# Patient Record
Sex: Male | Born: 1945 | Race: White | Hispanic: No | Smoking: Former smoker
Health system: Southern US, Community
[De-identification: ages and names within clinical notes are randomized; demographics above are authoritative.]

## PROBLEM LIST (undated history)

## (undated) DIAGNOSIS — I714 Abdominal aortic aneurysm, without rupture: Secondary | ICD-10-CM

## (undated) DIAGNOSIS — I2510 Atherosclerotic heart disease of native coronary artery without angina pectoris: Secondary | ICD-10-CM

## (undated) DIAGNOSIS — I219 Acute myocardial infarction, unspecified: Secondary | ICD-10-CM

## (undated) DIAGNOSIS — K589 Irritable bowel syndrome without diarrhea: Secondary | ICD-10-CM

## (undated) DIAGNOSIS — K219 Gastro-esophageal reflux disease without esophagitis: Secondary | ICD-10-CM

## (undated) DIAGNOSIS — J189 Pneumonia, unspecified organism: Secondary | ICD-10-CM

## (undated) DIAGNOSIS — M1990 Unspecified osteoarthritis, unspecified site: Secondary | ICD-10-CM

## (undated) DIAGNOSIS — J61 Pneumoconiosis due to asbestos and other mineral fibers: Secondary | ICD-10-CM

## (undated) DIAGNOSIS — I10 Essential (primary) hypertension: Secondary | ICD-10-CM

## (undated) DIAGNOSIS — E785 Hyperlipidemia, unspecified: Secondary | ICD-10-CM

## (undated) DIAGNOSIS — R0602 Shortness of breath: Secondary | ICD-10-CM

## (undated) DIAGNOSIS — H409 Unspecified glaucoma: Secondary | ICD-10-CM

## (undated) DIAGNOSIS — G9050 Complex regional pain syndrome I, unspecified: Secondary | ICD-10-CM

## (undated) DIAGNOSIS — K5792 Diverticulitis of intestine, part unspecified, without perforation or abscess without bleeding: Secondary | ICD-10-CM

## (undated) DIAGNOSIS — G90522 Complex regional pain syndrome I of left lower limb: Secondary | ICD-10-CM

## (undated) DIAGNOSIS — I739 Peripheral vascular disease, unspecified: Secondary | ICD-10-CM

## (undated) DIAGNOSIS — K5790 Diverticulosis of intestine, part unspecified, without perforation or abscess without bleeding: Secondary | ICD-10-CM

## (undated) DIAGNOSIS — F329 Major depressive disorder, single episode, unspecified: Secondary | ICD-10-CM

## (undated) DIAGNOSIS — C801 Malignant (primary) neoplasm, unspecified: Secondary | ICD-10-CM

## (undated) DIAGNOSIS — F32A Depression, unspecified: Secondary | ICD-10-CM

## (undated) DIAGNOSIS — K469 Unspecified abdominal hernia without obstruction or gangrene: Secondary | ICD-10-CM

## (undated) HISTORY — DX: Diverticulitis of intestine, part unspecified, without perforation or abscess without bleeding: K57.92

## (undated) HISTORY — DX: Unspecified osteoarthritis, unspecified site: M19.90

## (undated) HISTORY — DX: Depression, unspecified: F32.A

## (undated) HISTORY — PX: OTHER SURGICAL HISTORY: SHX169

## (undated) HISTORY — DX: Diverticulosis of intestine, part unspecified, without perforation or abscess without bleeding: K57.90

## (undated) HISTORY — DX: Major depressive disorder, single episode, unspecified: F32.9

## (undated) HISTORY — DX: Malignant (primary) neoplasm, unspecified: C80.1

## (undated) HISTORY — PX: CORONARY ANGIOPLASTY: SHX604

## (undated) HISTORY — PX: NISSEN FUNDOPLICATION: SHX2091

## (undated) HISTORY — DX: Complex regional pain syndrome I, unspecified: G90.50

## (undated) HISTORY — DX: Shortness of breath: R06.02

## (undated) HISTORY — DX: Gastro-esophageal reflux disease without esophagitis: K21.9

## (undated) HISTORY — PX: CARDIAC CATHETERIZATION: SHX172

## (undated) HISTORY — DX: Unspecified abdominal hernia without obstruction or gangrene: K46.9

## (undated) HISTORY — DX: Pneumoconiosis due to asbestos and other mineral fibers: J61

## (undated) HISTORY — PX: CATARACT EXTRACTION: SUR2

## (undated) HISTORY — DX: Unspecified glaucoma: H40.9

## (undated) HISTORY — PX: HERNIA REPAIR: SHX51

## (undated) HISTORY — DX: Complex regional pain syndrome i of left lower limb: G90.522

---

## 1994-10-07 ENCOUNTER — Encounter: Payer: Self-pay | Admitting: Internal Medicine

## 1998-04-12 ENCOUNTER — Ambulatory Visit (HOSPITAL_COMMUNITY): Admission: RE | Admit: 1998-04-12 | Discharge: 1998-04-12 | Payer: Self-pay | Admitting: Orthopedic Surgery

## 1998-05-01 ENCOUNTER — Encounter: Admission: RE | Admit: 1998-05-01 | Discharge: 1998-06-21 | Payer: Self-pay | Admitting: Orthopedic Surgery

## 1998-07-30 ENCOUNTER — Encounter: Admission: RE | Admit: 1998-07-30 | Discharge: 1998-10-28 | Payer: Self-pay | Admitting: Anesthesiology

## 1998-07-31 ENCOUNTER — Encounter: Payer: Self-pay | Admitting: Anesthesiology

## 1998-08-15 ENCOUNTER — Encounter: Admission: RE | Admit: 1998-08-15 | Discharge: 1998-11-13 | Payer: Self-pay | Admitting: Anesthesiology

## 2003-11-02 ENCOUNTER — Ambulatory Visit (HOSPITAL_COMMUNITY): Admission: RE | Admit: 2003-11-02 | Discharge: 2003-11-02 | Payer: Self-pay | Admitting: Internal Medicine

## 2004-09-24 ENCOUNTER — Ambulatory Visit: Payer: Self-pay | Admitting: Internal Medicine

## 2004-09-27 ENCOUNTER — Ambulatory Visit: Payer: Self-pay | Admitting: Internal Medicine

## 2004-10-03 ENCOUNTER — Ambulatory Visit: Payer: Self-pay | Admitting: Internal Medicine

## 2005-04-21 ENCOUNTER — Ambulatory Visit: Payer: Self-pay | Admitting: Internal Medicine

## 2005-11-20 ENCOUNTER — Ambulatory Visit: Payer: Self-pay | Admitting: Internal Medicine

## 2006-02-17 ENCOUNTER — Ambulatory Visit: Payer: Self-pay | Admitting: Internal Medicine

## 2006-02-17 ENCOUNTER — Inpatient Hospital Stay (HOSPITAL_COMMUNITY): Admission: EM | Admit: 2006-02-17 | Discharge: 2006-03-02 | Payer: Self-pay | Admitting: Emergency Medicine

## 2006-02-18 ENCOUNTER — Encounter (INDEPENDENT_AMBULATORY_CARE_PROVIDER_SITE_OTHER): Payer: Self-pay | Admitting: Interventional Cardiology

## 2006-02-23 ENCOUNTER — Encounter (INDEPENDENT_AMBULATORY_CARE_PROVIDER_SITE_OTHER): Payer: Self-pay | Admitting: *Deleted

## 2006-03-12 ENCOUNTER — Encounter
Admission: RE | Admit: 2006-03-12 | Discharge: 2006-03-12 | Payer: Self-pay | Admitting: Thoracic Surgery (Cardiothoracic Vascular Surgery)

## 2006-06-18 ENCOUNTER — Ambulatory Visit: Payer: Self-pay | Admitting: Internal Medicine

## 2007-01-14 ENCOUNTER — Ambulatory Visit: Payer: Self-pay | Admitting: Internal Medicine

## 2007-03-11 ENCOUNTER — Ambulatory Visit: Payer: Self-pay | Admitting: Internal Medicine

## 2007-03-25 ENCOUNTER — Ambulatory Visit: Payer: Self-pay | Admitting: Internal Medicine

## 2007-05-29 DIAGNOSIS — G9050 Complex regional pain syndrome I, unspecified: Secondary | ICD-10-CM | POA: Insufficient documentation

## 2007-05-29 DIAGNOSIS — J45909 Unspecified asthma, uncomplicated: Secondary | ICD-10-CM | POA: Insufficient documentation

## 2007-05-29 DIAGNOSIS — K219 Gastro-esophageal reflux disease without esophagitis: Secondary | ICD-10-CM | POA: Insufficient documentation

## 2007-08-12 DIAGNOSIS — J449 Chronic obstructive pulmonary disease, unspecified: Secondary | ICD-10-CM

## 2007-08-12 DIAGNOSIS — J4489 Other specified chronic obstructive pulmonary disease: Secondary | ICD-10-CM | POA: Insufficient documentation

## 2007-08-12 DIAGNOSIS — J61 Pneumoconiosis due to asbestos and other mineral fibers: Secondary | ICD-10-CM | POA: Insufficient documentation

## 2007-08-19 ENCOUNTER — Encounter: Payer: Self-pay | Admitting: Internal Medicine

## 2007-08-23 ENCOUNTER — Ambulatory Visit: Payer: Self-pay | Admitting: Internal Medicine

## 2007-08-23 DIAGNOSIS — F17200 Nicotine dependence, unspecified, uncomplicated: Secondary | ICD-10-CM | POA: Insufficient documentation

## 2008-01-05 ENCOUNTER — Telehealth: Payer: Self-pay | Admitting: Internal Medicine

## 2008-09-27 ENCOUNTER — Encounter: Payer: Self-pay | Admitting: Internal Medicine

## 2008-12-05 ENCOUNTER — Ambulatory Visit: Payer: Self-pay | Admitting: Internal Medicine

## 2008-12-12 ENCOUNTER — Ambulatory Visit: Payer: Self-pay | Admitting: Internal Medicine

## 2008-12-12 DIAGNOSIS — R0609 Other forms of dyspnea: Secondary | ICD-10-CM | POA: Insufficient documentation

## 2008-12-12 DIAGNOSIS — R0989 Other specified symptoms and signs involving the circulatory and respiratory systems: Secondary | ICD-10-CM

## 2008-12-25 ENCOUNTER — Telehealth: Payer: Self-pay | Admitting: Internal Medicine

## 2009-06-18 ENCOUNTER — Ambulatory Visit: Payer: Self-pay | Admitting: Internal Medicine

## 2009-10-08 ENCOUNTER — Telehealth: Payer: Self-pay | Admitting: Internal Medicine

## 2009-10-12 ENCOUNTER — Telehealth (INDEPENDENT_AMBULATORY_CARE_PROVIDER_SITE_OTHER): Payer: Self-pay | Admitting: *Deleted

## 2009-12-19 ENCOUNTER — Ambulatory Visit (HOSPITAL_COMMUNITY): Admission: RE | Admit: 2009-12-19 | Discharge: 2009-12-19 | Payer: Self-pay | Admitting: Family Medicine

## 2009-12-19 ENCOUNTER — Inpatient Hospital Stay (HOSPITAL_COMMUNITY): Admission: EM | Admit: 2009-12-19 | Discharge: 2009-12-25 | Payer: Self-pay | Admitting: Emergency Medicine

## 2009-12-24 ENCOUNTER — Telehealth: Payer: Self-pay | Admitting: Internal Medicine

## 2010-01-17 ENCOUNTER — Ambulatory Visit: Payer: Self-pay | Admitting: Internal Medicine

## 2010-01-20 LAB — CONVERTED CEMR LAB
CO2: 30 meq/L (ref 19–32)
Calcium: 9.6 mg/dL (ref 8.4–10.5)
Creatinine, Ser: 1.4 mg/dL (ref 0.4–1.5)
Sodium: 136 meq/L (ref 135–145)

## 2010-01-21 ENCOUNTER — Ambulatory Visit: Payer: Self-pay | Admitting: Internal Medicine

## 2010-01-28 ENCOUNTER — Telehealth (INDEPENDENT_AMBULATORY_CARE_PROVIDER_SITE_OTHER): Payer: Self-pay | Admitting: *Deleted

## 2010-03-27 ENCOUNTER — Telehealth: Payer: Self-pay | Admitting: Internal Medicine

## 2010-04-01 ENCOUNTER — Telehealth: Payer: Self-pay | Admitting: Internal Medicine

## 2010-04-23 ENCOUNTER — Ambulatory Visit: Payer: Self-pay | Admitting: Internal Medicine

## 2010-05-09 ENCOUNTER — Ambulatory Visit: Admit: 2010-05-09 | Payer: Self-pay | Admitting: Internal Medicine

## 2010-06-04 NOTE — Progress Notes (Signed)
  Phone Note Other Incoming   Request: Send information Summary of Call: Request for records received from [employer]. Request forwarded to [employer].     

## 2010-06-04 NOTE — Procedures (Signed)
Summary: EGD   EGD  Procedure date:  10/03/2004  Findings:      Location: [facility]   Patient Name: Curtis [name], Curtis [name] MRN:  Procedure Procedures: Panendoscopy (EGD) CPT: 43235.    with biopsy(s)/brushing(s). CPT: D1846139.  Personnel: Endoscopist: Ralpheal Zappone L. Juanda Chance, MD.  Referred By: Benedetto Goad, MD.  Exam Location: Exam performed in Outpatient Clinic. Outpatient  Patient Consent: Procedure, Alternatives, Risks and Benefits discussed, consent obtained, from patient. Consent was obtained by the RN.  Indications Symptoms: Dysphagia. Reflux symptoms for <1 yr,  Surveillance of: 65.  History  Current Medications: Patient is not currently taking Coumadin.  Pre-Exam Physical: Performed Oct 03, 2004  Entire physical exam was normal.  Exam Exam Info: Maximum depth of insertion Duodenum, intended Duodenum. Vocal cords visualized. Gastric retroflexion performed. ASA Classification: I. Tolerance: good.  Sedation Meds: Patient assessed and found to be appropriate for moderate (conscious) sedation. Fentanyl 100 mcg. given IV. Versed 12 given IV. Cetacaine Spray 2 sprays given aerosolized.  Monitoring: BP and pulse monitoring done. Oximetry used. Supplemental O2 given  Findings - PRIOR SURGERY: Distal Esophagus. Anti-Reflux Surgery. Comments: functioninf Nissen Fundoplication, no obvious stricture.  - Dilation: Body. Maloney dilator used, Diameter: 54 mm, Minimal Resistance, No Heme present on extraction. 1  total dilators used. Patient tolerance fair. Outcome: successful.  - FOREIGN BODY / RETAINED FOOD: found in Body. Comments: bile retention.  DIAGNOSTIC TEST: from Antrum. RUT done, results pending   Assessment Abnormal examination, see findings above.  Comments: s/p prior Nissen Fundoplication, s/p passage of 63F Maloney dilator Events  Unplanned Intervention: No unplanned interventions were required.  Unplanned Events: There were no  complications. Plans Medication(s): PPI: Omeprazole/Prilosec 20 mg QAM, starting Oct 03, 2004   Disposition: After procedure patient sent to recovery. After recovery patient sent home.   This report was created from the original endoscopy report, which was reviewed and signed by the above listed endoscopist.    RUT NEGATIVE

## 2010-06-04 NOTE — Progress Notes (Signed)
Summary: REC COL  Phone Note Outgoing Call Call back at [facility] Phone [phone]   Call placed by: Vallarie Mare,  March 27, 2010 10:05 AM Call placed to: Patient Summary of Call: pt was called at home number on 11/14, 11/15, and again on 11/23... detailed voicemails where left all three times for pt to c/b and sch REC COL... pt's contact (son) was also called on 11/15 and a detailed voicemail was left to have pt c/b and sch REC COL. However, there has been no return call. Initial call taken by: Vallarie Mare,  March 27, 2010 10:07 AM  Follow-up for Phone Call        I will forward note to Dr Juanda Chance so she will be aware. Follow-up by: Lamona Curl CMA Duncan Dull),  March 27, 2010 10:25 AM  Additional Follow-up for Phone Call Additional follow up Details #1::        OK, he will call us eventually Additional Follow-up by: Hart Carwin MD,  March 27, 2010 2:11 PM

## 2010-06-04 NOTE — Progress Notes (Signed)
Summary: colonoscopy  Phone Note Call from Patient Call back at Home Phone [phone]   Caller: Patient Call For: Dr. Juanda Chance Reason for Call: Talk to Nurse Details for Reason: Colonoscopy Summary of Call: Pt. called and scheduled his colonoscopy and previsit for January. If you have questions or you feel he needs to do it sooner, please call him. Initial call taken by: Schuyler Amor,  April 01, 2010 10:37 AM  Follow-up for Phone Call        noted. Follow-up by: Lamona Curl CMA Duncan Dull),  April 01, 2010 10:47 AM

## 2010-06-04 NOTE — Assessment & Plan Note (Signed)
Summary: REFILL OMEPRAZOLE...AS.   History of Present Illness Visit Type: Follow-up Visit Primary GI MD: Lina Sar MD Primary Provider: Benedetto Goad, MD  Requesting Provider: n/a Chief Complaint: Omeprazole refiiled. Pt c/o diarrhea  History of Present Illness:   This is a 65 year old white male with gastroesophageal reflux disease who comes for refills of omeprazole 20 mg daily. He underwent a Nissen fundoplication in 1994 as well as a cholecystectomy. His last upper endoscopy in 2006 showed a functioning Nissen fundoplication. He was dilated with a 54 [place] dilator. 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 His last colonoscopy in June 1996 showed diverticulosis of the left colon. Patient has been treated for irritable bowel syndrome with predominant diarrhea. He has been diagnosed with asbestosis and reflex sympathetic dystrophy. He has a family history of colon cancer in his mother.   GI Review of Systems      Denies abdominal pain, acid reflux, belching, bloating, chest pain, dysphagia with liquids, dysphagia with solids, heartburn, loss of appetite, nausea, vomiting, vomiting blood, weight loss, and  weight gain.      Reports diarrhea.     Denies anal fissure, black tarry stools, change in bowel habit, constipation, diverticulosis, fecal incontinence, heme positive stool, hemorrhoids, irritable bowel syndrome, jaundice, light color stool, liver problems, rectal bleeding, and  rectal pain.    Current Medications (verified): 1)  Bayer Aspirin Ec Low Dose 81 Mg  Tbec (Aspirin) .... Take 1 Tablet By Mouth Once A Day 2)  Amitriptyline Hcl 50 Mg  Tabs (Amitriptyline Hcl) .... Take 1 Tablet By Mouth Four Times A Day 3)  Alprazolam 2 Mg Tabs (Alprazolam) .... One Tablet By Mouth Three Times A Day 4)  Omeprazole 20 Mg  Cpdr (Omeprazole) .... Take 1 Tablet By Mouth Once A Day. Need Office Visit For Any Further Refills! 5)  Gabapentin 300 Mg  Tabs (Gabapentin) .... Take 1 Tablet By Mouth Three Times A  Day Every Monday, Wednesday and Friday 6)  Flomax 0.4 Mg  Cp24 (Tamsulosin Hcl) .... Take 1 Capsule By Mouth Once A Day 7)  Metoprolol Tartrate 50 Mg  Tabs (Metoprolol Tartrate) .... One Tablet Two Times Daily 8)  Finasteride 5 Mg  Tabs (Finasteride) .... Take 1 Tablet By Mouth Once A Day  Allergies (verified): 1)  ! Codeine  Past History:  Past Medical History: Reviewed history from 05/29/2007 and no changes required. 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 NIssen Fundoplication Right Elbow Surgery x 2 Right Knee arthroscopy Left shoulder tumor x 3 Squamous cell skin cancer- left hand  Family History: Reviewed history from 11/15/2008 and no changes required. Family History of Colon Cancer: Mother, 10 maternal aunts and uncles  Social History: Reviewed history from 12/05/2008 and no changes required. Patient states former smoker. ( almost 50 pk yrs) Alcohol Use - yes-occasional Illicit Drug Use - no  Review of Systems  The patient denies allergy/sinus, anemia, anxiety-new, arthritis/joint pain, back pain, blood in urine, breast changes/lumps, change in vision, confusion, cough, coughing up blood, depression-new, fainting, fatigue, fever, headaches-new, hearing problems, heart murmur, heart rhythm changes, itching, muscle pains/cramps, night sweats, nosebleeds, shortness of breath, skin rash, sleeping problems, sore throat, swelling of feet/legs, swollen lymph glands, thirst - excessive, urination - excessive, urination changes/pain, urine leakage, vision changes, and voice change.         Pertinent positive and negative review of systems were noted  in the above HPI. All other ROS was otherwise negative.   Vital Signs:  Patient profile:   65 year old male Height:      69 inches Weight:      201  pounds BMI:     29.79 BSA:     2.07 Pulse rate:   68 / minute Pulse rhythm:   regular BP sitting:   136 / 74  (left arm) Cuff size:   regular  Vitals Entered By: Ok Anis CMA (June 18, 2009 4:59 PM)  Physical Exam  General:  Well developed, well nourished, no acute distress. Eyes:  PERRLA, no icterus. Mouth:  No deformity or lesions, dentition normal. Neck:  Supple; no masses or thyromegaly. Lungs:  Clear throughout to auscultation. Heart:  Regular rate and rhythm; no murmurs, rubs,  or bruits. Abdomen:  protuberant abdomen with normal active bowel sounds, mild tenderness right upper quadrant and epigastrium, lower abdomen is normal, liver edge at costal margin. Well-healed vertical scar in the upper abdomen. Extremities:  No clubbing, cyanosis, edema or deformities noted. Skin:  status post excision of a skin cancer of the left hand on the dorsum.   Impression & Recommendations:  Problem # 1:  Family Hx of COLON CANCER (ICD-153.9) Patient is due for a recall colonoscopy but he is not on ready to schedule it today.We will send recall letter in 6 months.  Problem # 2:  GERD (ICD-530.81) controlled on Prilosec 20 mg daily. We will refill x 1 year.  Patient Instructions: 1)  omeprazole 20 mg by mouth once daily #90 with 3 refills. 2)  The medication list was reviewed and reconciled.  All changed / newly prescribed medications were explained.  A complete medication list was provided to the patient / caregiver. 3)  recall for colonoscopy in 6 months Prescriptions: OMEPRAZOLE 20 MG  CPDR (OMEPRAZOLE) Take 1 tablet by mouth once a day.  #90 x 3   Entered by:   Hortense Ramal CMA (AAMA)   Authorized by:   Hart Carwin MD   Signed by:   Hortense Ramal CMA (AAMA) on 06/18/2009   Method used:   Electronically to        [facility]* (mail-order)       [address]       [place], [place]  [address]       Ph: [phone]       Fax: [phone]   RxID:    [id number]

## 2010-06-04 NOTE — Assessment & Plan Note (Signed)
Summary: rov ///kp   Copy to:  n/a Primary Provider/Referring Provider:  Benedetto Goad, MD   CC:  Follow up visit-Bronchitis(not much endurance anymore)..  History of Present Illness: His pulmonary function tests are reviewed today.  There is mild to moderate restriction with a total lung capacity 65% of predicted.  He is not air trapping.  Spirometry flows are reduced parallel to the restriction due to obstructive airways disease as confirmed by disproportionate slowing in small airway flows.  There is no significant response to bronchodilator.  Diffusion capacity is mildly reduced at 65%.  12/05/08- Asbestos plaques Had squamous cell skin cancer removed from left hand. Breathing ok at rest and asleep. rarely coughs up some mucus plugs. Denies blood, adenopathy or chest pain. His [employer] suggested cxr and PFT.  Notes dyspnea with exertion still- not progressive and he paces himself.  We discussed radiation, cost and information yield comparing CT with CXR' Quit smoking 3 years ago.  January 17, 2010- Asbestos plaques Says his [employer] attorneys don't have documentation to say he has asbestos disease. Hosp for diverticulitis resolved with antibiotics. Breathing stable. Gives out easily walking.  CXR- 2010 - Scarring on right. Bilateral interstitial disease c/w asbestos was not seen. PFT 2010 Moderate obstructive and restrictive disease w/o response to bronchodilator. 6 MWT 2010- 94%, 96%, 97% - limited by arthritis pain in legs.      Preventive Screening-Counseling & Management  Alcohol-Tobacco     Smoking Status: quit     Year Quit: 2007     Pack years: 50 years  2 1/2 packs daily  Current Medications (verified): 1)  Bayer Aspirin Ec Low Dose 81 Mg  Tbec (Aspirin) .... Take 1 Tablet By Mouth Once A Day 2)  Amitriptyline Hcl 50 Mg  Tabs (Amitriptyline Hcl) .... Take 1 Tablet By Mouth Four Times A Day 3)  Alprazolam 2 Mg Tabs (Alprazolam) .... One Tablet By  Mouth Three Times A Day 4)  Omeprazole 20 Mg  Cpdr (Omeprazole) .... Take 1 Tablet By Mouth Once A Day. 5)  Neurontin 300 Mg Caps (Gabapentin) .... Take 1 By Mouth Two Times A Day 6)  Flomax 0.4 Mg  Cp24 (Tamsulosin Hcl) .... Take 1 Capsule By Mouth Once A Day 7)  Metoprolol Tartrate 50 Mg  Tabs (Metoprolol Tartrate) .... One Tablet Two Times Daily 8)  Finasteride 5 Mg  Tabs (Finasteride) .... Take 1 Tablet By Mouth Once A Day 9)  Ra Vitamin E 4000 Unit Oil (Vitamin E) .... Once Daily 10)  Tylenol Arthritis Pain 650 Mg Cr-Tabs (Acetaminophen) .... Take 1 By Mouth Every 4 Hours As Needed 11)  Amlodipine Besylate 10 Mg Tabs (Amlodipine Besylate) .... Take 1 By Mouth Once Daily 12)  Hydrocodone-Acetaminophen 5-325 Mg Tabs (Hydrocodone-Acetaminophen) .... Take 1-2 By Mouth Every 6 Hours As Needed  Allergies (verified): 1)  ! 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  The patient denies anorexia, fever, weight loss, weight gain, vision loss, decreased hearing, hoarseness, chest pain, syncope, peripheral edema, prolonged cough, headaches, hemoptysis, abdominal pain, enlarged lymph nodes, and angioedema.    Vital Signs:  Patient profile:   65 year  old male Height:      69 inches Weight:      180.50 pounds BMI:     26.75 O2 Sat:      95 % on Room air Pulse rate:   75 / minute BP sitting:   132 / 74  (left arm) Cuff size:   regular  Vitals Entered By: Reynaldo Minium CMA (January 17, 2010 3:49 PM)  O2 Flow:  Room air CC: Follow up visit-Bronchitis(not much endurance anymore).   Physical Exam  Additional Exam:  General: A/Ox3; pleasant and cooperative, NAD, SKIN: no rash, lesions NODES: no lymphadenopathy HEENT: [place]/AT, EOM- WNL,  Conjuctivae- clear, PERRLA, TM-WNL, Nose- clear, Throat- clear and wnl NECK: Supple w/ fair ROM, JVD- none, normal carotid impulses w/o bruits Thyroid-  CHEST: Diminished with poor basilar airflow, fine crackles in L>R lower zones. No rub or dullness HEART: RRR, no m/g/r heard ABDOMEN: Soft and nl;  ZOX:WRUE, nl pulses, no edema  NEURO: Grossly intact to observation, slow talking, calm and cooperative      CXR  Procedure date:  12/05/2008  Findings:       Comparison: 03/12/2006   Findings: Heart size is normal.   No pleural effusions or interstitial edema noted.   Extensive pleural and parenchymal changes involving the right lung base is again noted and appears unchanged.   No superimposed airspace consolidation identified.  There is interstitial change of COPD.   IMPRESSION:   1.  No change in pleural parenchymal scarring on the right. 2.  No acute cardiopulmonary abnormalities.   CXR  Procedure date:  12/05/2008  Findings:      DG CHEST 2 VIEW - [id number]   Clinical Data: History of asbestos exposure and COPD   CHEST - 2 VIEW   Comparison: 03/12/2006   Findings: Heart size is normal.   No pleural effusions or interstitial edema noted.   Extensive pleural and parenchymal changes involving the right lung base is again noted and appears unchanged.   No superimposed airspace consolidation identified.  There is interstitial change of COPD.   IMPRESSION:   1.  No change in pleural parenchymal scarring on the right. 2.  No acute cardiopulmonary abnormalities.   Read By:    Impression & Recommendations:  Problem # 1:  HISTORY OF ASBESTOS EXPOSURE (ICD-V15.84)  Asymmetrical scarring just in right lung suggests a post pneumonia scarring. I would expect asbestos exposure to be more symmetrical. He had hx double pneumonia in [employer], 1966. We will get CT chest trying to clarify pattern consistent with asbestos disease. He was outspoken today with concern  about the status of his asbestos claim.  Problem # 2:  COPD (ICD-496) A component of his lung disease has to reflect his sustained heavy smoking, but there is no way to separate out the proportions due to COPD vs pneumonia vs asbestosis if any. CT may help.  Medications Added to Medication List This Visit: 1)  Neurontin 300 Mg Caps (Gabapentin) .... Take 1 by mouth two times a day 2)  Ra Vitamin E 4000 Unit Oil (Vitamin e) .... Once daily 3)  Tylenol Arthritis Pain 650 Mg Cr-tabs (Acetaminophen) .... Take 1 by mouth every 4 hours as needed 4)  Amlodipine Besylate 10 Mg Tabs (Amlodipine besylate) .... Take 1 by mouth once daily 5)  Hydrocodone-acetaminophen 5-325 Mg Tabs (Hydrocodone-acetaminophen) .... Take 1-2 by mouth every 6 hours as needed  Other Orders: Est. Patient Level IV (91478) TLB-BMP (Basic Metabolic Panel-BMET) (80048-METABOL) Radiology Referral (Radiology)  Patient Instructions: 1)  Please schedule a follow-up appointment in 2 months. 2)  A Chest CT with Contrast has been recommended.  Your imaging study may require preauthorization.  3)  Lab   CXR  Procedure date:  12/05/2008  Findings:       Comparison: 03/12/2006   Findings: Heart size is normal.   No pleural effusions or interstitial edema noted.   Extensive pleural and parenchymal changes involving the right lung base is again noted and appears unchanged.   No superimposed airspace consolidation identified.  There is interstitial change of COPD.   IMPRESSION:   1.  No change in pleural parenchymal scarring on the right. 2.  No acute cardiopulmonary abnormalities.   CXR  Procedure date:  12/05/2008  Findings:      DG CHEST 2 VIEW - [id number]   Clinical Data: History of asbestos exposure and COPD   CHEST - 2 VIEW   Comparison: 03/12/2006   Findings: Heart size is normal.   No pleural effusions or interstitial edema noted.   Extensive pleural and parenchymal changes involving the right  lung base is again noted and appears unchanged.   No superimposed airspace consolidation identified.  There is interstitial change of COPD.   IMPRESSION:   1.  No change in pleural parenchymal scarring on the right. 2.  No acute cardiopulmonary abnormalities.   Read By:

## 2010-06-04 NOTE — Progress Notes (Signed)
Summary: diagnosis/ copy req  Phone Note Call from Patient Call back at Home Phone [phone]   Caller: Patient Call For: young Summary of Call: pt requests a copy of the diagnosis re: "asbestosis". he says dr young diagnosed him in '07. needs this mailed to his home address.  Initial call taken by: Tivis Ringer, CNA,  October 08, 2009 9:40 AM  Follow-up for Phone Call        [facility] Yetta Barre RN  October 08, 2009 9:48 AM  pt staets he needs his records to be sent to lawyers [employer]. I asked if he had signed a release adn he stated no, so I advised he would need to sign a release form from medical records and have them collect his records to sedn to the lawyer. Pt states he will do so. Carron Curie CMA  October 09, 2009 3:01 PM

## 2010-06-04 NOTE — Progress Notes (Signed)
Summary: FYI- pt in hosp  Phone Note Call from Patient   Caller: Patient Call For: Broedy Osbourne Summary of Call: pt wanted me to let CDY know he was in hospital @ [facility]. Initial call taken by: Eugene Gavia,  December 24, 2009 9:07 AM  Follow-up for Phone Call        called and spoke with pt. pt states he wants CY to know he is at  [facility] 5th floor 1529 d/t IBS and diverticulitis.  Been at [facility] since Tuesday 12-18-2009.  Pt states he is unsure when he will be discharged.  Will forward message to CY as an FYI.  Arman Filter LPN  December 24, 2009 9:16 AM  Follow-up by: Waymon Budge MD,  December 24, 2009 1:45 PM  Additional Follow-up for Phone Call Additional follow up Details #1::        noted Additional Follow-up by: Waymon Budge MD,  December 24, 2009 1:45 PM

## 2010-06-12 ENCOUNTER — Ambulatory Visit: Payer: Self-pay | Admitting: Internal Medicine

## 2010-06-17 ENCOUNTER — Telehealth: Payer: Self-pay | Admitting: Internal Medicine

## 2010-06-18 ENCOUNTER — Encounter (INDEPENDENT_AMBULATORY_CARE_PROVIDER_SITE_OTHER): Payer: Self-pay | Admitting: *Deleted

## 2010-06-26 ENCOUNTER — Encounter (INDEPENDENT_AMBULATORY_CARE_PROVIDER_SITE_OTHER): Payer: Self-pay | Admitting: *Deleted

## 2010-06-26 NOTE — Progress Notes (Signed)
Summary: Omeprazole Rx/Needs Colonoscopy  Phone Note Outgoing Call   Call placed by: Lamona Curl CMA Duncan Dull),  June 17, 2010 5:57 PM Call placed to: Patient Summary of Call: I have advised patient that we have received his request for more omeprazole via mail. However, he is overdue for colonoscopy. He has a strong family history of colon cancer in his mother and patient has had difficulty with Diverticulitis as recent as August of this year. He was originally scheduled for colonoscopy but called and cancelled. I have explained to patient that we highly recommend him to have a colonoscopy due to his family history and personal history. Patient states "well I had a CT scan and they told me that there werent any other problems." I have explained that a CT scan would not show any colon polyps. I tried to explain the difference in what a CT would show vs what a colonoscopy would show. Patient continues to explain that his CT scan was okay. Patient states that he does not wish to have colonoscopy at this time. I have spoken with Dr Juanda Chance directly after conversation with patient. She states that it is still okay for patient to have omeprazole for reflux. Initial call taken by: Lamona Curl CMA (AAMA),  June 17, 2010 6:01 PM    Prescriptions: OMEPRAZOLE 20 MG  CPDR (OMEPRAZOLE) Take 1 tablet by mouth once a day.  #90 x 0   Entered by:   Lamona Curl CMA (AAMA)   Authorized by:   Hart Carwin MD   Signed by:   Lamona Curl CMA (AAMA) on 06/17/2010   Method used:   Electronically to        [facility]* (mail-order)       [address]       [place], [place]  [address]       Ph: [phone]       Fax: [phone]   RxID:   [id number]

## 2010-06-26 NOTE — Letter (Signed)
Summary: Pre Visit Letter Revised  [facility]  [address] [place], [place] [address]   Phone: [phone]  Fax: [phone]        06/18/2010 MRN: [id number] [facility] [address] [place], [place]  [address]             Procedure Date:  07-12-10   Welcome to the Gastroenterology Division at [facility].    You are scheduled to see a nurse for your pre-procedure visit on 06-28-10 at 1:30P.M. on the 3rd floor at [facility], [address].  We ask that you try to arrive at our office 15 minutes prior to your appointment time to allow for check-in.  Please take a minute to review the attached form.  If you answer "Yes" to one or more of the questions on the first page, we ask that you call the person listed at your earliest opportunity.  If you answer "No" to all of the questions, please complete the rest of the form and bring it to your appointment.    Your nurse visit will consist of discussing your medical and surgical history, your immediate family medical history, and your medications.   If you are unable to list all of your medications on the form, please bring the medication bottles to your appointment and we will list them.  We will need to be aware of both prescribed and over the counter drugs.  We will need to know exact dosage information as well.    Please be prepared to read and sign documents such as consent forms, a financial agreement, and acknowledgement forms.  If necessary, and with your consent, a friend or relative is welcome to sit-in on the nurse visit with you.  Please bring your insurance card so that we may make a copy of it.  If your insurance requires a referral to see a specialist, please bring your referral form from your primary care physician.  No co-pay is required for this nurse visit.     If you cannot keep your appointment, please call [phone] to cancel or reschedule prior to your appointment date.  This  allows [place] the opportunity to schedule an appointment for another patient in need of care.    Thank you for choosing  Gastroenterology for your medical needs.  We appreciate the opportunity to care for you.  Please visit [place] at our website  to learn more about our practice.  Sincerely, The Gastroenterology Division

## 2010-06-26 NOTE — Letter (Signed)
Summary: Pre Visit Letter Revised  [facility]  [address] [place], [place] [address]   Phone: [phone]  Fax: [phone]        06/18/2010 MRN: [id number] [facility] [address] [place], [place]  [address]             Procedure Date:  07-23-10   Welcome to the Gastroenterology Division at [facility].    You are scheduled to see a nurse for your pre-procedure visit on 06-28-10 at 1:30P.M. on the 3rd floor at [facility], [address]  We ask that you try to arrive at our office 15 minutes prior to your appointment time to allow for check-in.  Please take a minute to review the attached form.  If you answer "Yes" to one or more of the questions on the first page, we ask that you call the person listed at your earliest opportunity.  If you answer "No" to all of the questions, please complete the rest of the form and bring it to your appointment.    Your nurse visit will consist of discussing your medical and surgical history, your immediate family medical history, and your medications.   If you are unable to list all of your medications on the form, please bring the medication bottles to your appointment and we will list them.  We will need to be aware of both prescribed and over the counter drugs.  We will need to know exact dosage information as well.    Please be prepared to read and sign documents such as consent forms, a financial agreement, and acknowledgement forms.  If necessary, and with your consent, a friend or relative is welcome to sit-in on the nurse visit with you.  Please bring your insurance card so that we may make a copy of it.  If your insurance requires a referral to see a specialist, please bring your referral form from your primary care physician.  No co-pay is required for this nurse visit.     If you cannot keep your appointment, please call [phone] to cancel or reschedule prior to your appointment date.  This  allows [place] the opportunity to schedule an appointment for another patient in need of care.    Thank you for choosing [facility] for your medical needs.  We appreciate the opportunity to care for you.  Please visit [place] at our website  to learn more about our practice.  Sincerely, The Gastroenterology Division

## 2010-06-28 ENCOUNTER — Encounter: Payer: Self-pay | Admitting: Internal Medicine

## 2010-07-02 NOTE — Miscellaneous (Signed)
Summary: [facility] PV  Clinical Lists Changes  Medications: Added new medication of MOVIPREP 100 GM  SOLR (PEG-KCL-NACL-NASULF-NA ASC-C) As per prep instructions. - Signed Rx of MOVIPREP 100 GM  SOLR (PEG-KCL-NACL-NASULF-NA ASC-C) As per prep instructions.;  #1 x 0;  Signed;  Entered by: Ezra Sites RN;  Authorized by: Hart Carwin MD;  Method used: [facility]  [place] [address], [place], [place]  [address], Ph: [phone] or [phone], Fax: [phone] Observations: Added new observation of ALLERGY REV: Done (06/28/2010 12:30)    Prescriptions: MOVIPREP 100 GM  SOLR (PEG-KCL-NACL-NASULF-NA ASC-C) As per prep instructions.  #1 x 0   Entered by:   Ezra Sites RN   Authorized by:   Hart Carwin MD   Signed by:   Ezra Sites RN on 06/28/2010   Method used:   [facility]ically [facility]        [facility]  [place] [address] (retail)       [address]       [place], [place]  [address]       Ph: [phone] or [phone]       Fax: [phone]   RxID:   [id number]

## 2010-07-02 NOTE — Letter (Signed)
Summary: Moviprep Instructions  [facility]  [address]   [place], [place] [address]   Phone: [phone]  Fax: [phone]       Curtis [name]    May 31, 1945    MRN: [id number]        Procedure Day Dorna Bloom: Tuesday, 07-23-10     Arrival Time: 1:00 p.m.      Procedure Time: 2:00 p.m.     Location of Procedure:                    x  [facility] (4th Floor)  PREPARATION FOR COLONOSCOPY WITH MOVIPREP   Starting 5 days prior to your procedure 07-18-10 do not eat nuts, seeds, popcorn, corn, beans, peas,  salads, or any raw vegetables.  Do not take any fiber supplements (e.g. Metamucil, Citrucel, and Benefiber).  THE DAY BEFORE YOUR PROCEDURE         DATE: 07-22-10   DAY: Monday  1.  Drink clear liquids the entire day-NO SOLID FOOD  2.  Do not drink anything colored red or purple.  Avoid juices with pulp.  No orange juice.  3.  Drink at least 64 oz. (8 glasses) of fluid/clear liquids during the day to prevent dehydration and help the prep work efficiently.  CLEAR LIQUIDS INCLUDE: Water Jello Ice Popsicles Tea (sugar ok, no milk/cream) Powdered fruit flavored drinks Coffee (sugar ok, no milk/cream) Gatorade Juice: apple, white grape, white cranberry  Lemonade Clear bullion, consomm, broth Carbonated beverages (any kind) Strained chicken noodle soup Hard Candy                             4.  In the morning, mix first dose of MoviPrep solution:    Empty 1 Pouch A and 1 Pouch B into the disposable container    Add lukewarm drinking water to the top line of the container. Mix to dissolve    Refrigerate (mixed solution should be used within 24 hrs)  5.  Begin drinking the prep at 5:00 p.m. The MoviPrep container is divided by 4 marks.   Every 15 minutes drink the solution down to the next mark (approximately 8 oz) until the full liter is complete.   6.  Follow completed prep with 16 oz of clear liquid of your choice (Nothing red or purple).   Continue to drink clear liquids until bedtime.  7.  Before going to bed, mix second dose of MoviPrep solution:    Empty 1 Pouch A and 1 Pouch B into the disposable container    Add lukewarm drinking water to the top line of the container. Mix to dissolve    Refrigerate  THE DAY OF YOUR PROCEDURE      DATE: 07-23-10  DAY: Tuesday  Beginning at 9:00 a.m. (5 hours before procedure):         1. Every 15 minutes, drink the solution down to the next mark (approx 8 oz) until the full liter is complete.  2. Follow completed prep with 16 oz. of clear liquid of your choice.    3. You may drink clear liquids until 12:00 p.m. (2 HOURS BEFORE PROCEDURE).   MEDICATION INSTRUCTIONS  Unless otherwise instructed, you should take regular prescription medications with a small sip of water   as early as possible the morning of your procedure.        OTHER INSTRUCTIONS  You will need a responsible adult at least  66 years of age to accompany you and drive you home.   This person must remain in the waiting room during your procedure.  Wear loose fitting clothing that is easily removed.  Leave jewelry and other valuables at home.  However, you may wish to bring a book to read or  an iPod/MP3 player to listen to music as you wait for your procedure to start.  Remove all body piercing jewelry and leave at home.  Total time from sign-in until discharge is approximately 2-3 hours.  You should go home directly after your procedure and rest.  You can resume normal activities the  day after your procedure.  The day of your procedure you should not:   Drive   Make legal decisions   Operate machinery   Drink alcohol   Return to work  You will receive specific instructions about eating, activities and medications before you leave.    The above instructions have been reviewed and explained to me by   Ezra Sites RN  June 28, 2010 12:53 PM     I fully understand and can verbalize  these instructions _____________________________ Date _________

## 2010-07-11 ENCOUNTER — Encounter: Payer: Self-pay | Admitting: Internal Medicine

## 2010-07-11 ENCOUNTER — Ambulatory Visit (INDEPENDENT_AMBULATORY_CARE_PROVIDER_SITE_OTHER): Payer: Medicare Other | Admitting: Internal Medicine

## 2010-07-11 DIAGNOSIS — K219 Gastro-esophageal reflux disease without esophagitis: Secondary | ICD-10-CM

## 2010-07-11 DIAGNOSIS — J4489 Other specified chronic obstructive pulmonary disease: Secondary | ICD-10-CM

## 2010-07-11 DIAGNOSIS — J449 Chronic obstructive pulmonary disease, unspecified: Secondary | ICD-10-CM

## 2010-07-11 DIAGNOSIS — J42 Unspecified chronic bronchitis: Secondary | ICD-10-CM

## 2010-07-11 DIAGNOSIS — Z77090 Contact with and (suspected) exposure to asbestos: Secondary | ICD-10-CM

## 2010-07-12 ENCOUNTER — Other Ambulatory Visit: Payer: Self-pay | Admitting: Internal Medicine

## 2010-07-18 ENCOUNTER — Telehealth (INDEPENDENT_AMBULATORY_CARE_PROVIDER_SITE_OTHER): Payer: Self-pay | Admitting: *Deleted

## 2010-07-18 ENCOUNTER — Encounter: Payer: Self-pay | Admitting: Internal Medicine

## 2010-07-19 LAB — CBC
HCT: 30.1 % — ABNORMAL LOW (ref 39.0–52.0)
HCT: 31.6 % — ABNORMAL LOW (ref 39.0–52.0)
Hemoglobin: 10.4 g/dL — ABNORMAL LOW (ref 13.0–17.0)
Hemoglobin: 10.6 g/dL — ABNORMAL LOW (ref 13.0–17.0)
Hemoglobin: 10.7 g/dL — ABNORMAL LOW (ref 13.0–17.0)
MCH: 31 pg (ref 26.0–34.0)
MCH: 31 pg (ref 26.0–34.0)
MCH: 31.3 pg (ref 26.0–34.0)
MCHC: 34.3 g/dL (ref 30.0–36.0)
MCHC: 34.5 g/dL (ref 30.0–36.0)
MCHC: 35.1 g/dL (ref 30.0–36.0)
MCV: 88.7 fL (ref 78.0–100.0)
MCV: 89.9 fL (ref 78.0–100.0)
MCV: 90 fL (ref 78.0–100.0)
MCV: 90.5 fL (ref 78.0–100.0)
Platelets: 313 10*3/uL (ref 150–400)
Platelets: 323 10*3/uL (ref 150–400)
Platelets: 325 10*3/uL (ref 150–400)
RBC: 3.41 MIL/uL — ABNORMAL LOW (ref 4.22–5.81)
RBC: 3.49 MIL/uL — ABNORMAL LOW (ref 4.22–5.81)
RDW: 13.5 % (ref 11.5–15.5)
RDW: 13.7 % (ref 11.5–15.5)
RDW: 14 % (ref 11.5–15.5)
RDW: 14.1 % (ref 11.5–15.5)
WBC: 6.1 10*3/uL (ref 4.0–10.5)
WBC: 6.2 10*3/uL (ref 4.0–10.5)
WBC: 6.8 10*3/uL (ref 4.0–10.5)

## 2010-07-19 LAB — BASIC METABOLIC PANEL
BUN: 8 mg/dL (ref 6–23)
CO2: 24 mEq/L (ref 19–32)
CO2: 28 mEq/L (ref 19–32)
Calcium: 8.5 mg/dL (ref 8.4–10.5)
Calcium: 8.6 mg/dL (ref 8.4–10.5)
Calcium: 9 mg/dL (ref 8.4–10.5)
Chloride: 104 mEq/L (ref 96–112)
Creatinine, Ser: 1.29 mg/dL (ref 0.4–1.5)
GFR calc Af Amer: >60 mL/min (ref >60)
GFR calc non Af Amer: 54 mL/min — ABNORMAL LOW (ref >60)
GFR calc non Af Amer: 56 mL/min — ABNORMAL LOW (ref >60)
Glucose, Bld: 109 mg/dL — ABNORMAL HIGH (ref 70–99)
Glucose, Bld: 116 mg/dL — ABNORMAL HIGH (ref 70–99)
Potassium: 4.7 mEq/L (ref 3.5–5.1)
Sodium: 137 mEq/L (ref 135–145)
Sodium: 137 mEq/L (ref 135–145)

## 2010-07-19 LAB — URINALYSIS, ROUTINE W REFLEX MICROSCOPIC
Bilirubin Urine: NEGATIVE
Nitrite: NEGATIVE
Specific Gravity, Urine: 1.022 (ref 1.005–1.030)
pH: 6 (ref 5.0–8.0)

## 2010-07-19 LAB — CULTURE, BLOOD (ROUTINE X 2): Culture: NO GROWTH

## 2010-07-19 LAB — DIFFERENTIAL
Basophils Absolute: 0.2 10*3/uL — ABNORMAL HIGH (ref 0.0–0.1)
Basophils Relative: 1 % (ref 0–1)
Eosinophils Absolute: 0.3 10*3/uL (ref 0.0–0.7)
Eosinophils Relative: 4 % (ref 0–5)
Lymphocytes Relative: 14 % (ref 12–46)
Monocytes Absolute: 0.8 10*3/uL (ref 0.1–1.0)
Monocytes Absolute: 0.9 10*3/uL (ref 0.1–1.0)
Monocytes Relative: 11 % (ref 3–12)
Monocytes Relative: 11 % (ref 3–12)
Neutro Abs: 5.8 10*3/uL (ref 1.7–7.7)

## 2010-07-19 LAB — CLOSTRIDIUM DIFFICILE EIA: C difficile Toxins A+B, EIA: NEGATIVE

## 2010-07-19 LAB — COMPREHENSIVE METABOLIC PANEL
Albumin: 3 g/dL — ABNORMAL LOW (ref 3.5–5.2)
Albumin: 3.6 g/dL (ref 3.5–5.2)
Alkaline Phosphatase: 69 U/L (ref 39–117)
BUN: 16 mg/dL (ref 6–23)
BUN: 7 mg/dL (ref 6–23)
Calcium: 8.6 mg/dL (ref 8.4–10.5)
Calcium: 9.4 mg/dL (ref 8.4–10.5)
Creatinine, Ser: 1.49 mg/dL (ref 0.4–1.5)
Potassium: 4.3 mEq/L (ref 3.5–5.1)
Total Bilirubin: 0.6 mg/dL (ref 0.3–1.2)
Total Protein: 6.8 g/dL (ref 6.0–8.3)
Total Protein: 7.5 g/dL (ref 6.0–8.3)

## 2010-07-19 LAB — LACTIC ACID, PLASMA: Lactic Acid, Venous: 0.6 mmol/L (ref 0.5–2.2)

## 2010-07-19 LAB — BUN: BUN: 15 mg/dL (ref 6–23)

## 2010-07-19 LAB — CREATININE, SERUM: GFR calc Af Amer: >60 mL/min (ref >60)

## 2010-07-23 ENCOUNTER — Ambulatory Visit: Payer: Medicare Other | Admitting: Internal Medicine

## 2010-07-23 ENCOUNTER — Encounter: Payer: Self-pay | Admitting: Internal Medicine

## 2010-07-23 VITALS — BP 183/87 | HR 73 | Temp 98.2°F | Resp 17

## 2010-07-23 DIAGNOSIS — Z1211 Encounter for screening for malignant neoplasm of colon: Secondary | ICD-10-CM

## 2010-07-23 DIAGNOSIS — K5730 Diverticulosis of large intestine without perforation or abscess without bleeding: Secondary | ICD-10-CM

## 2010-07-23 DIAGNOSIS — Z800 Family history of malignant neoplasm of digestive organs: Secondary | ICD-10-CM

## 2010-07-23 NOTE — Patient Instructions (Addendum)
MILD DIVERTICULOSIS IN THE SIGMOID COLON-HANDOUT GIVEN OTHERWISE NORMAL EXAM  PLEASE EAT A DIET THAT IS HIGH IN FIBER-HANDOUT GIVEN REPEAT EXAM IN 5 [id number]

## 2010-07-23 NOTE — Assessment & Plan Note (Signed)
Summary: 2 mth//kp   Copy to:  n/a Primary Provider/Referring Provider:  Benedetto Goad, MD   CC:  2 month follow up visit-"Good and Bad days-SOB and wheezing"..  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 Breathing ok at rest and asleep. rarely coughs up some mucus plugs. Denies blood, adenopathy or chest pain. His [employer] suggested cxr and PFT.  Notes dyspnea with exertion still- not progressive and he paces himself.  We discussed radiation, cost and information yield comparing CT with CXR' Quit smoking 3 years ago.  January 17, 2010- Asbestos plaques, COPD Says his [employer] attorneys don't have documentation to say he has asbestos disease. Hosp for diverticulitis resolved with antibiotics. Breathing stable. Gives out easily walking.  CXR- 2010 - Scarring on right. Bilateral interstitial disease c/w asbestos was not seen. PFT 2010 Moderate obstructive and restrictive disease w/o response to bronchodilator. 6 MWT 2010- 94%, 96%, 97% - limited by arthritis pain in legs.  July 11, 2010 Asbestos plaques, COPD, CAD, PHTN Nurse-CC: 2 month follow up visit-"Good and Bad days-SOB and wheezing". CT 01/21/10- Plaques, NAD, PHTN. Atherosclerosis. No definite interstial fibrosis of asbestosis per se. Says metered inhalers make him worse. Denies glaucoma and doesn't remember Spiriva trial. Indicates arthritis pain limits activity before dyspnea.        Preventive Screening-Counseling & Management  Alcohol-Tobacco     Smoking Status: quit     Year Quit: 2007     Pack years: 50 years  2 1/2 packs daily  Current Medications (verified): 1)  Bayer Aspirin Ec Low Dose 81 Mg  Tbec (Aspirin) .... Take 1 Tablet By Mouth Once A Day 2)  Amitriptyline Hcl 50 Mg  Tabs (Amitriptyline Hcl) .... Take 1 Tablet By Mouth Four Times A Day 3)  Alprazolam 2 Mg Tabs (Alprazolam) .... One Tablet By Mouth Three Times A  Day 4)  Omeprazole 20 Mg  Cpdr (Omeprazole) .... Take 1 Tablet By Mouth Once A Day. 5)  Neurontin 300 Mg Caps (Gabapentin) .... Take 1 By Mouth Two Times A Day 6)  Flomax 0.4 Mg  Cp24 (Tamsulosin Hcl) .... Take 1 Capsule By Mouth Once A Day 7)  Metoprolol Tartrate 50 Mg  Tabs (Metoprolol Tartrate) .... One Tablet Two Times Daily 8)  Finasteride 5 Mg  Tabs (Finasteride) .... Take 1 Tablet By Mouth Once A Day 9)  Ra Vitamin E 4000 Unit Oil (Vitamin E) .... Once Daily 10)  Amlodipine Besylate 10 Mg Tabs (Amlodipine Besylate) .... Take 1 By Mouth Once Daily 11)  Hydrocodone-Acetaminophen 5-325 Mg Tabs (Hydrocodone-Acetaminophen) .... Take 1-2 By Mouth Every 6 Hours As Needed  Allergies (verified): 1)  ! 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  Past Surgical History: Last updated: 12/05/2008 NIssen Fundoplication Right Elbow Surgery x 2 Right Knee arthroscopy Left shoulder tumor x 3 Squamous cell skin cancer- left hand  Family History: Last updated: 11/15/2008 Family History of Colon Cancer: Mother, 10 maternal aunts and uncles  Social History: Last updated: 07/11/2010 Patient states former smoker. ( almost 50 pk yrs) Alcohol Use - yes-occasional Illicit Drug Use - no Veteran  Risk Factors: Smoking Status: quit (07/11/2010)  Social History: Patient states former smoker. ( almost 50 pk yrs)  Alcohol Use - yes-occasional Illicit Drug Use - no Veteran  Review of Systems      See HPI       The patient complains of shortness of breath with activity and joint stiffness or pain.  The patient denies shortness of breath at rest, productive cough, non-productive cough, coughing up blood, chest pain, irregular heartbeats, acid heartburn, indigestion, loss of appetite, weight change,  abdominal pain, difficulty swallowing, sore throat, tooth/dental problems, headaches, nasal congestion/difficulty breathing through nose, sneezing, itching, ear ache, hand/feet swelling, rash, change in color of mucus, and fever.    Vital Signs:  Patient profile:   65 year old male Height:      69 inches Weight:      205.13 pounds BMI:     30.40 O2 Sat:      95 % on Room air Pulse rate:   68 / minute BP sitting:   120 / 72  (left arm) Cuff size:   regular  Vitals Entered By: Reynaldo Minium CMA (July 11, 2010 9:40 AM)  O2 Flow:  Room air CC: 2 month follow up visit-"Good and Bad days-SOB and wheezing".   Physical Exam  Additional Exam:  General: A/Ox3; pleasant and cooperative, NAD, SKIN: no rash, lesions NODES: no lymphadenopathy HEENT: [place]/AT, EOM- WNL, Conjuctivae- clear, PERRLA, TM-WNL, Nose- clear, Throat- clear and wnl NECK: Supple w/ fair ROM, JVD- none, normal carotid impulses w/o bruits Thyroid-  CHEST: Diminished with poor basilar airflow, fine crackles in L>R lower zones heard again. No rub or dullness HEART: RRR, no m/g/r heard ABDOMEN: Soft and nl;  ZOX:WRUE, nl pulses, no edema  NEURO: Grossly intact to observation, slow talking, calm      CT of Chest  Procedure date:  01/21/2010  Findings:      CT CHEST W/CM - [id number]  Clinical Data: Shortness of breath, fatigue, productive cough, asbestos exposure, and 20 pound weight loss.  CT CHEST WITH CONTRAST  Technique:  Multidetector CT imaging of the chest was performed following the standard protocol during bolus administration of intravenous contrast.  Contrast: 80 ml Omnipaque-300  Comparison: 02/19/2006  Findings: Mediastinal lymph nodes measure up to 12 mm in short axis, anterior to the right mainstem bronchus, stable.  No hilar or axillary adenopathy.  Pulmonary arteries are enlarged.  There is atherosclerotic calcification of the arterial vasculature, including coronary arteries.  Heart  is at the upper limits of normal in size.  No pericardial effusion.  Small juxta diaphragmatic lymph nodes are noted.  There is extensive pleural plaque formation bilaterally, with associated calcification, as before.  Biapical pleural parenchymal scarring.  There is basilar predominant subpleural reticulation, parenchymal bands and volume loss.  No pleural fluid.  Airway is unremarkable.  Incidental imaging of the upper abdomen shows no acute findings. No worrisome lytic or sclerotic lesions.  IMPRESSION:  1.  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 2. Pulmonary arterial hypertension.  Read By:  Reyes Ivan.,  M.D.     Released By:  Reyes Ivan.,  M.D.   Impression & Recommendations:  Problem # 1:  HISTORY OF ASBESTOS EXPOSURE (ICD-V15.84)  Asbestos plaques. We don't see interstitial disease described, though I do hear fine adventious sounds. For continued surveillance.   Problem # 2:  COPD (ICD-496) Significant smoking hx. He denies hx of response to inhaled meds, but we will try Spiriva  and check for a1AT.  He is not  hypoxic now and we will watch the pulmonary hypertension for now.   Problem # 3:  GERD (ICD-530.81)  He has been treated for GERD issues, but is not describing aspiration risk events now.  His updated medication list for this problem includes:    Omeprazole 20 Mg Cpdr (Omeprazole) .[name]... Take 1 tablet by mouth once a day.  Medications Added to Medication List This Visit: 1)  Spiriva Handihaler 18 Mcg Caps (Tiotropium bromide monohydrate) .[name].. 1 daily  Other Orders: Est. Patient Level IV ([address])  Patient Instructions: 1)  Please schedule a follow-up appointment in 6 months. 2)  Sample and script Spiriva  3)      1 daily 4)  a1AT test Prescriptions: SPIRIVA HANDIHALER 18 MCG CAPS (TIOTROPIUM BROMIDE MONOHYDRATE) 1 daily  #90 x 3   Entered and  Authorized by:   Waymon Budge MD   Signed by:   Waymon Budge MD on 07/11/2010   Method used:   Print then Give to Patient   RxID:   [id number]      CT of Chest  Procedure date:  01/21/2010  Findings:      CT CHEST W/CM - [id number]  Clinical Data: Shortness of breath, fatigue, productive cough, asbestos exposure, and 20 pound weight loss.  CT CHEST WITH CONTRAST  Technique:  Multidetector CT imaging of the chest was performed following the standard protocol during bolus administration of intravenous contrast.  Contrast: 80 ml Omnipaque-300  Comparison: 02/19/2006  Findings: Mediastinal lymph nodes measure up to 12 mm in short axis, anterior to the right mainstem bronchus, stable.  No hilar or axillary adenopathy.  Pulmonary arteries are enlarged.  There is atherosclerotic calcification of the arterial vasculature, including coronary arteries.  Heart is at the upper limits of normal in size.  No pericardial effusion.  Small juxta diaphragmatic lymph nodes are noted.  There is extensive pleural plaque formation bilaterally, with associated calcification, as before.  Biapical pleural parenchymal scarring.  There is basilar predominant subpleural reticulation, parenchymal bands and volume loss.  No pleural fluid.  Airway is unremarkable.  Incidental imaging of the upper abdomen shows no acute findings. No worrisome lytic or sclerotic lesions.  IMPRESSION:  1.  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 2. Pulmonary arterial hypertension.  Read By:  Reyes Ivan.,  M.D.     Released By:  Reyes Ivan.,  M.D.

## 2010-07-23 NOTE — Miscellaneous (Signed)
Summary: ALPHA 1 ANTITRYPSIN RESULTS/KCW  Clinical Lists Changes  Observations: Added new observation of A-1 ANTITRYP: MM (07/18/2010 17:28)

## 2010-07-23 NOTE — Progress Notes (Signed)
Summary: spiriva [facility]  Phone Note Call from Patient Call back at Home Phone [phone]   Caller: Patient Call For: young Summary of Call: pt says his rx for spiriva was to [facility] (pt saw dr young 07/11/10 and was given a rx for this but says he didn't get one .  Initial call taken by: Tivis Ringer, CNA,  July 18, 2010 4:02 PM  Follow-up for Phone Call        Called and spoke with pt and he states he never received rx for his spiriva. Katie states she faxed prescription for pt but pharmacy states they never received it. [facility] rx of presciption to [facility]. Pt aware rx was sent.  Carver Fila  July 18, 2010 5:01 PM     Prescriptions: SPIRIVA HANDIHALER 18 MCG CAPS (TIOTROPIUM BROMIDE MONOHYDRATE) 1 daily  #90 x 3   Entered by:   Carver Fila   Authorized by:   Waymon Budge MD   Signed by:   Carver Fila on 07/18/2010   Method used:   Faxed to ...       [facility] ([facility]order)       [address]       [place], [place]  [address]       Ph: [phone]       Fax: [phone]   RxID:   [id number]

## 2010-07-24 ENCOUNTER — Telehealth: Payer: Self-pay | Admitting: *Deleted

## 2010-07-24 NOTE — Telephone Encounter (Signed)
No ID on pt voicemail. No message left

## 2010-07-30 ENCOUNTER — Encounter: Payer: Self-pay | Admitting: Internal Medicine

## 2010-08-01 NOTE — Procedures (Signed)
Summary: Colonoscopy  Patient: Curtis [name] Note: All result statuses are Final unless otherwise noted.  Tests: (1) Colonoscopy (COL)   COL Colonoscopy           DONE     [facility]     [address]     [place], [place]  [address]          COLONOSCOPY PROCEDURE REPORT          PATIENT:  Curtis, [name]  MR#:  [id number]     BIRTHDATE:  [birth year], 65 yrs. old  GENDER:  male     ENDOSCOPIST:  Hedwig Morton. Juanda Chance, MD     REF. BY:  Benedetto Goad, MD     PROCEDURE DATE:  07/23/2010     PROCEDURE:  Colonoscopy [address]     ASA CLASS:  Class II     INDICATIONS:  family history of colon cancer mother with colon     cancer     last colon 1994 was normal     hx of IBS/diarrhea     MEDICATIONS:   Versed 15 mg, Fentanyl 150 mcg, Benadryl 50 mg          DESCRIPTION OF PROCEDURE:   After the risks benefits and     alternatives of the procedure were thoroughly explained, informed     consent was obtained.  Digital rectal exam was performed and     revealed no rectal masses.   The LB [id number] endoscope     was introduced through the anus and advanced to the cecum, which     was identified by both the appendix and ileocecal valve, without     limitations.  The quality of the prep was Miralax fair.  The     instrument was then slowly withdrawn as the colon was fully     examined.     <<PROCEDUREIMAGES>>     FINDINGS:  Mild diverticulosis was found in the sigmoid colon (see     image2, image1, and image8).  This was otherwise a normal     examination of the colon (see image9, image7, image6, image5, and     image4).   Retroflexed views in the rectum revealed no abnormal     ities.    The scope was then withdrawn from the patient and the     procedure completed.          COMPLICATIONS:  None     ENDOSCOPIC IMPRESSION:     1) Mild diverticulosis in the sigmoid colon     2) Otherwise normal examination     RECOMMENDATIONS:     1) high fiber diet     REPEAT EXAM:  In 5  year(s) for.          ______________________________     Hedwig Morton. Juanda Chance, MD          CC:          n.     eSIGNED:   Hedwig Morton. Massey Ruhland at 07/23/2010 02:43 PM          Curtis [name], [id number]  Note: An exclamation mark (!) indicates a result that was not dispersed into the flowsheet. Document Creation Date: 07/23/2010 2:44 PM _______________________________________________________________________  (1) Order result status: Final Collection or observation date-time: 07/23/2010 14:35 Requested date-time:  Receipt date-time:  Reported date-time:  Referring Physician:   Ordering Physician: Lina Sar [phone]) Specimen Source:  Source: Launa Grill Order Number: [id number] Lab site:

## 2010-08-12 NOTE — Progress Notes (Signed)
Quick Note:  Pt aware of results. ______ 

## 2010-09-02 ENCOUNTER — Other Ambulatory Visit: Payer: Self-pay | Admitting: *Deleted

## 2010-09-02 MED ORDER — OMEPRAZOLE 20 MG PO CPDR: 20.0000 mg | DELAYED_RELEASE_CAPSULE | Freq: Every day | ORAL | Status: DC

## 2010-09-04 ENCOUNTER — Other Ambulatory Visit: Payer: Self-pay | Admitting: Internal Medicine

## 2010-09-04 NOTE — Telephone Encounter (Signed)
Patient just calling to make sure we got his mailed in request for refills on omeprazole. Advised patient that rx was sent on 09/02/10.

## 2010-09-17 NOTE — Assessment & Plan Note (Signed)
[facility]                         GASTROENTEROLOGY OFFICE NOTE   ZIDAN, HELGET                     MRN:          [id number]  DATE:03/25/2007                            DOB:          08/18/1945    Mr. Manthei is a 65 year old gentleman who has history of severe  gastroesophageal reflux disease.  He underwent Nissen fundoplication in  1994 and has done quite well taking Prilosec 20 mg a day.  He has also a  history of reflex sympathetic dystrophy, asthmatic bronchitis and newly-  diagnosed asbestosis.  This was diagnosed in 2006 and he has been  followed by Clinton D. Young, MD.  The patient underwent VATS procedure  for pleural effusions one year ago by Dr. Dorris Fetch.  He stopped  smoking entirely one year ago.  He has occasional dysphagia.  Upper  endoscopy in June 2006 showed functioning Nissen fundoplication.   MEDICATIONS:  1. Aspirin 325 mg p.o. b.i.d.  2. Elavil 50 mg q.h.s.  3. Alprazolam 1 mg t.i.d.  4. Vitamin E.  5. Omeprazole 20 mg p.o. daily.  6. Gabapentin 300 mg t.i.d.  7. Flomax 0.4 mg daily.  8. Metoprolol b.i.d.   PHYSICAL EXAM:  Blood pressure 142/84, pulse 66 and weight 203 pounds.  He was alert, oriented, in no distress.  LUNGS:  Decreased breath sounds, increased AP diameter of his chest.  He  had rales in both bases of his lungs.  There were scars from chest tubes  in the right lung.  CARDIAC:  Normal S1, normal S2.  ABDOMEN:  Soft with mild tenderness along the right costal margin.  Normoactive bowel sounds.  RECTAL:  Exam not done.   IMPRESSION:  A 65 year old white male with gastroesophageal reflux  disease, currently under good control with omeprazole 20 mg a day.   PLAN:  Refill omeprazole 20 mg for 3 months' supply with three refills.  He wants the prescription to start in mid-December 2008.  He will stop  by to drop off a form for [place] to fill out to send to his new insurance  company, [facility], where he can get his prescription for a  small co-pay.  He will let us know when he needs his esophageal  dilatation.  As far as his  colonoscopy is concerned, we have done several colonoscopies in the past  but I do not see any documentation of it in his current chart.  We will  look for another chart on him.     Hedwig Morton. Juanda Chance, MD  Electronically Signed    DMB/MedQ  DD: 03/25/2007  DT: 03/25/2007  Job #: [id number]   cc:   Gloriajean Dell. Andrey Campanile, M.D.

## 2010-09-17 NOTE — Assessment & Plan Note (Signed)
[facility]                             PULMONARY OFFICE NOTE   Curtis [name], Curtis [name]                     MRN:          [id number]  DATE:01/14/2007                            DOB:          05-Mar-1946    PROBLEM LIST:  1. 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  2. Chronic obstructive pulmonary disease/chronic bronchitis.  3. Tobacco abuse.   HISTORY:  He has not smoked since last October.  Coughing up some clear  mucous with occasional plugs, but nothing bloody or purulent.  No chest  pain, no sudden events, no real change.   MEDICATIONS:  1. Aspirin 325 mg.  2. Amitriptyline 50 mg x4.  3. Alprazolam 2 mg t.i.d.  4. Vitamins.  5. Omeprazole 20 mg.  6. Flomax 0.4 mg.  7. Metoprolol b.i.d.  8. Iron.  9. Albuterol rescue inhaler.   ALLERGIES:  No known drug allergies.   OBJECTIVE:  VITAL SIGNS:  Weight 204 pounds, blood pressure 140/86,  pulse 64, room air saturation 96%.  GENERAL:  He seems comfortable and stable.  I find no adenopathy or  edema.  No cyanosis or clubbing.  LUNGS:  He has a few rhonchi at the right base laterally with  inspiration only.  HEART:  Sounds are regular without murmur.   Chest x-ray on June 18, 2006, had been stable compared with November  of 2007 with stable pleural and parenchymal scarring in the right lung  base and pleural thickening in the right chest which has not changed as  well as some pleural changes on the left.   IMPRESSION:  1. Asbestos related plaques and some scarring.  2. Bronchitis with chronic cough.  Fortunately now has stopped      smoking.   PLAN:  1. I encouraged weight loss, pointing out that he had been slowly      drifting up and this would begin to interfere with his breath      capacity.  2. We are scheduling pulmonary function tests.  3. Return in six months anticipating that we will repeat a chest x-ray      then.     Clinton D.  Maple Hudson, MD, Tonny Bollman, FACP  Electronically Signed    CDY/MedQ  DD: 01/14/2007  DT: 01/15/2007  Job #: [id number]   cc:   Evelena Peat, M.D.

## 2010-09-20 NOTE — Op Note (Signed)
Curtis [name], Curtis [name]              ACCOUNT NO.:  [id number]   MEDICAL RECORD NO.:  [id number]          PATIENT TYPE:  INP   LOCATION:  2033                         FACILITY:  [facility]   PHYSICIAN:  Salvatore Decent. Dorris Fetch, M.D.DATE OF BIRTH:  December 12, 1945   DATE OF PROCEDURE:  02/23/2006  DATE OF DISCHARGE:                                 OPERATIVE REPORT   PREOPERATIVE DIAGNOSIS:  Right pleural effusion and pleural masses, rule out  mesothelioma.   POSTOPERATIVE DIAGNOSIS:  Right pleural effusion and pleural masses, rule  out mesothelioma.   PROCEDURE:  Right video assisted thoracoscopy, pleural biopsies, drainage of  pleural effusion.   SURGEON:  Salvatore Decent. Dorris Fetch, M.D.   ASSISTANT:  Pecola Leisure, P.A.-C.   ANESTHESIA:  General.   FINDINGS:  Extensive pleural plaquing, frozen section revealed some atypical  cells but no definite cancer, multiple lymph nodes in pericardial fat pad.  Frozen section of one node revealed anthracotic node but no tumor seen.   CLINICAL NOTE:  Curtis [name] is a 65 year old gentleman who was admitted with  chest pain.  He had an extensive cardiac workup which revealed no cardiac  source of his chest pain.  He has a history of asbestosis and pleural  plaquing bilaterally but now had progression of the disease on his right  chest with pleural effusion and a complex pleural space by both chest x-ray  and CT scan.  The concern, given his history of asbestosis and asbestos  exposure, was that he could have developed a malignant mesothelioma. The  patient was offered the option of thoracentesis, transthoracic biopsy, or  right VATS with pleural biopsies, lymph node biopsies, and possible talc  reduces. The indications, risks, benefits and alternatives were discussed in  detail with the patient.  He understood and accepted the risks and agreed to  proceed.   OPERATIVE NOTE:  Curtis [name] was brought to the preop holding area on February 23, 2006.  There,  intravenous access and arterial blood pressure monitoring  catheters were placed.  PAS hose were placed for DVT prophylaxis.  Intravenous antibiotics were administered.  He was taken to the operating  room, anesthetized and intubated with a double-lumen endotracheal tube.  A  Foley catheter was placed.  He was placed in a left lateral decubitus  position and the right chest was prepped and draped in the usual fashion.  Single lung ventilation of the left lung was carried out and the patient  tolerated this well throughout the procedure.  An incision was made in the  posterior axillary line in approximately the fifth intercostal space.  This  was carried through the skin and subcutaneous tissue.  The chest was entered  bluntly using a hemostat.  A port was inserted and the scope was inserted  through the port. A second port incision was made in the same interspace  more anteriorly for instrumentation.  Visual inspection of the pleural space  revealed a complex pleural space.  There was some murky fluid.  This was  blood tinged. It was unclear if that was from the entry sites.  The fluid  was sent for both cultures and cytology.  There were multiple areas of  pleural plaquing, there were also significant adhesions. The adhesions were  taken down to ensure that there were no discrete mass lesions. Multiple  areas of pleural plaquing were biopsied. One area was sent for frozen  section and this revealed some atypical cells, but no definite cancer. There  were particulate thick areas in the anterolateral aspect of the chest wall  which were biopsied with a Tru-Cut needle because of the inability with the  biopsy forceps to get a piece of the very dense and fibrous tissue.  There  was also an area along the diaphragm that was similar, the biopsy forceps  were unable to get a deep bite of the tissue, therefore the Tru-Cut needle  was used to biopsy that area, as well. There was a plaque anteriorly  which  was very prominent on the CT scan.  This plaque was also biopsied. The  pericardial fat pad was identified and nodes could be seen within the fat  pad.  These were prominent on the CT scan. The pericardial fat pad was  dissected off and sent for both frozen and permanent sections.  The frozen  section of the lymph node revealed anthracotic node but no tumor was seen  and no additional nodes were present in the specimen for permanent section.  The chest was copiously irrigated with warm saline solution. A 28-French  chest tube was placed anteriorly and directed to the apex and a 36-French  right-angle chest tube was directed posteriorly and directed towards the  diaphragm. The chest tubes were secured with #1 silk sutures, they were  placed to suction. The lung was reinflated.  The patient was placed back in  the supine position.  He was extubated in the operating room and taken to  the post anesthetic care unit in good condition.           ______________________________  Salvatore Decent Dorris Fetch, M.D.     SCH/MEDQ  D:  02/23/2006  T:  02/24/2006  Job:  [id number]   cc:   Joni Fears D. Maple Hudson, MD, FCCP, FACP

## 2010-09-20 NOTE — Discharge Summary (Signed)
Curtis [name], Curtis [name]              ACCOUNT NO.:  [id number]   MEDICAL RECORD NO.:  [id number]          PATIENT TYPE:  INP   LOCATION:  2033                         FACILITY:  [facility]   PHYSICIAN:  Hillery Aldo, M.D.   DATE OF BIRTH:  29-Oct-1945   DATE OF ADMISSION:  02/17/2006  DATE OF DISCHARGE:  03/02/2006                                 DISCHARGE SUMMARY   PRIMARY CARE PHYSICIAN:  Dr. Benedetto Goad.   CVTS SURGEON:  Dr. Dorris Fetch.   CARDIOLOGIST:  Dr. Amil Amen.   DISCHARGE DIAGNOSES:  1. Asbestosis, status post right video-assisted thoracoscopy with pleural      biopsies.  2. Right pleural effusion and pleural masses, pathology consistent with      asbestosis and chronic inflammation.  3. Iron deficiency anemia.  4. Hypertension.  5. Chronic obstructive pulmonary disease.  6. Generalized anxiety disorder.  7. Benign prostatic hypertrophy.  8. Hypertension.   DISCHARGE MEDICATIONS:  1. Alprazolam 2 mg t.i.d.  2. Aspirin 325 mg daily.  3. Omeprazole 20 mg daily.  4. Amitriptyline 200 mg q.h.s.  5. Gabapentin 300 mg t.i.d.  6. Metoprolol 50 mg b.i.d.  7. Ferrous sulfate 325 mg daily.   CONSULTATIONS:  1. Dr. Dorris Fetch of CVTS.  2. Dr. Peterson Lombard of cardiology.   PROCEDURES AND DIAGNOSTIC STUDIES:  1. Chest x-ray on February 17, 2006 showed pleural effusion and air space      disease right lower lobe with evidence of loculated effusion.  2. A 2-D echocardiogram on February 18, 2006 showed normal left ventricular      systolic function with an ejection fraction of 65%.  There were no left      ventricular regional wall motion abnormalities.  Left ventricular wall      thickness was moderately to markedly increased.  There was trivial      aortic valvular regurgitation.  3. Stress test on February 18, 2006 showed normal myocardial perfusion      study without evidence for infarction or ischemia.  Estimated ejection      fraction 66%.  4. Chest x-ray on February 18, 2006  showed right middle and lower lobe air      space disease, possibly representative of pneumonia.  Loculated right      pleural effusion possibly representative of empyema.  CT of the chest      with contrast recommended.  5. Chest x-ray on February 19, 2006 showed no significant change in pleura      and parenchymal opacity at the right lung base.  6. CT scan of the chest on February 19, 2006 showed findings suggestive of      asbestosis exposure possibly with superimposed mesothelioma.  7. Chest x-ray on February 23, 2006 showed status post right chest surgery      with chest tubes and pleural and parenchymal densities.  A small right      apical pneumothorax versus subcutaneous gas could not be excluded.      Central venous catheter was seen overlying the SVC.  8. Chest x-ray on February 24, 2006 showed no significant change.  There  was an increase in the right lower lobe air space disease thought to be      representative of postoperative hemorrhages or atelectasis.  9. Chest x-ray on February 25, 2006 showed no definite pneumothorax with      two right chest tubes in place.  There was extensive pleural      parenchymal opacity in the right lung and developing midlung zone air      space disease.  10.Chest x-ray on February 26, 2006 showed interval removal of one of the      two right chest tubes with a tiny lateral right pneumothorax.      Otherwise no change.  11.Chest x-ray on February 27, 2006 showed improved left lung aeration.      Otherwise stable appearance.  12.Chest x-ray on February 28, 2006 showed stable right lower lobe      atelectasis/consolidation.  Stable right base loculated      hydropneumothorax.  Right chest tube remained.  13.Chest x-ray on February 28, 2006 status post chest tube removal showed a      loculated pneumothorax in the right base which was stable following      chest tube removal.  Right pleural thickening and fluid and right lower      lobe air space  disease.  Slight increase in the left upper lobe      infiltrate.  14.Chest x-ray on March 01, 2006 showed slightly improved aeration and      decreased left midlung air space disease.  Stable chest.   DISCHARGE LABORATORY VALUES:  White blood cell count was 10, hemoglobin 11,  hematocrit 31.9, MCV 78.5, platelet count 476.  Sodium was 137, potassium  4.1, chloride 101, bicarb 25, BUN 7, creatinine 0.8, glucose 105.  Urine  cultures were negative.  Pleural fluid cultures were negative.   HISTORY OF PRESENT ILLNESS:  The patient is a 65 year old male who presented  to the hospital on February 17, 2006 with chief complaint of intermittent  chest pain over the previous 3-4 weeks.  He was seen by his primary care  physician who directed him to the hospital for further evaluation and  workup.  Upon initial evaluation, he was found to have an abnormal chest x-  ray.  He was admitted for further cardiac and pulmonary evaluation and  workup.   HOSPITAL COURSE:  Problem 1:  CHEST PAIN:  The patient was initially  admitted to the telemetry floor and a cardiac workup was undertaken.  Dr.  Amil Amen was consulted and felt that the patient would benefit from further  diagnostic studies including an Adenosine-Cardiolite and 2-D echocardiogram.  Results of those studies are noted above.  No further cardiac workup was  felt to be needed.  Attention was turned to the patient's abnormal chest x-  ray.  A CVTS consultation was requested and kindly provided by Dr.  Dorris Fetch.  The patient was also seen by Dr. Maple Hudson of pulmonary who felt  that the patient's history of present illness was consistent with asbestosis  and that a biopsy was warranted to exclude mesothelioma.  The patient  underwent a VATs procedure with findings as noted above.  Pathology was  significant for fibrous tissue with mesothelial-type cell, chronic  inflammatory infiltrate and reactive mesothelial cells but no definitive evidence  of malignancy.  These findings were consistent with asbestosis.  The patient had two chest tubes placed postprocedure and when the drainage  was reduced his chest tubes were pulled.  The  patient tolerated this well.  At this juncture, he has been deemed stable for discharge by CVTS.  He is  oxygenating well and is able to maintain his O2 saturation with activity.  We will therefore discharge him with follow-up with his primary care  physician and with Dr. Maple Hudson of pulmonology.   Problem 2:  IRON DEFICIENCY ANEMIA:  The patient was fairly anemic during  the course of his hospitalization.  Some of his anemia may have been due to  losses through the chest tube.  His ferritin was found to be 24, total iron  31, total iron binding capacity 363 with 9% saturation.  He was therefore  put on iron supplementation and given 2 units of packed red blood cells.  Hemoglobin remains stable at this time after having reached a nadir of 8.4  mg/dL.  He should have his blood counts monitored by his primary care  physician with further diagnostic testing if indicated.   Problem 3:  HYPERTENSION:  The patient's hypertension was fairly well-  controlled on the regimen as outlined above.   Problem 4:  CHRONIC OBSTRUCTIVE PULMONARY DISEASE:  The patient's chronic  obstructive pulmonary disease remained stable through the course of his  hospitalization.  He was continued on bronchodilator therapy on a p.r.n.  basis.   Problem 5:  GENERALIZED ANXIETY DISORDER:  The patient was maintained on his  usual home dose of Xanax.  His mood remained stable throughout the course of  hospitalization.   Problem 6:  BENIGN PROSTATIC HYPERTROPHY:  The patient did have difficulty  with voiding after his Foley catheter was placed and removed.  This  warranted replacement of Foley catheter and a voiding trial the next day  which the patient was able to pass without any difficulty.  He has not had  any further complaints of  urinary retention.   DISPOSITION:  The patient is stable for discharge home.  His chest x-ray has  been stable.  He should follow up with Dr. Andrey Campanile and Dr. Maple Hudson of  pulmonology.   CONDITION ON DISCHARGE:  Improved.      Hillery Aldo, M.D.  Electronically Signed     CR/MEDQ  D:  03/02/2006  T:  03/03/2006  Job:  [id number]   cc:   Gloriajean Dell. Andrey Campanile, M.D.  Salvatore Decent Dorris Fetch, M.D.  Francisca December, M.D.

## 2010-09-20 NOTE — Consult Note (Signed)
NAMEADIL, [name]              ACCOUNT NO.:  [id number]   MEDICAL RECORD NO.:  [id number]          PATIENT TYPE:  INP   LOCATION:  3707                         FACILITY:  [facility]   PHYSICIAN:  Salvatore Decent. Dorris Fetch, M.D.DATE OF BIRTH:  1945-11-30   DATE OF CONSULTATION:  02/20/2006  DATE OF DISCHARGE:                                   CONSULTATION   REASON FOR CONSULTATION:  Rule out mesothelioma.   HISTORY OF PRESENT ILLNESS:  Curtis [name] is a 65 year old gentleman with a  known history of tobacco abuse and asbestos exposure with preexisting  asbestosis.  He presented on February 17, 2006 with a 3-4 week history of  chest pain.  He had a particularly severe episode the morning of admission  when he developed chest pain that was substernal in nature and radiated to  his right neck and right shoulder.  He took Xanax and 3 aspirin but it did  not relieve his symptoms.  He also complained of a tight sensation in his  chest and pain with taking a deep breath.  These symptoms were all localized  centrally in the chest and did not lateralize to the right or left side.  He  described his pain as a 10 out of 10.  He went to see Dr. Andrey Campanile.  He had an  EKG which was normal.  He was sent to the hospital for further workup.  The  nitroglycerin was given.  The pain did diminish somewhat.  He ruled out for  myocardial infarction.  He was seen in consultation by Dr. Corliss Marcus and  had an echocardiogram as well as an Adenosine Myoview, both of which showed  normal left ventricular function.  The Cardiolite was negative for ischemia.  He also had a chest x-ray done which showed a right pleural effusion and  complex pleural disease bilaterally.  He had been followed by Dr. Jetty Duhamel for asbestosis.  A CT scan showed right pleural effusion, a complex  right pleura with plaquing, and possible masses suspicious for a possible  mesothelioma.  Dr. Jetty Duhamel saw him in consultation.  He has  known the  patient as an outpatient and documented that the chest x-ray findings are  more extensive than they had been previously.   His past medical history is significant for asbestosis, COPD, chronic  bronchitis, irritable bowel syndrome, diverticulosis, a history of  intestinal blockage, a history of a left knee fracture, sympathetic  dystrophy, acid reflux.   Past surgical history is significant for a partial gastrectomy for severe  acid reflux, bilateral elbow surgery, bilateral testicular surgery, three  tumors removed from his left shoulder, as well as arthroscopy on his left  knee.   MEDICATIONS AT THE TIME OF ADMISSION:  1. Gabapentin 300 mg p.o. t.i.d.  2. Amitriptyline 200 mg p.o. nightly.  3. Omeprazole 20 mg daily.  4. Alprazolam 2 mg p.o. t.i.d. p.r.n. which he takes essentially around-      the-clock.  5. Vitamin E 400 international units daily.  6. Aspirin 325 mg 2 tablets b.i.d..   HE HAS QUESTIONABLE  ALLERGIES TO CODEINE AND NASACORT.   FAMILY HISTORY:  Significant for cancer.   SOCIAL HISTORY:  He smokes 1 pack a day, he started at age 26.  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  He rarely drinks alcohol.   REVIEW OF SYSTEMS:  He denies any recent fevers, chills, sweats.  He has got  an occasional cough that has been nonproductive.  No recent change in bowel  or bladder habits.  No history of excessive bleeding or bruising.  All other  systems are negative.   PHYSICAL EXAMINATION:  GENERAL:  On physical examination, Curtis [name] is a 65-  year-old gentleman who appears older than his stated age.  He is well-  developed and well-nourished.  VITAL SIGNS:  He is afebrile.  His blood pressure is 155/73.  Pulse is 60.  Respirations are 18.  His oxygen saturation is  94% on room air.  HEENT Exam:  He is wearing glasses.  Poor dentition.  Otherwise  unremarkable.  NECK:  Supple without thyromegaly, adenopathy,  or bruits.  LUNGS:  Lungs have diminished breath sounds on the right side.  He has  congestion bilaterally.  CARDIAC:  His cardiac exam has a regular rate and rhythm.  Normal S1-S2.  There is a 2/6 systolic murmur.  ABDOMEN:  Soft, nontender.  EXTREMITIES:  Without clubbing, cyanosis, or edema.   LABORATORY DATA:  CK of 83, MB 1.4, troponin less than 0.01.  Sodium 141,  potassium 3.7, BUN and creatinine 8 and 1.0, glucose is 80,  white count  7.8, hematocrit 33, platelets 432, alkaline phosphatase 119, ALT and AST and  total bilirubin are normal,  cholesterol 223 with HDL 27, LDL of 151.  EKG  showed sinus bradycardia with no ischemic changes; Cardiolite negative for  ischemia.  Chest x-ray showed pleural effusion and pleural and parenchymal  opacity in the right lung base.  Chest CT showed __________  asbestosis;  possibly mesothelioma; there is adenopathy in the mediastinal nodes.   IMPRESSION:  Curtis [name] is a 65 year old gentleman with a history of tobacco  abuse and asbestos exposure with known asbestosis.  He now presents with  some chest pain which is atypical in nature and may be related to esophageal  spasm.  His cardiac workup was negative.  His pain does have a pleuritic  component but is not lateralized typical of pleuritic pain secondary to a  pleural process.  In any event, it did lead to a workup which included a  chest x-ray and a chest CT which showed progression of pleural process with  pleural effusion and pleural masses, as well as adenopathy.  This could be  related to infection or more seriously to mesothelioma.  Our options for  diagnosis include a thoracentesis with cytology, an  attempted needle biopsy  of the pleura, or a VATS with pleural biopsy and then possible talc  pleurodesis.  If thoracentesis were chosen and cytology was negative, that  would not be definitive.  However, if it was positive, he would still need a therapeutic procedure to deal with his  pleural effusion.  I think the best  option for both diagnosis and palliative treatment would be to do a VATS  with pleural biopsies and then if positive, we could do a talc pleurodesis.  This would not rule out the possibility of doing an extrapleural  pneumonectomy although he is not a great candidate for that  procedure.  We  certainly would need extensive testing before considering that but in any  event we could make a definitive diagnosis whether or not this was  mesothelioma.  He understands this is not ruled out.  If this is negative at  this point that he could develop mesothelioma in the future.   I had a long discussion with Curtis [name] outlining the options and relative  advantages and disadvantages of each approach.  We also discussed the nature  and extent of the biopsy procedure with VATS which would be requiring  general anesthesia and 2-3 small incisions for port access, for scope, and  biopsies.  I did discuss with him the indications, risks, benefits.  He  understands that the risks include but are not limited to death, MI, DVT,  PE,  bleeding, possible need for transfusions, infections, as well as other organ  system dysfunction such as respiratory or renal failure.  He understands and  accepts these risks and wishes to proceed with a VATS biopsy and possible  talc pleurodesis.           ______________________________  Salvatore Decent Dorris Fetch, M.D.     SCH/MEDQ  D:  02/20/2006  T:  02/21/2006  Job:  [id number]   cc:   Joni Fears D. Young, MD, FCCP, FACP  Fred H. Andrey Campanile, M.D.

## 2010-09-20 NOTE — H&P (Signed)
NAMEZACKARIE, CHASON              ACCOUNT NO.:  [id number]   MEDICAL RECORD NO.:  [id number]          PATIENT TYPE:  EMS   LOCATION:  MAJO                         FACILITY:  [facility]   PHYSICIAN:  Michaelyn Barter, M.D. DATE OF BIRTH:  [birth year]   DATE OF ADMISSION:  02/17/2006  DATE OF DISCHARGE:                                HISTORY & PHYSICAL   CHIEF COMPLAINT:  Chest pain.   HISTORY OF PRESENT ILLNESS:  Mr. Rossa is a 65 year old gentleman who has  had chest pain off and on for the past 3-4 weeks.  This morning at  approximately 10 a.m., he got up to go to the bathroom.  He was sitting on  the toilet when he developed chest pain.  The pain traveled up the right  side of his neck.  He stated that this was different than the previous  episodes of chest pain that he has had before in that, during the prior  episodes of chest pain, the pain never traveled up his neck.  He took Xanax  plus three aspirin this morning to relieve his symptoms.  During this event,  his chest felt tight, it became difficult for him to breathe deeply, and he  felt as though there was a knife/stabbing sensation in the central region of  his chest whenever he would take a deep breath.  This was located centrally.  He denied having any radiation to his back or down his arm.  10 out of 10  was the peak intensity of the pain.  He went to see his primary care  physician.  There, he was given sublingual nitroglycerin and an EKG was  completed.  It was found to be normal.  He was told that he did have a  murmur at that particular time and was sent to the hospital for evaluation.  He denies having any nausea, vomiting, fevers, or chills.  No diaphoresis.  But, he did feel short of breath.  Currently, his pain is approximately 1  out of 10 and his breathing is currently better.   PAST MEDICAL HISTORY:  1. The patient had a stress test completed in the early 1990s, he cannot      recall the results of that.  2.  Asbestos exposure, the patient follows up with Dr. Jetty Duhamel of      [place].  3. COPD.  4. Chronic bronchitis.  5. Irritable bowel syndrome.  6. Diverticulosis.  7. Intestinal blockage.  8. 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  9. Acid reflux.   PAST SURGICAL HISTORY:  1. Half of the patient's stomach was removed secondary to severe acid      reflux in 1994.  Dr. Mosetta Anis was the physician who performed      that.  2. Right elbow surgery x2 for tendinitis.  3. Left elbow surgery x1 for tendinitis.  4. Bilateral testicular surgery secondary to infections.  5. Three tumors removed from the left shoulder.  6. Arthroscopic  surgery on the left knee.   ALLERGIES:  None.   HOME MEDICATIONS:  1. Gabapentin 300 mg p.o. t.i.d.  2. Amitriptyline 50 mg 4 tablets q.h.s.  3. Omeprazole 20 mg daily.  4. Alprazolam 2 mg t.i.d.  5. Vitamin E 400 iu daily.  6. Aspirin 325 mg 2 tablets b.i.d.   SOCIAL HISTORY:  Cigarettes, the patient states that he started smoking  cigarettes at the age of 29, he smokes one pack per day.  Alcohol, the  patient drinks one beer at least once every three weeks.   FAMILY HISTORY:  Kidney disease was present in the patient's father.  He  died secondary to pneumonia in 1958.  The patient's mother had colon cancer  and lived to be 90.  The patient states that there is a strong history of  cancer in his family.   REVIEW OF SYMPTOMS:  As per HPI.   PHYSICAL EXAMINATION:  GENERAL:  The patient is awake, cooperative, in no obvious respiratory  distress.  NECK:  Supple, no JVD, strong carotids bilaterally, no bruits.  VITAL SIGNS:  Temperature 97.3, blood pressure 155/82, heart rate 78,  respirations 24, O2 saturation 96%.  HEENT:  Normocephalic, atraumatic, anicteric, extraocular movements intact,  pupils are equally reactive to light, oral mucosa is pink, no thrush,  no  exudates.  CARDIAC:  S1 and S2 present, regular rate and rhythm, no murmurs, gallops,  and rubs, no parasternal heave, positive non-displaced PMI.  RESPIRATORY:  No crackles or wheezes.  ABDOMEN:  Flat, soft, nontender, nondistended, positive bowel sounds x4  quadrants.  EXTREMITIES:  No leg edema.  NEUROLOGICAL:  The patient is alert and oriented x3.  MUSCULOSKELETAL:  5/5 upper and lower extremity strength.   LABORATORY DATA:  EKG reveals normal sinus rhythm, good R-wave progression.  ABG with pH 7.407, pCO2 40.4, bicarb 25.4.  Hemoglobin 11.2, hematocrit 33.  Sodium 139, potassium 4.4, chloride 110, BUN 9, creatinine 1, glucose 88.  CK MB, POC 1.1, troponin I, POC less than 0.05, myoglobin, POC 83.5.   ASSESSMENT/PLAN:  1. Chest pain, etiology sounds cardiac in nature, given the fact that the      patient has been having chest pain for several weeks and now he reports      a change in the pain in that now it travels up his neck, one has to be      concerned about the possibility of unstable angina being present.  An      EKG has been completed and has been found to be unimpressive.  Will      cycle the patient's cardiac enzymes x3 eight hours apart.  Will check a      2D echocardiogram.  Will provide aspirin, morphine, oxygen.  Will      consider anticoagulation.  Will consult cardiology.  The patient has      multiple risk factors including a history of heavy cigarette smoking.      Will check a fasting lipid profile.  Will keep the patient n.p.o. after      midnight.  The patient may need a stress test versus cardiac      catheterization.  Will also check a chest x-ray.  2. Elevated blood pressure.  This may be newly diagnosed hypertension.      Will start the patient on Lopressor for now.  3. History of COPD/asbestos exposure.  Will continue oxygen.  4. Cigarette abuse, will encourage cessation. 5. History of GERD, will provide  Protonix.      Michaelyn Barter, M.D.   Electronically Signed     OR/MEDQ  D:  02/17/2006  T:  02/17/2006  Job:  [id number]   cc:   Gloriajean Dell. Andrey Campanile, M.D.  Lyn Records, M.D.

## 2010-09-20 NOTE — Assessment & Plan Note (Signed)
[facility]                             PULMONARY OFFICE NOTE   MONTRICE, GRACEY                     MRN:          [id number]  DATE:06/18/2006                            DOB:          Apr 14, 1946    PROBLEM LIST:  1. 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  2. Chronic obstructive pulmonary disease/chronic bronchitis.  3. Tobacco abuse.   HISTORY:  He had a VATs biopsy and drainage of pleural effusion February 28, 2006 by Dr. Dorris Fetch.  I believe all materials returned benign  but we are double checking on that.  He quit smoking at that time and I  have strongly congratulated him for that accomplishment.  He reports  daily cough sometimes bringing up clear mucus plugs with little to no  chest pain at the incision site.  Dyspnea is stable and really has not  changed over several years now.  He took an antibiotic for prostatitis  but has not had purulent sputum.  He admits to depression and is working  with Dr. Andee Poles on this.   MEDICATIONS:  1. Aspirin 325 mg p.o. b.i.d.  2. Amitriptyline 50 mg x4.  3. Alprazolam 2 mg t.i.d.  4. Vitamin E 400 units.  5. Omeprazole 20 mg.  6. Gabapentin 300 mg t.i.d. Monday through Friday.  7. Flomax 0.4 mg.  8. Metoprolol b.i.d.  9. Iron 325 mg.  10.Albuterol rescue inhaler.   No medication allergy.   OBJECTIVE:  Weight 199 pounds.  BP 146/94, pulse regular 70, room air  saturation 97%.  He is calm and seems comfortable.  Breathing is  unlabored.  I hear a few crackles, especially in the right posterior  base.  There is no accessory muscle use.  No neck vein distention or  stridor.  No clubbing or peripheral edema and no cyanosis.  Heart sounds  are regular without murmur.  I find no adenopathy.   IMPRESSION:  1. Asbestos exposure with plaques now biopsied.  2. Long smoking history, hopefully now permanently stopped.  3. We will want to get  pulmonary function test on him when he is      stabilized and we discussed that.   PLAN:  1. I have encouraged weight loss and walking for endurance.  2. Chest x-ray today.  3. Schedule return in 6 months and we will plan to schedule a PFT at      that time.     Clinton D. Maple Hudson, MD, Tonny Bollman, FACP  Electronically Signed    CDY/MedQ  DD: 06/21/2006  DT: 06/21/2006  Job #: [id number]   cc:   Evelena Peat, M.D.  Salvatore Decent Dorris Fetch, M.D.  Andee Poles, M.D.

## 2010-09-20 NOTE — Consult Note (Signed)
Curtis [name]              ACCOUNT NO.:  [id number]   MEDICAL RECORD NO.:  [id number]          PATIENT TYPE:  INP   LOCATION:  3707                         FACILITY:  [facility]   PHYSICIAN:  Francisca December, M.D.  DATE OF BIRTH:  [birth year]   DATE OF CONSULTATION:  02/18/2006  DATE OF DISCHARGE:                                   CONSULTATION   CARDIOLOGY CONSULTATION.   REASON FOR CONSULTATION:  Chest pain.   REFERRING PHYSICIAN:  Michaelyn Barter, M.D., [facility].   HISTORY OF PRESENT ILLNESS:  [name] is a 65 year old man without  prior cardiac history who presented to [facility] via EMS and  his local MD, yesterday, with complaints of anterior substernal chest pain.  The patient has been experiencing chest pain on-and-off for 3 or 4 weeks.  It is nonexertional.  It is difficult for him to describe.  It is central  and mid sternal.  It has not radiated.  It has been associated with a  sensation of difficulty breathing.  It would last hours at a time and then  resolve.  It was made worse by his nerves that would exacerbate this.   Yesterday while at rest, he had the onset of the anterior substernal chest  pain.  It became quite sharp and radiated up to the right neck.  It was not  associated with nausea or diaphoresis.  He did have difficulty getting a  deep breath.  In fact, a deep breath exacerbated the pain.  He finally  presented to his local physician in the late morning hours.  An EKG was  unremarkable.  He received sublingual nitroglycerin.  He cannot recall if it  helped.  He was transported to [facility] by EMS, received  additional nitroglycerin and aspirin en route.  He then spent several hours  in the emergency room being evaluated.  He received oxygen and nitroglycerin  1-inch via ointment.  He reports that his chest discomfort slowly resolved  down to a level 2-or-3/10 and then it persisted overnight.  He still has  very mild discomfort in his chest, this morning, that is exacerbated by a  deep breath.   CARDIAC RISK FACTORS:  Age, sex and tobacco use.  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 Joni Fears D. Young.   SOCIAL HISTORY:  He continues to smoke about a pack of cigarettes a day.   PAST MEDICAL HISTORY:  1. Asbestosis.  2. COPD.  3. Chronic bronchitis  4. Irritable bowel.  5. Diverticulosis  6. Recurrent small bowel obstruction.  7. S/P fracture left leg with subsequent RSD.  8. Acid reflux.   PAST SURGICAL HISTORY:  1. He is status post acid reflux surgery in 1994.  Reports a partial      stomach and lower esophageal removal.  2. He has had right elbow surgery x2 for tendonitis; left elbow x1.  3. He has had bilateral testicular surgery secondary to infection.  4. Arthroscopic surgery of the left knee.   DRUG ALLERGIES:  None  known.   MEDICATIONS:  1. Gabapentin 300 mg p.o. t.i.d.  2. Amitriptyline 200 mg p.o. q.h.s.  3. Omeprazole 20 mg p.o. daily.  4. Alprazolam 2 mg p.o. t.i.d.  5. Aspirin 325 mg p.o. two b.i.d.   SOCIAL HISTORY:  He started smoking at age 65, and smokes one pack of  cigarettes a day.  He is disabled and retired; previous Academic librarian.  He  drinks 1 beer every 3 weeks.   FAMILY HISTORY:  Father died of chronic kidney disease and pneumonia.  Mother died of colon cardiac cancer at age 72.  There is a strong family  history of other cancer.   REVIEW OF SYSTEMS:  Other than chronic anxiety and worry is unremarkable.   PHYSICAL EXAMINATION:  VITAL SIGNS:  The blood pressure is 160/74; pulse is  60 and regular; respiratory rate 16; temperature 97.6.  O2 saturation on  room air 98%.  GENERAL:  This is an anxious 65 year old Caucasian male, alert, oriented,  conversant.  HEENT:  Unremarkable.  Head is atraumatic and normocephalic.  The pupils are  equal and reactive to light and accommodation.  Extraocular extraocular  movements are  intact.  Sclerae are anicteric.  Oral mucosa is pink and  moist.  Tongue is not coated.  NECK:  Supple without thyromegaly or masses.  The carotid strokes are  normal.  There is no bruit.  There is no jugular venous distension.  CHEST:  Clear with the exception of decreased breath sounds in the right  base; adequate excursion bilaterally.  The heart has a regular rhythm.  Heart sounds are muffled PMI is not palpable.  The anterior chest wall is  tender on palpation.  ABDOMEN:  Soft and nontender without midline pulsatile mass.  Bowel sounds  are present all quadrants.  There is a well-healed midline surgical scar.  No hepatomegaly.  EXTERNAL GENITALIA:  Without lesions.  RECTAL:  Not performed.  EXTREMITIES:  Show full range of motion.  No edema and intact distal pulses.  NEUROLOGICALLY:  Cranial nerves II-XII are intact.  Motor and sensory are  grossly intact.  Gait not tested.  SKIN:  Warm, dry and clear.   ACCESSORY CLINICAL DATA:  Hemoglobin 11.2, hematocrit 33.0, serum  electrolytes are normal.  BUN and creatinine are normal.  CK/MB and troponin  x2 are normal.  Electrocardiogram x2 is without acute change normal sinus  rhythm.   CHEST X-RAY:  This shows a loculated right pleural effusion with airspace  disease most compatible with pneumonia.   IMPRESSION:  1. Atypical angina, almost noncardiac chest pain, myocardial infarction      ruled out.  2. Grossly abnormal chest x-ray as noted above, likely etiology of ongoing      chest pains.  3. Multiple risk factors for coronary artery disease:  Age, sex, and      tobacco use.  4. History of asbestosis and chronic obstructive pulmonary disease,      worrisome for neoplasm causing above, chest x-ray findings.  5. Recurrent small-bowel obstruction and irritable bowel syndrome.  6. Diverticulosis.  7. Gastroesophageal reflux disease, status post reflux surgery in 1994,     could also be etiology of chest pain.  8. Chronic anxiety  state.  9. Reflex sympathetic dystrophy, status post fracture of the left leg and      the knee.  10.Disabled and retired.   PLAN/RECOMMENDATIONS:  1. Agree with your management thus far, including aspirin and Lovenox,  continue home meds.  2. Will obtain adenosine Myoview study.  3. Recommend further evaluation of the chest x-ray findings.  I will order      a 2-view study.  We will likely need CT of the chest and the pulmonary      specialty evaluation.  I will leave the decision for antibiotic      treatment in your hands.      Francisca December, M.D.  Electronically Signed     JHE/MEDQ  D:  02/18/2006  T:  02/19/2006  Job:  [id number]   cc:   Gloriajean Dell. Andrey Campanile, M.D.  Lyn Records, M.D.  Clinton D. Maple Hudson, MD, FCCP, FACP

## 2010-09-20 NOTE — Consult Note (Signed)
NAMEOKLEY, MAGNUSSEN              ACCOUNT NO.:  [id number]   MEDICAL RECORD NO.:  [id number]          PATIENT TYPE:  INP   LOCATION:  3707                         FACILITY:  [facility]   PHYSICIAN:  Clinton D. Maple Hudson, MD, FCCP, FACPDATE OF BIRTH:  Apr 21, 1946   DATE OF CONSULTATION:  02/19/2006  DATE OF DISCHARGE:                                   CONSULTATION   PROBLEM:  Pulmonary consultation at the kind request of the [facility]  for this 65 year old chronic smoker known to me, now with concern of  mesothelioma and abnormal chest x-ray.   HISTORY:  He has been followed in my office with known asbestos exposure and  pleural plaques, chronic obstructive pulmonary disease and ongoing tobacco  abuse despite extensive counseling and efforts with antismoking medications,  including Chantix, which he stopped because he decided it caused malaise.  He had had a chest CT scan in June 2006 at [facility], and a  prior chest CT before that.  Since December 2006, he has been declining  repeat CT scan through me, asking instead that he be followed with chest x-  rays despite education as to the limited information provided by comparison.  Chest x-rays have appeared stable, showing bilateral pleural densities  consistent with pleural plaques.  He has not had significant evidence of  interstitial disease to fit the diagnosis of pulmonary asbestosis.  He has  presented now with a recurrent substernal pain that starts in the epigastric  area and moves up.  This has led to cardiology evaluation, including a  nuclear cardiology study, which was negative for ischemia with an ejection  fraction of 66% of predicted.  He denies lateral or pleuritic chest pains or  any change in a chronic cough producing only white sputum, any change in  swallowing, weight loss or bleeding.   SOCIAL HISTORY:  Widowed with 2 children.  Still actively smoking against  medical advice and despite considerable  counseling effort.   MEDICATIONS:  Outpatient medications at my office as of 11/20/2005 included  aspirin 325 mg, amitriptyline 50 mg x4, alprazolam 2 mg t.i.d., omeprazole  20 mg, gabapentin 300 mg t.i.d., and occasional use of an albuterol rescue  inhaler.  He had discontinued Chantix.   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   Pulmonary function tests in 2005 had shown mild obstructive airways disease  with some response to bronchodilator, normal total lung capacity with some  air trapping and normal diffusion.  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 Andee Poles, several nasal fractures with  some difficulty breathing through his nose.   FAMILY HISTORY:  Kidney disease, colon cancer, family history of cancer.   OBJECTIVE:  BP 155/79, heart rate 66 and regular, respirations 18,  temperature 98.5, room air saturation 95%.  GENERAL APPEARANCE:  A calm gentleman, recognized me.  He needed just a  little assistance to sit upright in bed without hand rails.  He was  conversational with his roommate, alert and oriented.  SKIN:  No evident rash.  ADENOPATHY:  I found none at the neck, shoulders or axillae.  HEENT:  Speech was normal.  There was no stridor.  No neck vein distention.  Hearing and vision were grossly intact.  CHEST:  Heart sounds were regular.  I did not hear a murmur or gallop.  There was no rub.  Chest dull to percussion, both lower lung zones, but  especially on the right, without pleural rub.  I did not hear rhonchi, rales  or crackles.  Breathing was not labored and he did not cough.  ABDOMEN:  No enlargement of liver or spleen.  Bowel sounds were present,  with no  tenderness.  EXTREMITIES:  No cyanosis, clubbing or edema.   RADIOLOGY:  I reviewed his chest CT.  There is an area of parenchymal  consolidation with air bronchograms, which is pleural based in the  midlateral right lung, and a nonlayering, apparently loculated pleural  effusion.  I do not see definite interstitial fibrosis.  There is a minor  area of pleural thickening on the left.   IMPRESSION:  1. Chest CT suggests possible malignancy, mesothelioma or adenocarcinoma      would be the first thoughts.  Recent symptoms do not suggest acute      pneumonia, but the chest pain might reflect mediastinal invasion.  The      pain might also reflect recurrence of his known, previously severe      reflux disease.  There might be a small amount of free fluid that could      be accessed under ultrasound guidance by thoracentesis, but I think a      higher yield and a quicker diagnostic evaluation would be obtained by      surgical pleural biopsy.  Small needle biopsies historically have      tended not to be adequate for a diagnosis of mesothelioma.  2. Ongoing tobacco abuse despite my considerable counseling efforts in the      past, including attempts with Chantix and other medications.  3. Mild chronic obstructive pulmonary disease.  4. Esophageal reflux, status post Nissen fundoplication.   RECOMMENDATIONS:  1. CVTS surgical consultation for consideration of a surgical biopsy of      the pleura.  2. Would get pulmonary function tests.      Clinton D. Maple Hudson, MD, [facility] Bollman, FACP  Electronically Signed     CDY/MEDQ  D:  02/19/2006  T:  02/20/2006  Job:  [id number]   cc:   Gloriajean Dell. Andrey Campanile, M.D.  Lyn Records, M.D.  Michaelyn Barter, M.D.

## 2010-09-20 NOTE — Assessment & Plan Note (Signed)
[facility]                               PULMONARY OFFICE NOTE   STANTON, KISSOON                     MRN:          [id number]  DATE:11/20/2005                            DOB:          Feb 27, 1946    DIAGNOSES:  1.  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  2.  Chronic obstructive pulmonary disease/chronic bronchitis.  3.  Tobacco abuse.   HISTORY:  He has been followed by Dr. Nolen Mu for mental health.  He tried  Chantix but says that it messed up his memory and nerves, so he quit it.  He has been using some plastic cigarette filter attachments, and he shows me  how stained they are by what he says are five cigarettes a day.  He denies  any productive cough, pain, blood, or new symptoms.  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  He had not wanted to do a chest CT when he  was here in December.  I asked if he wanted to do one today, and he asked  that we follow up with chest x-rays instead, although he understands they  are less sensitive.  We discussed radiation exposure, cost issues,  sensitivity, etc.  He says overall he is getting by with no new problems.   MEDICATIONS:  1.  Aspirin 325 mg which he says he takes once or twice daily.  2.  Amitriptyline 50 mg x4.  3.  Alprazolam 2 mg t.i.d.  4.  Neurontin has been stopped.  5.  Omeprazole 20 mg.  6.  Gabapentin 300 mg t.i.d.  7.  Albuterol rescue inhaler used occasionally.   ALLERGIES:  NO MEDICATION ALLERGIES.   OBJECTIVE:  VITAL SIGNS:  Weight 180 pounds.  Blood pressure 158/80, pulse  regular at 70, room air saturation 98%.  GENERAL:  He is rather talkative, in no evident distress.  SKIN:  Without rash.  ADENOPATHY:  None at the neck, shoulders, or axilla.  HEENT:  Nose and throat are clear.  CHEST:  Quiet, clear.  Breath  sounds with a few crackles in the right mid-  axillary line which cleared with deep breath.  No cough, rhonchi, or wheeze.  CARDIOVASCULAR:  Heart sounds regular without murmur or gallop.  ABDOMEN:  I cannot feel enlargement of liver or spleen.   IMPRESSION:  1.  Asbestos exposure by history, with pleural plaques but without      documented parenchymal interstitial fibrotic scarring of pulmonary      asbestosis.  2.  Ongoing tobacco abuse.  3.  At least mild obstructive airway disease on pulmonary function testing      in 2005, primarily in small airways, with normal lung volumes and normal      diffusion.   PLAN:  1.  Smoking cessation was again reemphasized and available techniques were      discussed.  2.  Scheduled to return in six months, anticipating we will do a followup  chest x-ray.                                   Clinton D. Maple Hudson, MD, [facility], FACP   CDY/MedQ  DD:  11/20/2005  DT:  11/21/2005  Job #:  [id number]   cc:   Evelena Peat, MD

## 2010-12-18 ENCOUNTER — Encounter (HOSPITAL_BASED_OUTPATIENT_CLINIC_OR_DEPARTMENT_OTHER): Payer: Medicare Other | Attending: Plastic Surgery

## 2010-12-18 DIAGNOSIS — L97809 Non-pressure chronic ulcer of other part of unspecified lower leg with unspecified severity: Secondary | ICD-10-CM | POA: Insufficient documentation

## 2010-12-18 DIAGNOSIS — Z79899 Other long term (current) drug therapy: Secondary | ICD-10-CM | POA: Insufficient documentation

## 2010-12-18 DIAGNOSIS — I10 Essential (primary) hypertension: Secondary | ICD-10-CM | POA: Insufficient documentation

## 2010-12-18 NOTE — Progress Notes (Signed)
[facility]  NAME:  Curtis [name], Curtis [name]              ACCOUNT NO.:  [id number]  MEDICAL RECORD NO.:  [id number]      DATE OF BIRTH:  10/21/1945  PHYSICIAN:  Wayland Denis, DO       VISIT DATE:  12/18/2010                                  OFFICE VISIT   CHIEF COMPLAINT:  Right lower extremity ulcer.  HISTORY OF PRESENT ILLNESS:  [name] is a 65 year old male who is here for evaluation of a right lower extremity posterior leg ulcer.  He has been followed at the [facility].  He has been treated with different local creams and treatments most currently antibiotic ointment.  The wound remains constant without much change or improvement over the past month.  Over the past couple of weeks, he has been treated with doxycycline and is currently still taking it.  He is not really clear as to how this happened, but over the course of his healing up to this point, the notes are reviewed by [facility].  According to the patient, nothing necessarily makes it better or worse, although the swelling and redness around the wound has improved in the last week. This may have been as a result of the antibiotic.  PAST MEDICAL HISTORY:  Diverticulitis, benign prostatic hypertrophy, dysuria, irritable bowel syndrome, depression, esophageal reflux, hypertension, iron deficiency, history of cellulitis.  ALLERGIES:  CODEINE.  MEDICATIONS:  Elavil, Neurontin, omeprazole, Xanax, vitamin C and E, aspirin, fish oil, finasteride, metoprolol, Xanax, and currently on doxycycline.  FAMILY HISTORY:  Noncontributory.  SOCIAL HISTORY:  He chews tobacco.  He is widowed and lives at home.  REVIEW OF SYSTEMS:  Negative otherwise stated.  PHYSICAL EXAMINATION:  GENERAL:  He is alert and oriented to person and place.  He is pleasant.  He is not a great historian. HEENT:  His pupils are equal and reactive.  Extraocular muscles are intact.  No cervical lymphadenopathy. LUNGS:  Clear.   Breathing is unlabored. HEART:  Regular.  Pulses are strong and regular bilaterally. ABDOMEN:  Slightly large, but no organomegaly palpated and nontender. EXTREMITIES:  Lower extremities, very mild swelling around the periwound area of the right posterior leg, the specifics of the wound are noted.  It was debrided down to more healthy tissue and some bleeding.  There was no purulence or drainage.  Bleeding was controlled with pressure. We will plan on Santyl, hydrogel, and TL and have him follow up in a week.  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  We also talked about taking a multivitamin, vitamin C, zinc, and decreasing his tobacco use preferably stopping it altogether.     Wayland Denis, DO     CS/MEDQ  D:  12/18/2010  T:  12/18/2010  Job:  [phone]

## 2010-12-25 NOTE — Progress Notes (Signed)
[facility]  NAME:  GEE, HABIG              ACCOUNT NO.:  [id number]  MEDICAL RECORD NO.:  [id number]      DATE OF BIRTH:  11/25/1945  PHYSICIAN:  Wayland Denis, DO       VISIT DATE:  12/25/2010                                  OFFICE VISIT   Mr. Winterhalter is a 65 year old white male who is here for followup on right lower extremity posterior ulcer.  He used Santyl last week.  It shows some mild improvement.  The eschar is not there.  He has a little bit of film over it, but the surrounding tissue is healthier-looking, less red, less irritated.  His prealbumin came back within normal limits.  There has been no change in his medications.  Review of systems is negative and he is otherwise doing well.  PHYSICAL EXAMINATION:  GENERAL:  On exam, he is alert, oriented, cooperative, in no acute distress.  He is pleasant to exam. HEENT:  His pupils are equal.  He is planning to have cataract surgery this week. NECK:  No cervical lymphadenopathy. LUNGS:  Clear. HEART:  Regular. ABDOMEN:  Soft.  Wound is as described.  Plan for continued Santyl for this week with hydrogel and Tielle and we will see him back in 1 week.     Wayland Denis, DO     CS/MEDQ  D:  12/25/2010  T:  12/25/2010  Job:  [id number]

## 2011-01-08 ENCOUNTER — Encounter (HOSPITAL_BASED_OUTPATIENT_CLINIC_OR_DEPARTMENT_OTHER): Payer: Medicare Other | Attending: Plastic Surgery

## 2011-01-08 DIAGNOSIS — I10 Essential (primary) hypertension: Secondary | ICD-10-CM | POA: Insufficient documentation

## 2011-01-08 DIAGNOSIS — Z79899 Other long term (current) drug therapy: Secondary | ICD-10-CM | POA: Insufficient documentation

## 2011-01-08 DIAGNOSIS — L97809 Non-pressure chronic ulcer of other part of unspecified lower leg with unspecified severity: Secondary | ICD-10-CM | POA: Insufficient documentation

## 2011-04-30 ENCOUNTER — Other Ambulatory Visit: Payer: Self-pay

## 2011-04-30 DIAGNOSIS — L97909 Non-pressure chronic ulcer of unspecified part of unspecified lower leg with unspecified severity: Secondary | ICD-10-CM

## 2011-05-02 ENCOUNTER — Other Ambulatory Visit: Payer: Self-pay | Admitting: *Deleted

## 2011-05-02 MED ORDER — OMEPRAZOLE 20 MG PO CPDR: 20.0000 mg | DELAYED_RELEASE_CAPSULE | Freq: Every day | ORAL | Status: DC

## 2011-05-29 ENCOUNTER — Encounter: Payer: Medicare Other | Admitting: Vascular Surgery

## 2011-05-29 ENCOUNTER — Other Ambulatory Visit: Payer: Medicare Other

## 2011-06-06 ENCOUNTER — Ambulatory Visit: Payer: Medicare Other | Admitting: Internal Medicine

## 2011-10-15 ENCOUNTER — Other Ambulatory Visit: Payer: Self-pay | Admitting: *Deleted

## 2011-10-15 MED ORDER — OMEPRAZOLE 20 MG PO CPDR: 20.0000 mg | DELAYED_RELEASE_CAPSULE | Freq: Every day | ORAL | Status: DC

## 2012-01-21 ENCOUNTER — Encounter: Payer: Self-pay | Admitting: *Deleted

## 2012-02-13 ENCOUNTER — Encounter: Payer: Self-pay | Admitting: Internal Medicine

## 2012-02-13 ENCOUNTER — Ambulatory Visit (INDEPENDENT_AMBULATORY_CARE_PROVIDER_SITE_OTHER): Payer: Medicare Other | Admitting: Internal Medicine

## 2012-02-13 VITALS — BP 132/74 | HR 60 | Ht 69.0 in | Wt 213.0 lb

## 2012-02-13 DIAGNOSIS — K219 Gastro-esophageal reflux disease without esophagitis: Secondary | ICD-10-CM

## 2012-02-13 MED ORDER — OMEPRAZOLE 20 MG PO CPDR: 20.0000 mg | DELAYED_RELEASE_CAPSULE | Freq: Every day | ORAL | Status: DC

## 2012-02-13 NOTE — Progress Notes (Signed)
MARGO NAULA December 07, 1945 MRN [id number]   History of Present Illness:  This is a 66 year old white male with chronic gastroesophageal reflux who is here today for refills of Prilosec 20 mg a day. He had a Nissen fundoplication in 1994 and since then has been under good control with Prilosec once daily. He has hadd a prior cholecystectomy. He has asbestosis and reflex sympathetic dystrophy. There is a history of colon cancer in his mother. His last colonoscopy in March 2012 showed mild diverticulosis of the left colon. His last upper endoscopy in 2006 showed a functioning Nissen fundoplication and bile reflux. He was dilated with a 54 [place] Maloney dilator.   Past Medical History  Diagnosis Date  . Arthritis   . SOB (shortness of breath)   . Pulmonary asbestosis   . Hernia of unspecified site of abdominal cavity without mention of obstruction or gangrene     hiatal  . GERD (gastroesophageal reflux disease)   . Depression   . Diverticulitis   . Diverticulosis    Past Surgical History  Procedure Date  . Nissen fundoplication   . Right elbow surgery     x 2  . Right knee arthroscopy   . Left shoulder      x 3    reports that he has quit smoking. He has never used smokeless tobacco. He reports that he drinks alcohol. He reports that he does not use illicit drugs. family history includes Colon cancer in his maternal aunt, maternal uncle, and mother. Allergies  Allergen Reactions  . Codeine         Review of Systems:  The remainder of the 10 point ROS is negative except as outlined in H&P   Physical Exam: General appearance  Well developed, in no distress. Eyes- non icteric. HEENT nontraumatic, normocephalic. Mouth no lesions, tongue papillated, no cheilosis. Neck supple without adenopathy, thyroid not enlarged, no carotid bruits, no JVD. Lungs Clear to auscultation bilaterally. Cor normal S1, normal S2, regular rhythm, no murmur,  quiet precordium. Abdomen: Soft,  nontender with normoactive bowel sounds, post cholecystectomy scar. Rectal: Not done. Extremities no pedal edema. Skin no lesions. Neurological alert and oriented x 3. Psychological normal mood and affect.  Assessment and Plan:  Problem #1 Gastroesophageal reflux disease controlled on Prilosec 20 mg daily. He is post Nissen fundoplication in 1994. We will refill his medications. He will continue antireflux measures.  Problem #2 Colorectal screening. His last colonoscopy was in March 2012. A recall colonoscopy will be due in 5 years due to his family history of colon cancer in his mother.  02/13/2012 Lina Sar

## 2012-02-13 NOTE — Patient Instructions (Addendum)
We have sent the following medications to your pharmacy for you to pick up at your convenience: Prilosec 20 mg CC: Dr Benedetto Goad

## 2012-05-21 ENCOUNTER — Encounter: Payer: Self-pay | Admitting: Internal Medicine

## 2012-05-24 ENCOUNTER — Other Ambulatory Visit (INDEPENDENT_AMBULATORY_CARE_PROVIDER_SITE_OTHER): Payer: Medicare Other

## 2012-05-24 ENCOUNTER — Telehealth: Payer: Self-pay | Admitting: Internal Medicine

## 2012-05-24 ENCOUNTER — Ambulatory Visit (INDEPENDENT_AMBULATORY_CARE_PROVIDER_SITE_OTHER): Payer: Medicare Other | Admitting: Internal Medicine

## 2012-05-24 ENCOUNTER — Encounter: Payer: Self-pay | Admitting: Internal Medicine

## 2012-05-24 VITALS — BP 160/82 | HR 94 | Ht 72.0 in | Wt 215.0 lb

## 2012-05-24 DIAGNOSIS — J61 Pneumoconiosis due to asbestos and other mineral fibers: Secondary | ICD-10-CM

## 2012-05-24 DIAGNOSIS — F17200 Nicotine dependence, unspecified, uncomplicated: Secondary | ICD-10-CM

## 2012-05-24 DIAGNOSIS — J449 Chronic obstructive pulmonary disease, unspecified: Secondary | ICD-10-CM

## 2012-05-24 NOTE — Telephone Encounter (Signed)
Spoke with patient and he has jury duty on 06/07/12 in [place]. File number [phone].

## 2012-05-24 NOTE — Patient Instructions (Addendum)
Order- CT chest with contrast   Dx asbestosis             Schedule PFT and 6 MWT

## 2012-05-24 NOTE — Telephone Encounter (Signed)
Letter created for patient.

## 2012-05-24 NOTE — Telephone Encounter (Signed)
Patient states he would like Dr. Juanda Chance to write a note to get him out of jury duty on 06/07/12 due to his IBS. Please, advise.

## 2012-05-24 NOTE — Progress Notes (Signed)
05/24/12- 61 yoM former smoker last seen around 2008 for COPD and asbestosis. 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 Quit smoking around 2007. Particularly since the spring of 2013 he has been more aware of increased dyspnea on exertion to his mailbox and making his bed. Cough occasionally produces phlegm which is white or light green with no blood. Occasional twinges of chest pain, not exertional radiating. Left foot occasionally swells. Rescue inhalers did not help. He has had at least one settlement related to his asbestos exposure and has additional claims pending. History of a fractured left knee. Since then left foot stays red with no history of DVT. He says the diagnosis is reflex sympathetic dystrophy. Since last here he has had cataract surgery. CT chest 01/21/10 IMPRESSION:  1. 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 Findings are most consistent with  asbestosis.  2. Pulmonary arterial hypertension.  Provider: Josetta Huddle   Prior to Admission medications   Medication Sig Start Date End Date Taking? Authorizing Provider  alprazolam Prudy Feeler) 2 MG tablet Take 2 mg by mouth 3 (three) times daily as needed.   Yes Historical Provider, MD  amitriptyline (ELAVIL) 100 MG tablet Take 200 mg by mouth at bedtime.     Yes Historical Provider, MD  aspirin 81 MG tablet Take 81 mg by mouth daily.     Yes Historical Provider, MD  finasteride (PROSCAR) 5 MG tablet Take 5 mg by mouth daily.     Yes Historical Provider, MD  gabapentin (NEURONTIN) 300 MG capsule Take 300 mg by mouth 3 (three) times daily.     Yes Historical Provider, MD  metoprolol (LOPRESSOR) 50 MG tablet Take 50 mg by mouth at bedtime.     Yes Historical Provider, MD  omeprazole (PRILOSEC) 20 MG capsule Take 1 capsule (20 mg total) by mouth daily. 02/13/12  Yes Hart Carwin, MD  vitamin E 400 UNIT capsule Take 400 Units by mouth daily.     Yes Historical Provider, MD   Past Medical History    Diagnosis Date  . Arthritis   . SOB (shortness of breath)   . Pulmonary asbestosis   . Hernia of unspecified site of abdominal cavity without mention of obstruction or gangrene     hiatal  . GERD (gastroesophageal reflux disease)   . Depression   . Diverticulitis     hospital [facility]  . Diverticulosis   . Asthma   . Reflex sympathetic dystrophy   . Asbestosis    Past Surgical History  Procedure Date  . Nissen fundoplication   . Right elbow surgery     x 2  . Right knee arthroscopy   . Left shoulder      x 3  . Squamous cell skin cancer     Left Hand  . Cataract extraction    Family History  Problem Relation Age of Onset  . Colon cancer Mother   . Colon cancer Maternal Aunt   . Colon cancer Maternal Uncle   ' History   Social History  . Marital Status: Widowed    Spouse Name: N/A    Number of Children: N/A  . Years of Education: N/A   Occupational History  . Veteran    Social History Main Topics  . Smoking status: Former Smoker -- 2.5 packs/day for 50 years    Types: Cigarettes    Quit date: 05/05/2005  . Smokeless tobacco: Former Neurosurgeon    Quit date: 05/03/2012  Comment: still chews off an on   . Alcohol Use: Yes     Comment: occasional  . Drug Use: No  . Sexually Active: Not on file     Comment: widowed, Veteran   Other Topics Concern  . Not on file   Social History Narrative  . No narrative on file   ROS-see HPI Constitutional:   No-   weight loss, night sweats, fevers, chills, fatigue, lassitude. HEENT:   No-  headaches, difficulty swallowing, tooth/dental problems, sore throat,       No-  sneezing, itching, ear ache, nasal congestion, post nasal drip,  CV:  No-   chest pain, orthopnea, PND, +swelling in lower extremities, anasarca,                                  dizziness, palpitations Resp: +shortness of breath with exertion or at rest.             +  productive cough,  No non-productive cough,  No- coughing up of blood.               No-   change in color of mucus.  No- wheezing.   Skin: No-   rash or lesions. GI:  No-   heartburn, indigestion, abdominal pain, nausea, vomiting, diarrhea,                 change in bowel habits, loss of appetite GU: No-   dysuria, change in color of urine, no urgency or frequency.  No- flank pain. MS:  No-   joint pain or swelling.  No- decreased range of motion.  No- back pain. Neuro-     nothing unusual Psych:  No- change in mood or affect. No depression or anxiety.  No memory loss.  OBJ- Physical Exam General- Alert, Oriented, Affect-appropriate, Distress- none acute Skin- rash-none, lesions- none, excoriation- none Lymphadenopathy- none Head- atraumatic            Eyes- Gross vision intact, PERRLA, conjunctivae and secretions clear            Ears- Hearing, canals-normal            Nose- Clear, no-Septal dev, mucus, polyps, erosion, perforation             Throat- Mallampati II , mucosa clear , drainage- none, tonsils- atrophic. +Edentulous Neck- flexible , trachea midline, no stridor , thyroid nl, carotid no bruit Chest - symmetrical excursion , unlabored           Heart/CV- RRR , no murmur , no gallop  , no rub, nl s1 s2                           - JVD- none , edema+1 L ankle, stasis changes- none, varices- none. Homans negative           Lung- + diminished with few crackles, wheeze- none, cough- none , dullness-none, rub- none           Chest wall-  Abd- tender-no, distended-no, bowel sounds-present, HSM- no Br/ Gen/ Rectal- Not done, not indicated Extrem- + scaling wound, healing, right posterior calf Neuro- grossly intact to observation

## 2012-05-24 NOTE — Telephone Encounter (Signed)
OK to write a note, but we have to have the date and the juror # and also whether it is in [place] or [place]. He has been treated for chronic diarrhea, urgency with occassional "accidents". He is un ble to serve due to possible bathroom  interruptions during course of the trial.

## 2012-05-25 LAB — BASIC METABOLIC PANEL
CO2: 30 mEq/L (ref 19–32)
Calcium: 9.2 mg/dL (ref 8.4–10.5)
Chloride: 100 mEq/L (ref 96–112)
Glucose, Bld: 95 mg/dL (ref 70–99)
Potassium: 4.9 mEq/L (ref 3.5–5.1)
Sodium: 135 mEq/L (ref 135–145)

## 2012-05-27 ENCOUNTER — Ambulatory Visit (HOSPITAL_COMMUNITY)
Admission: RE | Admit: 2012-05-27 | Discharge: 2012-05-27 | Disposition: A | Discharge status: Home / Self Care | Payer: Medicare Other | Source: Ambulatory Visit | Attending: Internal Medicine | Admitting: Internal Medicine

## 2012-05-27 ENCOUNTER — Encounter (HOSPITAL_COMMUNITY): Payer: Self-pay

## 2012-05-27 DIAGNOSIS — R0602 Shortness of breath: Secondary | ICD-10-CM | POA: Insufficient documentation

## 2012-05-27 DIAGNOSIS — R05 Cough: Secondary | ICD-10-CM | POA: Insufficient documentation

## 2012-05-27 DIAGNOSIS — K7689 Other specified diseases of liver: Secondary | ICD-10-CM | POA: Insufficient documentation

## 2012-05-27 DIAGNOSIS — R059 Cough, unspecified: Secondary | ICD-10-CM | POA: Insufficient documentation

## 2012-05-27 DIAGNOSIS — I2789 Other specified pulmonary heart diseases: Secondary | ICD-10-CM | POA: Insufficient documentation

## 2012-05-27 DIAGNOSIS — J61 Pneumoconiosis due to asbestos and other mineral fibers: Secondary | ICD-10-CM

## 2012-05-27 DIAGNOSIS — M25569 Pain in unspecified knee: Secondary | ICD-10-CM | POA: Insufficient documentation

## 2012-05-27 MED ORDER — IOHEXOL 300 MG/ML  SOLN
80.0000 mL | Freq: Once | INTRAMUSCULAR | Status: AC | PRN
  Administered 2012-05-27: 80 mL via INTRAVENOUS

## 2012-05-27 MED ORDER — ALBUTEROL SULFATE (5 MG/ML) 0.5% IN NEBU
2.5000 mg | INHALATION_SOLUTION | Freq: Once | RESPIRATORY_TRACT | Status: AC
  Administered 2012-05-27: 2.5 mg via RESPIRATORY_TRACT

## 2012-06-05 ENCOUNTER — Encounter: Payer: Self-pay | Admitting: Internal Medicine

## 2012-06-05 NOTE — Assessment & Plan Note (Signed)
Increasing dyspnea on exertion may reflect natural progression of his COPD with time. Plan-PFT

## 2012-06-05 NOTE — Assessment & Plan Note (Signed)
Plan-chest CT with contrast I

## 2012-07-05 ENCOUNTER — Ambulatory Visit: Payer: Medicare Other | Admitting: Internal Medicine

## 2012-08-05 ENCOUNTER — Ambulatory Visit: Payer: Medicare Other | Admitting: Internal Medicine

## 2012-09-13 ENCOUNTER — Ambulatory Visit: Payer: Medicare Other | Admitting: Internal Medicine

## 2012-09-24 ENCOUNTER — Ambulatory Visit: Payer: Medicare Other | Admitting: Internal Medicine

## 2012-10-31 ENCOUNTER — Emergency Department (HOSPITAL_COMMUNITY): Payer: Medicare Other

## 2012-10-31 ENCOUNTER — Observation Stay (HOSPITAL_COMMUNITY): Payer: Medicare Other

## 2012-10-31 ENCOUNTER — Observation Stay (HOSPITAL_COMMUNITY)
Admission: EM | Admit: 2012-10-31 | Discharge: 2012-11-02 | Disposition: A | Discharge status: Home / Self Care | Payer: Medicare Other | Attending: Internal Medicine | Admitting: Internal Medicine

## 2012-10-31 ENCOUNTER — Encounter (HOSPITAL_COMMUNITY): Payer: Self-pay | Admitting: Emergency Medicine

## 2012-10-31 DIAGNOSIS — R079 Chest pain, unspecified: Secondary | ICD-10-CM | POA: Insufficient documentation

## 2012-10-31 DIAGNOSIS — Z79899 Other long term (current) drug therapy: Secondary | ICD-10-CM | POA: Insufficient documentation

## 2012-10-31 DIAGNOSIS — J449 Chronic obstructive pulmonary disease, unspecified: Secondary | ICD-10-CM

## 2012-10-31 DIAGNOSIS — N179 Acute kidney failure, unspecified: Secondary | ICD-10-CM | POA: Insufficient documentation

## 2012-10-31 DIAGNOSIS — R209 Unspecified disturbances of skin sensation: Secondary | ICD-10-CM | POA: Insufficient documentation

## 2012-10-31 DIAGNOSIS — J61 Pneumoconiosis due to asbestos and other mineral fibers: Secondary | ICD-10-CM | POA: Diagnosis present

## 2012-10-31 DIAGNOSIS — F17200 Nicotine dependence, unspecified, uncomplicated: Secondary | ICD-10-CM

## 2012-10-31 DIAGNOSIS — K219 Gastro-esophageal reflux disease without esophagitis: Secondary | ICD-10-CM

## 2012-10-31 DIAGNOSIS — R0989 Other specified symptoms and signs involving the circulatory and respiratory systems: Secondary | ICD-10-CM

## 2012-10-31 DIAGNOSIS — R55 Syncope and collapse: Secondary | ICD-10-CM

## 2012-10-31 DIAGNOSIS — I951 Orthostatic hypotension: Principal | ICD-10-CM | POA: Insufficient documentation

## 2012-10-31 DIAGNOSIS — G9050 Complex regional pain syndrome I, unspecified: Secondary | ICD-10-CM

## 2012-10-31 LAB — POCT I-STAT, CHEM 8
Calcium, Ion: 1.19 mmol/L (ref 1.13–1.30)
HCT: 41 % (ref 39.0–52.0)
Hemoglobin: 13.9 g/dL (ref 13.0–17.0)
TCO2: 26 mmol/L (ref 0–100)

## 2012-10-31 LAB — POCT I-STAT TROPONIN I: Troponin i, poc: 0.01 ng/mL (ref 0.00–0.08)

## 2012-10-31 LAB — CBC
MCH: 30.1 pg (ref 26.0–34.0)
MCHC: 34 g/dL (ref 30.0–36.0)
MCV: 88.5 fL (ref 78.0–100.0)
Platelets: 344 10*3/uL (ref 150–400)

## 2012-10-31 MED ORDER — VITAMIN E 180 MG (400 UNIT) PO CAPS
400.0000 [IU] | ORAL_CAPSULE | Freq: Every day | ORAL | Status: DC
  Administered 2012-11-01: 400 [IU] via ORAL
  Filled 2012-10-31 (×2): qty 1

## 2012-10-31 MED ORDER — FINASTERIDE 5 MG PO TABS
5.0000 mg | ORAL_TABLET | Freq: Every day | ORAL | Status: DC
  Administered 2012-11-01 (×2): 5 mg via ORAL
  Filled 2012-10-31 (×4): qty 1

## 2012-10-31 MED ORDER — ASPIRIN 81 MG PO CHEW
81.0000 mg | CHEWABLE_TABLET | Freq: Every day | ORAL | Status: DC
  Administered 2012-11-01: 81 mg via ORAL

## 2012-10-31 MED ORDER — AMITRIPTYLINE HCL 100 MG PO TABS: 200.0000 mg | ORAL_TABLET | Freq: Every day | ORAL | Status: DC

## 2012-10-31 MED ORDER — ASPIRIN 81 MG PO CHEW
243.0000 mg | CHEWABLE_TABLET | Freq: Once | ORAL | Status: AC
  Administered 2012-10-31: 243 mg via ORAL
  Filled 2012-10-31: qty 3

## 2012-10-31 MED ORDER — SODIUM CHLORIDE 0.9 % IV BOLUS (SEPSIS)
1000.0000 mL | Freq: Once | INTRAVENOUS | Status: AC
  Administered 2012-10-31: 1000 mL via INTRAVENOUS

## 2012-10-31 MED ORDER — METOPROLOL TARTRATE 50 MG PO TABS: 50.0000 mg | ORAL_TABLET | Freq: Two times a day (BID) | ORAL | Status: DC

## 2012-10-31 MED ORDER — ASPIRIN 81 MG PO TABS: 81.0000 mg | ORAL_TABLET | Freq: Every day | ORAL | Status: DC

## 2012-10-31 MED ORDER — SODIUM CHLORIDE 0.9 % IJ SOLN: 3.0000 mL | Freq: Two times a day (BID) | INTRAMUSCULAR | Status: DC

## 2012-10-31 MED ORDER — PANTOPRAZOLE SODIUM 40 MG PO TBEC
40.0000 mg | DELAYED_RELEASE_TABLET | Freq: Every day | ORAL | Status: DC
  Administered 2012-11-01: 40 mg via ORAL
  Filled 2012-10-31: qty 1

## 2012-10-31 MED ORDER — GABAPENTIN 300 MG PO CAPS
300.0000 mg | ORAL_CAPSULE | Freq: Three times a day (TID) | ORAL | Status: DC
  Administered 2012-11-01 (×4): 300 mg via ORAL
  Filled 2012-10-31 (×8): qty 1

## 2012-10-31 MED ORDER — METOPROLOL TARTRATE 50 MG PO TABS
50.0000 mg | ORAL_TABLET | Freq: Two times a day (BID) | ORAL | Status: DC
  Administered 2012-11-01 (×3): 50 mg via ORAL
  Filled 2012-10-31 (×6): qty 1

## 2012-10-31 MED ORDER — HEPARIN SODIUM (PORCINE) 5000 UNIT/ML IJ SOLN
5000.0000 [IU] | Freq: Three times a day (TID) | INTRAMUSCULAR | Status: DC
  Filled 2012-10-31: qty 1

## 2012-10-31 MED ORDER — HEPARIN SODIUM (PORCINE) 5000 UNIT/ML IJ SOLN
5000.0000 [IU] | Freq: Three times a day (TID) | INTRAMUSCULAR | Status: DC
  Administered 2012-11-01 – 2012-11-02 (×3): 5000 [IU] via SUBCUTANEOUS
  Filled 2012-10-31 (×7): qty 1

## 2012-10-31 MED ORDER — ALPRAZOLAM 0.5 MG PO TABS
2.0000 mg | ORAL_TABLET | Freq: Three times a day (TID) | ORAL | Status: DC | PRN
  Administered 2012-11-01 (×2): 2 mg via ORAL
  Filled 2012-10-31 (×2): qty 4
  Filled 2012-10-31: qty 1

## 2012-10-31 MED ORDER — GABAPENTIN 300 MG PO CAPS: 300.0000 mg | ORAL_CAPSULE | Freq: Three times a day (TID) | ORAL | Status: DC

## 2012-10-31 MED ORDER — AMITRIPTYLINE HCL 100 MG PO TABS
200.0000 mg | ORAL_TABLET | Freq: Every day | ORAL | Status: DC
  Administered 2012-11-01: 200 mg via ORAL
  Filled 2012-10-31 (×3): qty 2

## 2012-10-31 MED ORDER — SODIUM CHLORIDE 0.9 % IV SOLN
INTRAVENOUS | Status: DC
  Administered 2012-11-01: via INTRAVENOUS

## 2012-10-31 NOTE — ED Notes (Signed)
Pt reports when he stands up he gets lightheaded and sees dots, sts it started last week and has gradually gotten worse. sts he has had some numbness in left fingers intermittent, started last week also. C/o "light" pain in central chest, sts he has had this for a really long time d/t apeidoses, on going since 2005. No slurred speech, no facial droop, denies issues walking and speaking this past week, son sts he has been his normal self except for the hand numbness and lightheaded feeling.

## 2012-10-31 NOTE — ED Notes (Signed)
Per Femi, Mini Lab tech - fecal occult negative

## 2012-10-31 NOTE — ED Notes (Signed)
Pt reports numbness to finger tips, chest pain and seeing spots for about a week.

## 2012-10-31 NOTE — ED Provider Notes (Signed)
History    CSN: [id number] Arrival date & time 10/31/12  1628  First MD Initiated Contact with Patient 10/31/12 1843     Chief Complaint  Patient presents with  . Numbness  . Chest Pain   patient complains of numbness in his left hand and lightheadedness. In seeing spots in front of his eyes for the past 2 days. Somewhat worse with standing. Nonexertional. He denies loss of use of his hand or difficulty speaking denies difficulty with gait. Denies other complaint. He also complains of chest pain and dyspnea which is his typical chest pain which she's had for years left-sided anterior, chronic and unchanged which is attributable to asbestosis. Numbness and scotomata are new over the past 2 days. No other complaint. No treatment prior to coming here. Patient is presently asymptomatic. (Consider location/radiation/quality/duration/timing/severity/associated sxs/prior Treatment) Patient is a 67 y.o. male presenting with chest pain.  Chest Pain Associated symptoms: dizziness, numbness and shortness of breath    Past Medical History  Diagnosis Date  . Arthritis   . SOB (shortness of breath)   . Pulmonary asbestosis   . Hernia of unspecified site of abdominal cavity without mention of obstruction or gangrene     hiatal  . GERD (gastroesophageal reflux disease)   . Depression   . Diverticulitis     hospital 2011 [facility]  . Diverticulosis   . Asthma   . Reflex sympathetic dystrophy   . 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 Nissen fundoplication    . Right elbow surgery      x 2  . Right knee arthroscopy    . Left shoulder       x 3  . Squamous cell skin cancer      Left Hand  . Cataract extraction     reflex sympathetic dystrophy left lower extremity Family History  Problem Relation Age of Onset  . Colon cancer Mother   . Colon cancer Maternal Aunt   . Colon cancer Maternal Uncle    History  Substance Use Topics  . Smoking status: Former  Smoker -- 2.50 packs/day for 50 years    Types: Cigarettes    Quit date: 05/05/2005  . Smokeless tobacco: Former Neurosurgeon    Quit date: 05/03/2012  . Alcohol Use: No     Comment: last use 2013    Review of Systems  Constitutional: Negative.   HENT: Negative.   Eyes: Positive for visual disturbance.       Scotomata  Respiratory: Positive for shortness of breath.   Cardiovascular: Positive for chest pain.  Gastrointestinal: Negative.   Musculoskeletal: Negative.   Skin: Negative.   Neurological: Positive for dizziness and numbness.       Lightheadedness with standing chronic limp favoring left leg  Psychiatric/Behavioral: Negative.   All other systems reviewed and are negative.    Allergies  Codeine  Home Medications   Current Outpatient Rx  Name  Route  Sig  Dispense  Refill  . alprazolam (XANAX) 2 MG tablet   Oral   Take 2 mg by mouth 3 (three) times daily as needed.         [name] amitriptyline (ELAVIL) 100 MG tablet   Oral   Take 200 mg by mouth at bedtime.           [name] aspirin 81 MG tablet   Oral   Take 81 mg by mouth daily.           [name]  finasteride (PROSCAR) 5 MG tablet   Oral   Take 5 mg by mouth daily.           [name] gabapentin (NEURONTIN) 300 MG capsule   Oral   Take 300 mg by mouth 3 (three) times daily.           . metoprolol (LOPRESSOR) 50 MG tablet   Oral   Take 50 mg by mouth at bedtime.           [name] omeprazole (PRILOSEC) 20 MG capsule   Oral   Take 1 capsule (20 mg total) by mouth daily.   90 capsule   3   . vitamin E 400 UNIT capsule   Oral   Take 400 Units by mouth daily.            BP 119/63  Pulse 102  Temp(Src) 97.5 F (36.4 C) (Oral)  Resp 16  SpO2 97% Physical Exam  Nursing note and vitals reviewed. Constitutional: He is oriented to person, place, and time. He appears well-developed and well-nourished.  HENT:  Head: Normocephalic and atraumatic.  Eyes: Conjunctivae are normal. Pupils are equal, round, and reactive to  light.  Neck: Neck supple. No tracheal deviation present. No thyromegaly present.  Cardiovascular: Normal rate and regular rhythm.   No murmur heard. Pulmonary/Chest: Effort normal and breath sounds normal.  Abdominal: Soft. Bowel sounds are normal. He exhibits no distension. There is no tenderness.  Genitourinary: Rectum normal.  Grossly brown stool no gross blood present, nontender  Musculoskeletal: Normal range of motion. He exhibits no edema and no tenderness.  Neurological: He is alert and oriented to person, place, and time. No cranial nerve deficit. Coordination normal.  Finger to nose normal becomes lightheaded on standing  Skin: Skin is warm and dry. No rash noted.  Psychiatric: He has a normal mood and affect.    ED Course  Procedures (including critical care time) Labs Reviewed  POCT I-STAT, CHEM 8 - Abnormal; Notable for the following:    Creatinine, Ser 1.70 (*)    Glucose, Bld 115 (*)    All other components within normal limits  POCT I-STAT TROPONIN I   No results found. No diagnosis found.  Date: 10/31/2012  Rate: 95  Rhythm: normal sinus rhythm  QRS Axis: normal  Intervals: normal  ST/T Wave abnormalities: normal  Conduction Disutrbances:none  Narrative Interpretation:   Old EKG Reviewed: No significant change from 02/17/2006 interpreted by me Results for orders placed during the hospital encounter of 10/31/12  CBC      Result Value Range   WBC 11.7 (*) 4.0 - 10.5 K/uL   RBC 4.35  4.22 - 5.81 MIL/uL   Hemoglobin 13.1  13.0 - 17.0 g/dL   HCT 16.1 (*) 09.6 - 04.5 %   MCV 88.5  78.0 - 100.0 fL   MCH 30.1  26.0 - 34.0 pg   MCHC 34.0  30.0 - 36.0 g/dL   RDW 40.9  81.1 - 91.4 %   Platelets 344  150 - 400 K/uL  POCT I-STAT, CHEM 8      Result Value Range   Sodium 135  135 - 145 mEq/L   Potassium 4.2  3.5 - 5.1 mEq/L   Chloride 99  96 - 112 mEq/L   BUN 13  6 - 23 mg/dL   Creatinine, Ser 7.82 (*) 0.50 - 1.35 mg/dL   Glucose, Bld 956 (*) 70 - 99 mg/dL    Calcium, Ion 2.13  0.86 -  1.30 mmol/L   TCO2 26  0 - 100 mmol/L   Hemoglobin 13.9  13.0 - 17.0 g/dL   HCT 19.1  47.8 - 29.5 %  POCT I-STAT TROPONIN I      Result Value Range   Troponin i, poc 0.01  0.00 - 0.08 ng/mL   Comment 3            Ct Head Wo Contrast  10/31/2012   *RADIOLOGY REPORT*  Clinical Data: Dizziness beginning 1 week ago.  CT HEAD WITHOUT CONTRAST  Technique:  Contiguous axial images were obtained from the base of the skull through the vertex without contrast.  Comparison: None.  Findings: There is some hypoattenuation scattered in the subcortical and periventricular deep white matter compatible with chronic microvascular ischemic change.  No evidence of acute intracranial abnormality including infarction, hemorrhage, mass lesion, mass effect, midline shift or abnormal extra-axial fluid collection is identified.  No hydrocephalus or pneumocephalus.  The calvarium is intact.  Imaged paranasal sinuses and mastoid air cells are clear.  IMPRESSION: No acute finding.   Original Report Authenticated By: Holley Dexter, M.D.    MDM  Patient has orthostatic hypotension which is causing TIA-like symptoms. Spoke with Dr. Julian Reil Plan telemetry 23 hour observation. .Diagnoses #1 orthostatic hypotension #2 TIA #3 renal insufficiency    Doug Sou, MD 10/31/12 2128

## 2012-10-31 NOTE — ED Notes (Signed)
Attempted report 

## 2012-10-31 NOTE — H&P (Signed)
[facility] History and Physical  Curtis [name] ZOX:[id number] DOB: 1945-10-08 DOA: 10/31/2012  Referring physician: ED PCP: Pamelia Hoit, MD   Chief Complaint: ED  HPI: Curtis [name] is a 67 y.o. male who presents with 2 day history of episodic numbness in R hand, light headedness, and seeing spots in front of his eyes.  Symptoms are worse with standing and better when sitting down, do not appear to be associated with exertion.  Currently asymptomatic.  After discussions with patient regarding dehydration he does note he sweats a lot when working out side and was sweating a lot after he got finished mowing the grass 2 days ago.  In the ED orthostatic vital sighs were markedly positive, creatinine was 1.7 (baseline 1.4) hospitalist asked to admit.  Review of Systems: 12 systems reviewed and otherwise negative.  Past Medical History  Diagnosis Date  . Arthritis   . SOB (shortness of breath)   . Pulmonary asbestosis   . Hernia of unspecified site of abdominal cavity without mention of obstruction or gangrene     hiatal  . GERD (gastroesophageal reflux disease)   . Depression   . Diverticulitis     hospital 2011 [facility]  . Diverticulosis   . Asthma   . Reflex sympathetic dystrophy   . 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 Nissen fundoplication    . Right elbow surgery      x 2  . Right knee arthroscopy    . Left shoulder       x 3  . Squamous cell skin cancer      Left Hand  . Cataract extraction     Social History:  reports that he quit smoking about 7 years ago. His smoking use included Cigarettes. He has a 125 pack-year smoking history. He quit smokeless tobacco use about 5 months ago. He reports that he does not drink alcohol or use illicit drugs.   Allergies  Allergen Reactions  . Codeine Itching    Family History  Problem Relation Age of Onset  . Colon cancer Mother   . Colon cancer Maternal Aunt   . Colon cancer  Maternal Uncle     Prior to Admission medications   Medication Sig Start Date End Date Taking? Authorizing Provider  alprazolam Prudy Feeler) 2 MG tablet Take 2 mg by mouth 3 (three) times daily as needed for sleep or anxiety.    Yes Historical Provider, MD  amitriptyline (ELAVIL) 100 MG tablet Take 200 mg by mouth at bedtime.     Yes Historical Provider, MD  aspirin 81 MG tablet Take 81 mg by mouth daily.     Yes Historical Provider, MD  finasteride (PROSCAR) 5 MG tablet Take 5 mg by mouth at bedtime.    Yes Historical Provider, MD  gabapentin (NEURONTIN) 300 MG capsule Take 300 mg by mouth 3 (three) times daily.    Yes Historical Provider, MD  metoprolol (LOPRESSOR) 50 MG tablet Take 50 mg by mouth 2 (two) times daily.    Yes Historical Provider, MD  omeprazole (PRILOSEC) 20 MG capsule Take 20 mg by mouth daily.   Yes Historical Provider, MD  vitamin E 400 UNIT capsule Take 400 Units by mouth daily.     Yes Historical Provider, MD   Physical Exam: Filed Vitals:   10/31/12 1929 10/31/12 1930 10/31/12 2030 10/31/12 2119  BP: 107/60 91/47 161/81 158/74  Pulse: 79 102 90 87  Temp:  TempSrc:      Resp:    20  SpO2:   99% 95%    General:  NAD, resting comfortably in bed Eyes: PEERLA EOMI ENT: mucous membranes moist Neck: supple w/o JVD Cardiovascular: RRR w/o MRG Respiratory: CTA B Abdomen: soft, nt, nd, bs+ Skin: no rash nor lesion Musculoskeletal: MAE, full ROM all 4 extremities Psychiatric: normal tone and affect Neurologic: AAOx3, grossly non-focal  Labs on Admission:  Basic Metabolic Panel:  Recent Labs Lab 10/31/12 1717  NA 135  K 4.2  CL 99  GLUCOSE 115*  BUN 13  CREATININE 1.70*   Liver Function Tests: No results found for this basename: AST, ALT, ALKPHOS, BILITOT, PROT, ALBUMIN,  in the last 168 hours No results found for this basename: LIPASE, AMYLASE,  in the last 168 hours No results found for this basename: AMMONIA,  in the last 168 hours CBC:  Recent  Labs Lab 10/31/12 1717 10/31/12 1930  WBC  --  11.7*  HGB 13.9 13.1  HCT 41.0 38.5*  MCV  --  88.5  PLT  --  344   Cardiac Enzymes: No results found for this basename: CKTOTAL, CKMB, CKMBINDEX, TROPONINI,  in the last 168 hours  BNP (last 3 results) No results found for this basename: PROBNP,  in the last 8760 hours CBG: No results found for this basename: GLUCAP,  in the last 168 hours  Radiological Exams on Admission: Ct Head Wo Contrast  10/31/2012   *RADIOLOGY REPORT*  Clinical Data: Dizziness beginning 1 week ago.  CT HEAD WITHOUT CONTRAST  Technique:  Contiguous axial images were obtained from the base of the skull through the vertex without contrast.  Comparison: None.  Findings: There is some hypoattenuation scattered in the subcortical and periventricular deep white matter compatible with chronic microvascular ischemic change.  No evidence of acute intracranial abnormality including infarction, hemorrhage, mass lesion, mass effect, midline shift or abnormal extra-axial fluid collection is identified.  No hydrocephalus or pneumocephalus.  The calvarium is intact.  Imaged paranasal sinuses and mastoid air cells are clear.  IMPRESSION: No acute finding.   Original Report Authenticated By: Holley Dexter, BurkeD.    EKG: Independently reviewed.  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 Orthostatic hypotension - positive orthostatic vitals in ED, rehydrating patient as first step as dehydration is #1 on the DDX.  Have also ordered 2d echo and carotid dopplers at this point.  Believe that his TIA like symptom description is stemming from this finding of orthostatic hypotension and dropping his cerebral perfusion.  If however his TIA like symptoms re-occur then will need MRI/MRA head added to his work up have held off on this for now given his markedly positive vitals in the ED and frank history of fluid loss via sweating.   If orthostatic hypotension persists despite fluids then should get tilt table testing. 2. Asbestosis - ordering CXR, at this point appears to be at baseline.    Code Status: Full Code (must indicate code status--if unknown or must be presumed, indicate so) Family Communication: Spoke with son at bedside (indicate person spoken with, if applicable, with phone number if by telephone) Disposition Plan: Admit to inpatient (indicate anticipated LOS)  Time spent: 70 min  Curtis [name], Curtis M. [facility] Pager [phone]  If 7PM-7AM, please contact night-coverage [email or web address] Password [facility] 10/31/2012, 10:40 PM

## 2012-11-01 ENCOUNTER — Other Ambulatory Visit: Payer: Self-pay | Admitting: Internal Medicine

## 2012-11-01 DIAGNOSIS — I951 Orthostatic hypotension: Secondary | ICD-10-CM

## 2012-11-01 DIAGNOSIS — N179 Acute kidney failure, unspecified: Secondary | ICD-10-CM

## 2012-11-01 LAB — URINALYSIS, ROUTINE W REFLEX MICROSCOPIC
Ketones, ur: 15 mg/dL — AB
Nitrite: POSITIVE — AB
Protein, ur: NEGATIVE mg/dL
Urobilinogen, UA: 1 mg/dL (ref 0.0–1.0)
pH: 5.5 (ref 5.0–8.0)

## 2012-11-01 LAB — BASIC METABOLIC PANEL
BUN: 11 mg/dL (ref 6–23)
Calcium: 8.4 mg/dL (ref 8.4–10.5)
Chloride: 96 mEq/L (ref 96–112)
Creatinine, Ser: 1.21 mg/dL (ref 0.50–1.35)
GFR calc Af Amer: 69 mL/min — ABNORMAL LOW (ref >90)
GFR calc non Af Amer: 60 mL/min — ABNORMAL LOW (ref >90)

## 2012-11-01 LAB — CBC
HCT: 33 % — ABNORMAL LOW (ref 39.0–52.0)
MCH: 29.7 pg (ref 26.0–34.0)
MCHC: 33.9 g/dL (ref 30.0–36.0)
MCV: 87.5 fL (ref 78.0–100.0)
Platelets: 268 10*3/uL (ref 150–400)
RDW: 14.4 % (ref 11.5–15.5)
WBC: 7.7 10*3/uL (ref 4.0–10.5)

## 2012-11-01 LAB — URINE MICROSCOPIC-ADD ON

## 2012-11-01 LAB — OCCULT BLOOD, POC DEVICE: Fecal Occult Bld: NEGATIVE

## 2012-11-01 MED ORDER — DIPHENOXYLATE-ATROPINE 2.5-0.025 MG PO TABS: 1.0000 | ORAL_TABLET | Freq: Four times a day (QID) | ORAL | Status: DC | PRN

## 2012-11-01 MED ORDER — SODIUM CHLORIDE 0.9 % IV SOLN
INTRAVENOUS | Status: AC
  Administered 2012-11-01 (×2): via INTRAVENOUS

## 2012-11-01 MED ORDER — CIPROFLOXACIN HCL 500 MG PO TABS
500.0000 mg | ORAL_TABLET | Freq: Two times a day (BID) | ORAL | Status: DC
  Filled 2012-11-01 (×3): qty 1

## 2012-11-01 MED ORDER — CIPROFLOXACIN HCL 500 MG PO TABS
500.0000 mg | ORAL_TABLET | Freq: Two times a day (BID) | ORAL | Status: DC
  Administered 2012-11-01 – 2012-11-02 (×3): 500 mg via ORAL
  Filled 2012-11-01 (×5): qty 1

## 2012-11-01 MED ORDER — SODIUM CHLORIDE 0.9 % IV BOLUS (SEPSIS)
500.0000 mL | Freq: Once | INTRAVENOUS | Status: AC
  Administered 2012-11-01: 500 mL via INTRAVENOUS

## 2012-11-01 NOTE — Progress Notes (Signed)
[facility] PROGRESS NOTE  Assessment/Plan: Orthostatic hypotension: - Bolus NS. Cont IV fluids. diarrhea resolved before coming to the hospital . D/c antibiotics. - Creatinine improved. - get PT. - d/c elevail can cuase orthostatic hypotension - recheck orthostatics.  AKI: - resolved with IV fluids. - give Bolus NS.   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    Code Status: Full Code Family Communication: Spoke with son at bedside Disposition Plan: Admit to inpatient    Consultants:  none  Procedures:  Echo   Antibiotics: cipro 6.29.2014-6.30.2016  HPI/Subjective: Relates lightheadedness mildly improved. No diarrhea  Objective: Filed Vitals:   10/31/12 2119 10/31/12 2200 10/31/12 2317 11/01/12 0400  BP: 158/74 169/85 184/83 175/84  Pulse: 87 91 92 73  Temp:   98.4 F (36.9 C) 98.2 F (36.8 C)  TempSrc:      Resp: 20  20 18   Height:   6' (1.829 m)   Weight:   93 kg (205 lb 0.4 oz)   SpO2: 95% 96% 99% 95%    Intake/Output Summary (Last 24 hours) at 11/01/12 0715 Last data filed at 10/31/12 2317  Gross per 24 hour  Intake      0 ml  Output      1 ml  Net     -1 ml   Filed Weights   10/31/12 2317  Weight: 93 kg (205 lb 0.4 oz)    Exam:  General: Alert, awake, oriented x3, in no acute distress.  HEENT: No bruits, no goiter.  Heart: Regular rate and rhythm, without murmurs, rubs, gallops.  Lungs: Good air movement, bilateral air movement.  Abdomen: Soft, nontender, nondistended, positive bowel sounds.  Neuro: Grossly intact, nonfocal.   Data Reviewed: Basic Metabolic Panel:  Recent Labs Lab 10/31/12 1717 11/01/12 0504  NA 135 132*  K 4.2 4.2  CL 99 96  CO2  --  23  GLUCOSE 115* 102*  BUN 13 11  CREATININE 1.70* 1.21  CALCIUM  --  8.4   Liver Function Tests: No results found for this basename: AST, ALT, ALKPHOS, BILITOT, PROT, ALBUMIN,  in the last 168 hours No results found for this basename: LIPASE, AMYLASE,  in the last  168 hours No results found for this basename: AMMONIA,  in the last 168 hours CBC:  Recent Labs Lab 10/31/12 1717 10/31/12 1930 11/01/12 0504  WBC  --  11.7* 7.7  HGB 13.9 13.1 11.2*  HCT 41.0 38.5* 33.0*  MCV  --  88.5 87.5  PLT  --  344 268   Cardiac Enzymes: No results found for this basename: CKTOTAL, CKMB, CKMBINDEX, TROPONINI,  in the last 168 hours BNP (last 3 results) No results found for this basename: PROBNP,  in the last 8760 hours CBG: No results found for this basename: GLUCAP,  in the last 168 hours  No results found for this or any previous visit (from the past 240 hour(s)).   Studies: Dg Chest 2 View  10/31/2012   *RADIOLOGY REPORT*  Clinical Data: Syncope, history of asbestos lung disease  CHEST - 2 VIEW  Comparison: Prior CT from 05/27/2012 as well as earlier studies  Findings: The cardiac and mediastinal silhouettes are stable in size and contour.  The trachea is bowed to the right, unchanged.  The lung volumes remain diminished.  Cyst related pleural disease along the lateral right hemithorax is similar as compared to prior studies. Neural disease along the left hemithorax is also similar. There has been interval worsening of left  basilar opacity as compared to the prior radiograph, which may represent a superimposed infection.  There is no evidence of pleural effusion or overt pulmonary edema.  No pneumothorax.  The osseous structures are unchanged.  IMPRESSION: Interval worsening of left basilar opacity with similar appearance of left mid and lower lung opacity, worrisome for superimposed infection or aspiration given the history of syncope.  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   Ct Head Wo Contrast  10/31/2012   *RADIOLOGY REPORT*  Clinical Data: Dizziness beginning 1 week ago.  CT HEAD WITHOUT CONTRAST  Technique:  Contiguous axial images were obtained from the  base of the skull through the vertex without contrast.  Comparison: None.  Findings: There is some hypoattenuation scattered in the subcortical and periventricular deep white matter compatible with chronic microvascular ischemic change.  No evidence of acute intracranial abnormality including infarction, hemorrhage, mass lesion, mass effect, midline shift or abnormal extra-axial fluid collection is identified.  No hydrocephalus or pneumocephalus.  The calvarium is intact.  Imaged paranasal sinuses and mastoid air cells are clear.  IMPRESSION: No acute finding.   Original Report Authenticated By: Holley Dexter, M.D.    Scheduled Meds: . aspirin  81 mg Oral Daily  . finasteride  5 mg Oral QHS  . gabapentin  300 mg Oral TID  . heparin  5,000 Units Subcutaneous Q8H  . metoprolol  50 mg Oral BID  . pantoprazole  40 mg Oral Daily  . sodium chloride  500 mL Intravenous Once  . sodium chloride  3 mL Intravenous Q12H  . vitamin E  400 Units Oral Daily   Continuous Infusions: . sodium chloride 100 mL/hr at 11/01/12 0018     Marinda Elk  [facility] Pager [phone]. If 8PM-8AM, please contact night-coverage at [email or web address], password [facility] 11/01/2012, 7:15 AM  LOS: 1 day

## 2012-11-01 NOTE — Progress Notes (Signed)
  Echocardiogram 2D Echocardiogram has been performed.  Cathie Beams 11/01/2012, 4:07 PM

## 2012-11-01 NOTE — Progress Notes (Signed)
Physical Therapy Evaluation Patient Details Name: Curtis [name] MRN: [id number] DOB: 11-11-1945 Today's Date: 11/01/2012 Time: 4098-1191 PT Time Calculation (min): 26 min  PT Assessment / Plan / Recommendation History of Present Illness  pt admitted with dehydration and orthostatic hypotension  Clinical Impression  Pt is 67 yo male with dehydration and hypotension who is much improved this afternoon but has had diarrhea all day and experiencing generalized weakness. Recommend acute PT to help with strengthening and increasing mobility. Recommend HHPT at d/c.    PT Assessment  Patient needs continued PT services    Follow Up Recommendations  Home health PT    Does the patient have the potential to tolerate intense rehabilitation      Barriers to Discharge   lives alone but seems to have good support from son    Equipment Recommendations  None recommended by PT    Recommendations for Other Services     Frequency Min 3X/week    Precautions / Restrictions Precautions Precautions: Fall Restrictions Weight Bearing Restrictions: No   Pertinent Vitals/Pain BP sitting 130/ 75 Standing 128/70      Mobility  Bed Mobility Bed Mobility: Sit to Supine Sit to Supine: 7: Independent Transfers Transfers: Sit to Stand;Stand to Sit Sit to Stand: 5: Supervision;From chair/3-in-1;From toilet Stand to Sit: To bed;To chair/3-in-1;To toilet;5: Supervision Details for Transfer Assistance: pt stands without physical assist and waits to see if he is light headed. He was not symptomatic on eval Ambulation/Gait Ambulation/Gait Assistance: 4: Min guard Ambulation Distance (Feet): 150 Feet Assistive device: None;Other (Comment) (IV pole for part of time) Ambulation/Gait Assistance Details: pt began pushing IV pole but it was in his way so then no AD, antalgic gait due to LLE. After 100', pt began to fatigue and had one mild LOB with self-correction and encouraged to push IV pole again which  helped. He will likely need to use [facility] again for awhile upon initial d/c. Agreeable to this. Gait Pattern: Step-through pattern;Decreased stance time - left;Decreased step length - left;Decreased weight shift to left;Decreased dorsiflexion - left Gait velocity: decreased Stairs: No Wheelchair Mobility Wheelchair Mobility: No    Exercises Other Exercises Other Exercises: compensation strategies taught for OS including ankle pumps and leg exercises before getting up and frequent small meals throughout the day   PT Diagnosis: Abnormality of gait;Generalized weakness;Difficulty walking  PT Problem List: Decreased strength;Decreased range of motion;Decreased activity tolerance;Decreased balance;Decreased mobility;Decreased coordination PT Treatment Interventions: DME instruction;Gait training;Stair training;Functional mobility training;Therapeutic activities;Therapeutic exercise;Balance training;Patient/family education     PT Goals(Current goals can be found in the care plan section) Acute Rehab PT Goals Patient Stated Goal: return home PT Goal Formulation: With patient Time For Goal Achievement: 11/15/12 Potential to Achieve Goals: Good  Visit Information  Last PT Received On: 11/01/12 Assistance Needed: +1 History of Present Illness: pt admitted with dehydration and orthostatic hypotension       Prior Functioning  Home Living Family/patient expects to be discharged to:: Private residence Living Arrangements: Alone Available Help at Discharge: Family;Available PRN/intermittently Type of Home: House Home Access: Stairs to enter [employer] of Steps: 4 Entrance Stairs-Rails: Right Home Layout: One level Home Equipment: Cane - single point Additional Comments: pt did not use his cane regularly. 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 Son lives next door and helps if needed Communication Communication: No difficulties Dominant Hand: Right    Cognition  Cognition Arousal/Alertness: Awake/alert Behavior During Therapy: WFL for tasks assessed/performed Overall Cognitive Status: Within Functional Limits for tasks assessed    Extremity/Trunk Assessment Upper Extremity Assessment Upper Extremity Assessment: Overall WFL for tasks assessed Lower Extremity Assessment Lower Extremity Assessment: LLE deficits/detail;Generalized weakness;RLE deficits/detail RLE Deficits / Details: generalized weakness from diarrhea all day today LLE Deficits / Details: decreased active df and mild quad weakness, exacerbated by generalized weakness today LLE Sensation: decreased light touch LLE Coordination: decreased gross motor Cervical / Trunk Assessment Cervical / Trunk Assessment: Kyphotic   Balance Balance Balance Assessed: Yes Dynamic Standing Balance Dynamic Standing - Balance Support: No upper extremity supported;During functional activity Dynamic Standing - Level of Assistance: 5: Stand by assistance  End of Session PT - End of Session Equipment Utilized During Treatment: Gait belt Activity Tolerance: Patient limited by fatigue Patient left: in bed (going to ECHO) Nurse Communication: Other (comment) (BP)  GP Functional Assessment Tool Used: clinical judgement Functional Limitation: Mobility: Walking and moving around Mobility: Walking and Moving Around Current Status ([id number]): At least 1 percent but less than 20 percent impaired, limited or restricted Mobility: Walking and Moving Around Goal Status [phone]): 0 percent impaired, limited or restricted  Lyanne Co, PT  Acute Rehab Services  [phone]  Lyanne Co 11/01/2012, 3:35 PM

## 2012-11-02 DIAGNOSIS — I951 Orthostatic hypotension: Secondary | ICD-10-CM

## 2012-11-02 LAB — URINE CULTURE: Colony Count: 8000

## 2012-11-02 LAB — BASIC METABOLIC PANEL
CO2: 27 mEq/L (ref 19–32)
Chloride: 102 mEq/L (ref 96–112)
Creatinine, Ser: 1.15 mg/dL (ref 0.50–1.35)
GFR calc Af Amer: 74 mL/min — ABNORMAL LOW (ref >90)
Potassium: 4.7 mEq/L (ref 3.5–5.1)

## 2012-11-02 LAB — CLOSTRIDIUM DIFFICILE BY PCR: Toxigenic C. Difficile by PCR: NEGATIVE

## 2012-11-02 MED ORDER — AMITRIPTYLINE HCL 100 MG PO TABS: 100.0000 mg | ORAL_TABLET | Freq: Every day | ORAL | Status: DC

## 2012-11-02 NOTE — Discharge Summary (Addendum)
Physician Discharge Summary  Curtis [name] ZOX:[id number] DOB: Mar 15, 1946 DOA: 10/31/2012  PCP: Pamelia Hoit, MD  Admit date: 10/31/2012 Discharge date: 11/02/2012  Time spent: 35 minutes  Recommendations for Outpatient Follow-up:  1. Follow up with PCP in 2 weeks. Check BP and titrate antihypertensive as tolerate it.  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 male who presents with 2 day history of episodic numbness in R hand, light headedness, and seeing spots in front of his eyes. Symptoms are worse with standing and better when sitting down, do not appear to be associated with exertion. Currently asymptomatic. After discussions with patient regarding dehydration he does note he sweats a lot when working out side and was sweating a lot after he got finished mowing the grass 2 days ago.  In the ED orthostatic vital sighs were markedly positive, creatinine was 1.7 (baseline 1.4) hospitalist asked to admit.   Hospital Course:  Orthostatic hypotension:  - started on cipro and Bolus NS with IV fluids on admission. Diarrhea resolved before coming to the hospital he does have IBS . D/c antibiotics remained afebrile no leukocytosis. - Creatinine improved.  - PT recommended H/H. - d/c elevail can cuase orthostatic hypotension. Resume as an outpatient at a lower dose.  AKI:  - resolved with IV fluids.  - Cr at baseline.  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   Procedures: Echo 7.1.2014: Systolic function was vigorous. The estimated ejection fraction was in the range of 65% to 70%    Consultations:  none  Discharge Exam: Filed Vitals:   11/02/12 0437 11/02/12 0439 11/02/12 0441 11/02/12 0444  BP: 163/75  175/80 152/78 168/86  Pulse: 71 80 74 79  Temp: 98.4 F (36.9 C)     TempSrc: Oral     Resp: 18 18    Height:      Weight:      SpO2: 97% 98% 100% 100%    General: A&O x3 Cardiovascular: RRR Respiratory: good air movement CTA B/L  Discharge Instructions      Discharge Orders   Future Orders Complete By Expires     Diet - low sodium heart healthy  As directed     Increase activity slowly  As directed         Medication List         alprazolam 2 MG tablet  Commonly known as:  XANAX  Take 2 mg by mouth 3 (three) times daily as needed for sleep or anxiety.     amitriptyline 100 MG tablet  Commonly known as:  ELAVIL  Take 1 tablet (100 mg total) by mouth at bedtime.     aspirin 81 MG tablet  Take 81 mg by mouth daily.     finasteride 5 MG tablet  Commonly known as:  PROSCAR  Take 5 mg by mouth at bedtime.     gabapentin 300 MG capsule  Commonly known as:  NEURONTIN  Take 300 mg by mouth 3 (three) times daily.     metoprolol 50 MG tablet  Commonly known as:  LOPRESSOR  Take 50 mg by mouth 2 (two) times daily.     omeprazole 20 MG capsule  Commonly known as:  PRILOSEC  Take 1 capsule by mouth  daily     vitamin E 400 UNIT capsule  Take 400 Units by mouth daily.       Allergies  Allergen Reactions  . Codeine Itching   Follow-up Information   Follow up with Pamelia Hoit, MD In 2 weeks. (hospital follow up)    Contact information:   [address] [place] [address] [phone]        The results of significant diagnostics from this hospitalization (including imaging, microbiology, ancillary and laboratory) are listed below for reference.    Significant Diagnostic Studies: Dg Chest 2 View  10/31/2012   *RADIOLOGY REPORT*  Clinical Data: Syncope, history of asbestos lung disease  CHEST - 2 VIEW  Comparison: Prior CT from 05/27/2012 as well as earlier studies  Findings: The cardiac and mediastinal silhouettes are stable in size and contour.   The trachea is bowed to the right, unchanged.  The lung volumes remain diminished.  Cyst related pleural disease along the lateral right hemithorax is similar as compared to prior studies. Neural disease along the left hemithorax is also similar. There has been interval worsening of left basilar opacity as compared to the prior radiograph, which may represent a superimposed infection.  There is no evidence of pleural effusion or overt pulmonary edema.  No pneumothorax.  The osseous structures are unchanged.  IMPRESSION: Interval worsening of left basilar opacity with similar appearance of left mid and lower lung opacity, worrisome for superimposed infection or aspiration given the history of syncope.  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   Ct Head Wo Contrast  10/31/2012   *RADIOLOGY REPORT*  Clinical Data: Dizziness beginning 1 week ago.  CT HEAD WITHOUT CONTRAST  Technique:  Contiguous axial images were obtained from the base of the skull through the vertex without contrast.  Comparison: None.  Findings: There is some hypoattenuation scattered in the subcortical and periventricular deep white matter compatible with chronic microvascular ischemic change.  No evidence of acute intracranial abnormality including infarction, hemorrhage, mass lesion, mass effect, midline shift or abnormal extra-axial fluid collection is identified.  No hydrocephalus or pneumocephalus.  The calvarium is intact.  Imaged paranasal sinuses and mastoid air cells are clear.  IMPRESSION: No acute finding.   Original Report Authenticated By: Holley Dexter, M.D.    Microbiology: No results found for this or any previous visit (from the past 240 hour(s)).   Labs: Basic Metabolic Panel:  Recent Labs Lab 10/31/12 1717 11/01/12 0504 11/02/12 0457  NA 135 132* 137  K 4.2 4.2 4.7  CL 99 96 102  CO2  --  23 27  GLUCOSE 115*  102* 98  BUN 13 11 9   CREATININE 1.70* 1.21 1.15  CALCIUM  --  8.4 9.1   Liver Function Tests: No results found for this basename: AST, ALT, ALKPHOS, BILITOT, PROT, ALBUMIN,  in the last 168 hours No results found for this basename: LIPASE, AMYLASE,  in the last 168 hours No results found for this basename: AMMONIA,  in the last 168 hours CBC:  Recent Labs Lab 10/31/12 1717 10/31/12 1930 11/01/12 0504  WBC  --  11.7* 7.7  HGB 13.9 13.1 11.2*  HCT 41.0 38.5* 33.0*  MCV  --  88.5 87.5  PLT  --  344 268   Cardiac Enzymes: No results found for this basename: CKTOTAL, CKMB, CKMBINDEX, TROPONINI,  in the last 168 hours  BNP: BNP (last 3 results) No results found for this basename: PROBNP,  in the last 8760 hours CBG: No results found for this basename: GLUCAP,  in the last 168 hours     Signed:  Marinda Elk  [facility] 11/02/2012, 7:17 AM

## 2012-11-02 NOTE — Care Management Note (Signed)
CM did speak to pt before he was d/c. Pt is refusing HH PT services at this time. Son was in the room at the time of conversation and is aware that he has refused services. Gala Lewandowsky, RN,BSN [phone]

## 2013-01-03 ENCOUNTER — Other Ambulatory Visit: Payer: Self-pay | Admitting: Internal Medicine

## 2013-06-29 ENCOUNTER — Other Ambulatory Visit: Payer: Self-pay | Admitting: *Deleted

## 2013-06-29 MED ORDER — OMEPRAZOLE 20 MG PO CPDR
DELAYED_RELEASE_CAPSULE | ORAL | Status: DC
Start: 2013-06-29 — End: 2013-12-14

## 2013-07-29 ENCOUNTER — Encounter: Payer: Medicare Other | Admitting: Vascular Surgery

## 2013-07-29 ENCOUNTER — Encounter (HOSPITAL_COMMUNITY): Payer: Medicare Other

## 2013-09-05 ENCOUNTER — Emergency Department (HOSPITAL_COMMUNITY): Payer: Medicare Other

## 2013-09-05 ENCOUNTER — Inpatient Hospital Stay (HOSPITAL_COMMUNITY)
Admission: EM | Admit: 2013-09-05 | Discharge: 2013-09-08 | DRG: 069 | Disposition: A | Discharge status: Home Health | Payer: Medicare Other | Attending: Internal Medicine | Admitting: Internal Medicine

## 2013-09-05 ENCOUNTER — Encounter (HOSPITAL_COMMUNITY): Payer: Self-pay | Admitting: Emergency Medicine

## 2013-09-05 DIAGNOSIS — F329 Major depressive disorder, single episode, unspecified: Secondary | ICD-10-CM | POA: Diagnosis present

## 2013-09-05 DIAGNOSIS — K219 Gastro-esophageal reflux disease without esophagitis: Secondary | ICD-10-CM

## 2013-09-05 DIAGNOSIS — E860 Dehydration: Secondary | ICD-10-CM | POA: Diagnosis present

## 2013-09-05 DIAGNOSIS — G459 Transient cerebral ischemic attack, unspecified: Principal | ICD-10-CM | POA: Diagnosis present

## 2013-09-05 DIAGNOSIS — Z87891 Personal history of nicotine dependence: Secondary | ICD-10-CM

## 2013-09-05 DIAGNOSIS — Z85828 Personal history of other malignant neoplasm of skin: Secondary | ICD-10-CM

## 2013-09-05 DIAGNOSIS — Z7982 Long term (current) use of aspirin: Secondary | ICD-10-CM

## 2013-09-05 DIAGNOSIS — Z6826 Body mass index (BMI) 26.0-26.9, adult: Secondary | ICD-10-CM

## 2013-09-05 DIAGNOSIS — N179 Acute kidney failure, unspecified: Secondary | ICD-10-CM | POA: Diagnosis present

## 2013-09-05 DIAGNOSIS — J61 Pneumoconiosis due to asbestos and other mineral fibers: Secondary | ICD-10-CM | POA: Diagnosis present

## 2013-09-05 DIAGNOSIS — I10 Essential (primary) hypertension: Secondary | ICD-10-CM | POA: Diagnosis present

## 2013-09-05 DIAGNOSIS — K589 Irritable bowel syndrome without diarrhea: Secondary | ICD-10-CM | POA: Diagnosis present

## 2013-09-05 DIAGNOSIS — R0989 Other specified symptoms and signs involving the circulatory and respiratory systems: Secondary | ICD-10-CM

## 2013-09-05 DIAGNOSIS — I671 Cerebral aneurysm, nonruptured: Secondary | ICD-10-CM | POA: Diagnosis present

## 2013-09-05 DIAGNOSIS — F17200 Nicotine dependence, unspecified, uncomplicated: Secondary | ICD-10-CM

## 2013-09-05 DIAGNOSIS — I959 Hypotension, unspecified: Secondary | ICD-10-CM | POA: Diagnosis present

## 2013-09-05 DIAGNOSIS — E785 Hyperlipidemia, unspecified: Secondary | ICD-10-CM | POA: Diagnosis present

## 2013-09-05 DIAGNOSIS — J449 Chronic obstructive pulmonary disease, unspecified: Secondary | ICD-10-CM

## 2013-09-05 DIAGNOSIS — G90529 Complex regional pain syndrome I of unspecified lower limb: Secondary | ICD-10-CM | POA: Diagnosis present

## 2013-09-05 DIAGNOSIS — E781 Pure hyperglyceridemia: Secondary | ICD-10-CM | POA: Diagnosis present

## 2013-09-05 DIAGNOSIS — R0609 Other forms of dyspnea: Secondary | ICD-10-CM

## 2013-09-05 DIAGNOSIS — G9050 Complex regional pain syndrome I, unspecified: Secondary | ICD-10-CM

## 2013-09-05 DIAGNOSIS — F3289 Other specified depressive episodes: Secondary | ICD-10-CM | POA: Diagnosis present

## 2013-09-05 DIAGNOSIS — N400 Enlarged prostate without lower urinary tract symptoms: Secondary | ICD-10-CM | POA: Diagnosis present

## 2013-09-05 DIAGNOSIS — M129 Arthropathy, unspecified: Secondary | ICD-10-CM | POA: Diagnosis present

## 2013-09-05 DIAGNOSIS — I951 Orthostatic hypotension: Secondary | ICD-10-CM | POA: Diagnosis present

## 2013-09-05 HISTORY — DX: Essential (primary) hypertension: I10

## 2013-09-05 HISTORY — DX: Irritable bowel syndrome, unspecified: K58.9

## 2013-09-05 LAB — BASIC METABOLIC PANEL
BUN: 15 mg/dL (ref 6–23)
CO2: 28 mEq/L (ref 19–32)
Chloride: 91 mEq/L — ABNORMAL LOW (ref 96–112)
GFR calc non Af Amer: 47 mL/min — ABNORMAL LOW (ref >90)
Glucose, Bld: 104 mg/dL — ABNORMAL HIGH (ref 70–99)
Potassium: 4.7 mEq/L (ref 3.7–5.3)
Sodium: 132 mEq/L — ABNORMAL LOW (ref 137–147)

## 2013-09-05 LAB — CBC
HCT: 40.1 % (ref 39.0–52.0)
Hemoglobin: 13.9 g/dL (ref 13.0–17.0)
MCH: 30.6 pg (ref 26.0–34.0)
MCHC: 34.7 g/dL (ref 30.0–36.0)
MCV: 88.3 fL (ref 78.0–100.0)
Platelets: 361 K/uL (ref 150–400)
RBC: 4.54 MIL/uL (ref 4.22–5.81)
RDW: 13.8 % (ref 11.5–15.5)
WBC: 7.5 10*3/uL (ref 4.0–10.5)

## 2013-09-05 LAB — I-STAT TROPONIN, ED: Troponin i, poc: 0.02 ng/mL (ref 0.00–0.08)

## 2013-09-05 LAB — BASIC METABOLIC PANEL WITH GFR
Calcium: 10 mg/dL (ref 8.4–10.5)
Creatinine, Ser: 1.48 mg/dL — ABNORMAL HIGH (ref 0.50–1.35)
GFR calc Af Amer: 54 mL/min — ABNORMAL LOW (ref >90)

## 2013-09-05 LAB — POC OCCULT BLOOD, ED: Fecal Occult Bld: NEGATIVE

## 2013-09-05 LAB — TROPONIN I

## 2013-09-05 MED ORDER — FINASTERIDE 5 MG PO TABS
5.0000 mg | ORAL_TABLET | Freq: Every day | ORAL | Status: DC
  Administered 2013-09-06 – 2013-09-08 (×3): 5 mg via ORAL
  Filled 2013-09-05 (×3): qty 1

## 2013-09-05 MED ORDER — ACETAMINOPHEN 650 MG RE SUPP: 650.0000 mg | Freq: Four times a day (QID) | RECTAL | Status: DC | PRN

## 2013-09-05 MED ORDER — PANTOPRAZOLE SODIUM 40 MG PO TBEC
40.0000 mg | DELAYED_RELEASE_TABLET | Freq: Every day | ORAL | Status: DC
  Administered 2013-09-06 – 2013-09-08 (×3): 40 mg via ORAL
  Filled 2013-09-05 (×3): qty 1

## 2013-09-05 MED ORDER — SODIUM CHLORIDE 0.9 % IJ SOLN
3.0000 mL | Freq: Two times a day (BID) | INTRAMUSCULAR | Status: DC
  Administered 2013-09-05 – 2013-09-07 (×2): 3 mL via INTRAVENOUS

## 2013-09-05 MED ORDER — GABAPENTIN 300 MG PO CAPS
300.0000 mg | ORAL_CAPSULE | Freq: Three times a day (TID) | ORAL | Status: DC
  Administered 2013-09-05 – 2013-09-08 (×7): 300 mg via ORAL
  Filled 2013-09-05 (×10): qty 1

## 2013-09-05 MED ORDER — ONDANSETRON HCL 4 MG/2ML IJ SOLN: 4.0000 mg | Freq: Four times a day (QID) | INTRAMUSCULAR | Status: DC | PRN

## 2013-09-05 MED ORDER — ASPIRIN EC 81 MG PO TBEC
81.0000 mg | DELAYED_RELEASE_TABLET | Freq: Every day | ORAL | Status: DC
  Administered 2013-09-05 – 2013-09-06 (×2): 81 mg via ORAL
  Filled 2013-09-05 (×3): qty 1

## 2013-09-05 MED ORDER — HYDRALAZINE HCL 20 MG/ML IJ SOLN
10.0000 mg | INTRAMUSCULAR | Status: DC | PRN
  Administered 2013-09-06: 10 mg via INTRAVENOUS
  Filled 2013-09-05: qty 1

## 2013-09-05 MED ORDER — ONDANSETRON HCL 4 MG PO TABS: 4.0000 mg | ORAL_TABLET | Freq: Four times a day (QID) | ORAL | Status: DC | PRN

## 2013-09-05 MED ORDER — NORTRIPTYLINE HCL 25 MG PO CAPS
50.0000 mg | ORAL_CAPSULE | Freq: Every day | ORAL | Status: DC
  Administered 2013-09-05 – 2013-09-07 (×3): 50 mg via ORAL
  Filled 2013-09-05 (×4): qty 2

## 2013-09-05 MED ORDER — SODIUM CHLORIDE 0.9 % IV SOLN
INTRAVENOUS | Status: DC
  Administered 2013-09-05 – 2013-09-06 (×2): via INTRAVENOUS

## 2013-09-05 MED ORDER — ACETAMINOPHEN 325 MG PO TABS: 650.0000 mg | ORAL_TABLET | Freq: Four times a day (QID) | ORAL | Status: DC | PRN

## 2013-09-05 MED ORDER — ASPIRIN 81 MG PO TABS: 81.0000 mg | ORAL_TABLET | Freq: Every evening | ORAL | Status: DC

## 2013-09-05 MED ORDER — ENOXAPARIN SODIUM 40 MG/0.4ML ~~LOC~~ SOLN
40.0000 mg | SUBCUTANEOUS | Status: DC
Start: 2013-09-05 — End: 2013-09-08
  Administered 2013-09-05 – 2013-09-07 (×3): 40 mg via SUBCUTANEOUS
  Filled 2013-09-05 (×4): qty 0.4

## 2013-09-05 MED ORDER — SODIUM CHLORIDE 0.9 % IV BOLUS (SEPSIS)
1000.0000 mL | Freq: Once | INTRAVENOUS | Status: AC
  Administered 2013-09-05: 1000 mL via INTRAVENOUS

## 2013-09-05 MED ORDER — ALPRAZOLAM 0.5 MG PO TABS
2.0000 mg | ORAL_TABLET | Freq: Two times a day (BID) | ORAL | Status: DC
  Administered 2013-09-05 – 2013-09-08 (×6): 2 mg via ORAL
  Filled 2013-09-05 (×6): qty 4

## 2013-09-05 NOTE — ED Notes (Signed)
Pt called out reporting sudden SOB. Pt denies chest pain, but reports RUQ abdominal pain. Pt states that he was feeling constipated this past week. Reports last bowel movement was today.

## 2013-09-05 NOTE — H&P (Signed)
[facility] History and Physical  Curtis [name] YQM:[id number] DOB: 08/20/1945 DOA: 09/05/2013  Referring physician: ER physician. PCP: [name], MD  Chief Complaint: Lightheaded.  HPI: Curtis [name] is a 68 y.o. 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 Patient had gone to followup with his psychiatrist last week and was found to have high blood pressure. He was referred to his primary care and was started on losartan hydrochlorothiazide combination 100/12.5 mg daily. Patient started feeling lightheaded over the last 24 hours but did not lose consciousness. Denies any focal deficits incontinence of urine headache difficulty talking or swallowing. Had some blurry vision for a few seconds in his left eye which happened in the last year when he had orthostatic changes. In the ER patient was initially found to be hypotensive with systolic blood pressure in the 60s and was given 1 L normal saline bolus. Following which his blood pressure improved but on standing patient was orthostatic and has been admitted for further management. Patient denies any chest pain or shortness of breath. Patient does have history of irritable bowel syndrome and has alternating diarrhea with constipation and has had diarrhea last week.   Review of Systems: As presented in the history of presenting illness, rest negative.  Past Medical History  Diagnosis Date  . Arthritis   . SOB (shortness of breath)   . Pulmonary asbestosis   . Hernia of unspecified site of abdominal cavity without mention of obstruction or gangrene     hiatal  . GERD (gastroesophageal reflux disease)   . Depression   . Diverticulitis     hospital 2011 [facility]  . Diverticulosis   . Reflex sympathetic dystrophy   . 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 Hypertension   . IBS (irritable  bowel syndrome)    Past Surgical History  Procedure Laterality Date  . Nissen fundoplication    . Right elbow surgery      x 2  . Right knee arthroscopy    . Left shoulder       x 3  . Squamous cell skin cancer      Left Hand  . Cataract extraction     Social History:  reports that he quit smoking about 8 years ago. His smoking use included Cigarettes. He has a 125 pack-year smoking history. He quit smokeless tobacco use about 16 months ago. He reports that he does not drink alcohol or use illicit drugs. Where does patient live home. Can patient participate in ADLs? Yes.  Allergies  Allergen Reactions  . Codeine Itching    Family History:  Family History  Problem Relation Age of Onset  . Colon cancer Mother   . Colon cancer Maternal Aunt   . Colon cancer Maternal Uncle       Prior to Admission medications   Medication Sig Start Date End Date Taking? Authorizing Provider  alprazolam [name]) 2 MG tablet Take 2 mg by mouth 2 (two) times daily.   Yes Historical Provider, MD  aspirin 81 MG tablet Take 81 mg by mouth every evening.    Yes Historical Provider, MD  finasteride (PROSCAR) 5 MG tablet Take 5 mg by mouth every morning.    Yes Historical Provider, MD  gabapentin (NEURONTIN) 300 MG capsule Take 300 mg by mouth 3 (three) times daily.    Yes Historical Provider, MD  losartan-hydrochlorothiazide Konrad Penta)  100-12.5 MG per tablet Take 1 tablet by mouth every morning.   Yes Historical Provider, MD  metoprolol (LOPRESSOR) 50 MG tablet Take 50 mg by mouth 2 (two) times daily.    Yes Historical Provider, MD  nortriptyline (PAMELOR) 50 MG capsule Take 50 mg by mouth at bedtime.   Yes Historical Provider, MD  omeprazole (PRILOSEC) 20 MG capsule Take 1 capsule by mouth  daily 06/29/13  Yes Lafayette Dragon, MD    Physical Exam: Filed Vitals:   09/05/13 1931 09/05/13 1945 09/05/13 2000 09/05/13 2015  BP: 136/82 160/72 141/89 163/78  Pulse:  65 67 70  Temp:      TempSrc:      Resp:  22 17 16 14   Height:      Weight:      SpO2: 99% 100% 98% 97%     General:  Well-developed and nourished.  Eyes: Anicteric no pallor.  ENT: No discharge from the ears eyes nose mouth.  Neck: No mass felt.  Cardiovascular: S1-S2 heard.  Respiratory: No rhonchi or crepitations.  Abdomen: Soft nontender bowel sounds present no guarding rigidity.  Skin: No rash.  Musculoskeletal: No edema.  Psychiatric: Appears normal.  Neurologic: Alert awake oriented to time place and person. Moves all extremities.  Labs on Admission:  Basic Metabolic Panel:  Recent Labs Lab 09/05/13 1649  NA 132*  K 4.7  CL 91*  CO2 28  GLUCOSE 104*  BUN 15  CREATININE 1.48*  CALCIUM 10.0   Liver Function Tests: No results found for this basename: AST, ALT, ALKPHOS, BILITOT, PROT, ALBUMIN,  in the last 168 hours No results found for this basename: LIPASE, AMYLASE,  in the last 168 hours No results found for this basename: AMMONIA,  in the last 168 hours CBC:  Recent Labs Lab 09/05/13 1649  WBC 7.5  HGB 13.9  HCT 40.1  MCV 88.3  PLT 361   Cardiac Enzymes: No results found for this basename: CKTOTAL, CKMB, CKMBINDEX, TROPONINI,  in the last 168 hours  BNP (last 3 results) No results found for this basename: PROBNP,  in the last 8760 hours CBG: No results found for this basename: GLUCAP,  in the last 168 hours  Radiological Exams on Admission: Dg Chest 2 View  09/05/2013   CLINICAL DATA:  Shortness of breath. Pulmonary asbestosis. Hypertension and dizziness.  EXAM: CHEST  2 VIEW  COMPARISON:  10/31/2012  FINDINGS: Two view exam shows persistent underlying chronic interstitial disease with a peripheral predominance. No overt pulmonary edema at this time. Stable asymmetric elevation of the left hemidiaphragm. The cardio pericardial silhouette is enlarged. Bones are diffusely demineralized.  IMPRESSION: Chronic interstitial disease with a peripheral predominance. There is more focal  pleural-parenchymal opacity in the left lower lung, likely related to scarring given the stability. Evolving pneumonia would be difficult to exclude on this study secondary to the prominent underlying chronic lung disease.   Electronically Signed   By: Misty Stanley M.D.   On: 09/05/2013 18:40    EKG: Independently reviewed. Normal sinus rhythm.  Assessment/Plan Principal Problem:   Postural hypotension Active Problems:   REFLEX SYMPATHETIC DYSTROPHY   Orthostatic hypotension   AKI (acute kidney injury)   HTN (hypertension)   1. Orthostatic hypotension - hold antihypertensives for now and continue with gentle hydration. Check cortisol levels and plasma metanephrine to check for Addisons disease and pheochromocytoma. 2. Acute on chronic renal failure - probably from dehydration and being on HCTZ and Losartan which are on  hold. Check UA. Closely follow intake output and metabolic panel. 3. Depression - patient's Remeron was discontinued last week due to sleep disturbance. Continue other medications for now. 4. ReFlex sympathetic dystrophy.    Code Status: Full code.  Family Communication: Patient's son at the bedside.  Disposition Plan: Admit for observation.    [facility] Pager [phone].  If 7PM-7AM, please contact night-coverage [email or web address] Password [email or web address] 09/05/2013, 8:45 PM

## 2013-09-05 NOTE — ED Notes (Signed)
Hal Hope, MD is aware of the pt's anxiety.

## 2013-09-05 NOTE — ED Notes (Signed)
[place], MD at bedside 

## 2013-09-05 NOTE — ED Provider Notes (Signed)
CSN: [id number]     Arrival date & time 09/05/13  1617 History   First MD Initiated Contact with Patient 09/05/13 1657     Chief Complaint  Patient presents with  . Hypertension  . Dizziness     (Consider location/radiation/quality/duration/timing/severity/associated sxs/prior Treatment) HPI Comments: Approximately one week ago, the patient was seen by his psychiatrist for routine visit. There during his visit, his blood pressure was found to be 210/100. Patient already takes metoprolol at baseline. His psychiatrist recommended that he be seen by his primary care physician for his elevated blood pressure. Patient's son took him to see Dr. Redmond Pulling immediately. There Dr. Redmond Pulling prescribed him a new antihypertensive which he began approximately 5 or 6 days ago. Showed thereafter, the patient has endorsed weakness, episodes of near syncope, exertional weakness and dizziness. Patient reports he's had some intermittent chest pains, diarrhea which are his baseline. He has not had any chest pain the last 3 days. He took a laxative several days ago do to subjective constipation. He denies any black or bloody stools. No nausea or vomiting. He also reports his appetite has decreased. He denies any exacerbation of his depression.  Patient is a 68 y.o. male presenting with dizziness and weakness. The history is provided by the patient and a relative.  Dizziness Quality:  Lightheadedness Severity:  Severe Onset quality:  Gradual Duration:  5 days Timing:  Constant Progression:  Waxing and waning Chronicity:  New Context: medication   Relieved by:  Lying down Worsened by:  Standing up Associated symptoms: no blood in stool, no diarrhea, no nausea, no shortness of breath and no vomiting   Risk factors: new medications   Risk factors: no anemia   Weakness This is a new problem. Pertinent negatives include no shortness of breath.    Past Medical History  Diagnosis Date  . Arthritis   . SOB (shortness  of breath)   . Pulmonary asbestosis   . Hernia of unspecified site of abdominal cavity without mention of obstruction or gangrene     hiatal  . GERD (gastroesophageal reflux disease)   . Depression   . Diverticulitis     hospital 2011 [facility]  . Diverticulosis   . Reflex sympathetic dystrophy   . 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 Hypertension   . IBS (irritable bowel syndrome)    Past Surgical History  Procedure Laterality Date  . Nissen fundoplication    . Right elbow surgery      x 2  . Right knee arthroscopy    . Left shoulder       x 3  . Squamous cell skin cancer      Left Hand  . Cataract extraction     Family History  Problem Relation Age of Onset  . Colon cancer Mother   . Colon cancer Maternal Aunt   . Colon cancer Maternal Uncle    History  Substance Use Topics  . Smoking status: Former Smoker -- 2.50 packs/day for 50 years    Types: Cigarettes    Quit date: 05/05/2005  . Smokeless tobacco: Former Systems developer    Quit date: 05/03/2012  . Alcohol Use: No     Comment: last use 2013    Review of Systems  Constitutional: Positive for fatigue.  HENT: Negative for congestion.   Respiratory: Negative for shortness of breath.   Gastrointestinal: Negative for nausea, vomiting, diarrhea and blood in stool.  Musculoskeletal: Negative for back pain.  Neurological: Positive for dizziness and weakness.  Near syncopal episodes  All other systems reviewed and are negative.     Allergies  Codeine  Home Medications   Prior to Admission medications   Medication Sig Start Date End Date Taking? Authorizing Provider  alprazolam [name]) 2 MG tablet Take 2 mg by mouth 3 (three) times daily as needed for sleep or anxiety.     Historical Provider, MD  amitriptyline (ELAVIL) 100 MG tablet Take 1 tablet (100 mg total) by mouth at bedtime. 11/02/12   Charlynne Cousins, MD  aspirin 81 MG tablet Take 81 mg by mouth daily.      Historical Provider, MD  finasteride (PROSCAR) 5 MG tablet Take 5  mg by mouth at bedtime.     Historical Provider, MD  gabapentin (NEURONTIN) 300 MG capsule Take 300 mg by mouth 3 (three) times daily.     Historical Provider, MD  metoprolol (LOPRESSOR) 50 MG tablet Take 50 mg by mouth 2 (two) times daily.     Historical Provider, MD  omeprazole (PRILOSEC) 20 MG capsule Take 1 capsule by mouth  daily 06/29/13   Lafayette Dragon, MD  vitamin E 400 UNIT capsule Take 400 Units by mouth daily.      Historical Provider, MD   BP 136/82  Pulse 63  Temp(Src) 97.4 F (36.3 C) (Oral)  Resp 22  Ht 6' (1.829 m)  Wt 202 lb (91.627 kg)  BMI 27.39 kg/m2  SpO2 99% Physical Exam  Nursing note and vitals reviewed. Constitutional: He is oriented to person, place, and time. He appears well-developed and well-nourished.  Non-toxic appearance. He does not have a sickly appearance. He appears ill. No distress.  HENT:  Head: Normocephalic and atraumatic.  Mouth/Throat: Oropharynx is clear and moist.  Eyes: Conjunctivae and EOM are normal. No scleral icterus.  Neck: Normal range of motion. Neck supple. No JVD present.  Cardiovascular: Normal rate, regular rhythm and intact distal pulses.   Pulmonary/Chest: Effort normal. No respiratory distress. He has no wheezes.  Abdominal: Soft. He exhibits no distension. There is no tenderness. There is no rebound and no guarding.  Musculoskeletal: He exhibits no edema.  Neurological: He is alert and oriented to person, place, and time.  Skin: Skin is warm. He is not diaphoretic. There is pallor.  Psychiatric: His mood appears not anxious. His speech is not rapid and/or pressured. He is slowed. He does not express impulsivity. He does not exhibit a depressed mood. He exhibits normal recent memory and normal remote memory.    ED Course  Procedures (including critical care time) Labs Review Labs Reviewed  BASIC METABOLIC PANEL - Abnormal; Notable for the following:    Sodium 132 (*)    Chloride 91 (*)    Glucose, Bld 104 (*)     Creatinine, Ser 1.48 (*)    GFR calc non Af Amer 47 (*)    GFR calc Af Amer 54 (*)    All other components within normal limits  CBC  I-STAT TROPOININ, ED  POC OCCULT BLOOD, ED    Imaging Review Dg Chest 2 View  09/05/2013   CLINICAL DATA:  Shortness of breath. Pulmonary asbestosis. Hypertension and dizziness.  EXAM: CHEST  2 VIEW  COMPARISON:  10/31/2012  FINDINGS: Two view exam shows persistent underlying chronic interstitial disease with a peripheral predominance. No overt pulmonary edema at this time. Stable asymmetric elevation of the left hemidiaphragm. The cardio pericardial silhouette is enlarged. Bones are diffusely demineralized.  IMPRESSION: Chronic interstitial disease with a peripheral  predominance. There is more focal pleural-parenchymal opacity in the left lower lung, likely related to scarring given the stability. Evolving pneumonia would be difficult to exclude on this study secondary to the prominent underlying chronic lung disease.   Electronically Signed   By: Misty Stanley M.D.   On: 09/05/2013 18:40     EKG Interpretation   Date/Time:  Monday Sep 05 2013 16:51:05 EDT Ventricular Rate:  73 PR Interval:  188 QRS Duration: 90 QT Interval:  404 QTC Calculation: 445 R Axis:   32 Text Interpretation:  Normal sinus rhythm Minimal voltage criteria for  LVH, may be normal variant Borderline ECG Abnormal ekg Confirmed by [facility]   MD, Hoover Browns ([id number]) on 09/05/2013 5:33:54 PM     Room air saturation is 99%, interpretation normal   7:31 PM Pt is receiving IVF's, will admit for hypotension for continued IVF's and monitoring for improvement of symptoms, likely just observation  7:38 PM Will admit to [facility], Dr. Hal Hope to observation status for continued IVF's and monitoring.    MDM   Final diagnoses:  Postural hypotension    Patient appears a little pale, was initially hypotensive. Orthostatic testing is extremely positive. This seems very coincidental with the  patient beginning a new combination antihypertensive. Most likely, the medication is what is causing his symptoms. However we'll also rule out of MI, obtain blood to light panel, check for severe anemia and occult Hemoccult. IV fluids will be given for resuscitation, possible admission given the degree of his hypotension.    Saddie Benders. Dorna Mai, MD 09/05/13 [id number]

## 2013-09-05 NOTE — ED Notes (Addendum)
Pt reports going to dr on wed and was hypertensive sbp >200, was started on a new bp medication and hasnt felt well since then. Pt feeling lightheaded, dizzy, appears pale at triage. bp 92/43 at triage. Also reports chest pressure and difficulty urinating, hx of enlarged prostate.

## 2013-09-05 NOTE — ED Notes (Signed)
Pt states that he is no longer short of breath, and states that he thinks he had a panic attack. Pt is requesting something to calm his nerves.

## 2013-09-06 ENCOUNTER — Inpatient Hospital Stay (HOSPITAL_COMMUNITY): Payer: Medicare Other

## 2013-09-06 DIAGNOSIS — G459 Transient cerebral ischemic attack, unspecified: Secondary | ICD-10-CM | POA: Diagnosis present

## 2013-09-06 DIAGNOSIS — I959 Hypotension, unspecified: Secondary | ICD-10-CM | POA: Diagnosis present

## 2013-09-06 LAB — CBC WITH DIFFERENTIAL/PLATELET
BASOS PCT: 1 % (ref 0–1)
Basophils Absolute: 0.1 10*3/uL (ref 0.0–0.1)
Eosinophils Absolute: 0.3 10*3/uL (ref 0.0–0.7)
Eosinophils Relative: 5 % (ref 0–5)
HEMATOCRIT: 36.7 % — AB (ref 39.0–52.0)
HEMOGLOBIN: 12.4 g/dL — AB (ref 13.0–17.0)
LYMPHS PCT: 23 % (ref 12–46)
Lymphs Abs: 1.2 10*3/uL (ref 0.7–4.0)
MCH: 30 pg (ref 26.0–34.0)
MCHC: 33.8 g/dL (ref 30.0–36.0)
MCV: 88.9 fL (ref 78.0–100.0)
MONOS PCT: 13 % — AB (ref 3–12)
Monocytes Absolute: 0.7 10*3/uL (ref 0.1–1.0)
Neutro Abs: 2.9 10*3/uL (ref 1.7–7.7)
Neutrophils Relative %: 58 % (ref 43–77)
Platelets: 280 10*3/uL (ref 150–400)
RBC: 4.13 MIL/uL — ABNORMAL LOW (ref 4.22–5.81)
RDW: 14 % (ref 11.5–15.5)
WBC: 5.2 10*3/uL (ref 4.0–10.5)

## 2013-09-06 LAB — URINALYSIS, ROUTINE W REFLEX MICROSCOPIC
Bilirubin Urine: NEGATIVE
Glucose, UA: NEGATIVE mg/dL
HGB URINE DIPSTICK: NEGATIVE
Ketones, ur: NEGATIVE mg/dL
LEUKOCYTES UA: NEGATIVE
NITRITE: NEGATIVE
PROTEIN: NEGATIVE mg/dL
Specific Gravity, Urine: 1.007 (ref 1.005–1.030)
UROBILINOGEN UA: 0.2 mg/dL (ref 0.0–1.0)
pH: 8 (ref 5.0–8.0)

## 2013-09-06 LAB — COMPREHENSIVE METABOLIC PANEL
ALBUMIN: 3.1 g/dL — AB (ref 3.5–5.2)
ALK PHOS: 69 U/L (ref 39–117)
ALT: 15 U/L (ref 0–53)
AST: 18 U/L (ref 0–37)
BUN: 16 mg/dL (ref 6–23)
CALCIUM: 9.2 mg/dL (ref 8.4–10.5)
CO2: 25 mEq/L (ref 19–32)
Chloride: 96 mEq/L (ref 96–112)
Creatinine, Ser: 1.29 mg/dL (ref 0.50–1.35)
GFR calc non Af Amer: 55 mL/min — ABNORMAL LOW (ref >90)
GFR, EST AFRICAN AMERICAN: 64 mL/min — AB (ref >90)
GLUCOSE: 105 mg/dL — AB (ref 70–99)
POTASSIUM: 4.2 meq/L (ref 3.7–5.3)
SODIUM: 134 meq/L — AB (ref 137–147)
TOTAL PROTEIN: 7 g/dL (ref 6.0–8.3)
Total Bilirubin: 0.3 mg/dL (ref 0.3–1.2)

## 2013-09-06 LAB — TROPONIN I

## 2013-09-06 LAB — CORTISOL: CORTISOL PLASMA: 8.6 ug/dL

## 2013-09-06 MED ORDER — NITROGLYCERIN 0.4 MG SL SUBL
0.4000 mg | SUBLINGUAL_TABLET | SUBLINGUAL | Status: DC | PRN
  Administered 2013-09-06: 0.4 mg via SUBLINGUAL
  Filled 2013-09-06: qty 1

## 2013-09-06 MED ORDER — SODIUM CHLORIDE 0.9 % IV SOLN
INTRAVENOUS | Status: DC
  Administered 2013-09-06 – 2013-09-08 (×2): via INTRAVENOUS

## 2013-09-06 MED ORDER — LABETALOL HCL 5 MG/ML IV SOLN
10.0000 mg | INTRAVENOUS | Status: DC | PRN
  Administered 2013-09-06 (×2): 10 mg via INTRAVENOUS
  Filled 2013-09-06 (×2): qty 4

## 2013-09-06 NOTE — Progress Notes (Signed)
At 1445, patient's tech informed this nurse, BP lying elevated. Upon entering the room, patient standing up using urinal. Assisted back to bed. Sitting 200/100 left arm manually. Complaints of chest tightness and head "not feeling right." Dr. Tyrell Antonio and DON notified. Orders received. Hydralazine given IV. EKG and Troponin levels to be done. Will monitor status. Tonita Cong, RN

## 2013-09-06 NOTE — Consult Note (Signed)
Referring Physician: Regalado    Chief Complaint: Code stroke  HPI:                                                                                                                                         Curtis [name] is an 68 y.o. 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 In the ER patient was initially found to be hypotensive with systolic blood pressure in the 60s and was given 1 L normal saline bolus. Following which his blood pressure improved but on standing patient was orthostatic and has been admitted for further management. Today while in the room patient noted he did not feel right. He was complaining of left arm and leg tingling and weakness along with CP and vision changes on the left. At that time BP was 200/100 and 170/80. Code stroke was called. Pateint was brought to CT scanner which revealed no acute stroke. By the time he was brought back to room his symptoms resolved. Patient was brought to step down for closer BP monitoring.     Date last known well: Date: 09/06/2013 Time last known well: Time: 14:53 tPA Given: No: symptoms resolved  Past Medical History  Diagnosis Date  . Arthritis   . SOB (shortness of breath)   . Pulmonary asbestosis   . Hernia of unspecified site of abdominal cavity without mention of obstruction or gangrene     hiatal  . GERD (gastroesophageal reflux disease)   . Depression   . Diverticulitis     hospital [facility]  . Diverticulosis   . Reflex sympathetic dystrophy   . 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 Hypertension   . IBS (irritable bowel syndrome)     Past Surgical History  Procedure Laterality Date  . Nissen fundoplication    . Right elbow surgery      x 2  . Right knee arthroscopy    . Left shoulder       x 3  . Squamous cell skin cancer      Left Hand  . Cataract extraction       Family History  Problem Relation Age of Onset  . Colon cancer Mother   . Colon cancer Maternal Aunt   . Colon cancer Maternal Uncle    Social History:  reports that he quit smoking about 8 years ago. His smoking use included Cigarettes. He has a 125 pack-year smoking history. He quit smokeless tobacco use about 16 months ago. He reports that he does not drink alcohol or use illicit drugs.  Allergies:  Allergies  Allergen Reactions  . Codeine Itching    Medications:  Scheduled: . alprazolam  2 mg Oral BID  . aspirin EC  81 mg Oral Daily  . enoxaparin (LOVENOX) injection  40 mg Subcutaneous Q24H  . finasteride  5 mg Oral Daily  . gabapentin  300 mg Oral TID  . nortriptyline  50 mg Oral QHS  . pantoprazole  40 mg Oral Daily  . sodium chloride  3 mL Intravenous Q12H    ROS:                                                                                                                                       History obtained from the patient  General ROS: negative for - chills, fatigue, fever, night sweats, weight gain or weight loss Psychological ROS: negative for - behavioral disorder, hallucinations, memory difficulties, mood swings or suicidal ideation Ophthalmic ROS: negative for - blurry vision, double vision, eye pain or loss of vision ENT ROS: negative for - epistaxis, nasal discharge, oral lesions, sore throat, tinnitus or vertigo Allergy and Immunology ROS: negative for - hives or itchy/watery eyes Hematological and Lymphatic ROS: negative for - bleeding problems, bruising or swollen lymph nodes Endocrine ROS: negative for - galactorrhea, hair pattern changes, polydipsia/polyuria or temperature intolerance Respiratory ROS: negative for - cough, hemoptysis, shortness of breath or wheezing Cardiovascular ROS: negative for - chest pain, dyspnea on  exertion, edema or irregular heartbeat Gastrointestinal ROS: negative for - abdominal pain, diarrhea, hematemesis, nausea/vomiting or stool incontinence Genito-Urinary ROS: negative for - dysuria, hematuria, incontinence or urinary frequency/urgency Musculoskeletal ROS: negative for - joint swelling or muscular weakness Neurological ROS: as noted in HPI Dermatological ROS: negative for rash and skin lesion changes  Neurologic Examination:                                                                                                      Blood pressure 170/80, pulse 80, temperature 97.5 F (36.4 C), temperature source Oral, resp. rate 16, height 6' (1.829 m), weight 86.002 kg (189 lb 9.6 oz), SpO2 97.00%.  Mental Status: Alert, oriented, thought content appropriate.  Speech fluent without evidence of aphasia.  Able to follow 3 step commands without difficulty. Cranial Nerves: II: Discs flat bilaterally; Visual fields grossly normal, pupils equal, round, reactive to light and accommodation III,IV, VI: ptosis not present, extra-ocular motions intact bilaterally V,VII: smile symmetric, facial light touch sensation normal bilaterally VIII: hearing normal bilaterally IX,X: gag reflex present XI: bilateral shoulder shrug XII: midline tongue extension without atrophy or fasciculations  Motor: Right : Upper extremity   5/5    Left:     Upper extremity   5-/5  Lower extremity   5/5     Lower extremity   5/5 Tone and bulk:normal tone throughout; no atrophy noted Sensory: Pinprick and light touch intact throughout, bilaterally Deep Tendon Reflexes:  Right: Upper Extremity   Left: Upper extremity   biceps (C-5 to C-6) 2/4   biceps (C-5 to C-6) 2/4 tricep (C7) 2/4    triceps (C7) 2/4 Brachioradialis (C6) 2/4  Brachioradialis (C6) 2/4  Lower Extremity Lower Extremity  quadriceps (L-2 to L-4) 1/4   quadriceps (L-2 to L-4) 1/4 Achilles (S1) 0/4   Achilles (S1) 0/4  Plantars: Mute  bilaterally Cerebellar: normal finger-to-nose,  normal heel-to-shin test Gait: not tested CV: pulses palpable throughout    Lab Results: Basic Metabolic Panel:  Recent Labs Lab 09/05/13 1649 09/06/13 0649  NA 132* 134*  K 4.7 4.2  CL 91* 96  CO2 28 25  GLUCOSE 104* 105*  BUN 15 16  CREATININE 1.48* 1.29  CALCIUM 10.0 9.2    Liver Function Tests:  Recent Labs Lab 09/06/13 0649  AST 18  ALT 15  ALKPHOS 69  BILITOT 0.3  PROT 7.0  ALBUMIN 3.1*   No results found for this basename: LIPASE, AMYLASE,  in the last 168 hours No results found for this basename: AMMONIA,  in the last 168 hours  CBC:  Recent Labs Lab 09/05/13 1649 09/06/13 0649  WBC 7.5 5.2  NEUTROABS  --  2.9  HGB 13.9 12.4*  HCT 40.1 36.7*  MCV 88.3 88.9  PLT 361 280    Cardiac Enzymes:  Recent Labs Lab 09/05/13 2213  TROPONINI <0.30    Lipid Panel: No results found for this basename: CHOL, TRIG, HDL, CHOLHDL, VLDL, LDLCALC,  in the last 168 hours  CBG: No results found for this basename: GLUCAP,  in the last 168 hours  Microbiology: Results for orders placed during the hospital encounter of 10/31/12  URINE CULTURE     Status: None   Collection Time    11/01/12  2:34 AM      Result Value Ref Range Status   Specimen Description URINE, RANDOM   Final   Special Requests ADDED 0407   Final   Culture  Setup Time 11/01/2012 10:46   Final   Colony Count 8,000 COLONIES/ML   Final   Culture INSIGNIFICANT GROWTH   Final   Report Status 11/02/2012 FINAL   Final  CLOSTRIDIUM DIFFICILE BY PCR     Status: None   Collection Time    11/01/12  6:40 PM      Result Value Ref Range Status   C difficile by pcr NEGATIVE  NEGATIVE Final    Coagulation Studies: No results found for this basename: LABPROT, INR,  in the last 72 hours  Imaging: Dg Chest 2 View  09/05/2013   CLINICAL DATA:  Shortness of breath. Pulmonary asbestosis. Hypertension and dizziness.  EXAM: CHEST  2 VIEW  COMPARISON:   10/31/2012  FINDINGS: Two view exam shows persistent underlying chronic interstitial disease with a peripheral predominance. No overt pulmonary edema at this time. Stable asymmetric elevation of the left hemidiaphragm. The cardio pericardial silhouette is enlarged. Bones are diffusely demineralized.  IMPRESSION: Chronic interstitial disease with a peripheral predominance. There is more focal pleural-parenchymal opacity in the left lower lung, likely related to scarring given the stability. Evolving pneumonia would be difficult to exclude on this study  secondary to the prominent underlying chronic lung disease.   Electronically Signed   By: Misty Stanley M.D.   On: 09/05/2013 18:40       Assessment and plan discussed with with attending physician and they are in agreement.    Etta Quill PA-C [facility] [phone]  09/06/2013, 3:51 PM   Assessment: 68 y.o. male with transient left sided weakness and decreased sensation in the setting of hypertensive urgency.  Symptoms fully resolved.  Given BP cannot rule out small vessel CVA with resolved symptoms.   Stroke Risk Factors - hypertension  Plan: 1. HgbA1c, fasting lipid panel 2. MRI, MRA  of the brain without contrast 3. PT consult, OT consult, Speech consult 4. Echocardiogram 5. Carotid dopplers 6. Prophylactic therapy-Antiplatelet med: Aspirin - dose mg daily 7. Risk  Factor modification  I personally participate in this patient's evaluation and management including neurological examination, as well as formulating the above clinical impression and management recommendations.  Rush Farmer M.D.  [facility]  [phone]

## 2013-09-06 NOTE — Progress Notes (Signed)
Called by nurse patient SBP 200/100. Patient was complaining of chest tightness. Hydralazine ordered. PRN nitroglycerin. EKG, Cardiac enzymes. Subsequently patient complainig of blurry vision on left eye. Numbness and tingling left arm and leg. Code stroke Called.  Patient seen and examine. Motor strengthen 5/5,  cranial nerve intact.  Patient still with numbness now only on fingers and LE.  Neurology evaluating patient.  CT head ordered.  -will continue with hydralazine PRN. SBP decrease to 190.  -Cycle cardiac enzymes.  -Transfer to step down unit.  -will follow neuro recommendation.  Demuro, Md.

## 2013-09-06 NOTE — Progress Notes (Signed)
Nitro .4 mg 1 tablet given. Patient complaining of left arm and left leg tingling/weakness. Chest pain continues. BP decreased to 170/80 manually. Patient feels like vision is changing in left. Rapid response notified. Tonita Cong, RN

## 2013-09-06 NOTE — Progress Notes (Signed)
UR Completed.  Curtis [name] Curtis [name] [phone] 09/06/2013  

## 2013-09-06 NOTE — Significant Event (Signed)
Rapid Response Event Note  Overview: Time Called: 1516 Arrival Time: 1518 Event Type: Neurologic  Initial Focused Assessment: Dr Tyrell Antonio called code stroke at 1519 patient with left sided numbness, tingling and weakness in leg and arm. LKW at 1445. BP 170/80  HR 84  RR 16  O2 sat 97 on RA, placed on 2 L [place] because of symptoms. Patient anxious and teary upon my arrival. Patient lying flat in bed.  Dr Nicole Kindred at bedside to assess patient. NIHSS 1, decreased sensory of left arm. Denies CP at this time.   Interventions: Transported to radiology for head Ct then transferred to 3S04 via bed with o2 and heart monitor. RN at bedside upon transfer, updated on patient status.  Event Summary: Name of Physician Notified: Dr Tyrell Antonio at bedside at    Name of Consulting Physician Notified: Nicole Kindred at [place]  Outcome: Transferred (Comment) [phone])  Event End Time: [place]

## 2013-09-06 NOTE — Progress Notes (Signed)
Chaplain responded to referral from nurse. Pt was very tearful and stated that he was "tired of fighting" and was "ready to go home to God." He expressed concern about how his death would affect his sons; however, he is torn because he feels so tired.   Chaplain left pt's room briefly to confer with nurse and pt expressed worry that he would die before chaplain returned. RN stated that his medical problems are not life-threatening, but pt repeatedly stated he did not have long to live. Chaplain inquired about which medical treatment was so hard to endure and was making him "tired" but he could not pinpoint one; he just stated he was "tired of fighting."   Pt also stated he cannot go home because he believes his furnace and air conditioning unit will release toxic fumes that will kill him in his sleep. He also expressed fear of dying before he's confessed all of his sins and not going to heaven.   Chaplain provided prayer, emotional support, and compassionate presence. Will follow up as necessary.

## 2013-09-06 NOTE — Progress Notes (Signed)
[facility] PROGRESS NOTE  Curtis [name] DGU:[id number] DOB: 1945/05/25 DOA: 09/05/2013 PCP: [name], MD  Assessment/Plan: 1-Orthostatic hypotension - Hold HCTZ and lisinopril. Would hold this medications on discharge. Patient was recently started on this medication by PCP.  -Continue with IV fluids.  -Cortisol levels and plasma metanephrine to check for Addisons disease and pheochromocytoma pending.   2-Acute on chronic renal failure - probably from dehydration and being on HCTZ and Losartan which are on hold. UA pending. Cr peak to  1.4. Improved with IV fluids.   3-History of Hypertension; resume metoprolol when orthostatic hypotension resolved.   Depression - patient's Remeron was discontinued last week due to sleep disturbance. Continue other medications for now.   ReFlex sympathetic dystrophy.   Code Status: Full Code.  Family Communication: Care discussed with patient.  Disposition Plan: remain inpatient.    Consultants:  none  Procedures:  none  Antibiotics:  none  HPI/Subjective: Feeling better. Was lightheaded early this morning.   Objective: Filed Vitals:   09/06/13 0346  BP: 102/67  Pulse: 91  Temp:   Resp: 20    Intake/Output Summary (Last 24 hours) at 09/06/13 0904 Last data filed at 09/06/13 0850  Gross per 24 hour  Intake   1245 ml  Output    600 ml  Net    645 ml   Filed Weights   09/05/13 1647 09/05/13 2102 09/06/13 0340  Weight: 91.627 kg (202 lb) 86.002 kg (189 lb 9.6 oz) 86.002 kg (189 lb 9.6 oz)    Exam:   General:  No distress.  Cardiovascular: S 1, S 2 RRR  Respiratory: CTA  Abdomen: Bs present, soft, NT.   Musculoskeletal: Trace edema.   Data Reviewed: Basic Metabolic Panel:  Recent Labs Lab 09/05/13 1649 09/06/13 0649  NA 132* 134*  K 4.7 4.2  CL 91* 96  CO2 28 25  GLUCOSE 104* 105*  BUN 15 16  CREATININE 1.48* 1.29  CALCIUM 10.0 9.2   Liver Function Tests:  Recent Labs Lab  09/06/13 0649  AST 18  ALT 15  ALKPHOS 69  BILITOT 0.3  PROT 7.0  ALBUMIN 3.1*   No results found for this basename: LIPASE, AMYLASE,  in the last 168 hours No results found for this basename: AMMONIA,  in the last 168 hours CBC:  Recent Labs Lab 09/05/13 1649 09/06/13 0649  WBC 7.5 5.2  NEUTROABS  --  2.9  HGB 13.9 12.4*  HCT 40.1 36.7*  MCV 88.3 88.9  PLT 361 280   Cardiac Enzymes:  Recent Labs Lab 09/05/13 2213  TROPONINI <0.30   BNP (last 3 results) No results found for this basename: PROBNP,  in the last 8760 hours CBG: No results found for this basename: GLUCAP,  in the last 168 hours  No results found for this or any previous visit (from the past 240 hour(s)).   Studies: Dg Chest 2 View  09/05/2013   CLINICAL DATA:  Shortness of breath. Pulmonary asbestosis. Hypertension and dizziness.  EXAM: CHEST  2 VIEW  COMPARISON:  10/31/2012  FINDINGS: Two view exam shows persistent underlying chronic interstitial disease with a peripheral predominance. No overt pulmonary edema at this time. Stable asymmetric elevation of the left hemidiaphragm. The cardio pericardial silhouette is enlarged. Bones are diffusely demineralized.  IMPRESSION: Chronic interstitial disease with a peripheral predominance. There is more focal pleural-parenchymal opacity in the left lower lung, likely related to scarring given the stability. Evolving pneumonia would be difficult to exclude on  this study secondary to the prominent underlying chronic lung disease.   Electronically Signed   By: Misty Stanley M.D.   On: 09/05/2013 18:40    Scheduled Meds: . alprazolam  2 mg Oral BID  . aspirin EC  81 mg Oral Daily  . enoxaparin (LOVENOX) injection  40 mg Subcutaneous Q24H  . finasteride  5 mg Oral Daily  . gabapentin  300 mg Oral TID  . nortriptyline  50 mg Oral QHS  . pantoprazole  40 mg Oral Daily  . sodium chloride  3 mL Intravenous Q12H   Continuous Infusions: . sodium chloride 100 mL/hr at  09/06/13 0854    Principal Problem:   Postural hypotension Active Problems:   REFLEX SYMPATHETIC DYSTROPHY   Orthostatic hypotension   AKI (acute kidney injury)   HTN (hypertension)    Time spent: 30 minutes.     [facility] Pager [phone]. If 7PM-7AM, please contact night-coverage at [email or web address], password [email or web address] 09/06/2013, 9:04 AM  LOS: 1 day

## 2013-09-06 NOTE — Evaluation (Addendum)
Physical Therapy Evaluation Patient Details Name: Curtis [name] MRN: [id number] DOB: 01-Mar-1946 Today's Date: 09/06/2013   History of Present Illness  Pt admitted with orthostatic hypotension  Clinical Impression  Pt pleasant and moving well but slowly. Pt endorses multiple falls but refuses to use cane or RW at home for the majority of activity. Pt with decreased balance and increased fall risk who will benefit from acute therapy to maximize function and independence and decrease fall risk.     Follow Up Recommendations Home health PT    Equipment Recommendations  None recommended by PT    Recommendations for Other Services       Precautions / Restrictions Precautions Precautions: Fall      Mobility  Bed Mobility Overal bed mobility: Modified Independent                Transfers Overall transfer level: Modified independent                  Ambulation/Gait Ambulation/Gait assistance: Supervision Ambulation Distance (Feet): 250 Feet   Gait Pattern/deviations: Step-through pattern;Decreased stride length Gait velocity: 70ft/19 sec=1.39ft/sec Gait velocity interpretation: <1.8 ft/sec, indicative of risk for recurrent falls General Gait Details: slow cautious gait with cues for direction  Stairs Stairs: Yes Stairs assistance: Modified independent (Device/Increase time) Stair Management: One rail Right;Step to pattern;Forwards Number of Stairs: 4 General stair comments: step to pattern leading with RLE  Wheelchair Mobility    Modified Rankin (Stroke Patients Only)       Balance Overall balance assessment: History of Falls;Needs assistance   Sitting balance-Leahy Scale: Good Sitting balance - Comments: 10 min on commode without difficulty and able to perform pericare     Standing balance-Leahy Scale: Good                               Pertinent Vitals/Pain No pain    Home Living Family/patient expects to be discharged to::  Private residence Living Arrangements: Alone Available Help at Discharge: Family;Available PRN/intermittently Type of Home: House Home Access: Stairs to enter Entrance Stairs-Rails: Right Entrance Stairs-Number of Steps: 3 Home Layout: One level Home Equipment: Cane - single point;Walker - 2 wheels;Cane - quad Additional Comments: . 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 Son lives next door and helps if needed     Hand Dominance   Dominant Hand: Right    Extremity/Trunk Assessment   Upper Extremity Assessment: Overall WFL for tasks assessed           Lower Extremity Assessment: Overall WFL for tasks assessed      Cervical / Trunk Assessment: Normal  Communication   Communication: No difficulties  Cognition Arousal/Alertness: Awake/alert Behavior During Therapy: WFL for tasks assessed/performed Overall Cognitive Status: History of cognitive impairments - at baseline                      General Comments General comments (skin integrity, edema, etc.): pt reports a dozen falls in the last year but increased balance and decreased falls without AD rather than with it    Exercises        Assessment/Plan    PT Assessment Patient needs continued PT  services  PT Diagnosis Difficulty walking   PT Problem List Decreased activity tolerance;Decreased balance  PT Treatment Interventions Gait training;Functional mobility training;Balance training;DME instruction;Therapeutic activities;Therapeutic exercise;Patient/family education   PT Goals (Current goals can be found in the Care Plan section) Acute Rehab PT Goals Patient Stated Goal: return home PT Goal Formulation: With patient Time For Goal Achievement: 09/20/13 Potential to Achieve Goals: Good Additional  Goals Additional Goal #1: Pt will score >52 on Berg to decrease fall risk    Frequency Min 3X/week   Barriers to discharge Decreased caregiver support      Co-evaluation               End of Session Equipment Utilized During Treatment: Gait belt Activity Tolerance: Patient tolerated treatment well Patient left: in chair;with call bell/phone within reach;with chair alarm set Nurse Communication: Mobility status    Functional Assessment Tool Used: clinical judgement Functional Limitation: Mobility: Walking and moving around Mobility: Walking and Moving Around Current Status [phone]): At least 1 percent but less than 20 percent impaired, limited or restricted Mobility: Walking and Moving Around Goal Status [phone]): At least 1 percent but less than 20 percent impaired, limited or restricted    Time: 1008-1040 PT Time Calculation (min): 32 min   Charges:   PT Evaluation $Initial PT Evaluation Tier I: 1 Procedure PT Treatments $Therapeutic Activity: 8-22 mins   PT G Codes:   Functional Assessment Tool Used: clinical judgement Functional Limitation: Mobility: Walking and moving around    [place] 09/06/2013, 1:09 PM  Elwyn Reach, [place]

## 2013-09-07 ENCOUNTER — Inpatient Hospital Stay (HOSPITAL_COMMUNITY): Payer: Medicare Other

## 2013-09-07 DIAGNOSIS — G459 Transient cerebral ischemic attack, unspecified: Principal | ICD-10-CM

## 2013-09-07 DIAGNOSIS — E785 Hyperlipidemia, unspecified: Secondary | ICD-10-CM | POA: Diagnosis present

## 2013-09-07 DIAGNOSIS — H539 Unspecified visual disturbance: Secondary | ICD-10-CM

## 2013-09-07 DIAGNOSIS — J449 Chronic obstructive pulmonary disease, unspecified: Secondary | ICD-10-CM

## 2013-09-07 DIAGNOSIS — I059 Rheumatic mitral valve disease, unspecified: Secondary | ICD-10-CM

## 2013-09-07 DIAGNOSIS — R209 Unspecified disturbances of skin sensation: Secondary | ICD-10-CM

## 2013-09-07 LAB — LIPID PANEL
Cholesterol: 249 mg/dL — ABNORMAL HIGH (ref 0–200)
HDL: 24 mg/dL — AB (ref >39)
LDL CALC: UNDETERMINED mg/dL (ref 0–99)
Total CHOL/HDL Ratio: 10.4 RATIO
Triglycerides: 503 mg/dL — ABNORMAL HIGH (ref <150)
VLDL: UNDETERMINED mg/dL (ref 0–40)

## 2013-09-07 LAB — HEMOGLOBIN A1C
Hgb A1c MFr Bld: 6.2 % — ABNORMAL HIGH (ref <5.7)
Mean Plasma Glucose: 131 mg/dL — ABNORMAL HIGH (ref <117)

## 2013-09-07 LAB — BASIC METABOLIC PANEL
BUN: 12 mg/dL (ref 6–23)
CHLORIDE: 101 meq/L (ref 96–112)
CO2: 25 mEq/L (ref 19–32)
CREATININE: 1.11 mg/dL (ref 0.50–1.35)
Calcium: 9.1 mg/dL (ref 8.4–10.5)
GFR calc Af Amer: 76 mL/min — ABNORMAL LOW (ref >90)
GFR, EST NON AFRICAN AMERICAN: 66 mL/min — AB (ref >90)
Glucose, Bld: 105 mg/dL — ABNORMAL HIGH (ref 70–99)
Potassium: 3.8 mEq/L (ref 3.7–5.3)
Sodium: 138 mEq/L (ref 137–147)

## 2013-09-07 LAB — TROPONIN I

## 2013-09-07 MED ORDER — SIMVASTATIN 20 MG PO TABS
20.0000 mg | ORAL_TABLET | Freq: Every day | ORAL | Status: DC
  Administered 2013-09-07: 20 mg via ORAL
  Filled 2013-09-07 (×2): qty 1

## 2013-09-07 MED ORDER — METOPROLOL TARTRATE 50 MG PO TABS
50.0000 mg | ORAL_TABLET | Freq: Two times a day (BID) | ORAL | Status: DC
  Administered 2013-09-07 – 2013-09-08 (×3): 50 mg via ORAL
  Filled 2013-09-07 (×4): qty 1

## 2013-09-07 MED ORDER — ASPIRIN 325 MG PO TABS
325.0000 mg | ORAL_TABLET | Freq: Every day | ORAL | Status: DC
  Administered 2013-09-07 – 2013-09-08 (×2): 325 mg via ORAL
  Filled 2013-09-07 (×2): qty 1

## 2013-09-07 NOTE — Progress Notes (Signed)
  Echocardiogram 2D Echocardiogram has been performed.  [place]Donald Chapel 09/07/2013, 5:38 PM

## 2013-09-07 NOTE — Progress Notes (Addendum)
Stroke Team Progress Note  HISTORY Curtis [name] is an 68 y.o. 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 In the ER patient was initially found to be hypotensive with systolic blood pressure in the 60s and was given 1 L normal saline bolus. Following which his blood pressure improved but on standing patient was orthostatic and had been admitted for further management. Today 09/06/2013 while in his room patient noted he did not feel right. He was complaining of left arm and leg tingling and weakness along with CP and vision changes on the left. At that time BP was 200/100 and 170/80. Code stroke was called. Pateint was brought to CT scanner which revealed no acute stroke. By the time he was brought back to room his symptoms resolved. Patient was brought to step down for closer BP monitoring.  last known well 09/06/2013 at 14:53. Patient was not administered TPA secondary to resolved symptoms.  SUBJECTIVE No family  is at the bedside.  Overall he feels his condition is completely resolved.   OBJECTIVE Most recent Vital Signs: Filed Vitals:   09/07/13 0300 09/07/13 0400 09/07/13 0700 09/07/13 0800  BP: 128/64 138/65 155/75 151/81  Pulse: 85 90 79 78  Temp:  98.5 F (36.9 C) 98.1 F (36.7 C)   TempSrc:  Oral Oral   Resp: 17 15 17 14   Height:      Weight:  85.7 kg (188 lb 15 oz)    SpO2: 99% 100% 100% 100%   CBG (last 3)  No results found for this basename: GLUCAP,  in the last 72 hours  IV Fluid Intake:   . sodium chloride 100 mL/hr at 09/06/13 1619    MEDICATIONS  . alprazolam  2 mg Oral BID  . aspirin  325 mg Oral Daily  . enoxaparin (LOVENOX) injection  40 mg Subcutaneous Q24H  . finasteride  5 mg Oral Daily  . gabapentin  300 mg Oral TID  . metoprolol  50 mg Oral BID  . nortriptyline  50 mg Oral QHS  . pantoprazole  40 mg  Oral Daily  . sodium chloride  3 mL Intravenous Q12H   PRN:  acetaminophen, acetaminophen, hydrALAZINE, labetalol, nitroGLYCERIN, ondansetron (ZOFRAN) IV, ondansetron  Diet:  Cardiac thin liquids Activity:   DVT Prophylaxis:  Lovenox 40 mg sq daily   CLINICALLY SIGNIFICANT STUDIES Basic Metabolic Panel:   Recent Labs Lab 09/06/13 0649 09/07/13 0331  NA 134* 138  K 4.2 3.8  CL 96 101  CO2 25 25  GLUCOSE 105* 105*  BUN 16 12  CREATININE 1.29 1.11  CALCIUM 9.2 9.1   Liver Function Tests:   Recent Labs Lab 09/06/13 0649  AST 18  ALT 15  ALKPHOS 69  BILITOT 0.3  PROT 7.0  ALBUMIN 3.1*   CBC:   Recent Labs Lab 09/05/13 1649 09/06/13 0649  WBC 7.5 5.2  NEUTROABS  --  2.9  HGB 13.9 12.4*  HCT 40.1 36.7*  MCV 88.3 88.9  PLT 361 280   Coagulation: No results found for this basename: LABPROT, INR,  in the last 168 hours Cardiac Enzymes:   Recent Labs Lab 09/05/13 2213 09/06/13 2048 09/07/13 0331  TROPONINI <0.30 <0.30 <0.30   Urinalysis:   Recent Labs Lab 09/06/13 1840  COLORURINE YELLOW  LABSPEC 1.007  PHURINE 8.0  GLUCOSEU NEGATIVE  HGBUR NEGATIVE  BILIRUBINUR NEGATIVE  KETONESUR NEGATIVE  PROTEINUR NEGATIVE  UROBILINOGEN 0.2  NITRITE NEGATIVE  LEUKOCYTESUR NEGATIVE   Lipid Panel     Component Value Date/Time   CHOL 249* 09/07/2013 0800   TRIG 503* 09/07/2013 0800   HDL 24* 09/07/2013 0800   CHOLHDL 10.4 09/07/2013 0800   VLDL UNABLE TO CALCULATE IF TRIGLYCERIDE OVER 400 mg/dL 09/07/2013 0800   LDLCALC UNABLE TO CALCULATE IF TRIGLYCERIDE OVER 400 mg/dL 09/07/2013 0800    HgbA1C  No results found for this basename: HGBA1C    Urine Drug Screen:   No results found for this basename: labopia,  cocainscrnur,  labbenz,  amphetmu,  thcu,  labbarb    Alcohol Level: No results found for this basename: ETH,  in the last 168 hours  CT of the brain  09/06/2013    No acute intracranial abnormality. Mild atrophy and small vessel disease are stable from 2014.    MRI of the brain  09/07/2013    No acute infarction. Moderate chronic small-vessel changes affecting the cerebral hemispheric white matter.  MRA of the brain  09/07/2013     Intracranial atherosclerotic change of the medium size vessels. Focal stenosis of the anterior temporal branch of the MCA on the right. 30% stenosis of the left M1 segment. Stenotic disease affecting the PCA branches more distally. 50% stenosis at the distal right vertebral artery.  Wide mouth 2 x 3 mm aneurysm projecting upward from the M1 segment on the right.  2 mm aneurysm projecting posteriorly from the anterior communicating artery  2D Echocardiogram    Carotid Doppler    CXR  09/05/2013    Chronic interstitial disease with a peripheral predominance. There is more focal pleural-parenchymal opacity in the left lower lung, likely related to scarring given the stability. Evolving pneumonia would be difficult to exclude on this study secondary to the prominent underlying chronic lung disease.   EKG  Sinus rhythm with 1st degree A-V block. For complete results please see formal report.   Therapy Recommendations HH PT  Physical Exam   Mental Status:  Alert, oriented, thought content appropriate. Speech fluent without evidence of aphasia. Able to follow 3 step commands without difficulty.  Cranial Nerves:  II:  Visual fields grossly normal, pupils equal, round, reactive to light  III,IV, VI: ptosis not present, extra-ocular motions intact bilaterally  V,VII: smile symmetric, facial light touch sensation normal bilaterally   Motor:  Right : Upper extremity 5/5 Left: Upper extremity 5-/5  Lower extremity 5/5 Lower extremity 5/5  Tone and bulk:normal tone throughout; no atrophy noted  Sensory:light touch intact throughout, bilaterally  Plantars:  Mute bilaterally  Cerebellar:  normal finger-to-nose, normal heel-to-shin test  Gait: not tested     ASSESSMENT [name] is a 68 y.o. male who developed left  sided numbness, tingling and weakness in leg and arm in hospital. Imaging confirms no acute infarct. Dx:  No stroke or TIA related diagnosis suspected. Symptoms  felt to be  secondary to marked BP fluctuations.  On aspirin 81 mg orally every day prior to admission. Now on aspirin 325 mg orally every day for secondary stroke prevention. Patient with no resultant neurologic deficits this am. Stroke work up underway.   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  Follow up carotid doppler, 2D echo  [facility] PT recommended at d/c  OP monitoring of aneurysms, repeat imaging in 6 months   Burnetta Sabin, MSN, RN, ANVP-BC, AGPCNP-BC [facility] Pager: [phone] 09/07/2013 10:44 AM  I have personally obtained a history, examined the patient, evaluated imaging results, and formulated the assessment and plan of care. I agree with the above.  Jim Like, DO Neurology-Stroke [facility] Pager: [phone]   To contact Stroke Continuity provider, please refer to [email or web address] After hours, contact General Neurology

## 2013-09-07 NOTE — Progress Notes (Signed)
Pt tx per MD order, pt verbalized understanding of tx, family at [facility], pt VSS, report called to receiving RN, all questions answered,updates given at [facility]

## 2013-09-07 NOTE — Progress Notes (Signed)
*  PRELIMINARY RESULTS* Vascular Ultrasound Carotid Duplex (Doppler) has been completed.  Preliminary findings: Right ICA:  40-59% internal carotid artery stenosis.  Left ICA:  1-39% internal carotid artery stenosis. Bilateral:  Vertebral artery flow is antegrade.       Kings Grant, RCS 09/07/2013, 7:04 PM

## 2013-09-07 NOTE — Care Management Note (Signed)
    Page 1 of 1   09/08/2013     10:34:25 AM CARE MANAGEMENT NOTE 09/08/2013  Patient:  Curtis [name], Curtis [name]   Account Number:  [id number]  Date Initiated:  09/07/2013  Documentation initiated by:  Marvetta Gibbons  Subjective/Objective Assessment:   Pt admitted with TIA / stroke workup     Action/Plan:   PTA pt lived at home alone, PT/OT evals ordered   Anticipated DC Date:  09/09/2013   Anticipated DC Plan:  [place]  CM consult  Patient refused services      [facility] Choice  HOME HEALTH   Choice offered to / List presented to:  C-1 Patient        [place] arranged  HH-1 RN  HH-10 DISEASE MANAGEMENT  HH-2 PT  HH-3 OT      Status of service:  Completed, signed off Medicare Important Message given?  YES (If response is "NO", the following Medicare IM given date fields will be blank) Date Medicare IM given:  09/05/2013 Date Additional Medicare IM given:    Discharge Disposition:  HOME/SELF CARE  Per UR Regulation:  Reviewed for med. necessity/level of care/duration of stay  If discussed at [place] of Stay Meetings, dates discussed:    Comments:  09-08-13 [place], RN, BSN [phone] CM did offer choice for [facility] services and pt is refusing Services at this time. CM did call son in ref to disposition needs and the son agreed with pt no services. Son states he will assist with exercises/ ambulation and medication assistance. No further needs from CM at this time.

## 2013-09-07 NOTE — Progress Notes (Signed)
Patient ID: Curtis [name]  male  KZS:[id number]    DOB: Jun 11, 1945    DOA: 09/05/2013  PCP: [name], MD  Assessment/Plan: Principal Problem:   TIA (transient ischemic attack) - Symptoms of left-sided weakness and numbness resolved  - Neurology was consulted, recommended stroke workup as cannot rule out small vessel CVA - MRI, MRA brain, 2-D echo, carotid Dopplers ordered - Placed on aspirin 325 mg daily - Lipid panel ordered, cholesterol 249, triglycerides 503, placed on Zocor 20 mg daily - Hemoglobin A1c pending, - PTOT evaluation - Restarted metoprolol  Active Problems:   Orthostatic hypotension - Continue beta blocker, if patient is still orthostatic, will change to losartan only - Recheck orthostatic vitals  Hyperlipidemia; - Placed on Zocor    AKI (acute kidney injury) - Resolved with IV fluid hydration    HTN (hypertension) - Restarted metoprolol, hold off on HCTZ and losartan for now  DVT Prophylaxis:  Code Status: Full code  Family Communication:  Disposition: Transfer from step-down to telemetry floor  Consultants:  Neurology  Procedures:  None  Antibiotics:  None    Subjective: Patient seen and examined, states that left-sided numbness has resolved, denies any chest pain or shortness of breath, nausea vomiting  Objective: Weight change: -5.927 kg (-13 lb 1.1 oz)  Intake/Output Summary (Last 24 hours) at 09/07/13 1054 Last data filed at 09/07/13 0700  Gross per 24 hour  Intake   1780 ml  Output   2800 ml  Net  -1020 ml   Blood pressure 151/81, pulse 78, temperature 98.1 F (36.7 C), temperature source Oral, resp. rate 14, height 6' (1.829 m), weight 85.7 kg (188 lb 15 oz), SpO2 100.00%.  Physical Exam: General: Alert and awake, oriented x3, not in any acute distress. CVS: S1-S2 clear, no murmur rubs or gallops Chest: clear to auscultation bilaterally, no wheezing, rales or rhonchi Abdomen: soft nontender, nondistended,  normal bowel sounds  Extremities: no cyanosis, clubbing or edema noted bilaterally Neuro: Cranial nerves II-XII intact, no focal neurological deficits  Lab Results: Basic Metabolic Panel:  Recent Labs Lab 09/06/13 0649 09/07/13 0331  NA 134* 138  K 4.2 3.8  CL 96 101  CO2 25 25  GLUCOSE 105* 105*  BUN 16 12  CREATININE 1.29 1.11  CALCIUM 9.2 9.1   Liver Function Tests:  Recent Labs Lab 09/06/13 0649  AST 18  ALT 15  ALKPHOS 69  BILITOT 0.3  PROT 7.0  ALBUMIN 3.1*   No results found for this basename: LIPASE, AMYLASE,  in the last 168 hours No results found for this basename: AMMONIA,  in the last 168 hours CBC:  Recent Labs Lab 09/05/13 1649 09/06/13 0649  WBC 7.5 5.2  NEUTROABS  --  2.9  HGB 13.9 12.4*  HCT 40.1 36.7*  MCV 88.3 88.9  PLT 361 280   Cardiac Enzymes:  Recent Labs Lab 09/05/13 2213 09/06/13 2048 09/07/13 0331  TROPONINI <0.30 <0.30 <0.30   BNP: No components found with this basename: POCBNP,  CBG: No results found for this basename: GLUCAP,  in the last 168 hours   Micro Results: No results found for this or any previous visit (from the past 240 hour(s)).  Studies/Results: Dg Chest 2 View  09/05/2013   CLINICAL DATA:  Shortness of breath. Pulmonary asbestosis. Hypertension and dizziness.  EXAM: CHEST  2 VIEW  COMPARISON:  10/31/2012  FINDINGS: Two view exam shows persistent underlying chronic interstitial disease with a peripheral predominance. No overt pulmonary edema  at this time. Stable asymmetric elevation of the left hemidiaphragm. The cardio pericardial silhouette is enlarged. Bones are diffusely demineralized.  IMPRESSION: Chronic interstitial disease with a peripheral predominance. There is more focal pleural-parenchymal opacity in the left lower lung, likely related to scarring given the stability. Evolving pneumonia would be difficult to exclude on this study secondary to the prominent underlying chronic lung disease.    Electronically Signed   By: Misty Stanley M.D.   On: 09/05/2013 18:40   Ct Head Wo Contrast  09/06/2013   CLINICAL DATA:  Code stroke. Blurred vision left eye. Numbness and tingling left arm and leg.  EXAM: CT HEAD WITHOUT CONTRAST  TECHNIQUE: Contiguous axial images were obtained from the base of the skull through the vertex without contrast.  COMPARISON:  10/31/2012  FINDINGS: No evidence for acute infarction, hemorrhage, mass lesion, hydrocephalus, or extra-axial fluid. Mild atrophy and small vessel disease. Vascular calcification. Calvarium intact. No sinus or mastoid disease.  IMPRESSION: No acute intracranial abnormality. Mild atrophy and small vessel disease are stable from 2014.  Critical Value/emergent results were called by telephone at the time of interpretation on 09/06/2013 at 3:54 PM to Dr. Wallie Char, who verbally acknowledged these results.   Electronically Signed   By: Rolla Flatten M.D.   On: 09/06/2013 15:57   Mr [name] Head Wo Contrast  09/07/2013   CLINICAL DATA:  Stroke. Blurred vision left thigh. Numbness and tingling of the left arm and leg.  EXAM: MRI HEAD WITHOUT CONTRAST  MRA HEAD WITHOUT CONTRAST  TECHNIQUE: Multiplanar, multiecho pulse sequences of the brain and surrounding structures were obtained without intravenous contrast. Angiographic images of the head were obtained using MRA technique without contrast.  COMPARISON:  Head CT 09/06/2013  FINDINGS: MRI HEAD FINDINGS  Diffusion imaging does not show any acute or subacute infarction. The brainstem and cerebellum are normal. The cerebral hemispheres show mild to moderate chronic small vessel change of the deep and subcortical white matter. No cortical or large vessel territory infarction. No mass lesion, hemorrhage, hydrocephalus or extra-axial collection. No pituitary mass. No inflammatory sinus disease. No skull or skullbase lesion.  MRA HEAD FINDINGS  Both internal carotid arteries are widely patent into the brain. There is mild  narrowing in the right siphon. The right internal carotid artery supplies only the right middle cerebral artery territory. A1 is aplastic or occluded on the right. There is a small aneurysm projecting upward from the M1 segment which measures approximately 2 x 3 mm. There is stenosis of the anterior temporal branch of the middle cerebral artery.  The left internal carotid artery supplies the left middle cerebral artery and both anterior cerebral arteries. I think there is a 2 mm aneurysm projecting posteriorly from the anterior communicating region. There is a focal stenosis of the M1 segment estimated at 30%.  Both vertebral arteries are patent with the left being dominant. There is 50% stenosis at the junction of the right vertebral artery in the basilar. There is no basilar stenosis. Large anterior inferior cerebellar arteries are patent bilaterally. Superior cerebellar and posterior cerebral arteries are patent bilaterally. Distal vessels do show some atherosclerotic irregularity.  IMPRESSION: No acute infarction. Moderate chronic small-vessel changes affecting the cerebral hemispheric white matter.  Intracranial atherosclerotic change of the medium size vessels. Focal stenosis of the anterior temporal branch of the MCA on the right. 30% stenosis of the left M1 segment. Stenotic disease affecting the PCA branches more distally. 50% stenosis at the distal right vertebral artery.  Wide mouth 2 x 3 mm aneurysm projecting upward from the M1 segment on the right.  2 mm aneurysm projecting posteriorly from the anterior communicating artery .   Electronically Signed   By: Nelson Chimes M.D.   On: 09/07/2013 09:44   Mr Brain Wo Contrast  09/07/2013   CLINICAL DATA:  Stroke. Blurred vision left thigh. Numbness and tingling of the left arm and leg.  EXAM: MRI HEAD WITHOUT CONTRAST  MRA HEAD WITHOUT CONTRAST  TECHNIQUE: Multiplanar, multiecho pulse sequences of the brain and surrounding structures were obtained without  intravenous contrast. Angiographic images of the head were obtained using MRA technique without contrast.  COMPARISON:  Head CT 09/06/2013  FINDINGS: MRI HEAD FINDINGS  Diffusion imaging does not show any acute or subacute infarction. The brainstem and cerebellum are normal. The cerebral hemispheres show mild to moderate chronic small vessel change of the deep and subcortical white matter. No cortical or large vessel territory infarction. No mass lesion, hemorrhage, hydrocephalus or extra-axial collection. No pituitary mass. No inflammatory sinus disease. No skull or skullbase lesion.  MRA HEAD FINDINGS  Both internal carotid arteries are widely patent into the brain. There is mild narrowing in the right siphon. The right internal carotid artery supplies only the right middle cerebral artery territory. A1 is aplastic or occluded on the right. There is a small aneurysm projecting upward from the M1 segment which measures approximately 2 x 3 mm. There is stenosis of the anterior temporal branch of the middle cerebral artery.  The left internal carotid artery supplies the left middle cerebral artery and both anterior cerebral arteries. I think there is a 2 mm aneurysm projecting posteriorly from the anterior communicating region. There is a focal stenosis of the M1 segment estimated at 30%.  Both vertebral arteries are patent with the left being dominant. There is 50% stenosis at the junction of the right vertebral artery in the basilar. There is no basilar stenosis. Large anterior inferior cerebellar arteries are patent bilaterally. Superior cerebellar and posterior cerebral arteries are patent bilaterally. Distal vessels do show some atherosclerotic irregularity.  IMPRESSION: No acute infarction. Moderate chronic small-vessel changes affecting the cerebral hemispheric white matter.  Intracranial atherosclerotic change of the medium size vessels. Focal stenosis of the anterior temporal branch of the MCA on the right.  30% stenosis of the left M1 segment. Stenotic disease affecting the PCA branches more distally. 50% stenosis at the distal right vertebral artery.  Wide mouth 2 x 3 mm aneurysm projecting upward from the M1 segment on the right.  2 mm aneurysm projecting posteriorly from the anterior communicating artery .   Electronically Signed   By: Nelson Chimes M.D.   On: 09/07/2013 09:44    Medications: Scheduled Meds: . alprazolam  2 mg Oral BID  . aspirin  325 mg Oral Daily  . enoxaparin (LOVENOX) injection  40 mg Subcutaneous Q24H  . finasteride  5 mg Oral Daily  . gabapentin  300 mg Oral TID  . metoprolol  50 mg Oral BID  . nortriptyline  50 mg Oral QHS  . pantoprazole  40 mg Oral Daily  . simvastatin  20 mg Oral q1800  . sodium chloride  3 mL Intravenous Q12H      LOS: 2 days   Othel Dicostanzo Krystal Eaton M.D. [facility] 09/07/2013, 10:54 AM Pager: [phone]  If 7PM-7AM, please contact night-coverage [email or web address] Password [email or web address]  **Disclaimer: This note was dictated with voice recognition software. Similar sounding words can inadvertently be transcribed  and this note may contain transcription errors which may not have been corrected upon publication of note.**

## 2013-09-08 DIAGNOSIS — E785 Hyperlipidemia, unspecified: Secondary | ICD-10-CM

## 2013-09-08 DIAGNOSIS — I671 Cerebral aneurysm, nonruptured: Secondary | ICD-10-CM

## 2013-09-08 MED ORDER — SIMVASTATIN 20 MG PO TABS: 20.0000 mg | ORAL_TABLET | Freq: Every day | ORAL | Status: DC

## 2013-09-08 MED ORDER — TAMSULOSIN HCL 0.4 MG PO CAPS
0.4000 mg | ORAL_CAPSULE | Freq: Every day | ORAL | Status: DC
  Administered 2013-09-08: 0.4 mg via ORAL
  Filled 2013-09-08: qty 1

## 2013-09-08 MED ORDER — LOSARTAN POTASSIUM 100 MG PO TABS: 100.0000 mg | ORAL_TABLET | Freq: Every day | ORAL | Status: DC

## 2013-09-08 MED ORDER — LOSARTAN POTASSIUM 50 MG PO TABS
50.0000 mg | ORAL_TABLET | Freq: Every day | ORAL | Status: DC
  Administered 2013-09-08: 50 mg via ORAL
  Filled 2013-09-08: qty 1

## 2013-09-08 MED ORDER — TAMSULOSIN HCL 0.4 MG PO CAPS: 0.4000 mg | ORAL_CAPSULE | Freq: Every day | ORAL | Status: DC

## 2013-09-08 MED ORDER — ASPIRIN 325 MG PO TABS: 325.0000 mg | ORAL_TABLET | Freq: Every day | ORAL | Status: DC

## 2013-09-08 NOTE — Evaluation (Signed)
Occupational Therapy Evaluation  Patient Details Name: Curtis [name] MRN: [id number] DOB: 1945/06/18 Today's Date: 09/07/2013     History of Present Illness Pt admitted with orthostatic hypotension   Clinical Impression   Patient evaluated by Occupational Therapy with no further acute OT needs identified. All education has been completed and the patient has no further questions. See below for any follow-up Occupational Therapy or equipment needs. OT to sign off. Thank you for referral.      Follow Up Recommendations  Home health OT;Supervision - Intermittent    Equipment Recommendations  None recommended by OT    Recommendations for Other Services       Precautions / Restrictions Precautions Precautions: Fall      Mobility Bed Mobility Overal bed mobility: Modified Independent                Transfers Overall transfer level: Modified independent                    Balance Overall balance assessment: History of Falls   Sitting balance-Leahy Scale: Good Sitting balance - Comments: dressing EOB   Standing balance support: During functional activity Standing balance-Leahy Scale: Good                              ADL Overall ADL's : Needs assistance/impaired     Grooming: Supervision/safety;Standing           Upper Body Dressing : Modified independent;Sitting   Lower Body Dressing: Supervision/safety;Sit to/from stand   Toilet Transfer: Supervision/safety   Toileting- Water quality scientist and Hygiene: Supervision/safety   Tub/ Banker: Walk-in shower;Min Chief Executive Officer Details (indicate cue type and reason): pt requires use of door frame in shower simulation and LOB posteriorly with first attempt Functional mobility during ADLs: Supervision/safety General ADL Comments: Pt demonstrates decr gait length and velocity. Pt self reports these deficits with awareness. Pt and sister present for session. pt  educated on use of cell phone in home as a safety item to call for help and programming phone to call for help with push of one button. Discussed establishing  a calling plan since pt lives alone so family can call to check on him daily. If pt does not answer within a set time frame family knows to come check his home to make sure he is okay. pt educated on fall risk wtih adls and using tempted water temperature for showering. Advised to shower with family present the first few times. Pt educated on the need for [facility] and highly recommend. Pt very apprehensive about agreement and OT questions if Pt will engage in follow up OT.     Vision                     Perception Perception Perception Tested?: No   Praxis Praxis Praxis tested?: Not tested    Pertinent Vitals/Pain VSS     Hand Dominance Right   Extremity/Trunk Assessment Upper Extremity Assessment Upper Extremity Assessment: Overall WFL for tasks assessed   Lower Extremity Assessment Lower Extremity Assessment: Defer to PT evaluation   Cervical / Trunk Assessment Cervical / Trunk Assessment: Normal   Communication Communication Communication: No difficulties   Cognition Arousal/Alertness: Awake/alert Behavior During Therapy: WFL for tasks assessed/performed Overall Cognitive Status: History of cognitive impairments - at baseline  General Comments       Exercises       Shoulder Instructions      Home Living Family/patient expects to be discharged to:: Private residence Living Arrangements: Alone Available Help at Discharge: Family;Available PRN/intermittently Type of Home: House Home Access: Stairs to enter [employer] of Steps: 3 Entrance Stairs-Rails: Right Home Layout: One level     Bathroom Shower/Tub: Walk-in shower;Door   Bathroom Toilet: Standard (with 3n1)     Home Equipment: Cane - single point;Walker - 2 wheels;Cane - quad   Additional Comments: .  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 Son lives next door and helps if needed    OT Diagnosis:     OT Problem List:     OT Treatment/Interventions:      OT Goals(Current goals can be found in the care plan section)    OT Frequency:     Barriers to D/C:            Co-evaluation              End of Session Equipment Utilized During Treatment: Gait belt Nurse Communication: Mobility status;Precautions  Activity Tolerance: Patient tolerated treatment well Patient left: in bed;with call bell/phone within reach;with family/visitor present   Time: 1440-1512 OT Time Calculation (min): 32 min Charges:  OT General Charges $OT Visit: 1 Procedure OT Evaluation $Initial OT Evaluation Tier I: 1 Procedure OT Treatments $Self Care/Home Management : 23-37 mins G-Codes:    Peri Maris 09/25/2013, 15:30PM Pager: [phone]

## 2013-09-08 NOTE — Discharge Instructions (Signed)
Hypertension Hypertension is another name for high blood pressure. High blood pressure may mean that your heart needs to work harder to pump blood. Blood pressure consists of two numbers, which includes a higher number over a lower number (example: 110/72). HOME CARE   Make lifestyle changes as told by your doctor. This may include weight loss and exercise.  Take your blood pressure medicine every day.  Limit how much salt you use.  Stop smoking if you smoke.  Do not use drugs.  Talk to your doctor if you are using decongestants or birth control pills. These medicines might make blood pressure higher.  Females should not drink more than 1 alcoholic drink per day. Males should not drink more than 2 alcoholic drinks per day.  See your doctor as told. GET HELP RIGHT AWAY IF:   You have a blood pressure reading with a top number of 180 or higher.  You get a very bad headache.  You get blurred or changing vision.  You feel confused.  You feel weak, numb, or faint.  You get chest or belly (abdominal) pain.  You throw up (vomit).  You cannot breathe very well. MAKE SURE YOU:   Understand these instructions.  Will watch your condition.  Will get help right away if you are not doing well or get worse. Document Released: 10/08/2007 Document Revised: 07/14/2011 Document Reviewed: 10/08/2007 Exit[employer] Patient Information 2014 [employer].  

## 2013-09-08 NOTE — Discharge Summary (Signed)
Physician Discharge Summary  Patient ID: Curtis [name] MRN: [id number] DOB/AGE: 1945-05-23 68 y.o.  Admit date: 09/05/2013 Discharge date: 09/08/2013  Primary Care Physician:  [name], MD  Discharge Diagnoses:    .TIA (transient ischemic attack) . Orthostatic hypotension . AKI (acute kidney injury) . HTN (hypertension)-uncontrolled with the orthostasis   . Other and unspecified hyperlipidemia with hypertriglyceridemia  . Aneurysm, cerebral, nonruptured   BPH, benign prostatic hypertrophy  Consults:  Neurology, Dr Janann Colonel   Recommendations for Outpatient Follow-up:  Please note, at the time of presentation patient had orthostasis with hypotension, HCTZ has been discontinued. His BP was low in 60s. Losartan was added back as patient now has elevated BP.  Hemoglobin A1c is 6.2, patient's CBGs however were controlled, please follow this closely and repeat hemoglobin A1c in 3 months.  Please follow lipid panel in 4-6 weeks  Allergies:   Allergies  Allergen Reactions  . Codeine Itching     Discharge Medications:   Medication List    STOP taking these medications       losartan-hydrochlorothiazide 100-12.5 MG per tablet  Commonly known as:  HYZAAR      TAKE these medications       alprazolam 2 MG tablet  Commonly known as:  XANAX  Take 2 mg by mouth 2 (two) times daily.     aspirin 325 MG tablet  Take 1 tablet (325 mg total) by mouth daily.     finasteride 5 MG tablet  Commonly known as:  PROSCAR  Take 5 mg by mouth every morning.     gabapentin 300 MG capsule  Commonly known as:  NEURONTIN  Take 300 mg by mouth 3 (three) times daily.     losartan 100 MG tablet  Commonly known as:  COZAAR  Take 1 tablet (100 mg total) by mouth daily.     metoprolol 50 MG tablet  Commonly known as:  LOPRESSOR  Take 50 mg by mouth 2 (two) times daily.     nortriptyline 50 MG capsule  Commonly known as:  PAMELOR  Take 50 mg by mouth at bedtime.     omeprazole  20 MG capsule  Commonly known as:  PRILOSEC  Take 1 capsule by mouth  daily     simvastatin 20 MG tablet  Commonly known as:  ZOCOR  Take 1 tablet (20 mg total) by mouth at bedtime.     tamsulosin 0.4 MG Caps capsule  Commonly known as:  FLOMAX  Take 1 capsule (0.4 mg total) by mouth daily.         Brief H and P: For complete details please refer to admission H and P, but in brief Curtis [name] is a 68 y.o. 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 Patient had gone to followup with his psychiatrist last week and was found to have high blood pressure. He was referred to his primary care and was started on losartan hydrochlorothiazide combination 100/12.5 mg daily. Patient started feeling lightheaded over the last 24 hours but did not lose consciousness. Denies any focal deficits incontinence of urine headache difficulty talking or swallowing. Had some blurry vision for a few seconds in his left eye which happened in the last year when he had orthostatic changes. In the ER patient was initially found to be hypotensive with systolic blood pressure in the 60s and  was given 1 L normal saline bolus. Following which his blood pressure improved but on standing patient was orthostatic and was admitted for further management. Patient denied any chest pain or shortness of breath. 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 In the ER patient was found to be hypotensive with systolic blood pressure in 60s and was placed on IV fluid hydration. On 09/06/13, patient  did not feel "right", complaining of left arm and left leg tingling, weakness along with chest pain and vision changes on the left. BP at this time was 200/100. Patient was transferred to step down unit. Neurology was consulted. Patient was not consider TPA candidate started to resolve the symptoms.  TIA (transient ischemic attack) - Symptoms of left-sided weakness and numbness resolved   Neurology was consulted, recommended full stroke workup as cannot rule out small vessel CVA. MRI of the brain showed no acute infarction with moderate chronic small vessel changes affecting the cerebral hemispheric white matter. 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   Placed on aspirin 325 mg daily,  Lipid panel showed cholesterol 249, triglycerides 503, placed on Zocor 20 mg daily  2-D echo showed EF of 55-60%, no regional wall motion abnormalities, grade 1 diastolic dysfunction Carotid Dopplers showed no critical ICA stenosis Patient was recommended home health physical therapy however he declined at this time.  Orthostatic hypotension : Patient was placed on IV fluid hydration at the time of admission. Patient was continued on beta blocker, losartan was added, HCTZ was discontinued.    Hyperlipidemia: - Placed on Zocor   AKI (acute kidney injury) -  creatinine at the time of admission was 1.48 improved to 1.1 with IV fluid hydration. Initially losartan, HCTZ were held. The started was subsequently restarted once patient's BP was elevated after possible TIA   HTN (hypertension) - Restarted metoprolol and losartan, hold off on HCTZ   Incidental finding of aneurysms: Please repeat CTA or MRA of the brain in 6 months  Day of Discharge BP 165/71  Pulse 74  Temp(Src) 98.1 F (36.7 C) (Oral)  Resp 18   Ht 6' (1.829 m)  Wt 87.136 kg (192 lb 1.6 oz)  BMI 26.05 kg/m2  SpO2 91%  Physical Exam: General: Alert and awake oriented x3 not in any acute distress. HEENT: anicteric sclera, pupils reactive to light and accommodation CVS: S1-S2 clear no murmur rubs or gallops Chest: clear to auscultation bilaterally, no wheezing rales or rhonchi Abdomen: soft nontender, nondistended, normal bowel sounds Extremities: no cyanosis, clubbing or edema noted bilaterally Neuro: Cranial nerves II-XII intact, no focal neurological deficits   The results of significant diagnostics from this hospitalization (including imaging, microbiology, ancillary and laboratory) are listed below for reference.    LAB RESULTS: Basic Metabolic Panel:  Recent Labs Lab 09/06/13 0649 09/07/13 0331  NA 134* 138  K 4.2 3.8  CL 96 101  CO2 25 25  GLUCOSE 105* 105*  BUN 16 12  CREATININE 1.29 1.11  CALCIUM 9.2 9.1   Liver Function Tests:  Recent Labs Lab  09/06/13 0649  AST 18  ALT 15  ALKPHOS 69  BILITOT 0.3  PROT 7.0  ALBUMIN 3.1*   No results found for this basename: LIPASE, AMYLASE,  in the last 168 hours No results found for this basename: AMMONIA,  in the last 168 hours CBC:  Recent Labs Lab 09/05/13 1649 09/06/13 0649  WBC 7.5 5.2  NEUTROABS  --  2.9  HGB 13.9 12.4*  HCT 40.1 36.7*  MCV 88.3 88.9  PLT 361 280   Cardiac Enzymes:  Recent Labs Lab 09/06/13 2048 09/07/13 0331  TROPONINI <0.30 <0.30   BNP: No components found with this basename: POCBNP,  CBG: No results found for this basename: GLUCAP,  in the last 168 hours  Significant Diagnostic Studies:  Dg Chest 2 View  09/05/2013   CLINICAL DATA:  Shortness of breath. Pulmonary asbestosis. Hypertension and dizziness.  EXAM: CHEST  2 VIEW  COMPARISON:  10/31/2012  FINDINGS: Two view exam shows persistent underlying chronic interstitial disease with a peripheral predominance. No overt pulmonary edema at this time. Stable asymmetric  elevation of the left hemidiaphragm. The cardio pericardial silhouette is enlarged. Bones are diffusely demineralized.  IMPRESSION: Chronic interstitial disease with a peripheral predominance. There is more focal pleural-parenchymal opacity in the left lower lung, likely related to scarring given the stability. Evolving pneumonia would be difficult to exclude on this study secondary to the prominent underlying chronic lung disease.   Electronically Signed   By: Misty Stanley M.D.   On: 09/05/2013 18:40   Ct Head Wo Contrast  09/06/2013   CLINICAL DATA:  Code stroke. Blurred vision left eye. Numbness and tingling left arm and leg.  EXAM: CT HEAD WITHOUT CONTRAST  TECHNIQUE: Contiguous axial images were obtained from the base of the skull through the vertex without contrast.  COMPARISON:  10/31/2012  FINDINGS: No evidence for acute infarction, hemorrhage, mass lesion, hydrocephalus, or extra-axial fluid. Mild atrophy and small vessel disease. Vascular calcification. Calvarium intact. No sinus or mastoid disease.  IMPRESSION: No acute intracranial abnormality. Mild atrophy and small vessel disease are stable from 2014.  Critical Value/emergent results were called by telephone at the time of interpretation on 09/06/2013 at 3:54 PM to Dr. Wallie Char, who verbally acknowledged these results.   Electronically Signed   By: Rolla Flatten M.D.   On: 09/06/2013 15:57    2D ECHO: Study Conclusions  - Left ventricle: The cavity size was normal. There was mild concentric hypertrophy. Systolic function was normal. The estimated ejection fraction was in the range of 55% to 60%. Wall motion was normal; there were no regional wall motion abnormalities. There was an increased relative contribution of atrial contraction to ventricular filling. Doppler parameters are consistent with abnormal left ventricular relaxation (grade 1 diastolic dysfunction). - Mitral valve: Mild regurgitation.    Disposition and  Follow-up:     Discharge Orders   Future Orders Complete By Expires   Diet Carb Modified  As directed    Discharge instructions  As directed    Increase activity slowly  As directed        DISPOSITION: Home  DIET: Carb modify diet    DISCHARGE FOLLOW-UP Follow-up Information   Follow up with Forbes Cellar, MD.   Specialties:  Neurology, Radiology   Contact information:   [address] [place] [address] [phone]       Follow up with [facility], MD. Schedule an appointment as soon as possible for a visit in 2 weeks. (  for hospital follow-up)    Specialty:  Psychiatry   Contact information:   [place] [address] [phone]       Follow up with [name], MD. Schedule an appointment as soon as possible for a visit in 2 weeks. (for hospital follow-up)    Specialty:  Family Medicine   Contact information:   [address] [place] [address] [phone]       Time spent on Discharge: 45 mins  Signed:   [name] M.D. [facility] 09/08/2013, 11:29 AM Pager: [phone]   **Disclaimer: This note was dictated with voice recognition software. Similar sounding words can inadvertently be transcribed and this note may contain transcription errors which may not have been corrected upon publication of note.**

## 2013-09-08 NOTE — Progress Notes (Signed)
Physical Therapy Treatment Patient Details Name: Curtis [name] MRN: [id number] DOB: [birth year] Today's Date: 09/08/2013    History of Present Illness Pt admitted with orthostatic hypotension    PT Comments    Pt moves well but slowly.  Cont's to refuse to use cane or RW as well as stating he does not want HHPT, that his family can help him.    Follow Up Recommendations  Home health PT (pt refusing at this time)     Equipment Recommendations  None recommended by PT    Recommendations for Other Services       Precautions / Restrictions Precautions Precautions: Fall Restrictions Weight Bearing Restrictions: No    Mobility  Bed Mobility Overal bed mobility: Modified Independent                Transfers Overall transfer level: Modified independent                  Ambulation/Gait Ambulation/Gait assistance: Supervision Ambulation Distance (Feet): 200 Feet Assistive device: None Gait Pattern/deviations: Step-through pattern;Decreased stride length     General Gait Details: slow cautious gait.  Able to perform head turns in all directions, directional changes, & sudden stops.     Stairs            Wheelchair Mobility    Modified Rankin (Stroke Patients Only)       Balance Overall balance assessment: History of Falls   Sitting balance-Leahy Scale: Good Sitting balance - Comments: dressing EOB   Standing balance support: No upper extremity supported Standing balance-Leahy Scale: Good Standing balance comment: Able to perform reaching across midline, various heights, pick objects up off floor,             High level balance activites: Direction changes;Turns;Sudden stops;Head turns      Cognition Arousal/Alertness: Awake/alert Behavior During Therapy: WFL for tasks assessed/performed Overall Cognitive Status: History of cognitive impairments - at baseline                      Exercises      General Comments         Pertinent Vitals/Pain No pain reported.     Home Living Family/patient expects to be discharged to:: Private residence Living Arrangements: Alone Available Help at Discharge: Family;Available PRN/intermittently Type of Home: House Home Access: Stairs to enter Entrance Stairs-Rails: Right Home Layout: One level Home Equipment: Cane - single point;Walker - 2 wheels;Cane - quad Additional Comments: . 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 Son lives next door and helps if needed   PT Goals (current goals can now be found in the care plan section) Acute Rehab PT Goals PT Goal Formulation: With patient Time For Goal Achievement: 09/20/13 Potential to Achieve Goals: Good Progress towards PT goals: Progressing toward goals    Frequency  Min 3X/week    PT Plan Current plan remains appropriate    Co-evaluation             End of Session Equipment Utilized During Treatment: Gait belt Activity Tolerance: Patient tolerated treatment well Patient left: Other (comment);with call bell/phone within reach (sitting EOB)     Time: 2229-7989 PT Time Calculation (min): 13 min  Charges:  $Gait Training: 8-22 mins  G Codes:      Sena Hitch 09-Sep-2013, 10:21 AM  Sarajane Marek, PTA [phone] 2013/09/09

## 2013-09-09 LAB — METANEPHRINES, PLASMA
Metanephrine, Free: <25 pg/mL (ref <=57)
NORMETANEPHRINE FREE: 68 pg/mL (ref <=148)
TOTAL METANEPHRINES-PLASMA: 68 pg/mL (ref <=205)

## 2013-11-18 ENCOUNTER — Ambulatory Visit: Payer: Self-pay | Admitting: Neurology

## 2013-12-14 ENCOUNTER — Other Ambulatory Visit: Payer: Self-pay | Admitting: *Deleted

## 2013-12-14 MED ORDER — OMEPRAZOLE 20 MG PO CPDR: DELAYED_RELEASE_CAPSULE | ORAL | Status: DC

## 2014-01-03 DIAGNOSIS — I219 Acute myocardial infarction, unspecified: Secondary | ICD-10-CM

## 2014-01-03 HISTORY — DX: Acute myocardial infarction, unspecified: I21.9

## 2014-01-16 ENCOUNTER — Emergency Department (HOSPITAL_COMMUNITY): Payer: Medicare Other

## 2014-01-16 ENCOUNTER — Inpatient Hospital Stay (HOSPITAL_COMMUNITY)
Admission: EM | Admit: 2014-01-16 | Discharge: 2014-01-20 | DRG: 246 | Disposition: A | Discharge status: Home Health | Payer: Medicare Other | Attending: Internal Medicine | Admitting: Internal Medicine

## 2014-01-16 ENCOUNTER — Encounter (HOSPITAL_COMMUNITY)
Admission: EM | Disposition: A | Discharge status: Home / Self Care | Payer: Self-pay | Source: Home / Self Care | Attending: Internal Medicine

## 2014-01-16 ENCOUNTER — Encounter (HOSPITAL_COMMUNITY): Payer: Self-pay | Admitting: Emergency Medicine

## 2014-01-16 DIAGNOSIS — I509 Heart failure, unspecified: Secondary | ICD-10-CM | POA: Diagnosis present

## 2014-01-16 DIAGNOSIS — N183 Chronic kidney disease, stage 3 unspecified: Secondary | ICD-10-CM | POA: Diagnosis present

## 2014-01-16 DIAGNOSIS — J9601 Acute respiratory failure with hypoxia: Secondary | ICD-10-CM | POA: Diagnosis present

## 2014-01-16 DIAGNOSIS — N179 Acute kidney failure, unspecified: Secondary | ICD-10-CM | POA: Diagnosis present

## 2014-01-16 DIAGNOSIS — E869 Volume depletion, unspecified: Secondary | ICD-10-CM | POA: Diagnosis present

## 2014-01-16 DIAGNOSIS — J61 Pneumoconiosis due to asbestos and other mineral fibers: Secondary | ICD-10-CM

## 2014-01-16 DIAGNOSIS — I471 Supraventricular tachycardia, unspecified: Secondary | ICD-10-CM

## 2014-01-16 DIAGNOSIS — I472 Ventricular tachycardia, unspecified: Secondary | ICD-10-CM | POA: Diagnosis not present

## 2014-01-16 DIAGNOSIS — Z955 Presence of coronary angioplasty implant and graft: Secondary | ICD-10-CM

## 2014-01-16 DIAGNOSIS — N138 Other obstructive and reflux uropathy: Secondary | ICD-10-CM | POA: Diagnosis present

## 2014-01-16 DIAGNOSIS — F411 Generalized anxiety disorder: Secondary | ICD-10-CM | POA: Diagnosis present

## 2014-01-16 DIAGNOSIS — Z87891 Personal history of nicotine dependence: Secondary | ICD-10-CM

## 2014-01-16 DIAGNOSIS — Z9849 Cataract extraction status, unspecified eye: Secondary | ICD-10-CM | POA: Diagnosis not present

## 2014-01-16 DIAGNOSIS — R339 Retention of urine, unspecified: Secondary | ICD-10-CM | POA: Diagnosis not present

## 2014-01-16 DIAGNOSIS — G9050 Complex regional pain syndrome I, unspecified: Secondary | ICD-10-CM

## 2014-01-16 DIAGNOSIS — N401 Enlarged prostate with lower urinary tract symptoms: Secondary | ICD-10-CM | POA: Diagnosis present

## 2014-01-16 DIAGNOSIS — J4489 Other specified chronic obstructive pulmonary disease: Secondary | ICD-10-CM

## 2014-01-16 DIAGNOSIS — Z7982 Long term (current) use of aspirin: Secondary | ICD-10-CM | POA: Diagnosis not present

## 2014-01-16 DIAGNOSIS — K5730 Diverticulosis of large intestine without perforation or abscess without bleeding: Secondary | ICD-10-CM | POA: Diagnosis present

## 2014-01-16 DIAGNOSIS — I129 Hypertensive chronic kidney disease with stage 1 through stage 4 chronic kidney disease, or unspecified chronic kidney disease: Secondary | ICD-10-CM | POA: Diagnosis present

## 2014-01-16 DIAGNOSIS — J9600 Acute respiratory failure, unspecified whether with hypoxia or hypercapnia: Secondary | ICD-10-CM | POA: Diagnosis present

## 2014-01-16 DIAGNOSIS — I739 Peripheral vascular disease, unspecified: Secondary | ICD-10-CM | POA: Diagnosis present

## 2014-01-16 DIAGNOSIS — I498 Other specified cardiac arrhythmias: Secondary | ICD-10-CM | POA: Diagnosis present

## 2014-01-16 DIAGNOSIS — I5189 Other ill-defined heart diseases: Secondary | ICD-10-CM

## 2014-01-16 DIAGNOSIS — I441 Atrioventricular block, second degree: Secondary | ICD-10-CM | POA: Diagnosis present

## 2014-01-16 DIAGNOSIS — Z79899 Other long term (current) drug therapy: Secondary | ICD-10-CM

## 2014-01-16 DIAGNOSIS — I4729 Other ventricular tachycardia: Secondary | ICD-10-CM | POA: Diagnosis not present

## 2014-01-16 DIAGNOSIS — I743 Embolism and thrombosis of arteries of the lower extremities: Secondary | ICD-10-CM | POA: Diagnosis present

## 2014-01-16 DIAGNOSIS — I951 Orthostatic hypotension: Secondary | ICD-10-CM | POA: Diagnosis present

## 2014-01-16 DIAGNOSIS — I671 Cerebral aneurysm, nonruptured: Secondary | ICD-10-CM | POA: Diagnosis present

## 2014-01-16 DIAGNOSIS — K589 Irritable bowel syndrome without diarrhea: Secondary | ICD-10-CM | POA: Diagnosis present

## 2014-01-16 DIAGNOSIS — R0789 Other chest pain: Secondary | ICD-10-CM

## 2014-01-16 DIAGNOSIS — I2489 Other forms of acute ischemic heart disease: Secondary | ICD-10-CM

## 2014-01-16 DIAGNOSIS — J449 Chronic obstructive pulmonary disease, unspecified: Secondary | ICD-10-CM | POA: Diagnosis present

## 2014-01-16 DIAGNOSIS — I248 Other forms of acute ischemic heart disease: Secondary | ICD-10-CM

## 2014-01-16 DIAGNOSIS — Z23 Encounter for immunization: Secondary | ICD-10-CM | POA: Diagnosis not present

## 2014-01-16 DIAGNOSIS — Z85828 Personal history of other malignant neoplasm of skin: Secondary | ICD-10-CM | POA: Diagnosis not present

## 2014-01-16 DIAGNOSIS — I959 Hypotension, unspecified: Secondary | ICD-10-CM | POA: Diagnosis present

## 2014-01-16 DIAGNOSIS — I059 Rheumatic mitral valve disease, unspecified: Secondary | ICD-10-CM | POA: Diagnosis present

## 2014-01-16 DIAGNOSIS — I70209 Unspecified atherosclerosis of native arteries of extremities, unspecified extremity: Secondary | ICD-10-CM

## 2014-01-16 DIAGNOSIS — Z885 Allergy status to narcotic agent status: Secondary | ICD-10-CM

## 2014-01-16 DIAGNOSIS — E785 Hyperlipidemia, unspecified: Secondary | ICD-10-CM

## 2014-01-16 DIAGNOSIS — I214 Non-ST elevation (NSTEMI) myocardial infarction: Secondary | ICD-10-CM

## 2014-01-16 DIAGNOSIS — I5033 Acute on chronic diastolic (congestive) heart failure: Principal | ICD-10-CM | POA: Diagnosis present

## 2014-01-16 DIAGNOSIS — K219 Gastro-esophageal reflux disease without esophagitis: Secondary | ICD-10-CM

## 2014-01-16 DIAGNOSIS — F17200 Nicotine dependence, unspecified, uncomplicated: Secondary | ICD-10-CM

## 2014-01-16 DIAGNOSIS — N189 Chronic kidney disease, unspecified: Secondary | ICD-10-CM

## 2014-01-16 DIAGNOSIS — E8770 Fluid overload, unspecified: Secondary | ICD-10-CM

## 2014-01-16 DIAGNOSIS — R7989 Other specified abnormal findings of blood chemistry: Secondary | ICD-10-CM

## 2014-01-16 DIAGNOSIS — I10 Essential (primary) hypertension: Secondary | ICD-10-CM

## 2014-01-16 DIAGNOSIS — I2582 Chronic total occlusion of coronary artery: Secondary | ICD-10-CM | POA: Diagnosis present

## 2014-01-16 DIAGNOSIS — E8779 Other fluid overload: Secondary | ICD-10-CM

## 2014-01-16 DIAGNOSIS — R0602 Shortness of breath: Secondary | ICD-10-CM | POA: Diagnosis present

## 2014-01-16 DIAGNOSIS — R778 Other specified abnormalities of plasma proteins: Secondary | ICD-10-CM

## 2014-01-16 DIAGNOSIS — I2510 Atherosclerotic heart disease of native coronary artery without angina pectoris: Secondary | ICD-10-CM | POA: Diagnosis present

## 2014-01-16 DIAGNOSIS — J189 Pneumonia, unspecified organism: Secondary | ICD-10-CM

## 2014-01-16 DIAGNOSIS — I25110 Atherosclerotic heart disease of native coronary artery with unstable angina pectoris: Secondary | ICD-10-CM

## 2014-01-16 HISTORY — PX: SUPRAVENTRICULAR TACHYCARDIA ABLATION: SHX5492

## 2014-01-16 LAB — COMPREHENSIVE METABOLIC PANEL
ALBUMIN: 4 g/dL (ref 3.5–5.2)
ALT: 19 U/L (ref 0–53)
ANION GAP: 15 (ref 5–15)
AST: 23 U/L (ref 0–37)
Alkaline Phosphatase: 79 U/L (ref 39–117)
BILIRUBIN TOTAL: 0.3 mg/dL (ref 0.3–1.2)
BUN: 19 mg/dL (ref 6–23)
CHLORIDE: 95 meq/L — AB (ref 96–112)
CO2: 26 mEq/L (ref 19–32)
CREATININE: 1.6 mg/dL — AB (ref 0.50–1.35)
Calcium: 11.1 mg/dL — ABNORMAL HIGH (ref 8.4–10.5)
GFR calc Af Amer: 49 mL/min — ABNORMAL LOW (ref >90)
GFR calc non Af Amer: 42 mL/min — ABNORMAL LOW (ref >90)
Glucose, Bld: 145 mg/dL — ABNORMAL HIGH (ref 70–99)
Potassium: 4.7 mEq/L (ref 3.7–5.3)
Sodium: 136 mEq/L — ABNORMAL LOW (ref 137–147)
Total Protein: 8.8 g/dL — ABNORMAL HIGH (ref 6.0–8.3)

## 2014-01-16 LAB — I-STAT TROPONIN, ED: Troponin i, poc: 0.21 ng/mL (ref 0.00–0.08)

## 2014-01-16 LAB — URINALYSIS, ROUTINE W REFLEX MICROSCOPIC
BILIRUBIN URINE: NEGATIVE
Glucose, UA: NEGATIVE mg/dL
HGB URINE DIPSTICK: NEGATIVE
Ketones, ur: NEGATIVE mg/dL
Leukocytes, UA: NEGATIVE
NITRITE: NEGATIVE
PROTEIN: NEGATIVE mg/dL
SPECIFIC GRAVITY, URINE: 1.01 (ref 1.005–1.030)
UROBILINOGEN UA: 0.2 mg/dL (ref 0.0–1.0)
pH: 6.5 (ref 5.0–8.0)

## 2014-01-16 LAB — D-DIMER, QUANTITATIVE: D-Dimer, Quant: 0.43 ug/mL-FEU (ref 0.00–0.48)

## 2014-01-16 LAB — PROTIME-INR
INR: 0.92 (ref 0.00–1.49)
Prothrombin Time: 12.4 seconds (ref 11.6–15.2)

## 2014-01-16 LAB — CBC WITH DIFFERENTIAL/PLATELET
BASOS ABS: 0.1 10*3/uL (ref 0.0–0.1)
BASOS PCT: 0 % (ref 0–1)
EOS PCT: 2 % (ref 0–5)
Eosinophils Absolute: 0.3 10*3/uL (ref 0.0–0.7)
HEMATOCRIT: 41.2 % (ref 39.0–52.0)
Hemoglobin: 13.6 g/dL (ref 13.0–17.0)
Lymphocytes Relative: 13 % (ref 12–46)
Lymphs Abs: 1.8 10*3/uL (ref 0.7–4.0)
MCH: 29.4 pg (ref 26.0–34.0)
MCHC: 33 g/dL (ref 30.0–36.0)
MCV: 89.2 fL (ref 78.0–100.0)
MONO ABS: 1.3 10*3/uL — AB (ref 0.1–1.0)
MONOS PCT: 9 % (ref 3–12)
Neutro Abs: 10 10*3/uL — ABNORMAL HIGH (ref 1.7–7.7)
Neutrophils Relative %: 76 % (ref 43–77)
Platelets: 396 10*3/uL (ref 150–400)
RBC: 4.62 MIL/uL (ref 4.22–5.81)
RDW: 14.2 % (ref 11.5–15.5)
WBC: 13.4 10*3/uL — ABNORMAL HIGH (ref 4.0–10.5)

## 2014-01-16 LAB — TSH: TSH: 2.38 u[IU]/mL (ref 0.350–4.500)

## 2014-01-16 LAB — MAGNESIUM: Magnesium: 1.9 mg/dL (ref 1.5–2.5)

## 2014-01-16 LAB — PRO B NATRIURETIC PEPTIDE: Pro B Natriuretic peptide (BNP): 2047 pg/mL — ABNORMAL HIGH (ref 0–125)

## 2014-01-16 LAB — TROPONIN I
TROPONIN I: 2.76 ng/mL — AB (ref <0.30)
Troponin I: 2.04 ng/mL (ref <0.30)
Troponin I: 2.48 ng/mL (ref <0.30)

## 2014-01-16 LAB — I-STAT CG4 LACTIC ACID, ED: Lactic Acid, Venous: 3.44 mmol/L — ABNORMAL HIGH (ref 0.5–2.2)

## 2014-01-16 LAB — MRSA PCR SCREENING: MRSA by PCR: NEGATIVE

## 2014-01-16 SURGERY — SUPRAVENTRICULAR TACHYCARDIA ABLATION
Anesthesia: LOCAL

## 2014-01-16 MED ORDER — AZITHROMYCIN 250 MG PO TABS
500.0000 mg | ORAL_TABLET | Freq: Once | ORAL | Status: AC
  Administered 2014-01-16: 500 mg via ORAL
  Filled 2014-01-16: qty 2

## 2014-01-16 MED ORDER — ONDANSETRON HCL 4 MG PO TABS: 4.0000 mg | ORAL_TABLET | Freq: Four times a day (QID) | ORAL | Status: DC | PRN

## 2014-01-16 MED ORDER — ASPIRIN 325 MG PO TABS
325.0000 mg | ORAL_TABLET | Freq: Every day | ORAL | Status: DC
  Administered 2014-01-18 – 2014-01-19 (×2): 325 mg via ORAL
  Filled 2014-01-16 (×3): qty 1

## 2014-01-16 MED ORDER — ACETAMINOPHEN 325 MG PO TABS: 650.0000 mg | ORAL_TABLET | Freq: Four times a day (QID) | ORAL | Status: DC | PRN

## 2014-01-16 MED ORDER — METOPROLOL TARTRATE 50 MG PO TABS
50.0000 mg | ORAL_TABLET | Freq: Two times a day (BID) | ORAL | Status: DC
  Administered 2014-01-16: 50 mg via ORAL
  Filled 2014-01-16: qty 1
  Filled 2014-01-16: qty 2
  Filled 2014-01-16: qty 1

## 2014-01-16 MED ORDER — FENTANYL CITRATE 0.05 MG/ML IJ SOLN
INTRAMUSCULAR | Status: AC
  Filled 2014-01-16: qty 2

## 2014-01-16 MED ORDER — DOCUSATE SODIUM 100 MG PO CAPS
100.0000 mg | ORAL_CAPSULE | Freq: Two times a day (BID) | ORAL | Status: DC | PRN
  Filled 2014-01-16: qty 1

## 2014-01-16 MED ORDER — DEXTROSE 5 % IV SOLN
1.0000 g | INTRAVENOUS | Status: DC
  Administered 2014-01-17 – 2014-01-20 (×4): 1 g via INTRAVENOUS
  Filled 2014-01-16 (×5): qty 10

## 2014-01-16 MED ORDER — ONDANSETRON HCL 4 MG/2ML IJ SOLN
4.0000 mg | Freq: Four times a day (QID) | INTRAMUSCULAR | Status: DC | PRN
Start: 2014-01-16 — End: 2014-01-20
  Administered 2014-01-19: 15:00:00 4 mg via INTRAVENOUS
  Filled 2014-01-16: qty 2

## 2014-01-16 MED ORDER — SODIUM CHLORIDE 0.9 % IV SOLN: 250.0000 mL | INTRAVENOUS | Status: DC | PRN

## 2014-01-16 MED ORDER — FINASTERIDE 5 MG PO TABS
5.0000 mg | ORAL_TABLET | Freq: Every day | ORAL | Status: DC
  Administered 2014-01-16 – 2014-01-20 (×5): 5 mg via ORAL
  Filled 2014-01-16 (×7): qty 1

## 2014-01-16 MED ORDER — ATORVASTATIN CALCIUM 80 MG PO TABS
80.0000 mg | ORAL_TABLET | Freq: Every day | ORAL | Status: DC
  Administered 2014-01-16 – 2014-01-19 (×4): 80 mg via ORAL
  Filled 2014-01-16 (×6): qty 1

## 2014-01-16 MED ORDER — ASPIRIN 325 MG PO TABS
325.0000 mg | ORAL_TABLET | Freq: Every day | ORAL | Status: DC
  Administered 2014-01-16: 325 mg via ORAL
  Filled 2014-01-16: qty 1

## 2014-01-16 MED ORDER — SODIUM CHLORIDE 0.9 % IJ SOLN: 3.0000 mL | Freq: Two times a day (BID) | INTRAMUSCULAR | Status: DC

## 2014-01-16 MED ORDER — PNEUMOCOCCAL VAC POLYVALENT 25 MCG/0.5ML IJ INJ
0.5000 mL | INJECTION | INTRAMUSCULAR | Status: DC
  Filled 2014-01-16: qty 0.5

## 2014-01-16 MED ORDER — SIMVASTATIN 20 MG PO TABS
20.0000 mg | ORAL_TABLET | Freq: Every day | ORAL | Status: DC
  Filled 2014-01-16: qty 1

## 2014-01-16 MED ORDER — FUROSEMIDE 10 MG/ML IJ SOLN
40.0000 mg | Freq: Once | INTRAMUSCULAR | Status: AC
  Administered 2014-01-16: 40 mg via INTRAVENOUS
  Filled 2014-01-16: qty 4

## 2014-01-16 MED ORDER — DEXTROSE 5 % IV SOLN
500.0000 mg | INTRAVENOUS | Status: DC
  Administered 2014-01-17 – 2014-01-20 (×4): 500 mg via INTRAVENOUS
  Filled 2014-01-16 (×5): qty 500

## 2014-01-16 MED ORDER — ACETAMINOPHEN 650 MG RE SUPP: 650.0000 mg | Freq: Four times a day (QID) | RECTAL | Status: DC | PRN

## 2014-01-16 MED ORDER — METOPROLOL SUCCINATE ER 50 MG PO TB24
50.0000 mg | ORAL_TABLET | Freq: Two times a day (BID) | ORAL | Status: DC
  Filled 2014-01-16 (×2): qty 1

## 2014-01-16 MED ORDER — ASPIRIN 325 MG PO TABS
325.0000 mg | ORAL_TABLET | Freq: Once | ORAL | Status: AC
  Administered 2014-01-16: 325 mg via ORAL
  Filled 2014-01-16: qty 1

## 2014-01-16 MED ORDER — AMLODIPINE BESYLATE 5 MG PO TABS
5.0000 mg | ORAL_TABLET | Freq: Every day | ORAL | Status: DC
  Administered 2014-01-16 – 2014-01-17 (×2): 5 mg via ORAL
  Filled 2014-01-16 (×2): qty 1

## 2014-01-16 MED ORDER — KETAMINE HCL 10 MG/ML IJ SOLN
200.0000 mg | Freq: Once | INTRAMUSCULAR | Status: AC
  Administered 2014-01-16: 80 mg via INTRAVENOUS

## 2014-01-16 MED ORDER — CHLORHEXIDINE GLUCONATE CLOTH 2 % EX PADS
6.0000 | MEDICATED_PAD | Freq: Once | CUTANEOUS | Status: AC
  Administered 2014-01-16: 6 via TOPICAL

## 2014-01-16 MED ORDER — HEPARIN SODIUM (PORCINE) 5000 UNIT/ML IJ SOLN
5000.0000 [IU] | Freq: Three times a day (TID) | INTRAMUSCULAR | Status: DC
  Filled 2014-01-16 (×4): qty 1

## 2014-01-16 MED ORDER — SODIUM CHLORIDE 0.9 % IV SOLN
250.0000 mL | INTRAVENOUS | Status: DC | PRN
  Administered 2014-01-17: 250 mL via INTRAVENOUS

## 2014-01-16 MED ORDER — BUPIVACAINE HCL (PF) 0.25 % IJ SOLN
INTRAMUSCULAR | Status: AC
  Filled 2014-01-16: qty 60

## 2014-01-16 MED ORDER — METOPROLOL SUCCINATE ER 50 MG PO TB24
50.0000 mg | ORAL_TABLET | Freq: Two times a day (BID) | ORAL | Status: DC
  Administered 2014-01-16: 50 mg via ORAL
  Filled 2014-01-16 (×4): qty 1

## 2014-01-16 MED ORDER — MIDAZOLAM HCL 5 MG/5ML IJ SOLN
INTRAMUSCULAR | Status: AC
  Filled 2014-01-16: qty 5

## 2014-01-16 MED ORDER — ASPIRIN 81 MG PO CHEW
81.0000 mg | CHEWABLE_TABLET | ORAL | Status: DC
Start: 2014-01-17 — End: 2014-01-16

## 2014-01-16 MED ORDER — ALPRAZOLAM 0.5 MG PO TABS
2.0000 mg | ORAL_TABLET | Freq: Two times a day (BID) | ORAL | Status: DC | PRN
  Administered 2014-01-16 – 2014-01-17 (×3): 2 mg via ORAL
  Filled 2014-01-16 (×3): qty 4

## 2014-01-16 MED ORDER — SODIUM CHLORIDE 0.9 % IV SOLN: 1.0000 mL/kg/h | INTRAVENOUS | Status: DC

## 2014-01-16 MED ORDER — NORTRIPTYLINE HCL 25 MG PO CAPS
50.0000 mg | ORAL_CAPSULE | Freq: Every day | ORAL | Status: DC
  Administered 2014-01-16 – 2014-01-19 (×4): 50 mg via ORAL
  Filled 2014-01-16 (×7): qty 2

## 2014-01-16 MED ORDER — GABAPENTIN 300 MG PO CAPS
300.0000 mg | ORAL_CAPSULE | Freq: Three times a day (TID) | ORAL | Status: DC
  Administered 2014-01-16 – 2014-01-20 (×13): 300 mg via ORAL
  Filled 2014-01-16 (×16): qty 1

## 2014-01-16 MED ORDER — SODIUM CHLORIDE 0.9 % IJ SOLN: 3.0000 mL | INTRAMUSCULAR | Status: DC | PRN

## 2014-01-16 MED ORDER — PANTOPRAZOLE SODIUM 40 MG PO TBEC
40.0000 mg | DELAYED_RELEASE_TABLET | Freq: Every day | ORAL | Status: DC
  Administered 2014-01-16 – 2014-01-20 (×5): 40 mg via ORAL
  Filled 2014-01-16 (×5): qty 1

## 2014-01-16 MED ORDER — SODIUM CHLORIDE 0.9 % IJ SOLN
3.0000 mL | Freq: Two times a day (BID) | INTRAMUSCULAR | Status: DC
  Administered 2014-01-16: 3 mL via INTRAVENOUS

## 2014-01-16 MED ORDER — ENOXAPARIN SODIUM 40 MG/0.4ML ~~LOC~~ SOLN
40.0000 mg | SUBCUTANEOUS | Status: DC
  Administered 2014-01-18 – 2014-01-20 (×3): 40 mg via SUBCUTANEOUS
  Filled 2014-01-16 (×5): qty 0.4

## 2014-01-16 MED ORDER — DEXTROSE 5 % IV SOLN
2.0000 g | Freq: Once | INTRAVENOUS | Status: AC
  Administered 2014-01-16: 2 g via INTRAVENOUS
  Filled 2014-01-16: qty 2

## 2014-01-16 MED ORDER — TAMSULOSIN HCL 0.4 MG PO CAPS
0.4000 mg | ORAL_CAPSULE | Freq: Every day | ORAL | Status: DC
  Administered 2014-01-16 – 2014-01-20 (×5): 0.4 mg via ORAL
  Filled 2014-01-16 (×5): qty 1

## 2014-01-16 MED ORDER — SODIUM CHLORIDE 0.9 % IJ SOLN
3.0000 mL | INTRAMUSCULAR | Status: DC | PRN
Start: 2014-01-16 — End: 2014-01-17

## 2014-01-16 MED ORDER — SODIUM CHLORIDE 0.9 % IV BOLUS (SEPSIS)
1000.0000 mL | Freq: Once | INTRAVENOUS | Status: AC
  Administered 2014-01-16: 1000 mL via INTRAVENOUS

## 2014-01-16 MED ORDER — ONDANSETRON HCL 4 MG/2ML IJ SOLN: 4.0000 mg | Freq: Four times a day (QID) | INTRAMUSCULAR | Status: DC | PRN

## 2014-01-16 MED ORDER — ACETAMINOPHEN 325 MG PO TABS
650.0000 mg | ORAL_TABLET | ORAL | Status: DC | PRN
  Administered 2014-01-17 – 2014-01-18 (×2): 650 mg via ORAL
  Filled 2014-01-16 (×2): qty 2

## 2014-01-16 MED ORDER — LOSARTAN POTASSIUM 50 MG PO TABS
100.0000 mg | ORAL_TABLET | Freq: Every day | ORAL | Status: DC
  Filled 2014-01-16: qty 2

## 2014-01-16 MED ORDER — HEPARIN (PORCINE) IN NACL 2-0.9 UNIT/ML-% IJ SOLN
INTRAMUSCULAR | Status: AC
  Filled 2014-01-16: qty 500

## 2014-01-16 MED ORDER — HEPARIN (PORCINE) IN NACL 100-0.45 UNIT/ML-% IJ SOLN
1100.0000 [IU]/h | INTRAMUSCULAR | Status: DC
  Administered 2014-01-16: 1100 [IU]/h via INTRAVENOUS
  Filled 2014-01-16 (×2): qty 250

## 2014-01-16 MED ORDER — CETYLPYRIDINIUM CHLORIDE 0.05 % MT LIQD
7.0000 mL | Freq: Two times a day (BID) | OROMUCOSAL | Status: DC
  Administered 2014-01-16 – 2014-01-17 (×3): 7 mL via OROMUCOSAL

## 2014-01-16 NOTE — CV Procedure (Signed)
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 After the usual preparation and draping, intravenous Versed and fentanyl were used for sedation. A 6 French hexapolar catheter was inserted percutaneously into the right jugular vein and advanced to the coronary sinus under fluoroscopic guidance. A 6 French quadripolar catheter was inserted percutaneously into the right femoral vein and advanced to the His bundle region. A 6 French quadripolar catheter was inserted percutaneously into the right femoral vein and advanced under fluoroscopic guidance to the right ventricle. After measurement of the basic intervals, rapid ventricular pacing was carried out from the right ventricle and stepwise decreased down to 390 ms where AV block was demonstrated. During rapid atrial pacing, the patient would easily go into SVT. Next programmed atrial stimulation was carried out from the coronary sinus and right atrium at a pacing cycle length of 500 ms. The S1-S2 interval stepwise decrease down to 340 ms where her the AV node ERP was observed. During programmed atrial stimulation, SVT was easily inducible. The PR interval was greater than the R-R interval. During tachycardia, PVCs were placed at the time of his bundle refractoriness, and did not preexcite the atrium. During tachycardia, ventricular pacing was carried out resulting in termination of the tachycardia. With all of the above, a diagnosis of AV node reentrant tachycardia was made. 2 radiofrequency energy applications were then delivered. This  demonstrated accelerated junctional rhythm. Following this, additional rapid atrial pacing was carried out demonstrating AV Wenckebach at 590 ms. The patient was observed for 30 minutes. There is no inducible SVT. The catheters were removed and the patient was then returned to the recovery area where the sheath was removed.  Complications: There were no major procedure complications  Results. 1. Baseline ECG. Baseline ECG demonstrates sinus rhythm with normal axis and intervals. 2. Baseline intervals. Sinus no cycle length was 767 ms. The QRS duration 95 ms. The HV interval was 56 ms. The AH interval was 170 ms. 3. Rapid ventricular pacing. Rapid ventricular pacing was carried out from the right ventricle and stepwise decreased down to 400 ms were VA block was observed. During rapid ventricular pacing, the atrial activation sequence was midline and mental. 4. Programmed ventricular stimulation. Programmed ventricular stimulation was carried out from the right ventricle at a pacing cycle of 500 ms. The S1 and S2 interval was stepwise decrease down to 340 ms where the retrograde AV node ERP was observed. During programmed ventricular stimulation, the atrial activation sequence was midline and decremental.  5. Rapid atrial pacing. Rapid atrial pacing was carried out from the coronary sinus in the high right atrium at a pacing cycle length of 500 ms. Pacing cycle length was decreased down to 390 ms where AV block was observed. During rapid atrial pacing, there is easily inducible SVT.  6. Programmed atrial stimulation. Atrial stimulation was carried out from the atrium at a pacing cycle length of 500 ms. The S1-S2 interval was stepwise decreased down to 340 ms where the AV node ERP was observed. During programmed atrial stimulation, there are multiple AH jumps, echo beats, and inducible SVT.   7.arrhythmias observed. AV node reentrant tachycardia. Cycle length 420 ms. Method of induction was with  rapid atrial  pacing and programmed atrial stimulation, duration sustained. Method of termination was with rapid ventricular pacing or rapid atrial pacing.  8. Mapping. Mapping of the patient's SVT demonstrated a large Koch's triangle.  9. Radiofrequency energy application. A total of 2 radiofrequency energy applications were delivered. During radiofrequency energy application, there is accelerated junctional rhythm. Following catheter ablation, there is no inducible SVT.   Conclusion: Successful electrophysiologic study and catheter ablation of easily inducible AV node reentrant tachycardia. Following ablation, there was no inducible SVT, and no evidence of any residual slow pathway conduction.   Cristopher Peru, M.D.

## 2014-01-16 NOTE — H&P (Signed)
[facility] History and Physical  Patient: Curtis [name]  WNI:[id number]  DOB: 04/14/1946  DOS: the patient was seen and examined on 01/16/2014 PCP: [name], MD  Chief Complaint: Shortness of breath and chest pain  HPI: Curtis [name] is a 68 y.o. male with Past medical history of GERD, hypertension, diastolic dysfunction, pulmonary asbestosis, COPD, cerebral aneurysm. The patient presented with complaints of shortness of breath along with chest pain. Patient mentions that at around 7 PM he started having shortness of breath which was progressively worsening. This shortness of breath was associated with cough and nausea. Later on he started having numbness in his left arm and chest pressure which was also progressively worsened. He called his son who came around. Patient took a Xanax with patient's symptoms improved. At around 11 PM patient's son left, and suddenly the patient started having complaints of chest pain, palpitation, shortness of breath, sweating worse than prior episode. At which time he calls him again and was brought here to the ER. Patient complains of some dizziness initially as well which is resolved. At the time of my evaluation patient denies any chest pain. Patient mentions his breathing has improved 90%. Patient has a chronic cough which is unchanged at present No choking episode identified by patient. No vomiting no diarrhea no burning urination. No recent travel no recent surgery, immobilization or recent change in his medication. Patient had a negative stress test 2007. Recent echocardiogram showed normal ejection fraction with diastolic dysfunction.  The patient is coming from home. And at his baseline independent for most of his ADL.  Review of Systems: as mentioned in the history of present illness.  A Comprehensive review of the other systems is negative.  Past Medical History  Diagnosis Date  . Arthritis   . SOB (shortness of breath)    . Pulmonary asbestosis   . Hernia of unspecified site of abdominal cavity without mention of obstruction or gangrene     hiatal  . GERD (gastroesophageal reflux disease)   . Depression   . Diverticulitis     hospital 2011 [facility]  . Diverticulosis   . Reflex sympathetic dystrophy   . 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 Hypertension   . IBS (irritable bowel syndrome)    Past Surgical History  Procedure Laterality Date  . Nissen fundoplication    . Right elbow surgery      x 2  . Right knee arthroscopy    . Left shoulder       x 3  . Squamous cell skin cancer      Left Hand  . Cataract extraction     Social History:  reports that he quit smoking about 8 years ago. His smoking use included Cigarettes. He has a 125 pack-year smoking history. He quit smokeless tobacco use about 20 months ago. He reports that he does not drink alcohol or use illicit drugs.  Allergies  Allergen Reactions  . Codeine Itching    Family History  Problem Relation Age of Onset  . Colon cancer Mother   . Colon cancer Maternal Aunt   . Colon cancer Maternal Uncle     Prior to Admission medications   Medication Sig Start Date End Date Taking? Authorizing Provider  alprazolam [name]) 2 MG tablet Take 2 mg by mouth 2 (two) times daily.   Yes Historical Provider, MD  aspirin 325 MG tablet Take 1 tablet (325 mg total) by mouth daily. 09/08/13  Yes [name], MD  docusate sodium (  COLACE) 100 MG capsule Take 100 mg by mouth 2 (two) times daily as needed for mild constipation.   Yes Historical Provider, MD  finasteride (PROSCAR) 5 MG tablet Take 5 mg by mouth every morning.    Yes Historical Provider, MD  gabapentin (NEURONTIN) 300 MG capsule Take 300 mg by mouth 3 (three) times daily.    Yes Historical Provider, MD  losartan (COZAAR) 100 MG tablet Take 1 tablet (100 mg total) by mouth daily. 09/08/13  Yes [name], MD  metoprolol (LOPRESSOR) 50 MG tablet Take 50 mg by mouth 2 (two) times daily.    Yes Historical  Provider, MD  nortriptyline (PAMELOR) 50 MG capsule Take 50 mg by mouth at bedtime.   Yes Historical Provider, MD  omeprazole (PRILOSEC) 20 MG capsule Take 20 mg by mouth daily.   Yes Historical Provider, MD  simvastatin (ZOCOR) 20 MG tablet Take 1 tablet (20 mg total) by mouth at bedtime. 09/08/13  Yes [name], MD  tamsulosin (FLOMAX) 0.4 MG CAPS capsule Take 1 capsule (0.4 mg total) by mouth daily. 09/08/13  Yes [name], MD    Physical Exam: Filed Vitals:   01/16/14 0330 01/16/14 0345 01/16/14 0400 01/16/14 0415  BP: 152/84 163/78 152/76 141/84  Pulse: 74 75 73 72  Resp: 20 19 19 23   SpO2: 100% 100% 100% 99%    General: Alert, Awake and Oriented to Time, Place and Person. Appear in mild distress Eyes: PERRL ENT: Oral Mucosa clear moist. Neck: Mild JVD Cardiovascular: S1 and S2 Present, aortic systolic Murmur, Peripheral Pulses Present Respiratory: Bilateral Air entry equal and Decreased, bilateral Crackles, no wheezes Abdomen: Bowel Sound Present, Soft and Non tender Skin: No Rash Extremities: Trace Pedal edema, no calf tenderness Neurologic: Grossly no focal neuro deficit.  Labs on Admission:  CBC:  Recent Labs Lab 01/16/14 0303  WBC 13.4*  NEUTROABS 10.0*  HGB 13.6  HCT 41.2  MCV 89.2  PLT 396    CMP     Component Value Date/Time   NA 136* 01/16/2014 0303   K 4.7 01/16/2014 0303   CL 95* 01/16/2014 0303   CO2 26 01/16/2014 0303   GLUCOSE 145* 01/16/2014 0303   BUN 19 01/16/2014 0303   CREATININE 1.60* 01/16/2014 0303   CALCIUM 11.1* 01/16/2014 0303   PROT 8.8* 01/16/2014 0303   ALBUMIN 4.0 01/16/2014 0303   AST 23 01/16/2014 0303   ALT 19 01/16/2014 0303   ALKPHOS 79 01/16/2014 0303   BILITOT 0.3 01/16/2014 0303   GFRNONAA 42* 01/16/2014 0303   GFRAA 49* 01/16/2014 0303    No results found for this basename: LIPASE, AMYLASE,  in the last 168 hours No results found for this basename: AMMONIA,  in the last 168 hours  No results found for this basename:  CKTOTAL, CKMB, CKMBINDEX, TROPONINI,  in the last 168 hours BNP (last 3 results)  Recent Labs  01/16/14 0237  PROBNP 2047.0*    Radiological Exams on Admission: Dg Chest Portable 1 View  01/16/2014   CLINICAL DATA:  Chest pain and shortness of breath.  EXAM: PORTABLE CHEST - 1 VIEW  COMPARISON:  Chest radiograph performed 09/05/2013  FINDINGS: The lungs are well-aerated. Interstitial opacities are again noted, with a peripheral predominance. This appears worsened from the prior study, with underlying vascular congestion. This may reflect pneumonia or pulmonary edema, superimposed on the patient's chronic interstitial lung disease. There is no evidence of pleural effusion or pneumothorax.  The cardiomediastinal silhouette is  mildly enlarged. An external pacing pad is noted. No acute osseous abnormalities are seen.  IMPRESSION: Interstitial opacities are worsened from the prior study, with underlying vascular congestion and mild cardiomegaly. This may reflect pulmonary edema or pneumonia, superimposed on the patient's chronic interstitial lung disease.   Electronically Signed   By: Garald Balding M.D.   On: 01/16/2014 02:14   EKG: Independently reviewed.  Initial EKG was SVT with heart rate of 150 with rate related diffuse ST depression. Patient was cardioverted Repeat EKG was showing T wave inversions in lead 3, prolonged PR interval as well as poor R wave progression with resolution of SVT as well as ST depression.  Assessment/Plan Principal Problem:   Acute decompensated heart failure Active Problems:   COPD   GERD   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)   AKI (acute kidney injury)   Aneurysm, cerebral, nonruptured   Community acquired pneumonia   SVT (supraventricular tachycardia)   Elevated troponin   Chest tightness or pressure   1. Acute decompensated heart failure The patient is presenting with complaints of sudden onset of shortness of breath along with chest pressure radiating to his left arm  associated with palpitations diaphoresis and nausea. With his typical anginal symptoms patient initially presented with SVT with hypotension with blood pressure in 90s. Patient was initially given IV fluids and cardioverted emergently in the ER. At the time of my evaluation patient mentions his symptoms are improving. Patient has elevated proBNP, elevated troponin, chest x-ray showing pulmonary vascular congestion with bibasilar crackles on exam. Patient was initially cyanotic on arrival currently saturating 94% on 1.5 L of nasal cannula. With this the patient will be admitted in the step down unit there I would continue to monitor serial troponin. Check d-dimer TSH. Check echocardiogram, continue with aspirin. Discuss with cardiology on phone patient will have formal consultation in morning.  2. Possible pneumonia. Chest x-ray has read as possible pneumonia With leukocytosis and shortness of breath patient was given IV ceftriaxone azithromycin. I would continue with the same at present. Follow cultures.  3. Mild acute kidney injury. Likely due to hypotension, tachycardia. Continue to monitor BMP. Avoid nephrotoxic medication.  4. BPH Continue Flomax.  5. Cerebral aneurysm. Patient had incidental aneurysm of his right M1 segment as well as ACA He also was found to have stenosis on M1 and temporal branch of MCA Continue to monitor. Recommendation was to repeat MRI 6 months later in November 15.   Consults: Cardiology on phone  DVT Prophylaxis: subcutaneous Heparin Nutrition: N.p.o. except medication  Code Status: Full  Family Communication: Son  was present at bedside, opportunity was given to ask question and all questions were answered satisfactorily at the time of interview. Disposition: Admitted to inpatient in step-down unit.  Author: Berle Mull, MD [facility] Pager: [phone] 01/16/2014, 5:05 AM    If 7PM-7AM, please contact  night-coverage [email or web address] Password [email or web address]  **Disclaimer: This note may have been dictated with voice recognition software. Similar sounding words can inadvertently be transcribed and this note may contain transcription errors which may not have been corrected upon publication of note.**

## 2014-01-16 NOTE — Progress Notes (Signed)
ANTICOAGULATION CONSULT NOTE - Initial Consult  Pharmacy Consult for heparin Indication: chest pain/ACS  Allergies  Allergen Reactions  . Codeine Itching    Patient Measurements: Height: 6' (182.9 cm) Weight: 198 lb 10.2 oz (90.1 kg) IBW/kg (Calculated) : 77.6  Vital Signs: Temp: 98.5 F (36.9 C) (09/14 0642) Temp src: Oral (09/14 0642) BP: 155/78 mmHg (09/14 0530) Pulse Rate: 71 (09/14 0530)  Labs:  Recent Labs  01/16/14 0303 01/16/14 0453  HGB 13.6  --   HCT 41.2  --   PLT 396  --   LABPROT 12.4  --   INR 0.92  --   CREATININE 1.60*  --   TROPONINI  --  2.04*    Estimated Creatinine Clearance: 47.8 ml/min (by C-G formula based on Cr of 1.6).   Medical History: Past Medical History  Diagnosis Date  . Arthritis   . SOB (shortness of breath)   . Pulmonary asbestosis   . Hernia of unspecified site of abdominal cavity without mention of obstruction or gangrene     hiatal  . GERD (gastroesophageal reflux disease)   . Depression   . Diverticulitis     hospital 2011 [facility]  . Diverticulosis   . Reflex sympathetic dystrophy   . 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 Hypertension   . IBS (irritable bowel syndrome)     Medications:  Prescriptions prior to admission  Medication Sig Dispense Refill  . alprazolam (XANAX) 2 MG tablet Take 2 mg by mouth 2 (two) times daily.      [name] aspirin 325 MG tablet Take 1 tablet (325 mg total) by mouth daily.  30 tablet  5  . docusate sodium (COLACE) 100 MG capsule Take 100 mg by mouth 2 (two) times daily as needed for mild constipation.      . finasteride (PROSCAR) 5 MG tablet Take 5 mg by mouth every morning.       . gabapentin (NEURONTIN) 300 MG capsule Take 300 mg by mouth 3 (three) times daily.       [name] losartan (COZAAR) 100 MG tablet Take 1 tablet (100 mg total) by mouth daily.  30 tablet  5  . metoprolol (LOPRESSOR) 50 MG tablet Take 50 mg by mouth 2 (two) times daily.       . nortriptyline (PAMELOR) 50 MG capsule Take 50 mg by mouth at  bedtime.      [name] omeprazole (PRILOSEC) 20 MG capsule Take 20 mg by mouth daily.      . simvastatin (ZOCOR) 20 MG tablet Take 1 tablet (20 mg total) by mouth at bedtime.  30 tablet  5  . tamsulosin (FLOMAX) 0.4 MG CAPS capsule Take 1 capsule (0.4 mg total) by mouth daily.  30 capsule  5   Scheduled:  . antiseptic oral rinse  7 mL Mouth Rinse BID  . aspirin  325 mg Oral Daily  . [START ON 01/17/2014] azithromycin  500 mg Intravenous Q24H  . [START ON 01/17/2014] cefTRIAXone (ROCEPHIN)  IV  1 g Intravenous Q24H  . finasteride  5 mg Oral Daily  . gabapentin  300 mg Oral TID  . losartan  100 mg Oral Daily  . metoprolol  50 mg Oral BID  . nortriptyline  50 mg Oral QHS  . pantoprazole  40 mg Oral Daily  . [START ON 01/17/2014] pneumococcal 23 valent vaccine  0.5 mL Intramuscular Tomorrow-1000  . simvastatin  20 mg Oral QHS  . sodium chloride  3 mL Intravenous Q12H  . tamsulosin  0.4 mg Oral Daily    Assessment: 68yo male c/o progressively worsening SOB and CP, tx'd from [facility] to cardiac SDU for acute decompensated heart failure with concern for ACS, to begin heparin.  Goal of Therapy:  Heparin level 0.3-0.7 units/ml Monitor platelets by anticoagulation protocol: Yes   Plan:  Will begin heparin gtt at 1100 units/hr (no bolus per admitting MD request) and monitor heparin levels and CBC.  Wynona Neat, PharmD, BCPS  01/16/2014,7:17 AM

## 2014-01-16 NOTE — Progress Notes (Signed)
  Echocardiogram 2D Echocardiogram has been performed.  Curtis [name] FRANCES 01/16/2014, 12:38 PM

## 2014-01-16 NOTE — Consult Note (Signed)
CARDIOLOGY CONSULT NOTE  Patient ID: Curtis [name] MRN: [id number] DOB/AGE: [birth year] 68 y.o.  Admit date: 01/16/2014 Primary Physician: Kathryne Eriksson Primary Cardiologist: New Reason for Consultation: Elevated troponin  HPI: 68 yo with history of COPD, asbestosis, depression, and reflex sympathetic dystrophy of LLE presented last night with chest pain, dyspnea, and palpitations.  Patient states that he has had episodes where his heart will race briefly for years.  He has been told in the past that this was due to "nerves."  Last night around 8 pm, he heart started pounding.  He developed chest tightness radiating to the left arm as well as significant dyspnea.  He called his son over and they tried to wait it out.  Symptoms continued, and he finally came to the ER around midnight. At that time, he was noted to be in SVT with HR in 150s.  SBP was in the 90s.  He was emergently cardioverted to NSR in the ER.  Chest pain and dyspnea resolved.  He is now in NSR.  No chest pain or dyspnea, feels like he's basically back to normal.  Troponin was noted to be up to 2.  CXR showed chronic interstitial lung disease with ?superimposed PNA versus edema.  Feet noted to look purple today.  Patient says they are "always like this" from his reflex sympathetic dystrophy.   Review of systems complete and found to be negative unless listed above in HPI  Past Medical History: 1. GERD: s/p Nissen fundoplication 2. HTN 3. Asbestosis 4. COPD: Heavy smoker in past.  5. H/o cerebral aneurysm: Incidentally found on 5/15 MRA done after TIA.  Plan for periodic monitoring per neurology. 6. H/o diverticulitis 7. Reflex sympathetic dystrophy left leg. 8. IBS 9. Echo (5/15) with EF 55-60%, mild MR.  10. Palpitations 11. Possible TIA in 5/15.   Family History  Problem Relation Age of Onset  . Colon cancer Mother   . Colon cancer Maternal Aunt   . Colon cancer Maternal Uncle     History   Social History  .  Marital Status: Widowed. Lives alone, son lives nearby    Spouse Name: N/A    Number of Children: N/A  . Years of Education: N/A   Occupational History  . Veteran    Social History Main Topics  . Smoking status: Former Smoker -- 2.50 packs/day for 50 years    Types: Cigarettes    Quit date: 05/05/2005  . Smokeless tobacco: Former Systems developer    Quit date: 05/03/2012  . Alcohol Use: No     Comment: last use 2013  . Drug Use: No  . Sexual Activity: Not on file     Comment: widowed, Veteran   Other Topics Concern  . Not on file   Social History Narrative   Lives in [place]     Prescriptions prior to admission  Medication Sig Dispense Refill  . alprazolam (XANAX) 2 MG tablet Take 2 mg by mouth 2 (two) times daily.      [name] aspirin 325 MG tablet Take 1 tablet (325 mg total) by mouth daily.  30 tablet  5  . docusate sodium (COLACE) 100 MG capsule Take 100 mg by mouth 2 (two) times daily as needed for mild constipation.      . finasteride (PROSCAR) 5 MG tablet Take 5 mg by mouth every morning.       . gabapentin (NEURONTIN) 300 MG capsule Take 300 mg by mouth 3 (three) times daily.       [name]  losartan (COZAAR) 100 MG tablet Take 1 tablet (100 mg total) by mouth daily.  30 tablet  5  . metoprolol (LOPRESSOR) 50 MG tablet Take 50 mg by mouth 2 (two) times daily.       . nortriptyline (PAMELOR) 50 MG capsule Take 50 mg by mouth at bedtime.      [name] omeprazole (PRILOSEC) 20 MG capsule Take 20 mg by mouth daily.      . simvastatin (ZOCOR) 20 MG tablet Take 1 tablet (20 mg total) by mouth at bedtime.  30 tablet  5  . tamsulosin (FLOMAX) 0.4 MG CAPS capsule Take 1 capsule (0.4 mg total) by mouth daily.  30 capsule  5    Physical exam Blood pressure 162/97, pulse 70, temperature 97.7 F (36.5 C), temperature source Oral, resp. rate 18, height 6' (1.829 m), weight 198 lb 10.2 oz (90.1 kg), SpO2 94.00%. General: NAD Neck: No JVD, no thyromegaly or thyroid nodule.  Lungs: Distant breath sounds  with occasional rhonchi. CV: Nondisplaced PMI.  Heart regular S1/S2, no S3/S4, no murmur.  Trace ankle edema.  No carotid bruit.  Difficult to palpate pedal pulses.   Abdomen: Soft, nontender, no hepatosplenomegaly, no distention.  Skin: Intact without lesions or rashes.  Neurologic: Alert and oriented x 3.  Psych: Normal affect. Extremities: Feet appear cyanotic.   HEENT: Normal.   Labs:   Lab Results  Component Value Date   WBC 13.4* 01/16/2014   HGB 13.6 01/16/2014   HCT 41.2 01/16/2014   MCV 89.2 01/16/2014   PLT 396 01/16/2014    Recent Labs Lab 01/16/14 0303  NA 136*  K 4.7  CL 95*  CO2 26  BUN 19  CREATININE 1.60*  CALCIUM 11.1*  PROT 8.8*  BILITOT 0.3  ALKPHOS 79  ALT 19  AST 23  GLUCOSE 145*   Lab Results  Component Value Date   TROPONINI 2.04* 01/16/2014    Lab Results  Component Value Date   CHOL 249* 09/07/2013   Lab Results  Component Value Date   HDL 24* 09/07/2013   Lab Results  Component Value Date   LDLCALC UNABLE TO CALCULATE IF TRIGLYCERIDE OVER 400 mg/dL 09/07/2013   Lab Results  Component Value Date   TRIG 503* 09/07/2013   Lab Results  Component Value Date   CHOLHDL 10.4 09/07/2013   No results found for this basename: LDLDIRECT      Radiology: - CXR: Chronic interstitial lung disease with possible superimposed PNA or edema.   EKG:  - Initial ECG with SVT, short R-P.  Possible AVNRT.  - Followup ECG NSR with 1st degree AV block  ASSESSMENT AND PLAN: 68 yo with history of COPD, asbestosis, depression, and reflex sympathetic dystrophy of LLE presented last night with chest pain, dyspnea, and palpitations. He was noted to be in SVT and was emergently cardioverted given SBP in 90s and chest pain.  Troponin noted to be elevated.  Symptoms now resolved.  1. SVT: Regular SVT with chest pain and hypotension last night, emergently cardioverted.  He has been on a beta blocker at home.  He says he has had tachypalpitations in the past, attributed to  "nerves" but never had arrhythmia diagnosed.  Possible AVNRT given short R-P on ECG.   - Change metoprolol to long-acting Toprol XL.  - Would consider ablation as he has already been on beta blocker at home and had very symptomatic episode today.  Will have EP see him.  2. Elevated troponin: Possible demand  ischemia from tachycardia/hypotension as chest pain began with a sensation of his heart pounding.  Troponin elevated to 2, but he stayed at home for several hours with chest pain and presumed SVT before coming to the ER.  However, given the typical symptoms and significant troponin rise, I think that he should have left heart cath.  - No further symptoms, creatinine up to 1.6.  I will gently hydrate him overnight and plan on LHC tomorrow as long as creatinine is coming down. Avoid LV-gram. - Continue ASA, heparin gtt, statin, metoprolol.  - Echo today is reasonable as ordered.  - Cycle troponin to peak.  3. CHF: Patient does not appear particularly volume overloaded on exam today.  BNP was elevated but in the setting of SVT for several hours.  Breathing now improved.  Would hold off on diuresis at this time, especially as planning for LHC.  Echo as above. CXR with chronic interstitial disease (prior asbestosis) with superimposed mild edema versus PNA.  4. AKI on CKD: Hold losartan, use amlodipine instead for BP control.  Gentle hydration overnight prior to cath.  5. HTN: Holding losartan, adding amlodipine, and changing to Toprol XL.  6. PAD: Cyanotic-appearing feet and difficult to palpate pedal pulses.  Will get peripheral arterial doppler evaluation.  7. Possible PNA: Covering with antibiotics.    Loralie Champagne 01/16/2014 9:57 AM

## 2014-01-16 NOTE — Progress Notes (Signed)
CARE MANAGEMENT NOTE 01/16/2014  Patient:  Curtis [name], Curtis [name]   Account Number:  [id number]  Date Initiated:  01/16/2014  Documentation initiated by:  Vestal Markin  Subjective/Objective Assessment:   medical history of GERD, hypertension, diastolic dysfunction, pulmonary asbestosis, COPD, cerebral aneurysm.  The patient presented with complaints of shortness of breath along with chest pain     Action/Plan:   will follow for dc needs/lives alone but does have family support   Anticipated DC Date:  01/19/2014   Anticipated DC Plan:  HOME/SELF CARE  In-house referral  NA      DC Planning Services  NA      [facility] Choice  NA   Choice offered to / List presented to:  NA   DME arranged  NA      DME agency  NA     [place] arranged  NA      [place] agency  NA   Status of service:  In process, will continue to follow Medicare Important Message given?   (If response is "NO", the following Medicare IM given date fields will be blank) Date Medicare IM given:   Medicare IM given by:   Date Additional Medicare IM given:   Additional Medicare IM given by:    Discharge Disposition:    Per UR Regulation:  Reviewed for med. necessity/level of care/duration of stay  If discussed at [place] of Stay Meetings, dates discussed:    Comments:  Suanne Marker Resa Rinks,RN,BSN,CCM

## 2014-01-16 NOTE — Progress Notes (Signed)
[facility] hospitalist progress note. Chief complaint. Transfer note. History of present illness. This 68 year old male presented to [facility] long emergency room with shortness of breath and chest pain. He was noted to be in acute decompensated congestive heart failure as well as possible pneumonia. He was felt to require transfer to [facility] and the patient is now arrived in transfer and I seeing him at bedside to ensure he remained stable and that his orders transferred properly. The patient has no complaints currently. Vital signs. Temperature 98.5, pulse 71, respirations 17, blood pressure 155/78. O2 sats 98%. General appearance. Well-developed elderly male who is alert and in no distress. Cardiac. Rate and rhythm regular. Lungs. Breath sounds are somewhat reduced in the bases but clear otherwise. O2 sats are stable. Abdomen. Soft with positive bowel sounds. No pain with palpation. Impression/plan. Problem #1. Acute decompensated congestive heart failure. Patient initially presented in SVT with hypotension. An emergent cardioversion which have proved effective. Have an elevated pro BNP and his chest x-ray suggested congestive heart failure. We'll follow for order d-dimer and TSH. Also echocardiogram. Problem 2. Possible pneumonia. This leukocytosis and dyspnea. Patient started on broad spectrum antibiotics. Problem #3. Mild acute kidney injury.. Patient with IV fluids for hydration and repeat metabolic panel to follow. Patient appears clinically stable post transfer. All orders appear to of transferred appropriately.

## 2014-01-16 NOTE — Consult Note (Signed)
Reason for Consult:SVT  Referring Physician: Ludwig Tugwell is an 68 y.o. male.   HPI:  The patient is a 68 yo man with history of COPD, asbestosis, depression, and reflex sympathetic dystrophy of LLE presented last night with chest pain, dyspnea, and palpitations. Patient states that he has had episodes where his heart will race briefly for years. He has been told in the past that this was due to "nerves." Last night around 8 pm, he heart started pounding. He developed chest tightness radiating to the left arm as well as significant dyspnea. He called his son over and they tried to wait it out. Symptoms continued, and he finally came to the ER around midnight. At that time, he was noted to be in SVT with HR in 150s. SBP was in the 90s. He was emergently cardioverted to NSR in the ER. Chest pain and dyspnea resolved. He is now in NSR. No chest pain or dyspnea, feels like he's basically back to normal. Troponin was noted to be up to 2. CXR showed chronic interstitial lung disease with ?superimposed PNA versus edema. Feet noted to look purple today. Patient says they are "always like this" from his reflex sympathetic dystrophy. He has never had syncope from his SVT but has been on medical therapy with beta blockers and has had multiple episodes over the past years.      PMH: Past Medical History  Diagnosis Date  . Arthritis   . SOB (shortness of breath)   . Pulmonary asbestosis   . Hernia of unspecified site of abdominal cavity without mention of obstruction or gangrene     hiatal  . GERD (gastroesophageal reflux disease)   . Depression   . Diverticulitis     hospital 2011 [facility]  . Diverticulosis   . Reflex sympathetic dystrophy   . 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 Hypertension   . IBS (irritable bowel syndrome)     PSHX: Past Surgical History  Procedure Laterality Date  . Nissen fundoplication    . Right elbow surgery      x 2  . Right knee arthroscopy    . Left shoulder       x 3   . Squamous cell skin cancer      Left Hand  . Cataract extraction      FAMHX: Family History  Problem Relation Age of Onset  . Colon cancer Mother   . Colon cancer Maternal Aunt   . Colon cancer Maternal Uncle     Social History:  reports that he quit smoking about 8 years ago. His smoking use included Cigarettes. He has a 125 pack-year smoking history. He quit smokeless tobacco use about 20 months ago. He reports that he does not drink alcohol or use illicit drugs.  Allergies:  Allergies  Allergen Reactions  . Codeine Itching    Medications: reviewed  Dg Chest Portable 1 View  01/16/2014   CLINICAL DATA:  Chest pain and shortness of breath.  EXAM: PORTABLE CHEST - 1 VIEW  COMPARISON:  Chest radiograph performed 09/05/2013  FINDINGS: The lungs are well-aerated. Interstitial opacities are again noted, with a peripheral predominance. This appears worsened from the prior study, with underlying vascular congestion. This may reflect pneumonia or pulmonary edema, superimposed on the patient's chronic interstitial lung disease. There is no evidence of pleural effusion or pneumothorax.  The cardiomediastinal silhouette is mildly enlarged. An external pacing pad is noted. No acute osseous abnormalities are seen.  IMPRESSION: Interstitial opacities are  worsened from the prior study, with underlying vascular congestion and mild cardiomegaly. This may reflect pulmonary edema or pneumonia, superimposed on the patient's chronic interstitial lung disease.   Electronically Signed   By: Garald Balding M.D.   On: 01/16/2014 02:14    ROS  As stated in the HPI and negative for all other systems.  Physical Exam  Vitals:Blood pressure 118/59, pulse 80, temperature 97.9 F (36.6 C), temperature source Oral, resp. rate 19, height 6' (1.829 m), weight 198 lb 10.2 oz (90.1 kg), SpO2 95.00%.  Well appearing man, somewhat diskempt NAD HEENT: Unremarkable Neck:  No JVD, no thyromegally Back:  No CVA  tenderness Lungs:  Clear with no wheezes HEART:  Regular rate rhythm, no murmurs, no rubs, no clicks Abd:  Flat, positive bowel sounds, no organomegally, no rebound, no guarding Ext:  2 plus pulses, no edema, no cyanosis, no clubbing Skin:  No rashes no nodules Neuro:  CN II through XII intact, motor grossly intact  Tele - nsr with episodes of SVT  Assessment/Plan: 1. Incessant SVT 2. Elevated cardiac enzymes, unclear but likely supply demand mismatch in the setting of prolonged SVT.  Plan: I have discussed the treatment options with the patient. The risk, goals, benefits, and expectations of catheter ablation have been reviewed, and he wishes to proceed.  Carleene Overlie TaylorMD 01/16/2014, 12:03 PM

## 2014-01-16 NOTE — Progress Notes (Signed)
40fr rt ij sheath removed from pt by sally groin Rn.  Direct pressure held for 10 minutes with complete hemostasis to the access site.  Tegaderm applied to site and pt instructions applied given. Pt verbalized understanding of instructions.

## 2014-01-16 NOTE — ED Notes (Signed)
Patient is from home brought in by son. Patient c/o chest pain and shortness of breath on arrival. Patient reported to sone that this all started around 1900 in the evening. Patient called his son around 0000 stating that his breathing was worst. Patient was found to have HR in 150-160's on arrival.

## 2014-01-16 NOTE — Progress Notes (Signed)
3 rt femoral veinous sheaths removed rfv by sally goins rn.  Direct pressure applied for 15 minutes with hemostasis.  Area soft without bleeding or hematoma, distal pt easily palpable. Pt insturctions given and pt verbalized understanding of instructions.

## 2014-01-16 NOTE — ED Provider Notes (Signed)
CSN: [id number]     Arrival date & time 01/16/14  0118 History   First MD Initiated Contact with Patient 01/16/14 0148     Chief Complaint  Patient presents with  . Shortness of Breath     (Consider location/radiation/quality/duration/timing/severity/associated sxs/prior Treatment) HPI Curtis [name] is a 68 y.o. male with no significant cardiac history presenting today with chest pain and shortness of breath. Patient states he had pressure and tight chest pain this evening while at rest. Associated with shortness of breath. He then had the sudden onset of palpitations. He called his son to describe his symptoms and call 911 to transfer to the ER. Patient's history is limited by the daily his condition, however he states this has never occurred to him in the past. He denies a history of ACS or CHF.  10 Systems reviewed and are negative for acute change except as noted in the HPI.     Past Medical History  Diagnosis Date  . Arthritis   . SOB (shortness of breath)   . Pulmonary asbestosis   . Hernia of unspecified site of abdominal cavity without mention of obstruction or gangrene     hiatal  . GERD (gastroesophageal reflux disease)   . Depression   . Diverticulitis     hospital 2011 [facility]  . Diverticulosis   . Reflex sympathetic dystrophy   . 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 Hypertension   . IBS (irritable bowel syndrome)    Past Surgical History  Procedure Laterality Date  . Nissen fundoplication    . Right elbow surgery      x 2  . Right knee arthroscopy    . Left shoulder       x 3  . Squamous cell skin cancer      Left Hand  . Cataract extraction     Family History  Problem Relation Age of Onset  . Colon cancer Mother   . Colon cancer Maternal Aunt   . Colon cancer Maternal Uncle    History  Substance Use Topics  . Smoking status: Former Smoker -- 2.50 packs/day for 50 years    Types: Cigarettes    Quit date: 05/05/2005  . Smokeless tobacco: Former Systems developer    Quit date:  05/03/2012  . Alcohol Use: No     Comment: last use 2013    Review of Systems    Allergies  Codeine  Home Medications   Prior to Admission medications   Medication Sig Start Date End Date Taking? Authorizing Provider  alprazolam [name]) 2 MG tablet Take 2 mg by mouth 2 (two) times daily.   Yes Historical Provider, MD  aspirin 325 MG tablet Take 1 tablet (325 mg total) by mouth daily. 09/08/13  Yes [name], MD  docusate sodium (COLACE) 100 MG capsule Take 100 mg by mouth 2 (two) times daily as needed for mild constipation.   Yes Historical Provider, MD  finasteride (PROSCAR) 5 MG tablet Take 5 mg by mouth every morning.    Yes Historical Provider, MD  gabapentin (NEURONTIN) 300 MG capsule Take 300 mg by mouth 3 (three) times daily.    Yes Historical Provider, MD  losartan (COZAAR) 100 MG tablet Take 1 tablet (100 mg total) by mouth daily. 09/08/13  Yes [name], MD  metoprolol (LOPRESSOR) 50 MG tablet Take 50 mg by mouth 2 (two) times daily.    Yes Historical Provider, MD  nortriptyline (PAMELOR) 50 MG capsule Take 50 mg by mouth at  bedtime.   Yes Historical Provider, MD  omeprazole (PRILOSEC) 20 MG capsule Take 20 mg by mouth daily.   Yes Historical Provider, MD  simvastatin (ZOCOR) 20 MG tablet Take 1 tablet (20 mg total) by mouth at bedtime. 09/08/13  Yes [name], MD  tamsulosin (FLOMAX) 0.4 MG CAPS capsule Take 1 capsule (0.4 mg total) by mouth daily. 09/08/13  Yes Ripudeep K Rai, MD   BP 155/78  Pulse 71  Temp(Src) 98.5 F (36.9 C) (Oral)  Resp 17  Ht 6' (1.829 m)  Wt 198 lb 10.2 oz (90.1 kg)  BMI 26.93 kg/m2  SpO2 98% Physical Exam  Nursing note and vitals reviewed. Constitutional: He is oriented to person, place, and time. Vital signs are normal. He appears well-developed and well-nourished.  Non-toxic appearance. He does not appear ill. He appears distressed.  HENT:  Head: Normocephalic and atraumatic.  Nose: Nose normal.  Mouth/Throat: Oropharynx is  clear and moist. No oropharyngeal exudate.  . Oral cyanosis was present  Eyes: Conjunctivae and EOM are normal. Pupils are equal, round, and reactive to light. No scleral icterus.  Neck: Normal range of motion. Neck supple. No tracheal deviation, no edema, no erythema and normal range of motion present. No mass and no thyromegaly present.  Cardiovascular: Regular rhythm, S1 normal, S2 normal, normal heart sounds, intact distal pulses and normal pulses.  Exam reveals no gallop and no friction rub.   No murmur heard. Pulses:      Radial pulses are 2+ on the right side, and 2+ on the left side.       Dorsalis pedis pulses are 2+ on the right side, and 2+ on the left side.  Tachycardia noted.  Pulmonary/Chest: Effort normal and breath sounds normal. No respiratory distress. He has no wheezes. He has no rhonchi. He has no rales.  Tachypnea  Abdominal: Soft. Normal appearance and bowel sounds are normal. He exhibits no distension, no ascites and no mass. There is no hepatosplenomegaly. There is no tenderness. There is no rebound, no guarding and no CVA tenderness.  Musculoskeletal: Normal range of motion. He exhibits no edema and no tenderness.  Lymphadenopathy:    He has no cervical adenopathy.  Neurological: He is alert and oriented to person, place, and time. He has normal strength. No cranial nerve deficit or sensory deficit. He exhibits normal muscle tone. GCS eye subscore is 4. GCS verbal subscore is 5. GCS motor subscore is 6.  Skin: Skin is warm, dry and intact. No petechiae and no rash noted. He is not diaphoretic. No erythema. No pallor.  Psychiatric: He has a normal mood and affect. His behavior is normal. Judgment normal.    ED Course  CARDIOVERSION Date/Time: 01/16/2014 7:22 AM Performed by: Everlene Balls Authorized by: Everlene Balls Consent: Verbal consent obtained. The procedure was performed in an emergent situation. Risks and benefits: risks, benefits and alternatives were  discussed Consent given by: patient Patient understanding: patient states understanding of the procedure being performed Test results: test results available and properly labeled Site marked: the operative site was marked Imaging studies: imaging studies not available Patient identity confirmed: verbally with patient, arm band and hospital-assigned identification number Time out: Immediately prior to procedure a "time out" was called to verify the correct patient, procedure, equipment, support staff and site/side marked as required. Patient sedated: yes Sedation type: moderate (conscious) sedation Sedatives: ketamine Vitals: Vital signs were monitored during sedation. Cardioversion basis: emergent Pre-procedure rhythm: supraventricular tachycardia Chest area: chest area exposed  Electrodes: pads Electrodes placed: anterior-posterior Number of attempts: 1 Attempt 1 mode: synchronous Attempt 1 shock (in Joules): 150 Attempt 1 outcome: conversion to normal sinus rhythm Post-procedure rhythm: normal sinus rhythm Complications: no complications Patient tolerance: Patient tolerated the procedure well with no immediate complications.   (including critical care time) Labs Review Labs Reviewed  CBC WITH DIFFERENTIAL - Abnormal; Notable for the following:    WBC 13.4 (*)    Neutro Abs 10.0 (*)    Monocytes Absolute 1.3 (*)    All other components within normal limits  COMPREHENSIVE METABOLIC PANEL - Abnormal; Notable for the following:    Sodium 136 (*)    Chloride 95 (*)    Glucose, Bld 145 (*)    Creatinine, Ser 1.60 (*)    Calcium 11.1 (*)    Total Protein 8.8 (*)    GFR calc non Af Amer 42 (*)    GFR calc Af Amer 49 (*)    All other components within normal limits  PRO B NATRIURETIC PEPTIDE - Abnormal; Notable for the following:    Pro B Natriuretic peptide (BNP) 2047.0 (*)    All other components within normal limits  TROPONIN I - Abnormal; Notable for the following:     Troponin I 2.04 (*)    All other components within normal limits  I-STAT TROPOININ, ED - Abnormal; Notable for the following:    Troponin i, poc 0.21 (*)    All other components within normal limits  I-STAT CG4 LACTIC ACID, ED - Abnormal; Notable for the following:    Lactic Acid, Venous 3.44 (*)    All other components within normal limits  CULTURE, BLOOD (ROUTINE X 2)  CULTURE, BLOOD (ROUTINE X 2)  MRSA PCR SCREENING  PROTIME-INR  URINALYSIS, ROUTINE W REFLEX MICROSCOPIC  MAGNESIUM  D-DIMER, QUANTITATIVE  TSH    Imaging Review Dg Chest Portable 1 View  01/16/2014   CLINICAL DATA:  Chest pain and shortness of breath.  EXAM: PORTABLE CHEST - 1 VIEW  COMPARISON:  Chest radiograph performed 09/05/2013  FINDINGS: The lungs are well-aerated. Interstitial opacities are again noted, with a peripheral predominance. This appears worsened from the prior study, with underlying vascular congestion. This may reflect pneumonia or pulmonary edema, superimposed on the patient's chronic interstitial lung disease. There is no evidence of pleural effusion or pneumothorax.  The cardiomediastinal silhouette is mildly enlarged. An external pacing pad is noted. No acute osseous abnormalities are seen.  IMPRESSION: Interstitial opacities are worsened from the prior study, with underlying vascular congestion and mild cardiomegaly. This may reflect pulmonary edema or pneumonia, superimposed on the patient's chronic interstitial lung disease.   Electronically Signed   By: Garald Balding M.D.   On: 01/16/2014 02:14     EKG Interpretation None      MDM   Final diagnoses:  Hypervolemia, unspecified hypervolemia type  Community acquired pneumonia    Patient returns to emergency department out of concern for shortness of breath and palpitations. Upon arrival he was found to have a heart rate in the 150s, and perioral cyanosis. Systolic blood pressures in the 90s. Patient is complaining of ischemic chest pain  shortness of breath. The decision was made to cardiovert the patient. He was sedated with ketamine and converted on the first attempt. Repeat systolic blood pressure was 140s and perioral cyanosis resolved at that time.  Unsure the etiology of patient's SVT. He appears to be volume overloaded with effusions on chest x-ray. He was initially given a  liter of IV fluids due to hypotension, and was subsequently given 40 of IV Lasix in conjunction with the hospitalist. Chest x-ray also revealed pneumonia, he was given ceftriaxone azithromycin. He'll be retained in the hospital for continued care.     Procedural sedation Performed by: Everlene Balls Consent: Verbal consent obtained. Risks and benefits: risks, benefits and alternatives were discussed Required items: required blood products, implants, devices, and special equipment available Patient identity confirmed: arm band and provided demographic data Time out: Immediately prior to procedure a "time out" was called to verify the correct patient, procedure, equipment, support staff and site/side marked as required.  Sedation type: moderate (conscious) sedation NPO time confirmed and considedered  Sedatives: [place]   Physician Time at Bedside: 38min  Vitals: Vital signs were monitored during sedation. Cardiac Monitor, pulse oximeter Patient tolerance: Patient tolerated the procedure well with no immediate complications. Comments: Pt with uneventful recovered. Returned to pre-procedural sedation baseline   CRITICAL CARE Performed by: Everlene Balls   Total critical care time: 17min  Critical care time was exclusive of separately billable procedures and treating other patients.  Critical care was necessary to treat or prevent imminent or life-threatening deterioration.  Critical care was time spent personally by me on the following activities: development of treatment plan with patient and/or surrogate as well as nursing, discussions with  consultants, evaluation of patient's response to treatment, examination of patient, obtaining history from patient or surrogate, ordering and performing treatments and interventions, ordering and review of laboratory studies, ordering and review of radiographic studies, pulse oximetry and re-evaluation of patient's condition.      Everlene Balls, MD 01/16/14 [phone]

## 2014-01-16 NOTE — Progress Notes (Signed)
[name]  Curtis [name] PJA:[id number] DOB: 05-Dec-1945 DOA: 01/16/2014 PCP: [name], MD   Brief narrative: 68 year old male patient with multiple medical problems including hypertension, diastolic dysfunction, COPD with pulmonary asbestosis and 2 tiny nonruptured cerebral aneurysms. The patient presented to the ER initially because of complaints of shortness of breath and chest pain that began less than 24 hours prior to presentation. He reported his shortness of breath was associated with cough and nausea. During the episode of shortness of breath he began having numbness in his left arm and chest pressure and apparent tachypalpitations. Patient and son waited for a period of time to see if the symptoms would improve but because they did not he presented to the emergency department.  The patient arrived via EMS to the emergency department and was found to be in SVT with rates in the 150s. In addition to the chest pain and shortness of breath he was also dizzy. In the ER he underwent urgent cardioversion and was subsequently converted to sinus rhythm with improvement in symptoms. Of note patient was last hospitalized in may of this year due to orthostatic hypotension related to diuretics which were subsequently discontinued at time of discharge. Patient's labs revealed mild renal insufficiency and on mildly elevated calcium of 11.1 which was higher than discharge calcium of 9.1. Patient's POC TNI was mildly elevated at 0.21. Chest x-ray performed at admission showed interstitial opacities worsened from prior study with underlying vascular congestion concerning for pulmonary edema or possibly pneumonia superimposed on the patient's chronic interstitial lung disease.  Since admission patient's respiratory symptoms have improved markedly with rate control and use of diuretic. Lactic acid was elevated at presentation at 3.44 concerning with hypoperfusion  that may be related to SVT and/or dehydration. Patient had only diuresed about 425 cc out since admission and suspect symptoms were primarily related to SVT and not to heart failure. Lasix was not resumed at admission. Patient has been evaluated by EP and will be undergoing cardiac catheterization today. Concerns patient may require ablation procedure as well  HPI/Subjective: Endorses marked improvement in respiratory symptoms and no further tachycardia palpitations. Joking with the MD asking him to get him a bottle of wine.  Assessment/Plan:    AKI (acute kidney injury) -Related to poor perfusion in setting of SVT-possibly could still have some persistent hypovolemia given presentation with mild hypercalcemia-follow labs     Acute decompensated heart failure -Likely transient and related to SVT as opposed to true heart failure-Lasix not continued at admission    SVT (supraventricular tachycardia) -Currently resolved post cardioversion in the ER-cardiology has initiated Toprol-XL or for improved rate control-post EP study with catheter ablation today    Elevated troponin -Likely secondary to SVT prior to presentation-cardiac cath as above-echocardiogram pending-on beta blocker as above-continue statin and aspirin    Acute respiratory failure with hypoxia/  Asbestosis/COPD/? Community acquired pneumonia -No active wheezing on exam and suspect respiratory symptoms were transient and related to SVT-question of infiltrates so has been begun on community-acquired pneumonia antibiotics-afebrile but did have mild leukocytosis at presentation    HTN (hypertension) -Blood pressure currently controlled-continue Norvasc and Toprol    TOBACCO ABUSE, hx    REFLEX SYMPATHETIC DYSTROPHY -Continue Neurontin    GERD -Continue PPI    Recent Orthostatic hypotension -Noted with mild renal insufficiency and hypercalcemia at presentation and may have worsened after the addition of diuretics at  presentation-follow renal function and administer IV fluids  if indicated    Two small Nonruptured cerebral aneurysm -Currently without any clinical symptoms to suggest active bleeding  DVT prophylaxis: Order either SCDs or appropriate pharmacological prophylaxis post cardiac catheterization Code Status: Full Family Communication: No family at bedside Disposition Plan/Expected LOS: Stepdown  Consultants: Cardiology EP  Procedures: Lower extremity arterial duplex pending Electrophysiology study with catheter ablation of AVN reentry tachycardia 2-D echocardiogram pending  Cultures: 9/14 blood cultures x2 pending  Antibiotics: Zithromax 9/13 >>> Rocephin 9/13 >>>  Objective: Blood pressure 148/80, pulse 73, temperature 97.9 F (36.6 C), temperature source Oral, resp. rate 14, height 6' (1.829 m), weight 90.1 kg (198 lb 10.2 oz), SpO2 97.00%.  Intake/Output Summary (Last 24 hours) at 01/16/14 1859 Last data filed at 01/16/14 1200  Gross per 24 hour  Intake  158.5 ml  Output    825 ml  Net -666.5 ml   Exam: Followup exam completed-patient admitted at 5:05 AM today  Scheduled Meds:  Scheduled Meds: . amLODipine  5 mg Oral Daily  . antiseptic oral rinse  7 mL Mouth Rinse BID  . [START ON 01/18/2014] aspirin  325 mg Oral Daily  . atorvastatin  80 mg Oral q1800  . [START ON 01/17/2014] azithromycin  500 mg Intravenous Q24H  . [START ON 01/17/2014] cefTRIAXone (ROCEPHIN)  IV  1 g Intravenous Q24H  . [START ON 01/17/2014] enoxaparin (LOVENOX) injection  40 mg Subcutaneous Q24H  . finasteride  5 mg Oral Daily  . gabapentin  300 mg Oral TID  . metoprolol succinate  50 mg Oral BID  . nortriptyline  50 mg Oral QHS  . pantoprazole  40 mg Oral Daily  . [START ON 01/17/2014] pneumococcal 23 valent vaccine  0.5 mL Intramuscular Tomorrow-1000  . sodium chloride  3 mL Intravenous Q12H  . sodium chloride  3 mL Intravenous Q12H  . tamsulosin  0.4 mg Oral Daily   Data Reviewed: Basic  Metabolic Panel:  Recent Labs Lab 01/16/14 0303  NA 136*  K 4.7  CL 95*  CO2 26  GLUCOSE 145*  BUN 19  CREATININE 1.60*  CALCIUM 11.1*  MG 1.9   Liver Function Tests:  Recent Labs Lab 01/16/14 0303  AST 23  ALT 19  ALKPHOS 79  BILITOT 0.3  PROT 8.8*  ALBUMIN 4.0   CBC:  Recent Labs Lab 01/16/14 0303  WBC 13.4*  NEUTROABS 10.0*  HGB 13.6  HCT 41.2  MCV 89.2  PLT 396   Cardiac Enzymes:  Recent Labs Lab 01/16/14 0453 01/16/14 1205  TROPONINI 2.04* 2.76*   BNP (last 3 results)  Recent Labs  01/16/14 0237  PROBNP 2047.0*    Recent Results (from the past 240 hour(s))  MRSA PCR SCREENING     Status: None   Collection Time    01/16/14  6:41 AM      Result Value Ref Range Status   MRSA by PCR NEGATIVE  NEGATIVE Final   Comment:            The GeneXpert MRSA Assay (FDA     approved for NASAL specimens     only), is one component of a     comprehensive MRSA colonization     surveillance program. It is not     intended to diagnose MRSA     infection nor to guide or     monitor treatment for     MRSA infections.     Studies:  Recent x-ray studies have been reviewed in detail by the  Attending Physician  Time spent :  25+ mins      Erin Hearing, ANP [facility] Office  [phone] Pager [phone]   **If unable to reach the above provider after paging please contact the [place] @ [phone]  On-Call/Text Page:      [email or web address]      password [email or web address]  If 7PM-7AM, please contact night-coverage [email or web address] Password [email or web address] 01/16/2014, 6:59 PM   LOS: 0 days   I have personally examined this patient and reviewed the entire database. I have reviewed the above note, made any necessary editorial changes, and agree with its content.  Cherene Altes, MD [facility]

## 2014-01-17 ENCOUNTER — Encounter (HOSPITAL_COMMUNITY)
Admission: EM | Disposition: A | Discharge status: Home / Self Care | Payer: Medicare Other | Source: Home / Self Care | Attending: Internal Medicine

## 2014-01-17 DIAGNOSIS — I739 Peripheral vascular disease, unspecified: Secondary | ICD-10-CM

## 2014-01-17 DIAGNOSIS — Z955 Presence of coronary angioplasty implant and graft: Secondary | ICD-10-CM

## 2014-01-17 DIAGNOSIS — R0989 Other specified symptoms and signs involving the circulatory and respiratory systems: Secondary | ICD-10-CM

## 2014-01-17 DIAGNOSIS — I2510 Atherosclerotic heart disease of native coronary artery without angina pectoris: Secondary | ICD-10-CM

## 2014-01-17 HISTORY — PX: LEFT HEART CATHETERIZATION WITH CORONARY ANGIOGRAM: SHX5451

## 2014-01-17 LAB — BASIC METABOLIC PANEL
Anion gap: 12 (ref 5–15)
BUN: 19 mg/dL (ref 6–23)
CALCIUM: 9.5 mg/dL (ref 8.4–10.5)
CHLORIDE: 99 meq/L (ref 96–112)
CO2: 28 mEq/L (ref 19–32)
CREATININE: 1.3 mg/dL (ref 0.50–1.35)
GFR calc non Af Amer: 54 mL/min — ABNORMAL LOW (ref >90)
GFR, EST AFRICAN AMERICAN: 63 mL/min — AB (ref >90)
Glucose, Bld: 123 mg/dL — ABNORMAL HIGH (ref 70–99)
Potassium: 4 mEq/L (ref 3.7–5.3)
Sodium: 139 mEq/L (ref 137–147)

## 2014-01-17 LAB — CBC
HEMATOCRIT: 32.6 % — AB (ref 39.0–52.0)
Hemoglobin: 10.8 g/dL — ABNORMAL LOW (ref 13.0–17.0)
MCH: 29.3 pg (ref 26.0–34.0)
MCHC: 33.1 g/dL (ref 30.0–36.0)
MCV: 88.6 fL (ref 78.0–100.0)
Platelets: 253 10*3/uL (ref 150–400)
RBC: 3.68 MIL/uL — ABNORMAL LOW (ref 4.22–5.81)
RDW: 14.6 % (ref 11.5–15.5)
WBC: 6.9 10*3/uL (ref 4.0–10.5)

## 2014-01-17 LAB — POCT ACTIVATED CLOTTING TIME: Activated Clotting Time: 405 seconds

## 2014-01-17 LAB — TROPONIN I: Troponin I: 3.48 ng/mL (ref <0.30)

## 2014-01-17 SURGERY — LEFT HEART CATHETERIZATION WITH CORONARY ANGIOGRAM
Anesthesia: LOCAL

## 2014-01-17 MED ORDER — HEPARIN SODIUM (PORCINE) 1000 UNIT/ML IJ SOLN
INTRAMUSCULAR | Status: AC
  Filled 2014-01-17: qty 1

## 2014-01-17 MED ORDER — LABETALOL HCL 5 MG/ML IV SOLN
INTRAVENOUS | Status: AC
  Filled 2014-01-17: qty 4

## 2014-01-17 MED ORDER — ATROPINE SULFATE 0.1 MG/ML IJ SOLN
INTRAMUSCULAR | Status: DC
Start: 2014-01-17 — End: 2014-01-17
  Filled 2014-01-17: qty 10

## 2014-01-17 MED ORDER — LIDOCAINE HCL (PF) 1 % IJ SOLN
INTRAMUSCULAR | Status: AC
  Filled 2014-01-17: qty 30

## 2014-01-17 MED ORDER — FENTANYL CITRATE 0.05 MG/ML IJ SOLN
INTRAMUSCULAR | Status: AC
  Filled 2014-01-17: qty 2

## 2014-01-17 MED ORDER — ALPRAZOLAM 0.5 MG PO TABS
2.0000 mg | ORAL_TABLET | Freq: Four times a day (QID) | ORAL | Status: DC | PRN
  Administered 2014-01-17 – 2014-01-20 (×5): 2 mg via ORAL
  Filled 2014-01-17 (×5): qty 4

## 2014-01-17 MED ORDER — SODIUM CHLORIDE 0.9 % IJ SOLN: 3.0000 mL | INTRAMUSCULAR | Status: DC | PRN

## 2014-01-17 MED ORDER — MIDAZOLAM HCL 2 MG/2ML IJ SOLN
INTRAMUSCULAR | Status: AC
  Filled 2014-01-17: qty 2

## 2014-01-17 MED ORDER — BETHANECHOL CHLORIDE 10 MG PO TABS
10.0000 mg | ORAL_TABLET | Freq: Four times a day (QID) | ORAL | Status: AC
  Administered 2014-01-17 – 2014-01-18 (×6): 10 mg via ORAL
  Filled 2014-01-17 (×8): qty 1

## 2014-01-17 MED ORDER — BIVALIRUDIN 250 MG IV SOLR
INTRAVENOUS | Status: AC
  Filled 2014-01-17: qty 250

## 2014-01-17 MED ORDER — BETHANECHOL CHLORIDE 10 MG PO TABS: 10.0000 mg | ORAL_TABLET | Freq: Four times a day (QID) | ORAL | Status: DC

## 2014-01-17 MED ORDER — METOPROLOL SUCCINATE ER 50 MG PO TB24
50.0000 mg | ORAL_TABLET | Freq: Two times a day (BID) | ORAL | Status: DC
  Administered 2014-01-17 (×2): 50 mg via ORAL
  Filled 2014-01-17 (×5): qty 1

## 2014-01-17 MED ORDER — LABETALOL HCL 5 MG/ML IV SOLN
20.0000 mg | Freq: Once | INTRAVENOUS | Status: AC
  Administered 2014-01-17: 20:00:00 20 mg via INTRAVENOUS
  Filled 2014-01-17: qty 4

## 2014-01-17 MED ORDER — CLONIDINE HCL 0.1 MG PO TABS
0.1000 mg | ORAL_TABLET | Freq: Three times a day (TID) | ORAL | Status: DC
  Administered 2014-01-17 (×2): 0.1 mg via ORAL
  Filled 2014-01-17 (×6): qty 1

## 2014-01-17 MED ORDER — TICAGRELOR 90 MG PO TABS
90.0000 mg | ORAL_TABLET | Freq: Two times a day (BID) | ORAL | Status: DC
  Administered 2014-01-18 – 2014-01-20 (×6): 90 mg via ORAL
  Filled 2014-01-17 (×7): qty 1

## 2014-01-17 MED ORDER — HYDRALAZINE HCL 20 MG/ML IJ SOLN
10.0000 mg | Freq: Four times a day (QID) | INTRAMUSCULAR | Status: DC | PRN
  Administered 2014-01-17: 16:00:00 10 mg via INTRAVENOUS
  Filled 2014-01-17: qty 1

## 2014-01-17 MED ORDER — SODIUM CHLORIDE 0.9 % IV SOLN: 250.0000 mL | INTRAVENOUS | Status: DC | PRN

## 2014-01-17 MED ORDER — HEPARIN (PORCINE) IN NACL 2-0.9 UNIT/ML-% IJ SOLN
INTRAMUSCULAR | Status: AC
  Filled 2014-01-17: qty 1500

## 2014-01-17 MED ORDER — AMLODIPINE BESYLATE 10 MG PO TABS
10.0000 mg | ORAL_TABLET | Freq: Every day | ORAL | Status: DC
  Filled 2014-01-17: qty 1

## 2014-01-17 MED ORDER — NITROGLYCERIN 1 MG/10 ML FOR IR/CATH LAB
INTRA_ARTERIAL | Status: AC
  Filled 2014-01-17: qty 10

## 2014-01-17 MED ORDER — SODIUM CHLORIDE 0.9 % IJ SOLN
3.0000 mL | Freq: Two times a day (BID) | INTRAMUSCULAR | Status: DC
Start: 2014-01-17 — End: 2014-01-17

## 2014-01-17 MED ORDER — ASPIRIN 81 MG PO CHEW: 81.0000 mg | CHEWABLE_TABLET | ORAL | Status: DC

## 2014-01-17 MED ORDER — VERAPAMIL HCL 2.5 MG/ML IV SOLN
INTRAVENOUS | Status: AC
  Filled 2014-01-17: qty 2

## 2014-01-17 MED ORDER — SODIUM CHLORIDE 0.9 % IV SOLN
INTRAVENOUS | Status: DC
  Administered 2014-01-17: 08:00:00 via INTRAVENOUS

## 2014-01-17 MED ORDER — LABETALOL HCL 5 MG/ML IV SOLN
10.0000 mg | INTRAVENOUS | Status: DC | PRN
Start: 2014-01-17 — End: 2014-01-18

## 2014-01-17 MED ORDER — TICAGRELOR 90 MG PO TABS
ORAL_TABLET | ORAL | Status: AC
  Filled 2014-01-17: qty 2

## 2014-01-17 NOTE — Progress Notes (Signed)
Site area: right groin  Site Prior to Removal:  Level 0  Pressure Applied For 20 MINUTES    Minutes Beginning at 2130  Manual:   Yes.    Patient Status During Pull:  stable  Post Pull Groin Site:  Level 0  Post Pull Instructions Given:  Yes.    Post Pull Pulses Present:  Yes.    Dressing Applied:  Yes.    Comments:

## 2014-01-17 NOTE — Interval H&P Note (Signed)
History and Physical Interval Note:  01/17/2014 1:11 PM  Curtis [name]  has presented today for surgery, with the diagnosis of + Troponin - Unclear if NSTEMI vs. Demand Ishcemia/Infarction in setting of SVT. The various methods of treatment have been discussed with the patient and family. After consideration of risks, benefits and other options for treatment, the patient has consented to  Procedure(s): LEFT HEART CATHETERIZATION WITH CORONARY ANGIOGRAM (N/A) as a surgical intervention .  The patient's history has been reviewed, patient examined, no change in status, stable for surgery.  I have reviewed the patient's chart and labs.  Questions were answered to the patient's satisfaction.     Curtis [name]  Cath Lab Visit (complete for each Cath Lab visit)  Clinical Evaluation Leading to the Procedure:   ACS: Yes.    Non-ACS:    Anginal Classification: CCS IV  Anti-ischemic medical therapy: Maximal Therapy (2 or more classes of medications)  Non-Invasive Test Results: No non-invasive testing performed  Prior CABG: No previous CABG

## 2014-01-17 NOTE — CV Procedure (Signed)
CARDIAC CATHETERIZATION AND PERCUTANEOUS CORONARY INTERVENTION REPORT  NAME:  BRAXDON GAPPA   MRN: [id number] DOB:  Jul 27, 1945   ADMIT DATE: 01/16/2014 Procedure Date: 01/17/2014  INTERVENTIONAL CARDIOLOGIST: Leonie Man, M.D., MS PRIMARY CARE PROVIDER: [name], MD PRIMARY CARDIOLOGIST: Loralie Champagne, M.D.  PATIENT:  Curtis [name] is a 68 y.o. 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 Symptoms began abruptly at 8:00 in the evening when he developed chest tightness radiating to left arm with dyspnea. Upon arrival to the emergency room he was found to be in SVT with a heart rate in the 768T and systolic pressures in the 90s. He was emergently cardioverted into sinus rhythm in the ER. His chest pain quickly resolved. He has not had any further discomfort and is back to baseline. On further evaluation, it would appear that he has been having exertional anginal type symptoms for last several months. He underwent SVT ablation by Dr. Crissie Sickles on September 14. Based on his anginal-type symptoms in SVC history of exertional symptoms prior to this presentation, he it was unclear whether he was a acute coronary syndrome non-STEMI versus Demand Ischemia mediated  nfarct. In order to clarify and delineate his coronary anatomy he is referred for invasive coronary evaluation cardiac catheterization and possible PCI.  PRE-OPERATIVE DIAGNOSIS:    Symptomatic SVT  Positive Troponin: Non-STEMI versus Demand Ischemia Infarction  PROCEDURES PERFORMED:    Left Heart Catheterization with Native Coronary Angiography va combination Right Radial and Right Common Femoral  Artery   Percutaneous Coronary Intervention of the proximal to mid native circumflex and an 80% and 90% stenoses within one long Promus Premier  DES3.5  mm x 38 mm postdilated to 3.8-3.9 mg   PROCEDURE: The patient was brought to the [facility] in the fasting state and prepped and draped in the usual sterile fashion for  right radial  artery  and right femoral access. A modified Allen's test was performed on the  right  wrist demonstrating excellent collateral flow for radial access.   Sterile technique was used including antiseptics, cap, gloves, gown, hand hygiene, mask and sheet. Skin prep: Chlorhexidine.   Consent: Risks of procedure as well as the alternatives and risks of each were explained to the (patient/caregiver). Consent for procedure obtained.   Time Out: Verified patient identification, verified procedure, site/side was marked, verified correct patient position, special equipment/implants available, medications/allergies/relevent history reviewed, required imaging and test results available. Performed.  Access:   Right Radial Artery: 6  Fr Sheath -  Seldinger Technique (Angiocath Micropuncture Kit)  Radial Cocktail - 10 mL; IV Heparin 4500  Units    Right Common Femoral Artery:  5 Fr Sheath -  fluoroscopically guided modified Seldinger Technique  Left Heart Catheterization:   Radial approach: 5Fr Catheters advanced over a Versicore wire and exchanged over a Long-Exchange Safety  J-wire; TIG 4.0 catheter advanced first.  Right Coronary Artery Cineangiography: TIG 4.0 Catheter    LV Hemodynamics: TIG 4.0 Catheter Unable to engage the left coronary artery due to tortuosity of the innominate artery and aortic arch. At this point decision was made to abort radial access, placed TR Band in and proceed with femoral arterial access.   Femoral Approach: 5 Fr JL 4. 0 catheter advanced over standard J-wire.  Left Coronary Artery  Cineangiography: JL4 Catheter   Sheath removed in the Postprocedure Unit with manual pressure for hemostasis.  TR Band: 14  Hours;  1355 mL air prior to completing femoral  access   FINDINGS:  Hemodynamics:   Central Aortic Pressure / Mean:  145/78/107 mmHg  Left Ventricular Pressure / LVEDP:  142/9/16 mmHg  Left Ventriculography: deferred ; EF Was 55-60% by Echocardiogram  Coronary Anatomy:  Dominance:  right  Left Main:  large caliber mildly calcified vessel that bifurcates into the LAD and Left Circumflex. Angiographically normal. LAD:  large caliber wraparound vessel that gives rise to a proximal small to moderate D1 followed by a roughly 30-40% stenosis. There is a moderate caliber D2 with ostial 60% stenosis. The remainder of the LAD is relatively free of disease and wraps around the apex. There are septal collaterals that fill the entire Right Posterior Descending Artery (RPDA) direct grade fills the posterolateral system with 2 posterolateral branches.  D1:  small moderate caliber vessel with minimal luminal irregularities  D2: Moderate caliber vessel with ostial 60% stenosis and otherwise minimal luminal irregularities Left Circumflex: Very large caliber, nondominant vessel that gives rise to a proximal marginal branch followed by a roughly 80% stenosis in a long segment of 50-60% stenosis followed by 90% stenosis at the vessel courses into becoming a large lateral OM branch. This segment there is a second marginal branch in the AV groove vessel. The second marginal branch has an ostial involvement in the main circumflex disease but has TIMI-3 flow.   RCA: Vita Erm been a large caliber vessel that is 100% occluded essentially ostially.   RPDA: Fills via septal collaterals and from the apical LAD. Flow goes retrograde to the bifurcation into the Right Posterior AV Groove Artery that is rise to 2 posterolateral branches. Retrograde flow reveals a 90% stenosis just prior to this bifurcation in the native RCA and then essentially occlusion just upstream from that.   RPL Sysytem:The RPAV fills mostly via retrograde flow from the PDA, also does have some  collateral flow from the Circumflex AV groove branch.   After reviewing the initial angiography, the culprit lesion was thought to be the tandem Circumflex-OM 80% & 90% lesions (ith intervening ~50-60%).  Preparation were made to proceed with PCI on this as 1 complete lesion lesion.  Percutaneous Coronary Intervention:  Femoral Sheath exchanged for 6 Fr  Angiomax bolus and infusion was administered along with 180 mg oral Brilinta. An ACT was confirmed to be >250 Sec Guide: 6 Fr   XB LAD 3.5 Guidewire: BMW Predilation Balloon: Euphora 2.5 mm x 20 mm;   10 Atm x 25 Sec, at the more distal 90% stenosis Stent: [employer] DES 3.5 mm x 38 mm;   14 Atm x 30 Sec  Post-dilation Balloon: Quentin Euphora 3.75 mm x 27 mm;   Distal 14 Atm x 30 Sec,   Proximal 20 Atm x 30 Sec  Final Diameter: 3.9 mm proximal, 3.8 mm distal  Post deployment angiography in multiple views, with and without guidewire in place revealed excellent stent deployment and lesion coverage.  There was no evidence of dissection or perforation. Knee small to moderate caliber OM continued to have ostial 70% stenosis of the kidney 3 flow distally. Initial attempts were made to pass a wire into this vessel to dilate after stent placement however I was unable to pass the balloon through the stent at angle. As there was TIMI-3 flow,the decision was made to just simply treat medically.  MEDICATIONS:  Anesthesia:  Local Lidocaine 2 mL per radial access, a 14 mL for femoral  Sedation:  4 mg IV Versed, 100 mcg IV fentanyl ;   Omnipaque Contrast: 150 ml  Anticoagulation:  IV Heparin 4500 Units ;   Angiomax Bolus & drip  Anti-Platelet Agent:  Brilinta 180 mg  PATIENT DISPOSITION:    The patient was transferred to the PACU holding area in a hemodynamicaly stable, chest pain free condition.  The patient tolerated the procedure well, and there were no complications.  EBL:   < 20 ml  The patient was stable before,  during, and after the procedure.  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  Residual ostial 70% stenosis in small marginal branch jailed by stent  Normal mildly elevated LV pressures with known normal EF  PLAN OF CARE:  The patient will be transferred to the postprocedure unit for standard post catheterization care.  With a long DES stent he will need dual antiplatelet therapy for a minimum of one year. He was loaded with Brilinta which can be continued for at least the first month after which time he could be switched to Plavix.  He was hypertensive on the Cath Lab table, therefore we will provide IV labetalol when necessary for sheath removal as well as IV hydralazine. I have increased his amlodipine dose to 10 mg daily.  Provided he remains stable overnight, he should potentially be discharged in the morning to followup with Dr. Loralie Champagne and Dr. Crissie Sickles.    Leonie Man, M.D., M.S. Interventional Cardiologist   Pager # [phone]

## 2014-01-17 NOTE — Progress Notes (Signed)
TR BAND REMOVAL  LOCATION:    right radial  DEFLATED PER PROTOCOL:    Yes.    TIME BAND OFF / DRESSING APPLIED:    1900   SITE UPON ARRIVAL:    Level 0  SITE AFTER BAND REMOVAL:    Level 0  REVERSE ALLEN'S TEST:     positive  CIRCULATION SENSATION AND MOVEMENT:    Within Normal Limits   Yes.    COMMENTS:   Tolerated procedure well 

## 2014-01-17 NOTE — Progress Notes (Signed)
Curtis [name] - Stepdown / ICU Progress Note  KEATH MATERA KXF:[id number] DOB: 03-05-1946 DOA: 01/16/2014   PCP: [name], MD  Brief narrative: 68 year old male patient with multiple medical problems including hypertension, diastolic dysfunction, COPD with pulmonary asbestosis and 2 tiny nonruptured cerebral aneurysms. The patient presented to the ER initially because of complaints of shortness of breath and chest pain that began less than 24 hours prior to presentation. He reported his shortness of breath was associated with cough and nausea. During the episode of shortness of breath he began having numbness in his left arm and chest pressure and apparent tachypalpitations. Patient and son waited for a period of time to see if the symptoms would improve but because they did not he presented to the emergency department.  The patient arrived via EMS to the emergency department and was found to be in SVT with rates in the 150s. In addition to the chest pain and shortness of breath he was also dizzy. In the ER he underwent urgent cardioversion and was subsequently converted to sinus rhythm with improvement in symptoms. Of note patient was last hospitalized in may of this year due to orthostatic hypotension related to diuretics which were subsequently discontinued at time of discharge. Patient's labs revealed mild renal insufficiency and mildly elevated calcium of 11.[name]. Patient's POC TNI was mildly elevated at 0.21. Chest x-ray showed interstitial opacities worsened from prior study with underlying vascular congestion concerning for pulmonary edema or possibly pneumonia superimposed on the patient's chronic interstitial lung disease.  Since admission patient's respiratory symptoms have improved markedly with rate control and use of diuretic. Lactic acid was elevated at presentation at 3.44 concerning for hypoperfusion that may be related to SVT and/or dehydration. Lasix was not resumed at  admission. Patient was evaluated by EP 9/14 and underwent RF ablation on that same date. Cardiac catheterization 9/15 revealed severe 2V CAD requiring stent placement. He required foley post cath for acute urinary retention in setting of BPH.   HPI/Subjective: No CP or SOB - endorsing extreme urinary urgency - has been unable to void post cath  Assessment/Plan:    AKI on CKD stage 3 -Related to poor perfusion in setting of SVT-crt has essentially returned to his normal     Acute tachycardia related decompensated heart failure/grade [name] diastolic dysfunction -Likely transient and related to SVT as opposed to true heart failure-Lasix not continued at admission    SVT -Currently resolved post cardioversion in the ER-cardiology initiated Toprol-XL for improved rate control-post EP study with catheter ablation 9/14    Elevated troponin/severe 2 V CAD -Likely secondary to SVT prior to presentation-cardiac cath with severe 2 vessel CAD with 100% occluded RCA (that essentially has entire distal branch system perfused via collaterals) as well as tandem 80% and 90% lesions in the proximal to mid circumflex-lateral OM - post long stent to both lesions- TNI has peaked thus far at 3.48--echocardiogram revealed preserved LV function and no RWMAs-cont beta blocker, statin and aspirin-will need dual anti-platelet rxn for [name] year   BPH/acute urinary retention -insert foley and leave in until 6 am tomorrow if sx persist - urecholine for short course     Acute respiratory failure with hypoxia/Asbestosis/COPD/? Community acquired pneumonia -No active wheezing on exam and suspect respiratory symptoms were transient and related to SVT-question of infiltrates so was been begun on community-acquired pneumonia antibiotics-afebrile but did have mild leukocytosis at presentation    HTN  -Blood pressure currently controlled-continue Norvasc and Toprol  Anemia? -Hgb has decreased from ~13 to 10.7 since  admission-follow trend- since no apparent active bleeding any possible GI evaluation can be accomplished in the OP setting    TOBACCO ABUSE, hx    REFLEX SYMPATHETIC DYSTROPHY -Continue Neurontin    GERD -Continue PPI    Recent Orthostatic hypotension -Noted with mild renal insufficiency and hypercalcemia at presentation and may have worsened after the addition of diuretics at presentation-follow renal function and administer IV fluids if indicated    Two small Nonruptured cerebral aneurysm -Currently without any clinical symptoms to suggest active bleeding  DVT prophylaxis: Order either SCDs or appropriate pharmacological prophylaxis post cardiac catheterization Code Status: Full Family Communication: No family at bedside Disposition Plan/Expected LOS: Remain in Telemetry-consider dc in am if stable post cath  Consultants: Cardiology EP  Procedures: Lower extremity arterial duplex pending  Electrophysiology study with catheter ablation of AVN reentry tachycardia  2-D echocardiogram - Left ventricle: The cavity size was normal. Wall thickness was increased in a pattern of mild LVH. Systolic function was normal. The estimated ejection fraction was in the range of 55% to 60%. Wall motion was normal; there were no regional wall motion abnormalities. Doppler parameters are consistent with abnormal left ventricular relaxation (grade [name] diastolic dysfunction). The E/e&' ratio is between 8-15, suggesting indeterminate LV filling pressure. - Aortic valve: Structurally normal valve. Trileaflet. There was mild regurgitation. - Mitral valve: Mildly thickened leaflets . There was moderate regurgitation. - Left atrium: Mildly dilated at 35 ml/m2. Impressions: - Compared to the echo in 09/2013, there are few changes - there is now moderate MR.   Cardiac catheterization pending  Cultures: 9/14 blood cultures x2 pending/NGTD  Antibiotics: Zithromax 9/13 >>> Rocephin 9/13  >>>  Objective: Blood pressure 222/102, pulse 80, temperature 97.7 F (36.5 C), temperature source Oral, resp. rate 20, height 6' ([name].829 m), weight 92.534 kg (204 lb), SpO2 97.00%.  Intake/Output Summary (Last 24 hours) at 01/17/14 1700 Last data filed at 01/17/14 0855  Gross per 24 hour  Intake    480 ml  Output    975 ml  Net   -495 ml   Exam: Gen: No acute respiratory distress Chest: Clear to auscultation bilaterally without wheezes, rhonchi or crackles, room air Cardiac: Regular rate and rhythm, S1-S2, no rubs murmurs or gallops, no peripheral edema, no JVD Abdomen: Soft nontender nondistended without obvious hepatosplenomegaly, no ascites Extremities: Symmetrical in appearance without cyanosis, clubbing or effusion  Scheduled Meds:  Scheduled Meds: . [START ON 01/18/2014] amLODipine  10 mg Oral Daily  . antiseptic oral rinse  7 mL Mouth Rinse BID  . [START ON 01/18/2014] aspirin  325 mg Oral Daily  . atorvastatin  80 mg Oral q1800  . azithromycin  500 mg Intravenous Q24H  . bethanechol  10 mg Oral QID  . cefTRIAXone (ROCEPHIN)  IV  [name] g Intravenous Q24H  . cloNIDine  0.[name] mg Oral TID  . enoxaparin (LOVENOX) injection  40 mg Subcutaneous Q24H  . finasteride  5 mg Oral Daily  . gabapentin  300 mg Oral TID  . metoprolol succinate  50 mg Oral BID  . nortriptyline  50 mg Oral QHS  . pantoprazole  40 mg Oral Daily  . pneumococcal 23 valent vaccine  0.5 mL Intramuscular Tomorrow-1000  . tamsulosin  0.4 mg Oral Daily  . ticagrelor  90 mg Oral BID   Data Reviewed: Basic Metabolic Panel:  Recent Labs Lab 01/16/14 0303 01/17/14 0035  NA 136* 139  K 4.7  4.0  CL 95* 99  CO2 26 28  GLUCOSE 145* 123*  BUN 19 19  CREATININE [name].60* [name].30  CALCIUM 11.[name]* 9.5  MG [name].9  --    Liver Function Tests:  Recent Labs Lab 01/16/14 0303  AST 23  ALT 19  ALKPHOS 79  BILITOT 0.3  PROT 8.8*  ALBUMIN 4.0   CBC:  Recent Labs Lab 01/16/14 0303 01/17/14 0035  WBC 13.4* 6.9   NEUTROABS 10.0*  --   HGB 13.6 10.8*  HCT 41.2 32.6*  MCV 89.2 88.6  PLT 396 253   Cardiac Enzymes:  Recent Labs Lab 01/16/14 0453 01/16/14 1205 01/16/14 1851 01/17/14 0035  TROPONINI 2.04* 2.76* 2.48* 3.48*   BNP (last 3 results)  Recent Labs  01/16/14 0237  PROBNP 2047.0*    Recent Results (from the past 240 hour(s))  CULTURE, BLOOD (ROUTINE X 2)     Status: None   Collection Time    01/16/14  3:03 AM      Result Value Ref Range Status   Specimen Description BLOOD LEFT ARM   Final   Special Requests BOTTLES DRAWN AEROBIC AND ANAEROBIC 5CC   Final   Culture  Setup Time     Final   Value: 01/16/2014 08:02     Performed at [facility]   Culture     Final   Value:        BLOOD CULTURE RECEIVED NO GROWTH TO DATE CULTURE WILL BE HELD FOR 5 DAYS BEFORE ISSUING A FINAL NEGATIVE REPORT     Performed at [facility]   Report Status PENDING   Incomplete  CULTURE, BLOOD (ROUTINE X 2)     Status: None   Collection Time    01/16/14  3:13 AM      Result Value Ref Range Status   Specimen Description BLOOD RIGHT ARM   Final   Special Requests BOTTLES DRAWN AEROBIC AND ANAEROBIC 10CC   Final   Culture  Setup Time     Final   Value: 01/16/2014 08:02     Performed at [facility]   Culture     Final   Value:        BLOOD CULTURE RECEIVED NO GROWTH TO DATE CULTURE WILL BE HELD FOR 5 DAYS BEFORE ISSUING A FINAL NEGATIVE REPORT     Performed at [facility]   Report Status PENDING   Incomplete  MRSA PCR SCREENING     Status: None   Collection Time    01/16/14  6:41 AM      Result Value Ref Range Status   MRSA by PCR NEGATIVE  NEGATIVE Final   Comment:            The GeneXpert MRSA Assay (FDA     approved for NASAL specimens     only), is one component of a     comprehensive MRSA colonization     surveillance program. It is not     intended to diagnose MRSA     infection nor to guide or     monitor treatment for     MRSA infections.      Studies:  Recent x-ray studies have been reviewed in detail by the Attending Physician  Time spent :  [place], ANP [facility] Office  [phone] Pager [phone]   **If unable to reach the above provider after paging please contact the [place] @ [phone]  On-Call/Text  Page:      [email or web address]      password [email or web address]  If 7PM-7AM, please contact night-coverage [email or web address] Password [email or web address] 01/17/2014, 5:00 PM   LOS: [name] day   I have personally examined this patient and reviewed the entire database. I have reviewed the above note, made any necessary editorial changes, and agree with its content.  Cherene Altes, MD [facility]

## 2014-01-17 NOTE — Clinical Documentation Improvement (Signed)
Documentation states AKI on CKD.  CH[facility] historical labs as below.  Please provide greater specificity of CKD stage, if known, and document in progress note and discharge summary.  Latest Ref Rng      6 - 23 mg/dL 0.50 - 1.35 mg/dL  12/19/2009      9:10 AM  1.40  12/19/2009      2:48 PM 16 1.49  12/20/2009      12 1.48  12/21/2009      8 1.34  12/22/2009      7 1.23  12/23/2009      7 1.29  05/24/2012      12 1.4  11/01/2012      11 1.21  11/02/2012      9 1.15  09/05/2013      15 1.48 (H)  09/06/2013      16 1.29  09/07/2013      12 1.11  01/16/2014      3:03 AM 19 1.60 (H)  01/17/2014      19 1.30   Component      GFR calc non Af Amer  Latest Ref Rng      >90 mL/min  12/19/2009      9:10 AM 51 (L)  12/19/2009      2:48 PM 47 (L)  12/20/2009      48 (L)  12/21/2009      54 (L)  12/22/2009      59 (L)  12/23/2009      56 (L)  11/01/2012      60 (L)  11/02/2012      64 (L)  09/05/2013      47 (L)  09/06/2013      55 (L)  09/07/2013      66 (L)  01/16/2014      3:03 AM 42 (L)  01/17/2014      54 (L)   Possible Clinical Conditions?   -CKD Stage I - GFR > OR = 90 -CKD Stage II - GFR 60-80 -CKD Stage III - GFR 30-59 -Other condition_____________ -Cannot Clinically determine   Thank you, Mateo Flow, RN [phone] Clinical Documentation Specialist

## 2014-01-17 NOTE — Care Management Note (Addendum)
    Page 1 of 2   01/20/2014     2:25:31 PM CARE MANAGEMENT NOTE 01/20/2014  Patient:  Curtis [name], Curtis [name]   Account Number:  [id number]  Date Initiated:  01/16/2014  Documentation initiated by:  DAVIS,RHONDA  Subjective/Objective Assessment:   medical history of GERD, hypertension, diastolic dysfunction, pulmonary asbestosis, COPD, cerebral aneurysm.  The patient presented with complaints of shortness of breath along with chest pain     Action/Plan:   will follow for dc needs/lives alone but does have family support   Anticipated DC Date:  01/19/2014   Anticipated DC Plan:  HOME/SELF CARE  In-house referral  NA      DC Planning Services  NA      [facility] Choice  NA   Choice offered to / List presented to:  NA   DME arranged  NA      DME agency  NA     [place] arranged  NA      [place] agency  NA   Status of service:  Completed, signed off Medicare Important Message given?  YES (If response is "NO", the following Medicare IM given date fields will be blank) Date Medicare IM given:  01/17/2014 Medicare IM given by:  GRAVES-BIGELOW,BRENDA Date Additional Medicare IM given:   Additional Medicare IM given by:    Discharge Disposition:  [place]  Per UR Regulation:  Reviewed for med. necessity/level of care/duration of stay  If discussed at [place] of Stay Meetings, dates discussed:    Comments:  01/20/14 [place], MSN, CM- Orders received to arrange HHPT for patient for discharge home today. CM met with patient, who is declining [place] services at this time.  CM encouraged patient to notify his PCP should any HH needs arise after discharge.  Patient verbalized understanding. RN updated.    01/18/14 1000Camellia J. Clydene Laming, RN, BSN, [employer] [phone] Spoke with pt at bedside regarding benefits check for Brilinta.  Pt has brochure with 30 day free card and refill assistance card intact.  Pt utilizes [facility] in [place], [place] for prescription  needs.  NCM called pharmacy to confirm availability of medication. Information relayed to pt.  Pt verbalizes importance of filling medication upon discharge.       Rhonda Davis,RN,BSN,CCM

## 2014-01-17 NOTE — H&P (View-Only) (Signed)
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 amLODipine  5 mg Oral Daily  . antiseptic oral rinse  7 mL Mouth Rinse BID  . [START ON 01/18/2014] aspirin  81 mg Oral Pre-Cath  . [START ON 01/18/2014] aspirin  325 mg Oral Daily  . atorvastatin  80 mg Oral q1800  . azithromycin  500 mg Intravenous Q24H  . cefTRIAXone (ROCEPHIN)  IV  1 g Intravenous Q24H  . enoxaparin (LOVENOX) injection  40 mg Subcutaneous Q24H  . finasteride  5 mg Oral Daily  . gabapentin  300 mg Oral TID  . metoprolol succinate  50 mg Oral BID  . nortriptyline  50 mg Oral QHS  . pantoprazole  40 mg Oral Daily  . pneumococcal 23 valent vaccine  0.5 mL Intramuscular Tomorrow-1000  . sodium chloride  3 mL Intravenous Q12H  . sodium chloride  3 mL Intravenous Q12H  . sodium chloride  3 mL Intravenous Q12H  . tamsulosin  0.4 mg Oral Daily    OBJECTIVE  Filed Vitals:   01/16/14 1550 01/16/14 1945 01/16/14 2100 01/17/14 0500  BP: 148/80 169/78 151/59 149/66  Pulse: 73 76 88 73  Temp:   97.7 F (36.5 C) 97.7 F (36.5 C)  TempSrc:      Resp: 14  18 16   Height:      Weight:    204 lb (92.534 kg)  SpO2: 97%  96% 93%    Intake/Output Summary (Last 24 hours) at 01/17/14 0814 Last data filed at 01/16/14 2245  Gross per 24 hour  Intake  578.5 ml  Output   1200 ml  Net -621.5 ml   Filed Weights   01/16/14 0642 01/16/14 1108 01/17/14 0500  Weight: 198 lb 10.2 oz (90.1 kg) 198 lb 10.2 oz (90.1 kg) 204 lb (92.534 kg)    PHYSICAL  EXAM  General: Pleasant, NAD. Neuro: Alert and oriented X 3. Moves all extremities spontaneously. Psych: Normal affect. HEENT:  Normal  Neck: Supple without bruits or JVD. Lungs:  Resp regular and unlabored, CTA. Heart: RRR no s3, s4, or murmurs. Abdomen: Soft, non-tender, non-distended, BS + x 4.  Extremities: No clubbing, cyanosis or edema. DP/PT/Radials 2+ and equal bilaterally.  Accessory Clinical Findings  CBC  Recent Labs  01/16/14 0303 01/17/14 0035  WBC 13.4* 6.9  NEUTROABS 10.0*  --   HGB 13.6 10.8*  HCT 41.2 32.6*  MCV 89.2 88.6  PLT 396 371   Basic Metabolic Panel  Recent Labs  01/16/14 0303 01/17/14 0035  NA 136* 139  K 4.7 4.0  CL 95* 99  CO2 26 28  GLUCOSE 145* 123*  BUN 19 19  CREATININE 1.60* 1.30  CALCIUM 11.1* 9.5  MG 1.9  --    Liver Function Tests  Recent Labs  01/16/14 0303  AST 23  ALT 19  ALKPHOS 79  BILITOT 0.3  PROT 8.8*  ALBUMIN 4.0   Cardiac Enzymes  Recent Labs  01/16/14 1205 01/16/14  1851 01/17/14 0035  TROPONINI 2.76* 2.48* 3.48*   BNP No components found with this basename: POCBNP,  D-Dimer  Recent Labs  01/16/14 0453  DDIMER 0.43   Thyroid Function Tests  Recent Labs  01/16/14 0453  TSH 2.380    TELE NSR with HR 60-70s, no significant ventricular ectopy    ECG  NSR with HR 70s, no significant ST-T wave changes  Echocardiogram  LV EF: 55% - 60%  ------------------------------------------------------------------- Indications: CHF - 428.0.  ------------------------------------------------------------------- History: PMH: SVT. Dyspnea. Transient ischemic attack. Chronic obstructive pulmonary disease. Risk factors: Former tobacco use. Dyslipidemia.  ------------------------------------------------------------------- Study Conclusions  - Left ventricle: The cavity size was normal. Wall thickness was increased in a pattern of mild LVH. Systolic function was normal. The estimated ejection  fraction was in the range of 55% to 60%. Wall motion was normal; there were no regional wall motion abnormalities. Doppler parameters are consistent with abnormal left ventricular relaxation (grade 1 diastolic dysfunction). The E/e&' ratio is between 8-15, suggesting indeterminate LV filling pressure. - Aortic valve: Structurally normal valve. Trileaflet. There was mild regurgitation. - Mitral valve: Mildly thickened leaflets . There was moderate regurgitation. - Left atrium: Mildly dilated at 35 ml/m2.  Impressions:  - Compared to the echo in 09/2013, there are few changes - there is now moderate MR.      Radiology/Studies  Dg Chest Portable 1 View  01/16/2014   CLINICAL DATA:  Chest pain and shortness of breath.  EXAM: PORTABLE CHEST - 1 VIEW  COMPARISON:  Chest radiograph performed 09/05/2013  FINDINGS: The lungs are well-aerated. Interstitial opacities are again noted, with a peripheral predominance. This appears worsened from the prior study, with underlying vascular congestion. This may reflect pneumonia or pulmonary edema, superimposed on the patient's chronic interstitial lung disease. There is no evidence of pleural effusion or pneumothorax.  The cardiomediastinal silhouette is mildly enlarged. An external pacing pad is noted. No acute osseous abnormalities are seen.  IMPRESSION: Interstitial opacities are worsened from the prior study, with underlying vascular congestion and mild cardiomegaly. This may reflect pulmonary edema or pneumonia, superimposed on the patient's chronic interstitial lung disease.   Electronically Signed   By: Garald Balding M.D.   On: 01/16/2014 02:14    ASSESSMENT AND PLAN  1. AVNRT   - s/p successful EPS and ablation by Dr. Lovena Le 01/16/2014  2. Elevated troponin  - per Dr. Aundra Dubin, likely demand ischemia in the setting of SVT, however symptomatic with significant troponin rise 2.04 --> 2.76 --> 2.48 --> 3.48, pending LHC today to definitively eval, no  LVgram given CKD with Cr 1.3  - plan for cardiac cath today, if normal, may be discharged later today vs tomorrow  3. Chronic diastolic HF 4. Acute on chronic renal insufficiency 5. HTN 6. PAD 7. Possible PNA  Signed, Woodward Ku Pager: [phone]  Personally seen and examined. Agree with above. Agree, cardiac catheterization to define coronary anatomy with elevated troponin. Likely demand ischemia that he may have underlying significant coronary artery disease. Candee Furbish, MD

## 2014-01-17 NOTE — Progress Notes (Signed)
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 amLODipine  5 mg Oral Daily  . antiseptic oral rinse  7 mL Mouth Rinse BID  . [START ON 01/18/2014] aspirin  81 mg Oral Pre-Cath  . [START ON 01/18/2014] aspirin  325 mg Oral Daily  . atorvastatin  80 mg Oral q1800  . azithromycin  500 mg Intravenous Q24H  . cefTRIAXone (ROCEPHIN)  IV  1 g Intravenous Q24H  . enoxaparin (LOVENOX) injection  40 mg Subcutaneous Q24H  . finasteride  5 mg Oral Daily  . gabapentin  300 mg Oral TID  . metoprolol succinate  50 mg Oral BID  . nortriptyline  50 mg Oral QHS  . pantoprazole  40 mg Oral Daily  . pneumococcal 23 valent vaccine  0.5 mL Intramuscular Tomorrow-1000  . sodium chloride  3 mL Intravenous Q12H  . sodium chloride  3 mL Intravenous Q12H  . sodium chloride  3 mL Intravenous Q12H  . tamsulosin  0.4 mg Oral Daily    OBJECTIVE  Filed Vitals:   01/16/14 1550 01/16/14 1945 01/16/14 2100 01/17/14 0500  BP: 148/80 169/78 151/59 149/66  Pulse: 73 76 88 73  Temp:   97.7 F (36.5 C) 97.7 F (36.5 C)  TempSrc:      Resp: 14  18 16   Height:      Weight:    204 lb (92.534 kg)  SpO2: 97%  96% 93%    Intake/Output Summary (Last 24 hours) at 01/17/14 0814 Last data filed at 01/16/14 2245  Gross per 24 hour  Intake  578.5 ml  Output   1200 ml  Net -621.5 ml   Filed Weights   01/16/14 0642 01/16/14 1108 01/17/14 0500  Weight: 198 lb 10.2 oz (90.1 kg) 198 lb 10.2 oz (90.1 kg) 204 lb (92.534 kg)    PHYSICAL  EXAM  General: Pleasant, NAD. Neuro: Alert and oriented X 3. Moves all extremities spontaneously. Psych: Normal affect. HEENT:  Normal  Neck: Supple without bruits or JVD. Lungs:  Resp regular and unlabored, CTA. Heart: RRR no s3, s4, or murmurs. Abdomen: Soft, non-tender, non-distended, BS + x 4.  Extremities: No clubbing, cyanosis or edema. DP/PT/Radials 2+ and equal bilaterally.  Accessory Clinical Findings  CBC  Recent Labs  01/16/14 0303 01/17/14 0035  WBC 13.4* 6.9  NEUTROABS 10.0*  --   HGB 13.6 10.8*  HCT 41.2 32.6*  MCV 89.2 88.6  PLT 396 423   Basic Metabolic Panel  Recent Labs  01/16/14 0303 01/17/14 0035  NA 136* 139  K 4.7 4.0  CL 95* 99  CO2 26 28  GLUCOSE 145* 123*  BUN 19 19  CREATININE 1.60* 1.30  CALCIUM 11.1* 9.5  MG 1.9  --    Liver Function Tests  Recent Labs  01/16/14 0303  AST 23  ALT 19  ALKPHOS 79  BILITOT 0.3  PROT 8.8*  ALBUMIN 4.0   Cardiac Enzymes  Recent Labs  01/16/14 1205 01/16/14  1851 01/17/14 0035  TROPONINI 2.76* 2.48* 3.48*   BNP No components found with this basename: POCBNP,  D-Dimer  Recent Labs  01/16/14 0453  DDIMER 0.43   Thyroid Function Tests  Recent Labs  01/16/14 0453  TSH 2.380    TELE NSR with HR 60-70s, no significant ventricular ectopy    ECG  NSR with HR 70s, no significant ST-T wave changes  Echocardiogram  LV EF: 55% - 60%  ------------------------------------------------------------------- Indications: CHF - 428.0.  ------------------------------------------------------------------- History: PMH: SVT. Dyspnea. Transient ischemic attack. Chronic obstructive pulmonary disease. Risk factors: Former tobacco use. Dyslipidemia.  ------------------------------------------------------------------- Study Conclusions  - Left ventricle: The cavity size was normal. Wall thickness was increased in a pattern of mild LVH. Systolic function was normal. The estimated ejection  fraction was in the range of 55% to 60%. Wall motion was normal; there were no regional wall motion abnormalities. Doppler parameters are consistent with abnormal left ventricular relaxation (grade 1 diastolic dysfunction). The E/e&' ratio is between 8-15, suggesting indeterminate LV filling pressure. - Aortic valve: Structurally normal valve. Trileaflet. There was mild regurgitation. - Mitral valve: Mildly thickened leaflets . There was moderate regurgitation. - Left atrium: Mildly dilated at 35 ml/m2.  Impressions:  - Compared to the echo in 09/2013, there are few changes - there is now moderate MR.      Radiology/Studies  Dg Chest Portable 1 View  01/16/2014   CLINICAL DATA:  Chest pain and shortness of breath.  EXAM: PORTABLE CHEST - 1 VIEW  COMPARISON:  Chest radiograph performed 09/05/2013  FINDINGS: The lungs are well-aerated. Interstitial opacities are again noted, with a peripheral predominance. This appears worsened from the prior study, with underlying vascular congestion. This may reflect pneumonia or pulmonary edema, superimposed on the patient's chronic interstitial lung disease. There is no evidence of pleural effusion or pneumothorax.  The cardiomediastinal silhouette is mildly enlarged. An external pacing pad is noted. No acute osseous abnormalities are seen.  IMPRESSION: Interstitial opacities are worsened from the prior study, with underlying vascular congestion and mild cardiomegaly. This may reflect pulmonary edema or pneumonia, superimposed on the patient's chronic interstitial lung disease.   Electronically Signed   By: Garald Balding M.D.   On: 01/16/2014 02:14    ASSESSMENT AND PLAN  1. AVNRT   - s/p successful EPS and ablation by Dr. Lovena Le 01/16/2014  2. Elevated troponin  - per Dr. Aundra Dubin, likely demand ischemia in the setting of SVT, however symptomatic with significant troponin rise 2.04 --> 2.76 --> 2.48 --> 3.48, pending LHC today to definitively eval, no  LVgram given CKD with Cr 1.3  - plan for cardiac cath today, if normal, may be discharged later today vs tomorrow  3. Chronic diastolic HF 4. Acute on chronic renal insufficiency 5. HTN 6. PAD 7. Possible PNA  Signed, Woodward Ku Pager: [phone]  Personally seen and examined. Agree with above. Agree, cardiac catheterization to define coronary anatomy with elevated troponin. Likely demand ischemia that he may have underlying significant coronary artery disease. Candee Furbish, MD

## 2014-01-17 NOTE — Progress Notes (Signed)
VASCULAR LAB PRELIMINARY  ARTERIAL  ABI completed:    RIGHT    LEFT    PRESSURE WAVEFORM  PRESSURE WAVEFORM  BRACHIAL 180 Triphasic BRACHIAL 189 Triphasic  DP 130 Biphasic DP 115 Monophasic  PT 149 Biphasic PT 112 Monophasic    RIGHT LEFT  ABI 0.79 0.61   ABIs on the right indicate a mild reduction in arterial flow with Doppler waveforms within normal limits. Left ABI indicates a moderate reduction in arterial flow with abnormal Doppler waveforms.  Duplex scan Right - There is evidence of mild irregular heterogeneous plaque throughout the lower extremity with no evidence of significant stenosis. LEFT -  There is mild to moderate irregular heterogeneous plaque noted in the common femoral artery increasing to moderate to severe in the superficial femoral artery. There is a near occlusion in the mid to distal superficial femoral artery with diminishing flow distally. Doppler waveforms are abnormal throughout the left lower extremity consistent with left iliofemoral disease. Arteries appear small in diameter when compared to those on the right.   Quantae Martel, RVS 01/17/2014, 11:31 AM

## 2014-01-18 DIAGNOSIS — I214 Non-ST elevation (NSTEMI) myocardial infarction: Secondary | ICD-10-CM

## 2014-01-18 DIAGNOSIS — I200 Unstable angina: Secondary | ICD-10-CM

## 2014-01-18 LAB — CBC
HCT: 33.1 % — ABNORMAL LOW (ref 39.0–52.0)
Hemoglobin: 11.1 g/dL — ABNORMAL LOW (ref 13.0–17.0)
MCH: 29.1 pg (ref 26.0–34.0)
MCHC: 33.5 g/dL (ref 30.0–36.0)
MCV: 86.6 fL (ref 78.0–100.0)
PLATELETS: 275 10*3/uL (ref 150–400)
RBC: 3.82 MIL/uL — AB (ref 4.22–5.81)
RDW: 14.7 % (ref 11.5–15.5)
WBC: 7.4 10*3/uL (ref 4.0–10.5)

## 2014-01-18 LAB — BASIC METABOLIC PANEL
Anion gap: 15 (ref 5–15)
BUN: 20 mg/dL (ref 6–23)
CO2: 25 mEq/L (ref 19–32)
Calcium: 9.2 mg/dL (ref 8.4–10.5)
Chloride: 100 mEq/L (ref 96–112)
Creatinine, Ser: 1.48 mg/dL — ABNORMAL HIGH (ref 0.50–1.35)
GFR calc Af Amer: 54 mL/min — ABNORMAL LOW (ref >90)
GFR calc non Af Amer: 47 mL/min — ABNORMAL LOW (ref >90)
GLUCOSE: 107 mg/dL — AB (ref 70–99)
POTASSIUM: 4 meq/L (ref 3.7–5.3)
Sodium: 140 mEq/L (ref 137–147)

## 2014-01-18 MED ORDER — METOPROLOL SUCCINATE ER 50 MG PO TB24: 50.0000 mg | ORAL_TABLET | Freq: Two times a day (BID) | ORAL | Status: DC

## 2014-01-18 MED ORDER — SODIUM CHLORIDE 0.9 % IV BOLUS (SEPSIS)
500.0000 mL | Freq: Once | INTRAVENOUS | Status: AC
  Administered 2014-01-18: 12:00:00 500 mL via INTRAVENOUS

## 2014-01-18 MED ORDER — SODIUM CHLORIDE 0.9 % IV SOLN
INTRAVENOUS | Status: DC
  Administered 2014-01-18: 12:00:00 via INTRAVENOUS

## 2014-01-18 MED ORDER — TICAGRELOR 90 MG PO TABS: 90.0000 mg | ORAL_TABLET | Freq: Two times a day (BID) | ORAL | Status: DC

## 2014-01-18 MED ORDER — METOPROLOL TARTRATE 12.5 MG HALF TABLET
12.5000 mg | ORAL_TABLET | Freq: Two times a day (BID) | ORAL | Status: DC
  Administered 2014-01-18 – 2014-01-19 (×2): 12.5 mg via ORAL
  Filled 2014-01-18 (×3): qty 1

## 2014-01-18 MED ORDER — ACETAMINOPHEN 325 MG PO TABS
650.0000 mg | ORAL_TABLET | Freq: Four times a day (QID) | ORAL | Status: DC | PRN
  Administered 2014-01-19 (×2): 650 mg via ORAL
  Filled 2014-01-18 (×2): qty 2

## 2014-01-18 MED ORDER — ACETAMINOPHEN 650 MG RE SUPP: 650.0000 mg | Freq: Four times a day (QID) | RECTAL | Status: DC | PRN

## 2014-01-18 MED FILL — Sodium Chloride IV Soln 0.9%: INTRAVENOUS | Qty: 50 | Status: AC

## 2014-01-18 NOTE — Progress Notes (Signed)
CARDIAC REHAB PHASE I   PRE:  Rate/Rhythm: 85 SR  BP:  Supine: 104/50  Sitting: 100/55  Standing:    SaO2: 100 RA  MODE:  Ambulation: 230 ft   POST:  Rate/Rhythm: 88 SR  BP:  Supine:   Sitting: 83/43  Standing:   SaO2: 95 RA 0920-1030 On arrival pt in bed, I went to get walker for pt touse to ambulate. He was up in room at sink, c/o of feeling dizzy. I sat pt down on bed and rechecked his BP 100/55 sitting. Dizziness improve. Assisted X 1 and used walker to ambulate. Gait steady with walker. Pt's pace very slow. He walked 230 feet with one sitting rest stop. No c/o of dizziness with walking. On return to room placed pt in recliner with call light in reach. BP 83/43. He denies any dizziness. BP slowly returned to 94/45. Pt stable sitting in recliner without c/o. Started MI and stent education with pt. He is poor historian and it is how to get pt to answer questions without him giving things that has happened to him the past ten years. Education difficult to accomplish with him.  Rodney Langton RN 01/18/2014 10:29 AM

## 2014-01-18 NOTE — Progress Notes (Signed)
     SUBJECTIVE: No chest pain or SOB. No events.   BP 95/43  Pulse 71  Temp(Src) 97.4 F (36.3 C) (Oral)  Resp 16  Ht 6' (1.829 m)  Wt 204 lb 9.4 oz (92.8 kg)  BMI 27.74 kg/m2  SpO2 99%  Intake/Output Summary (Last 24 hours) at 01/18/14 0656 Last data filed at 01/17/14 2130  Gross per 24 hour  Intake 1371.25 ml  Output   1375 ml  Net  -3.75 ml    PHYSICAL EXAM General: Well developed, well nourished, in no acute distress. Alert and oriented x 3.  Psych:  Flat affect, responds appropriately Neck: No JVD. No masses noted.  Lungs: Clear bilaterally with no wheezes or rhonci noted.  Heart: RRR with no murmurs noted. Abdomen: Bowel sounds are present. Soft, non-tender.  Extremities: No lower extremity edema.   LABS: Basic Metabolic Panel:  Recent Labs  01/16/14 0303 01/17/14 0035 01/18/14 0329  NA 136* 139 140  K 4.7 4.0 4.0  CL 95* 99 100  CO2 26 28 25   GLUCOSE 145* 123* 107*  BUN 19 19 20   CREATININE 1.60* 1.30 1.48*  CALCIUM 11.1* 9.5 9.2  MG 1.9  --   --    CBC:  Recent Labs  01/16/14 0303 01/17/14 0035 01/18/14 0329  WBC 13.4* 6.9 7.4  NEUTROABS 10.0*  --   --   HGB 13.6 10.8* 11.1*  HCT 41.2 32.6* 33.1*  MCV 89.2 88.6 86.6  PLT 396 253 275   Cardiac Enzymes:  Recent Labs  01/16/14 1205 01/16/14 1851 01/17/14 0035  TROPONINI 2.76* 2.48* 3.48*   Current Meds: . amLODipine  10 mg Oral Daily  . aspirin  325 mg Oral Daily  . atorvastatin  80 mg Oral q1800  . azithromycin  500 mg Intravenous Q24H  . bethanechol  10 mg Oral QID  . cefTRIAXone (ROCEPHIN)  IV  1 g Intravenous Q24H  . cloNIDine  0.1 mg Oral TID  . enoxaparin (LOVENOX) injection  40 mg Subcutaneous Q24H  . finasteride  5 mg Oral Daily  . gabapentin  300 mg Oral TID  . metoprolol succinate  50 mg Oral BID  . nortriptyline  50 mg Oral QHS  . pantoprazole  40 mg Oral Daily  . pneumococcal 23 valent vaccine  0.5 mL Intramuscular Tomorrow-1000  . tamsulosin  0.4 mg Oral Daily    . ticagrelor  90 mg Oral BID     ASSESSMENT AND PLAN:  1. CAD/NSTEMI: Pt is s/p cardiac cath 01/17/14 per Dr. Ellyn Hack. DES placed Circumflex. Mild disease LAD. 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 He will need to continue ASA and Brilinta for one year.  Continue statin and beta blocker.   2. SVT: s/p ablation 01/16/14 per Dr. Lovena Le.   3. Chronic diastolic HF: Volume status stable this am.   4. Acute on chronic renal insufficiency: Renal function is stable post cath.   5. HTN: BP stable.   6. PAD: Stable.   OK to discharge home today from cardiac perspective. We will arrange f/u with Dr. Aundra Dubin and with Dr. Lovena Le.    Curtis [name]  9/16/20156:56 AM

## 2014-01-18 NOTE — Progress Notes (Signed)
B/p 88/47 mmhg  Pt denies any discomforts, MD made aware, IV bolus /infusion  Given as ordered

## 2014-01-18 NOTE — Progress Notes (Signed)
[name]  YURIY CUI TLX:[id number] DOB: May 06, 1945 DOA: 01/16/2014   PCP: [name], MD  Brief narrative: 68 year old male patient with multiple medical problems including hypertension, diastolic dysfunction, COPD with pulmonary asbestosis and 2 tiny nonruptured cerebral aneurysms. The patient presented to the ER initially because of complaints of shortness of breath and chest pain that began less than 24 hours prior to presentation. He reported his shortness of breath was associated with cough and nausea. During the episode of shortness of breath he began having numbness in his left arm and chest pressure and apparent tachypalpitations. Patient and son waited for a period of time to see if the symptoms would improve but because they did not he presented to the emergency department.  The patient arrived via EMS to the emergency department and was found to be in SVT with rates in the 150s. In addition to the chest pain and shortness of breath he was also dizzy. In the ER he underwent urgent cardioversion and was subsequently converted to sinus rhythm with improvement in symptoms. Of note patient was last hospitalized in may of this year due to orthostatic hypotension related to diuretics which were subsequently discontinued at time of discharge. Patient's labs revealed mild renal insufficiency and mildly elevated calcium of 11.1. Patient's POC TNI was mildly elevated at 0.21. Chest x-ray showed interstitial opacities worsened from prior study with underlying vascular congestion concerning for pulmonary edema or possibly pneumonia superimposed on the patient's chronic interstitial lung disease.  Since admission patient's respiratory symptoms have improved markedly with rate control and use of diuretic. Lactic acid was elevated at presentation at 3.44 concerning for hypoperfusion that may be related to SVT and/or dehydration. Lasix was not resumed at  admission. Patient was evaluated by EP 9/14 and underwent RF ablation on that same date. Cardiac catheterization 9/15 revealed severe 2V CAD requiring stent placement. He required foley post cath for acute urinary retention in setting of BPH.   HPI/Subjective: Remains quit anxious.  Denies cp or sob.  States he is currently able to urinate w/o difficulty.    Assessment/Plan:    Hypotension -9/16  Developed hypotension earlier today with systolic blood pressures in the 90s with further decreases into the 70s when working with cardiac rehabilitation-all antihypertensive medications held and was given a 500 cc saline bolus-patient had significant hypertension overnight due to anxiety and had medication up titration and this in setting of some mild dehydration likely cause of symptomatic hypotension-by 3 PM on 9/16 pressure back to hypertensive range so we'll decrease IV fluids to 50 cc per hour and make an attempt to resume short-acting Lopressor at 12.5 mg twice a day starting at 8 PM tonight-discussed this with RN   Relative Hypercalcemia -Related to volume depletion-after hydration calcium now down to 9.2    AKI on CKD stage 3 -Related to poor perfusion in setting of SVT-creatinine has increased slightly post catheterization-repeat labs in a.m.    Acute tachycardia related decompensated heart failure/grade 1 diastolic dysfunction -Likely transient and related to SVT as opposed to true heart failure-Lasix not continued at admission    SVT/ catheter ablation 9/14 -Currently resolved post cardioversion in the ER-cardiology initiated Toprol-XL for improved rate control but given recent issues with hypotension have opted to stop long-acting form for now and titrate using shorter acting form    Elevated troponin/severe 2 V CAD -Likely secondary to SVT prior to presentation-cardiac cath with severe 2 vessel CAD  with 100% occluded RCA (that essentially has entire distal branch system perfused via  collaterals) as well as tandem 80% and 90% lesions in the proximal to mid circumflex-lateral OM - post long stent to both lesions- TNI has peaked thus far at 3.48--echocardiogram revealed preserved LV function and no RWMAs-cont beta blocker, statin and aspirin-will need dual anti-platelet rxn for 1 year-ask case manager to check on co-pay for Brilinta   BPH/acute urinary retention -Initially Foley catheter placement was planned but shortly thereafter patient had episode of significant overflow incontinence and has not had any further difficulty with urinary retention type symptoms    Acute respiratory failure with hypoxia/Asbestosis/COPD/? Community acquired pneumonia -No active wheezing on exam and suspect respiratory symptoms were transient and related to SVT-? infiltrates so continue community-acquired pneumonia antibiotics-afebrile but did have mild leukocytosis at presentation    HTN  -See above-was on Cozaar prior to admission which is currently on hold-we'll attempt to resume shorter acting form of Lopressor tonight   Anemia? -Hgb has decreased from ~13 to 10.7 since admission-follow trend- since no apparent active bleeding any possible GI evaluation can be accomplished in the OP setting    TOBACCO ABUSE, hx    REFLEX SYMPATHETIC DYSTROPHY -Continue Neurontin    GERD -Continue PPI    Two small Nonruptured cerebral aneurysm -Currently without any clinical symptoms to suggest active bleeding  DVT prophylaxis: Order either SCDs or appropriate pharmacological prophylaxis post cardiac catheterization Code Status: Full Family Communication: No family at bedside Disposition Plan/Expected LOS: Remain in Telemetry  Consultants: Cardiology EP  Procedures: Lower extremity arterial duplex pending  Electrophysiology study with catheter ablation of AVN reentry tachycardia  2-D echocardiogram - Left ventricle: The cavity size was normal. Wall thickness was increased in a pattern of  mild LVH. Systolic function was normal. The estimated ejection fraction was in the range of 55% to 60%. Wall motion was normal; there were no regional wall motion abnormalities. Doppler parameters are consistent with abnormal left ventricular relaxation (grade 1 diastolic dysfunction). The E/e&' ratio is between 8-15, suggesting indeterminate LV filling pressure. - Aortic valve: Structurally normal valve. Trileaflet. There was mild regurgitation. - Mitral valve: Mildly thickened leaflets . There was moderate regurgitation. - Left atrium: Mildly dilated at 35 ml/m2. Impressions: - Compared to the echo in 09/2013, there are few changes - there is now moderate MR.   Cardiac catheterization Severe 2 vessel CAD with 100% occluded RCA (that essentially has entire distal branch system perfused via collaterals) as well as tandem 80% and 90% lesions in the proximal to mid circumflex-lateral OM Successful coverage of both lesions with a single Promus Premier DES 3.5 mm a 38 mm postdilated to 3.9 mm proximally and 3.8 mm distally. Residual ostial 70% stenosis in small marginal branch jailed by stent. Normal mildly elevated LV pressures with known normal EF  Antibiotics: Zithromax 9/13 >>> Rocephin 9/13 >>>  Objective: Blood pressure 191/92, pulse 66, temperature 97.5 F (36.4 C), temperature source Oral, resp. rate 18, height 6' (1.829 m), weight 204 lb 9.4 oz (92.8 kg), SpO2 98.00%.  Intake/Output Summary (Last 24 hours) at 01/18/14 1501 Last data filed at 01/17/14 2130  Gross per 24 hour  Intake 1371.25 ml  Output   1175 ml  Net 196.25 ml   Exam: Gen: No acute respiratory distress Chest: Clear to auscultation bilaterally without wheezes, rhonchi or crackles, room air Cardiac: Regular rate and rhythm, soft blood pressure with systolic readings in the 84Y S1-S2, no rubs murmurs or gallops,  no peripheral edema, no JVD Abdomen: Soft nontender nondistended without obvious hepatosplenomegaly,  no ascites Extremities: Symmetrical in appearance without cyanosis, clubbing or effusion  Scheduled Meds:  Scheduled Meds: . aspirin  325 mg Oral Daily  . atorvastatin  80 mg Oral q1800  . azithromycin  500 mg Intravenous Q24H  . bethanechol  10 mg Oral QID  . cefTRIAXone (ROCEPHIN)  IV  1 g Intravenous Q24H  . enoxaparin (LOVENOX) injection  40 mg Subcutaneous Q24H  . finasteride  5 mg Oral Daily  . gabapentin  300 mg Oral TID  . metoprolol succinate  50 mg Oral BID  . nortriptyline  50 mg Oral QHS  . pantoprazole  40 mg Oral Daily  . pneumococcal 23 valent vaccine  0.5 mL Intramuscular Tomorrow-1000  . tamsulosin  0.4 mg Oral Daily  . ticagrelor  90 mg Oral BID   Data Reviewed: Basic Metabolic Panel:  Recent Labs Lab 01/16/14 0303 01/17/14 0035 01/18/14 0329  NA 136* 139 140  K 4.7 4.0 4.0  CL 95* 99 100  CO2 26 28 25   GLUCOSE 145* 123* 107*  BUN 19 19 20   CREATININE 1.60* 1.30 1.48*  CALCIUM 11.1* 9.5 9.2  MG 1.9  --   --    Liver Function Tests:  Recent Labs Lab 01/16/14 0303  AST 23  ALT 19  ALKPHOS 79  BILITOT 0.3  PROT 8.8*  ALBUMIN 4.0   CBC:  Recent Labs Lab 01/16/14 0303 01/17/14 0035 01/18/14 0329  WBC 13.4* 6.9 7.4  NEUTROABS 10.0*  --   --   HGB 13.6 10.8* 11.1*  HCT 41.2 32.6* 33.1*  MCV 89.2 88.6 86.6  PLT 396 253 275   Cardiac Enzymes:  Recent Labs Lab 01/16/14 0453 01/16/14 1205 01/16/14 1851 01/17/14 0035  TROPONINI 2.04* 2.76* 2.48* 3.48*   BNP (last 3 results)  Recent Labs  01/16/14 0237  PROBNP 2047.0*    Recent Results (from the past 240 hour(s))  CULTURE, BLOOD (ROUTINE X 2)     Status: None   Collection Time    01/16/14  3:03 AM      Result Value Ref Range Status   Specimen Description BLOOD LEFT ARM   Final   Special Requests BOTTLES DRAWN AEROBIC AND ANAEROBIC 5CC   Final   Culture  Setup Time     Final   Value: 01/16/2014 08:02     Performed at [facility]   Culture     Final   Value:         BLOOD CULTURE RECEIVED NO GROWTH TO DATE CULTURE WILL BE HELD FOR 5 DAYS BEFORE ISSUING A FINAL NEGATIVE REPORT     Performed at [facility]   Report Status PENDING   Incomplete  CULTURE, BLOOD (ROUTINE X 2)     Status: None   Collection Time    01/16/14  3:13 AM      Result Value Ref Range Status   Specimen Description BLOOD RIGHT ARM   Final   Special Requests BOTTLES DRAWN AEROBIC AND ANAEROBIC 10CC   Final   Culture  Setup Time     Final   Value: 01/16/2014 08:02     Performed at [facility]   Culture     Final   Value:        BLOOD CULTURE RECEIVED NO GROWTH TO DATE CULTURE WILL BE HELD FOR 5 DAYS BEFORE ISSUING A FINAL NEGATIVE REPORT     Performed  at [facility]   Report Status PENDING   Incomplete  MRSA PCR SCREENING     Status: None   Collection Time    01/16/14  6:41 AM      Result Value Ref Range Status   MRSA by PCR NEGATIVE  NEGATIVE Final   Comment:            The GeneXpert MRSA Assay (FDA     approved for NASAL specimens     only), is one component of a     comprehensive MRSA colonization     surveillance program. It is not     intended to diagnose MRSA     infection nor to guide or     monitor treatment for     MRSA infections.     Studies:  Recent x-ray studies have been reviewed in detail by the Attending Physician  Time spent :  [place], ANP [facility] Office  [phone] Pager [phone]  On-Call/Text Page:      [email or web address]      password [email or web address]  If 7PM-7AM, please contact night-coverage [email or web address] Password [email or web address] 01/18/2014, 3:01 PM   LOS: 2 days   I have personally examined this patient and reviewed the entire database. I have reviewed the above note, made any necessary editorial changes, and agree with its content.  Cherene Altes, MD [facility]

## 2014-01-19 DIAGNOSIS — G9050 Complex regional pain syndrome I, unspecified: Secondary | ICD-10-CM

## 2014-01-19 DIAGNOSIS — I959 Hypotension, unspecified: Secondary | ICD-10-CM

## 2014-01-19 DIAGNOSIS — J9600 Acute respiratory failure, unspecified whether with hypoxia or hypercapnia: Secondary | ICD-10-CM

## 2014-01-19 DIAGNOSIS — I519 Heart disease, unspecified: Secondary | ICD-10-CM

## 2014-01-19 DIAGNOSIS — I70209 Unspecified atherosclerosis of native arteries of extremities, unspecified extremity: Secondary | ICD-10-CM

## 2014-01-19 DIAGNOSIS — I739 Peripheral vascular disease, unspecified: Secondary | ICD-10-CM

## 2014-01-19 DIAGNOSIS — N189 Chronic kidney disease, unspecified: Secondary | ICD-10-CM

## 2014-01-19 LAB — BASIC METABOLIC PANEL
Anion gap: 13 (ref 5–15)
BUN: 20 mg/dL (ref 6–23)
CHLORIDE: 102 meq/L (ref 96–112)
CO2: 25 mEq/L (ref 19–32)
Calcium: 9.5 mg/dL (ref 8.4–10.5)
Creatinine, Ser: 1.52 mg/dL — ABNORMAL HIGH (ref 0.50–1.35)
GFR calc non Af Amer: 45 mL/min — ABNORMAL LOW (ref >90)
GFR, EST AFRICAN AMERICAN: 52 mL/min — AB (ref >90)
Glucose, Bld: 103 mg/dL — ABNORMAL HIGH (ref 70–99)
POTASSIUM: 3.9 meq/L (ref 3.7–5.3)
Sodium: 140 mEq/L (ref 137–147)

## 2014-01-19 MED ORDER — HYDRALAZINE HCL 20 MG/ML IJ SOLN: 10.0000 mg | INTRAMUSCULAR | Status: DC | PRN

## 2014-01-19 MED ORDER — METOPROLOL TARTRATE 50 MG PO TABS: 50.0000 mg | ORAL_TABLET | Freq: Two times a day (BID) | ORAL | Status: DC

## 2014-01-19 MED ORDER — HYDRALAZINE HCL 20 MG/ML IJ SOLN
5.0000 mg | INTRAMUSCULAR | Status: DC | PRN
  Administered 2014-01-19: 18:00:00 5 mg via INTRAVENOUS
  Filled 2014-01-19: qty 1

## 2014-01-19 MED ORDER — METOPROLOL TARTRATE 25 MG PO TABS
25.0000 mg | ORAL_TABLET | Freq: Two times a day (BID) | ORAL | Status: DC
  Administered 2014-01-19 – 2014-01-20 (×2): 25 mg via ORAL
  Filled 2014-01-19 (×3): qty 1

## 2014-01-19 MED ORDER — METOPROLOL TARTRATE 25 MG PO TABS: 25.0000 mg | ORAL_TABLET | Freq: Two times a day (BID) | ORAL | Status: DC

## 2014-01-19 NOTE — Progress Notes (Signed)
     SUBJECTIVE: Only c/o chronic leg pain. No chest pain or SOB  BP 165/67  Pulse 86  Temp(Src) 98.1 F (36.7 C) (Oral)  Resp 20  Ht 6' (1.829 m)  Wt 206 lb 2.1 oz (93.5 kg)  BMI 27.95 kg/m2  SpO2 93%  Intake/Output Summary (Last 24 hours) at 01/19/14 0723 Last data filed at 01/19/14 4132  Gross per 24 hour  Intake   1110 ml  Output   2200 ml  Net  -1090 ml    PHYSICAL EXAM General: Well developed, well nourished, in no acute distress. Alert and oriented x 3.  Psych:  Good affect, responds appropriately Neck: No JVD. No masses noted.  Lungs: Clear bilaterally with no wheezes or rhonci noted.  Heart: RRR with no murmurs noted. Abdomen: Bowel sounds are present. Soft, non-tender.  Extremities: No lower extremity edema.   LABS: Basic Metabolic Panel:  Recent Labs  01/18/14 0329 01/19/14 0338  NA 140 140  K 4.0 3.9  CL 100 102  CO2 25 25  GLUCOSE 107* 103*  BUN 20 20  CREATININE 1.48* 1.52*  CALCIUM 9.2 9.5   CBC:  Recent Labs  01/17/14 0035 01/18/14 0329  WBC 6.9 7.4  HGB 10.8* 11.1*  HCT 32.6* 33.1*  MCV 88.6 86.6  PLT 253 275   Cardiac Enzymes:  Recent Labs  01/16/14 1205 01/16/14 1851 01/17/14 0035  TROPONINI 2.76* 2.48* 3.48*   Current Meds: . aspirin  325 mg Oral Daily  . atorvastatin  80 mg Oral q1800  . azithromycin  500 mg Intravenous Q24H  . cefTRIAXone (ROCEPHIN)  IV  1 g Intravenous Q24H  . enoxaparin (LOVENOX) injection  40 mg Subcutaneous Q24H  . finasteride  5 mg Oral Daily  . gabapentin  300 mg Oral TID  . metoprolol tartrate  12.5 mg Oral BID  . nortriptyline  50 mg Oral QHS  . pantoprazole  40 mg Oral Daily  . pneumococcal 23 valent vaccine  0.5 mL Intramuscular Tomorrow-1000  . tamsulosin  0.4 mg Oral Daily  . ticagrelor  90 mg Oral BID     ASSESSMENT AND PLAN:  1. CAD/NSTEMI: Pt is s/p cardiac cath 01/17/14 per Dr. Ellyn Hack. DES placed Circumflex. Mild disease LAD. Chronic occlusion of RCA with filling from  collaterals. He will need to continue ASA and Brilinta for one year. Continue statin and beta blocker.   2. SVT: s/p ablation 01/16/14 per Dr. Lovena Le.   3. Chronic diastolic HF: Volume status stable this am.   4. Acute on chronic renal insufficiency: Renal function is stable post cath.   5.PAD: Stable.   6. HTN: Management per primary team.   OK to discharge home today from cardiac perspective. We have arranged f/u in our office on 02/01/14 with Richardson Dopp, PA-C.     Curtis [name]  9/17/20157:23 AM

## 2014-01-19 NOTE — Evaluation (Signed)
Physical Therapy Evaluation Patient Details Name: Curtis [name] MRN: [id number] DOB: 1945-09-13 Today's Date: 01/19/2014   History of Present Illness  Patient is a 68 y/o male admitted with chest pain and SOB. (+) troponins. Admitted with SVT with rates in the 150s s/p cardioversion. S/p L cardiac cath 9/15. PMH of hypertension, diastolic dysfunction, COPD with pulmonary asbestosis and 2 tiny nonruptured cerebral aneurysms.   Clinical Impression  Patient presents with functional limitations due to deficits listed in PT problem list (see below). Pt with elevated BP prior to evaluation however able to tolerate activity maintaining BP within safe ranges. Therapist limited ambulation distance secondary to BP concerns (see BPs in vitals section). Education performed to use RW at home until strength and balance improve. Mildly unsteady during gait. Pt would benefit from acute PT and follow up HHPT to improve balance and gait so pt can maximize independence and return to PLOF.     Follow Up Recommendations Home health PT;Supervision for mobility/OOB (pending progress.)    Equipment Recommendations  None recommended by PT    Recommendations for Other Services       Precautions / Restrictions Precautions Precautions: None Restrictions Weight Bearing Restrictions: No      Mobility  Bed Mobility Overal bed mobility: Modified Independent                Transfers Overall transfer level: Needs assistance Equipment used: None Transfers: Sit to/from Stand Sit to Stand: Supervision         General transfer comment: Supervision for safety.  Ambulation/Gait Ambulation/Gait assistance: Min guard Ambulation Distance (Feet): 200 Feet Assistive device: None Gait Pattern/deviations: Step-through pattern;Decreased stride length Gait velocity: .97 ft/sec Gait velocity interpretation: <1.8 ft/sec, indicative of risk for recurrent falls General Gait Details: Pt with very slow gait  speed with short steps. VC to gaze forward instead of down at feet. Mildly unsteady but no overt LOB.  Stairs            Wheelchair Mobility    Modified Rankin (Stroke Patients Only)       Balance Overall balance assessment: Needs assistance   Sitting balance-Leahy Scale: Good       Standing balance-Leahy Scale: Good Standing balance comment: Able to perform static and dynamic standing performing marching without support, no LOB.                             Pertinent Vitals/Pain Pain Assessment: No/denies pain    Home Living Family/patient expects to be discharged to:: Private residence Living Arrangements: Alone Available Help at Discharge: Family;Available PRN/intermittently Type of Home: House Home Access: Stairs to enter;Ramped entrance Entrance Stairs-Rails: Right Entrance Stairs-Number of Steps: 3 Home Layout: One level Home Equipment: Walker - 2 wheels;Cane - quad;Cane - single point      Prior Function Level of Independence: Independent         Comments: does his own grocery shopping, cooking, etc. Son lives next door and helps if needed     Hand Dominance   Dominant Hand: Right    Extremity/Trunk Assessment   Upper Extremity Assessment: Overall WFL for tasks assessed           Lower Extremity Assessment: Overall WFL for tasks assessed;RLE deficits/detail;LLE deficits/detail         Communication   Communication: No difficulties  Cognition Arousal/Alertness: Awake/alert Behavior During Therapy: WFL for tasks assessed/performed Overall Cognitive Status: Within Functional Limits for tasks assessed  General Comments General comments (skin integrity, edema, etc.): Per RN, pt having BP issues this PM. Adjusting BP medications. Monitored throughout evaluation. Pt asymptomatic. BP prior to leaving room 169/98, HR 88 bpm.    Exercises        Assessment/Plan    PT Assessment Patient needs  continued PT services  PT Diagnosis Difficulty walking   PT Problem List Decreased balance;Decreased activity tolerance;Decreased mobility;Decreased knowledge of precautions  PT Treatment Interventions Balance training;Gait training;Therapeutic activities;Therapeutic exercise;Functional mobility training;Patient/family education   PT Goals (Current goals can be found in the Care Plan section) Acute Rehab PT Goals Patient Stated Goal: to go home as soon as possible. PT Goal Formulation: With patient Time For Goal Achievement: 02/02/14 Potential to Achieve Goals: Good    Frequency Min 3X/week   Barriers to discharge Decreased caregiver support Pt lives alone    Co-evaluation               End of Session Equipment Utilized During Treatment: Gait belt Activity Tolerance: Patient tolerated treatment well Patient left: in bed;with call bell/phone within reach;with bed alarm set Nurse Communication: Mobility status         Time: 7680-8811 PT Time Calculation (min): 25 min   Charges:   PT Evaluation $Initial PT Evaluation Tier I: 1 Procedure PT Treatments $Gait Training: 8-22 mins   PT G CodesCandy Sledge A 01/19/2014, 4:27 PM Candy Sledge, PT, DPT [phone]

## 2014-01-19 NOTE — Progress Notes (Signed)
Curt[facility] [name] 1 - Stepdown / ICU Progress Note  Curtis [name] KXF:[id number] DOB: 1945-11-09 DOA: 01/16/2014   PCP: [name], MD  Brief narrative: 68 year old male patient with multiple medical problems including hypertension, diastolic dysfunction, COPD with pulmonary asbestosis and 2 tiny nonruptured cerebral aneurysms. The patient presented to the ER initially because of complaints of shortness of breath and chest pain that began less than 24 hours prior to presentation. He reported his shortness of breath was associated with cough and nausea. During the episode of shortness of breath he began having numbness in his left arm and chest pressure and apparent tachypalpitations. Patient and son waited for a period of time to see if the symptoms would improve but because they did not he presented to the emergency department.  The patient arrived via EMS to the emergency department and was found to be in SVT with rates in the 150s. In addition to the chest pain and shortness of breath he was also dizzy. In the ER he underwent urgent cardioversion and was subsequently converted to sinus rhythm with improvement in symptoms. Of note patient was last hospitalized in may of this year due to orthostatic hypotension related to diuretics which were subsequently discontinued at time of discharge. Patient's labs revealed mild renal insufficiency and mildly elevated calcium of 11.1. Patient's POC TNI was mildly elevated at 0.21. Chest x-ray showed interstitial opacities worsened from prior study with underlying vascular congestion concerning for pulmonary edema or possibly pneumonia superimposed on the patient's chronic interstitial lung disease.  Since admission patient's respiratory symptoms have improved markedly with rate control and use of diuretic. Lactic acid was elevated at presentation at 3.44 concerning for hypoperfusion that may be related to SVT and/or dehydration. Lasix was not resumed at  admission. Patient was evaluated by EP 9/14 and underwent RF ablation on that same date. Cardiac catheterization 9/15 revealed severe 2V CAD requiring stent placement.  Post cardiac catheterization patient had issues with severe hypertension related to anxiety and transient urinary retention. He had several additions to his usual antihypertensive regimen and by the following day had actually had issues with symptomatic hypotension. In addition he has had mild slightly progressive worsening of his renal function requiring use of IV fluids. As of 9/17 the patient's blood pressure has recovered nicely and we're slowly uptitrating his beta blockers. ARB remains on hold. Cardiac rehabilitation has noticed patient with deconditioning-type symptoms and patient confirms that has had issues with progressive weakness since the beginning of the summer. PT/OT evaluations are pending.  HPI/Subjective: Up in chair and currently anxiety controlled with medications.  No chest pain or shortness of breath including with ambulation.  Assessment/Plan:    Hypotension (now resolved); patient currently hypertensive -9/16 hypotension which was postulated due to tube on depletion as well as multiple antihypertensive medications-medications held/IV fluid given and by later in the afternoon blood pressure had rebounded to a hypertensive range-subsequently has tolerated reintroduction of very low-dose beta blocker-plan to up titrate today and monitor for recurrent hypotension -Start hydralazine IV 10 mg PRN SBP >160 or DBP> 100   SVT/ catheter ablation 9/14 -Currently resolved post cardioversion -EP initiated Toprol-XL for improved rate control but given recent issues with hypotension have opted to stop long-acting form for now and titrate using shorter acting form and can convert once proves hemodynamic tolerance to endpoint dose    NSTEMI/severe 2 V CAD -cardiac cath with severe 2 vessel CAD with 100% occluded RCA (that  essentially has entire distal  branch system perfused via collaterals) as well as tandem 80% and 90% lesions in the proximal to mid circumflex-lateral OM - post long stent to both lesions- TNI has peaked thus far at 3.48--echocardiogram revealed preserved LV function and no RWMAs-cont beta blocker, statin and aspirin-will need dual anti-platelet rxn for 1 year-ask case manager has confirmed the appropriateness of patient's medication benefits for Brilinta    AKI on CKD stage 3 -Initially related to poor perfusion in setting of SVT-creatinine had increased slightly post catheterization-repeat on 9/17 has increased slightly to 1.5 to suspect influenced by recent hypotension-if stable in the a.m. and no other indications to remain as an inpatient hopefully can discharge in a.m.   Physical deconditioning -Patient endorsed issues with deconditioning beginning the summer and likely related to underlying newly diagnosed coronary disease and SVT-PT/OT evaluations pending-already has a walker and other equipment at home-discharge disposition either to home with services or to rehabilitation unit-concerned that some of patient's physical intolerance to activity related to symptomatic PVD/claudication   Peripheral vascular disease -Abnormal lower extremity arterial duplex studies with left greater than right disease and findings consistent with left iliofemoral disease as well as disease in the right superficial femoral artery-will consult VVS-unclear when can undergo arteriogram studies suspect can pursue as an outpatient-see above related to possible claudication symptoms   Relative Hypercalcemia -Related to volume depletion-after hydration calcium now down to 9.2    Acute tachycardia related decompensated heart failure/grade 1 diastolic dysfunction -Likely transient and related to SVT as opposed to true heart failure-Lasix not continued at admission   BPH/acute urinary retention -Initially Foley catheter  placement was planned but shortly thereafter patient had episode of significant overflow incontinence and has not had any further difficulty with urinary retention type symptoms    Acute respiratory failure with hypoxia/Asbestosis/COPD/? Community acquired pneumonia -No active wheezing on exam and suspect respiratory symptoms were transient and related to SVT-? infiltrates so continue community-acquired pneumonia antibiotics-afebrile but did have mild leukocytosis at presentation    HTN  -Continue Metoprolol 25 mg BID Now -Hydralazine IV 10 mg  SBP>160 or DBP>100    Anemia? -Hgb has decreased from ~13 to 10.7 since admission-follow trend- since no apparent active bleeding any possible GI evaluation can be accomplished in the OP setting    TOBACCO ABUSE, hx    REFLEX SYMPATHETIC DYSTROPHY -Continue Neurontin    GERD -Continue PPI    Two small Nonruptured cerebral aneurysm -Currently without any clinical symptoms to suggest active bleeding  DVT prophylaxis: Order either SCDs or appropriate pharmacological prophylaxis post cardiac catheterization Code Status: Full Family Communication: No family at bedside Disposition Plan/Expected LOS: Remain in Telemetry-if creatinine stable and no indications to pursue inpatient rehabilitation can likely discharge home on 9/18  Consultants: Cardiology EP VVS  Procedures: 9/15 Lower extremity arterial duplex ABIs reveal mild reduced arterial flow on right moderate reduction in lead-20-49% stenosis distal SFA on right with mild to severe left lower extremity plaque with near occlusion of the distal superficial femoral artery consistent with left iliofemoral disease  9/14 Electrophysiology study with catheter ablation of AVN reentry tachycardia  9/14 2-D echocardiogram - Left ventricle: mild LVH. LVEF= 55% to 60%. -(grade 1 diastolic dysfunction).  - Mitral valve:  Moderate regurgitation. - Left atrium: Mildly dilated  - Compared to the echo in  09/2013, there are few changes - there is now moderate MR.  9/15 Cardiac catheterization Severe 2 vessel CAD with 100% occluded RCA (that essentially has entire distal branch system perfused  via collaterals) as well as tandem 80% and 90% lesions in the proximal to mid circumflex-lateral OM Successful coverage of both lesions with a single Promus Premier DES 3.5 mm a 38 mm postdilated to 3.9 mm proximally and 3.8 mm distally. Residual ostial 70% stenosis in small marginal branch jailed by stent. Normal mildly elevated LV pressures with known normal EF  Antibiotics: Zithromax 9/13 >>> Rocephin 9/13 >>>  Objective: Blood pressure 167/60, pulse 86, temperature 98 F (36.7 C), temperature source Oral, resp. rate 20, height 6' (1.829 m), weight 206 lb 2.1 oz (93.5 kg), SpO2 93.00%.  Intake/Output Summary (Last 24 hours) at 01/19/14 1320 Last data filed at 01/19/14 0900  Gross per 24 hour  Intake   1230 ml  Output   1800 ml  Net   -570 ml   Exam: Gen: No acute respiratory distress Chest: Clear to auscultation bilaterally without wheezes, rhonchi or crackles, room air Cardiac: Regular rate and rhythm, soft blood pressure with systolic readings in the 60Y S1-S2, no rubs murmurs or gallops, no peripheral edema, no JVD Abdomen: Soft nontender nondistended without obvious hepatosplenomegaly, no ascites Extremities: Symmetrical in appearance without cyanosis, clubbing or effusion  Scheduled Meds:  Scheduled Meds: . aspirin  325 mg Oral Daily  . atorvastatin  80 mg Oral q1800  . azithromycin  500 mg Intravenous Q24H  . cefTRIAXone (ROCEPHIN)  IV  1 g Intravenous Q24H  . enoxaparin (LOVENOX) injection  40 mg Subcutaneous Q24H  . finasteride  5 mg Oral Daily  . gabapentin  300 mg Oral TID  . metoprolol tartrate  25 mg Oral BID  . nortriptyline  50 mg Oral QHS  . pantoprazole  40 mg Oral Daily  . pneumococcal 23 valent vaccine  0.5 mL Intramuscular Tomorrow-1000  . tamsulosin  0.4 mg Oral Daily   . ticagrelor  90 mg Oral BID   Data Reviewed: Basic Metabolic Panel:  Recent Labs Lab 01/16/14 0303 01/17/14 0035 01/18/14 0329 01/19/14 0338  NA 136* 139 140 140  K 4.7 4.0 4.0 3.9  CL 95* 99 100 102  CO2 26 28 25 25   GLUCOSE 145* 123* 107* 103*  BUN 19 19 20 20   CREATININE 1.60* 1.30 1.48* 1.52*  CALCIUM 11.1* 9.5 9.2 9.5  MG 1.9  --   --   --    Liver Function Tests:  Recent Labs Lab 01/16/14 0303  AST 23  ALT 19  ALKPHOS 79  BILITOT 0.3  PROT 8.8*  ALBUMIN 4.0   CBC:  Recent Labs Lab 01/16/14 0303 01/17/14 0035 01/18/14 0329  WBC 13.4* 6.9 7.4  NEUTROABS 10.0*  --   --   HGB 13.6 10.8* 11.1*  HCT 41.2 32.6* 33.1*  MCV 89.2 88.6 86.6  PLT 396 253 275   Cardiac Enzymes:  Recent Labs Lab 01/16/14 0453 01/16/14 1205 01/16/14 1851 01/17/14 0035  TROPONINI 2.04* 2.76* 2.48* 3.48*   BNP (last 3 results)  Recent Labs  01/16/14 0237  PROBNP 2047.0*    Recent Results (from the past 240 hour(s))  CULTURE, BLOOD (ROUTINE X 2)     Status: None   Collection Time    01/16/14  3:03 AM      Result Value Ref Range Status   Specimen Description BLOOD LEFT ARM   Final   Special Requests BOTTLES DRAWN AEROBIC AND ANAEROBIC 5CC   Final   Culture  Setup Time     Final   Value: 01/16/2014 08:02     Performed  at [facility]     Final   Value:        BLOOD CULTURE RECEIVED NO GROWTH TO DATE CULTURE WILL BE HELD FOR 5 DAYS BEFORE ISSUING A FINAL NEGATIVE REPORT     Performed at [facility]   Report Status PENDING   Incomplete  CULTURE, BLOOD (ROUTINE X 2)     Status: None   Collection Time    01/16/14  3:13 AM      Result Value Ref Range Status   Specimen Description BLOOD RIGHT ARM   Final   Special Requests BOTTLES DRAWN AEROBIC AND ANAEROBIC 10CC   Final   Culture  Setup Time     Final   Value: 01/16/2014 08:02     Performed at [facility]   Culture     Final   Value:        BLOOD CULTURE RECEIVED NO GROWTH TO  DATE CULTURE WILL BE HELD FOR 5 DAYS BEFORE ISSUING A FINAL NEGATIVE REPORT     Performed at [facility]   Report Status PENDING   Incomplete  MRSA PCR SCREENING     Status: None   Collection Time    01/16/14  6:41 AM      Result Value Ref Range Status   MRSA by PCR NEGATIVE  NEGATIVE Final   Comment:            The GeneXpert MRSA Assay (FDA     approved for NASAL specimens     only), is one component of a     comprehensive MRSA colonization     surveillance program. It is not     intended to diagnose MRSA     infection nor to guide or     monitor treatment for     MRSA infections.     Studies:  Recent x-ray studies have been reviewed in detail by the Attending Physician  Time spent :  [place], ANP [facility] Office  [phone] Pager [phone]  On-Call/Text Page:      [email or web address]      password [email or web address]  If 7PM-7AM, please contact night-coverage [email or web address] Password [email or web address] 01/19/2014, 1:20 PM   LOS: 3 days  Examined patient and discussed assessment and plan with ANP Ebony Hail, and agree with the above plan. Discuss plan with patient and daughter and answered all questions. Patient with, multiple complex medical problems> 40 minutes in direct patient care

## 2014-01-19 NOTE — Consult Note (Signed)
Consult Note  Patient name: Curtis [name] MRN: [id number] DOB: [birth year] Sex: male  Consulting Physician:  Hospitalist service  Reason for Consult:  Chief Complaint  Patient presents with  . Shortness of Breath    HISTORY OF PRESENT ILLNESS: This is a 68 year old gentleman with multiple medical problems who presented to the emergency department with shortness of breath and chest pain he also had left arm numbness and chest pressure.  He was found to be in SVT with rate in the 150s in the emergency department.  He also began complaining of dizziness.  He underwent cardioversion and converted to sinus rhythm with symptomatic improvement.  He underwent RF ablation.  He subsequently had a cardiac catheterization which required stent placement.  The patient also had some shortness of breath which has improved with diuresis.  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  This does lead for occasional discoloration in his foot.  He had Doppler studies of his legs performed which revealed an ankle brachial index of 0.79 on the right and 0.61 on the left.  The patient does not endorse true claudication symptoms.  He states that he has rarely had a cramping in his legs with walking.  He denies rest pain or nonhealing ulcers.  He has been told in the past he has neuropathy.  The patient is a former smoker and quit many years ago.  Past Medical History  Diagnosis Date  . Arthritis   . SOB (shortness of breath)   . Pulmonary asbestosis   . Hernia of unspecified site of abdominal cavity without mention of obstruction or gangrene     hiatal  . GERD (gastroesophageal reflux disease)   . Depression   . Diverticulitis     hospital [facility]  . Diverticulosis   . Reflex sympathetic dystrophy   . 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 Hypertension   . IBS (irritable bowel syndrome)     Past Surgical  History  Procedure Laterality Date  . Nissen fundoplication    . Right elbow surgery      x 2  . Right knee arthroscopy    . Left shoulder       x 3  . Squamous cell skin cancer      Left Hand  . Cataract extraction      History   Social History  . Marital Status: Married    Spouse Name: N/A    Number of Children: N/A  . Years of Education: N/A   Occupational History  . Veteran    Social History Main Topics  . Smoking status: Former Smoker -- 2.50 packs/day for 50 years    Types: Cigarettes    Quit date: 05/05/2005  . Smokeless tobacco: Former Systems developer    Quit date: 05/03/2012  . Alcohol Use: No     Comment: last use 2013  . Drug Use: No  . Sexual Activity: Not on file     Comment: widowed, Veteran   Other Topics Concern  . Not on file   Social History Narrative  . No narrative on file    Family History  Problem Relation Age of Onset  . Colon cancer Mother   . Colon cancer Maternal Aunt   . Colon cancer Maternal Uncle     Allergies as of 01/16/2014 - Review Complete 01/16/2014  Allergen  Reaction Noted  . Codeine Itching     No current facility-administered medications on file prior to encounter.   Current Outpatient Prescriptions on File Prior to Encounter  Medication Sig Dispense Refill  . alprazolam (XANAX) 2 MG tablet Take 2 mg by mouth 2 (two) times daily.      [name] aspirin 325 MG tablet Take 1 tablet (325 mg total) by mouth daily.  30 tablet  5  . finasteride (PROSCAR) 5 MG tablet Take 5 mg by mouth every morning.       . gabapentin (NEURONTIN) 300 MG capsule Take 300 mg by mouth 3 (three) times daily.       [name] losartan (COZAAR) 100 MG tablet Take 1 tablet (100 mg total) by mouth daily.  30 tablet  5  . metoprolol (LOPRESSOR) 50 MG tablet Take 50 mg by mouth 2 (two) times daily.       . nortriptyline (PAMELOR) 50 MG capsule Take 50 mg by mouth at bedtime.      . simvastatin (ZOCOR) 20 MG tablet Take 1 tablet (20 mg total) by mouth at bedtime.  30 tablet   5  . tamsulosin (FLOMAX) 0.4 MG CAPS capsule Take 1 capsule (0.4 mg total) by mouth daily.  30 capsule  5     REVIEW OF SYSTEMS: All other review of systems are in the history of present illness  PHYSICAL EXAMINATION: General: The patient appears their stated age.  Vital signs are BP 131/69  Pulse 92  Temp(Src) 97.3 F (36.3 C) (Axillary)  Resp 20  Ht 6' (1.829 m)  Wt 206 lb 2.1 oz (93.5 kg)  BMI 27.95 kg/m2  SpO2 94% Pulmonary: Respirations are non-labored HEENT:  No gross abnormalities Abdomen: Soft and non-tender.  Aorta is nonpalpable Musculoskeletal: There are no major deformities.   Neurologic: No focal weakness or paresthesias are detected, Skin: There are no ulcer or rashes noted. Psychiatric: The patient has normal affect. Cardiovascular: There is a regular rate and rhythm without significant murmur appreciated.  Pedal pulses are not palpable  Diagnostic Studies: I have reviewed his Doppler studies which revealed 0.79 on the right and 0.6, left.  He significant stenosis on the right with mild irregular plaque throughout.  On the left is near occlusion in the mid to distal superficial femoral artery   Assessment:  Peripheral vascular disease Plan: The patient does have discomfort in his left leg but this has not changed over the past many years.  It has been attributed to reflex sympathetic dystrophy.  Although the patient does have Doppler evidence of moderate peripheral vascular disease, I feel that he is asymptomatic.  Specifically, he does not endorse cramping with activity.  For that reason, I would not recommend any intervention at this time.  He appears to be on maximal medical therapy at this time.  I have scheduled him to followup with me in 3 months.  We did discuss that the only indication for intervention for him at this time would be if he developed a nonhealing ulcer.     Eldridge Abrahams, M.D. [facility] Office:  [phone] Pager:  [phone]

## 2014-01-19 NOTE — Progress Notes (Addendum)
CARDIAC REHAB PHASE I   PRE:  Rate/Rhythm: 99 SR  BP:  Supine: 167/60  Sitting: 156/67  Standing:    SaO2: 96 RA  MODE:  Ambulation: 256 ft   POST:  Rate/Rhythm: 116 ST  BP:  Supine:   Sitting: 153/66  Standing:    SaO2: 97 RA 0800-0855 On arrival pt in bed. Sat him on side of bed,he denies any dizziness today . BP stable lying and sitting. Assisted X 1 and used walker to ambulate. Pt's pace a little faster today. Gait steady with walker. He is less SOB today walking and took no sitting or standing rest stops. Pt to recliner after walk. Completed MI and stent education with pt. He voices understanding. Pt has a walker, quad cane and straight cane at home he can use. He admits to mostly walking around the house but does do his grocery shopping. He is deconditioned. I have encouraged him to walk as he is able, he c/o of a lot of leg and feet pain that he has had for years. He is not appropriate for Outpt. CRP due to his physical condition. Pt had some difficultly with teach back with trying to remember Brilinta, but could recall that he needs to take it bid.   Curtis Langton RN 01/19/2014 8:51 AM

## 2014-01-20 ENCOUNTER — Telehealth: Payer: Self-pay | Admitting: Surgery

## 2014-01-20 LAB — BASIC METABOLIC PANEL
ANION GAP: 13 (ref 5–15)
BUN: 15 mg/dL (ref 6–23)
CHLORIDE: 101 meq/L (ref 96–112)
CO2: 26 mEq/L (ref 19–32)
Calcium: 9.6 mg/dL (ref 8.4–10.5)
Creatinine, Ser: 1.37 mg/dL — ABNORMAL HIGH (ref 0.50–1.35)
GFR calc non Af Amer: 51 mL/min — ABNORMAL LOW (ref >90)
GFR, EST AFRICAN AMERICAN: 59 mL/min — AB (ref >90)
Glucose, Bld: 101 mg/dL — ABNORMAL HIGH (ref 70–99)
POTASSIUM: 4 meq/L (ref 3.7–5.3)
SODIUM: 140 meq/L (ref 137–147)

## 2014-01-20 MED ORDER — METOPROLOL TARTRATE 50 MG PO TABS: ORAL_TABLET | ORAL | Status: DC

## 2014-01-20 MED ORDER — ASPIRIN 81 MG PO CHEW: 81.0000 mg | CHEWABLE_TABLET | Freq: Every day | ORAL | Status: DC

## 2014-01-20 MED ORDER — ASPIRIN 81 MG PO CHEW
81.0000 mg | CHEWABLE_TABLET | Freq: Every day | ORAL | Status: DC
Start: 2014-01-20 — End: 2014-01-20
  Administered 2014-01-20: 81 mg via ORAL
  Filled 2014-01-20: qty 1

## 2014-01-20 NOTE — Progress Notes (Signed)
Physical Therapy Treatment Patient Details Name: Curtis [name] MRN: [id number] DOB: 26-Sep-1945 Today's Date: 01/20/2014    History of Present Illness Patient is a 68 y/o male admitted with chest pain and SOB. (+) troponins. Admitted with SVT with rates in the 150s s/p cardioversion. S/p L cardiac cath 9/15. PMH of hypertension, diastolic dysfunction, COPD with pulmonary asbestosis and 2 tiny nonruptured cerebral aneurysms.    PT Comments    Patient progressing well with mobility. Balance seems improved during gait training with proper cues to increase gait speed. Able to safely transfer off toilet without assist of grab bars and negotiate steps with supervision for safety. Discussed importance of using RW at home for safety until strength and balance improve. Will continue to follow and progress as tolerated.   Follow Up Recommendations  Supervision for mobility/OOB;Home health PT (pt refusing HHPT.)     Equipment Recommendations  None recommended by PT (pt owns RW.)    Recommendations for Other Services       Precautions / Restrictions Precautions Precautions: None Restrictions Weight Bearing Restrictions: No    Mobility  Bed Mobility               General bed mobility comments: Received sitting in chair upon PT arrival.   Transfers Overall transfer level: Needs assistance Equipment used: None Transfers: Sit to/from Stand Sit to Stand: Supervision         General transfer comment: Supervision for safety. Ambulated to bathroom and able to stand from toilet without use of grab bars for assist to simulate home environment (pt built handles on each side of toilet to use if needed).  Ambulation/Gait Ambulation/Gait assistance: Min guard Ambulation Distance (Feet): 250 Feet Assistive device: None Gait Pattern/deviations: Step-through pattern;Decreased stride length Gait velocity: decreased   General Gait Details: Very slow gait speed with short steps. VC to  increase gait speed for more normalized gait pattern and to improve balance. More steady today with increasing gait speed.   Stairs Stairs: Yes Stairs assistance: Min guard Stair Management: Two rails;Step to pattern Number of Stairs: 3 General stair comments: VC for technique and sequencing for safe stair negotiation. Pt requesting to practice steps as he prefers to use them to enter home.  Wheelchair Mobility    Modified Rankin (Stroke Patients Only)       Balance Overall balance assessment: Needs assistance   Sitting balance-Leahy Scale: Good       Standing balance-Leahy Scale: Good                      Cognition Arousal/Alertness: Awake/alert Behavior During Therapy: WFL for tasks assessed/performed Overall Cognitive Status: Within Functional Limits for tasks assessed                      Exercises      General Comments General comments (skin integrity, edema, etc.): Vitals and BP stable this AM. Pt just returned from ambulation with RN.      Pertinent Vitals/Pain Pain Assessment: No/denies pain    Home Living Family/patient expects to be discharged to:: Private residence Living Arrangements: Alone Available Help at Discharge: Family;Available PRN/intermittently Type of Home: House Home Access: Stairs to enter;Ramped entrance Entrance Stairs-Rails: Right Home Layout: One level Home Equipment: Walker - 2 wheels;Cane - quad;Cane - single point Additional Comments: . He has an 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 with loss of active df at the ankle  Prior Function Level of Independence: Independent      Comments: does his own grocery shopping, cooking, etc. Son lives next door and helps if needed   PT Goals (current goals can now be found in the care plan section) Progress towards PT goals: Progressing toward goals    Frequency       PT Plan Current plan remains appropriate    Co-evaluation              End of Session Equipment Utilized During Treatment: Gait belt Activity Tolerance: Patient tolerated treatment well Patient left: in chair;with call bell/phone within reach     Time: 1115-1132 PT Time Calculation (min): 17 min  Charges:  $Gait Training: 8-22 mins                    G CodesCandy Sledge A 01/25/2014, 11:47 AM Candy Sledge, [place], DPT [phone]

## 2014-01-20 NOTE — Telephone Encounter (Addendum)
Message copied by Doristine Section on Fri Jan 20, 2014 10:47 AM ------      Message from: Mena Goes      Created: Fri Jan 20, 2014  8:59 AM      Regarding: Schedule                   ----- Message -----         From: Serafina Mitchell, MD         Sent: 01/19/2014  10:21 PM           To: [facility] Charge Pool            01/19/2014: Level for consult.            Please schedule for followup with Vinnie Level in 3 months ------  notified patient of fu appt. with suzanne on 04-21-14 12:40pm

## 2014-01-20 NOTE — Discharge Summary (Signed)
Physician Discharge Summary  Curtis [name] JYN:[id number] DOB: [birth year] DOA: 01/16/2014  PCP: [name], MD  Admit date: 01/16/2014 Discharge date: 01/20/2014  Time spent: >30 minutes  Recommendations for Outpatient Follow-up:  1. Follow up with PCP for routine post hospital visit 2. Follow up with Dr. Lovena Le as scheduled 3. Follow up with Dr. Trula Slade as scheduled-office will call with appointment (due in 3 months) 4. Cozaar stopped this admission and lower dose of Lopressor continued due to hypotension issues post cath  Discharge Diagnoses:    AKI (acute kidney injury)-resolved   Acute decompensated heart failure-resolved   SVT (supraventricular tachycardia) post ablation   NSTEMI due to CAD- post PCI/stent (left circumflex   Acute respiratory failure with hypoxia   COPD/Asbestosis   HTN (hypertension)   ? Community acquired pneumonia-ruled out   TOBACCO ABUSE, hx   REFLEX SYMPATHETIC DYSTROPHY with PVD   GERD   Recent Orthostatic hypotension   Two small Nonruptured cerebral aneurysm   Discharge Condition: stable  Diet recommendation: Heart healthy  Filed Weights   01/19/14 0500 01/20/14 0010 01/20/14 0404  Weight: 206 lb 2.1 oz (93.5 kg) 207 lb 10.8 oz (94.2 kg) 207 lb 10.8 oz (94.2 kg)    History of present illness:  68 year old male patient with multiple medical problems including hypertension, diastolic dysfunction, COPD with pulmonary asbestosis and 2 tiny nonruptured cerebral aneurysms. The patient presented to the ER initially because of complaints of shortness of breath and chest pain that began less than 24 hours prior to presentation. He reported his shortness of breath was associated with cough and nausea. During the episode of shortness of breath he began having numbness in his left arm and chest pressure and apparent tachypalpitations. Patient and son waited for a period of time to see if the symptoms would improve but because they did not he  presented to the emergency department.   The patient arrived via EMS to the emergency department and was found to be in SVT with rates in the 150s. In addition to the chest pain and shortness of breath he was also dizzy. In the ER he underwent urgent cardioversion and was subsequently converted to sinus rhythm with improvement in symptoms. Of note patient was last hospitalized in may of this year due to orthostatic hypotension related to diuretics which were subsequently discontinued at time of discharge. Patient's labs revealed mild renal insufficiency and mildly elevated calcium of 11.1. Patient's POC TNI was mildly elevated at 0.21. Chest x-ray showed interstitial opacities worsened from prior study with underlying vascular congestion concerning for pulmonary edema or possibly pneumonia superimposed on the patient's chronic interstitial lung disease.   Since admission patient's respiratory symptoms have improved markedly with rate control and use of diuretic. Lactic acid was elevated at presentation at 3.44 concerning for hypoperfusion that may be related to SVT and/or dehydration. Lasix was not resumed at admission. Patient was evaluated by EP 9/14 and underwent RF ablation on that same date. Cardiac catheterization 9/15 revealed severe 2V CAD requiring stent placement.   Post cardiac catheterization patient had issues with severe hypertension related to anxiety and transient urinary retention. He had several additions to his usual antihypertensive regimen and by the following day had actually had issues with symptomatic hypotension. In addition he has had mild slightly progressive worsening of his renal function requiring use of IV fluids. As of 9/17 the patient's blood pressure has recovered nicely and we're slowly uptitrating his beta blockers. ARB remains on hold. Cardiac rehabilitation  has noticed patient with deconditioning-type symptoms and patient confirms that has had issues with progressive  weakness since the beginning of the summer. PT/OTresommended home health services. Arterial duplex was abnormal but no acute issues. Was evaluated by [facility] and will follow up in Dr. Stephens Shire office in 3 months.   Hospital Course:   Hypotension -On 9/16 developed hypotension suspected due to volume depletion as well as multiple antihypertensive medications Anti HTN medications were held/IV fluid given and by later in the afternoon blood pressure had rebounded to a hypertensive range. Has tolerated reintroduction of short acting beta blockers and will discharge on 25 mg Lopressor BID with plans to titrate up further after discharge. Prior home Cozaar dc'd.  SVT/ catheter ablation 9/14  -Resolved post cardioversion. EP initiated Toprol-XL for improved rate control but given recent issues with hypotension have opted to stop long-acting form for now and titrate using shorter acting form and can convert longer acting at a later date once proves hemodynamic tolerance to endpoint dose   NSTEMI/severe 2 V CAD  -Post cardiac cath with findings of severe 2 vessel CAD with 100% occluded RCA (that essentially has entire distal branch system perfused via collaterals) as well as tandem 80% and 90% lesions in the proximal to mid circumflex-lateral OM - post long stent to both lesions. TNI has peaked at 3.48. Echocardiogram revealed preserved LV function and no RWMAs. Continue beta blocker, statin and aspirin. Cards documents will need dual anti-platelet rxn for 1 year. Case manager has confirmed the appropriateness of patient's medication benefits for Brilinta   AKI on CKD stage 3  -Initially related to poor perfusion in setting of SVT.Creatinine had increased slightly post catheterization and repeat on 9/17 had increased slightly to 1.5 so suspected was influenced by recent hypotension on 9/16. On date of discharge had decreased to 1.37  Physical deconditioning  -Patient endorsed issues with deconditioning  beginning this summer and likely related to underlying newly diagnosed coronary disease and SVT. PTevaluation recommended OP services but patient declined. Already has a walker and other equipment at home.  Peripheral vascular disease  -Abnormal lower extremity arterial duplex studies with left greater than right disease and findings consistent with left iliofemoral disease as well as disease in the right superficial femoral artery.-Consulted [facility]. Per their evaluation pt currently asymptomatic and only indication for intervention would be if he developed a non healing ulcer. Dr. Trula Slade has scheduled a 3 month follow up appointment.  Relative Hypercalcemia  -Related to volume depletion.After hydration calcium down to 9.2   Acute tachycardia related decompensated heart failure/grade 1 diastolic dysfunction  -Likely transient and related to SVT as opposed to true heart failure-Lasix not continued at admission   BPH/acute urinary retention  -Initially Foley catheter placement was planned but shortly thereafter patient had episode of significant overflow incontinence and has not had any further difficulty with urinary retention type symptoms   Acute respiratory failure with hypoxia/Asbestosis/COPD/? Community acquired pneumonia  -No active wheezing on exam and suspect respiratory symptoms were transient and related to SVT-? infiltrates so continued community-acquired pneumonia antibiotics during the hospitalization which he completed prior to discharge. Has remained afebrile but did have mild leukocytosis at presentation.  HTN  -Currently well controlled. Continue Metoprolol 25 mg BID at discharge  Anemia?  -Hgb has decreased from ~13 to 10.7 since admission-follow trend. Since no apparent active bleeding GI evaluation can be accomplished in the OP setting.   TOBACCO ABUSE, hx   REFLEX SYMPATHETIC DYSTROPHY  -Continue Neurontin and  continue medical therapy for PVD  GERD  -Continue PPI    Two small Nonruptured cerebral aneurysm  -Currently without any clinical symptoms to suggest active bleeding   Procedures: 9/15 Lower extremity arterial duplex ABIs reveal mild reduced arterial flow on right moderate reduction in lead-20-49% stenosis distal SFA on right with mild to severe left lower extremity plaque with near occlusion of the distal superficial femoral artery consistent with left iliofemoral disease   9/14 Electrophysiology study with catheter ablation of AVN reentry tachycardia   9/14 2-D echocardiogram - Left ventricle: mild LVH. LVEF= 55% to 60%. -(grade 1 diastolic dysfunction).  - Mitral valve: Moderate regurgitation. - Left atrium: Mildly dilated  - Compared to the echo in 09/2013, there are few changes - there is now moderate MR.   9/15 Cardiac catheterization Severe 2 vessel CAD with 100% occluded RCA (that essentially has entire distal branch system perfused via collaterals) as well as tandem 80% and 90% lesions in the proximal to mid circumflex-lateral OM Successful coverage of both lesions with a single Promus Premier DES 3.5 mm a 38 mm postdilated to 3.9 mm proximally and 3.8 mm distally. Residual ostial 70% stenosis in small marginal branch jailed by stent. Normal mildly elevated LV pressures with known normal EF   Consultations: Cardiology  EP  [facility]  Discharge Exam: Filed Vitals:   01/20/14 0823  BP: 155/67  Pulse: 87  Temp: 98.1 F (36.7 C)  Resp: 18   Gen: No acute respiratory distress  Chest: Clear to auscultation bilaterally without wheezes, rhonchi or crackles, room air  Cardiac: Regular rate and rhythm,  S1-S2, no rubs murmurs or gallops, no peripheral edema, no JVD  Abdomen: Soft nontender nondistended without obvious hepatosplenomegaly, no ascites  Extremities: Symmetrical in appearance without cyanosis, clubbing or effusion  Current Discharge Medication List    START taking these medications   Details  aspirin 81 MG chewable tablet  Chew 1 tablet (81 mg total) by mouth daily.    ticagrelor (BRILINTA) 90 MG TABS tablet Take 1 tablet (90 mg total) by mouth 2 (two) times daily. Qty: 60 tablet, Refills: 0      CONTINUE these medications which have CHANGED   Details  metoprolol (LOPRESSOR) 50 MG tablet Take 1/2 of your 50 mg tabs twice daily      CONTINUE these medications which have NOT CHANGED   Details  alprazolam (XANAX) 2 MG tablet Take 2 mg by mouth 2 (two) times daily.    docusate sodium (COLACE) 100 MG capsule Take 100 mg by mouth 2 (two) times daily as needed for mild constipation.    finasteride (PROSCAR) 5 MG tablet Take 5 mg by mouth every morning.     gabapentin (NEURONTIN) 300 MG capsule Take 300 mg by mouth 3 (three) times daily.     nortriptyline (PAMELOR) 50 MG capsule Take 50 mg by mouth at bedtime.    omeprazole (PRILOSEC) 20 MG capsule Take 20 mg by mouth daily.    simvastatin (ZOCOR) 20 MG tablet Take 1 tablet (20 mg total) by mouth at bedtime. Qty: 30 tablet, Refills: 5    tamsulosin (FLOMAX) 0.4 MG CAPS capsule Take 1 capsule (0.4 mg total) by mouth daily. Qty: 30 capsule, Refills: 5      STOP taking these medications     aspirin 325 MG tablet      losartan (COZAAR) 100 MG tablet        Allergies  Allergen Reactions  . Codeine Itching   Follow-up  Information   Follow up with Richardson Dopp, PA-C On 02/01/2014. (11:50 am)    Specialty:  Physician Assistant   Contact information:   [address] [place] [address] [phone]       Follow up with Cristopher Peru, MD. (office will call you)    Specialty:  Cardiology   Contact information:   [address] [place] [address] [phone]       Follow up with [name], MD.   Specialty:  [facility] Medicine   Contact information:   [address] [place] [address] [phone]       Follow up with Eldridge Abrahams, MD. (Office will call with appointment date and time  (3 months follow up))    Specialty:  Vascular Surgery   Contact information:   [place] [address] [phone]      Significant Diagnostic Studies: Dg Chest Portable 1 View  01/16/2014   CLINICAL DATA:  Chest pain and shortness of breath.  EXAM: PORTABLE CHEST - 1 VIEW  COMPARISON:  Chest radiograph performed 09/05/2013  FINDINGS: The lungs are well-aerated. Interstitial opacities are again noted, with a peripheral predominance. This appears worsened from the prior study, with underlying vascular congestion. This may reflect pneumonia or pulmonary edema, superimposed on the patient's chronic interstitial lung disease. There is no evidence of pleural effusion or pneumothorax.  The cardiomediastinal silhouette is mildly enlarged. An external pacing pad is noted. No acute osseous abnormalities are seen.  IMPRESSION: Interstitial opacities are worsened from the prior study, with underlying vascular congestion and mild cardiomegaly. This may reflect pulmonary edema or pneumonia, superimposed on the patient's chronic interstitial lung disease.   Electronically Signed   By: Garald Balding M.D.   On: 01/16/2014 02:14    Microbiology: Recent Results (from the past 240 hour(s))  CULTURE, BLOOD (ROUTINE X 2)     Status: None   Collection Time    01/16/14  3:03 AM      Result Value Ref Range Status   Specimen Description BLOOD LEFT ARM   Final   Special Requests BOTTLES DRAWN AEROBIC AND ANAEROBIC 5CC   Final   Culture  Setup Time     Final   Value: 01/16/2014 08:02     Performed at [facility]   Culture     Final   Value:        BLOOD CULTURE RECEIVED NO GROWTH TO DATE CULTURE WILL BE HELD FOR 5 DAYS BEFORE ISSUING A FINAL NEGATIVE REPORT     Performed at [facility]   Report Status PENDING   Incomplete  CULTURE, BLOOD (ROUTINE X 2)     Status: None   Collection Time    01/16/14  3:13 AM      Result Value Ref Range Status   Specimen Description BLOOD RIGHT ARM    Final   Special Requests BOTTLES DRAWN AEROBIC AND ANAEROBIC 10CC   Final   Culture  Setup Time     Final   Value: 01/16/2014 08:02     Performed at [facility]   Culture     Final   Value:        BLOOD CULTURE RECEIVED NO GROWTH TO DATE CULTURE WILL BE HELD FOR 5 DAYS BEFORE ISSUING A FINAL NEGATIVE REPORT     Performed at [facility]   Report Status PENDING   Incomplete  MRSA PCR SCREENING  Status: None   Collection Time    01/16/14  6:41 AM      Result Value Ref Range Status   MRSA by PCR NEGATIVE  NEGATIVE Final   Comment:            The GeneXpert MRSA Assay (FDA     approved for NASAL specimens     only), is one component of a     comprehensive MRSA colonization     surveillance program. It is not     intended to diagnose MRSA     infection nor to guide or     monitor treatment for     MRSA infections.     Labs: Basic Metabolic Panel:  Recent Labs Lab 01/16/14 0303 01/17/14 0035 01/18/14 0329 01/19/14 0338 01/20/14 0324  NA 136* 139 140 140 140  K 4.7 4.0 4.0 3.9 4.0  CL 95* 99 100 102 101  CO2 26 28 25 25 26   GLUCOSE 145* 123* 107* 103* 101*  BUN 19 19 20 20 15   CREATININE 1.60* 1.30 1.48* 1.52* 1.37*  CALCIUM 11.1* 9.5 9.2 9.5 9.6  MG 1.9  --   --   --   --    Liver Function Tests:  Recent Labs Lab 01/16/14 0303  AST 23  ALT 19  ALKPHOS 79  BILITOT 0.3  PROT 8.8*  ALBUMIN 4.0   CBC:  Recent Labs Lab 01/16/14 0303 01/17/14 0035 01/18/14 0329  WBC 13.4* 6.9 7.4  NEUTROABS 10.0*  --   --   HGB 13.6 10.8* 11.1*  HCT 41.2 32.6* 33.1*  MCV 89.2 88.6 86.6  PLT 396 253 275   Cardiac Enzymes:  Recent Labs Lab 01/16/14 0453 01/16/14 1205 01/16/14 1851 01/17/14 0035  TROPONINI 2.04* 2.76* 2.48* 3.48*   BNP: BNP (last 3 results)  Recent Labs  01/16/14 0237  PROBNP 2047.0*   Signed:  ELLIS,ALLISON L. ANP [facility] 01/20/2014, 12:11 PM  I have personally examined this patient and reviewed the entire  database. I have reviewed the above note, made any necessary editorial changes, and agree with its content.  Cherene Altes, MD [facility]

## 2014-01-20 NOTE — Discharge Instructions (Signed)
Cardiac Ablation  Cardiac ablation is a procedure to disable a small amount of heart tissue in very specific places. The heart has many electrical connections. Sometimes these connections are abnormal and can cause the heart to beat very fast or irregularly. By disabling some of the problem areas, heart rhythm can be improved or made normal. Ablation is done for people who:   · Have Wolff-Parkinson-White syndrome.    · Have other fast heart rhythms (tachycardia).    · Have taken medicines for an abnormal heart rhythm (arrhythmia) that resulted in:    ¨ No success.    ¨ Side effects.    · May have a high-risk heartbeat that could result in death.    LET YOUR HEALTH CARE PROVIDER KNOW ABOUT:   · Any allergies you have or any previous reactions you have had to X-ray dye, food (such as seafood), medicine, or tape.    · All medicines you are taking, including vitamins, herbs, eye drops, creams, and over-the-counter medicines.    · Previous problems you or members of your family have had with the use of anesthetics.    · Any blood disorders you have.    · Previous surgeries or procedures (such as a kidney transplant) you have had.    · Medical conditions you have (such as kidney failure).    RISKS AND COMPLICATIONS  Generally, cardiac ablation is a safe procedure. However, problems can occur and include:   · Increased risk of cancer. Depending on how long it takes to do the ablation, the dose of radiation can be high.   · Bruising and bleeding where a thin, flexible tube (catheter) was inserted during the procedure.    · Bleeding into the chest, especially into the sac that surrounds the heart (serious).  · Need for a permanent pacemaker if the normal electrical system is damaged.    · The procedure may not be fully effective, and this may not be recognized for months. Repeat ablation procedures are sometimes required.  BEFORE THE PROCEDURE   · Follow any instructions from your health care provider regarding eating and  drinking before the procedure.    · Take your medicines as directed at regular times with water, unless instructed otherwise by your health care provider. If you are taking diabetes medicine, including insulin, ask how you are to take it and if there are any special instructions you should follow. It is common to adjust insulin dosing the day of the ablation.    PROCEDURE  · An ablation is usually performed in a catheterization laboratory with the guidance of fluoroscopy. Fluoroscopy is a type of X-ray that helps your health care provider see images of your heart during the procedure.    · An ablation is a minimally invasive procedure. This means a small cut (incision) is made in either your neck or groin. Your health care provider will decide where to make the incision based on your medical history and physical exam.  · An IV tube will be started before the procedure begins. You will be given an anesthetic or medicine to help you relax (sedative).  · The skin on your neck or groin will be numbed. A needle will be inserted into a large vein in your neck or groin and catheters will be threaded to your heart.  · A special dye that shows up on fluoroscopy pictures may be injected through the catheter. The dye helps your health care provider see the area of the heart that needs treatment.  · The catheter has electrodes on the tip. When the area of heart   ablate the heart tissue:   Heat (radiofrequency energy).   Laser energy.   Extreme cold (cryoablation).   When the area of the heart has been ablated, the catheter will be taken out. Pressure will be held on the insertion site. This will help the insertion site clot and keep it from bleeding. A bandage will be placed on the insertion site.  AFTER THE PROCEDURE   After the procedure, you  will be taken to a recovery area where your vital signs (blood pressure, heart rate, and breathing) will be monitored. The insertion site will also be monitored for bleeding.   You will need to lie still for 4-6 hours. This is to ensure you do not bleed from the catheter insertion site.  Document Released: 09/07/2008 Document Revised: 09/05/2013 Document Reviewed: 09/13/2012 [facility] Patient Information 2015 [place], [place]. This information is not intended to replace advice given to you by your health care provider. Make sure you discuss any questions you have with your health care provider. Coronary Angiogram with Stent Coronary angiography with stent placement is a procedure to widen or open a narrow blood vessel of the heart (coronary artery). When a coronary artery becomes partially blocked, it decreases blood flow to that area. This may lead to chest pain or a heart attack (myocardial infarction). Arteries may become blocked by cholesterol buildup (plaque) in the lining or wall.  A stent is a small piece of metal that looks like a mesh or a spring. Stent placement may be done right after a coronary angiography in which a blocked artery is found or as a treatment for a heart attack.  LET [facility] CARE PROVIDER KNOW ABOUT:  Any allergies you have.   All medicines you are taking, including vitamins, herbs, eye drops, creams, and over-the-counter medicines.   Previous problems you or members of your family have had with the use of anesthetics.   Any blood disorders you have.   Previous surgeries you have had.   Medical conditions you have. RISKS AND COMPLICATIONS Generally, coronary angiography with stent is a safe procedure. However, problems can occur and include:  Damage to the heart or its blood vessels.   A return of blockage.   Bleeding, infection, or bruising at the insertion site.   A collection of blood under the skin (hematoma) at the insertion site.  Blood  clot in another part of the body.   Kidney injury.   Allergic reaction to the dye or contrast used.   Bleeding into the abdomen (retroperitoneal bleeding). BEFORE THE PROCEDURE  Do not eat or drink anything after midnight on the night before the procedure or as directed by your health care provider.  Ask your health care provider about changing or stopping your regular medicines. This is especially important if you are taking diabetes medicines or blood thinners.  Your health care provider will make sure you understand the procedure as well as the risks and potential problems associated with the procedure.  PROCEDURE  You may be given a medicine to help you relax before and during the procedure (sedative). This medicine will be given through an IV tube that is put into one of your veins.   The area where the catheter will be inserted will be shaved and cleaned. This is usually done in the groin but may be done in the fold of your arm (near your elbow) or in the wrist.   A medicine will be given to numb the area where the catheter will  be inserted (local anesthetic).   The catheter will be inserted into an artery using a guide wire. A type of X-ray (fluoroscopy) will be used to help guide the catheter to the opening of the blocked artery.   A dye will then be injected into the catheter, and X-rays will be taken. The dye will help to show where any narrowing or blockages are located in the heart arteries.   A tiny wire will be guided to the blocked spot, and a balloon will be inflated to make the artery wider. The stent will be expanded and will crush the plaque into the wall of the vessel. The stent will hold the area open like a scaffolding and improve the blood flow.   Sometimes the artery may be made wider using a laser or other tools to remove plaque.   When the blood flow is better, the catheter will be removed. The lining of the artery will grow over the stent, which  stays where it was placed.  AFTER THE PROCEDURE  If the procedure is done through the leg, you will be kept in bed lying flat for about 6 hours. You will be instructed to not bend or cross your legs.   The insertion site will be checked frequently.   The pulse in your feet or wrist will be checked frequently.   Additional blood tests, X-rays, and electrocardiography may be done. Document Released: 10/26/2002 Document Revised: 09/05/2013 Document Reviewed: 10/28/2012 [facility] Patient Information 2015 [place], [place]. This information is not intended to replace advice given to you by your health care provider. Make sure you discuss any questions you have with your health care provider. Peripheral Vascular Disease Peripheral Vascular Disease (PVD), also called Peripheral Arterial Disease (PAD), is a circulation problem caused by cholesterol (atherosclerotic plaque) deposits in the arteries. PVD commonly occurs in the lower extremities (legs) but it can occur in other areas of the body, such as your arms. The cholesterol buildup in the arteries reduces blood flow which can cause pain and other serious problems. The presence of PVD can place a person at risk for Coronary Artery Disease (CAD).  CAUSES  Causes of PVD can be many. It is usually associated with more than one risk factor such as:   High Cholesterol.  Smoking.  Diabetes.  Lack of exercise or inactivity.  High blood pressure (hypertension).  Obesity.  Family history. SYMPTOMS   When the lower extremities are affected, patients with PVD may experience:  Leg pain with exertion or physical activity. This is called INTERMITTENT CLAUDICATION. This may present as cramping or numbness with physical activity. The location of the pain is associated with the level of blockage. For example, blockage at the abdominal level (distal abdominal aorta) may result in buttock or hip pain. Lower leg arterial blockage may result in calf  pain.  As PVD becomes more severe, pain can develop with less physical activity.  In people with severe PVD, leg pain may occur at rest.  Other PVD signs and symptoms:  Leg numbness or weakness.  Coldness in the affected leg or foot, especially when compared to the other leg.  A change in leg color.  Patients with significant PVD are more prone to ulcers or sores on toes, feet or legs. These may take longer to heal or may reoccur. The ulcers or sores can become infected.  If signs and symptoms of PVD are ignored, gangrene may occur. This can result in the loss of toes or loss of  an entire limb.  Not all leg pain is related to PVD. Other medical conditions can cause leg pain such as:  Blood clots (embolism) or Deep Vein Thrombosis.  Inflammation of the blood vessels (vasculitis).  Spinal stenosis. DIAGNOSIS  Diagnosis of PVD can involve several different types of tests. These can include:  Pulse Volume Recording Method (PVR). This test is simple, painless and does not involve the use of X-rays. PVR involves measuring and comparing the blood pressure in the arms and legs. An ABI (Ankle-Brachial Index) is calculated. The normal ratio of blood pressures is 1. As this number becomes smaller, it indicates more severe disease.  < 0.95 - indicates significant narrowing in one or more leg vessels.  <0.8 - there will usually be pain in the foot, leg or buttock with exercise.  <0.4 - will usually have pain in the legs at rest.  <0.25 - usually indicates limb threatening PVD.  Doppler detection of pulses in the legs. This test is painless and checks to see if you have a pulses in your legs/feet.  A dye or contrast material (a substance that highlights the blood vessels so they show up on x-ray) may be given to help your caregiver better see the arteries for the following tests. The dye is eliminated from your body by the kidney's. Your caregiver may order blood work to check your kidney  function and other laboratory values before the following tests are performed:  Magnetic Resonance Angiography (MRA). An MRA is a picture study of the blood vessels and arteries. The MRA machine uses a large magnet to produce images of the blood vessels.  Computed Tomography Angiography (CTA). A CTA is a specialized x-ray that looks at how the blood flows in your blood vessels. An IV may be inserted into your arm so contrast dye can be injected.  Angiogram. Is a procedure that uses x-rays to look at your blood vessels. This procedure is minimally invasive, meaning a small incision (cut) is made in your groin. A small tube (catheter) is then inserted into the artery of your groin. The catheter is guided to the blood vessel or artery your caregiver wants to examine. Contrast dye is injected into the catheter. X-rays are then taken of the blood vessel or artery. After the images are obtained, the catheter is taken out. TREATMENT  Treatment of PVD involves many interventions which may include:  Lifestyle changes:  Quitting smoking.  Exercise.  Following a low fat, low cholesterol diet.  Control of diabetes.  Foot care is very important to the PVD patient. Good foot care can help prevent infection.  Medication:  Cholesterol-lowering medicine.  Blood pressure medicine.  Anti-platelet drugs.  Certain medicines may reduce symptoms of Intermittent Claudication.  Interventional/Surgical options:  Angioplasty. An Angioplasty is a procedure that inflates a balloon in the blocked artery. This opens the blocked artery to improve blood flow.  Stent Implant. A wire mesh tube (stent) is placed in the artery. The stent expands and stays in place, allowing the artery to remain open.  Peripheral Bypass Surgery. This is a surgical procedure that reroutes the blood around a blocked artery to help improve blood flow. This type of procedure may be performed if Angioplasty or stent implants are not an  option. SEEK IMMEDIATE MEDICAL CARE IF:   You develop pain or numbness in your arms or legs.  Your arm or leg turns cold, becomes blue in color.  You develop redness, warmth, swelling and pain in your arms  or legs. MAKE SURE YOU:   Understand these instructions.  Will watch your condition.  Will get help right away if you are not doing well or get worse. Document Released: 05/29/2004 Document Revised: 07/14/2011 Document Reviewed: 04/25/2008 [facility] Patient Information 2015 [place], [place]. This information is not intended to replace advice given to you by your health care provider. Make sure you discuss any questions you have with your health care provider. Ticagrelor oral tablet What is this medicine? TICAGRELOR (TYE ka GREL or) helps to prevent blood clots. This medicine is used to prevent heart attack, stroke, or other vascular events in people who have had a recent heart attack or who have severe chest pain. This medicine may be used for other purposes; ask your health care provider or pharmacist if you have questions. COMMON BRAND NAME(S): BRILINTA What should I tell my health care provider before I take this medicine? They need to know if you have any of these conditions: -bleeding disorder -bleeding in the brain -liver disease -planned surgery -stomach or intestinal ulcers -stroke or transient ischemic attack -an unusual or allergic reaction to ticagrelor, other medicines, foods, dyes, or preservatives -pregnant or trying to get pregnant -breast-feeding How should I use this medicine? Take this medicine by mouth with a glass of water. Follow the directions on the prescription label. You can take it with or without food. If it upsets your stomach, take it with food. Take your medicine at regular intervals. Do not take it more often than directed. Do not stop taking except on your doctor's advice. Talk to you pediatrician regarding the use of this medicine in children.  Special care may be needed. Overdosage: If you think you've taken too much of this medicine contact a poison control center or emergency room at once. Overdosage: If you think you have taken too much of this medicine contact a poison control center or emergency room at once. NOTE: This medicine is only for you. Do not share this medicine with others. What if I miss a dose? If you miss a dose, take it as soon as you can. If it is almost time for your next dose, take only that dose. Do not take double or extra doses. What may interact with this medicine? -certain antibiotics like clarithromycin and telithromycin -certain medicines for fungal infections like itraconazole, ketoconazole, and voriconazole -certain medicines for HIV infection like atazanavir, indinavir, nelfinavir, ritonavir, and saquinavir -certain medicines for seizures like carbamazepine, phenobarbital, and phenytoin -certain medicines that treat or prevent blood clots like warfarin -dexamethasone -digoxin -lovastatin -nefazodone -rifampin -simvastatin This list may not describe all possible interactions. Give your health care provider a list of all the medicines, herbs, non-prescription drugs, or dietary supplements you use. Also tell them if you smoke, drink alcohol, or use illegal drugs. Some items may interact with your medicine. What should I watch for while using this medicine? Visit your doctor or health care professional for regular check ups. Do not stop taking you medicine unless your doctor tells you to. Notify your doctor or health care professional and seek emergency treatment if you develop breathing problems; changes in vision; chest pain; severe, sudden headache; pain, swelling, warmth in the leg; trouble speaking; sudden numbness or weakness of the face, arm, or leg. These can be signs that your condition has gotten worse. If you are going to have surgery or dental work, tell your doctor or health care professional  that you are taking this medicine. You should take aspirin every day  with this medicine. Do not take more than 100 mg each day. Talk to your doctor if you have questions. What side effects may I notice from receiving this medicine? Side effects that you should report to your doctor or health care professional as soon as possible: -allergic reactions like skin rash, itching or hives, swelling of the face, lips, or tongue -breathing problems -fast or irregular heartbeat -feeling faint or light-headed, falls -signs and symptoms of bleeding such as bloody or black, tarry stools; red or dark-brown urine; spitting up blood or brown material that looks like coffee grounds; red spots on the skin; unusual bruising or bleeding from the eye, gums, or nose Side effects that usually do not require medical attention (Report these to your doctor or health care professional if they continue or are bothersome.): -breast enlargement in both males and females -diarrhea -dizziness -headache -tiredness -upset stomach This list may not describe all possible side effects. Call your doctor for medical advice about side effects. You may report side effects to FDA at 1-800-FDA-1088. Where should I keep my medicine? Keep out of the reach of children. Store at room temperature of 59 to 86 degrees F (15 to 30 degrees C). Throw away any unused medicine after the expiration date. NOTE: This sheet is a summary. It may not cover all possible information. If you have questions about this medicine, talk to your doctor, pharmacist, or health care provider.  2015, [employer]. (2013-08-01 08:31:23) Coronary Angiogram with Stent Coronary angiography with stent placement is a procedure to widen or open a narrow blood vessel of the heart (coronary artery). When a coronary artery becomes partially blocked, it decreases blood flow to that area. This may lead to chest pain or a heart attack (myocardial infarction). Arteries  may become blocked by cholesterol buildup (plaque) in the lining or wall.  A stent is a small piece of metal that looks like a mesh or a spring. Stent placement may be done right after a coronary angiography in which a blocked artery is found or as a treatment for a heart attack.  LET [facility] CARE PROVIDER KNOW ABOUT:  Any allergies you have.   All medicines you are taking, including vitamins, herbs, eye drops, creams, and over-the-counter medicines.   Previous problems you or members of your family have had with the use of anesthetics.   Any blood disorders you have.   Previous surgeries you have had.   Medical conditions you have. RISKS AND COMPLICATIONS Generally, coronary angiography with stent is a safe procedure. However, problems can occur and include:  Damage to the heart or its blood vessels.   A return of blockage.   Bleeding, infection, or bruising at the insertion site.   A collection of blood under the skin (hematoma) at the insertion site.  Blood clot in another part of the body.   Kidney injury.   Allergic reaction to the dye or contrast used.   Bleeding into the abdomen (retroperitoneal bleeding). BEFORE THE PROCEDURE  Do not eat or drink anything after midnight on the night before the procedure or as directed by your health care provider.  Ask your health care provider about changing or stopping your regular medicines. This is especially important if you are taking diabetes medicines or blood thinners.  Your health care provider will make sure you understand the procedure as well as the risks and potential problems associated with the procedure.  PROCEDURE  You may be given a medicine to help you  relax before and during the procedure (sedative). This medicine will be given through an IV tube that is put into one of your veins.   The area where the catheter will be inserted will be shaved and cleaned. This is usually done in the groin but  may be done in the fold of your arm (near your elbow) or in the wrist.   A medicine will be given to numb the area where the catheter will be inserted (local anesthetic).   The catheter will be inserted into an artery using a guide wire. A type of X-ray (fluoroscopy) will be used to help guide the catheter to the opening of the blocked artery.   A dye will then be injected into the catheter, and X-rays will be taken. The dye will help to show where any narrowing or blockages are located in the heart arteries.   A tiny wire will be guided to the blocked spot, and a balloon will be inflated to make the artery wider. The stent will be expanded and will crush the plaque into the wall of the vessel. The stent will hold the area open like a scaffolding and improve the blood flow.   Sometimes the artery may be made wider using a laser or other tools to remove plaque.   When the blood flow is better, the catheter will be removed. The lining of the artery will grow over the stent, which stays where it was placed.  AFTER THE PROCEDURE  If the procedure is done through the leg, you will be kept in bed lying flat for about 6 hours. You will be instructed to not bend or cross your legs.   The insertion site will be checked frequently.   The pulse in your feet or wrist will be checked frequently.   Additional blood tests, X-rays, and electrocardiography may be done. Document Released: 10/26/2002 Document Revised: 09/05/2013 Document Reviewed: 10/28/2012 [facility] Patient Information 2015 [place], [place]. This information is not intended to replace advice given to you by your health care provider. Make sure you discuss any questions you have with your health care provider. Cardiac Ablation Cardiac ablation is a procedure to disable a small amount of heart tissue in very specific places. The heart has many electrical connections. Sometimes these connections are abnormal and can cause the heart to  beat very fast or irregularly. By disabling some of the problem areas, heart rhythm can be improved or made normal. Ablation is done for people who:   Have Wolff-Parkinson-White syndrome.   Have other fast heart rhythms (tachycardia).   Have taken medicines for an abnormal heart rhythm (arrhythmia) that resulted in:   No success.   Side effects.   May have a high-risk heartbeat that could result in death.  LET [facility] CARE PROVIDER KNOW ABOUT:   Any allergies you have or any previous reactions you have had to X-ray dye, food (such as seafood), medicine, or tape.   All medicines you are taking, including vitamins, herbs, eye drops, creams, and over-the-counter medicines.   Previous problems you or members of your family have had with the use of anesthetics.   Any blood disorders you have.   Previous surgeries or procedures (such as a kidney transplant) you have had.   Medical conditions you have (such as kidney failure).  RISKS AND COMPLICATIONS Generally, cardiac ablation is a safe procedure. However, problems can occur and include:   Increased risk of cancer. Depending on how long it takes to do  the ablation, the dose of radiation can be high.  Bruising and bleeding where a thin, flexible tube (catheter) was inserted during the procedure.   Bleeding into the chest, especially into the sac that surrounds the heart (serious).  Need for a permanent pacemaker if the normal electrical system is damaged.   The procedure may not be fully effective, and this may not be recognized for months. Repeat ablation procedures are sometimes required. BEFORE THE PROCEDURE   Follow any instructions from your health care provider regarding eating and drinking before the procedure.   Take your medicines as directed at regular times with water, unless instructed otherwise by your health care provider. If you are taking diabetes medicine, including insulin, ask how you are to  take it and if there are any special instructions you should follow. It is common to adjust insulin dosing the day of the ablation.  PROCEDURE  An ablation is usually performed in a catheterization laboratory with the guidance of fluoroscopy. Fluoroscopy is a type of X-ray that helps your health care provider see images of your heart during the procedure.   An ablation is a minimally invasive procedure. This means a small cut (incision) is made in either your neck or groin. Your health care provider will decide where to make the incision based on your medical history and physical exam.  An IV tube will be started before the procedure begins. You will be given an anesthetic or medicine to help you relax (sedative).  The skin on your neck or groin will be numbed. A needle will be inserted into a large vein in your neck or groin and catheters will be threaded to your heart.  A special dye that shows up on fluoroscopy pictures may be injected through the catheter. The dye helps your health care provider see the area of the heart that needs treatment.  The catheter has electrodes on the tip. When the area of heart tissue that is causing the arrhythmia is found, the catheter tip will send an electrical current to the area and "scar" the tissue. Three types of energy can be used to ablate the heart tissue:   Heat (radiofrequency energy).   Laser energy.   Extreme cold (cryoablation).   When the area of the heart has been ablated, the catheter will be taken out. Pressure will be held on the insertion site. This will help the insertion site clot and keep it from bleeding. A bandage will be placed on the insertion site.  AFTER THE PROCEDURE   After the procedure, you will be taken to a recovery area where your vital signs (blood pressure, heart rate, and breathing) will be monitored. The insertion site will also be monitored for bleeding.   You will need to lie still for 4-6 hours. This is  to ensure you do not bleed from the catheter insertion site.  Document Released: 09/07/2008 Document Revised: 09/05/2013 Document Reviewed: 09/13/2012 [facility] Patient Information 2015 [place], [place]. This information is not intended to replace advice given to you by your health care provider. Make sure you discuss any questions you have with your health care provider. Supraventricular Tachycardia Supraventricular tachycardia (SVT) is an abnormal heart rhythm (arrhythmia) that causes the heart to beat very fast (tachycardia). This kind of fast heartbeat originates in the upper chambers of the heart (atria). SVT can cause the heart to beat greater than 100 beats per minute. SVT can have a rapid burst of heartbeats. This can start and stop suddenly without  warning and is called nonsustained. SVT can also be sustained, in which the heart beats at a continuous fast rate.  CAUSES  There can be different causes of SVT. Some of these include:  Heart valve problems such as mitral valve prolapse.  An enlarged heart (hypertrophic cardiomyopathy).  Congenital heart problems.  Heart inflammation (pericarditis).  Hyperthyroidism.  Low potassium or magnesium levels.  Caffeine.  Drug use such as cocaine, methamphetamines, or stimulants.  Some over-the-counter medicines such as:  Decongestants.  Diet medicines.  Herbal medicines. SYMPTOMS  Symptoms of SVT can vary. Symptoms depend on whether the SVT is sustained or nonsustained. You may experience:  No symptoms (asymptomatic).  An awareness of your heart beating rapidly (palpitations).  Shortness of breath.  Chest pain or pressure. If your blood pressure drops because of the SVT, you may experience:  Fainting or near fainting.  Weakness.  Dizziness. DIAGNOSIS  Different tests can be performed to diagnose SVT, such as:  An electrocardiogram (EKG). This is a painless test that records the electrical activity of your  heart.  Holter monitor. This is a 24 hour recording of your heart rhythm. You will be given a diary. Write down all symptoms that you have and what you were doing at the time you experienced symptoms.  Arrhythmia monitor. This is a small device that your wear for several weeks. It records the heart rhythm when you have symptoms.  Echocardiogram. This is an imaging test to help detect abnormal heart structure such as congenital abnormalities, heart valve problems, or heart enlargement.  Stress test. This test can help determine if the SVT is related to exercise.  Electrophysiology study (EPS). This is a procedure that evaluates your heart's electrical system and can help your caregiver find the cause of your SVT. TREATMENT  Treatment of SVT depends on the symptoms, how often it recurs, and whether there are any underlying heart problems.   If symptoms are rare and no other cardiac disease is present, no treatment may be needed.  Blood work may be done to check potassium, magnesium, and thyroid hormone levels to see if they are abnormal. If these levels are abnormal, treatment to correct the problems will occur. Medicines Your caregiver may use oral medicines to treat SVT. These medicines are given for long-term control of SVT. Medicines may be used alone or in combination with other treatments. These medicines work to slow nerve impulses in the heart muscle. These medicines can also be used to treat high blood pressure. Some of these medicines may include:  Calcium channel blockers.  Beta blockers.  Digoxin. Nonsurgical procedures Nonsurgical techniques may be used if oral medicines do not work. Some examples include:  Cardioversion. This technique uses either drugs or an electrical shock to restore a normal heart rhythm.  Cardioversion drugs may be given through an intravenous (IV) line to help "reset" the heart rhythm.  In electrical cardioversion, the caregiver shocks your heart to  stop its beat for a split second. This helps to reset the heart to a normal rhythm.  Ablation. This procedure is done under mild sedation. High frequency radio wave energy is used to destroy the area of heart tissue responsible for the SVT. HOME CARE INSTRUCTIONS   Do not smoke.  Only take medicines prescribed by your caregiver. Check with your caregiver before using over-the-counter medicines.  Check with your caregiver about how much alcohol and caffeine (coffee, tea, colas, or chocolate) you may have.  It is very important to keep all  follow-up referrals and appointments in order to properly manage this problem. SEEK IMMEDIATE MEDICAL CARE IF:  You have dizziness.  You faint or nearly faint.  You have shortness of breath.  You have chest pain or pressure.  You have sudden nausea or vomiting.  You have profuse sweating.  You are concerned about how long your symptoms last.  You are concerned about the frequency of your SVT episodes. If you have the above symptoms, call your local emergency services (911 in [place] immediately. Do not drive yourself to the hospital. MAKE SURE YOU:   Understand these instructions.  Will watch your condition.  Will get help right away if you are not doing well or get worse. Document Released: 04/21/2005 Document Revised: 07/14/2011 Document Reviewed: 08/03/2008 [facility] Patient Information 2015 [place], [place]. This information is not intended to replace advice given to you by your health care provider. Make sure you discuss any questions you have with your health care provider.

## 2014-01-20 NOTE — Progress Notes (Signed)
CARDIAC REHAB PHASE I   PRE:  Rate/Rhythm: 83 SR  BP:  Sitting: 162/86      SaO2: 96 RA  MODE:  Ambulation: 200 ft   POST:  Rate/Rhythm: 90 SR  BP:  Sitting: 152/64     SaO2: 96 RA  1105-1120 Patient ambulated in hallway x 1 assist. Unsteady gait noted when his knee "buckles". Patient stated he has walker at home but doesn't use it. After walking with patient, RN encouraged him to use his walker at home for safety when he doesn't have someone to steady him. Patient denied complaints during ambulation. Post walk patient back to recliner with call bell in reach. Patient to be discharged home this afternoon. Denied further need for education reinforcement.   Santina Evans, BSN 01/20/2014 11:19 AM

## 2014-01-20 NOTE — Evaluation (Signed)
Occupational Therapy Evaluation Patient Details Name: Curtis [name] MRN: [id number] DOB: 12/27/1945 Today's Date: 01/20/2014    History of Present Illness Patient is a 68 y/o male admitted with chest pain and SOB. (+) troponins. Admitted with SVT with rates in the 150s s/p cardioversion. S/p L cardiac cath 9/15. PMH of hypertension, diastolic dysfunction, COPD with pulmonary asbestosis and 2 tiny nonruptured cerebral aneurysms.   Clinical Impression   Pt at sup level with ADLs and ADL mobility. Al  Education competed and no further acute OT services indicated st this time    Follow Up Recommendations  No OT follow up    Equipment Recommendations  None recommended by OT    Recommendations for Other Services       Precautions / Restrictions Precautions Precautions: None Restrictions Weight Bearing Restrictions: No      Mobility Bed Mobility               General bed mobility comments: Pt in recliner upon arrival  Transfers Overall transfer level: Needs assistance Equipment used: None Transfers: Sit to/from Stand Sit to Stand: Supervision         General transfer comment: Supervision for safety. Ambulated to bathroom and able to stand from toilet without use of grab bars for assist to simulate home environment (pt built handles on each side of toilet to use if needed).    Balance Overall balance assessment: No apparent balance deficits (not formally assessed)   Sitting balance-Leahy Scale: Good       Standing balance-Leahy Scale: Good                              ADL Overall ADL's : Needs assistance/impaired     Grooming: Wash/dry hands;Wash/dry face;Supervision/safety;Standing   Upper Body Bathing: Supervision/ safety;Standing   Lower Body Bathing: Sit to/from stand;Supervison/ safety   Upper Body Dressing : Supervision/safety;Standing   Lower Body Dressing: Supervision/safety;Sit to/from stand   Toilet Transfer:  Sales executive;Ambulation;Grab bars   Toileting- Clothing Manipulation and Hygiene: Supervision/safety;Sit to/from stand   Tub/ Banker: Supervision/safety;Grab bars   Functional mobility during ADLs: Supervision/safety       Vision  reading glasses                   Perception Perception Perception Tested?: No   Praxis Praxis Praxis tested?: Not tested    Pertinent Vitals/Pain Pain Assessment: No/denies pain     Hand Dominance Right   Extremity/Trunk Assessment Upper Extremity Assessment Upper Extremity Assessment: Overall WFL for tasks assessed   Lower Extremity Assessment Lower Extremity Assessment: Defer to PT evaluation   Cervical / Trunk Assessment Cervical / Trunk Assessment: Normal   Communication Communication Communication: No difficulties   Cognition Arousal/Alertness: Awake/alert Behavior During Therapy: WFL for tasks assessed/performed Overall Cognitive Status: Within Functional Limits for tasks assessed                     General Comments   pt very pleasant and cooperative                 Home Living Family/patient expects to be discharged to:: Private residence Living Arrangements: Alone Available Help at Discharge: Family;Available PRN/intermittently Type of Home: House Home Access: Stairs to enter;Ramped entrance Entrance Stairs-Number of Steps: 3 Entrance Stairs-Rails: Right Home Layout: One level     Bathroom Shower/Tub: Walk-in shower;Door   [employer] Toilet: Standard     Home Equipment:  Walker - 2 wheels;Cane - quad;Cane - single point   Additional Comments: . 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 Son lives next door and helps if needed    OT Diagnosis:     OT  Problem List:     OT Treatment/Interventions:      OT Goals(Current goals can be found in the care plan section) Acute Rehab OT Goals Patient Stated Goal: to go home as soon as possible. OT Goal Formulation: With patient  OT Frequency:     Barriers to D/C:  none                        End of Session    Activity Tolerance: Patient tolerated treatment well Patient left: with call bell/phone within reach;in chair   Time: 2683-4196 OT Time Calculation (min): 28 min Charges:  OT General Charges $OT Visit: 1 Procedure OT Evaluation $Initial OT Evaluation Tier I: 1 Procedure OT Treatments $Therapeutic Activity: 8-22 mins G-Codes:    Britt Bottom 01/20/2014, 12:39 PM

## 2014-01-22 LAB — CULTURE, BLOOD (ROUTINE X 2)
CULTURE: NO GROWTH
Culture: NO GROWTH

## 2014-01-28 ENCOUNTER — Emergency Department (HOSPITAL_COMMUNITY): Payer: Medicare Other

## 2014-01-28 ENCOUNTER — Emergency Department (HOSPITAL_COMMUNITY)
Admission: EM | Admit: 2014-01-28 | Discharge: 2014-01-28 | Disposition: A | Discharge status: Home / Self Care | Payer: Medicare Other | Attending: Emergency Medicine | Admitting: Emergency Medicine

## 2014-01-28 ENCOUNTER — Encounter (HOSPITAL_COMMUNITY): Payer: Self-pay | Admitting: Emergency Medicine

## 2014-01-28 DIAGNOSIS — Z87891 Personal history of nicotine dependence: Secondary | ICD-10-CM | POA: Insufficient documentation

## 2014-01-28 DIAGNOSIS — F3289 Other specified depressive episodes: Secondary | ICD-10-CM | POA: Insufficient documentation

## 2014-01-28 DIAGNOSIS — R0602 Shortness of breath: Secondary | ICD-10-CM | POA: Diagnosis present

## 2014-01-28 DIAGNOSIS — F329 Major depressive disorder, single episode, unspecified: Secondary | ICD-10-CM | POA: Insufficient documentation

## 2014-01-28 DIAGNOSIS — G9050 Complex regional pain syndrome I, unspecified: Secondary | ICD-10-CM | POA: Insufficient documentation

## 2014-01-28 DIAGNOSIS — K219 Gastro-esophageal reflux disease without esophagitis: Secondary | ICD-10-CM | POA: Insufficient documentation

## 2014-01-28 DIAGNOSIS — J40 Bronchitis, not specified as acute or chronic: Secondary | ICD-10-CM | POA: Diagnosis not present

## 2014-01-28 DIAGNOSIS — IMO0002 Reserved for concepts with insufficient information to code with codable children: Secondary | ICD-10-CM | POA: Insufficient documentation

## 2014-01-28 DIAGNOSIS — Z79899 Other long term (current) drug therapy: Secondary | ICD-10-CM | POA: Insufficient documentation

## 2014-01-28 DIAGNOSIS — Z7982 Long term (current) use of aspirin: Secondary | ICD-10-CM | POA: Insufficient documentation

## 2014-01-28 DIAGNOSIS — M129 Arthropathy, unspecified: Secondary | ICD-10-CM | POA: Insufficient documentation

## 2014-01-28 DIAGNOSIS — I10 Essential (primary) hypertension: Secondary | ICD-10-CM | POA: Diagnosis not present

## 2014-01-28 LAB — BASIC METABOLIC PANEL
Anion gap: 14 (ref 5–15)
BUN: 13 mg/dL (ref 6–23)
CALCIUM: 10.1 mg/dL (ref 8.4–10.5)
CO2: 25 meq/L (ref 19–32)
Chloride: 98 mEq/L (ref 96–112)
Creatinine, Ser: 1.24 mg/dL (ref 0.50–1.35)
GFR calc Af Amer: 67 mL/min — ABNORMAL LOW (ref >90)
GFR calc non Af Amer: 58 mL/min — ABNORMAL LOW (ref >90)
GLUCOSE: 107 mg/dL — AB (ref 70–99)
Potassium: 4.8 mEq/L (ref 3.7–5.3)
SODIUM: 137 meq/L (ref 137–147)

## 2014-01-28 LAB — CBC
HEMATOCRIT: 33.7 % — AB (ref 39.0–52.0)
HEMOGLOBIN: 11.4 g/dL — AB (ref 13.0–17.0)
MCH: 29.2 pg (ref 26.0–34.0)
MCHC: 33.8 g/dL (ref 30.0–36.0)
MCV: 86.2 fL (ref 78.0–100.0)
Platelets: 346 10*3/uL (ref 150–400)
RBC: 3.91 MIL/uL — ABNORMAL LOW (ref 4.22–5.81)
RDW: 13.9 % (ref 11.5–15.5)
WBC: 7 10*3/uL (ref 4.0–10.5)

## 2014-01-28 LAB — PRO B NATRIURETIC PEPTIDE: Pro B Natriuretic peptide (BNP): 336.5 pg/mL — ABNORMAL HIGH (ref 0–125)

## 2014-01-28 LAB — I-STAT TROPONIN, ED: Troponin i, poc: 0 ng/mL (ref 0.00–0.08)

## 2014-01-28 LAB — D-DIMER, QUANTITATIVE (NOT AT ARMC): D DIMER QUANT: 0.43 ug{FEU}/mL (ref 0.00–0.48)

## 2014-01-28 MED ORDER — PREDNISONE 20 MG PO TABS
60.0000 mg | ORAL_TABLET | Freq: Once | ORAL | Status: AC
  Administered 2014-01-28: 60 mg via ORAL
  Filled 2014-01-28: qty 3

## 2014-01-28 MED ORDER — PREDNISONE 20 MG PO TABS: 20.0000 mg | ORAL_TABLET | Freq: Two times a day (BID) | ORAL | Status: DC

## 2014-01-28 NOTE — ED Notes (Signed)
Pt states that he began having SOB last night and it has progressively worsened today especially with movement . Pt states that he has tightness in his upper chest as well. Pt a&o4.

## 2014-01-28 NOTE — Discharge Instructions (Signed)

## 2014-01-28 NOTE — ED Provider Notes (Signed)
CSN: [id number]     Arrival date & time 01/28/14  1815 History   First MD Initiated Contact with Patient 01/28/14 Tower     Chief Complaint  Patient presents with  . Shortness of Breath     (Consider location/radiation/quality/duration/timing/severity/associated sxs/prior Treatment) HPI  Curtis [name] is a 68 y.o. male who states that he began having shortness of breath last night, which worsened today. He also complains of chest tightness. He is taking his usual medications, without relief. He feels that his urinary output is down because he has an enlarged prostate. He denies nausea, vomiting, dysuria, urinary frequency, constipation, or diarrhea. There's been no anorexia. He was hospitalized recently with tachycardia, and fluid overload, and apparently had a cardiac catheterization with coronary stent placement.  There are no other known modifying factors.   Past Medical History  Diagnosis Date  . Arthritis   . SOB (shortness of breath)   . Pulmonary asbestosis   . Hernia of unspecified site of abdominal cavity without mention of obstruction or gangrene     hiatal  . GERD (gastroesophageal reflux disease)   . Depression   . Diverticulitis     hospital 2011 [facility]  . Diverticulosis   . Reflex sympathetic dystrophy   . 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 Hypertension   . IBS (irritable bowel syndrome)    Past Surgical History  Procedure Laterality Date  . Nissen fundoplication    . Right elbow surgery      x 2  . Right knee arthroscopy    . Left shoulder       x 3  . Squamous cell skin cancer      Left Hand  . Cataract extraction     Family History  Problem Relation Age of Onset  . Colon cancer Mother   . Colon cancer Maternal Aunt   . Colon cancer Maternal Uncle    History  Substance Use Topics  . Smoking status: Former Smoker -- 2.50 packs/day for 50 years    Types: Cigarettes    Quit date: 05/05/2005  . Smokeless tobacco: Former Systems developer    Quit date: 05/03/2012  . Alcohol  Use: No     Comment: last use 2013    Review of Systems  All other systems reviewed and are negative.     Allergies  Codeine  Home Medications   Prior to Admission medications   Medication Sig Start Date End Date Taking? Authorizing Provider  alprazolam [name]) 2 MG tablet Take 2 mg by mouth 2 (two) times daily.   Yes Historical Provider, MD  aspirin 81 MG chewable tablet Chew 1 tablet (81 mg total) by mouth daily. 01/20/14  Yes [name], NP  docusate sodium (COLACE) 100 MG capsule Take 100 mg by mouth 2 (two) times daily as needed for mild constipation.   Yes Historical Provider, MD  finasteride (PROSCAR) 5 MG tablet Take 5 mg by mouth every morning.    Yes Historical Provider, MD  gabapentin (NEURONTIN) 300 MG capsule Take 300 mg by mouth 3 (three) times daily.    Yes Historical Provider, MD  metoprolol (LOPRESSOR) 50 MG tablet Take 25 mg by mouth 2 (two) times daily.   Yes Historical Provider, MD  nortriptyline (PAMELOR) 50 MG capsule Take 50 mg by mouth at bedtime.   Yes Historical Provider, MD  omeprazole (PRILOSEC) 20 MG capsule Take 20 mg by mouth daily.   Yes Historical Provider, MD  simvastatin (ZOCOR) 20 MG tablet Take 1  tablet (20 mg total) by mouth at bedtime. 09/08/13  Yes [name], MD  tamsulosin (FLOMAX) 0.4 MG CAPS capsule Take 1 capsule (0.4 mg total) by mouth daily. 09/08/13  Yes [name], MD  ticagrelor (BRILINTA) 90 MG TABS tablet Take 1 tablet (90 mg total) by mouth 2 (two) times daily. 01/18/14  Yes [name], NP  predniSONE (DELTASONE) 20 MG tablet Take 1 tablet (20 mg total) by mouth 2 (two) times daily. 01/28/14   Richarda Blade, MD   BP 173/68  Pulse 82  Temp(Src) 97.6 F (36.4 C) (Oral)  Resp 18  SpO2 100% Physical Exam  Nursing note and vitals reviewed. Constitutional: He is oriented to person, place, and time. He appears well-developed and well-nourished.  HENT:  Head: Normocephalic and atraumatic.  Right Ear: External ear  normal.  Left Ear: External ear normal.  Eyes: Conjunctivae and EOM are normal. Pupils are equal, round, and reactive to light.  Neck: Normal range of motion and phonation normal. Neck supple.  Cardiovascular: Normal rate, regular rhythm and normal heart sounds.   Pulmonary/Chest: Effort normal and breath sounds normal. He exhibits no bony tenderness.  Abdominal: Soft. There is no tenderness.  Musculoskeletal: Normal range of motion. He exhibits no edema and no tenderness.  Neurological: He is alert and oriented to person, place, and time. No cranial nerve deficit or sensory deficit. He exhibits normal muscle tone. Coordination normal.  Skin: Skin is warm, dry and intact.  Psychiatric: He has a normal mood and affect. His behavior is normal. Judgment and thought content normal.    ED Course  Procedures (including critical care time)  Medications  predniSONE (DELTASONE) tablet 60 mg (not administered)    Patient Vitals for the past 24 hrs:  BP Temp Temp src Pulse Resp SpO2  01/28/14 2030 173/68 mmHg - - 82 - 100 %  01/28/14 2012 151/83 mmHg - - 85 - 100 %  01/28/14 2000 - - - 85 - 99 %  01/28/14 1945 - - - 83 18 99 %  01/28/14 1930 - - - 84 21 98 %  01/28/14 1915 - - - 84 16 98 %  01/28/14 1900 - - - 87 22 97 %  01/28/14 1845 - - - 84 16 99 %  01/28/14 1825 139/76 mmHg 97.6 F (36.4 C) Oral 86 18 97 %    9:06 PM Reevaluation with update and discussion. After initial assessment and treatment, an updated evaluation reveals lungs with scattered wheezing, and good air movement. No rhonchi, or rales. Patient was offered albuterol inhaler, and declined. He, states the last time he used it " it tore my nerves, up."  Findings  discussed with patient and son, all questions answered.Daleen Bo L    Labs Review Labs Reviewed  CBC - Abnormal; Notable for the following:    RBC 3.91 (*)    Hemoglobin 11.4 (*)    HCT 33.7 (*)    All other components within normal limits  BASIC  METABOLIC PANEL - Abnormal; Notable for the following:    Glucose, Bld 107 (*)    GFR calc non Af Amer 58 (*)    GFR calc Af Amer 67 (*)    All other components within normal limits  PRO B NATRIURETIC PEPTIDE - Abnormal; Notable for the following:    Pro B Natriuretic peptide (BNP) 336.5 (*)    All other components within normal limits  D-DIMER, QUANTITATIVE  I-STAT TROPOININ, ED  Imaging Review Dg Chest Port 1 View  01/28/2014   CLINICAL DATA:  Shortness of breath.  EXAM: PORTABLE CHEST - 1 VIEW  COMPARISON:  January 16, 2014 ; Sep 05, 2013.  FINDINGS: Stable cardiomegaly. No pneumothorax is noted. Stable bilateral apical scarring is noted. Stable density is noted laterally in the right lung base which which is most consistent with scar. Stable left lateral basilar opacity is also noted concerning for scarring or loculated effusion.  IMPRESSION: Stable bilateral basilar opacities are noted most consistent with scarring and possibly loculated effusion on the left. No significant changes noted compared to prior exam.   Electronically Signed   By: Sabino Dick M.D.   On: 01/28/2014 19:14     EKG Interpretation   Date/Time:  Saturday January 28 2014 18:20:27 EDT Ventricular Rate:  89 PR Interval:  190 QRS Duration: 94 QT Interval:  356 QTC Calculation: 433 R Axis:   20 Text Interpretation:  Normal sinus rhythm Possible Left atrial enlargement  Left ventricular hypertrophy Abnormal ECG since last tracing no  significant change Confirmed by Averil Digman  MD, Vira Agar ([id number]) on 01/28/2014  6:53:45 PM      MDM   Final diagnoses:  Bronchitis   Nonspecific dyspnea with wheezes, and no evidence for pneumonia, PE, ACS, or metabolic instability. Patient is an ex-smoker. He is not producing sputum. Vital signs are normal.   Nursing Notes Reviewed/ Care Coordinated Applicable Imaging Reviewed Interpretation of Laboratory Data incorporated into ED treatment  The patient appears reasonably  screened and/or stabilized for discharge and I doubt any other medical condition or other [facility] requiring further screening, evaluation, or treatment in the ED at this time prior to discharge.  Plan: Home Medications- Prednisone; Home Treatments- rest; return here if the recommended treatment, does not improve the symptoms; Recommended follow up- PCP check up in 1 week    Richarda Blade, MD 01/28/14 2109

## 2014-02-01 ENCOUNTER — Ambulatory Visit (INDEPENDENT_AMBULATORY_CARE_PROVIDER_SITE_OTHER): Payer: Medicare Other | Admitting: Physician Assistant

## 2014-02-01 ENCOUNTER — Encounter: Payer: Self-pay | Admitting: Physician Assistant

## 2014-02-01 VITALS — BP 156/86 | HR 73 | Ht 72.0 in | Wt 199.0 lb

## 2014-02-01 DIAGNOSIS — I739 Peripheral vascular disease, unspecified: Secondary | ICD-10-CM

## 2014-02-01 DIAGNOSIS — I658 Occlusion and stenosis of other precerebral arteries: Secondary | ICD-10-CM

## 2014-02-01 DIAGNOSIS — I6523 Occlusion and stenosis of bilateral carotid arteries: Secondary | ICD-10-CM

## 2014-02-01 DIAGNOSIS — E785 Hyperlipidemia, unspecified: Secondary | ICD-10-CM

## 2014-02-01 DIAGNOSIS — I248 Other forms of acute ischemic heart disease: Secondary | ICD-10-CM

## 2014-02-01 DIAGNOSIS — I10 Essential (primary) hypertension: Secondary | ICD-10-CM

## 2014-02-01 DIAGNOSIS — J449 Chronic obstructive pulmonary disease, unspecified: Secondary | ICD-10-CM

## 2014-02-01 DIAGNOSIS — I471 Supraventricular tachycardia, unspecified: Secondary | ICD-10-CM

## 2014-02-01 DIAGNOSIS — I6529 Occlusion and stenosis of unspecified carotid artery: Secondary | ICD-10-CM

## 2014-02-01 DIAGNOSIS — I2510 Atherosclerotic heart disease of native coronary artery without angina pectoris: Secondary | ICD-10-CM

## 2014-02-01 DIAGNOSIS — J40 Bronchitis, not specified as acute or chronic: Secondary | ICD-10-CM

## 2014-02-01 MED ORDER — TICAGRELOR 90 MG PO TABS: 90.0000 mg | ORAL_TABLET | Freq: Two times a day (BID) | ORAL | Status: DC

## 2014-02-01 MED ORDER — AMLODIPINE BESYLATE 2.5 MG PO TABS
2.5000 mg | ORAL_TABLET | Freq: Every day | ORAL | Status: DC
Start: 2014-02-01 — End: 2014-02-01

## 2014-02-01 MED ORDER — ATORVASTATIN CALCIUM 80 MG PO TABS: 80.0000 mg | ORAL_TABLET | Freq: Every day | ORAL | Status: DC

## 2014-02-01 MED ORDER — AMLODIPINE BESYLATE 2.5 MG PO TABS: 2.5000 mg | ORAL_TABLET | Freq: Every day | ORAL | Status: DC

## 2014-02-01 NOTE — Progress Notes (Signed)
Cardiology Office Note    Date:  02/01/2014   ID:  Curtis [name], DOB 1946/01/09, MRN [id number]  PCP:  [name], MD  Cardiologist:  Dr. Sherren Mocha   Electrophysiologist:  Dr. Cristopher Peru    History of Present Illness: Curtis [name] is a 68 y.o. male with a hx of COPD, asbestosis, depression, RSD of L lower extremity, HTN, cerebral aneurysms.  Admitted 9/14-9/18 with a NSTEMI (peak Tn 3.48) in the setting of SVT (AVNRT). He underwent DCCV in the ED.  He underwent RFCA by Dr. Cristopher Peru for SVT.  LHC was done that demonstrated 2 vessel CAD with an occluded RCA and high-grade lesion in the circumflex. Circumflex was treated with DES x2.  Patient did have leg pain and difficult to palpate pulses.  ABIs were abnormal and he was seen by [facility].  He will be followed as an OP. Hospital stay was c/b by labile BPs.  He was treated with antibiotics for suspected pneumonia.  Also, there was AKI that improved prior to DC. Cozaar was held.  He has also had trouble with urinary retention due to BPH.    Since DC patient went to the ED with chest pain.  CEs were normal.  CXR findings were stable.  He was Dx with bronchitis.  He was placed on Prednisone.  He denies further symptoms since.  Denies significant worsening dyspnea.  He is NYHA 2-2b.  He denies orthopnea, PND, edema.  Denies syncope.     Studies:  - LHC (9/15):  LAD 30-40%, ostial D2 60%, proximal OM 80% and 90% (tandem), RCA occluded >> PCI: DES x 2 to the circumflex  - Echo (9/15):  Mild LVH, EF 55-60%, normal wall motion, grade 1 diastolic dysfunction, mild AI, moderate MR, mild LAE  - Carotid US (5/15):  R ICA 42-87%; LICA 6-81%  - ABIs (1/57):  Right 0.8; left 0.6   Recent Labs/Images:  09/07/2013: HDL Cholesterol by NMR 24*; LDL (calc) UNABLE TO CALCULATE IF TRIGLYCERIDE OVER 400 mg/dL  01/16/2014: ALT 19; AST 23; TSH 2.380  01/28/2014: BUN 13; Creatinine 1.24; Hemoglobin 11.4*; Potassium 4.8; Pro B Natriuretic peptide  (BNP) 336.5*; Sodium 137; WBC 7.0    Dg Chest Port 1 View   01/28/2014    IMPRESSION: Stable bilateral basilar opacities are noted most consistent with scarring and possibly loculated effusion on the left. No significant changes noted compared to prior exam.   Electronically Signed   By: Sabino Dick M.D.   On: 01/28/2014 19:14     Wt Readings from Last 3 Encounters:  02/01/14 199 lb (90.266 kg)  01/20/14 207 lb 10.8 oz (94.2 kg)  01/20/14 207 lb 10.8 oz (94.2 kg)     Past Medical History  Diagnosis Date  . Arthritis   . SOB (shortness of breath)   . Pulmonary asbestosis   . Hernia of unspecified site of abdominal cavity without mention of obstruction or gangrene     hiatal  . GERD (gastroesophageal reflux disease)   . Depression   . Diverticulitis     hospital [facility]  . Diverticulosis   . Reflex sympathetic dystrophy   . 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 Hypertension   . IBS (irritable bowel syndrome)     Current Outpatient Prescriptions  Medication Sig Dispense Refill  . alprazolam (XANAX) 2 MG tablet Take 2 mg by mouth 2 (two) times daily.      [name] aspirin 81 MG chewable tablet Chew 1 tablet (81  mg total) by mouth daily.      [name] docusate sodium (COLACE) 100 MG capsule Take 100 mg by mouth 2 (two) times daily as needed for mild constipation.      . finasteride (PROSCAR) 5 MG tablet Take 5 mg by mouth every morning.       . gabapentin (NEURONTIN) 300 MG capsule Take 300 mg by mouth 3 (three) times daily.       . metoprolol (LOPRESSOR) 50 MG tablet Take 25 mg by mouth 2 (two) times daily.      . nortriptyline (PAMELOR) 50 MG capsule Take 50 mg by mouth at bedtime.      [name] omeprazole (PRILOSEC) 20 MG capsule Take 20 mg by mouth daily.      . predniSONE (DELTASONE) 20 MG tablet Take 1 tablet (20 mg total) by mouth 2 (two) times daily.  10 tablet  0  . simvastatin (ZOCOR) 20 MG tablet Take 1 tablet (20 mg total) by mouth at bedtime.  30 tablet  5  . tamsulosin (FLOMAX) 0.4 MG CAPS capsule Take  1 capsule (0.4 mg total) by mouth daily.  30 capsule  5  . ticagrelor (BRILINTA) 90 MG TABS tablet Take 1 tablet (90 mg total) by mouth 2 (two) times daily.  60 tablet  0   No current facility-administered medications for this visit.     Allergies:   Codeine   Social History:  The patient  reports that he quit smoking about 8 years ago. His smoking use included Cigarettes. He has a 125 pack-year smoking history. He quit smokeless tobacco use about 21 months ago. He reports that he does not drink alcohol or use illicit drugs.   Family History:  The patient's family history includes Colon cancer in his maternal aunt, maternal uncle, and mother. There is no history of Heart attack or Stroke.   ROS:  Please see the history of present illness.   He has chronic diarrhea.   All other systems reviewed and negative.    PHYSICAL EXAM: VS:  BP 156/86  Pulse 73  Ht 6' (1.829 m)  Wt 199 lb (90.266 kg)  BMI 26.98 kg/m2 Well nourished, well developed, in no acute distress HEENT: normal Neck: no JVD Cardiac:  normal S1, S2; RRR; no murmur Lungs:  clear to auscultation bilaterally, no wheezing, rhonchi or rales Abd: soft, nontender, no hepatomegaly Ext: no edemaright wrist without hematoma or mass right groin without hematoma or bruit  Skin: warm and dry Neuro:  CNs 2-12 intact, no focal abnormalities noted  EKG:  NSR, HR 73, normal axis, no ST changes      ASSESSMENT AND PLAN:  1. Coronary artery disease:  Patient is doing well since recent non-STEMI treated with a DES x2 to the circumflex. He had some recurrent atypical chest symptoms that brought him to the emergency room. Cardiac markers remained normal. Symptoms improved on prednisone. Continue aspirin, Brilinta, beta blocker, statin. 2. Unspecified essential hypertension:  Blood pressure elevated today. Given his residual occluded RCA and recent chest discomfort, I will place him on amlodipine 2.5 mg daily. Consider adding nitrates if he  has recurrent chest symptoms. 3. HLD (hyperlipidemia):  Triglycerides were significantly elevated in the hospital. I am adding amlodipine as noted above. Stop simvastatin. Start atorvastatin 80 mg daily. Check lipids and LFTs in 6-8 weeks. 4. Carotid stenosis, bilateral:  Plan FU carotid Dopplers in 09/2014 (unless management done by vascular surgery). 5. PAD (peripheral artery disease):  Follow up with  vascular surgery. 6. PSVT (paroxysmal supraventricular tachycardia):  He is status post RFCA. I will review with Dr. Lovena Le to determine when he should follow up. 7. Bronchitis:  Resolved. Follow up primary care. 8. Chronic obstructive pulmonary disease:  Follow up with pulmonology.  Disposition:    FU with Dr. Loralie Champagne 6 weeks.    Signed, Versie Starks, MHS 02/01/2014 12:32 PM    [facility] [address], [place], [place]  [address] Phone: [phone]; Fax: [phone]

## 2014-02-01 NOTE — Patient Instructions (Signed)
Your physician has recommended you make the following change in your medication:  1. STOP SIMVASTATIN 2. START LIPITOR 80 MG 1 TABLET EVERY NIGHT AT BEDTIME 3. START AMLODIPINE 2.5 MG 1 TABLET DAILY  FASTING LIPID AND LIVER PANEL TO BE DONE IN 6-8 WEEKS  Your physician has requested that you have a carotid duplex THIS IS TO BE DONE IN MAY 2016; UNLESS DR. Trula Slade IS GOING TO FOLLOW UP WITH YOUR CAROTIDS. This test is an ultrasound of the carotid arteries in your neck. It looks at blood flow through these arteries that supply the brain with blood. Allow one hour for this exam. There are no restrictions or special instructions.   Your physician recommends that you schedule a follow-up appointment in: [place]. Aundra Dubin

## 2014-02-06 ENCOUNTER — Telehealth: Payer: Self-pay

## 2014-02-06 NOTE — Telephone Encounter (Signed)
PATIENT CALLED STATES THAT [facility] WOULD NOT RELEASE HIS MEDS BECAUSE THEY NEEDED A PROVIDER NAME AND NOT [facility]. I CALLED [facility] AND GAVE THEM DR COOPERS NAME

## 2014-03-06 ENCOUNTER — Encounter: Payer: Self-pay | Admitting: Internal Medicine

## 2014-03-06 ENCOUNTER — Ambulatory Visit (INDEPENDENT_AMBULATORY_CARE_PROVIDER_SITE_OTHER): Payer: Medicare Other | Admitting: Internal Medicine

## 2014-03-06 VITALS — BP 124/68 | HR 86 | Ht 72.0 in | Wt 199.0 lb

## 2014-03-06 DIAGNOSIS — I214 Non-ST elevation (NSTEMI) myocardial infarction: Secondary | ICD-10-CM

## 2014-03-06 DIAGNOSIS — I248 Other forms of acute ischemic heart disease: Secondary | ICD-10-CM

## 2014-03-06 DIAGNOSIS — I10 Essential (primary) hypertension: Secondary | ICD-10-CM

## 2014-03-06 DIAGNOSIS — I471 Supraventricular tachycardia: Secondary | ICD-10-CM

## 2014-03-06 NOTE — Assessment & Plan Note (Signed)
Despite his host of complaints, he does not have anginal symptoms. He will continue his current medical therapy.

## 2014-03-06 NOTE — Assessment & Plan Note (Signed)
He has had no recurrent exacerbations. No change in medical therapy.

## 2014-03-06 NOTE — Progress Notes (Signed)
HPI [name] returns today for followup. He is a pleasant 68 yo man with CAD and SVT who underwent catheter ablation several weeks ago.  In the interim, he has had a post of complaints, including diarrhea, cold and sinus drainage, leg pain, and difficulty breathing secondary to sinus congestion. He has not had syncope. He denies shortness of breath. He has been taking antibiotics. He is concerned about possible medication interactions.  Allergies  Allergen Reactions  . Codeine Itching     Current Outpatient Prescriptions  Medication Sig Dispense Refill  . alprazolam (XANAX) 2 MG tablet Take 2 mg by mouth 2 (two) times daily.    [name] amLODipine (NORVASC) 2.5 MG tablet Take 1 tablet (2.5 mg total) by mouth daily. 90 tablet 3  . aspirin 81 MG chewable tablet Chew 1 tablet (81 mg total) by mouth daily.    [name] atorvastatin (LIPITOR) 80 MG tablet Take 1 tablet (80 mg total) by mouth daily. 90 tablet 3  . benzonatate (TESSALON) 100 MG capsule Take 1 capsule by mouth 3 (three) times daily.  0  . clarithromycin (BIAXIN) 500 MG tablet Take 1 tablet by mouth 2 (two) times daily.  0  . finasteride (PROSCAR) 5 MG tablet Take 5 mg by mouth every morning.     . gabapentin (NEURONTIN) 300 MG capsule Take 300 mg by mouth 3 (three) times daily.     . metoprolol (LOPRESSOR) 50 MG tablet Take 25 mg by mouth 2 (two) times daily.    . nortriptyline (PAMELOR) 50 MG capsule Take 50 mg by mouth at bedtime.    . predniSONE (DELTASONE) 10 MG tablet Take as directed  0  . tamsulosin (FLOMAX) 0.4 MG CAPS capsule Take 1 capsule (0.4 mg total) by mouth daily. 30 capsule 5  . ticagrelor (BRILINTA) 90 MG TABS tablet Take 1 tablet (90 mg total) by mouth 2 (two) times daily. 180 tablet 3   No current facility-administered medications for this visit.     Past Medical History  Diagnosis Date  . Arthritis   . SOB (shortness of breath)   . Pulmonary asbestosis   . Hernia of unspecified site of abdominal cavity  without mention of obstruction or gangrene     hiatal  . GERD (gastroesophageal reflux disease)   . Depression   . Diverticulitis     hospital 2011 [facility]  . Diverticulosis   . Reflex sympathetic dystrophy   . 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 Hypertension   . IBS (irritable bowel syndrome)     ROS:   All systems reviewed and negative except as noted in the HPI.   Past Surgical History  Procedure Laterality Date  . Nissen fundoplication    . Right elbow surgery      x 2  . Right knee arthroscopy    . Left shoulder       x 3  . Squamous cell skin cancer      Left Hand  . Cataract extraction       Family History  Problem Relation Age of Onset  . Colon cancer Mother   . Colon cancer Maternal Aunt   . Colon cancer Maternal Uncle   . Heart attack Neg Hx   . Stroke Neg Hx      History   Social History  . Marital Status: Married    Spouse Name: N/A    Number of Children: N/A  . Years of Education: N/A   Occupational History  .  Veteran    Social History Main Topics  . Smoking status: Former Smoker -- 2.50 packs/day for 50 years    Types: Cigarettes    Quit date: 05/05/2005  . Smokeless tobacco: Former Systems developer    Quit date: 05/03/2012  . Alcohol Use: No     Comment: last use 2013  . Drug Use: No  . Sexual Activity: Not on file     Comment: widowed, Veteran   Other Topics Concern  . Not on file   Social History Narrative     BP 124/68 mmHg  Pulse 86  Ht 6' (1.829 m)  Wt 199 lb (90.266 kg)  BMI 26.98 kg/m2  Physical Exam:  Well appearing 68 year old man, NAD HEENT: Unremarkable Neck:  No JVD, no thyromegally Back:  No CVA tenderness Lungs:  Clearwith no wheezes, rales, or rhonchi. HEART:  Regular rate rhythm, no murmurs, no rubs, no clicks Abd:  soft, positive bowel sounds, no organomegally, no rebound, no guarding Ext:  2 plus pulses, no edema, no cyanosis, no clubbing Skin:  No rashes no nodules Neuro:  CN II through XII intact, motor grossly  intact   Assess/Plan:

## 2014-03-06 NOTE — Patient Instructions (Signed)
Your physician recommends that you schedule a follow-up appointment in: Feb in 2016 with Dr Wilhemina Cash

## 2014-03-06 NOTE — Assessment & Plan Note (Signed)
The patient is maintaining sinus rhythm very nicely. He has had no recurrent symptomatic SVT.

## 2014-03-06 NOTE — Assessment & Plan Note (Addendum)
His blood pressure is well controlled today. He will continue his current medications. Because the patient's blood pressure is fairly well controlled, and because he has had mild peripheral edema, we will stop Norvasc.

## 2014-03-08 ENCOUNTER — Ambulatory Visit: Payer: Medicare Other | Admitting: Physician Assistant

## 2014-03-27 ENCOUNTER — Other Ambulatory Visit: Payer: Medicare Other

## 2014-04-03 ENCOUNTER — Telehealth: Payer: Self-pay | Admitting: Physician Assistant

## 2014-04-03 ENCOUNTER — Other Ambulatory Visit (INDEPENDENT_AMBULATORY_CARE_PROVIDER_SITE_OTHER): Payer: Medicare Other | Admitting: *Deleted

## 2014-04-03 DIAGNOSIS — E785 Hyperlipidemia, unspecified: Secondary | ICD-10-CM

## 2014-04-03 DIAGNOSIS — I2510 Atherosclerotic heart disease of native coronary artery without angina pectoris: Secondary | ICD-10-CM

## 2014-04-03 LAB — HEPATIC FUNCTION PANEL
ALK PHOS: 92 U/L (ref 39–117)
ALT: 16 U/L (ref 0–53)
AST: 16 U/L (ref 0–37)
Albumin: 3.8 g/dL (ref 3.5–5.2)
Bilirubin, Direct: 0 mg/dL (ref 0.0–0.3)
TOTAL PROTEIN: 7.4 g/dL (ref 6.0–8.3)
Total Bilirubin: 0.4 mg/dL (ref 0.2–1.2)

## 2014-04-03 LAB — LIPID PANEL
CHOLESTEROL: 107 mg/dL (ref 0–200)
HDL: 19.6 mg/dL — ABNORMAL LOW (ref >39.00)
NonHDL: 87.4
Total CHOL/HDL Ratio: 5
Triglycerides: 281 mg/dL — ABNORMAL HIGH (ref 0.0–149.0)
VLDL: 56.2 mg/dL — AB (ref 0.0–40.0)

## 2014-04-03 LAB — LDL CHOLESTEROL, DIRECT: Direct LDL: 43.7 mg/dL

## 2014-04-03 NOTE — Telephone Encounter (Signed)
New msg   Patient calling to see if lab work results are in. Please contact at [phone].

## 2014-04-04 NOTE — Telephone Encounter (Signed)
pt notified about lab results with verbal understanding. Pt advised to increase exercise and watch diet to help improve Trigs and HDL.. Pt agreeable to Plan of Care.

## 2014-04-05 ENCOUNTER — Ambulatory Visit: Payer: Medicare Other | Admitting: Physician Assistant

## 2014-04-13 ENCOUNTER — Encounter (HOSPITAL_COMMUNITY): Payer: Self-pay | Admitting: Internal Medicine

## 2014-04-17 ENCOUNTER — Emergency Department (HOSPITAL_COMMUNITY): Payer: Medicare Other

## 2014-04-17 ENCOUNTER — Observation Stay (HOSPITAL_COMMUNITY)
Admission: EM | Admit: 2014-04-17 | Discharge: 2014-04-20 | Disposition: A | Discharge status: Home / Self Care | Payer: Medicare Other | Attending: Internal Medicine | Admitting: Internal Medicine

## 2014-04-17 ENCOUNTER — Encounter (HOSPITAL_COMMUNITY): Payer: Self-pay | Admitting: Emergency Medicine

## 2014-04-17 DIAGNOSIS — D649 Anemia, unspecified: Secondary | ICD-10-CM | POA: Insufficient documentation

## 2014-04-17 DIAGNOSIS — I2510 Atherosclerotic heart disease of native coronary artery without angina pectoris: Secondary | ICD-10-CM | POA: Diagnosis not present

## 2014-04-17 DIAGNOSIS — Z800 Family history of malignant neoplasm of digestive organs: Secondary | ICD-10-CM | POA: Diagnosis not present

## 2014-04-17 DIAGNOSIS — R079 Chest pain, unspecified: Secondary | ICD-10-CM | POA: Insufficient documentation

## 2014-04-17 DIAGNOSIS — R0602 Shortness of breath: Secondary | ICD-10-CM

## 2014-04-17 DIAGNOSIS — Z7982 Long term (current) use of aspirin: Secondary | ICD-10-CM | POA: Diagnosis not present

## 2014-04-17 DIAGNOSIS — K5730 Diverticulosis of large intestine without perforation or abscess without bleeding: Secondary | ICD-10-CM | POA: Diagnosis not present

## 2014-04-17 DIAGNOSIS — R0609 Other forms of dyspnea: Secondary | ICD-10-CM

## 2014-04-17 DIAGNOSIS — I10 Essential (primary) hypertension: Secondary | ICD-10-CM | POA: Diagnosis not present

## 2014-04-17 DIAGNOSIS — Z85828 Personal history of other malignant neoplasm of skin: Secondary | ICD-10-CM | POA: Diagnosis not present

## 2014-04-17 DIAGNOSIS — Z885 Allergy status to narcotic agent status: Secondary | ICD-10-CM | POA: Insufficient documentation

## 2014-04-17 DIAGNOSIS — K589 Irritable bowel syndrome without diarrhea: Secondary | ICD-10-CM | POA: Insufficient documentation

## 2014-04-17 DIAGNOSIS — R1084 Generalized abdominal pain: Secondary | ICD-10-CM | POA: Insufficient documentation

## 2014-04-17 DIAGNOSIS — R627 Adult failure to thrive: Secondary | ICD-10-CM | POA: Diagnosis not present

## 2014-04-17 DIAGNOSIS — G90523 Complex regional pain syndrome I of lower limb, bilateral: Secondary | ICD-10-CM | POA: Insufficient documentation

## 2014-04-17 DIAGNOSIS — K5900 Constipation, unspecified: Secondary | ICD-10-CM | POA: Insufficient documentation

## 2014-04-17 DIAGNOSIS — J61 Pneumoconiosis due to asbestos and other mineral fibers: Secondary | ICD-10-CM | POA: Insufficient documentation

## 2014-04-17 DIAGNOSIS — R5383 Other fatigue: Secondary | ICD-10-CM | POA: Diagnosis not present

## 2014-04-17 DIAGNOSIS — R109 Unspecified abdominal pain: Secondary | ICD-10-CM | POA: Insufficient documentation

## 2014-04-17 DIAGNOSIS — K219 Gastro-esophageal reflux disease without esophagitis: Secondary | ICD-10-CM | POA: Insufficient documentation

## 2014-04-17 DIAGNOSIS — I471 Supraventricular tachycardia: Secondary | ICD-10-CM | POA: Diagnosis not present

## 2014-04-17 DIAGNOSIS — I714 Abdominal aortic aneurysm, without rupture, unspecified: Secondary | ICD-10-CM | POA: Diagnosis present

## 2014-04-17 DIAGNOSIS — I252 Old myocardial infarction: Secondary | ICD-10-CM | POA: Insufficient documentation

## 2014-04-17 DIAGNOSIS — F329 Major depressive disorder, single episode, unspecified: Secondary | ICD-10-CM | POA: Insufficient documentation

## 2014-04-17 DIAGNOSIS — R0600 Dyspnea, unspecified: Secondary | ICD-10-CM | POA: Diagnosis not present

## 2014-04-17 DIAGNOSIS — R6881 Early satiety: Secondary | ICD-10-CM | POA: Diagnosis not present

## 2014-04-17 DIAGNOSIS — Z955 Presence of coronary angioplasty implant and graft: Secondary | ICD-10-CM | POA: Insufficient documentation

## 2014-04-17 DIAGNOSIS — G9050 Complex regional pain syndrome I, unspecified: Secondary | ICD-10-CM | POA: Diagnosis present

## 2014-04-17 DIAGNOSIS — I739 Peripheral vascular disease, unspecified: Secondary | ICD-10-CM | POA: Diagnosis not present

## 2014-04-17 DIAGNOSIS — G8929 Other chronic pain: Secondary | ICD-10-CM | POA: Diagnosis not present

## 2014-04-17 DIAGNOSIS — K2950 Unspecified chronic gastritis without bleeding: Secondary | ICD-10-CM | POA: Insufficient documentation

## 2014-04-17 DIAGNOSIS — E785 Hyperlipidemia, unspecified: Secondary | ICD-10-CM | POA: Insufficient documentation

## 2014-04-17 DIAGNOSIS — J449 Chronic obstructive pulmonary disease, unspecified: Secondary | ICD-10-CM | POA: Diagnosis not present

## 2014-04-17 HISTORY — DX: Peripheral vascular disease, unspecified: I73.9

## 2014-04-17 HISTORY — DX: Abdominal aortic aneurysm, without rupture: I71.4

## 2014-04-17 HISTORY — DX: Hyperlipidemia, unspecified: E78.5

## 2014-04-17 HISTORY — DX: Acute myocardial infarction, unspecified: I21.9

## 2014-04-17 LAB — URINALYSIS, ROUTINE W REFLEX MICROSCOPIC
BILIRUBIN URINE: NEGATIVE
Glucose, UA: NEGATIVE mg/dL
Hgb urine dipstick: NEGATIVE
KETONES UR: NEGATIVE mg/dL
Leukocytes, UA: NEGATIVE
NITRITE: NEGATIVE
Protein, ur: NEGATIVE mg/dL
Specific Gravity, Urine: 1.02 (ref 1.005–1.030)
UROBILINOGEN UA: 0.2 mg/dL (ref 0.0–1.0)
pH: 6.5 (ref 5.0–8.0)

## 2014-04-17 LAB — BASIC METABOLIC PANEL
Anion gap: 14 (ref 5–15)
BUN: 21 mg/dL (ref 6–23)
CO2: 24 mEq/L (ref 19–32)
Calcium: 10.1 mg/dL (ref 8.4–10.5)
Chloride: 103 mEq/L (ref 96–112)
Creatinine, Ser: 1.13 mg/dL (ref 0.50–1.35)
GFR calc Af Amer: 75 mL/min — ABNORMAL LOW (ref >90)
GFR calc non Af Amer: 64 mL/min — ABNORMAL LOW (ref >90)
Glucose, Bld: 105 mg/dL — ABNORMAL HIGH (ref 70–99)
Potassium: 4.9 mEq/L (ref 3.7–5.3)
Sodium: 141 mEq/L (ref 137–147)

## 2014-04-17 LAB — CBC
HCT: 32.8 % — ABNORMAL LOW (ref 39.0–52.0)
Hemoglobin: 10.8 g/dL — ABNORMAL LOW (ref 13.0–17.0)
MCH: 29.2 pg (ref 26.0–34.0)
MCHC: 32.9 g/dL (ref 30.0–36.0)
MCV: 88.6 fL (ref 78.0–100.0)
PLATELETS: 255 10*3/uL (ref 150–400)
RBC: 3.7 MIL/uL — AB (ref 4.22–5.81)
RDW: 15.2 % (ref 11.5–15.5)
WBC: 7.1 10*3/uL (ref 4.0–10.5)

## 2014-04-17 LAB — TROPONIN I
Troponin I: <0.3 ng/mL (ref <0.30)
Troponin I: <0.3 ng/mL (ref <0.30)

## 2014-04-17 LAB — PRO B NATRIURETIC PEPTIDE: Pro B Natriuretic peptide (BNP): 215.3 pg/mL — ABNORMAL HIGH (ref 0–125)

## 2014-04-17 MED ORDER — METOPROLOL TARTRATE 25 MG PO TABS
25.0000 mg | ORAL_TABLET | Freq: Two times a day (BID) | ORAL | Status: DC
  Administered 2014-04-18 – 2014-04-20 (×6): 25 mg via ORAL
  Filled 2014-04-17 (×8): qty 1

## 2014-04-17 MED ORDER — AMITRIPTYLINE HCL 75 MG PO TABS
75.0000 mg | ORAL_TABLET | Freq: Every day | ORAL | Status: DC
  Administered 2014-04-18 – 2014-04-19 (×3): 75 mg via ORAL
  Filled 2014-04-17 (×5): qty 1

## 2014-04-17 MED ORDER — NITROGLYCERIN 0.4 MG SL SUBL: 0.4000 mg | SUBLINGUAL_TABLET | SUBLINGUAL | Status: DC | PRN

## 2014-04-17 MED ORDER — ALPRAZOLAM 0.5 MG PO TABS
2.0000 mg | ORAL_TABLET | Freq: Two times a day (BID) | ORAL | Status: DC
  Administered 2014-04-18 – 2014-04-20 (×5): 2 mg via ORAL
  Filled 2014-04-17 (×5): qty 4

## 2014-04-17 MED ORDER — ASPIRIN 325 MG PO TABS
325.0000 mg | ORAL_TABLET | ORAL | Status: AC
  Administered 2014-04-17: 325 mg via ORAL
  Filled 2014-04-17: qty 1

## 2014-04-17 MED ORDER — TAMSULOSIN HCL 0.4 MG PO CAPS
0.4000 mg | ORAL_CAPSULE | Freq: Every day | ORAL | Status: DC
  Filled 2014-04-17: qty 1

## 2014-04-17 MED ORDER — CLOPIDOGREL BISULFATE 75 MG PO TABS
75.0000 mg | ORAL_TABLET | Freq: Every day | ORAL | Status: DC
  Administered 2014-04-17 – 2014-04-20 (×4): 75 mg via ORAL
  Filled 2014-04-17 (×3): qty 1

## 2014-04-17 MED ORDER — ONDANSETRON HCL 4 MG/2ML IJ SOLN: 4.0000 mg | Freq: Four times a day (QID) | INTRAMUSCULAR | Status: DC | PRN

## 2014-04-17 MED ORDER — ASPIRIN 300 MG RE SUPP: 300.0000 mg | RECTAL | Status: DC

## 2014-04-17 MED ORDER — ENOXAPARIN SODIUM 40 MG/0.4ML ~~LOC~~ SOLN
40.0000 mg | SUBCUTANEOUS | Status: DC
  Administered 2014-04-18 – 2014-04-20 (×3): 40 mg via SUBCUTANEOUS
  Filled 2014-04-17 (×3): qty 0.4

## 2014-04-17 MED ORDER — LUBIPROSTONE 24 MCG PO CAPS
24.0000 ug | ORAL_CAPSULE | Freq: Two times a day (BID) | ORAL | Status: DC
  Administered 2014-04-18 – 2014-04-20 (×6): 24 ug via ORAL
  Filled 2014-04-17 (×7): qty 1

## 2014-04-17 MED ORDER — ATORVASTATIN CALCIUM 80 MG PO TABS
80.0000 mg | ORAL_TABLET | Freq: Every day | ORAL | Status: DC
  Administered 2014-04-18: 80 mg via ORAL
  Filled 2014-04-17: qty 1

## 2014-04-17 MED ORDER — AMLODIPINE BESYLATE 2.5 MG PO TABS
2.5000 mg | ORAL_TABLET | Freq: Every day | ORAL | Status: DC
  Administered 2014-04-17 – 2014-04-20 (×4): 2.5 mg via ORAL
  Filled 2014-04-17 (×4): qty 1

## 2014-04-17 MED ORDER — ALPRAZOLAM 0.25 MG PO TABS
0.5000 mg | ORAL_TABLET | Freq: Once | ORAL | Status: AC
  Administered 2014-04-17: 0.5 mg via ORAL
  Filled 2014-04-17: qty 2

## 2014-04-17 MED ORDER — FINASTERIDE 5 MG PO TABS
5.0000 mg | ORAL_TABLET | Freq: Every day | ORAL | Status: DC
  Administered 2014-04-18 – 2014-04-20 (×3): 5 mg via ORAL
  Filled 2014-04-17 (×3): qty 1

## 2014-04-17 MED ORDER — ASPIRIN 81 MG PO CHEW
81.0000 mg | CHEWABLE_TABLET | Freq: Every day | ORAL | Status: DC
  Administered 2014-04-18 – 2014-04-20 (×3): 81 mg via ORAL
  Filled 2014-04-17 (×3): qty 1

## 2014-04-17 MED ORDER — ASPIRIN 81 MG PO CHEW: 324.0000 mg | CHEWABLE_TABLET | ORAL | Status: DC

## 2014-04-17 MED ORDER — ACETAMINOPHEN 325 MG PO TABS
650.0000 mg | ORAL_TABLET | ORAL | Status: DC | PRN
  Administered 2014-04-18: 650 mg via ORAL
  Filled 2014-04-17: qty 2

## 2014-04-17 MED ORDER — GABAPENTIN 300 MG PO CAPS
300.0000 mg | ORAL_CAPSULE | Freq: Three times a day (TID) | ORAL | Status: DC
  Administered 2014-04-18 – 2014-04-20 (×9): 300 mg via ORAL
  Filled 2014-04-17 (×10): qty 1

## 2014-04-17 NOTE — ED Notes (Signed)
Pt arrives via wheelchair from home. Family reports pt with stent placement Sept 14th. Since then pt has been gradually declining in health per family. Pt unable to care for himself, with ongoing chest pains and SOB per family. Took 1 81mg  ASA today.

## 2014-04-17 NOTE — ED Notes (Signed)
MD at bedside. 

## 2014-04-17 NOTE — H&P (Signed)
History and Physical   Admit date: 04/17/2014 Name:  Curtis [name] Medical record number: [id number] DOB/Age:  69/11/1945  68 y.o. male  Referring Physician:   [facility]  Primary Physician:   Kathryne Eriksson  Chief complaint/reason for admission: Chest pain and shortness of breath  HPI:  This 68 year old male is a very rambling historian but presents to the emergency room with progressive shortness of breath and chest discomfort.  He has a complex history of reflex sympathetic dystrophy, COPD and asbestosis first which she is on disability.  He presented to the emergency room in September with SVT requiring cardioversion.  He had elevation of troponin and prior to this 24 hours of chest pain.  He subsequently had an AV nodal reentry tachycardia ablated by Dr. Lovena Le in the next he underwent cardiac catheterization that showed an occluded right coronary artery and severe proximal circumflex stenosis.  He had placement of a Promus 3.5 x 38 mm stent postdilated to 3.75 mm.  He evidently had a 70% ostial stenosis of the marginal branch.  The was unable to dilate and this was just treated medically.  According to the family, he did quite well going home and on his first follow-up visit was placed on amlodipine as well as beta blockers.  He was taken off simvastatin and placed on high-dose atorvastatin..  Since then, he states he has gone downhill.  He had an emergency room visit for chest discomfort and also had some shortness of breath that was attributed to bronchitis and treated with prednisone.  He has had progressive shortness of breath since then and presented to the emergency room today with fatigue, malaise, progressive shortness of breath and inability to do activities.  It is very difficult to tell whether the chest pain is anginal in nature or not.  Initial troponin was negative.  The major complaint is dyspnea and in light of his progressive symptoms.  He is brought into the  hospital for evaluation.    Past Medical History  Diagnosis Date  . Arthritis   . SOB (shortness of breath)   . Pulmonary asbestosis   . Hernia of unspecified site of abdominal cavity without mention of obstruction or gangrene     hiatal  . GERD (gastroesophageal reflux disease)   . Depression   . Diverticulitis     hospital 2011 [facility]  . Diverticulosis   . Reflex sympathetic dystrophy   . 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 Hypertension   . IBS (irritable bowel syndrome)   . Peripheral vascular disease     ankle brachial index of 0.79 on the right and 0.61 on the left.      Past Surgical History  Procedure Laterality Date  . Nissen fundoplication    . Right elbow surgery      x 2  . Right knee arthroscopy    . Left shoulder       x 3  . Squamous cell skin cancer      Left Hand  . Cataract extraction    . Supraventricular tachycardia ablation N/A 01/16/2014    Procedure: SUPRAVENTRICULAR TACHYCARDIA ABLATION;  Surgeon: Evans Lance, MD;  Location: [facility] CATH LAB;  Service: Cardiovascular;  Laterality: N/A;  . Left heart catheterization with coronary angiogram N/A 01/17/2014    Procedure: LEFT HEART CATHETERIZATION WITH CORONARY ANGIOGRAM;  Surgeon: Leonie Man, MD;  Location: [facility] CATH LAB;  Service: Cardiovascular;  Laterality: N/A;   Allergies: is allergic to codeine.   Medications:  Prior to Admission medications   Medication Sig Start Date End Date Taking? Authorizing Provider  alprazolam [name]) 2 MG tablet Take 2 mg by mouth 2 (two) times daily.   Yes Historical Provider, MD  amitriptyline (ELAVIL) 75 MG tablet Take 75 mg by mouth at bedtime.   Yes Historical Provider, MD  amLODipine (NORVASC) 2.5 MG tablet Take 1 tablet (2.5 mg total) by mouth daily. 02/01/14  Yes [name], PA-C  aspirin 81 MG chewable tablet Chew 1 tablet (81 mg total) by mouth daily. 01/20/14  Yes [name], NP  atorvastatin (LIPITOR) 80 MG tablet Take 1 tablet (80 mg total) by mouth daily. 02/01/14  Yes  [name], PA-C  finasteride (PROSCAR) 5 MG tablet Take 5 mg by mouth every morning.    Yes Historical Provider, MD  gabapentin (NEURONTIN) 300 MG capsule Take 300 mg by mouth 3 (three) times daily.    Yes Historical Provider, MD  lubiprostone (AMITIZA) 24 MCG capsule Take 24 mcg by mouth 2 (two) times daily with a meal.   Yes Historical Provider, MD  metoprolol (LOPRESSOR) 50 MG tablet Take 25 mg by mouth 2 (two) times daily.   Yes Historical Provider, MD  tamsulosin (FLOMAX) 0.4 MG CAPS capsule Take 1 capsule (0.4 mg total) by mouth daily. 09/08/13  Yes [name], MD  ticagrelor (BRILINTA) 90 MG TABS tablet Take 1 tablet (90 mg total) by mouth 2 (two) times daily. 02/01/14  Yes [name], PA-C    Family History:  Family Status  Relation Status Death Age  . Mother Deceased   . Father Deceased     Social History:   reports that he quit smoking about 8 years ago. His smoking use included Cigarettes. He has a 125 pack-year smoking history. He quit smokeless tobacco use about 1 years ago. He reports that he does not drink alcohol or use illicit drugs.   History   Social History Narrative     Review of Systems: It is diffusely positive.  He has reflex sympathetic dystrophy and chronic pain in both of his legs.  He was seen recently in the hospital by the vascular surgeons found to have reduced arterial brachial indices.  He complains of severe prostatism and has urgency, hesitancy, and frequency and enormous difficulties voiding.  He complains of diverticulitis and has some chronic constipation as well as some abdominal pain. Other than as noted above, the remainder of the review of systems is normal  Physical Exam: BP 141/84 mmHg  Pulse 75  Temp(Src) 98.4 F (36.9 C) (Oral)  Resp 16  SpO2 100% General appearance: He is a talkative, mildly obese male who is a rambling historian Head: Normocephalic, without obvious abnormality, atraumatic Eyes: conjunctivae/corneas clear.  PERRL, EOM's intact. Fundi benign. Neck: no adenopathy, no carotid bruit, no JVD and supple, symmetrical, trachea midline Lungs: clear to auscultation bilaterally and Increased AP diameter Heart: regular rate and rhythm, S1, S2 normal, no murmur, click, rub or gallop Abdomen: soft, non-tender; bowel sounds normal; no masses,  no organomegaly Rectal: deferred Extremities: 1+ edema noted Pulses: 2+ and symmetric Peripheral pulses are somewhat diminished but palpable at 1+ Skin: Skin color, texture, turgor normal. No rashes or lesions Neurologic: Grossly normal  Labs: CBC  Recent Labs  04/17/14 1422  WBC 7.1  RBC 3.70*  HGB 10.8*  HCT 32.8*  PLT 255  MCV 88.6  MCH 29.2  MCHC 32.9  RDW 15.2   CMP   Recent  Labs  04/17/14 1422  NA 141  K 4.9  CL 103  CO2 24  GLUCOSE 105*  BUN 21  CREATININE 1.13  CALCIUM 10.1  GFRNONAA 64*  GFRAA 75*   BNP (last 3 results)  Recent Labs  01/16/14 0237 01/28/14 1900 04/17/14 1440  PROBNP 2047.0* 336.5* 215.3*   Cardiac Panel (last 3 results)  Recent Labs  04/17/14 1730  TROPONINI <0.30   Thyroid  Lab Results  Component Value Date   TSH 2.380 01/16/2014    EKG: Possible QS pattern seen across anterior precordial leads with small Q waves but otherwise normal EKG  Radiology: Changes of CAD with asbestosis   IMPRESSIONS: 1.  Progressive dyspnea on exertion that may be due to [facility], but in light of chest discomfort could also be an anginal or ischemic situation. 2.  Coronary artery disease with previous occluded right coronary artery and recent drug-eluting stent to the circumflex coronary artery with minimal LAD disease. 3.  Hypertension. 4.  Asbestosis. 5.  Reflex sympathetic dystrophy. 6.  Peripheral vascular disease with mild claudication. 7.  Hyperlipidemia. 8.  Malaise and fatigue with failure to thrive 9.  Anemia of uncertain cause  PLAN: History is very difficult to sort out.  We'll obtain serial  cardiac enzymes.  I am going to change him to Plavix to see if this has any relation to the dyspnea.  Question is whether he has enough symptoms to warrantrecatheterization or not.  He does have some mild anemia.  Further workup by primary team.  Signed: Kerry Hough MD [facility]  04/17/2014, 8:24 PM

## 2014-04-17 NOTE — ED Notes (Signed)
Bladder scan showed 12ml max.

## 2014-04-17 NOTE — ED Provider Notes (Signed)
CSN: [id number]     Arrival date & time 04/17/14  1330 History   First MD Initiated Contact with Patient 04/17/14 1427     Chief Complaint  Patient presents with  . Chest Pain  . Shortness of Breath     (Consider location/radiation/quality/duration/timing/severity/associated sxs/prior Treatment) Patient is a 68 y.o. male presenting with chest pain and shortness of breath. The history is provided by the patient.  Chest Pain Pain location:  Substernal area Pain quality: tightness   Pain radiates to:  Does not radiate Pain radiates to the back: no   Pain severity:  Mild Onset quality:  Gradual Timing:  Intermittent Progression:  Worsening Chronicity:  Recurrent Context: at rest   Relieved by:  Nothing Worsened by:  Nothing tried Ineffective treatments:  None tried Associated symptoms: abdominal pain (for years, non-specific location), cough and shortness of breath   Associated symptoms: no fever, no headache, no nausea, no numbness and not vomiting   Shortness of Breath Associated symptoms: abdominal pain (for years, non-specific location), chest pain and cough   Associated symptoms: no fever, no headaches, no neck pain and no vomiting     Past Medical History  Diagnosis Date  . Arthritis   . SOB (shortness of breath)   . Pulmonary asbestosis   . Hernia of unspecified site of abdominal cavity without mention of obstruction or gangrene     hiatal  . GERD (gastroesophageal reflux disease)   . Depression   . Diverticulitis     hospital 2011 [facility]  . Diverticulosis   . Reflex sympathetic dystrophy   . 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 Hypertension   . IBS (irritable bowel syndrome)    Past Surgical History  Procedure Laterality Date  . Nissen fundoplication    . Right elbow surgery      x 2  . Right knee arthroscopy    . Left shoulder       x 3  . Squamous cell skin cancer      Left Hand  . Cataract extraction    . Supraventricular tachycardia ablation N/A 01/16/2014     Procedure: SUPRAVENTRICULAR TACHYCARDIA ABLATION;  Surgeon: Evans Lance, MD;  Location: [facility] CATH LAB;  Service: Cardiovascular;  Laterality: N/A;  . Left heart catheterization with coronary angiogram N/A 01/17/2014    Procedure: LEFT HEART CATHETERIZATION WITH CORONARY ANGIOGRAM;  Surgeon: Leonie Man, MD;  Location: [facility] CATH LAB;  Service: Cardiovascular;  Laterality: N/A;   Family History  Problem Relation Age of Onset  . Colon cancer Mother   . Colon cancer Maternal Aunt   . Colon cancer Maternal Uncle   . Heart attack Neg Hx   . Stroke Neg Hx    History  Substance Use Topics  . Smoking status: Former Smoker -- 2.50 packs/day for 50 years    Types: Cigarettes    Quit date: 05/05/2005  . Smokeless tobacco: Former Systems developer    Quit date: 05/03/2012  . Alcohol Use: No     Comment: last use 2013    Review of Systems  Constitutional: Negative for fever.  HENT: Negative for drooling and rhinorrhea.   Eyes: Negative for pain.  Respiratory: Positive for cough and shortness of breath.   Cardiovascular: Positive for chest pain. Negative for leg swelling.  Gastrointestinal: Positive for abdominal pain (for years, non-specific location). Negative for nausea, vomiting and diarrhea.  Genitourinary: Negative for dysuria and hematuria.  Musculoskeletal: Negative for gait problem and neck pain.  Skin: Negative for color  change.  Neurological: Negative for numbness and headaches.  Hematological: Negative for adenopathy.  Psychiatric/Behavioral: Negative for behavioral problems.  All other systems reviewed and are negative.     Allergies  Codeine  Home Medications   Prior to Admission medications   Medication Sig Start Date End Date Taking? Authorizing Provider  alprazolam [name]) 2 MG tablet Take 2 mg by mouth 2 (two) times daily.    Historical Provider, MD  amLODipine (NORVASC) 2.5 MG tablet Take 1 tablet (2.5 mg total) by mouth daily. 02/01/14   [name], PA-C  aspirin 81 MG  chewable tablet Chew 1 tablet (81 mg total) by mouth daily. 01/20/14   [name], NP  atorvastatin (LIPITOR) 80 MG tablet Take 1 tablet (80 mg total) by mouth daily. 02/01/14   [name], PA-C  benzonatate (TESSALON) 100 MG capsule Take 1 capsule by mouth 3 (three) times daily. 03/03/14   Historical Provider, MD  clarithromycin (BIAXIN) 500 MG tablet Take 1 tablet by mouth 2 (two) times daily. 03/03/14   Historical Provider, MD  finasteride (PROSCAR) 5 MG tablet Take 5 mg by mouth every morning.     Historical Provider, MD  gabapentin (NEURONTIN) 300 MG capsule Take 300 mg by mouth 3 (three) times daily.     Historical Provider, MD  metoprolol (LOPRESSOR) 50 MG tablet Take 25 mg by mouth 2 (two) times daily.    Historical Provider, MD  nortriptyline (PAMELOR) 50 MG capsule Take 50 mg by mouth at bedtime.    Historical Provider, MD  predniSONE (DELTASONE) 10 MG tablet Take as directed 03/03/14   Historical Provider, MD  tamsulosin (FLOMAX) 0.4 MG CAPS capsule Take 1 capsule (0.4 mg total) by mouth daily. 09/08/13   [name], MD  ticagrelor (BRILINTA) 90 MG TABS tablet Take 1 tablet (90 mg total) by mouth 2 (two) times daily. 02/01/14   [name], PA-C   BP 129/62 mmHg  Pulse 83  Temp(Src) 98.4 F (36.9 C) (Oral)  Resp 18  SpO2 96% Physical Exam  Constitutional: He is oriented to person, place, and time. He appears well-developed and well-nourished.  HENT:  Head: Normocephalic and atraumatic.  Right Ear: External ear normal.  Left Ear: External ear normal.  Nose: Nose normal.  Mouth/Throat: Oropharynx is clear and moist. No oropharyngeal exudate.  Eyes: Conjunctivae and EOM are normal. Pupils are equal, round, and reactive to light.  Neck: Normal range of motion. Neck supple.  Cardiovascular: Normal rate, regular rhythm, normal heart sounds and intact distal pulses.  Exam reveals no gallop and no friction rub.   No murmur heard. Pulmonary/Chest: Effort normal and breath  sounds normal. No respiratory distress. He has no wheezes.  Diminished breath sounds bilaterally.  Abdominal: Soft. Bowel sounds are normal. He exhibits no distension. There is no tenderness. There is no rebound and no guarding.  Musculoskeletal: Normal range of motion. He exhibits no edema or tenderness.  Neurological: He is alert and oriented to person, place, and time.  Skin: Skin is warm and dry.  Psychiatric: He has a normal mood and affect. His behavior is normal.  Nursing note and vitals reviewed.   ED Course  Procedures (including critical care time) Labs Review Labs Reviewed  CBC - Abnormal; Notable for the following:    RBC 3.70 (*)    Hemoglobin 10.8 (*)    HCT 32.8 (*)    All other components within normal limits  BASIC METABOLIC PANEL - Abnormal; Notable for  the following:    Glucose, Bld 105 (*)    GFR calc non Af Amer 64 (*)    GFR calc Af Amer 75 (*)    All other components within normal limits  PRO B NATRIURETIC PEPTIDE - Abnormal; Notable for the following:    Pro B Natriuretic peptide (BNP) 215.3 (*)    All other components within normal limits  URINALYSIS, ROUTINE W REFLEX MICROSCOPIC  TROPONIN I  TROPONIN I  TROPONIN I  TROPONIN I  LIPID PANEL    Imaging Review Dg Chest 2 View  04/17/2014   CLINICAL DATA:  Chest pain.  Shortness of breath.  EXAM: CHEST  2 VIEW  COMPARISON:  01/28/2014  FINDINGS: Scattered coarse peripheral interstitial accentuation in the lung apices and lung bases. There is some mild associated nodularity bilaterally. Bandlike airspace opacities in the lung bases, similar to prior. Scattered calcified pleural plaques.  Mild to moderate enlargement of the cardiopericardial silhouette atherosclerotic aortic arch. No significant pleural effusion.  IMPRESSION: 1. Essentially stable appearance of scattered calcified pleural plaques, interstitial accentuation raising the possibility of fibrosis, and peripheral airspace opacities in the lung  bases. 2. Mild to moderate enlargement of the cardiopericardial silhouette.   Electronically Signed   By: Sherryl Barters M.D.   On: 04/17/2014 15:36     EKG Interpretation   Date/Time:  Monday April 17 2014 13:36:40 EST Ventricular Rate:  83 PR Interval:  200 QRS Duration: 92 QT Interval:  368 QTC Calculation: 432 R Axis:   38 Text Interpretation:  Normal sinus rhythm Normal ECG since last tracing no  significant change Confirmed by Sabra Heck  MD, BRIAN ([id number]) on 04/17/2014  2:27:32 PM      MDM   Final diagnoses:  Chest pain    3:07 PM 68 y.o. male w hx of SVT s/p ablation in 9/15, CAD s/p PCI w/ DES in 9/15, HTN who presents with gradually worsening shortness of breath and intermittent chest tightness over the last few weeks. He states that after his admission in September he did well for several weeks in October. The son notes a gradual decline in his function starting in November and continuing until now. The patient notes worsening generalized weakness, shortness of breath, and intermittent chest tightness lasting for minutes to hours daily. He currently reports chest tightness 6 out of 10. He is afebrile and vital signs are unremarkable here. We'll get screening labs and imaging. He was given a full aspirin.  Consulted cards who will admit.     Pamella Pert, MD 04/17/14 2350

## 2014-04-17 NOTE — ED Notes (Signed)
Cardiology at bedside.

## 2014-04-17 NOTE — ED Notes (Signed)
Patient ambulated with minimal assistance from EMT. Pt oxygen levels were 96% on room air and pulse was initially 77. After walking oxygen levels decreased to 93% and pulse increased to 86. Pt complains of weakness without any shortness of breath

## 2014-04-18 ENCOUNTER — Observation Stay (HOSPITAL_COMMUNITY): Payer: Medicare Other

## 2014-04-18 ENCOUNTER — Encounter (HOSPITAL_COMMUNITY): Payer: Self-pay | Admitting: General Practice

## 2014-04-18 DIAGNOSIS — R0609 Other forms of dyspnea: Secondary | ICD-10-CM

## 2014-04-18 DIAGNOSIS — R0600 Dyspnea, unspecified: Secondary | ICD-10-CM | POA: Diagnosis not present

## 2014-04-18 DIAGNOSIS — R0602 Shortness of breath: Secondary | ICD-10-CM

## 2014-04-18 DIAGNOSIS — R079 Chest pain, unspecified: Secondary | ICD-10-CM | POA: Diagnosis not present

## 2014-04-18 DIAGNOSIS — R5383 Other fatigue: Secondary | ICD-10-CM | POA: Diagnosis not present

## 2014-04-18 DIAGNOSIS — R627 Adult failure to thrive: Secondary | ICD-10-CM | POA: Diagnosis not present

## 2014-04-18 LAB — BASIC METABOLIC PANEL
ANION GAP: 16 — AB (ref 5–15)
BUN: 18 mg/dL (ref 6–23)
CO2: 24 mEq/L (ref 19–32)
Calcium: 9.8 mg/dL (ref 8.4–10.5)
Chloride: 100 mEq/L (ref 96–112)
Creatinine, Ser: 1.21 mg/dL (ref 0.50–1.35)
GFR calc Af Amer: 69 mL/min — ABNORMAL LOW (ref >90)
GFR, EST NON AFRICAN AMERICAN: 59 mL/min — AB (ref >90)
GLUCOSE: 123 mg/dL — AB (ref 70–99)
Potassium: 4.2 mEq/L (ref 3.7–5.3)
SODIUM: 140 meq/L (ref 137–147)

## 2014-04-18 LAB — TROPONIN I

## 2014-04-18 LAB — HEPATIC FUNCTION PANEL
ALBUMIN: 3.9 g/dL (ref 3.5–5.2)
ALK PHOS: 105 U/L (ref 39–117)
ALT: 17 U/L (ref 0–53)
AST: 19 U/L (ref 0–37)
Bilirubin, Direct: <0.2 mg/dL (ref 0.0–0.3)
TOTAL PROTEIN: 8 g/dL (ref 6.0–8.3)
Total Bilirubin: 0.5 mg/dL (ref 0.3–1.2)

## 2014-04-18 LAB — LIPID PANEL
CHOL/HDL RATIO: 4.3 ratio
Cholesterol: 102 mg/dL (ref 0–200)
HDL: 24 mg/dL — ABNORMAL LOW (ref >39)
LDL CALC: 34 mg/dL (ref 0–99)
Triglycerides: 222 mg/dL — ABNORMAL HIGH (ref <150)
VLDL: 44 mg/dL — ABNORMAL HIGH (ref 0–40)

## 2014-04-18 MED ORDER — REGADENOSON 0.4 MG/5ML IV SOLN
INTRAVENOUS | Status: AC
  Administered 2014-04-18: 08:00:00
  Filled 2014-04-18: qty 5

## 2014-04-18 MED ORDER — TECHNETIUM TC 99M SESTAMIBI GENERIC - CARDIOLITE
10.0000 | Freq: Once | INTRAVENOUS | Status: AC | PRN
  Administered 2014-04-18: 10 via INTRAVENOUS

## 2014-04-18 MED ORDER — ALUM & MAG HYDROXIDE-SIMETH 200-200-20 MG/5ML PO SUSP
30.0000 mL | Freq: Four times a day (QID) | ORAL | Status: DC | PRN
  Administered 2014-04-18 – 2014-04-19 (×2): 30 mL via ORAL
  Filled 2014-04-18 (×2): qty 30

## 2014-04-18 MED ORDER — LOPERAMIDE HCL 2 MG PO CAPS: 4.0000 mg | ORAL_CAPSULE | ORAL | Status: DC | PRN

## 2014-04-18 MED ORDER — ATORVASTATIN CALCIUM 40 MG PO TABS
40.0000 mg | ORAL_TABLET | Freq: Every day | ORAL | Status: DC
  Administered 2014-04-19: 40 mg via ORAL
  Filled 2014-04-18 (×2): qty 1

## 2014-04-18 MED ORDER — TAMSULOSIN HCL 0.4 MG PO CAPS
0.4000 mg | ORAL_CAPSULE | Freq: Every day | ORAL | Status: DC
  Administered 2014-04-18 – 2014-04-19 (×2): 0.4 mg via ORAL
  Filled 2014-04-18 (×3): qty 1

## 2014-04-18 MED ORDER — TECHNETIUM TC 99M SESTAMIBI GENERIC - CARDIOLITE
30.0000 | Freq: Once | INTRAVENOUS | Status: AC | PRN
  Administered 2014-04-18: 30 via INTRAVENOUS

## 2014-04-18 MED ORDER — REGADENOSON 0.4 MG/5ML IV SOLN
0.4000 mg | Freq: Once | INTRAVENOUS | Status: AC
  Administered 2014-04-18: 0.4 mg via INTRAVENOUS
  Filled 2014-04-18: qty 5

## 2014-04-18 NOTE — Progress Notes (Signed)
UR completed 

## 2014-04-18 NOTE — Progress Notes (Signed)
Patient Name: Curtis [name] Date of Encounter: 04/18/2014  Primary cardiologist: Dr. Lovena Le   Principal Problem:   DOE (dyspnea on exertion) Active Problems:   Reflex sympathetic dystrophy   SVT (supraventricular tachycardia) s/p ablation 01/16/2014   CAD (coronary artery disease), native coronary artery    SUBJECTIVE  Very poor historian. Started drifting about expenses with Brilinta costing >$700. Then began to talk about pain under his foot with walking. Complaining of worsening SOB with exertion recently, states he was feeling ok when he was seen by Dr. Lovena Le in the office.    CURRENT MEDS . alprazolam  2 mg Oral BID  . amitriptyline  75 mg Oral QHS  . amLODipine  2.5 mg Oral Daily  . aspirin  81 mg Oral Daily  . [START ON 04/19/2014] atorvastatin  40 mg Oral Daily  . clopidogrel  75 mg Oral Daily  . enoxaparin (LOVENOX) injection  40 mg Subcutaneous Q24H  . finasteride  5 mg Oral Daily  . gabapentin  300 mg Oral TID  . lubiprostone  24 mcg Oral BID WC  . metoprolol  25 mg Oral BID  . tamsulosin  0.4 mg Oral QHS    OBJECTIVE  Filed Vitals:   04/17/14 2128 04/17/14 2150 04/18/14 0340 04/18/14 0856  BP: 198/68 176/61 133/66 140/61  Pulse:  80 69 78  Temp:  98 F (36.7 C) 97.8 F (36.6 C) 97.4 F (36.3 C)  TempSrc:  Oral Oral Oral  Resp:  20 20 20   Height:  6' (1.829 m)    Weight:  191 lb 12.8 oz (87 kg)    SpO2:  99% 97% 95%    Intake/Output Summary (Last 24 hours) at 04/18/14 1208 Last data filed at 04/18/14 0843  Gross per 24 hour  Intake    315 ml  Output    850 ml  Net   -535 ml   Filed Weights   04/17/14 2150  Weight: 191 lb 12.8 oz (87 kg)    PHYSICAL EXAM  General: Pleasant, NAD. Neuro: Alert and oriented X 3. Moves all extremities spontaneously. Psych: Normal affect. HEENT:  Normal  Neck: Supple without bruits or JVD. Lungs:  Resp regular and unlabored, largely clear, decreased breath sound in R base Heart: RRR no s3, s4, or  murmurs. Abdomen: Soft, non-tender, non-distended, BS + x 4.  Extremities: No clubbing, cyanosis or edema. DP/PT/Radials 2+ and equal bilaterally.  Accessory Clinical Findings  CBC  Recent Labs  04/17/14 1422  WBC 7.1  HGB 10.8*  HCT 32.8*  MCV 88.6  PLT 488   Basic Metabolic Panel  Recent Labs  04/17/14 1422  NA 141  K 4.9  CL 103  CO2 24  GLUCOSE 105*  BUN 21  CREATININE 1.13  CALCIUM 10.1   Cardiac Enzymes  Recent Labs  04/17/14 2124 04/18/14 0306 04/18/14 0857  TROPONINI <0.30 <0.30 <0.30   Fasting Lipid Panel  Recent Labs  04/18/14 0306  CHOL 102  HDL 24*  LDLCALC 34  TRIG 222*  CHOLHDL 4.3    TELE NSR with HR 70s    ECG  NSR with 1st degree heart block, no significant ST-T wave changes  Echocardiogram 01/16/2014  LV EF: 55% -  60%  ------------------------------------------------------------------- Indications:   CHF - 428.0.  ------------------------------------------------------------------- History:  PMH: SVT. Dyspnea. Transient ischemic attack. Chronic obstructive pulmonary disease. Risk factors: Former tobacco use. Dyslipidemia.  ------------------------------------------------------------------- Study Conclusions  - Left ventricle: The cavity size was normal. Wall  thickness was increased in a pattern of mild LVH. Systolic function was normal. The estimated ejection fraction was in the range of 55% to 60%. Wall motion was normal; there were no regional wall motion abnormalities. Doppler parameters are consistent with abnormal left ventricular relaxation (grade 1 diastolic dysfunction). The E/e&' ratio is between 8-15, suggesting indeterminate LV filling pressure. - Aortic valve: Structurally normal valve. Trileaflet. There was mild regurgitation. - Mitral valve: Mildly thickened leaflets . There was moderate regurgitation. - Left atrium: Mildly dilated at 35 ml/m2.  Impressions:  -  Compared to the echo in 09/2013, there are few changes - there is now moderate MR.     Radiology/Studies  Dg Chest 2 View  04/17/2014   CLINICAL DATA:  Chest pain.  Shortness of breath.  EXAM: CHEST  2 VIEW  COMPARISON:  01/28/2014  FINDINGS: Scattered coarse peripheral interstitial accentuation in the lung apices and lung bases. There is some mild associated nodularity bilaterally. Bandlike airspace opacities in the lung bases, similar to prior. Scattered calcified pleural plaques.  Mild to moderate enlargement of the cardiopericardial silhouette atherosclerotic aortic arch. No significant pleural effusion.  IMPRESSION: 1. Essentially stable appearance of scattered calcified pleural plaques, interstitial accentuation raising the possibility of fibrosis, and peripheral airspace opacities in the lung bases. 2. Mild to moderate enlargement of the cardiopericardial silhouette.   Electronically Signed   By: Sherryl Barters M.D.   On: 04/17/2014 15:36    ASSESSMENT AND PLAN  1. SOB/Weakness/Fatigue - unclear etiology  - Brilinta changed to plavix due to cost issue, will see if sx improve off brilinta  - decrease Lipitor dose to 40mg  daily. Check LFT  - pending Lexiscan stress test today   2. SVT   - s/p catheter ablation 01/16/2014  3. CAD   - s/p cath 01/17/2014 severe 2 vessel CAD with 100% occluded RCA, 80-90% lesion in prox and mid LCx treated with Promus Premier DES 3.43mmx38mm postdilated to 3.43mm, residual ostial 70% stenosis in small marginal jailed by stent  - continue ASA and plavix  4. COPD 5. HTN: on metoprolol and amlodipine   Curtis [name] Curtis [name] Pager: [phone]  I have seen and evaluated the patient this PM along with Curtis [name] , PA. I agree with his findings, examination as well as impression recommendations. He felt well for ~ 1 month post PCI. Very confusing symptoms & very poor historian.  Had Myoview done today (s/p PCI to LCx with jailed sm-mod OM, & known RCA  CTO) -- Sx are exertional dyspnea & not angina.  Would only consider re-look cath for ST with significant risk.  Otherwise - would reduce statin & convert to Plavix  For possible myalgia & dyspnea.   He has GI & Urologic Sx - so not truly sure what the next step is.  May need additional opinions, unfortunately, he is jujst not a good enough historian to figure out what is happening.  May consider backing off of BB also for ?fatigue  Fran Mcree W, M.D., M.S. Interventional Cardiologist   Pager # [phone]

## 2014-04-19 ENCOUNTER — Encounter (HOSPITAL_COMMUNITY): Payer: Self-pay | Admitting: Radiology

## 2014-04-19 ENCOUNTER — Observation Stay (HOSPITAL_COMMUNITY): Payer: Medicare Other

## 2014-04-19 DIAGNOSIS — I10 Essential (primary) hypertension: Secondary | ICD-10-CM

## 2014-04-19 DIAGNOSIS — I25118 Atherosclerotic heart disease of native coronary artery with other forms of angina pectoris: Secondary | ICD-10-CM

## 2014-04-19 DIAGNOSIS — R0600 Dyspnea, unspecified: Secondary | ICD-10-CM | POA: Diagnosis not present

## 2014-04-19 DIAGNOSIS — R109 Unspecified abdominal pain: Secondary | ICD-10-CM

## 2014-04-19 DIAGNOSIS — E785 Hyperlipidemia, unspecified: Secondary | ICD-10-CM

## 2014-04-19 MED ORDER — METOCLOPRAMIDE HCL 5 MG/ML IJ SOLN
10.0000 mg | Freq: Four times a day (QID) | INTRAMUSCULAR | Status: AC
  Administered 2014-04-19 – 2014-04-20 (×4): 10 mg via INTRAVENOUS
  Filled 2014-04-19 (×4): qty 2

## 2014-04-19 MED ORDER — IOHEXOL 300 MG/ML  SOLN
25.0000 mL | INTRAMUSCULAR | Status: AC
  Administered 2014-04-19: 25 mL via ORAL

## 2014-04-19 MED ORDER — IOHEXOL 300 MG/ML  SOLN
100.0000 mL | Freq: Once | INTRAMUSCULAR | Status: AC | PRN
  Administered 2014-04-19: 100 mL via INTRAVENOUS

## 2014-04-19 MED ORDER — METOCLOPRAMIDE HCL 5 MG/ML IJ SOLN
10.0000 mg | Freq: Four times a day (QID) | INTRAMUSCULAR | Status: DC
  Filled 2014-04-19 (×3): qty 2

## 2014-04-19 MED ORDER — PANTOPRAZOLE SODIUM 40 MG PO TBEC
40.0000 mg | DELAYED_RELEASE_TABLET | Freq: Every day | ORAL | Status: DC
  Administered 2014-04-19 – 2014-04-20 (×2): 40 mg via ORAL
  Filled 2014-04-19 (×2): qty 1

## 2014-04-19 NOTE — Progress Notes (Signed)
Subjective: No chest pain, + abd pain, he thought he was dying-per pt hx of irritable bowel  But today was the worst.  abd pain improved some with small BM  Objective: Vital signs in last 24 hours: Temp:  [97.5 F (36.4 C)-97.9 F (36.6 C)] 97.6 F (36.4 C) (12/16 1356) Pulse Rate:  [75-91] 75 (12/16 1356) Resp:  [18-20] 18 (12/16 1356) BP: (115-167)/(57-80) 149/67 mmHg (12/16 1356) SpO2:  [97 %-100 %] 97 % (12/16 1356) Weight change:  Last BM Date: 04/18/14 Intake/Output from previous day: +140 12/15 0701 - 12/16 0700 In: 240 [P.O.:240] Out: 100 [Urine:100] Intake/Output this shift: Total I/O In: 240 [P.O.:240] Out: -   PE: General:Pleasant affect, NAD, but worried about his stomach and abd pain Skin:Warm and dry, brisk capillary refill HEENT:normocephalic, sclera clear, mucus membranes moist Heart:S1S2 RRR without murmur, gallup, rub or click Lungs:clear without rales, rhonchi, or wheezes WCH:ENID, + diffuse tenderness throughout, + BS, do not palpate liver spleen or masses Ext:no lower ext edema, 2+ pedal pulses, 2+ radial pulses Neuro:alert and oriented X 3, MAE, follows commands, + facial symmetry   Lab Results:  Recent Labs  04/17/14 1422  WBC 7.1  HGB 10.8*  HCT 32.8*  PLT 255   BMET  Recent Labs  04/17/14 1422 04/18/14 1600  NA 141 140  K 4.9 4.2  CL 103 100  CO2 24 24  GLUCOSE 105* 123*  BUN 21 18  CREATININE 1.13 1.21  CALCIUM 10.1 9.8    Recent Labs  04/18/14 0306 04/18/14 0857  TROPONINI <0.30 <0.30    Lab Results  Component Value Date   CHOL 102 04/18/2014   HDL 24* 04/18/2014   LDLCALC 34 04/18/2014   LDLDIRECT 43.7 04/03/2014   TRIG 222* 04/18/2014   CHOLHDL 4.3 04/18/2014   Lab Results  Component Value Date   HGBA1C 6.2* 09/07/2013     Lab Results  Component Value Date   TSH 2.380 01/16/2014    Hepatic Function Panel  Recent Labs  04/18/14 1600  PROT 8.0  ALBUMIN 3.9  AST 19  ALT 17  ALKPHOS  105  BILITOT 0.5  BILIDIR <0.2  IBILI NOT CALCULATED    Recent Labs  04/18/14 0306  CHOL 102   No results for input(s): PROTIME in the last 72 hours.     Studies/Results: Dg Chest 2 View  04/17/2014   CLINICAL DATA:  Chest pain.  Shortness of breath.  EXAM: CHEST  2 VIEW  COMPARISON:  01/28/2014  FINDINGS: Scattered coarse peripheral interstitial accentuation in the lung apices and lung bases. There is some mild associated nodularity bilaterally. Bandlike airspace opacities in the lung bases, similar to prior. Scattered calcified pleural plaques.  Mild to moderate enlargement of the cardiopericardial silhouette atherosclerotic aortic arch. No significant pleural effusion.  IMPRESSION: 1. Essentially stable appearance of scattered calcified pleural plaques, interstitial accentuation raising the possibility of fibrosis, and peripheral airspace opacities in the lung bases. 2. Mild to moderate enlargement of the cardiopericardial silhouette.   Electronically Signed   By: Sherryl Barters M.D.   On: 04/17/2014 15:36   Nm Myocar Multi W/spect W/wall Motion / Ef  04/18/2014   CLINICAL DATA:  Shortness of breath. History of peripheral vascular disease, pulmonary asbestosis, hypertension and GERD.  EXAM: MYOCARDIAL IMAGING WITH SPECT (REST AND PHARMACOLOGIC-STRESS)  GATED LEFT VENTRICULAR WALL MOTION STUDY  LEFT VENTRICULAR EJECTION FRACTION  TECHNIQUE: Standard myocardial SPECT imaging was performed after resting intravenous  injection of 10 mCi Tc-76m sestamibi. Subsequently, intravenous infusion of Lexiscan was performed under the supervision of the Cardiology staff. At peak effect of the drug, 30 mCi Tc-24m sestamibi was injected intravenously and standard myocardial SPECT imaging was performed. Quantitative gated imaging was also performed to evaluate left ventricular wall motion, and estimate left ventricular ejection fraction.  COMPARISON:  Chest radiograph - 04/17/2014  FINDINGS: Raw images:  There is no significant patient motion artifact on either the provided rest or stress images. There is mild GI attenuation seen on both the provided rest and stress images.  Perfusion: There is mild matched attenuation involving the inferior wall of the left ventricle on both the provided rest and stress images which is without associated regional wall motion abnormality. There is otherwise relative homogeneous distribution of injected radiotracer without definitive matched or mismatched filling defect to suggest prior infarction or pharmacologically induced ischemia.  Wall Motion: Normal left ventricular wall motion. No left ventricular dilation.  Left Ventricular Ejection Fraction: 67 %  End diastolic volume 54 ml  End systolic volume 18 ml  IMPRESSION: 1. No scintigraphic evidence of prior infarction or pharmacologically induced ischemia.  2. Normal left ventricular wall motion.  3. Left ventricular ejection fraction 67%  4. Low-risk stress test findings*.  *2012 Appropriate Use Criteria for Coronary Revascularization Focused Update: J Am Coll Cardiol. 8527;78(2):423-536. http://content.[email or web address].aspx?articleid=[email or web address]   Electronically Signed   By: Sandi Mariscal M.D.   On: 04/18/2014 16:49    Medications: I have reviewed the patient's current medications. Scheduled Meds: . alprazolam  2 mg Oral BID  . amitriptyline  75 mg Oral QHS  . amLODipine  2.5 mg Oral Daily  . aspirin  81 mg Oral Daily  . atorvastatin  40 mg Oral q1800  . clopidogrel  75 mg Oral Daily  . enoxaparin (LOVENOX) injection  40 mg Subcutaneous Q24H  . finasteride  5 mg Oral Daily  . gabapentin  300 mg Oral TID  . lubiprostone  24 mcg Oral BID WC  . metoprolol  25 mg Oral BID  . tamsulosin  0.4 mg Oral QHS   Continuous Infusions:  PRN Meds:.acetaminophen, alum & mag hydroxide-simeth, loperamide, nitroGLYCERIN, ondansetron (ZOFRAN) IV  Assessment/Plan: 1. SOB/Weakness/Fatigue - unclear etiology -  Brilinta changed to plavix due to cost issue, will see if sx improve off brilinta - decrease Lipitor dose to 40mg  daily. Check LFT - neg Lexiscan stress test  2. SVT - s/p catheter ablation 01/16/2014--maintaining SR with wandering P wave  3. CAD  - s/p cath 01/17/2014 severe 2 vessel CAD with 100% occluded RCA, 80-90% lesion in prox and mid LCx treated with Promus Premier DES 3.75mmx38mm postdilated to 3.31mm, residual ostial 70% stenosis in small marginal jailed by stent - continue ASA and plavix           - neg nuc  4. COPD 5. HTN: on metoprolol and amlodipine, BP elevated today with abd pain 6.  abd pain- the worst pain he has had, last saw [name] 3 years ago -he does have GERD, had a Nissen fundoplication in 1443. Last colonoscopy 3.12 with mild diverticulosis of lt colon. His last upper endoscopy in 2006 showed a functioning Nissen fundoplication and bile reflux. He was dilated with a 54 [place] dilator. - GI consult  With [facility] he is followed by [name]   -he is on amitiza for chronic constipation but no PPI - with hx of GERD and IBS will add protonix as well now  he has been on Brilinta and now changed to Plavix.  Dr. Roni Bread has seen and we have ordered abd ct with and without contrast.     LOS: 2 days   Time spent with pt. :15 minutes. [facility]  Nurse Practitioner Certified Pager [phone] or after 5pm and on weekends call [phone] 04/19/2014, 2:14 PM   I have seen, examined and evaluated the patient this PM along with Cecilie Kicks, NP.  After reviewing all the available data and chart,  I agree with her findings, examination as well as impression recommendations.  Admitted for a strange compilation of Sx - admitted to Cardiology b/c h/o CAD & reported DOE, but he has been complaining more of Abdominal discomfort & poor appetite / inability to eat >> dyspnea.  He has a complicated GI  history.  Myoview read late yesterday suggests no evidence of ischemia.  There is a fixed inferior perfusion defect that probably correlates to known occluded RCA (~ partial thickness infarct) - but no reversibility.  Normal EF.    At this point, he we have converted Brilinta to Plavix for possible respiratory effect.  We have also reduced Statin dose (by report, he "started going down-hill after being converted to atorvastatin from simvastatin).  With his persistent GI complaints & abdominal pain - have ordered Abd CT w/ & w/o Contrast -- ? Issue with prior Nissen vs. Diverticulosis/itis vs irritable bowel.  Will consult GI.   If further inpateint w/u is recommended beyond tomorrow, will Ask to transfer primary team to [facility].     Leonie Man, M.D., M.S. Interventional Cardiologist   Pager # [phone]

## 2014-04-19 NOTE — Consult Note (Signed)
[facility] Consult: 3:09 PM 04/19/2014  LOS: 2 days    Referring Provider: Dr. Lovena Le  Primary Care Physician:  [name], MD Primary Gastroenterologist:  Dr. Delfin Edis     Reason for Consultation:  Abdominal pain   HPI: Curtis [name] is a 68 y.o. male.  PMH of reflex sympathetic dystrophy, COPD and asbestosis (for which he is on disability).  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 S/p 01/17/14 cardiac cath with drug-eluting stent placement to tandem cfx lesions; RCA ostiawas 100% occluded.  On Brilinta.  Treated for bronchitis with prednisone for after 01/28/14 ED visit.  No evidence for pneumonia on x-ray then.  History IBS, chronic constipation. diverticulosis, diverticulitis with microperforation in 12/2009., GERD. Status post remote Nissen fundoplication of hiatal hernia ~ 1994. Previous GI endoscopies studies include 07/23/2010 colonoscopy for family history of maternal colon cancer. Previous colonoscopy 1994 normal. Mild sigmoid diverticulosis noted. 10/03/2004 EGD for dysphagia and reflux symptoms. Functioning Nissen fundoplication with no obvious stricture noted. Patient empirically dilated with 54 mm Maloney dilator. Retained food noted in the stomach.  Admitted 2 days ago with dyspnea which has been progressive since his visit to the ED on 9/26.  He has developed malaise, fatigue and inability to perform usual tasks.  It is difficult for the patient to sort out his dyspnea from any chest pain. Troponins are negative. Myoview study 04/18/14. His symptoms are felt to be dyspneic in origin and not to represent angina. His Brilinta has been changed to Plavix  due to excessive cost of Brilinta. The dose of his statin has been cut in half as this is a possible cause of his myalgias. PPI therapy has been added. There is also possibility of lowering his dose of beta blocker to try to address his fatigue. Dr. Ellyn Hack notes patient is a difficult historian and sorting out his specific complaints is problematic.  Today the patient is complaining of worsening of chronic mid to lower abdominal pain, which improved somewhat after a small bowel movement. His LFTs are normal.  Last week his PMD added Amitiza but thus far the patient has not had significant improvement in his constipation or his abdominal discomfort. He continues to have small, infrequent bowel movements. He has not seen any blood or melena in the stool. He also has reflux without significant pyrosis. If he eats or drinks more than small to moderate volumes, he gets reflux with liquid regurgitating into his mouth. CT scan of abdomen/pelvis has been ordered.  Past Medical History  Diagnosis Date  . Arthritis   . SOB (shortness of breath)   . Pulmonary asbestosis   . Hernia of unspecified site of abdominal cavity without mention of obstruction or gangrene     hiatal  . GERD (gastroesophageal reflux disease)   . Depression   . Diverticulitis     hospital 2011 [facility]  . Diverticulosis   . Reflex sympathetic dystrophy   .  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 Hypertension   . IBS (irritable bowel syndrome)   . Peripheral vascular disease     ankle brachial index of 0.79 on the right and 0.61 on the left.   . Myocardial infarction 01/2014    NSTEMI    Past Surgical History  Procedure Laterality Date  . Nissen fundoplication    . Right elbow surgery      x 2  . Right knee arthroscopy    . Left shoulder       x 3  . Squamous cell skin cancer      Left Hand  . Cataract extraction    . Supraventricular tachycardia ablation N/A 01/16/2014    Procedure: SUPRAVENTRICULAR TACHYCARDIA ABLATION;  Surgeon: Evans Lance, MD;  Location: [facility] CATH LAB;  Service: Cardiovascular;  Laterality: N/A;  . Left heart catheterization with coronary angiogram N/A 01/17/2014    Procedure: LEFT HEART CATHETERIZATION WITH CORONARY ANGIOGRAM;  Surgeon: Leonie Man, MD;  Location: [facility] CATH LAB;  Service: Cardiovascular;  Laterality: N/A;    Prior to Admission medications   Medication Sig Start Date End Date Taking? Authorizing Provider  alprazolam [name]) 2 MG tablet Take 2 mg by mouth 2 (two) times daily.   Yes Historical Provider, MD  amitriptyline (ELAVIL) 75 MG tablet Take 75 mg by mouth at bedtime.   Yes Historical Provider, MD  amLODipine (NORVASC) 2.5 MG tablet Take 1 tablet (2.5 mg total) by mouth daily. 02/01/14  Yes [name], PA-C  aspirin 81 MG chewable tablet Chew 1 tablet (81 mg total) by mouth daily. 01/20/14  Yes [name], NP  atorvastatin (LIPITOR) 80 MG tablet Take 1 tablet (80 mg total) by mouth daily. 02/01/14  Yes [name], PA-C  finasteride (PROSCAR) 5 MG tablet Take 5 mg by mouth every morning.    Yes Historical Provider, MD  gabapentin (NEURONTIN) 300 MG capsule Take 300 mg by mouth 3 (three) times daily.    Yes Historical Provider, MD  lubiprostone (AMITIZA) 24 MCG capsule Take 24 mcg by mouth 2 (two) times daily with a meal.   Yes Historical Provider, MD  metoprolol (LOPRESSOR) 50 MG tablet Take 25 mg by mouth 2 (two) times daily.   Yes Historical Provider, MD  tamsulosin (FLOMAX) 0.4 MG CAPS capsule Take 1 capsule (0.4 mg total) by mouth daily. 09/08/13  Yes [name], MD  ticagrelor (BRILINTA) 90 MG TABS tablet Take 1 tablet (90 mg total) by mouth 2 (two) times daily. 02/01/14  Yes [name], PA-C    Scheduled Meds: . alprazolam  2 mg Oral BID  . amitriptyline  75 mg Oral QHS  . amLODipine  2.5 mg Oral Daily  . aspirin  81 mg Oral Daily  . atorvastatin  40 mg Oral q1800  . clopidogrel  75 mg Oral Daily  . enoxaparin (LOVENOX) injection  40 mg Subcutaneous Q24H  .  finasteride  5 mg Oral Daily  . gabapentin  300 mg Oral TID  . iohexol  25 mL Oral Q1 Hr x 2  . lubiprostone  24 mcg Oral BID WC  . metoprolol  25 mg Oral BID  . pantoprazole  40 mg Oral Daily  . tamsulosin  0.4 mg Oral QHS   Infusions:   PRN Meds: acetaminophen, alum & mag hydroxide-simeth, loperamide, nitroGLYCERIN, ondansetron (ZOFRAN) IV   Allergies as of 04/17/2014 - Review Complete 04/17/2014  Allergen Reaction Noted  . Codeine Itching  Family History  Problem Relation Age of Onset  . Colon cancer Mother   . Colon cancer Maternal Aunt   . Colon cancer Maternal Uncle   . Heart attack Neg Hx   . Stroke Neg Hx     History   Social History  . Marital Status: Married    Spouse Name: N/A    Number of Children: N/A  . Years of Education: N/A   Occupational History  . Veteran    Social History Main Topics  . Smoking status: Former Smoker -- 2.50 packs/day for 50 years    Types: Cigarettes    Quit date: 05/05/2005  . Smokeless tobacco: Former Systems developer    Quit date: 05/03/2012  . Alcohol Use: No     Comment: last use 2013  . Drug Use: No  . Sexual Activity: Not on file     Comment: widowed, Veteran   Other Topics Concern  . Not on file   Social History Narrative    REVIEW OF SYSTEMS: Constitutional:  General malaise and weakness. ENT:  No nose bleeds Pulm:  Dyspnea has improved within the last couple of days. There is no purulent sputum CV:  No palpitations, no LE edema.  GU:  No hematuria, no frequency GI:  Per history of present illness Heme:  No history of unusual bruising or bleeding.   Transfusions:  The only transfusions he recalls were at the time of lung biopsy in 2007 Neuro:  No headaches, no peripheral tingling or numbness MS:  Pain in feet with walking. Derm:  No itching, no rash or sores.  Endocrine:  No sweats or chills.  No polyuria or dysuria Immunization:  All immunizations have been provided by his PMD, no records found in  [place]. Travel:  None beyond local counties in last few months.    PHYSICAL EXAM: Vital signs in last 24 hours: Filed Vitals:   04/19/14 1356  BP: 149/67  Pulse: 75  Temp: 97.6 F (36.4 C)  Resp: 18   Wt Readings from Last 3 Encounters:  04/17/14 191 lb 12.8 oz (87 kg)  03/06/14 199 lb (90.266 kg)  02/01/14 199 lb (90.266 kg)    General: Pleasant, comfortable, somewhat chronically ill appearing WF who appears stated age. Head:  No asymmetry, no facial edema, no stigmata of head trauma.  Eyes:  No icterus or conjunctival pallor. Ears:  Slightly hard of hearing.  Nose:  No congestion or discharge Mouth:  Clear, moist, no lesions or exudates Neck:  No mass, no JVD, no TMG. Lungs:  No dyspnea or cough.  Breath sounds decreased in bases. Heart: RRR. No MRG. S1/S2 audible Abdomen:  Not distended, bowel sounds hypoactive active but no tympanitic or tinkling bowel sounds. There is some mild to moderate tenderness without guarding or rebound located in the lower abdomen bilaterally. No hernias.   Rectal: There is no palpable stool in the rectal vault. No masses. Some brown stool visible on glove was not Hemoccult tested.   Musc/Skeltl: No joint erythema, swelling or contractures. Extremities:  No lower extremity/pedal edema. Feet are warm with brisk capillary refill.  Neurologic:  Oriented 3. Limb strength symmetric and full. No tremors Skin:  No telangiectasia, no significant hematomas. No sores or rash Tattoos:  None observed. Nodes:  No cervical adenopathy   Psych:  Cooperative, not appear depressed or anxious.  Intake/Output from previous day: 12/15 0701 - 12/16 0700 In: 240 [P.O.:240] Out: 100 [Urine:100] Intake/Output this shift: Total I/O In: 240 [P.O.:240]  Out: -   LAB RESULTS:  Recent Labs  04/17/14 1422  WBC 7.1  HGB 10.8*  HCT 32.8*  PLT 255   BMET Lab Results  Component Value Date   NA 140 04/18/2014   NA 141 04/17/2014   NA 137 01/28/2014   K 4.2  04/18/2014   K 4.9 04/17/2014   K 4.8 01/28/2014   CL 100 04/18/2014   CL 103 04/17/2014   CL 98 01/28/2014   CO2 24 04/18/2014   CO2 24 04/17/2014   CO2 25 01/28/2014   GLUCOSE 123* 04/18/2014   GLUCOSE 105* 04/17/2014   GLUCOSE 107* 01/28/2014   BUN 18 04/18/2014   BUN 21 04/17/2014   BUN 13 01/28/2014   CREATININE 1.21 04/18/2014   CREATININE 1.13 04/17/2014   CREATININE 1.24 01/28/2014   CALCIUM 9.8 04/18/2014   CALCIUM 10.1 04/17/2014   CALCIUM 10.1 01/28/2014   LFT  Recent Labs  04/18/14 1600  PROT 8.0  ALBUMIN 3.9  AST 19  ALT 17  ALKPHOS 105  BILITOT 0.5  BILIDIR <0.2  IBILI NOT CALCULATED   PT/INR Lab Results  Component Value Date   INR 0.92 01/16/2014   Hepatitis Panel No results for input(s): HEPBSAG, HCVAB, HEPAIGM, HEPBIGM in the last 72 hours. C-Diff No components found for: CDIFF Lipase  No results found for: LIPASE  Drugs of Abuse  No results found for: LABOPIA, COCAINSCRNUR, LABBENZ, AMPHETMU, THCU, LABBARB   RADIOLOGY STUDIES: Dg Chest 2 View  04/17/2014   CLINICAL DATA:  Chest pain.  Shortness of breath.  EXAM: CHEST  2 VIEW  COMPARISON:  01/28/2014  FINDINGS: Scattered coarse peripheral interstitial accentuation in the lung apices and lung bases. There is some mild associated nodularity bilaterally. Bandlike airspace opacities in the lung bases, similar to prior. Scattered calcified pleural plaques.  Mild to moderate enlargement of the cardiopericardial silhouette atherosclerotic aortic arch. No significant pleural effusion.  IMPRESSION: 1. Essentially stable appearance of scattered calcified pleural plaques, interstitial accentuation raising the possibility of fibrosis, and peripheral airspace opacities in the lung bases. 2. Mild to moderate enlargement of the cardiopericardial silhouette.   Electronically Signed   By: Sherryl Barters M.D.   On: 04/17/2014 15:36   Nm Myocar Multi W/spect W/wall Motion / Ef  04/18/2014   CLINICAL DATA:   Shortness of breath. History of peripheral vascular disease, pulmonary asbestosis, hypertension and GERD.  EXAM: MYOCARDIAL IMAGING WITH SPECT (REST AND PHARMACOLOGIC-STRESS)  GATED LEFT VENTRICULAR WALL MOTION STUDY  LEFT VENTRICULAR EJECTION FRACTION  TECHNIQUE: Standard myocardial SPECT imaging was performed after resting intravenous injection of 10 mCi Tc-1m sestamibi. Subsequently, intravenous infusion of Lexiscan was performed under the supervision of the Cardiology staff. At peak effect of the drug, 30 mCi Tc-60m sestamibi was injected intravenously and standard myocardial SPECT imaging was performed. Quantitative gated imaging was also performed to evaluate left ventricular wall motion, and estimate left ventricular ejection fraction.  COMPARISON:  Chest radiograph - 04/17/2014  FINDINGS: Raw images: There is no significant patient motion artifact on either the provided rest or stress images. There is mild GI attenuation seen on both the provided rest and stress images.  Perfusion: There is mild matched attenuation involving the inferior wall of the left ventricle on both the provided rest and stress images which is without associated regional wall motion abnormality. There is otherwise relative homogeneous distribution of injected radiotracer without definitive matched or mismatched filling defect to suggest prior infarction or pharmacologically induced ischemia.  Wall Motion: Normal  left ventricular wall motion. No left ventricular dilation.  Left Ventricular Ejection Fraction: 67 %  End diastolic volume 54 ml  End systolic volume 18 ml  IMPRESSION: 1. No scintigraphic evidence of prior infarction or pharmacologically induced ischemia.  2. Normal left ventricular wall motion.  3. Left ventricular ejection fraction 67%  4. Low-risk stress test findings*.  *2012 Appropriate Use Criteria for Coronary Revascularization Focused Update: J Am Coll Cardiol. 6712;45(8):099-833.  http://content.airportbarriers.com.aspx?articleid=[email or web address]   Electronically Signed   By: Sandi Mariscal M.D.   On: 04/18/2014 16:49    ENDOSCOPIC STUDIES: See above in HPI  IMPRESSION:   *  Chronic abdominal pain for many years. Carries diagnosis of IBS. His complaints do sound functional. He has chronic constipation. Thus far the recent trial of Amitiza is conferring little benefit.  The amitriptyline, which he takes at bedtime, as well as Flomax, may be contributing to his constipation. History of diverticulitis in 2011. Current systems are not consistent with previous diverticulitis.   *  Early satiety and reflux. Patient not on PPI at home but it has been added today. Had retained food on 2006 EGD, suspect gastroparesis.  *  Status post Nissen fundoplication ~ 8250.   *  Status post cardioversion and AV nodal ablation 01/16/2014.  *  Status post cardiac cath with DES placement 01/17/2014. Has been on Proventil into but due to its high cost this has just been changed to Plavix.   *  Normocytic anemia.  In May 2015 hemoglobin was 12-1/2. On admission in September it ranged from 13.62 at admission to 11.4 at discharge following cardiac interventions. Now it is 10.8.  *  3.1 cm infrarenal AAA per CT of August 2011. No imaging of this since then.    PLAN:     *  Agree with the ordered CT abdomen/pelvis. This may not provide any information as to the source of his abdominal pain but his infrarenal AAA needs to be reassessed.  Unfortunately, the patient is having trouble tolerating the oral contrast at present. I will add Reglan as this may allow him to consume the required oral contrast. If not he may need administration of rectal contrast May benefit from barium esophagram and/or gastric emptying study to assess esophageal function and assess for gastroparesis.  *  If the Amitiza continues to prove ineffective, would discontinue this medication.  I discontinued the orders for prn  Imodium.    [name]  04/19/2014, 3:09 PM Pager: [phone]     ________________________________________________________________________  Velora Heckler GI MD note:  I personally examined the patient, reviewed the data and agree with the assessment and plan described above.  Await CT scan IV and PO contrast. He's had abd pains chronically, doesn't seem much worse than his usual functional pains now.     Owens Loffler, MD [facility] Pager [phone]

## 2014-04-19 NOTE — Progress Notes (Signed)
UR completed 

## 2014-04-20 ENCOUNTER — Encounter (HOSPITAL_COMMUNITY): Payer: Self-pay | Admitting: Cardiology

## 2014-04-20 DIAGNOSIS — K219 Gastro-esophageal reflux disease without esophagitis: Secondary | ICD-10-CM

## 2014-04-20 DIAGNOSIS — R1084 Generalized abdominal pain: Secondary | ICD-10-CM | POA: Insufficient documentation

## 2014-04-20 DIAGNOSIS — R079 Chest pain, unspecified: Secondary | ICD-10-CM | POA: Insufficient documentation

## 2014-04-20 DIAGNOSIS — R0789 Other chest pain: Secondary | ICD-10-CM

## 2014-04-20 DIAGNOSIS — I714 Abdominal aortic aneurysm, without rupture, unspecified: Secondary | ICD-10-CM

## 2014-04-20 DIAGNOSIS — E785 Hyperlipidemia, unspecified: Secondary | ICD-10-CM

## 2014-04-20 DIAGNOSIS — R0600 Dyspnea, unspecified: Secondary | ICD-10-CM | POA: Diagnosis not present

## 2014-04-20 DIAGNOSIS — R0602 Shortness of breath: Secondary | ICD-10-CM

## 2014-04-20 DIAGNOSIS — I471 Supraventricular tachycardia: Secondary | ICD-10-CM

## 2014-04-20 HISTORY — DX: Abdominal aortic aneurysm, without rupture: I71.4

## 2014-04-20 HISTORY — DX: Abdominal aortic aneurysm, without rupture, unspecified: I71.40

## 2014-04-20 HISTORY — DX: Hyperlipidemia, unspecified: E78.5

## 2014-04-20 MED ORDER — ATORVASTATIN CALCIUM 40 MG PO TABS: 40.0000 mg | ORAL_TABLET | Freq: Every day | ORAL | Status: DC

## 2014-04-20 MED ORDER — NITROGLYCERIN 0.4 MG SL SUBL: 0.4000 mg | SUBLINGUAL_TABLET | SUBLINGUAL | Status: DC | PRN

## 2014-04-20 MED ORDER — PANTOPRAZOLE SODIUM 40 MG PO TBEC: 40.0000 mg | DELAYED_RELEASE_TABLET | Freq: Every day | ORAL | Status: DC

## 2014-04-20 MED ORDER — CLOPIDOGREL BISULFATE 75 MG PO TABS: 75.0000 mg | ORAL_TABLET | Freq: Every day | ORAL | Status: DC

## 2014-04-20 NOTE — Progress Notes (Signed)
Pt ambulated hall with standby assist. Hr 108-113 while ambulating.

## 2014-04-20 NOTE — Discharge Summary (Signed)
Physician Discharge Summary       Patient ID: Curtis [name] MRN: [id number] DOB/AGE: Oct 12, 1945 67 y.o.  Admit date: 04/17/2014 Discharge date: 04/20/2014   Primary Cardiologist: Dr. Burt Knack   Discharge Diagnoses:  Principal Problem:   DOE (dyspnea on exertion), may be Brilinta this was stopped changed to Plavix Active Problems:   CAD (coronary artery disease), native coronary artery   GERD   Reflex sympathetic dystrophy   SVT (supraventricular tachycardia) s/p ablation 01/16/2014   Hyperlipidemia LDL goal <70   AAA (abdominal aortic aneurysm), stable   Discharged Condition: good  Procedures: none  Hospital Course:  68 year old male is a very rambling historian but presents to the emergency room with progressive shortness of breath and chest discomfort. He has a complex history of reflex sympathetic dystrophy, COPD and asbestosis first which he is on disability. He presented to the emergency room in September with SVT requiring cardioversion. He had elevation of troponin and prior to this 24 hours of chest pain. He subsequently had an AV nodal reentry tachycardia ablated by Dr. Lovena Le in the next he underwent cardiac catheterization that showed an occluded right coronary artery and severe proximal circumflex stenosis. He had placement of a Promus 3.5 x 38 mm stent postdilated to 3.75 mm. He evidently had a 70% ostial stenosis of the marginal branch. The was unable to dilate and this was just treated medically. According to the family, he did quite well going home and on his first follow-up visit was placed on amlodipine as well as beta blockers. He was taken off simvastatin and placed on high-dose atorvastatin.. Since then, he states he has gone downhill. He had an emergency room visit for chest discomfort and also had some shortness of breath that was attributed to bronchitis and treated with prednisone. He has had progressive shortness of breath since then and presented  to the emergency room today with fatigue, malaise, progressive shortness of breath and inability to do activities. It is very difficult to tell whether the chest pain is anginal in nature or not. Initial troponin was negative. The major complaint is dyspnea and in light of his progressive symptoms. He is brought into the hospital for evaluation.  His Brilinta was changed to Plavix and SOB has improved and there had been a cost issue.  His lipitor was decreased to 40 mg.  He did feel better, and his troponins have been negative.  He underwent a lexiscan myoview that was without ischemia.  He was reassured.  By the 16th he had "the worst abd. Pain he had ever had."  He has hx of nissen fundoplication and chronic gastritis and was no longer on his PPI- prilosec.  He stated it was not working well for him.  We placed on protonix and did CT abd.  GI consult was also obtained, it had been 3 years since he had seen [name].  Reglan and protonix seemed to control pain and his chronic nausea was also decreased the next day.  Follow up with GI and they recommended continuing protonix, no reglan, and continue Amitiza for constipation, though if not helping he may stop.    He was improved and seen and evaluated by Dr. Roni Bread found stable for discharge.   Also hx of AAA on CT it was small.  Consults: GI  Significant Diagnostic Studies:  BMET    Component Value Date/Time   NA 140 04/18/2014 1600   K 4.2 04/18/2014 1600   CL 100 04/18/2014 1600  CO2 24 04/18/2014 1600   GLUCOSE 123* 04/18/2014 1600   BUN 18 04/18/2014 1600   CREATININE 1.21 04/18/2014 1600   CALCIUM 9.8 04/18/2014 1600   GFRNONAA 59* 04/18/2014 1600   GFRAA 69* 04/18/2014 1600     CBC    Component Value Date/Time   WBC 7.1 04/17/2014 1422   RBC 3.70* 04/17/2014 1422   HGB 10.8* 04/17/2014 1422   HCT 32.8* 04/17/2014 1422   PLT 255 04/17/2014 1422   MCV 88.6 04/17/2014 1422   MCH 29.2 04/17/2014 1422   MCHC 32.9  04/17/2014 1422   RDW 15.2 04/17/2014 1422   LYMPHSABS 1.8 01/16/2014 0303   MONOABS 1.3* 01/16/2014 0303   EOSABS 0.3 01/16/2014 0303   BASOSABS 0.1 01/16/2014 0303     troponin <0.30 X 4  Lipid Panel     Component Value Date/Time   CHOL 102 04/18/2014 0306   TRIG 222* 04/18/2014 0306   HDL 24* 04/18/2014 0306   CHOLHDL 4.3 04/18/2014 0306   VLDL 44* 04/18/2014 0306   LDLCALC 34 04/18/2014 0306   LDLDIRECT 43.7 04/03/2014 1020     CTA of ONG:EXBMWUXLKG: 12/22/2009 FINDINGS: Lower chest: Calcified pleural plaques at the lung bases.  Hepatobiliary: Liver is within normal limits.  Gallbladder is unremarkable. No intrahepatic or extrahepatic ductal dilatation.  Pancreas: Within normal limits.  Spleen: Within normal limits.  Adrenals/Urinary Tract: Adrenal glands are unremarkable.  Bilateral renal cortical atrophy. 11 mm right lower pole renal cyst (series 7/image 21). 7 mm probable left lower pole renal cyst (series 7/ image 21). No hydronephrosis.  Bladder is unremarkable.  Stomach/Bowel: Stomach is notable for postsurgical changes related to Nissen fundoplication (series 2/image 16). Additional wall thickening/edema in the region of the gastric antrum (series 2/image 27), nonspecific, although persistent on delayed imaging. Correlate for gastritis.  No evidence of bowel obstruction.  Appendix is not discretely visualized.  Sigmoid diverticulosis, without evidence of diverticulitis.  Vascular/Lymphatic: Atherosclerotic calcifications of the abdominal aorta and branch vessels. Ectasia of the infrarenal abdominal aorta, measuring up to 2.8 x 2.9 cm.  No suspicious abdominopelvic lymphadenopathy.  Reproductive: Prostate is unremarkable.  Other: No abdominopelvic ascites.  Musculoskeletal: Mild degenerative changes of the visualized thoracolumbar spine.  IMPRESSION: No evidence of bowel obstruction.  Wall thickening/edema in the  region of the gastric antrum, nonspecific. Correlate for gastritis.  Additional stable ancillary findings as above.  Lexiscan myoview: IMPRESSION: 1. No scintigraphic evidence of prior infarction or pharmacologically induced ischemia.  2. Normal left ventricular wall motion.  3. Left ventricular ejection fraction 67%  4. Low-risk stress test findings*.   CXR: CHEST 2 VIEW COMPARISON: 01/28/2014 FINDINGS: Scattered coarse peripheral interstitial accentuation in the lung apices and lung bases. There is some mild associated nodularity bilaterally. Bandlike airspace opacities in the lung bases, similar to prior. Scattered calcified pleural plaques.  Mild to moderate enlargement of the cardiopericardial silhouette atherosclerotic aortic arch. No significant pleural effusion.  IMPRESSION: 1. Essentially stable appearance of scattered calcified pleural plaques, interstitial accentuation raising the possibility of fibrosis, and peripheral airspace opacities in the lung bases. 2. Mild to moderate enlargement of the cardiopericardial silhouette.  Discharge Exam: Blood pressure 115/62, pulse 100, temperature 98.4 F (36.9 C), temperature source Oral, resp. rate 17, height 6' (1.829 m), weight 191 lb 12.8 oz (87 kg), SpO2 99 %.   Disposition: 01-Home or Self Care     Medication List    STOP taking these medications        ticagrelor 90 MG  Tabs tablet  Commonly known as:  BRILINTA      TAKE these medications        alprazolam 2 MG tablet  Commonly known as:  XANAX  Take 2 mg by mouth 2 (two) times daily.     amitriptyline 75 MG tablet  Commonly known as:  ELAVIL  Take 75 mg by mouth at bedtime.     amLODipine 2.5 MG tablet  Commonly known as:  NORVASC  Take 1 tablet (2.5 mg total) by mouth daily.     aspirin 81 MG chewable tablet  Chew 1 tablet (81 mg total) by mouth daily.     atorvastatin 40 MG tablet  Commonly known as:  LIPITOR  Take 1 tablet  (40 mg total) by mouth daily at 6 PM.     clopidogrel 75 MG tablet  Commonly known as:  PLAVIX  Take 1 tablet (75 mg total) by mouth daily.     finasteride 5 MG tablet  Commonly known as:  PROSCAR  Take 5 mg by mouth every morning.     gabapentin 300 MG capsule  Commonly known as:  NEURONTIN  Take 300 mg by mouth 3 (three) times daily.     lubiprostone 24 MCG capsule  Commonly known as:  AMITIZA  Take 24 mcg by mouth 2 (two) times daily with a meal.     metoprolol 50 MG tablet  Commonly known as:  LOPRESSOR  Take 25 mg by mouth 2 (two) times daily.     nitroGLYCERIN 0.4 MG SL tablet  Commonly known as:  NITROSTAT  Place 1 tablet (0.4 mg total) under the tongue every 5 (five) minutes x 3 doses as needed for chest pain.     pantoprazole 40 MG tablet  Commonly known as:  PROTONIX  Take 1 tablet (40 mg total) by mouth daily.     tamsulosin 0.4 MG Caps capsule  Commonly known as:  FLOMAX  Take 1 capsule (0.4 mg total) by mouth daily.       Follow-up Information    Follow up with Sherren Mocha, MD.   Specialty:  Cardiology   Why:  the office will call you to give you appt date and time with either Dr. Burt Knack or his PA/NP   Contact information:   [address] [place] [address] [phone]       Follow up with Delfin Edis, MD.   Specialty:  Gastroenterology   Why:  call if you have further problems with GI   Contact information:   [address] [place] [address] [phone]        Discharge Instructions: Heart Healthy diet small meals may be better than large meals  Continue Protonix, this would be safest med to take with plavix.  We stopped the Brilinta and changed to Plavix.  Call if further problems at home.   See Dr, Olevia Perches if further problems.  Signed: [name] Nurse Practitioner-Certified [facility]: [facility] 04/20/2014, 6:00 PM  Time spent on discharge : > 35 minutes.     I saw evaluated  the patient on the day of discharge. Please see my last note for details. He felt much better today when changed proton pump inhibitor. No further chest pain. He had a negative Myoview indicating nonischemic symptoms. We discussed with GI medicine and they anticipated that no further inpatient workup needs to be done. That indicates he is ready for discharge.  Leonie Man, M.D., M.S.  Interventional Cardiologist   Pager # [phone]

## 2014-04-20 NOTE — Progress Notes (Signed)
Discharge instructions given, pt verbalized understanding.  VSS. Denies pain. Pt waiting on transportation home.

## 2014-04-20 NOTE — Progress Notes (Signed)
Daily Rounding Note  04/20/2014, 8:10 AM  LOS: 3 days   SUBJECTIVE:       Was able to eat breakfast today and did not get nausea or have significant regurgitation.  OBJECTIVE:         Vital signs in last 24 hours:    Temp:  [97.6 F (36.4 C)-98.9 F (37.2 C)] 97.6 F (36.4 C) (12/17 0536) Pulse Rate:  [68-92] 68 (12/17 0536) Resp:  [18] 18 (12/17 0536) BP: (124-151)/(57-77) 124/57 mmHg (12/17 0536) SpO2:  [97 %-98 %] 98 % (12/17 0536) Last BM Date: 04/19/14 Filed Weights   04/17/14 2150  Weight: 191 lb 12.8 oz (87 kg)   General: Chronically unwell-appearing but not acutely ill. Comfortable   Heart: RRR.  No MRG. S1/S2 audible. Chest: Clear bilaterally. No cough or respiratory distress Abdomen: Soft, nontender, nondistended, active bowel sounds.  Extremities: No CCE. Neuro/Psych:  Pleasant, speech is slow . He is oriented 3.  No tremors.  Intake/Output from previous day: 12/16 0701 - 12/17 0700 In: 240 [P.O.:240] Out: -   Intake/Output this shift:    Lab Results:  Recent Labs  04/17/14 1422  WBC 7.1  HGB 10.8*  HCT 32.8*  PLT 255   BMET  Recent Labs  04/17/14 1422 04/18/14 1600  NA 141 140  K 4.9 4.2  CL 103 100  CO2 24 24  GLUCOSE 105* 123*  BUN 21 18  CREATININE 1.13 1.21  CALCIUM 10.1 9.8   LFT  Recent Labs  04/18/14 1600  PROT 8.0  ALBUMIN 3.9  AST 19  ALT 17  ALKPHOS 105  BILITOT 0.5  BILIDIR <0.2  IBILI NOT CALCULATED    Studies/Results: Ct Abdomen Pelvis W Contrast 04/19/2014  CONTRAST:  151mL OMNIPAQUE IOHEXOL 300 MG/ML  SOLN  COMPARISON:  12/22/2009  FINDINGS: Lower chest:  Calcified pleural plaques at the lung bases.  Hepatobiliary: Liver is within normal limits.  Gallbladder is unremarkable. No intrahepatic or extrahepatic ductal dilatation.  Pancreas: Within normal limits.  Spleen: Within normal limits.  Adrenals/Urinary Tract: Adrenal glands are unremarkable.   Bilateral renal cortical atrophy. 11 mm right lower pole renal cyst (series 7/image 21). 7 mm probable left lower pole renal cyst (series 7/ image 21). No hydronephrosis.  Bladder is unremarkable.  Stomach/Bowel: Stomach is notable for postsurgical changes related to Nissen fundoplication (series 2/image 16). Additional wall thickening/edema in the region of the gastric antrum (series 2/image 27), nonspecific, although persistent on delayed imaging. Correlate for gastritis.  No evidence of bowel obstruction.  Appendix is not discretely visualized.  Sigmoid diverticulosis, without evidence of diverticulitis.  Vascular/Lymphatic: Atherosclerotic calcifications of the abdominal aorta and branch vessels. Ectasia of the infrarenal abdominal aorta, measuring up to 2.8 x 2.9 cm.  No suspicious abdominopelvic lymphadenopathy.  Reproductive: Prostate is unremarkable.  Other: No abdominopelvic ascites.  Musculoskeletal: Mild degenerative changes of the visualized thoracolumbar spine.  IMPRESSION: No evidence of bowel obstruction.  Wall thickening/edema in the region of the gastric antrum, nonspecific. Correlate for gastritis.  Additional stable ancillary findings as above.   Electronically Signed   By: Julian Hy M.D.   On: 04/19/2014 20:06   Nm Myocar Multi W/spect W/wall Motion / Ef 04/18/2014  COMPARISON:  Chest radiograph - 04/17/2014  FINDINGS: Raw images: There is no significant patient motion artifact on either the provided rest or stress images. There is mild GI attenuation seen on both the provided rest and stress images.  Perfusion: There is mild matched attenuation involving the inferior wall of the left ventricle on both the provided rest and stress images which is without associated regional wall motion abnormality. There is otherwise relative homogeneous distribution of injected radiotracer without definitive matched or mismatched filling defect to suggest prior infarction or pharmacologically induced  ischemia.  Wall Motion: Normal left ventricular wall motion. No left ventricular dilation.  Left Ventricular Ejection Fraction: 67 %  End diastolic volume 54 ml  End systolic volume 18 ml  IMPRESSION: 1. No scintigraphic evidence of prior infarction or pharmacologically induced ischemia.  2. Normal left ventricular wall motion.  3. Left ventricular ejection fraction 67%  4. Low-risk stress test findings*.  *2012 Appropriate Use Criteria for Coronary Revascularization Focused Update: J Am Coll Cardiol. 9381;82(9):937-169. http://content.airportbarriers.com.aspx?articleid=1201161   Electronically Signed   By: Sandi Mariscal M.D.   On: 04/18/2014 16:49    ASSESMENT:   *  Chronic, functional abdominal pain with early satiety and regurgitation.  Hx IBS-C, chronic constipation.  CT abd/pelvis with gatric antral thickening.  Started on Protonix yesterday, none PTA. Last EGD was 2006:  Functioning Nissen, retained food.  Non-worrisome infrarenal aortic artery ectasia.  Symptoms are better now that Brilinta (known to have, mostly upper, GI side effects) has been discontinued along with addition of PPI (Protonix) and short-term Reglan.  *  Remote Nissen fundoplication ~6789.    *  Cardioversion, AVN ablation 01/16/14  *  DES 01/17/14   PLAN   *  At this point symptoms have improved so it is unlikely that we will pursue any further testing.  Given the not insignificant neurologic risks of chronic metoclopramide, will not continue this but will continue the Protonix or other PPI as an outpatient (he did say that omeprazole had stopped being effective for him a few months back so he discontinued this). As to continuing Amatine use, I'm not convinced this is making a big difference in his constipation. So far he has just been using samples from the office and inpatient administration of this med and has not had to purchase the medication.  I suspect the cost of this medication will be prohibitive to the patient  and it is certainly not the magic bullet he had hoped for, so this medication is optional at this point.    [name]  04/20/2014, 8:10 AM Pager: [phone]  ________________________________________________________________________  Curtis [name] GI MD note:  I personally examined the patient, reviewed the data and agree with the assessment and plan described above.  He's feeling better. Does not need further inpatient workup. Should follow up with [name] as needed in GI.   Owens Loffler, MD [facility] Gastroenterology Pager [phone]

## 2014-04-20 NOTE — Discharge Instructions (Signed)
Heart Healthy diet small meals may be better than large meals  Continue Protonix, this would be safest med to take with plavix.  We stopped the Brilinta and changed to Plavix.  Call if further problems at home.   See Dr, Olevia Perches if further problems.

## 2014-04-20 NOTE — Progress Notes (Signed)
Subjective: "I ate all my breakfast without any problems"  Explained his CT scan-nothing acute and AAA stable and small.  Objective: Vital signs in last 24 hours: Temp:  [97.6 F (36.4 C)-98.9 F (37.2 C)] 97.6 F (36.4 C) (12/17 0536) Pulse Rate:  [68-92] 68 (12/17 0536) Resp:  [18] 18 (12/17 0536) BP: (124-151)/(57-77) 124/57 mmHg (12/17 0536) SpO2:  [97 %-98 %] 98 % (12/17 0536) Weight change:  Last BM Date: 04/19/14 Intake/Output from previous day: +240 12/16 0701 - 12/17 0700 In: 240 [P.O.:240] Out: -  Intake/Output this shift:    PE: General:Pleasant affect, NAD Skin:Warm and dry, brisk capillary refill HEENT:normocephalic, sclera clear, mucus membranes moist Neck:supple, no JVD, no bruits  Heart:S1S2 RRR with ? systolic murmur not noted yesterday and HR increased on exam, no gallup, rub or click Lungs:clear without rales, rhonchi, or wheezes GUY:QIHK, non tender, + BS, do not palpate liver spleen or masses Ext:no lower ext edema, 2+ pedal pulses, 2+ radial pulses Neuro:alert and oriented, MAE, follows commands, + facial symmetry Tele:SR though with activity up to 124. ST   Lab Results:  Recent Labs  04/17/14 1422  WBC 7.1  HGB 10.8*  HCT 32.8*  PLT 255   BMET  Recent Labs  04/17/14 1422 04/18/14 1600  NA 141 140  K 4.9 4.2  CL 103 100  CO2 24 24  GLUCOSE 105* 123*  BUN 21 18  CREATININE 1.13 1.21  CALCIUM 10.1 9.8    Recent Labs  04/18/14 0306 04/18/14 0857  TROPONINI <0.30 <0.30    Lab Results  Component Value Date   CHOL 102 04/18/2014   HDL 24* 04/18/2014   LDLCALC 34 04/18/2014   LDLDIRECT 43.7 04/03/2014   TRIG 222* 04/18/2014   CHOLHDL 4.3 04/18/2014   Lab Results  Component Value Date   HGBA1C 6.2* 09/07/2013     Lab Results  Component Value Date   TSH 2.380 01/16/2014    Hepatic Function Panel  Recent Labs  04/18/14 1600  PROT 8.0  ALBUMIN 3.9  AST 19  ALT 17  ALKPHOS 105  BILITOT 0.5  BILIDIR  <0.2  IBILI NOT CALCULATED    Recent Labs  04/18/14 0306  CHOL 102   No results for input(s): PROTIME in the last 72 hours.     Studies/Results: Ct Abdomen Pelvis W Contrast  04/19/2014   CLINICAL DATA:  Abdominal pain, status post Nissen fundoplication, history of GERD  EXAM: CT ABDOMEN AND PELVIS WITH CONTRAST  TECHNIQUE: Multidetector CT imaging of the abdomen and pelvis was performed using the standard protocol following bolus administration of intravenous contrast.  CONTRAST:  124mL OMNIPAQUE IOHEXOL 300 MG/ML  SOLN  COMPARISON:  12/22/2009  FINDINGS: Lower chest:  Calcified pleural plaques at the lung bases.  Hepatobiliary: Liver is within normal limits.  Gallbladder is unremarkable. No intrahepatic or extrahepatic ductal dilatation.  Pancreas: Within normal limits.  Spleen: Within normal limits.  Adrenals/Urinary Tract: Adrenal glands are unremarkable.  Bilateral renal cortical atrophy. 11 mm right lower pole renal cyst (series 7/image 21). 7 mm probable left lower pole renal cyst (series 7/ image 21). No hydronephrosis.  Bladder is unremarkable.  Stomach/Bowel: Stomach is notable for postsurgical changes related to Nissen fundoplication (series 2/image 16). Additional wall thickening/edema in the region of the gastric antrum (series 2/image 27), nonspecific, although persistent on delayed imaging. Correlate for gastritis.  No evidence of bowel obstruction.  Appendix is not discretely visualized.  Sigmoid  diverticulosis, without evidence of diverticulitis.  Vascular/Lymphatic: Atherosclerotic calcifications of the abdominal aorta and branch vessels. Ectasia of the infrarenal abdominal aorta, measuring up to 2.8 x 2.9 cm.  No suspicious abdominopelvic lymphadenopathy.  Reproductive: Prostate is unremarkable.  Other: No abdominopelvic ascites.  Musculoskeletal: Mild degenerative changes of the visualized thoracolumbar spine.  IMPRESSION: No evidence of bowel obstruction.  Wall thickening/edema  in the region of the gastric antrum, nonspecific. Correlate for gastritis.  Additional stable ancillary findings as above.   Electronically Signed   By: Julian Hy M.D.   On: 04/19/2014 20:06   Nm Myocar Multi W/spect W/wall Motion / Ef  04/18/2014   CLINICAL DATA:  Shortness of breath. History of peripheral vascular disease, pulmonary asbestosis, hypertension and GERD.  EXAM: MYOCARDIAL IMAGING WITH SPECT (REST AND PHARMACOLOGIC-STRESS)  GATED LEFT VENTRICULAR WALL MOTION STUDY  LEFT VENTRICULAR EJECTION FRACTION  TECHNIQUE: Standard myocardial SPECT imaging was performed after resting intravenous injection of 10 mCi Tc-80m sestamibi. Subsequently, intravenous infusion of Lexiscan was performed under the supervision of the Cardiology staff. At peak effect of the drug, 30 mCi Tc-17m sestamibi was injected intravenously and standard myocardial SPECT imaging was performed. Quantitative gated imaging was also performed to evaluate left ventricular wall motion, and estimate left ventricular ejection fraction.  COMPARISON:  Chest radiograph - 04/17/2014  FINDINGS: Raw images: There is no significant patient motion artifact on either the provided rest or stress images. There is mild GI attenuation seen on both the provided rest and stress images.  Perfusion: There is mild matched attenuation involving the inferior wall of the left ventricle on both the provided rest and stress images which is without associated regional wall motion abnormality. There is otherwise relative homogeneous distribution of injected radiotracer without definitive matched or mismatched filling defect to suggest prior infarction or pharmacologically induced ischemia.  Wall Motion: Normal left ventricular wall motion. No left ventricular dilation.  Left Ventricular Ejection Fraction: 67 %  End diastolic volume 54 ml  End systolic volume 18 ml  IMPRESSION: 1. No scintigraphic evidence of prior infarction or pharmacologically induced  ischemia.  2. Normal left ventricular wall motion.  3. Left ventricular ejection fraction 67%  4. Low-risk stress test findings*.  *2012 Appropriate Use Criteria for Coronary Revascularization Focused Update: J Am Coll Cardiol. 1660;63(0):160-109. http://content.airportbarriers.com.aspx?articleid=1201161   Electronically Signed   By: Sandi Mariscal M.D.   On: 04/18/2014 16:49    Medications: I have reviewed the patient's current medications. Scheduled Meds: . alprazolam  2 mg Oral BID  . amitriptyline  75 mg Oral QHS  . amLODipine  2.5 mg Oral Daily  . aspirin  81 mg Oral Daily  . atorvastatin  40 mg Oral q1800  . clopidogrel  75 mg Oral Daily  . enoxaparin (LOVENOX) injection  40 mg Subcutaneous Q24H  . finasteride  5 mg Oral Daily  . gabapentin  300 mg Oral TID  . lubiprostone  24 mcg Oral BID WC  . metoCLOPramide (REGLAN) injection  10 mg Intravenous 4 times per day  . metoprolol  25 mg Oral BID  . pantoprazole  40 mg Oral Daily  . tamsulosin  0.4 mg Oral QHS   Continuous Infusions:  PRN Meds:.acetaminophen, alum & mag hydroxide-simeth, nitroGLYCERIN, ondansetron (ZOFRAN) IV    Assessment/Plan: 1. SOB/Weakness/Fatigue - unclear etiology - Brilinta changed to plavix due to cost issue, will see if sx improve off brilinta - decrease Lipitor dose to 40mg  daily. Check LFT - neg Lexiscan stress test  Cardiac symptoms have improved  2. SVT - s/p catheter ablation 01/16/2014--maintaining SR with wandering P wave  3. CAD  - s/p cath 01/17/2014 severe 2 vessel CAD with 100% occluded RCA, 80-90% lesion in prox and mid LCx treated with Promus Premier DES 3.89mmx38mm postdilated to 3.53mm, residual ostial 70% stenosis in small marginal jailed by stent - continue ASA and plavix  - neg nuc this admit   4. COPD  5. HTN: on metoprolol and amlodipine, BP elevated yesterday with abd  pain- stable today  6. abd pain- the worst pain he has had, last saw [name] 3 years ago -he does have GERD, had a Nissen fundoplication in 3710. Last colonoscopy 3.12 with mild diverticulosis of lt colon. His last upper endoscopy in 2006 showed a functioning Nissen fundoplication and bile reflux. He was dilated with a 54 [place] Maloney dilator. - GI consult With [facility] he is followed by [name]-  GI saw yesterday, CT abd without acute process.  -he is on amitiza for chronic constipation but no PPI - with hx of GERD and IBS added protonix- GI also added reglan. May need esophagram   7. AAA on CT stable.      LOS: 3 days   Time spent with pt. : >15 minutes. [facility]  Nurse Practitioner Certified Pager [phone] or after 5pm and on weekends call [phone] 04/20/2014, 8:03 AM   I have seen, examined and evaluated the patient this AM along with Cecilie Kicks, NP.  After reviewing all the available data and chart,  I agree with her findings, examination as well as impression recommendations.   Stable from a cardiac w/u with negative Myoview.   Converted to Plavix -- for PPI would agree with Pantoprazole to avoid interaction.  CT ABd reviewed - appreciate GI input. If no further inpatient evaluation recommended - would anticipate d/c this PM once he has shown ambulatory stability.  Needs GI f/u as OP.   Leonie Man, M.D., M.S. Interventional Cardiologist   Pager # [phone]

## 2014-04-20 NOTE — Progress Notes (Signed)
UR completed 

## 2014-04-21 ENCOUNTER — Telehealth: Payer: Self-pay | Admitting: Cardiology

## 2014-04-21 ENCOUNTER — Ambulatory Visit: Payer: Medicare Other | Admitting: Family

## 2014-04-21 NOTE — Telephone Encounter (Signed)
Curtis [name] is calling because his condition has changed more since this morning up until now . Stated that his son called in earlier this morning . Just got home from the hospital last night and this morning there is a bruise about 7inches wide .[name] Got to looking over his body and started to see little red bumps coming up and they have been multiplying as the day goes on. Please call .[name]   Thanks

## 2014-04-21 NOTE — Telephone Encounter (Signed)
Reviewed chart. I don't this patient is mine. I've never seen him. Looks like Dr Lovena Le and Dr Aundra Dubin are his cardiologists. thx  Sherren Mocha 04/21/2014 9:02 AM

## 2014-04-21 NOTE — Telephone Encounter (Signed)
I did his initial consult in hospital a while back, never saw him afterwards.  Can see in followup if needed if Ronalee Belts has never seen him.

## 2014-04-21 NOTE — Telephone Encounter (Signed)
Spoke w/pt who states that his son spoke with a nurse this AM to advise of bruising on his lower abdomen.  Now when I spoke w/him he states he has rash on his torso, leg and head.  States seems to be spreading. Spoke w/Curtis Dunn,PA who suggests  that he be seen by Urgent Care today or tomorrow.  Curtis [name] gave his permission to speak with his son. States he is his POA.  Advised pt that he needs to go to urgent care but he doesn't seem to understand. Lm for his son to take him to urgent care today or tomorrow.  Will send request to [facility] to schedule him post hospital visit with Dr. Burt Knack per discharge summary.

## 2014-04-21 NOTE — Telephone Encounter (Signed)
Spoke to son Elta Guadeloupe) Elta Guadeloupe states patient has been released from hospital this week. Patient was started on Plavix. DISCONTINUED  BRILINTA in the hospital.  Elta Guadeloupe noticed yesterday,his father has more bruises and was concerned. Elta Guadeloupe states patient has about 7 inch wide area on his stomach. No bleeding noted ,no nose bleed or blood in his urine. Elta Guadeloupe states patient did not have any bruising while on Brilinta.  RN informed son to continue to monitor ,the bruising may have come from the transport from hospital to home.[name]

## 2014-04-21 NOTE — Telephone Encounter (Signed)
They changeg hid medicine while he was in the hospital this wee, They changed him from [place] to [place] he has a lot bruising.Please call to advise.

## 2014-04-21 NOTE — Telephone Encounter (Signed)
He has bruises from Sub Q Heparin whilein patient.   Is less likely to bruise on Plavix vs. Brilinta.   DH HARDING, Leonie Green, MD

## 2014-04-24 ENCOUNTER — Encounter (HOSPITAL_COMMUNITY): Payer: Self-pay

## 2014-04-24 ENCOUNTER — Emergency Department (HOSPITAL_COMMUNITY): Payer: Medicare Other

## 2014-04-24 ENCOUNTER — Inpatient Hospital Stay (HOSPITAL_COMMUNITY)
Admission: EM | Admit: 2014-04-24 | Discharge: 2014-05-02 | DRG: 193 | Disposition: A | Discharge status: Skilled Nursing Facility | Payer: Medicare Other | Attending: Internal Medicine | Admitting: Internal Medicine

## 2014-04-24 DIAGNOSIS — Z6827 Body mass index (BMI) 27.0-27.9, adult: Secondary | ICD-10-CM | POA: Diagnosis not present

## 2014-04-24 DIAGNOSIS — J9611 Chronic respiratory failure with hypoxia: Secondary | ICD-10-CM | POA: Diagnosis present

## 2014-04-24 DIAGNOSIS — R079 Chest pain, unspecified: Secondary | ICD-10-CM

## 2014-04-24 DIAGNOSIS — Y95 Nosocomial condition: Secondary | ICD-10-CM | POA: Diagnosis present

## 2014-04-24 DIAGNOSIS — I252 Old myocardial infarction: Secondary | ICD-10-CM

## 2014-04-24 DIAGNOSIS — Z87891 Personal history of nicotine dependence: Secondary | ICD-10-CM

## 2014-04-24 DIAGNOSIS — J449 Chronic obstructive pulmonary disease, unspecified: Secondary | ICD-10-CM | POA: Diagnosis present

## 2014-04-24 DIAGNOSIS — E785 Hyperlipidemia, unspecified: Secondary | ICD-10-CM | POA: Diagnosis present

## 2014-04-24 DIAGNOSIS — J9621 Acute and chronic respiratory failure with hypoxia: Secondary | ICD-10-CM | POA: Diagnosis not present

## 2014-04-24 DIAGNOSIS — I10 Essential (primary) hypertension: Secondary | ICD-10-CM | POA: Diagnosis present

## 2014-04-24 DIAGNOSIS — R1310 Dysphagia, unspecified: Secondary | ICD-10-CM | POA: Diagnosis present

## 2014-04-24 DIAGNOSIS — J189 Pneumonia, unspecified organism: Secondary | ICD-10-CM | POA: Diagnosis not present

## 2014-04-24 DIAGNOSIS — Z66 Do not resuscitate: Secondary | ICD-10-CM | POA: Diagnosis present

## 2014-04-24 DIAGNOSIS — D638 Anemia in other chronic diseases classified elsewhere: Secondary | ICD-10-CM | POA: Diagnosis present

## 2014-04-24 DIAGNOSIS — Z800 Family history of malignant neoplasm of digestive organs: Secondary | ICD-10-CM | POA: Diagnosis not present

## 2014-04-24 DIAGNOSIS — K219 Gastro-esophageal reflux disease without esophagitis: Secondary | ICD-10-CM | POA: Diagnosis present

## 2014-04-24 DIAGNOSIS — E871 Hypo-osmolality and hyponatremia: Secondary | ICD-10-CM | POA: Diagnosis present

## 2014-04-24 DIAGNOSIS — E43 Unspecified severe protein-calorie malnutrition: Secondary | ICD-10-CM | POA: Diagnosis present

## 2014-04-24 DIAGNOSIS — T17908A Unspecified foreign body in respiratory tract, part unspecified causing other injury, initial encounter: Secondary | ICD-10-CM | POA: Insufficient documentation

## 2014-04-24 DIAGNOSIS — F329 Major depressive disorder, single episode, unspecified: Secondary | ICD-10-CM | POA: Diagnosis present

## 2014-04-24 DIAGNOSIS — J61 Pneumoconiosis due to asbestos and other mineral fibers: Secondary | ICD-10-CM | POA: Diagnosis present

## 2014-04-24 DIAGNOSIS — Z7982 Long term (current) use of aspirin: Secondary | ICD-10-CM

## 2014-04-24 DIAGNOSIS — D509 Iron deficiency anemia, unspecified: Secondary | ICD-10-CM | POA: Diagnosis present

## 2014-04-24 DIAGNOSIS — I25118 Atherosclerotic heart disease of native coronary artery with other forms of angina pectoris: Secondary | ICD-10-CM

## 2014-04-24 DIAGNOSIS — R0602 Shortness of breath: Secondary | ICD-10-CM | POA: Insufficient documentation

## 2014-04-24 DIAGNOSIS — J690 Pneumonitis due to inhalation of food and vomit: Secondary | ICD-10-CM | POA: Diagnosis not present

## 2014-04-24 DIAGNOSIS — Z955 Presence of coronary angioplasty implant and graft: Secondary | ICD-10-CM

## 2014-04-24 DIAGNOSIS — I2510 Atherosclerotic heart disease of native coronary artery without angina pectoris: Secondary | ICD-10-CM | POA: Diagnosis present

## 2014-04-24 DIAGNOSIS — R591 Generalized enlarged lymph nodes: Secondary | ICD-10-CM | POA: Diagnosis present

## 2014-04-24 DIAGNOSIS — Z79899 Other long term (current) drug therapy: Secondary | ICD-10-CM

## 2014-04-24 DIAGNOSIS — R509 Fever, unspecified: Secondary | ICD-10-CM

## 2014-04-24 LAB — CBC WITH DIFFERENTIAL/PLATELET
BASOS PCT: 1 % (ref 0–1)
Basophils Absolute: 0 10*3/uL (ref 0.0–0.1)
Eosinophils Absolute: 0.2 10*3/uL (ref 0.0–0.7)
Eosinophils Relative: 4 % (ref 0–5)
HEMATOCRIT: 32.3 % — AB (ref 39.0–52.0)
HEMOGLOBIN: 10.1 g/dL — AB (ref 13.0–17.0)
LYMPHS PCT: 19 % (ref 12–46)
Lymphs Abs: 0.7 10*3/uL (ref 0.7–4.0)
MCH: 28.3 pg (ref 26.0–34.0)
MCHC: 31.3 g/dL (ref 30.0–36.0)
MCV: 90.5 fL (ref 78.0–100.0)
MONO ABS: 0.6 10*3/uL (ref 0.1–1.0)
Monocytes Relative: 16 % — ABNORMAL HIGH (ref 3–12)
NEUTROS ABS: 2.4 10*3/uL (ref 1.7–7.7)
NEUTROS PCT: 60 % (ref 43–77)
Platelets: 284 10*3/uL (ref 150–400)
RBC: 3.57 MIL/uL — ABNORMAL LOW (ref 4.22–5.81)
RDW: 15.2 % (ref 11.5–15.5)
WBC: 3.9 10*3/uL — AB (ref 4.0–10.5)

## 2014-04-24 LAB — COMPREHENSIVE METABOLIC PANEL
ALK PHOS: 105 U/L (ref 39–117)
ALT: 18 U/L (ref 0–53)
ANION GAP: 14 (ref 5–15)
AST: 19 U/L (ref 0–37)
Albumin: 3.8 g/dL (ref 3.5–5.2)
BUN: 13 mg/dL (ref 6–23)
CALCIUM: 9.6 mg/dL (ref 8.4–10.5)
CO2: 24 mEq/L (ref 19–32)
CREATININE: 1.27 mg/dL (ref 0.50–1.35)
Chloride: 98 mEq/L (ref 96–112)
GFR calc non Af Amer: 56 mL/min — ABNORMAL LOW (ref >90)
GFR, EST AFRICAN AMERICAN: 65 mL/min — AB (ref >90)
GLUCOSE: 107 mg/dL — AB (ref 70–99)
POTASSIUM: 3.9 meq/L (ref 3.7–5.3)
Sodium: 136 mEq/L — ABNORMAL LOW (ref 137–147)
TOTAL PROTEIN: 7.6 g/dL (ref 6.0–8.3)
Total Bilirubin: 0.3 mg/dL (ref 0.3–1.2)

## 2014-04-24 LAB — PRO B NATRIURETIC PEPTIDE: PRO B NATRI PEPTIDE: 330.3 pg/mL — AB (ref 0–125)

## 2014-04-24 LAB — TROPONIN I

## 2014-04-24 LAB — PROTIME-INR
INR: 1.02 (ref 0.00–1.49)
Prothrombin Time: 13.5 seconds (ref 11.6–15.2)

## 2014-04-24 MED ORDER — AMITRIPTYLINE HCL 75 MG PO TABS
75.0000 mg | ORAL_TABLET | Freq: Every day | ORAL | Status: DC
  Administered 2014-04-25 – 2014-04-27 (×4): 75 mg via ORAL
  Filled 2014-04-24 (×6): qty 1

## 2014-04-24 MED ORDER — IOHEXOL 350 MG/ML SOLN
100.0000 mL | Freq: Once | INTRAVENOUS | Status: AC | PRN
  Administered 2014-04-24: 100 mL via INTRAVENOUS

## 2014-04-24 MED ORDER — ALPRAZOLAM 1 MG PO TABS
2.0000 mg | ORAL_TABLET | Freq: Two times a day (BID) | ORAL | Status: DC
  Administered 2014-04-25 – 2014-05-02 (×15): 2 mg via ORAL
  Filled 2014-04-24 (×3): qty 2
  Filled 2014-04-24: qty 4
  Filled 2014-04-24 (×11): qty 2

## 2014-04-24 MED ORDER — PANTOPRAZOLE SODIUM 40 MG PO TBEC
80.0000 mg | DELAYED_RELEASE_TABLET | Freq: Every day | ORAL | Status: DC
  Administered 2014-04-25 – 2014-04-28 (×4): 80 mg via ORAL
  Filled 2014-04-24 (×5): qty 2

## 2014-04-24 MED ORDER — ASPIRIN 81 MG PO CHEW
81.0000 mg | CHEWABLE_TABLET | Freq: Every day | ORAL | Status: DC
  Administered 2014-04-25 – 2014-05-02 (×8): 81 mg via ORAL
  Filled 2014-04-24 (×9): qty 1

## 2014-04-24 MED ORDER — FINASTERIDE 5 MG PO TABS
5.0000 mg | ORAL_TABLET | Freq: Every day | ORAL | Status: DC
  Administered 2014-04-25 – 2014-05-02 (×8): 5 mg via ORAL
  Filled 2014-04-24 (×8): qty 1

## 2014-04-24 MED ORDER — HEPARIN SODIUM (PORCINE) 5000 UNIT/ML IJ SOLN
5000.0000 [IU] | Freq: Three times a day (TID) | INTRAMUSCULAR | Status: DC
  Administered 2014-04-25 – 2014-05-01 (×21): 5000 [IU] via SUBCUTANEOUS
  Filled 2014-04-24 (×24): qty 1

## 2014-04-24 MED ORDER — NITROGLYCERIN 0.4 MG SL SUBL: 0.4000 mg | SUBLINGUAL_TABLET | SUBLINGUAL | Status: DC | PRN

## 2014-04-24 MED ORDER — DEXTROSE 5 % IV SOLN
1.0000 g | Freq: Three times a day (TID) | INTRAVENOUS | Status: DC
  Administered 2014-04-25 – 2014-04-28 (×10): 1 g via INTRAVENOUS
  Filled 2014-04-24 (×11): qty 1

## 2014-04-24 MED ORDER — GABAPENTIN 300 MG PO CAPS
300.0000 mg | ORAL_CAPSULE | Freq: Three times a day (TID) | ORAL | Status: DC
  Administered 2014-04-25 – 2014-04-28 (×12): 300 mg via ORAL
  Filled 2014-04-24 (×16): qty 1

## 2014-04-24 MED ORDER — METOPROLOL TARTRATE 25 MG PO TABS
25.0000 mg | ORAL_TABLET | Freq: Two times a day (BID) | ORAL | Status: DC
  Administered 2014-04-25 – 2014-05-02 (×15): 25 mg via ORAL
  Filled 2014-04-24 (×17): qty 1

## 2014-04-24 MED ORDER — CLOPIDOGREL BISULFATE 75 MG PO TABS
75.0000 mg | ORAL_TABLET | Freq: Every day | ORAL | Status: DC
  Administered 2014-04-25 – 2014-05-02 (×8): 75 mg via ORAL
  Filled 2014-04-24 (×10): qty 1

## 2014-04-24 MED ORDER — AMLODIPINE BESYLATE 2.5 MG PO TABS
2.5000 mg | ORAL_TABLET | Freq: Every evening | ORAL | Status: DC
  Administered 2014-04-25 – 2014-04-26 (×2): 2.5 mg via ORAL
  Filled 2014-04-24 (×3): qty 1

## 2014-04-24 MED ORDER — ATORVASTATIN CALCIUM 40 MG PO TABS
40.0000 mg | ORAL_TABLET | Freq: Every day | ORAL | Status: DC
  Administered 2014-04-25 – 2014-04-28 (×4): 40 mg via ORAL
  Filled 2014-04-24 (×5): qty 1

## 2014-04-24 MED ORDER — PIPERACILLIN-TAZOBACTAM 3.375 G IVPB 30 MIN
3.3750 g | Freq: Once | INTRAVENOUS | Status: AC
  Administered 2014-04-24: 3.375 g via INTRAVENOUS
  Filled 2014-04-24: qty 50

## 2014-04-24 MED ORDER — TAMSULOSIN HCL 0.4 MG PO CAPS
0.4000 mg | ORAL_CAPSULE | Freq: Every day | ORAL | Status: DC
  Administered 2014-04-25 – 2014-05-01 (×7): 0.4 mg via ORAL
  Filled 2014-04-24 (×9): qty 1

## 2014-04-24 MED ORDER — LUBIPROSTONE 24 MCG PO CAPS
24.0000 ug | ORAL_CAPSULE | Freq: Two times a day (BID) | ORAL | Status: DC
  Administered 2014-04-25 – 2014-05-02 (×14): 24 ug via ORAL
  Filled 2014-04-24 (×18): qty 1

## 2014-04-24 MED ORDER — NITROGLYCERIN 0.4 MG SL SUBL
0.4000 mg | SUBLINGUAL_TABLET | SUBLINGUAL | Status: DC | PRN
  Administered 2014-04-24 (×2): 0.4 mg via SUBLINGUAL
  Filled 2014-04-24: qty 1

## 2014-04-24 MED ORDER — VANCOMYCIN HCL IN DEXTROSE 1-5 GM/200ML-% IV SOLN
1000.0000 mg | Freq: Once | INTRAVENOUS | Status: AC
  Administered 2014-04-24: 1000 mg via INTRAVENOUS
  Filled 2014-04-24: qty 200

## 2014-04-24 NOTE — ED Notes (Signed)
Patient's son visiting the patient. Patient reported to son that no one would give him a bag to spit in. Patient then said he had to swallow his spit. Patient had 2 bags lying on his bed. Patient's son told patient to spit in the floor.

## 2014-04-24 NOTE — ED Notes (Signed)
Bed: WA11 Expected date:  Expected time:  Means of arrival:  Comments: EMS-SOB 

## 2014-04-24 NOTE — ED Provider Notes (Signed)
CSN: [id number]     Arrival date & time 04/24/14  1711 History   First MD Initiated Contact with Patient 04/24/14 1719     Chief Complaint  Patient presents with  . Shortness of Breath     (Consider location/radiation/quality/duration/timing/severity/associated sxs/prior Treatment) Patient is a 68 y.o. male presenting with shortness of breath. The history is provided by the patient.  Shortness of Breath Severity:  Moderate Onset quality:  Gradual Timing:  Constant Progression:  Worsening Chronicity:  Recurrent Context: not URI   Context comment:  Recently in the hospital for similar Relieved by:  Nothing Worsened by:  Nothing tried Associated symptoms: chest pain and cough   Associated symptoms: no fever     Past Medical History  Diagnosis Date  . Arthritis   . SOB (shortness of breath)   . Pulmonary asbestosis   . Hernia of unspecified site of abdominal cavity without mention of obstruction or gangrene     hiatal  . GERD (gastroesophageal reflux disease)   . Depression   . Diverticulitis     hospital 2011 [facility]  . Diverticulosis   . Reflex sympathetic dystrophy   . 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 Hypertension   . IBS (irritable bowel syndrome)   . Peripheral vascular disease     ankle brachial index of 0.79 on the right and 0.61 on the left.   . Myocardial infarction 01/2014    NSTEMI  . Hyperlipidemia LDL goal <70 04/20/2014  . AAA (abdominal aortic aneurysm), stable 04/20/2014   Past Surgical History  Procedure Laterality Date  . Nissen fundoplication    . Right elbow surgery      x 2  . Right knee arthroscopy    . Left shoulder       x 3  . Squamous cell skin cancer      Left Hand  . Cataract extraction    . Supraventricular tachycardia ablation N/A 01/16/2014    Procedure: SUPRAVENTRICULAR TACHYCARDIA ABLATION;  Surgeon: Evans Lance, MD;  Location: [facility] CATH LAB;  Service: Cardiovascular;  Laterality: N/A;  . Left heart catheterization with coronary angiogram N/A  01/17/2014    Procedure: LEFT HEART CATHETERIZATION WITH CORONARY ANGIOGRAM;  Surgeon: Leonie Man, MD;  Location: [facility] CATH LAB;  Service: Cardiovascular;  Laterality: N/A;   Family History  Problem Relation Age of Onset  . Colon cancer Mother   . Colon cancer Maternal Aunt   . Colon cancer Maternal Uncle   . Heart attack Neg Hx   . Stroke Neg Hx    History  Substance Use Topics  . Smoking status: Former Smoker -- 2.50 packs/day for 50 years    Types: Cigarettes    Quit date: 05/05/2005  . Smokeless tobacco: Former Systems developer    Quit date: 05/03/2012  . Alcohol Use: No     Comment: last use 2013    Review of Systems  Constitutional: Negative for fever and chills.  Respiratory: Positive for cough and shortness of breath.   Cardiovascular: Positive for chest pain.  All other systems reviewed and are negative.     Allergies  Codeine  Home Medications   Prior to Admission medications   Medication Sig Start Date End Date Taking? Authorizing Provider  alprazolam [name]) 2 MG tablet Take 2 mg by mouth 2 (two) times daily.    Historical Provider, MD  amitriptyline (ELAVIL) 75 MG tablet Take 75 mg by mouth at bedtime.    Historical Provider, MD  amLODipine (NORVASC) 2.5 MG  tablet Take 1 tablet (2.5 mg total) by mouth daily. 02/01/14   [name], PA-C  aspirin 81 MG chewable tablet Chew 1 tablet (81 mg total) by mouth daily. 01/20/14   [name], NP  atorvastatin (LIPITOR) 40 MG tablet Take 1 tablet (40 mg total) by mouth daily at 6 PM. 04/20/14   [name], NP  clopidogrel (PLAVIX) 75 MG tablet Take 1 tablet (75 mg total) by mouth daily. 04/20/14   [name], NP  finasteride (PROSCAR) 5 MG tablet Take 5 mg by mouth every morning.     Historical Provider, MD  gabapentin (NEURONTIN) 300 MG capsule Take 300 mg by mouth 3 (three) times daily.     Historical Provider, MD  lubiprostone (AMITIZA) 24 MCG capsule Take 24 mcg by mouth 2 (two) times daily with a meal.     Historical Provider, MD  metoprolol (LOPRESSOR) 50 MG tablet Take 25 mg by mouth 2 (two) times daily.    Historical Provider, MD  nitroGLYCERIN (NITROSTAT) 0.4 MG SL tablet Place 1 tablet (0.4 mg total) under the tongue every 5 (five) minutes x 3 doses as needed for chest pain. 04/20/14   [name], NP  pantoprazole (PROTONIX) 40 MG tablet Take 1 tablet (40 mg total) by mouth daily. 04/20/14   [name], NP  tamsulosin (FLOMAX) 0.4 MG CAPS capsule Take 1 capsule (0.4 mg total) by mouth daily. 09/08/13   Ripudeep K Rai, MD   BP 161/66 mmHg  Pulse 84  Temp(Src) 98 F (36.7 C) (Oral)  Resp 18  SpO2 94% Physical Exam  Constitutional: He is oriented to person, place, and time. He appears well-developed and well-nourished. No distress.  HENT:  Head: Normocephalic and atraumatic.  Mouth/Throat: No oropharyngeal exudate.  Eyes: EOM are normal. Pupils are equal, round, and reactive to light.  Neck: Normal range of motion. Neck supple.  Cardiovascular: Normal rate and regular rhythm.  Exam reveals no friction rub.   No murmur heard. Pulmonary/Chest: Breath sounds normal. He is in respiratory distress (mild dyspnea). He has no wheezes. He has no rales.  Abdominal: He exhibits no distension. There is no tenderness. There is no rebound.  Musculoskeletal: Normal range of motion. He exhibits no edema.  Neurological: He is alert and oriented to person, place, and time.  Skin: No rash noted. He is not diaphoretic.  Nursing note and vitals reviewed.   ED Course  Procedures (including critical care time) Labs Review Labs Reviewed  COMPREHENSIVE METABOLIC PANEL  CBC WITH DIFFERENTIAL  PROTIME-INR  TROPONIN I  PRO B NATRIURETIC PEPTIDE    Imaging Review Dg Chest 2 View  04/24/2014   CLINICAL DATA:  Shortness of breath for 12 hr  EXAM: CHEST  2 VIEW  COMPARISON:  04/17/2014  FINDINGS: Calcified pleural plaques reidentified. New trace pleural effusions are identified. Moderate  cardiomegaly noted. Biapical scarring is stable. Probable underlying emphysema again noted. A few interstitial Kerley B lines are noted peripherally. Bones are subjectively osteopenic. Mild mid thoracic kyphosis.  IMPRESSION: New trace pleural effusions with a few Kerley B-lines likely indicating developing interstitial edema.   Electronically Signed   By: Conchita Paris M.D.   On: 04/24/2014 19:25   Ct Angio Chest Pe W/cm &/or Wo Cm  04/24/2014   CLINICAL DATA:  Acute onset of shortness of breath for 12 hours. Initial encounter.  EXAM: CT ANGIOGRAPHY CHEST WITH CONTRAST  TECHNIQUE: Multidetector CT imaging of the chest was performed using the  standard protocol during bolus administration of intravenous contrast. Multiplanar CT image reconstructions and MIPs were obtained to evaluate the vascular anatomy.  CONTRAST:  160mL OMNIPAQUE IOHEXOL 350 MG/ML SOLN  COMPARISON:  Chest radiograph performed earlier today at 6:58 p.m., and CT of the chest performed 05/27/2012  FINDINGS: There is no evidence of pulmonary embolus.  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 Scattered associated calcification is seen. No definite new masses are identified.  There are also mild patchy and nodular airspace opacities within the upper lobes bilaterally, which could reflect a mild infectious process. There is no evidence of pleural effusion or pneumothorax.  Diffuse mediastinal lymphadenopathy is more prominent than on the prior study, with a azygoesophageal recess node measuring 1.9 cm in short axis. Mildly enlarged bilateral hilar nodes are also increased in prominence, measuring up to 1.3 cm in short axis. Diffuse coronary artery calcifications are seen.  No pericardial effusion is identified. Scattered calcific atherosclerotic disease is noted along the aortic arch and proximal great vessels. No axillary lymphadenopathy  is seen. The thyroid gland is unremarkable in appearance.  The visualized portions of the liver and spleen are unremarkable.  No acute osseous abnormalities are seen.  Review of the MIP images confirms the above findings.  IMPRESSION: 1. No evidence of pulmonary embolus. 2. Mild patchy and nodular airspace opacities within the upper lobes bilaterally, concerning for an acute infectious process. 3. Diffuse asbestos-related pleural disease, with subpleural consolidation and plaques, and scattered calcification, similar in appearance to 2014. No new masses seen. 4. Increased prominence of mediastinal and bilateral hilar lymphadenopathy. This could reflect a systemic conditions such as sarcoidosis; lymphoma or metastatic disease cannot be excluded. Several of these nodes would be amenable to biopsy. 5. Diffuse coronary artery calcifications seen.   Electronically Signed   By: Garald Balding M.D.   On: 04/24/2014 22:29     EKG Interpretation   Date/Time:  Monday April 24 2014 17:21:04 EST Ventricular Rate:  82 PR Interval:  235 QRS Duration: 93 QT Interval:  379 QTC Calculation: 443 R Axis:   41 Text Interpretation:  Sinus rhythm Prolonged PR interval Probable left  atrial enlargement Abnormal R-wave progression, early transition Similar  to prior Confirmed by Mingo Amber  MD, Latty ([id number]) on 04/24/2014 5:35:41 PM      MDM   Final diagnoses:  Shortness of breath  HCAP (healthcare-associated pneumonia)  Chest pain, unspecified chest pain type    45M here with SOB. Was recently in the hospital for SOB, thought it was secondary to Brilinta, was changed to Plavix. SOB improved throughtout his hospital stay.  He reports it worsened last night with persistent cough, nonproductive. Also having central pressure like chest pain that began last night. Patient is an extremely poor historian.  Hx including CAD with DES, SVT requiring ablation, asbestosis Here coughing, mildly tachypneic. States not  chronically dyspneic due to his asbestosis. Not on inhalers. Lungs clear.  EKG similar to prior CT angio negative for PE, however shows HCAP. Vanc/Zosyn given, admitted.   Evelina Bucy, MD 04/24/14 [phone]

## 2014-04-24 NOTE — ED Notes (Signed)
Per EMS- Patient c/o SOB x 12 hours and getting progressively worse. Patient has a history of COPD and cardiac/stent placement. Patient was given 100 ml NS, EKG unremarkable

## 2014-04-24 NOTE — Telephone Encounter (Signed)
Pt scheduled to see Kerin Ransom 05/02/14.

## 2014-04-24 NOTE — ED Notes (Signed)
Patient states he wants an nerve pill.

## 2014-04-24 NOTE — ED Notes (Addendum)
Awake. Verbally responsive. A/O x4. Resp even and unlabored. Occ dry cough. Auscultated clear breath sounds bil and throughout with sats at 96%-100%RA. ABC's intact. SR on monitor at 82 bpm. Family at bedside.

## 2014-04-24 NOTE — ED Notes (Signed)
Patient called out and stated he was not breathing good. Sats currently 100% on room air. Patient stated that he has "bad panic attacks."

## 2014-04-24 NOTE — Progress Notes (Signed)
CSW spoke with patient at bedside. Son was at bedside, who states he is the patients POA. Pt informed CSW that he lives alone but his son Elta Guadeloupe) lives directly beside him and often comes to check on him. However, he states that his son works 10 hours a day and also has a family of his own making it hard for around the clock care. Patient states that he is here because of SOB. Patient informed CSW that he was at the [facility] last Thursday. Patient also informed CSW that he was at [facility] January 16, 2014 because he had a heart attack.  Patient states that he is only able to sleep on his right side. Patient also says he has issues with his feet. Patient states his feet hurt often and it feels as if he is is walking on nails and that he has 2 blocks of ice for feet. Patient states that he can hardly walk from his bedroom to the bathroom and he feels like he is taking baby steps. Patient says he has been going to the bathroom independently and taking showers but they take him 5x longer than they would usually. Patient says he dries himself off a little at a time. He stated he takes breaks in between drying off because it takes a lot of energy and is also painful.  Patient mentioned to CSW that he has IBS and GERD.   Willette Brace [phone] ED CSW 04/24/2014 9:35 PM

## 2014-04-24 NOTE — H&P (Signed)
[facility] History and Physical  VELMA HANNA ENI:[id number] DOB: Jun 08, 1945 DOA: 04/24/2014  Referring physician: EDP PCP: [name], MD   Chief Complaint: SOB   HPI: Curtis [name] is a 68 y.o. male with CAD, NSTEMI back in Sept, got a stent put in his circumflex at that time, see Dr. Thurman Coyer H+P done last week for cardiac details or my A/P below.  Was admitted to Cardiology service last week due to SOB, work up that time including stress test was reassuring.  Patient presents to the ED with c/o SOB, cough, congestion, cough is productive of yellowish sputum.  Symptoms have been going on since admission last week.  Patient is not a great historian and questions are very hit and miss.  Work up in ED shows findings suspicious for bilateral PNA in BUL on CT scan, not seen on CXR.  Review of Systems: Systems reviewed.  As above, otherwise negative  Past Medical History  Diagnosis Date  . Arthritis   . SOB (shortness of breath)   . Pulmonary asbestosis   . Hernia of unspecified site of abdominal cavity without mention of obstruction or gangrene     hiatal  . GERD (gastroesophageal reflux disease)   . Depression   . Diverticulitis     hospital 2011 [facility]  . Diverticulosis   . Reflex sympathetic dystrophy   . 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 Hypertension   . IBS (irritable bowel syndrome)   . Peripheral vascular disease     ankle brachial index of 0.79 on the right and 0.61 on the left.   . Myocardial infarction 01/2014    NSTEMI  . Hyperlipidemia LDL goal <70 04/20/2014  . AAA (abdominal aortic aneurysm), stable 04/20/2014   Past Surgical History  Procedure Laterality Date  . Nissen fundoplication    . Right elbow surgery      x 2  . Right knee arthroscopy    . Left shoulder       x 3  . Squamous cell skin cancer      Left Hand  . Cataract extraction    . Supraventricular tachycardia ablation N/A 01/16/2014    Procedure: SUPRAVENTRICULAR TACHYCARDIA ABLATION;   Surgeon: Evans Lance, MD;  Location: [facility] CATH LAB;  Service: Cardiovascular;  Laterality: N/A;  . Left heart catheterization with coronary angiogram N/A 01/17/2014    Procedure: LEFT HEART CATHETERIZATION WITH CORONARY ANGIOGRAM;  Surgeon: Leonie Man, MD;  Location[facility] C[facility] LAB;  Service: Cardiovascular;  Laterality: N/A;   Social History:  reports that he quit smoking about 8 years ago. His smoking use included Cigarettes. He has a 125 pack-year smoking history. He quit smokeless tobacco use about 1 years ago. He reports that he does not drink alcohol or use illicit drugs.  Allergies  Allergen Reactions  . Codeine Itching    Family History  Problem Relation Age of Onset  . Colon cancer Mother   . Colon cancer Maternal Aunt   . Colon cancer Maternal Uncle   . Heart attack Neg Hx   . Stroke Neg Hx      Prior to Admission medications   Medication Sig Start Date End Date Taking? Authorizing Provider  alprazolam [name]) 2 MG tablet Take 2 mg by mouth 2 (two) times daily.   Yes Historical Provider, MD  amitriptyline (ELAVIL) 75 MG tablet Take 75 mg by mouth at bedtime.   Yes Historical Provider, MD  amLODipine (NORVASC) 2.5 MG tablet Take 1 tablet (2.5 mg  total) by mouth daily. Patient taking differently: Take 2.5 mg by mouth every evening.  02/01/14  Yes [name], PA-C  aspirin 81 MG chewable tablet Chew 1 tablet (81 mg total) by mouth daily. 01/20/14  Yes [name], NP  atorvastatin (LIPITOR) 40 MG tablet Take 1 tablet (40 mg total) by mouth daily at 6 PM. 04/20/14  Yes [name], NP  clopidogrel (PLAVIX) 75 MG tablet Take 1 tablet (75 mg total) by mouth daily. 04/20/14  Yes [name], NP  finasteride (PROSCAR) 5 MG tablet Take 5 mg by mouth every morning.    Yes Historical Provider, MD  gabapentin (NEURONTIN) 300 MG capsule Take 300 mg by mouth 3 (three) times daily.    Yes Historical Provider, MD  lubiprostone (AMITIZA) 24 MCG capsule Take 24 mcg by mouth 2  (two) times daily with a meal.   Yes Historical Provider, MD  metoprolol (LOPRESSOR) 50 MG tablet Take 25 mg by mouth 2 (two) times daily.   Yes Historical Provider, MD  pantoprazole (PROTONIX) 40 MG tablet Take 1 tablet (40 mg total) by mouth daily. 04/20/14  Yes [name], NP  tamsulosin (FLOMAX) 0.4 MG CAPS capsule Take 1 capsule (0.4 mg total) by mouth daily. Patient taking differently: Take 0.4 mg by mouth at bedtime.  09/08/13  Yes [name], MD  nitroGLYCERIN (NITROSTAT) 0.4 MG SL tablet Place 1 tablet (0.4 mg total) under the tongue every 5 (five) minutes x 3 doses as needed for chest pain. 04/20/14   [name], NP   Physical Exam: Filed Vitals:   04/24/14 2230  BP: 152/69  Pulse: 84  Temp:   Resp: 16    BP 152/69 mmHg  Pulse 84  Temp(Src) 98 F (36.7 C) (Oral)  Resp 16  SpO2 97%  General Appearance:    Alert, oriented, no distress, appears stated age  Head:    Normocephalic, atraumatic  Eyes:    PERRL, EOMI, sclera non-icteric        Nose:   Nares without drainage or epistaxis. Mucosa, turbinates normal  Throat:   Moist mucous membranes. Oropharynx without erythema or exudate.  Neck:   Supple. No carotid bruits.  No thyromegaly.  No lymphadenopathy.   Back:     No CVA tenderness, no spinal tenderness  Lungs:     Coarse breath sounds over mid lung fields.  Chest wall:    No tenderness to palpitation  Heart:    Regular rate and rhythm without murmurs, gallops, rubs  Abdomen:     Soft, non-tender, nondistended, normal bowel sounds, no organomegaly  Genitalia:    deferred  Rectal:    deferred  Extremities:   No clubbing, cyanosis or edema.  Pulses:   2+ and symmetric all extremities  Skin:   Skin color, texture, turgor normal, no rashes or lesions  Lymph nodes:   Cervical, supraclavicular, and axillary nodes normal  Neurologic:   CNII-XII intact. Normal strength, sensation and reflexes      throughout    Labs on Admission:  Basic Metabolic  Panel:  Recent Labs Lab 04/18/14 1600 04/24/14 1819  NA 140 136*  K 4.2 3.9  CL 100 98  CO2 24 24  GLUCOSE 123* 107*  BUN 18 13  CREATININE 1.21 1.27  CALCIUM 9.8 9.6   Liver Function Tests:  Recent Labs Lab 04/18/14 1600 04/24/14 1819  AST 19 19  ALT 17 18  ALKPHOS 105 105  BILITOT 0.5  0.3  PROT 8.0 7.6  ALBUMIN 3.9 3.8   No results for input(s): LIPASE, AMYLASE in the last 168 hours. No results for input(s): AMMONIA in the last 168 hours. CBC:  Recent Labs Lab 04/24/14 1819  WBC 3.9*  NEUTROABS 2.4  HGB 10.1*  HCT 32.3*  MCV 90.5  PLT 284   Cardiac Enzymes:  Recent Labs Lab 04/18/14 0306 04/18/14 0857 04/24/14 1819  TROPONINI <0.30 <0.30 <0.30    BNP (last 3 results)  Recent Labs  01/28/14 1900 04/17/14 1440 04/24/14 1820  PROBNP 336.5* 215.3* 330.3*   CBG: No results for input(s): GLUCAP in the last 168 hours.  Radiological Exams on Admission: Dg Chest 2 View  04/24/2014   CLINICAL DATA:  Shortness of breath for 12 hr  EXAM: CHEST  2 VIEW  COMPARISON:  04/17/2014  FINDINGS: Calcified pleural plaques reidentified. New trace pleural effusions are identified. Moderate cardiomegaly noted. Biapical scarring is stable. Probable underlying emphysema again noted. A few interstitial Kerley B lines are noted peripherally. Bones are subjectively osteopenic. Mild mid thoracic kyphosis.  IMPRESSION: New trace pleural effusions with a few Kerley B-lines likely indicating developing interstitial edema.   Electronically Signed   By: Conchita Paris M.D.   On: 04/24/2014 19:25   Ct Angio Chest Pe W/cm &/or Wo Cm  04/24/2014   CLINICAL DATA:  Acute onset of shortness of breath for 12 hours. Initial encounter.  EXAM: CT ANGIOGRAPHY CHEST WITH CONTRAST  TECHNIQUE: Multidetector CT imaging of the chest was performed using the standard protocol during bolus administration of intravenous contrast. Multiplanar CT image reconstructions and MIPs were obtained to  evaluate the vascular anatomy.  CONTRAST:  14mL OMNIPAQUE IOHEXOL 350 MG/ML SOLN  COMPARISON:  Chest radiograph performed earlier today at 6:58 p.m., and CT of the chest performed 05/27/2012  FINDINGS: There is no evidence of pulmonary embolus.  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 Scattered associated calcification is seen. No definite new masses are identified.  There are also mild patchy and nodular airspace opacities within the upper lobes bilaterally, which could reflect a mild infectious process. There is no evidence of pleural effusion or pneumothorax.  Diffuse mediastinal lymphadenopathy is more prominent than on the prior study, with a azygoesophageal recess node measuring 1.9 cm in short axis. Mildly enlarged bilateral hilar nodes are also increased in prominence, measuring up to 1.3 cm in short axis. Diffuse coronary artery calcifications are seen.  No pericardial effusion is identified. Scattered calcific atherosclerotic disease is noted along the aortic arch and proximal great vessels. No axillary lymphadenopathy is seen. The thyroid gland is unremarkable in appearance.  The visualized portions of the liver and spleen are unremarkable.  No acute osseous abnormalities are seen.  Review of the MIP images confirms the above findings.  IMPRESSION: 1. No evidence of pulmonary embolus. 2. Mild patchy and nodular airspace opacities within the upper lobes bilaterally, concerning for an acute infectious process. 3. Diffuse asbestos-related pleural disease, with subpleural consolidation and plaques, and scattered calcification, similar in appearance to 2014. No new masses seen. 4. Increased prominence of mediastinal and bilateral hilar lymphadenopathy. This could reflect a systemic conditions such as sarcoidosis; lymphoma or metastatic disease cannot be excluded. Several of these nodes would be  amenable to biopsy. 5. Diffuse coronary artery calcifications seen.   Electronically Signed   By: Garald Balding M.D.   On: 04/24/2014 22:29    EKG:  Independently reviewed.  Assessment/Plan Principal Problem:   HCAP (healthcare-associated pneumonia) Active Problems:   GERD   HTN (hypertension)   CAD (coronary artery disease), native coronary artery   Presence of drug coated stent in left circumflex coronary artery: Promus DES 3.5 mm x 38 mm (3.9 mm)   1. HCAP - on PNA pathway 1. Cefepime and vanc 2. Cultures pending 3. Pulse ox 2. CAD - 1. Continue plavix 2. Just had cardiac work up last week, stress test was reassuring. 3. Does have a known occlusion of RCA and 70% OM stenosis which were not amenable to stenting 4. S/P circumflex stent 5. Minimal LAD disease 3. GERD - has reoccurred these past 3 weeks after 20 years essentially in remission following a Nissen done in the mid 1990s.  Was started on 40mg  protonix but continues to have symptoms 1. Increase protonix dose to 80 mg 2. Given profound history of GERD, likely warrants GI follow up ([place], specifically requests Delfin Edis.)  Suspect that he is probably due for an upper EGD anyhow (last was 10 years ago as far as I can tell, colonoscopy in 2012). 4. HTN - continue home meds    Code Status: Full  Family Communication: Son at bedside Disposition Plan: Admit to inpatient   Time spent: 24 min  Daelynn Blower M. [facility] Pager [phone]  If 7AM-7PM, please contact the day team taking care of the patient [email or web address] Password [email or web address] 04/24/2014, 11:44 PM

## 2014-04-25 ENCOUNTER — Ambulatory Visit: Payer: Medicare Other | Admitting: Physician Assistant

## 2014-04-25 LAB — HIV ANTIBODY (ROUTINE TESTING W REFLEX): HIV 1&2 Ab, 4th Generation: NONREACTIVE

## 2014-04-25 LAB — STREP PNEUMONIAE URINARY ANTIGEN: Strep Pneumo Urinary Antigen: NEGATIVE

## 2014-04-25 LAB — LEGIONELLA ANTIGEN, URINE

## 2014-04-25 MED ORDER — VANCOMYCIN HCL IN DEXTROSE 750-5 MG/150ML-% IV SOLN: 750.0000 mg | Freq: Two times a day (BID) | INTRAVENOUS | Status: DC

## 2014-04-25 MED ORDER — IPRATROPIUM-ALBUTEROL 0.5-2.5 (3) MG/3ML IN SOLN
3.0000 mL | Freq: Three times a day (TID) | RESPIRATORY_TRACT | Status: DC
  Administered 2014-04-25 – 2014-05-02 (×20): 3 mL via RESPIRATORY_TRACT
  Filled 2014-04-25 (×20): qty 3

## 2014-04-25 MED ORDER — HYDROCODONE-ACETAMINOPHEN 5-325 MG PO TABS
1.0000 | ORAL_TABLET | ORAL | Status: DC | PRN
  Administered 2014-04-26 – 2014-05-01 (×7): 2 via ORAL
  Administered 2014-05-02: 1 via ORAL
  Filled 2014-04-25: qty 2
  Filled 2014-04-25: qty 1
  Filled 2014-04-25 (×6): qty 2

## 2014-04-25 MED ORDER — HYDROCOD POLST-CHLORPHEN POLST 10-8 MG/5ML PO LQCR
5.0000 mL | Freq: Once | ORAL | Status: AC
Start: 2014-04-25 — End: 2014-04-25
  Administered 2014-04-25: 5 mL via ORAL
  Filled 2014-04-25: qty 5

## 2014-04-25 MED ORDER — IPRATROPIUM-ALBUTEROL 0.5-2.5 (3) MG/3ML IN SOLN
3.0000 mL | Freq: Four times a day (QID) | RESPIRATORY_TRACT | Status: DC
  Administered 2014-04-25: 3 mL via RESPIRATORY_TRACT
  Filled 2014-04-25: qty 3

## 2014-04-25 MED ORDER — VANCOMYCIN HCL IN DEXTROSE 750-5 MG/150ML-% IV SOLN
750.0000 mg | Freq: Two times a day (BID) | INTRAVENOUS | Status: DC
  Administered 2014-04-25 – 2014-04-26 (×4): 750 mg via INTRAVENOUS
  Filled 2014-04-25 (×5): qty 150

## 2014-04-25 MED ORDER — IPRATROPIUM-ALBUTEROL 0.5-2.5 (3) MG/3ML IN SOLN: 3.0000 mL | RESPIRATORY_TRACT | Status: DC | PRN

## 2014-04-25 MED ORDER — HYDROMORPHONE HCL 1 MG/ML IJ SOLN
1.0000 mg | INTRAMUSCULAR | Status: DC | PRN
  Administered 2014-04-25 – 2014-05-01 (×14): 1 mg via INTRAVENOUS
  Filled 2014-04-25 (×14): qty 1

## 2014-04-25 NOTE — ED Notes (Signed)
Awake. Verbally responsive. A/O x4. Resp even and unlabored. No audible adventitious breath sounds noted. ABC's intact. SR on monitor at 92bpm. Pt had no adverse reaction to IV ABT.

## 2014-04-25 NOTE — Progress Notes (Signed)
CARE MANAGEMENT NOTE 04/25/2014  Patient:  Curtis [name], Curtis [name]   Account Number:  [id number]  Date Initiated:  04/25/2014  Documentation initiated by:  Curtis [name]  Subjective/Objective Assessment:   68 yo male admitted with HCAP from home alone     Action/Plan:   discharge planning   Anticipated DC Date:  04/27/2014   Anticipated DC Plan:  [facility] referral  Clinical Social Worker      DC Planning Services  CM consult      Choice offered to / List presented to:             Status of service:  In process, will continue to follow Medicare Important Message given?   (If response is "NO", the following Medicare IM given date fields will be blank) Date Medicare IM given:   Medicare IM given by:   Date Additional Medicare IM given:   Additional Medicare IM given by:    Discharge Disposition:    Per UR Regulation:    If discussed at Long Length of Stay Meetings, dates discussed:    Comments:  04/25/14 Curtis Shell RN BSN CM [phone] Patient lives alone and uses a cane, has no other DME in house. Son and family live next door and assist with transportation, grocery shopping and other needs. Patient stated that his mobility has declined as his neuropathy has worsened. Await PT recommendations.

## 2014-04-25 NOTE — ED Notes (Signed)
Awake. Verbally responsive. A/O x4. Resp even and unlabored. No audible adventitious breath sounds noted. ABC's intact. Vancomycin infusing on admission.

## 2014-04-25 NOTE — Progress Notes (Signed)
Patient ID: Curtis [name], male   DOB: 02-03-1946, 68 y.o.   MRN: [id number]   [facility] PROGRESS NOTE  Curtis [name] CVE:[id number] DOB: 08/28/1945 DOA: 04/24/2014 PCP: [name], MD  Brief narrative: 68 y.o. male with CAD, NSTEMI back in Sept, got a stent put in his circumflex at that time, see Dr. Thurman Coyer H+P done last week for cardiac details. Was admitted to Cardiology service last week due to SOB, work up that time including stress test which was reassuring. Patient presented to the ED with c/o SOB, cough, congestion, cough productive of yellowish sputum.   Work up in ED shows findings suspicious for bilateral PNA in BUL on CT scan, not seen on CXR.  Assessment and Plan:    Principal Problem:   HCAP (healthcare-associated pneumonia) - pt reports feeling better this AM - will continue broad spectrum ABX Vanc and Maxipime day #2, readjust the regimen as clinically indicated - provide oxygen, BD' scheduled and as needed    Increased prominence of mediastinal and bilateral hilar lymphadenopathy - will discuss with PCCM if inpatient evaluation with biopsy needed or can be done in na outpatient setting  Active Problems:   Anemia of chronic disease, iron def - no signs of active bleeding - repeat CBC in AM   HTN - reasonable inpatient control - continue Metoprolol and Norvasc   HLD - continue statin    GERD - stable, continue Protonix    CAD (coronary artery disease), native coronary artery - continue aspirin and plavix   Presence of drug coated stent in left circumflex coronary artery: Promus DES 3.5 mm x 38 mm (3.9 mm)  DVT prophylaxis  Heparin SQ while pt is in hospital  Code Status: Full Family Communication: Pt at bedside Disposition Plan: Home when medically stable  IV Access:   Peripheral IV Procedures and diagnostic studies:   Dg Chest 2 View  04/24/2014  New trace pleural effusions with a few Kerley B-lines likely indicating developing  interstitial edema.    Ct Angio Chest Pe W/cm &/or Wo Cm  04/24/2014   1. No evidence of pulmonary embolus. 2. Mild patchy and nodular airspace opacities within the upper lobes bilaterally, concerning for an acute infectious process. 3. Diffuse asbestos-related pleural disease, with subpleural consolidation and plaques, and scattered calcification, similar in appearance to 2014. No new masses seen. 4. Increased prominence of mediastinal and bilateral hilar lymphadenopathy. This could reflect a systemic conditions such as sarcoidosis; lymphoma or metastatic disease cannot be excluded. Several of these nodes would be amenable to biopsy. Medical Consultants:   None Other Consultants:   Physical therapy  Anti-Infectives:   Vancomycin 12.21 --> Maxipime 12/21 -->  [name], MD  [facility] Pager [phone]  If 7PM-7AM, please contact night-coverage [email or web address] Password [email or web address] 04/25/2014, 11:04 AM   LOS: 1 day   HPI/Subjective: No events overnight.   Objective: Filed Vitals:   04/24/14 2330 04/25/14 0000 04/25/14 0038 04/25/14 0540  BP: 166/67 153/78 172/77 122/54  Pulse: 82 92 88 74  Temp:   98.6 F (37 C) 98.8 F (37.1 C)  TempSrc:   Oral Oral  Resp:   18 18  SpO2: 97% 94% 96% 98%    Intake/Output Summary (Last 24 hours) at 04/25/14 1104 Last data filed at 04/25/14 0500  Gross per 24 hour  Intake    200 ml  Output    500 ml  Net   -300 ml    Exam:   General:  Pt is  alert, follows commands appropriately, not in acute distress  Cardiovascular: Regular rate and rhythm, S1/S2, no murmurs, no rubs, no gallops  Respiratory: Clear to auscultation bilaterally, bilateral rhonchi   Abdomen: Soft, non tender, non distended, bowel sounds present, no guarding  Extremities: No edema, pulses DP and PT palpable bilaterally  Neuro: Grossly nonfocal  Data Reviewed: Basic Metabolic Panel:  Recent Labs Lab 04/18/14 1600 04/24/14 1819  NA 140 136*  K 4.2 3.9  CL 100 98   CO2 24 24  GLUCOSE 123* 107*  BUN 18 13  CREATININE 1.21 1.27  CALCIUM 9.8 9.6   Liver Function Tests:  Recent Labs Lab 04/18/14 1600 04/24/14 1819  AST 19 19  ALT 17 18  ALKPHOS 105 105  BILITOT 0.5 0.3  PROT 8.0 7.6  ALBUMIN 3.9 3.8   CBC:  Recent Labs Lab 04/24/14 1819  WBC 3.9*  NEUTROABS 2.4  HGB 10.1*  HCT 32.3*  MCV 90.5  PLT 284   Cardiac Enzymes:  Recent Labs Lab 04/24/14 1819  TROPONINI <0.30   Scheduled Meds: . alprazolam  2 mg Oral BID  . amitriptyline  75 mg Oral QHS  . amLODipine  2.5 mg Oral QPM  . aspirin  81 mg Oral Daily  . atorvastatin  40 mg Oral q1800  . ceFEPime  IV  1 g Intravenous 3 times per day  . clopidogrel  75 mg Oral Daily  . finasteride  5 mg Oral Daily  . gabapentin  300 mg Oral TID  . heparin  5,000 Units Subcutaneous 3 times per day  . lubiprostone  24 mcg Oral BID WC  . metoprolol  25 mg Oral BID  . pantoprazole  80 mg Oral Daily  . tamsulosin  0.4 mg Oral QHS  . vancomycin  750 mg Intravenous Q12H   Continuous Infusions:

## 2014-04-25 NOTE — Progress Notes (Signed)
ANTIBIOTIC CONSULT NOTE - INITIAL  Pharmacy Consult for Cefepime and Vancomycin Indication: HCAP  Allergies  Allergen Reactions  . Codeine Itching    Patient Measurements:   Wt=87 kg  Vital Signs: Temp: 98 F (36.7 C) (12/21 1718) Temp Source: Oral (12/21 1718) BP: 166/67 mmHg (12/21 2330) Pulse Rate: 82 (12/21 2330) Intake/Output from previous day: 12/21 0701 - 12/22 0700 In: -  Out: 500 [Urine:500] Intake/Output from this shift: Total I/O In: -  Out: 500 [Urine:500]  Labs:  Recent Labs  04/24/14 1819  WBC 3.9*  HGB 10.1*  PLT 284  CREATININE 1.27   Estimated Creatinine Clearance: 60.3 mL/min (by C-G formula based on Cr of 1.27). No results for input(s): VANCOTROUGH, VANCOPEAK, VANCORANDOM, GENTTROUGH, GENTPEAK, GENTRANDOM, TOBRATROUGH, TOBRAPEAK, TOBRARND, AMIKACINPEAK, AMIKACINTROU, AMIKACIN in the last 72 hours.   Microbiology: No results found for this or any previous visit (from the past 720 hour(s)).  Medical History: Past Medical History  Diagnosis Date  . Arthritis   . SOB (shortness of breath)   . Pulmonary asbestosis   . Hernia of unspecified site of abdominal cavity without mention of obstruction or gangrene     hiatal  . GERD (gastroesophageal reflux disease)   . Depression   . Diverticulitis     hospital 2011 [facility]  . Diverticulosis   . Reflex sympathetic dystrophy   . 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 Hypertension   . IBS (irritable bowel syndrome)   . Peripheral vascular disease     ankle brachial index of 0.79 on the right and 0.61 on the left.   . Myocardial infarction 01/2014    NSTEMI  . Hyperlipidemia LDL goal <70 04/20/2014  . AAA (abdominal aortic aneurysm), stable 04/20/2014    Medications:  Scheduled:  . alprazolam  2 mg Oral BID  . amitriptyline  75 mg Oral QHS  . amLODipine  2.5 mg Oral QPM  . aspirin  81 mg Oral Daily  . atorvastatin  40 mg Oral q1800  . clopidogrel  75 mg Oral Daily  . finasteride  5 mg Oral BH-q7a  .  gabapentin  300 mg Oral TID  . heparin  5,000 Units Subcutaneous 3 times per day  . lubiprostone  24 mcg Oral BID WC  . metoprolol  25 mg Oral BID  . pantoprazole  80 mg Oral Daily  . tamsulosin  0.4 mg Oral QHS  . [START ON 04/26/2014] vancomycin  750 mg Intravenous Q12H   Infusions:  . ceFEPime (MAXIPIME) IV    . vancomycin 1,000 mg (04/24/14 2355)   Assessment: 3 yoM c/o SOB, cough and congestion recent hospitalization for cardiac work-up due to SOB. Cefepime and Vancomycin per Rx for HCAP.   Goal of Therapy:  Vancomycin trough level 15-20 mcg/ml  Plan:   Cefepime 1Gm IV q8h   Vancomycin 1Gm x1 then 750mg  IV q12h  F/U SCr/lcultures/levels as needed  Lawana Pai R 04/25/2014,12:11 AM

## 2014-04-25 NOTE — Progress Notes (Signed)
PT Cancellation Note  Patient Details Name: NYREE YONKER MRN: [id number] DOB: [birth year]   Cancelled Treatment:    Reason Eval/Treat Not Completed: Patient declined, no reason specified (pt reports too weak today and has not had any sleep, agreeable for PT to check back tomorrow)   York Ram E 04/25/2014, 2:02 PM Carmelia Bake, PT, DPT 04/25/2014 Pager: [phone]

## 2014-04-26 LAB — BASIC METABOLIC PANEL
ANION GAP: 6 (ref 5–15)
BUN: 13 mg/dL (ref 6–23)
CO2: 27 mmol/L (ref 19–32)
Calcium: 9.1 mg/dL (ref 8.4–10.5)
Chloride: 102 mEq/L (ref 96–112)
Creatinine, Ser: 1.29 mg/dL (ref 0.50–1.35)
GFR calc non Af Amer: 55 mL/min — ABNORMAL LOW (ref >90)
GFR, EST AFRICAN AMERICAN: 64 mL/min — AB (ref >90)
Glucose, Bld: 99 mg/dL (ref 70–99)
POTASSIUM: 4 mmol/L (ref 3.5–5.1)
Sodium: 135 mmol/L (ref 135–145)

## 2014-04-26 LAB — CBC
HCT: 31.1 % — ABNORMAL LOW (ref 39.0–52.0)
Hemoglobin: 10.1 g/dL — ABNORMAL LOW (ref 13.0–17.0)
MCH: 29.3 pg (ref 26.0–34.0)
MCHC: 32.5 g/dL (ref 30.0–36.0)
MCV: 90.1 fL (ref 78.0–100.0)
PLATELETS: 252 10*3/uL (ref 150–400)
RBC: 3.45 MIL/uL — AB (ref 4.22–5.81)
RDW: 15.4 % (ref 11.5–15.5)
WBC: 4.8 10*3/uL (ref 4.0–10.5)

## 2014-04-26 MED ORDER — GUAIFENESIN 100 MG/5ML PO SYRP
200.0000 mg | ORAL_SOLUTION | ORAL | Status: DC | PRN
  Administered 2014-04-26 – 2014-04-28 (×2): 200 mg via ORAL
  Filled 2014-04-26 (×3): qty 10

## 2014-04-26 NOTE — Progress Notes (Addendum)
Clinical Social Work Department BRIEF PSYCHOSOCIAL ASSESSMENT 04/26/2014  Patient:  Curtis [name], Curtis [name]     Account Number:  [id number]     Admit date:  04/24/2014  Clinical Social Worker:  Earlie Server  Date/Time:  04/26/2014 02:00 PM  Referred by:  Physician  Date Referred:  04/26/2014 Referred for  SNF Placement   Other Referral:   Interview type:  Patient Other interview type:    PSYCHOSOCIAL DATA Living Status:  ALONE Admitted from facility:   Level of care:   Primary support name:  Elta Guadeloupe Primary support relationship to patient:  CHILD, ADULT Degree of support available:   Adequate    CURRENT CONCERNS Current Concerns  Post-Acute Placement   Other Concerns:    SOCIAL WORK ASSESSMENT / PLAN CSW received referral in order to assist with DC planning. CSW reviewed chart and met with patient at bedside. CSW introduced myself and explained role.    Patient reports he lives at home alone and his son lives next door. Patient reports he does feel weak at times but worked with PT today and spoke about SNF placement with PT. CSW provided SNF list and explained Medicare coverage for SNF but patient refusing placement. Patient reports he feels weak at times but knows he can ambulate around his house safely. Patient reports that he is not agreeable to SNF and does not want to leave his house.  Patient agreeable for CSW to speak with son as well. CSW spoke with son who reports he does not want patient to go to a SNF and can assist as needed. Son also agreeable to [facility] and feels patient can safely be managed at home.     CSW made CM aware that patient is refusing SNF but agreeable to [facility]. CSW is signing off but available if needed.   Assessment/plan status:  No Further Intervention Required Other assessment/ plan:   Information/referral to community resources:   SNF information    PATIENT'S/FAMILY'S RESPONSE TO PLAN OF CARE: Patient alert and oriented but has flat affect. Patient  reports that "I'm too young to go to a nursing home" and reports he does not need that much care. Patient reports that he enjoys his space and privacy at home and will be safe with family assistance. Patient thanked CSW for visit but reports he does not have any needs at this time.      [place], [place] [phone]

## 2014-04-26 NOTE — Evaluation (Signed)
Physical Therapy Evaluation Patient Details Name: Curtis [name] MRN: [id number] DOB: Sep 27, 1945 Today's Date: 04/26/2014   History of Present Illness  Patient is a 68 y/o male with recent hx of cardioversion and L cardiac cath 9/15 also PMH of hypertension, PVD, AAA, COPD with pulmonary asbestosis admitted 04/24/14 for HCAP.  Clinical Impression  Pt admitted with above diagnosis. Pt currently with functional limitations due to the deficits listed below (see PT Problem List).  Pt will benefit from skilled PT to increase their independence and safety with mobility to allow discharge to the venue listed below.  Pt assisted with ambulation using RW and remained on 2L O2 during gait with SpO2 down to 84% however improved to 91% with pursed lip breathing.  Discussed ST-SNF upon d/c with pt and he would like more information in regards to payment, mentioned CSW would be in to discuss process with him.     Follow Up Recommendations SNF    Equipment Recommendations  None recommended by PT    Recommendations for Other Services       Precautions / Restrictions Precautions Precautions: Fall Precaution Comments: monitor sats Restrictions Weight Bearing Restrictions: No      Mobility  Bed Mobility Overal bed mobility: Needs Assistance Bed Mobility: Sit to Supine;Supine to Sit     Supine to sit: Min assist Sit to supine: Supervision   General bed mobility comments: verbal cues for technique, assist for trunk  Transfers Overall transfer level: Needs assistance Equipment used: Rolling walker (2 wheeled) Transfers: Sit to/from Stand Sit to Stand: Min assist         General transfer comment: verbal cues for safe technique  Ambulation/Gait Ambulation/Gait assistance: Min assist Ambulation Distance (Feet): 150 Feet Assistive device: Rolling walker (2 wheeled) Gait Pattern/deviations: Step-through pattern;Trunk flexed Gait velocity: decr   General Gait Details: very slow pace,  increased use of UEs through RW for support, monitored sats with SpO2 84% on 2L however improved with pursed lip breathing, 91% on 2L thereafter  Stairs            Wheelchair Mobility    Modified Rankin (Stroke Patients Only)       Balance                                             Pertinent Vitals/Pain Pain Assessment: No/denies pain    Home Living Family/patient expects to be discharged to:: Private residence Living Arrangements: Alone Available Help at Discharge: Family;Available PRN/intermittently (son lives nearby) Type of Home: House Home Access: Stairs to enter;Ramped entrance   Entrance Stairs-Number of Steps: 3 Home Layout: One level Home Equipment: Walker - 2 wheels;Cane - quad;Cane - single point Additional Comments: He has an 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     Prior Function Level of Independence: Independent with assistive device(s)         Comments: uses SPC     Hand Dominance        Extremity/Trunk Assessment               Lower Extremity Assessment: Generalized weakness;LLE deficits/detail   LLE Deficits / Details: residual L LE weakness, active knee flexion approx 100*, limited active DF     Communication   Communication: No difficulties  Cognition Arousal/Alertness: Awake/alert Behavior During Therapy: WFL for tasks assessed/performed Overall Cognitive  Status: Within Functional Limits for tasks assessed                      General Comments      Exercises        Assessment/Plan    PT Assessment Patient needs continued PT services  PT Diagnosis Difficulty walking;Generalized weakness   PT Problem List Decreased strength;Decreased activity tolerance;Decreased mobility;Decreased knowledge of use of DME;Cardiopulmonary status limiting activity  PT Treatment Interventions DME instruction;Gait training;Functional mobility training;Patient/family  education;Therapeutic activities;Therapeutic exercise   PT Goals (Current goals can be found in the Care Plan section) Acute Rehab PT Goals PT Goal Formulation: With patient Time For Goal Achievement: 05/03/14 Potential to Achieve Goals: Good    Frequency Min 3X/week   Barriers to discharge        Co-evaluation               End of Session Equipment Utilized During Treatment: Gait belt;Oxygen Activity Tolerance: Patient tolerated treatment well Patient left: in bed;with call bell/phone within reach;with bed alarm set Nurse Communication: Mobility status         Time: 4462-8638 PT Time Calculation (min) (ACUTE ONLY): 23 min   Charges:   PT Evaluation $Initial PT Evaluation Tier I: 1 Procedure PT Treatments $Gait Training: 8-22 mins   PT G Codes:          Jolaine Fryberger,KATHrine E 04/26/2014, 1:56 PM Carmelia Bake, PT, DPT 04/26/2014 Pager: [phone]

## 2014-04-26 NOTE — Progress Notes (Signed)
Patient ID: Curtis [name], male   DOB: [birth year], 68 y.o.   MRN: [id number]  [facility] PROGRESS NOTE  Curtis [name] QQP:[id number] DOB: 1945/08/22 DOA: 04/24/2014 PCP: Curtis Seller, MD   Brief narrative: 68 y.o. male with CAD, NSTEMI back in Sept, got a stent put in his circumflex at that time, see Dr. Thurman Coyer H+P done last week for cardiac details. Was admitted to Cardiology service last week due to SOB, work up that time including stress test which was reassuring. Patient presented to the ED with c/o SOB, cough, congestion, cough productive of yellowish sputum.   Work up in ED shows findings suspicious for bilateral PNA in BUL on CT scan, not seen on CXR.  Assessment and Plan:    Principal Problem:  HCAP (healthcare-associated pneumonia) - pt reports feeling better this AM - will continue broad spectrum ABX Vanc and Maxipime day #3, readjust the regimen as clinically indicated - transition to Levaquin in AM - provide oxygen, BD' scheduled and as needed   Increased prominence of mediastinal and bilateral hilar lymphadenopathy - will discuss with PCCM if inpatient evaluation with biopsy needed or can be done in na outpatient setting  Active Problems:  Anemia of chronic disease, iron def - no signs of active bleeding  HTN - reasonable inpatient control - continue Metoprolol and Norvasc  HLD - continue statin   GERD - stable, continue Protonix   CAD (coronary artery disease), native coronary artery - continue aspirin and plavix  Presence of drug coated stent in left circumflex coronary artery: Promus DES 3.5 mm x 38 mm (3.9 mm)  DVT prophylaxis  Heparin SQ while pt is in hospital  Code Status: Full Family Communication: Pt at bedside Disposition Plan: Home when medically stable, possibly in AM  IV Access:    Peripheral IV Procedures and diagnostic studies:    Dg Chest 2 View 04/24/2014 New trace pleural effusions with a few  Kerley B-lines likely indicating developing interstitial edema.   Ct Angio Chest Pe W/cm &/or Wo Cm 04/24/2014 1. No evidence of pulmonary embolus. 2. Mild patchy and nodular airspace opacities within the upper lobes bilaterally, concerning for an acute infectious process. 3. Diffuse asbestos-related pleural disease, with subpleural consolidation and plaques, and scattered calcification, similar in appearance to 2014. No new masses seen. 4. Increased prominence of mediastinal and bilateral hilar lymphadenopathy. This could reflect a systemic conditions such as sarcoidosis; lymphoma or metastatic disease cannot be excluded. Several of these nodes would be amenable to biopsy. Medical Consultants:    None Other Consultants:    Physical therapy  Anti-Infectives:    Vancomycin 12.21 -->  Maxipime 12/21 -->  [name], MD  [facility] Pager [phone]  If 7PM-7AM, please contact night-coverage [email or web address] Password [facility] 04/26/2014, 9:37 AM   LOS: 2 days   HPI/Subjective: No events overnight.   Objective: Filed Vitals:   04/25/14 2010 04/25/14 2227 04/26/14 0645 04/26/14 0752  BP:  154/71 128/48   Pulse:  84 84   Temp:  99.1 F (37.3 C) 100.1 F (37.8 C)   TempSrc:  Oral Oral   Resp:  18 20   SpO2: 100% 95%  92%    Intake/Output Summary (Last 24 hours) at 04/26/14 0937 Last data filed at 04/26/14 0925  Gross per 24 hour  Intake    360 ml  Output   1150 ml  Net   -790 ml    Exam:   General:  Pt is alert, follows commands appropriately, not  in acute distress  Cardiovascular: Regular rate and rhythm, S1/S2, no rubs, no gallops  Respiratory: Clear to auscultation bilaterally, no wheezing, mild rhonchi at bases  Abdomen: Soft, non tender, non distended, bowel sounds present, no guarding  Extremities: No edema, pulses DP and PT palpable bilaterally  Neuro: Grossly nonfocal  Data Reviewed: Basic Metabolic Panel:  Recent Labs Lab 04/24/14 1819  04/26/14 0542  NA 136* 135  K 3.9 4.0  CL 98 102  CO2 24 27  GLUCOSE 107* 99  BUN 13 13  CREATININE 1.27 1.29  CALCIUM 9.6 9.1   Liver Function Tests:  Recent Labs Lab 04/24/14 1819  AST 19  ALT 18  ALKPHOS 105  BILITOT 0.3  PROT 7.6  ALBUMIN 3.8   CBC:  Recent Labs Lab 04/24/14 1819 04/26/14 0542  WBC 3.9* 4.8  NEUTROABS 2.4  --   HGB 10.1* 10.1*  HCT 32.3* 31.1*  MCV 90.5 90.1  PLT 284 252   Cardiac Enzymes:  Recent Labs Lab 04/24/14 1819  TROPONINI <0.30   Recent Results (from the past 240 hour(s))  Culture, blood (routine x 2)     Status: None (Preliminary result)   Collection Time: 04/24/14 10:53 PM  Result Value Ref Range Status   Specimen Description BLOOD LEFT ARM  Final   Special Requests BOTTLES DRAWN AEROBIC ONLY 5CC  Final   Culture  Setup Time   Final    04/25/2014 04:47 Performed at [facility]    Culture   Final           BLOOD CULTURE RECEIVED NO GROWTH TO DATE CULTURE WILL BE HELD FOR 5 DAYS BEFORE ISSUING A FINAL NEGATIVE REPORT Performed at [facility]    Report Status PENDING  Incomplete  Culture, blood (routine x 2)     Status: None (Preliminary result)   Collection Time: 04/24/14 10:53 PM  Result Value Ref Range Status   Specimen Description BLOOD RIGHT ARM  Final   Special Requests BOTTLES DRAWN AEROBIC AND ANAEROBIC 5CC  Final   Culture  Setup Time   Final    04/25/2014 04:47 Performed at [facility]    Culture   Final           BLOOD CULTURE RECEIVED NO GROWTH TO DATE CULTURE WILL BE HELD FOR 5 DAYS BEFORE ISSUING A FINAL NEGATIVE REPORT Performed at [facility]    Report Status PENDING  Incomplete     Scheduled Meds: . alprazolam  2 mg Oral BID  . amitriptyline  75 mg Oral QHS  . amLODipine  2.5 mg Oral QPM  . aspirin  81 mg Oral Daily  . atorvastatin  40 mg Oral q1800  . ceFEPime (MAXIPIME) IV  1 g Intravenous 3 times per day  . clopidogrel  75 mg Oral Daily  . finasteride   5 mg Oral Daily  . gabapentin  300 mg Oral TID  . heparin  5,000 Units Subcutaneous 3 times per day  . ipratropium-albuterol  3 mL Nebulization TID  . lubiprostone  24 mcg Oral BID WC  . metoprolol  25 mg Oral BID  . pantoprazole  80 mg Oral Daily  . tamsulosin  0.4 mg Oral QHS  . vancomycin  750 mg Intravenous Q12H   Continuous Infusions:

## 2014-04-26 NOTE — Progress Notes (Signed)
CARE MANAGEMENT NOTE 04/26/2014  Patient:  Curtis [name], Curtis [name]   Account Number:  [id number]  Date Initiated:  04/25/2014  Documentation initiated by:  Curtis [name]  Subjective/Objective Assessment:   68 yo male admitted with HCAP from home alone     Action/Plan:   discharge planning   Anticipated DC Date:  04/27/2014   Anticipated DC Plan:  [place] referral  Clinical Social Worker      DC Planning Services  CM consult      Choice offered to / List presented to:  C-1 Patient           Status of service:  In process, will continue to follow Medicare Important Message given?   (If response is "NO", the following Medicare IM given date fields will be blank) Date Medicare IM given:   Medicare IM given by:   Date Additional Medicare IM given:   Additional Medicare IM given by:    Discharge Disposition:    Per UR Regulation:    If discussed at Long Length of Stay Meetings, dates discussed:    Comments:  04/26/14 Curtis Shell RN BSN CM [phone] Patient declined rehab at [facility] and preferred [facility] PT and requested this CM speak with his son Curtis [name]. His son agreed and stated that he prefer the patient come home with [facility] services. Discussed HH choice and agencies that serve [facility] and they chose [facility]. Will follow for orders and referral communicated to [facility] liaison.  04/25/14 Curtis Shell RN BSN CM [phone] Patient lives alone and uses a cane, has no other DME in house. Son and family live next door and assist with transportation, grocery shopping and other needs. Patient stated that his mobility has declined as his neuropathy has worsened. Await PT recommendations.

## 2014-04-27 ENCOUNTER — Inpatient Hospital Stay (HOSPITAL_COMMUNITY): Payer: Medicare Other

## 2014-04-27 DIAGNOSIS — T17998A Other foreign object in respiratory tract, part unspecified causing other injury, initial encounter: Secondary | ICD-10-CM

## 2014-04-27 DIAGNOSIS — J9601 Acute respiratory failure with hypoxia: Secondary | ICD-10-CM

## 2014-04-27 LAB — URINE MICROSCOPIC-ADD ON

## 2014-04-27 LAB — CBC
HEMATOCRIT: 28.9 % — AB (ref 39.0–52.0)
HEMOGLOBIN: 9.4 g/dL — AB (ref 13.0–17.0)
MCH: 29.1 pg (ref 26.0–34.0)
MCHC: 32.5 g/dL (ref 30.0–36.0)
MCV: 89.5 fL (ref 78.0–100.0)
Platelets: 260 10*3/uL (ref 150–400)
RBC: 3.23 MIL/uL — AB (ref 4.22–5.81)
RDW: 15.1 % (ref 11.5–15.5)
WBC: 5.1 10*3/uL (ref 4.0–10.5)

## 2014-04-27 LAB — URINALYSIS, ROUTINE W REFLEX MICROSCOPIC
Bilirubin Urine: NEGATIVE
Glucose, UA: NEGATIVE mg/dL
Ketones, ur: NEGATIVE mg/dL
Leukocytes, UA: NEGATIVE
NITRITE: NEGATIVE
Protein, ur: 30 mg/dL — AB
SPECIFIC GRAVITY, URINE: 1.027 (ref 1.005–1.030)
UROBILINOGEN UA: 0.2 mg/dL (ref 0.0–1.0)
pH: 5 (ref 5.0–8.0)

## 2014-04-27 LAB — BASIC METABOLIC PANEL
Anion gap: 5 (ref 5–15)
BUN: 14 mg/dL (ref 6–23)
CHLORIDE: 101 meq/L (ref 96–112)
CO2: 27 mmol/L (ref 19–32)
Calcium: 8.8 mg/dL (ref 8.4–10.5)
Creatinine, Ser: 1.34 mg/dL (ref 0.50–1.35)
GFR calc Af Amer: 61 mL/min — ABNORMAL LOW (ref >90)
GFR calc non Af Amer: 52 mL/min — ABNORMAL LOW (ref >90)
GLUCOSE: 106 mg/dL — AB (ref 70–99)
POTASSIUM: 4.3 mmol/L (ref 3.5–5.1)
SODIUM: 133 mmol/L — AB (ref 135–145)

## 2014-04-27 LAB — VANCOMYCIN, TROUGH: Vancomycin Tr: 20.3 ug/mL — ABNORMAL HIGH (ref 10.0–20.0)

## 2014-04-27 LAB — BRAIN NATRIURETIC PEPTIDE: B Natriuretic Peptide: 73.5 pg/mL (ref 0.0–100.0)

## 2014-04-27 MED ORDER — SODIUM CHLORIDE 0.9 % IV BOLUS (SEPSIS)
500.0000 mL | Freq: Once | INTRAVENOUS | Status: AC
  Administered 2014-04-27: 500 mL via INTRAVENOUS

## 2014-04-27 MED ORDER — ALBUTEROL SULFATE (2.5 MG/3ML) 0.083% IN NEBU
2.5000 mg | INHALATION_SOLUTION | RESPIRATORY_TRACT | Status: DC | PRN
  Administered 2014-04-28 – 2014-04-29 (×2): 2.5 mg via RESPIRATORY_TRACT
  Filled 2014-04-27 (×2): qty 3

## 2014-04-27 MED ORDER — POLYETHYLENE GLYCOL 3350 17 G PO PACK
17.0000 g | PACK | Freq: Every day | ORAL | Status: DC
  Administered 2014-04-27 – 2014-05-01 (×5): 17 g via ORAL
  Filled 2014-04-27 (×6): qty 1

## 2014-04-27 MED ORDER — VANCOMYCIN HCL 500 MG IV SOLR
500.0000 mg | Freq: Two times a day (BID) | INTRAVENOUS | Status: DC
  Administered 2014-04-27 – 2014-04-30 (×7): 500 mg via INTRAVENOUS
  Filled 2014-04-27 (×7): qty 500

## 2014-04-27 MED ORDER — SENNOSIDES-DOCUSATE SODIUM 8.6-50 MG PO TABS
1.0000 | ORAL_TABLET | Freq: Two times a day (BID) | ORAL | Status: DC
  Administered 2014-04-27 – 2014-05-02 (×9): 1 via ORAL
  Filled 2014-04-27 (×12): qty 1

## 2014-04-27 MED ORDER — ACETAMINOPHEN 325 MG PO TABS
325.0000 mg | ORAL_TABLET | Freq: Once | ORAL | Status: AC
  Administered 2014-04-27: 325 mg via ORAL
  Filled 2014-04-27: qty 1

## 2014-04-27 MED ORDER — ACETAMINOPHEN 325 MG PO TABS
650.0000 mg | ORAL_TABLET | ORAL | Status: DC | PRN
  Administered 2014-05-01: 650 mg via ORAL
  Filled 2014-04-27: qty 2

## 2014-04-27 NOTE — Consult Note (Signed)
Name: Curtis [name] MRN: [id number] DOB: 07-Jan-1946    ADMISSION DATE:  04/24/2014 CONSULTATION DATE:  04/27/14  REFERRING MD :  [name]   CHIEF COMPLAINT:  Fever, PNA, Abnormal CT Chest   BRIEF PATIENT DESCRIPTION: 68 y/o M  SIGNIFICANT EVENTS  12/21  Admit with worsening shortness of breath, cough.  CTA with concern for nodular upper lobe airspace disease  12/23  Fever of 103 despite abx, PCCM consulted for evaluation   STUDIES:  12/21  CTA Chest >> neg for PE, patchy bilateral nodular upper lobe airspace dz, diffuse asbestos related pleural disease, increased mediastinal / bilateral hilar LAN 12/24  2V CXR >> RLL infiltrate  HISTORY OF PRESENT ILLNESS:  68 y/o M, former smoker,  with PMH of GERD s/p Nissen on protonix (followed by [name]), Hernia, Depression, HTN, PVD, HLD, MI s/p stent to circumflex on plavix ([name], 01/17/2014), CAD with RCA / 70% OM stenosis (not amenable to stent), AAA, SVT s/p ablation (01/17/2014), COPD, Asbestosis and recent admission per [name] for SOB.  Stress test at that time was reassuring.  He presented to [facility] ER on 12/21 with increased SOB, cough with yellow sputum production and congestion.  The patient reports cough developed 12/20 with yellow sputum production, central chest tightness and progressively became more short of breath to the point he called his son home from work.  After his son arrived, he immediately called [phone] out of concern for SOB. Initial ER evaluation included a CTA of the chest which was negative for PE but noted upper lobe nodular airspace disease and chronic diffuse asbestos related pleural disease, increased mediastinal / hilar LAN.  The patient was admitted by [facility] and treated with IV cefepime and vancomycin for HCAP.  The patient was clinically doing well with minimal oxygen requirements.  On 12/24, he was noted to have fever of 103 and PCCM consulted for evaluation of fever / HCAP.      PAST MEDICAL HISTORY :   has  a past medical history of Arthritis; SOB (shortness of breath); Pulmonary asbestosis; Hernia of unspecified site of abdominal cavity without mention of obstruction or gangrene; GERD (gastroesophageal reflux disease); Depression; Diverticulitis; Diverticulosis; Reflex sympathetic dystrophy; 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); Hypertension; IBS (irritable bowel syndrome); Peripheral vascular disease; Myocardial infarction (01/2014); Hyperlipidemia LDL goal <70 (04/20/2014); and AAA (abdominal aortic aneurysm), stable (04/20/2014).  has past surgical history that includes Nissen fundoplication; right elbow surgery; right knee arthroscopy; left shoulder ; squamous cell skin cancer; Cataract extraction; supraventricular tachycardia ablation (N/A, 01/16/2014); and left heart catheterization with coronary angiogram (N/A, 01/17/2014).   HOME MEDICATIONS:  Prior to Admission medications   Medication Sig Start Date End Date Taking? Authorizing Provider  alprazolam Curtis [name]) 2 MG tablet Take 2 mg by mouth 2 (two) times daily.   Yes Historical Provider, MD  amitriptyline (ELAVIL) 75 MG tablet Take 75 mg by mouth at bedtime.   Yes Historical Provider, MD  amLODipine (NORVASC) 2.5 MG tablet Take 1 tablet (2.5 mg total) by mouth daily. Patient taking differently: Take 2.5 mg by mouth every evening.  02/01/14  Yes Curtis Shi, PA-C  aspirin 81 MG chewable tablet Chew 1 tablet (81 mg total) by mouth daily. 01/20/14  Yes Curtis Parr, NP  atorvastatin (LIPITOR) 40 MG tablet Take 1 tablet (40 mg total) by mouth daily at 6 PM. 04/20/14  Yes Curtis Serge, NP  clopidogrel (PLAVIX) 75 MG tablet Take 1 tablet (75 mg total) by mouth daily. 04/20/14  Yes Curtis Serge, NP  finasteride (PROSCAR) 5 MG tablet Take 5 mg by mouth every morning.    Yes Historical Provider, MD  gabapentin (NEURONTIN) 300 MG capsule Take 300 mg by mouth 3 (three) times daily.    Yes Historical Provider, MD  lubiprostone (AMITIZA) 24 MCG capsule Take 24 mcg by mouth 2  (two) times daily with a meal.   Yes Historical Provider, MD  metoprolol (LOPRESSOR) 50 MG tablet Take 25 mg by mouth 2 (two) times daily.   Yes Historical Provider, MD  pantoprazole (PROTONIX) 40 MG tablet Take 1 tablet (40 mg total) by mouth daily. 04/20/14  Yes Curtis Serge, NP  tamsulosin (FLOMAX) 0.4 MG CAPS capsule Take 1 capsule (0.4 mg total) by mouth daily. Patient taking differently: Take 0.4 mg by mouth at bedtime.  09/08/13  Yes Curtis Krystal Eaton, MD  nitroGLYCERIN (NITROSTAT) 0.4 MG SL tablet Place 1 tablet (0.4 mg total) under the tongue every 5 (five) minutes x 3 doses as needed for chest pain. 04/20/14   Curtis Serge, NP   Allergies  Allergen Reactions  . Codeine Itching    FAMILY HISTORY:  family history includes Colon cancer in his maternal aunt, maternal uncle, and mother. There is no history of Heart attack or Stroke.   SOCIAL HISTORY:  reports that he quit smoking about 8 years ago. His smoking use included Cigarettes. He has a 125 pack-year smoking history. He quit smokeless tobacco use about 1 years ago. He reports that he does not drink alcohol or use illicit drugs.  REVIEW OF SYSTEMS:   Constitutional: Negative for fever, chills, weight loss, malaise/fatigue and diaphoresis.  HENT: Negative for hearing loss, ear pain, nosebleeds, congestion, sore throat, neck pain, tinnitus and ear discharge.   Eyes: Negative for blurred vision, double vision, photophobia, pain, discharge and redness.  Respiratory: Negative for hemoptysis, and stridor.  Positive for cough, minimal yellow sputum production, shortness of breath, wheezing and chest tightness Cardiovascular: Negative for chest pain, palpitations, orthopnea, claudication, leg swelling and PND.  Gastrointestinal: Negative for heartburn, nausea, vomiting, abdominal pain, diarrhea, constipation, blood in stool and melena.  Positive for difficulty swallowing, food "getting hung up"  Genitourinary: Negative for dysuria,  urgency, frequency, hematuria and flank pain.  Musculoskeletal: Negative for myalgias, back pain, joint pain and falls.  Skin: Negative for itching and rash.  Neurological: Negative for dizziness, tingling, tremors, sensory change, speech change, focal weakness, seizures, loss of consciousness, weakness and headaches.  Endo/Heme/Allergies: Negative for environmental allergies and polydipsia. Does not bruise/bleed easily.  SUBJECTIVE:   VITAL SIGNS: Temp:  [98.5 F (36.9 C)-103 F (39.4 C)] 103 F (39.4 C) (12/24 0649) Pulse Rate:  [74-101] 84 (12/24 0447) Resp:  [16-20] 20 (12/23 2109) BP: (122-157)/(50-63) 122/50 mmHg (12/24 0447) SpO2:  [92 %-98 %] 92 % (12/24 0805)  PHYSICAL EXAMINATION: General:  wdwn adult male in NAD Neuro:  AAOx4, speech clear, MAE  HEENT:  Mm pink/moist, no JVD Cardiovascular:  s1s2 rrr, distant tones, pulses equal bilaterally Lungs:  mildly labored but no distress, lungs distant bilaterally with few wheezes & scattered crackles on L and posterior lower bilaterally  Abdomen:  Obese, soft, bsx4 active  Musculoskeletal:  No acute deformities  Skin:  Warm/dry, no edema, no lesions / open wounds   Recent Labs Lab 04/24/14 1819 04/26/14 0542 04/27/14 0410  NA 136* 135 133*  K 3.9 4.0 4.3  CL 98 102 101  CO2 24 27 27   BUN 13 13  14  CREATININE 1.27 1.29 1.34  GLUCOSE 107* 99 106*    Recent Labs Lab 04/24/14 1819 04/26/14 0542 04/27/14 0410  HGB 10.1* 10.1* 9.4*  HCT 32.3* 31.1* 28.9*  WBC 3.9* 4.8 5.1  PLT 284 252 260   No results found.  ASSESSMENT / PLAN:  HCAP - new development of RLL infiltrate overnight 12/24 which was not on CTA from admission.  Given patients GI hx with prior Nissen (1994), reflux with aspiration is likely the reason for change in CXR and development of new fever. Fever  COPD with Chronic Bronchitis Asbestosis - followed by Dr. Annamaria Boots Former Smoker - up to 2ppd for 50 years, quit in 2007   Plan: Continue  cefepmine / vancomycin Pulmonary hygiene:  IS, mobilize as able  Aspiration precautions Patient likely needs a barium swallow with probable esophageal source of aspiration Consider GI input, if not inpatient, will need to see [name] post d/c  PRN tylenol for fever associated discomfort Duoneb TID with Q3 PRN albuterol  Follow up 2v CXR in am CBC in am  Wean oxygen for sats > 90% D/C continuous oxygen monitoring Assess for O2 needs prior to discharge  Noe Gens, NP-C [place] Pgr: [phone] or [phone]   04/27/2014, 9:41 AM  Attending:  I have seen and examined the patient with nurse practitioner/resident and agree with the note above.   On my exam he is breathing comfortably, lungs with some crackles bilaterally He reports to me that he has noted trouble swallowing for some time Chart reviewed, Nissen in 1994 and later he required esophageal dilation in 2006 Dr. Ardis Hughs with [facility] saw him earlier this month when he was hospitalized for chest discomfort and he thought a barium swallow would be helpful  I think the most likely explanation for his new fever and infiltrate is aspiration pneumonitis.  I've reviewed the CT from admission and today's CXR and there is a clear new RLL infiltrate.  The admission CT was really unimpressive to me for an acute lung infection, but did show significant chronic pleural changes from his asbestos exposure.  He has COPD but he is not in exacerbation right now  We have ordered a barium swallow, would consider GI consult pending that result.  Roselie Awkward, MD [place] [facility] Pager: [phone] Cell: [phone] If no response, call [phone]

## 2014-04-27 NOTE — Progress Notes (Signed)
Patient ID: Curtis [name], male   DOB: 1945-08-19, 68 y.o.   MRN: [id number] [facility] PROGRESS NOTE  Curtis [name] MHD:[id number] DOB: March 22, 1946 DOA: 04/24/2014 PCP: Curtis Seller, MD  Brief narrative: 68 y.o. male with CAD, NSTEMI back in Sept, got a stent put in his circumflex at that time, see Dr. Thurman [name] H+P done last week for cardiac details. Was admitted to Cardiology service last week due to SOB, work up that time including stress test which was reassuring. Patient presented to the ED with c/o SOB, cough, congestion, cough productive of yellowish sputum.   Work up in ED shows findings suspicious for bilateral PNA in BUL on CT scan, not seen on CXR.  Assessment and Plan:    Principal Problem:  Acute hypoxic respiratory failure, secondary to ? HCAP/aspiration  - unclear why pt still spiking fever, CXR requested this AM, PCCM consulted  - will continue broad spectrum ABX Vanc and Maxipime day #4 for now  - provide oxygen, BD' scheduled and as needed  - SLP requested, MBS   Increased prominence of mediastinal and bilateral hilar lymphadenopathy - PCCM consulted for further assistance  Active Problems:  Anemia of chronic disease, iron def - no signs of active bleeding  HTN - slightly soft this AM  - hold Norvasc but continue metoprolol for now - may need to hold if SBP < 90  HLD - continue statin   GERD - stable, continue Protonix   CAD (coronary artery disease), native coronary artery - continue aspirin and plavix  Presence of drug coated stent in left circumflex coronary artery: Promus DES 3.5 mm x 38 mm (3.9 mm)  DVT prophylaxis  Heparin SQ while pt is in hospital  Code Status: Full Family Communication: Pt at bedside Disposition Plan: Home when medically stable  IV Access:    Peripheral IV Procedures and diagnostic studies:    Dg Chest 2 View 04/24/2014 New trace pleural effusions with a few Kerley B-lines likely  indicating developing interstitial edema.   Ct Angio Chest Pe W/cm &/or Wo Cm 04/24/2014 1. No evidence of pulmonary embolus. 2. Mild patchy and nodular airspace opacities within the upper lobes bilaterally, concerning for an acute infectious process. 3. Diffuse asbestos-related pleural disease, with subpleural consolidation and plaques, and scattered calcification, similar in appearance to 2014. No new masses seen. 4. Increased prominence of mediastinal and bilateral hilar lymphadenopathy. This could reflect a systemic conditions such as sarcoidosis; lymphoma or metastatic disease cannot be excluded. Several of these nodes would be amenable to biopsy. Medical Consultants:    None Other Consultants:    Physical therapy  Anti-Infectives:    Vancomycin 12.21 -->  Maxipime 12/21 -->  Curtis Ramsay, MD  [facility] Pager [phone]  If 7PM-7AM, please contact night-coverage [email or web address] Password [email or web address] 04/27/2014, 2:18 PM   LOS: 3 days   HPI/Subjective: No events overnight.   Objective: Filed Vitals:   04/27/14 0649 04/27/14 0805 04/27/14 1040 04/27/14 1355  BP:    101/42  Pulse:    81  Temp: 103 F (39.4 C)   98.7 F (37.1 C)  TempSrc: Oral   Oral  Resp:    20  Height:   6' (1.829 m)   Weight:   90.855 kg (200 lb 4.8 oz)   SpO2:  92%  91%    Intake/Output Summary (Last 24 hours) at 04/27/14 1418 Last data filed at 04/27/14 1355  Gross per 24 hour  Intake    240 ml  Output  500 ml  Net   -260 ml    Exam:   General:  Pt is alert, follows commands appropriately, not in acute distress  Cardiovascular: Regular rate and rhythm, S1/S2, no murmurs, no rubs, no gallops  Respiratory: Clear to auscultation bilaterally, rhonchi at bases   Abdomen: Soft, non tender, non distended, bowel sounds present, no guarding  Extremities:  pulses DP and PT palpable bilaterally  Neuro: Grossly nonfocal  Data Reviewed: Basic Metabolic Panel:  Recent Labs Lab  04/24/14 1819 04/26/14 0542 04/27/14 0410  NA 136* 135 133*  K 3.9 4.0 4.3  CL 98 102 101  CO2 24 27 27   GLUCOSE 107* 99 106*  BUN 13 13 14   CREATININE 1.27 1.29 1.34  CALCIUM 9.6 9.1 8.8   Liver Function Tests:  Recent Labs Lab 04/24/14 1819  AST 19  ALT 18  ALKPHOS 105  BILITOT 0.3  PROT 7.6  ALBUMIN 3.8   CBC:  Recent Labs Lab 04/24/14 1819 04/26/14 0542 04/27/14 0410  WBC 3.9* 4.8 5.1  NEUTROABS 2.4  --   --   HGB 10.1* 10.1* 9.4*  HCT 32.3* 31.1* 28.9*  MCV 90.5 90.1 89.5  PLT 284 252 260   Cardiac Enzymes:  Recent Labs Lab 04/24/14 1819  TROPONINI <0.30   Recent Results (from the past 240 hour(s))  Culture, blood (routine x 2)     Status: None (Preliminary result)   Collection Time: 04/24/14 10:53 PM  Result Value Ref Range Status   Specimen Description BLOOD LEFT ARM  Final   Special Requests BOTTLES DRAWN AEROBIC ONLY 5CC  Final   Culture  Setup Time   Final    04/25/2014 04:47 Performed at [facility]    Culture   Final           BLOOD CULTURE RECEIVED NO GROWTH TO DATE CULTURE WILL BE HELD FOR 5 DAYS BEFORE ISSUING A FINAL NEGATIVE REPORT Performed at [facility]    Report Status PENDING  Incomplete  Culture, blood (routine x 2)     Status: None (Preliminary result)   Collection Time: 04/24/14 10:53 PM  Result Value Ref Range Status   Specimen Description BLOOD RIGHT ARM  Final   Special Requests BOTTLES DRAWN AEROBIC AND ANAEROBIC 5CC  Final   Culture  Setup Time   Final    04/25/2014 04:47 Performed at [facility]    Culture   Final           BLOOD CULTURE RECEIVED NO GROWTH TO DATE CULTURE WILL BE HELD FOR 5 DAYS BEFORE ISSUING A FINAL NEGATIVE REPORT Performed at [facility]    Report Status PENDING  Incomplete     Scheduled Meds: . alprazolam  2 mg Oral BID  . amitriptyline  75 mg Oral QHS  . amLODipine  2.5 mg Oral QPM  . aspirin  81 mg Oral Daily  . atorvastatin  40 mg Oral q1800  .  ceFEPime (MAXIPIME) IV  1 g Intravenous 3 times per day  . clopidogrel  75 mg Oral Daily  . finasteride  5 mg Oral Daily  . gabapentin  300 mg Oral TID  . heparin  5,000 Units Subcutaneous 3 times per day  . ipratropium-albuterol  3 mL Nebulization TID  . lubiprostone  24 mcg Oral BID WC  . metoprolol  25 mg Oral BID  . pantoprazole  80 mg Oral Daily  . polyethylene glycol  17 g Oral Daily  .  senna-docusate  1 tablet Oral BID  . tamsulosin  0.4 mg Oral QHS  . vancomycin  500 mg Intravenous Q12H   Continuous Infusions:

## 2014-04-27 NOTE — Progress Notes (Signed)
ANTIBIOTIC CONSULT NOTE - Follow up  Pharmacy Consult for vancomycin and cefepime Indication: HCAP  Allergies  Allergen Reactions  . Codeine Itching    Patient Measurements: Height: 6' (182.9 cm) Weight: 200 lb 4.8 oz (90.855 kg) IBW/kg (Calculated) : 77.6   Vital Signs: Temp: 103 F (39.4 C) (12/24 0649) Temp Source: Oral (12/24 0649) BP: 122/50 mmHg (12/24 0447) Pulse Rate: 84 (12/24 0447) Intake/Output from previous day: 12/23 0701 - 12/24 0700 In: 240 [P.O.:240] Out: 500 [Urine:500] Intake/Output from this shift: Total I/O In: 240 [P.O.:240] Out: -   Labs:  Recent Labs  04/24/14 1819 04/26/14 0542 04/27/14 0410  WBC 3.9* 4.8 5.1  HGB 10.1* 10.1* 9.4*  PLT 284 252 260  CREATININE 1.27 1.29 1.34   Estimated Creatinine Clearance: 57.1 mL/min (by C-G formula based on Cr of 1.34).  Recent Labs  04/27/14 1010  VANCOTROUGH 20.3*     Microbiology:   Medical History: Past Medical History  Diagnosis Date  . Arthritis   . SOB (shortness of breath)   . Pulmonary asbestosis   . Hernia of unspecified site of abdominal cavity without mention of obstruction or gangrene     hiatal  . GERD (gastroesophageal reflux disease)   . Depression   . Diverticulitis     hospital 2011 [facility]  . Diverticulosis   . Reflex sympathetic dystrophy   . 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 Hypertension   . IBS (irritable bowel syndrome)   . Peripheral vascular disease     ankle brachial index of 0.79 on the right and 0.61 on the left.   . Myocardial infarction 01/2014    NSTEMI  . Hyperlipidemia LDL goal <70 04/20/2014  . AAA (abdominal aortic aneurysm), stable 04/20/2014    Medications:  Scheduled:  . alprazolam  2 mg Oral BID  . amitriptyline  75 mg Oral QHS  . amLODipine  2.5 mg Oral QPM  . aspirin  81 mg Oral Daily  . atorvastatin  40 mg Oral q1800  . ceFEPime (MAXIPIME) IV  1 g Intravenous 3 times per day  . clopidogrel  75 mg Oral Daily  . finasteride  5 mg Oral Daily  .  gabapentin  300 mg Oral TID  . heparin  5,000 Units Subcutaneous 3 times per day  . ipratropium-albuterol  3 mL Nebulization TID  . lubiprostone  24 mcg Oral BID WC  . metoprolol  25 mg Oral BID  . pantoprazole  80 mg Oral Daily  . polyethylene glycol  17 g Oral Daily  . senna-docusate  1 tablet Oral BID  . tamsulosin  0.4 mg Oral QHS  . vancomycin  500 mg Intravenous Q12H    Infusions:      Microbiology: 12/21 Blood x2:NGTD 12/22  S. pneumo and legionella U Ag: both Neg 12/24  Blood x2: collected 12/24  Urine: collected  Anti-infectives: 12/21 >Zosyn >> x1 ED 12/21 >>vancomycin >>  12/22 >>cefepime >>   Assessment: 51 yoM c/o SOB, cough and congestion after recent hospitalization for cardiac work-up due to SOB. Cefepime and Vancomycin ordered per Rx for HCAP.   Today, 12/24: D#4 vancomycin 750 mg IV q12h D#4 cefepime 1 gram IV q8h Temp spike to 103  CXR: new RLL infiltrate  WBC remains normal Blood cultures from admission unrevealing Repeat blood cultures collected this AM SCr trending up slightly, though still WNL Vancomycin trough just above goal range.   Goal of Therapy:  Vancomycin trough level 15-20 mcg/ml  Appropriate antibiotic dosing for  indication and renal function; eradication of infection.   Plan:  1. Change vancomycin to 500 mg IV q12h 2. Continue present cefepime dosage, 1 gram IV q8h 3. Recheck serum creatinine in AM 4. Follow clinical course, renal function, culture results.  5. Recheck vancomycin trough at new steady-state, or sooner if events warrant.  Clayburn Pert, PharmD, BCPS Pager: [phone] 04/27/2014  11:29 AM

## 2014-04-28 ENCOUNTER — Inpatient Hospital Stay (HOSPITAL_COMMUNITY): Payer: Medicare Other

## 2014-04-28 DIAGNOSIS — T17908A Unspecified foreign body in respiratory tract, part unspecified causing other injury, initial encounter: Secondary | ICD-10-CM | POA: Insufficient documentation

## 2014-04-28 LAB — BASIC METABOLIC PANEL
ANION GAP: 5 (ref 5–15)
BUN: 16 mg/dL (ref 6–23)
CALCIUM: 8.4 mg/dL (ref 8.4–10.5)
CO2: 27 mmol/L (ref 19–32)
Chloride: 97 mEq/L (ref 96–112)
Creatinine, Ser: 1.32 mg/dL (ref 0.50–1.35)
GFR calc non Af Amer: 53 mL/min — ABNORMAL LOW (ref >90)
GFR, EST AFRICAN AMERICAN: 62 mL/min — AB (ref >90)
Glucose, Bld: 123 mg/dL — ABNORMAL HIGH (ref 70–99)
Potassium: 4.9 mmol/L (ref 3.5–5.1)
SODIUM: 129 mmol/L — AB (ref 135–145)

## 2014-04-28 LAB — CBC
HCT: 27.5 % — ABNORMAL LOW (ref 39.0–52.0)
Hemoglobin: 8.8 g/dL — ABNORMAL LOW (ref 13.0–17.0)
MCH: 28.5 pg (ref 26.0–34.0)
MCHC: 32 g/dL (ref 30.0–36.0)
MCV: 89 fL (ref 78.0–100.0)
PLATELETS: 263 10*3/uL (ref 150–400)
RBC: 3.09 MIL/uL — AB (ref 4.22–5.81)
RDW: 15.3 % (ref 11.5–15.5)
WBC: 6.1 10*3/uL (ref 4.0–10.5)

## 2014-04-28 LAB — URINE CULTURE
CULTURE: NO GROWTH
Colony Count: NO GROWTH

## 2014-04-28 MED ORDER — PIPERACILLIN-TAZOBACTAM 3.375 G IVPB
3.3750 g | Freq: Three times a day (TID) | INTRAVENOUS | Status: DC
  Administered 2014-04-28 – 2014-05-02 (×12): 3.375 g via INTRAVENOUS
  Filled 2014-04-28 (×13): qty 50

## 2014-04-28 MED ORDER — PIPERACILLIN-TAZOBACTAM 3.375 G IVPB 30 MIN
3.3750 g | Freq: Once | INTRAVENOUS | Status: AC
  Administered 2014-04-28: 3.375 g via INTRAVENOUS
  Filled 2014-04-28: qty 50

## 2014-04-28 MED ORDER — LORAZEPAM 2 MG/ML IJ SOLN
0.5000 mg | Freq: Once | INTRAMUSCULAR | Status: AC
  Administered 2014-04-28: 0.5 mg via INTRAVENOUS
  Filled 2014-04-28: qty 1

## 2014-04-28 MED ORDER — LORAZEPAM 0.5 MG PO TABS
0.5000 mg | ORAL_TABLET | Freq: Once | ORAL | Status: AC
  Administered 2014-04-28: 0.5 mg via ORAL
  Filled 2014-04-28: qty 1

## 2014-04-28 NOTE — Progress Notes (Signed)
Name: Curtis [name] MRN: [id number] DOB: [birth year]    ADMISSION DATE:  04/24/2014 CONSULTATION DATE:  04/27/14  REFERRING MD :  [name]   CHIEF COMPLAINT:  Fever, PNA, Abnormal CT Chest   BRIEF PATIENT DESCRIPTION: 35 yowm with fever and asd c/w CAP but high suspicion Asp   SIGNIFICANT EVENTS  12/21  Admit with worsening shortness of breath, cough.  CTA with concern for nodular upper lobe airspace disease  12/23  Fever of 103 despite abx, PCCM consulted for evaluation  12/24 Dg Es > frank asp/ procedure terminate 12/25 > npo   STUDIES:  12/21  CTA Chest >> neg for PE, patchy bilateral nodular upper lobe airspace dz, diffuse asbestos related pleural disease, increased mediastinal / bilateral hilar LAN 12/24  2V CXR >> RLL infiltrate   SUBJECTIVE:  ? Slowed mentation/ poor cough mechanics/ gurgling when asked to cough   VITAL SIGNS: Temp:  [98.3 F (36.8 C)-98.7 F (37.1 C)] 98.3 F (36.8 C) (12/25 0421) Pulse Rate:  [78-92] 78 (12/25 0421) Resp:  [18-20] 18 (12/25 0421) BP: (101-158)/(42-57) 158/49 mmHg (12/25 0421) SpO2:  [90 %-92 %] 92 % (12/25 0508) Weight:  [200 lb 4.8 oz (90.855 kg)] 200 lb 4.8 oz (90.855 kg) (12/24 1040) FIO2  3lpm NP   PHYSICAL EXAMINATION: General:  wdwn adult male chronically ill appearing, no increased wob on nasal 02    HEENT:  Mm pink/moist, no JVD Cardiovascular:  s1s2 rrr, distant tones, pulses equal bilaterally Lungs:  mildly labored but no distress, junky insp and exp ronchi bilaterally Abdomen:  Obese, soft, bsx4 active  Musculoskeletal:  No acute deformities  Skin:  Warm/dry, no edema, no lesions / open wounds   Recent Labs Lab 04/26/14 0542 04/27/14 0410 04/28/14 0415  NA 135 133* 129*  K 4.0 4.3 4.9  CL 102 101 97  CO2 27 27 27   BUN 13 14 16   CREATININE 1.29 1.34 1.32  GLUCOSE 99 106* 123*    Recent Labs Lab 04/26/14 0542 04/27/14 0410 04/28/14 0415  HGB 10.1* 9.4* 8.8*  HCT 31.1* 28.9* 27.5*  WBC 4.8  5.1 6.1  PLT 252 260 263   Dg Chest 2 View  04/27/2014   CLINICAL DATA:  Cough.  History of asbestosis.  EXAM: CHEST  2 VIEW  COMPARISON:  04/24/2014 .  FINDINGS: Mediastinum fullness again noted consistent previously identified adenopathy . Right lower lobe infiltrate suggesting pneumonia . Bilateral severe pleural parenchymal thickening noted consistent with scarring. Calcified bilateral pleural plaques noted. Prior asbestos exposure can present in this fashion. Stable cardiomegaly. No acute bony abnormality. Mild bowel distention. Abdominal series can be obtained to further evaluate.  IMPRESSION: 1. Right lower lobe infiltrate consistent with pneumonia. 2. Severe bilateral pleural parenchymal thickening with calcified pleural plaques. These changes are consistent with scarring and prior asbestos exposure. 3. Stable mediastinal fullness consistent with known adenopathy. 4. Cardiomegaly.  No pulmonary venous congestion. 5. Bowel distention. Abdominal series can be obtained to further evaluate.   Electronically Signed   By: [place]   On: 04/27/2014 10:10   Dg Esophagus  04/27/2014   CLINICAL DATA:  Cough, query tracheal aspiration.  EXAM: ESOPHOGRAM/BARIUM SWALLOW  TECHNIQUE: Single contrast examination was performed using thin barium and thick barium.  FLUOROSCOPY TIME:  0 min, 29 seconds  COMPARISON:  04/24/2014 chest CT  FINDINGS: On initial swallows with thick barium, barium extended into the larynx to the cords. With thin barium administration, barium was observed to extend  into the upper trachea. This did not elicit a cough although the patient did periodically exhibit a wet hacking cough unrelated to swallows. Out of concern that the patient would aspirate further during the repeated and complex swallows required for diagnostic esophagram, the exam was discontinued at this point.  IMPRESSION: 1. Extensive laryngeal penetration to the cords with thick barium. Tracheal aspiration with thin  barium. The exam was discontinued after assessment of the pharyngeal phase due to risk of barium aspiration. Considered speech pathology assessment.   Electronically Signed   By: Sherryl Barters M.D.   On: 04/27/2014 13:07    ASSESSMENT / PLAN:  HCAP - new development of RLL infiltrate overnight 12/24 which was not on CTA from admission.  Given patients GI hx with prior Nissen (1994), reflux with aspiration is likely the reason for change in CXR and development of new fever. Fever  COPD with Chronic Bronchitis Asbestosis - followed by Dr. Annamaria Boots Former Smoker - up to 2ppd for 50 years, quit in 2007   Plan: Changed to zosyn 12/25 for asp/ hcap so continue vancomycin Pulmonary hygiene:  IS, mobilize as able  Aspiration precautions Duoneb TID with Q3 PRN albuterol  Tirtrate  oxygen for sats > 90% NPO - discussed with nursing     Christinia Gully, MD Pulmonary and [place] [phone] After 5:30 PM or weekends, call [phone]

## 2014-04-28 NOTE — Progress Notes (Signed)
ANTIBIOTIC CONSULT NOTE - Follow up  Pharmacy Consult for vancomycin and Zosyn Indication: HCAP vs aspiration pneumonia  Allergies  Allergen Reactions  . Codeine Itching    Patient Measurements: Height: 6' (182.9 cm) Weight: 200 lb 4.8 oz (90.855 kg) IBW/kg (Calculated) : 77.6   Assessment: 87 yoM admitted 12/21 with c/o SOB, cough and congestion. Noted patient with recent hospitalization 12/14-12/17 for cardiac work-up due to SOB. Pharmacy was originally consulted to dose cefepime and vancomycin for HCAP, cefepime is now being transitioned to Zosyn per Pharmacy with concerns of aspiration pneumonia.   Anti-infectives: 12/21 >> zosyn >> x1 ED 12/21 >> vancomycin >>  12/22>> cefepime >> 12/25 12/25 >> Zosyn >>  Labs / vitals Tmax: spike to 103 on 12/24, now AF WBCs: low on admission, now WNL Renal: SCr 1.32 +/- bump, stable now.  Microbiology  12/21 Blood x2:NGTD 12/22 S. pneumo and legionella U Ag: both Neg 12/24 Blood x2: collected 12/24 Urine: collected  Goal of Therapy:  Vancomycin trough level 15-20 mcg/ml  Appropriate antibiotic dosing for indication and renal function; eradication of infection.   Plan:  1. Continue vancomycin to 500 mg IV q12h 2. Discontinue cefepime 3. Start Zosyn 3.375gm IV now, 1st dose given over 30 min 4. Start Zosyn 3.375G IV q8h today at 1600, each dose to be infused over 4 hours 5. consider vancomycin trough over the weekend 6. folllow-up clinical course, culture results, renal function 7. follow-up antibiotic de-escalation and length of therapy  Thank you for the consult.  Currie Paris, PharmD, BCPS Pager: [phone] Pharmacy: [phone] 04/28/2014 10:23 AM

## 2014-04-28 NOTE — Progress Notes (Addendum)
Patient ID: Curtis [name], male   DOB: [birth year], 68 y.o.   MRN: [id number]  [facility] PROGRESS NOTE  Curtis [name] OYD:[id number] DOB: Mar 02, 1946 DOA: 04/24/2014 PCP: Curtis Seller, MD  Brief narrative: 68 y.o. male with CAD, NSTEMI back in Sept, got a stent put in his circumflex at that time, see Dr. Thurman [name] H+P done last week for cardiac details. Was admitted to Cardiology service last week due to SOB, work up that time including stress test which was reassuring. Patient presented to the ED with c/o SOB, cough, congestion, cough productive of yellowish sputum.   Work up in ED shows findings suspicious for bilateral PNA in BUL on CT scan, not seen on CXR.  Assessment and Plan:    Principal Problem:  Acute hypoxic respiratory failure, secondary to ? HCAP/aspiration  - thought that spike in fever from aspiration PNA - will continue broad spectrum ABX Vanc day #5 for now, maxipime completed for 4 days, Zosyn day #2 - provide oxygen, BD' scheduled and as needed  - SLP requested, MBS, keep pt NPO for now - appreciate PCCM input   Increased prominence of mediastinal and bilateral hilar lymphadenopathy - per PCCM  Active Problems:  Anemia of chronic disease, iron def - no signs of active bleeding  HTN - stable this AM - hold Norvasc but continue metoprolol for now - may need to hold if SBP < 90  HLD - continue statin   GERD - stable, continue Protonix   CAD (coronary artery disease), native coronary artery - continue aspirin and plavix   Severe PCM - in the context of acute illness, aspiration risk - NPO for now   Presence of drug coated stent in left circumflex coronary artery: Promus DES 3.5 mm x 38 mm (3.9 mm)  DVT prophylaxis  Heparin SQ while pt is in hospital  Code Status: Full Family Communication: Pt at bedside Disposition Plan: Home when medically stable  IV Access:    Peripheral IV Procedures and diagnostic studies:     Dg Chest 2 View 04/24/2014 New trace pleural effusions with a few Kerley B-lines likely indicating developing interstitial edema.   Ct Angio Chest Pe W/cm &/or Wo Cm 04/24/2014 1. No evidence of pulmonary embolus. 2. Mild patchy and nodular airspace opacities within the upper lobes bilaterally, concerning for an acute infectious process. 3. Diffuse asbestos-related pleural disease, with subpleural consolidation and plaques, and scattered calcification, similar in appearance to 2014. No new masses seen. 4. Increased prominence of mediastinal and bilateral hilar lymphadenopathy. This could reflect a systemic conditions such as sarcoidosis; lymphoma or metastatic disease cannot be excluded. Several of these nodes would be amenable to biopsy. Medical Consultants:    PCCM Other Consultants:    Physical therapy  Anti-Infectives:    Vancomycin 12.21 -->  Maxipime 12/21 --> 12/24  Zosyn 12/24 -->  Curtis Ramsay, MD  [facility] Pager [phone]  If 7PM-7AM, please contact night-coverage [email or web address] Password [facility] 04/28/2014, 7:26 PM   LOS: 4 days   HPI/Subjective: No events overnight.   Objective: Filed Vitals:   04/28/14 0459 04/28/14 0508 04/28/14 1300 04/28/14 1458  BP:   121/50   Pulse:   76   Temp:   99 F (37.2 C)   TempSrc:   Oral   Resp:   14   Height:      Weight:      SpO2: 90% 92% 92% 92%    Intake/Output Summary (Last 24 hours) at 04/28/14 1926 Last data filed  at 04/28/14 1759  Gross per 24 hour  Intake      0 ml  Output   2175 ml  Net  -2175 ml    Exam:   General:  Pt is somewhat somnolent this AM but can be aroused, follows commands appropriately, NAD  Cardiovascular: Regular rate and rhythm, no rubs, no gallops  Respiratory: Clear to auscultation bilaterally, no wheezing, bibasilar rales   Abdomen: Soft, non tender, non distended, bowel sounds present, no guarding   Data Reviewed: Basic Metabolic Panel:  Recent Labs Lab  04/24/14 1819 04/26/14 0542 04/27/14 0410 04/28/14 0415  NA 136* 135 133* 129*  K 3.9 4.0 4.3 4.9  CL 98 102 101 97  CO2 24 27 27 27   GLUCOSE 107* 99 106* 123*  BUN 13 13 14 16   CREATININE 1.27 1.29 1.34 1.32  CALCIUM 9.6 9.1 8.8 8.4   Liver Function Tests:  Recent Labs Lab 04/24/14 1819  AST 19  ALT 18  ALKPHOS 105  BILITOT 0.3  PROT 7.6  ALBUMIN 3.8   CBC:  Recent Labs Lab 04/24/14 1819 04/26/14 0542 04/27/14 0410 04/28/14 0415  WBC 3.9* 4.8 5.1 6.1  NEUTROABS 2.4  --   --   --   HGB 10.1* 10.1* 9.4* 8.8*  HCT 32.3* 31.1* 28.9* 27.5*  MCV 90.5 90.1 89.5 89.0  PLT 284 252 260 263   Cardiac Enzymes:  Recent Labs Lab 04/24/14 1819  TROPONINI <0.30    Recent Results (from the past 240 hour(s))  Culture, blood (routine x 2)     Status: None (Preliminary result)   Collection Time: 04/24/14 10:53 PM  Result Value Ref Range Status   Specimen Description BLOOD LEFT ARM  Final   Special Requests BOTTLES DRAWN AEROBIC ONLY 5CC  Final   Culture  Setup Time   Final    04/25/2014 04:47 Performed at [facility]    Culture   Final           BLOOD CULTURE RECEIVED NO GROWTH TO DATE CULTURE WILL BE HELD FOR 5 DAYS BEFORE ISSUING A FINAL NEGATIVE REPORT Performed at [facility]    Report Status PENDING  Incomplete  Culture, blood (routine x 2)     Status: None (Preliminary result)   Collection Time: 04/24/14 10:53 PM  Result Value Ref Range Status   Specimen Description BLOOD RIGHT ARM  Final   Special Requests BOTTLES DRAWN AEROBIC AND ANAEROBIC 5CC  Final   Culture  Setup Time   Final    04/25/2014 04:47 Performed at [facility]    Culture   Final           BLOOD CULTURE RECEIVED NO GROWTH TO DATE CULTURE WILL BE HELD FOR 5 DAYS BEFORE ISSUING A FINAL NEGATIVE REPORT Performed at [facility]    Report Status PENDING  Incomplete  Culture, blood (routine x 2)     Status: None (Preliminary result)   Collection Time:  04/27/14 10:10 AM  Result Value Ref Range Status   Specimen Description BLOOD LEFT ARM  Final   Special Requests BOTTLES DRAWN AEROBIC AND ANAEROBIC 5CC  Final   Culture   Final           BLOOD CULTURE RECEIVED NO GROWTH TO DATE CULTURE WILL BE HELD FOR 5 DAYS BEFORE ISSUING A FINAL NEGATIVE REPORT Performed at [facility]    Report Status PENDING  Incomplete  Culture, blood (routine x 2)  Status: None (Preliminary result)   Collection Time: 04/27/14 10:16 AM  Result Value Ref Range Status   Specimen Description BLOOD RIGHT HAND  Final   Special Requests BOTTLES DRAWN AEROBIC ONLY 5CC  Final   Culture   Final           BLOOD CULTURE RECEIVED NO GROWTH TO DATE CULTURE WILL BE HELD FOR 5 DAYS BEFORE ISSUING A FINAL NEGATIVE REPORT Performed at [facility]    Report Status PENDING  Incomplete  Culture, Urine     Status: None   Collection Time: 04/27/14 10:50 AM  Result Value Ref Range Status   Specimen Description URINE, RANDOM  Final   Special Requests NONE  Final   Colony Count NO GROWTH Performed at [facility]   Final   Culture NO GROWTH Performed at [facility]   Final   Report Status 04/28/2014 FINAL  Final     Scheduled Meds: . alprazolam  2 mg Oral BID  . amitriptyline  75 mg Oral QHS  . aspirin  81 mg Oral Daily  . atorvastatin  40 mg Oral q1800  . clopidogrel  75 mg Oral Daily  . finasteride  5 mg Oral Daily  . gabapentin  300 mg Oral TID  . heparin  5,000 Units Subcutaneous 3 times per day  . ipratropium-albuterol  3 mL Nebulization TID  . lubiprostone  24 mcg Oral BID WC  . metoprolol  25 mg Oral BID  . pantoprazole  80 mg Oral Daily  . piperacillin-tazobactam (ZOSYN)  IV  3.375 g Intravenous Q8H  . polyethylene glycol  17 g Oral Daily  . senna-docusate  1 tablet Oral BID  . tamsulosin  0.4 mg Oral QHS  . vancomycin  500 mg Intravenous Q12H   Continuous Infusions:

## 2014-04-29 ENCOUNTER — Inpatient Hospital Stay (HOSPITAL_COMMUNITY): Payer: Medicare Other

## 2014-04-29 DIAGNOSIS — T17998D Other foreign object in respiratory tract, part unspecified causing other injury, subsequent encounter: Secondary | ICD-10-CM

## 2014-04-29 DIAGNOSIS — R0602 Shortness of breath: Secondary | ICD-10-CM

## 2014-04-29 LAB — BASIC METABOLIC PANEL
Anion gap: 6 (ref 5–15)
BUN: 17 mg/dL (ref 6–23)
CALCIUM: 8.5 mg/dL (ref 8.4–10.5)
CO2: 26 mmol/L (ref 19–32)
Chloride: 97 mEq/L (ref 96–112)
Creatinine, Ser: 1.23 mg/dL (ref 0.50–1.35)
GFR calc non Af Amer: 58 mL/min — ABNORMAL LOW (ref >90)
GFR, EST AFRICAN AMERICAN: 67 mL/min — AB (ref >90)
GLUCOSE: 91 mg/dL (ref 70–99)
Potassium: 4.7 mmol/L (ref 3.5–5.1)
SODIUM: 129 mmol/L — AB (ref 135–145)

## 2014-04-29 LAB — CBC
HCT: 28.8 % — ABNORMAL LOW (ref 39.0–52.0)
HEMOGLOBIN: 9.1 g/dL — AB (ref 13.0–17.0)
MCH: 28.4 pg (ref 26.0–34.0)
MCHC: 31.6 g/dL (ref 30.0–36.0)
MCV: 90 fL (ref 78.0–100.0)
Platelets: 241 10*3/uL (ref 150–400)
RBC: 3.2 MIL/uL — ABNORMAL LOW (ref 4.22–5.81)
RDW: 15 % (ref 11.5–15.5)
WBC: 4.8 10*3/uL (ref 4.0–10.5)

## 2014-04-29 MED ORDER — AMITRIPTYLINE HCL 75 MG PO TABS
75.0000 mg | ORAL_TABLET | Freq: Once | ORAL | Status: AC
  Administered 2014-04-30: 75 mg via ORAL
  Filled 2014-04-29 (×2): qty 1

## 2014-04-29 MED ORDER — PANTOPRAZOLE SODIUM 40 MG IV SOLR
40.0000 mg | Freq: Two times a day (BID) | INTRAVENOUS | Status: DC
  Administered 2014-04-29 – 2014-05-02 (×7): 40 mg via INTRAVENOUS
  Filled 2014-04-29 (×9): qty 40

## 2014-04-29 NOTE — Plan of Care (Signed)
Problem: Phase I Progression Outcomes Goal: OOB as tolerated unless otherwise ordered Outcome: Not Progressing Pt refused earlier in shift. Having multiple loose BM's. Son at bedside.

## 2014-04-29 NOTE — Progress Notes (Signed)
PT Cancellation Note  Patient Details Name: Curtis [name] MRN: [id number] DOB: Aug 07, 1945   Cancelled Treatment:     Attempted to see pt twice today (once down for MBS) and then in room getting cleaned up with CNA and soon and we were asked to come back later. Did let nurse know and nurse aware to try to get pt to chair and encourage OOB today. Pt did walk with PT on Wed, but would like to see him tomorrow to do so again.    Clide Dales 04/29/2014, 4:16 PM Clide Dales, PT Pager: [phone] 04/29/2014

## 2014-04-29 NOTE — Progress Notes (Addendum)
Patient ID: Curtis [name], male   DOB: [birth year], 68 y.o.   MRN: [id number]  [facility] PROGRESS NOTE  GURPREET MIKHAIL WUJ:[id number] DOB: [birth year] DOA: 04/24/2014 PCP: [name], MD  Brief narrative: 68 y.o. male with CAD, NSTEMI back in Sept, got a stent put in his circumflex at that time, see Dr. Thurman Coyer H+P done last week for cardiac details. Was admitted to Cardiology service last week due to SOB, work up that time including stress test which was reassuring. Patient presented to the ED with c/o SOB, cough, congestion, cough productive of yellowish sputum.   Work up in ED shows findings suspicious for bilateral PNA in BUL on CT scan, not seen on CXR.  Assessment and Plan:    Principal Problem:  Acute hypoxic respiratory failure, ? HCAP/aspiration (pt with known dx of COPD/asbestosis in 2005 by Dr. Annamaria Boots) - thought that spike in fever from aspiration PNA - will continue broad spectrum ABX Vanc day #6 for now, maxipime completed for 4 days, Zosyn day #3 - provide oxygen, BD' scheduled and as needed  - SLP requested, MBS today, keep NPO including meds  - appreciate PCCM input   Increased prominence of mediastinal and bilateral hilar lymphadenopathy - per PCCM  Active Problems:  Anemia of chronic disease, iron def - no signs of active bleeding, Hg stable 8 - 9   Hyponatremia - likely pre renal as pt still NPO due to aspiration risk - gently hydration with NS at 50 cc/hr x 8 hours   HTN - stable this AM - hold Norvasc but continue metoprolol for now - may need to hold if SBP < 90  HLD - continue statin   GERD - stable, continue Protonix   CAD (coronary artery disease), native coronary artery - continue aspirin and plavix  Severe PCM - in the context of acute illness, high aspiration risk - NPO for now  - MBS this AM  Presence of drug coated stent in left circumflex coronary artery: Promus DES 3.5 mm x 38 mm (3.9 mm) - on aspirin  and plavix but have to hold for now until MBS done   DVT prophylaxis  Heparin SQ while pt is in hospital  Code Status: DNR Family Communication: Pt at bedside, son over the phone  Disposition Plan: Home vs SNF when medically stable  IV Access:    Peripheral IV Procedures and diagnostic studies:    Dg Chest 2 View 04/24/2014 New trace pleural effusions with a few Kerley B-lines likely indicating developing interstitial edema.   Ct Angio Chest Pe W/cm &/or Wo Cm 04/24/2014 1. No evidence of pulmonary embolus. 2. Mild patchy and nodular airspace opacities within the upper lobes bilaterally, concerning for an acute infectious process. 3. Diffuse asbestos-related pleural disease, with subpleural consolidation and plaques, and scattered calcification, similar in appearance to 2014. No new masses seen. 4. Increased prominence of mediastinal and bilateral hilar lymphadenopathy. This could reflect a systemic conditions such as sarcoidosis; lymphoma or metastatic disease cannot be excluded. Several of these nodes would be amenable to biopsy. Medical Consultants:    PCCM Other Consultants:    Physical therapy  Anti-Infectives:    Vancomycin 12.21 -->  Maxipime 12/21 --> 12/24  Zosyn 12/24 -->  [name], MD  [facility] Pager [phone]  If 7PM-7AM, please contact night-coverage [email or web address] Password [email or web address] 04/29/2014, 10:15 AM   LOS: 5 days   HPI/Subjective: No events overnight.   Objective: Filed Vitals:   04/28/14 2130 04/29/14 0505 04/29/14 0602  04/29/14 0748  BP:  133/58    Pulse:  86    Temp:  99.3 F (37.4 C)    TempSrc:  Oral    Resp:  20    Height:      Weight:      SpO2: 93% 95% 94% 95%    Intake/Output Summary (Last 24 hours) at 04/29/14 1015 Last data filed at 04/29/14 0944  Gross per 24 hour  Intake      0 ml  Output   2650 ml  Net  -2650 ml    Exam:   General:  Pt is alert, follows commands appropriately, not in  acute distress  Cardiovascular: Regular rate and rhythm, S1/S2, no rubs, no gallops  Respiratory: Bilateral rales, coughing but weak and unable to clear secretions   Abdomen: Soft, non tender, non distended, bowel sounds present, no guarding  Extremities: pulses DP and PT palpable bilaterally  Neuro: Grossly nonfocal  Data Reviewed: Basic Metabolic Panel:  Recent Labs Lab 04/24/14 1819 04/26/14 0542 04/27/14 0410 04/28/14 0415 04/29/14 0544  NA 136* 135 133* 129* 129*  K 3.9 4.0 4.3 4.9 4.7  CL 98 102 101 97 97  CO2 24 27 27 27 26   GLUCOSE 107* 99 106* 123* 91  BUN 13 13 14 16 17   CREATININE 1.27 1.29 1.34 1.32 1.23  CALCIUM 9.6 9.1 8.8 8.4 8.5   Liver Function Tests:  Recent Labs Lab 04/24/14 1819  AST 19  ALT 18  ALKPHOS 105  BILITOT 0.3  PROT 7.6  ALBUMIN 3.8   CBC:  Recent Labs Lab 04/24/14 1819 04/26/14 0542 04/27/14 0410 04/28/14 0415 04/29/14 0544  WBC 3.9* 4.8 5.1 6.1 4.8  NEUTROABS 2.4  --   --   --   --   HGB 10.1* 10.1* 9.4* 8.8* 9.1*  HCT 32.3* 31.1* 28.9* 27.5* 28.8*  MCV 90.5 90.1 89.5 89.0 90.0  PLT 284 252 260 263 241   Cardiac Enzymes:  Recent Labs Lab 04/24/14 1819  TROPONINI <0.30   Recent Results (from the past 240 hour(s))  Culture, blood (routine x 2)     Status: None (Preliminary result)   Collection Time: 04/24/14 10:53 PM  Result Value Ref Range Status   Specimen Description BLOOD LEFT ARM  Final   Special Requests BOTTLES DRAWN AEROBIC ONLY 5CC  Final   Culture  Setup Time   Final    04/25/2014 04:47 Performed at [facility]    Culture   Final           BLOOD CULTURE RECEIVED NO GROWTH TO DATE CULTURE WILL BE HELD FOR 5 DAYS BEFORE ISSUING A FINAL NEGATIVE REPORT Performed at [facility]    Report Status PENDING  Incomplete  Culture, blood (routine x 2)     Status: None (Preliminary result)   Collection Time: 04/24/14 10:53 PM  Result Value Ref Range Status   Specimen Description BLOOD  RIGHT ARM  Final   Special Requests BOTTLES DRAWN AEROBIC AND ANAEROBIC 5CC  Final   Culture  Setup Time   Final    04/25/2014 04:47 Performed at [facility]    Culture   Final           BLOOD CULTURE RECEIVED NO GROWTH TO DATE CULTURE WILL BE HELD FOR 5 DAYS BEFORE ISSUING A FINAL NEGATIVE REPORT Performed at [facility]    Report Status PENDING  Incomplete  Culture, blood (routine x 2)  Status: None (Preliminary result)   Collection Time: 04/27/14 10:10 AM  Result Value Ref Range Status   Specimen Description BLOOD LEFT ARM  Final   Special Requests BOTTLES DRAWN AEROBIC AND ANAEROBIC 5CC  Final   Culture   Final           BLOOD CULTURE RECEIVED NO GROWTH TO DATE CULTURE WILL BE HELD FOR 5 DAYS BEFORE ISSUING A FINAL NEGATIVE REPORT Performed at [facility]    Report Status PENDING  Incomplete  Culture, blood (routine x 2)     Status: None (Preliminary result)   Collection Time: 04/27/14 10:16 AM  Result Value Ref Range Status   Specimen Description BLOOD RIGHT HAND  Final   Special Requests BOTTLES DRAWN AEROBIC ONLY 5CC  Final   Culture   Final           BLOOD CULTURE RECEIVED NO GROWTH TO DATE CULTURE WILL BE HELD FOR 5 DAYS BEFORE ISSUING A FINAL NEGATIVE REPORT Performed at [facility]    Report Status PENDING  Incomplete  Culture, Urine     Status: None   Collection Time: 04/27/14 10:50 AM  Result Value Ref Range Status   Specimen Description URINE, RANDOM  Final   Special Requests NONE  Final   Colony Count NO GROWTH Performed at [facility]   Final   Culture NO GROWTH Performed at [facility]   Final   Report Status 04/28/2014 FINAL  Final     Scheduled Meds: . alprazolam  2 mg Oral BID  . aspirin  81 mg Oral Daily  . clopidogrel  75 mg Oral Daily  . finasteride  5 mg Oral Daily  . heparin  5,000 Units Subcutaneous 3 times per day  . ipratropium-albuterol  3 mL Nebulization TID  . lubiprostone  24  mcg Oral BID WC  . metoprolol  25 mg Oral BID  . pantoprazole (PROTONIX) IV  40 mg Intravenous Q12H  . piperacillin-tazobactam (ZOSYN)  IV  3.375 g Intravenous Q8H  . polyethylene glycol  17 g Oral Daily  . senna-docusate  1 tablet Oral BID  . tamsulosin  0.4 mg Oral QHS  . vancomycin  500 mg Intravenous Q12H   Continuous Infusions:

## 2014-04-29 NOTE — Progress Notes (Signed)
Code status changed to DNR. Discussed with son and pt himself. Pt very clear he does not want intubation of chest compression under any circumstance.   [name], MD  [facility] Pager [phone]  If 7PM-7AM, please contact night-coverage [email or web address] Password [email or web address]

## 2014-04-29 NOTE — Progress Notes (Signed)
Name: [name] [name] MRN: [id number] DOB: [birth year]    ADMISSION DATE:  04/24/2014 CONSULTATION DATE:  04/27/14  REFERRING MD :  [name]   CHIEF COMPLAINT:  Fever, PNA, Abnormal CT Chest   BRIEF PATIENT DESCRIPTION: 55 yowm with fever and asd c/w CAP but high suspicion Asp   SIGNIFICANT EVENTS  12/21  Admit with worsening shortness of breath, cough.  CTA with concern for nodular upper lobe airspace disease  12/23  Fever of 103 despite abx, PCCM consulted for evaluation  12/24 Dg Es > frank asp/ procedure terminate 12/25 > npo 12/26 > ST rec Diet Recommendation Regular;Thin liquid   Liquid Administration via: Straw;Cup Medication Administration: Whole meds with puree Compensations: Follow solids with liquid Postural Changes and/or Swallow Maneuvers: Seated upright 90  degrees;Upright 30-60 min after meal     STUDIES:  12/21  CTA Chest >> neg for PE, patchy bilateral nodular upper lobe airspace dz, diffuse asbestos related pleural disease, increased mediastinal / bilateral hilar LAN 12/24  2V CXR >> RLL infiltrate   SUBJECTIVE:  ? Slowed mentation/ poor cough mechanics/ gurgling when asked to cough   VITAL SIGNS: Temp:  [98 F (36.7 C)-99.3 F (37.4 C)] 98 F (36.7 C) (12/26 1427) Pulse Rate:  [82-94] 90 (12/26 1427) Resp:  [20] 20 (12/26 1427) BP: (130-156)/(58-68) 156/68 mmHg (12/26 1427) SpO2:  [93 %-95 %] 93 % (12/26 1427) FIO2  3lpm NP   PHYSICAL EXAMINATION: General:  wdwn adult male chronically ill appearing, no increased wob on nasal 02    HEENT:  Mm pink/moist, no JVD Cardiovascular:  s1s2 rrr, distant tones, pulses equal bilaterally Lungs: but much more clear today/ minimal exp rhonchi   Abdomen:  Obese, soft, bsx4 active  Musculoskeletal:  No acute deformities  Skin:  Warm/dry, no edema, no lesions / open wounds   Recent Labs Lab 04/27/14 0410 04/28/14 0415 04/29/14 0544  NA 133* 129* 129*  K 4.3 4.9 4.7  CL 101 97 97  CO2 27 27 26   BUN 14 16  17   CREATININE 1.34 1.32 1.23  GLUCOSE 106* 123* 91    Recent Labs Lab 04/27/14 0410 04/28/14 0415 04/29/14 0544  HGB 9.4* 8.8* 9.1*  HCT 28.9* 27.5* 28.8*  WBC 5.1 6.1 4.8  PLT 260 263 241   Dg Chest 2 View  04/28/2014   CLINICAL DATA:  Pneumonia, shortness of breath, followup, history hypertension, coronary disease post MI, asbestosis  EXAM: CHEST  2 VIEW  COMPARISON:  04/27/2014  FINDINGS: Retained contrast in stomach.  Enlargement of cardiac silhouette.  Mediastinal contours and pulmonary vascularity normal.  Transverse narrowing of trachea in the inferior cervical region unchanged.  Peripheral pleural thickening in calcified pleural plaques and subpleural interstitial changes consistent with history of asbestosis.  No definite acute segmental infiltrate or pneumothorax.  Bones demineralized.  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  Mild enlargement of cardiac silhouette.  No acute abnormalities.   Electronically Signed   By: Lavonia Dana M.D.   On: 04/28/2014 12:53   Dg Swallowing Func-speech Pathology  04/29/2014   [name] [name], CCC-SLP     Diet Recommendation Regular;Thin liquid   Liquid Administration via: Straw;Cup Medication Administration: Whole meds with puree Compensations: Follow solids with liquid Postural Changes and/or Swallow Maneuvers: Seated upright 90  degrees;Upright 30-60 min after meal     ASSESSMENT / PLAN:  HCAP - new development of RLL infiltrate overnight 12/24 which was not on CTA from admission.  Given patients GI hx with prior Nissen (1994), reflux with aspiration is likely the reason for change in CXR and development of new fever. 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

## 2014-04-29 NOTE — Procedures (Signed)
Objective Swallowing Evaluation: Modified Barium Swallowing Study  Patient Details  Name: Curtis [name] MRN: [id number] Date of Birth: Jul 12, 1945  Today's Date: 04/29/2014 Time: 1015-1100 SLP Time Calculation (min) (ACUTE ONLY): 45 min  Past Medical History:  Past Medical History  Diagnosis Date  . Arthritis   . SOB (shortness of breath)   . Pulmonary asbestosis   . Hernia of unspecified site of abdominal cavity without mention of obstruction or gangrene     hiatal  . GERD (gastroesophageal reflux disease)   . Depression   . Diverticulitis     hospital 2011 [facility]  . Diverticulosis   . Reflex sympathetic dystrophy   . 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 Hypertension   . IBS (irritable bowel syndrome)   . Peripheral vascular disease     ankle brachial index of 0.79 on the right and 0.61 on the left.   . Myocardial infarction 01/2014    NSTEMI  . Hyperlipidemia LDL goal <70 04/20/2014  . AAA (abdominal aortic aneurysm), stable 04/20/2014   Past Surgical History:  Past Surgical History  Procedure Laterality Date  . Nissen fundoplication    . Right elbow surgery      x 2  . Right knee arthroscopy    . Left shoulder       x 3  . Squamous cell skin cancer      Left Hand  . Cataract extraction    . Supraventricular tachycardia ablation N/A 01/16/2014    Procedure: SUPRAVENTRICULAR TACHYCARDIA ABLATION;  Surgeon: Evans Lance, MD;  Location: [facility] CATH LAB;  Service: Cardiovascular;  Laterality: N/A;  . Left heart catheterization with coronary angiogram N/A 01/17/2014    Procedure: LEFT HEART CATHETERIZATION WITH CORONARY ANGIOGRAM;  Surgeon: Leonie Man, MD;  Location: [facility] CATH LAB;  Service: Cardiovascular;  Laterality: N/A;   HPI:  68 y.o. male with CAD, NSTEMI back in Sept, presented to the ED with c/o SOB, cough, congestion.  Dx acute hypoxic respiratory failure secondary to ?HCAP/aspiration. Pt has hx of GERD. Status post remote Nissen fundoplication of hiatal hernia ~ 1994. Pt reports  return of reflux symptoms in last three weeks, stating that if he eats or drinks more than small to moderate volumes, he gets reflux with liquid regurgitating into his mouth.  Given concerns for esophageal issues as source of pna, esophagram was ordered 12/24; however, it was terminated due to laryngeal penetration to cords thick barium, aspiration thin barium. Orders received for MBS.       Assessment / Plan / Recommendation Clinical Impression  Dysphagia Diagnosis: Within Functional Limits  Clinical impression: Pt presents with a functional oropharyngeal swallow with sufficient mastication, mild delay in swallow initiation, but adequate hyolaryngeal mobility with reliable airway closure with all consistencies.  No penetration, no aspiration observed despite large, successive boluses.  These results are in contrast to noted aspiration during barium swallow on 12/24; however, during today's study pt was sitting at 90 degrees, which may contribute to improved performance (positioning during barium swallow studies is generally reclined.)  We discussed basic strategies to facilitate esophageal clearance and protect airway given hx.  Pt/son in agreement.  Pt verbalizing frustration re: symptoms and overall medical condition.  He stated several times that he was "ready to be with God."  Despite adequate performance on study, will f/u x 1 for symptom management/basic precautions.  Recommend allowing pt to resume a regular diet, thin liquids.      Treatment Recommendation  Therapy as outlined in treatment plan  below    Diet Recommendation Regular;Thin liquid   Liquid Administration via: Straw;Cup Medication Administration: Whole meds with puree Compensations: Follow solids with liquid Postural Changes and/or Swallow Maneuvers: Seated upright 90 degrees;Upright 30-60 min after meal    Other  Recommendations Recommended Consults: Consider GI evaluation (continue GI management) Oral Care Recommendations:  Oral care BID   Follow Up Recommendations  None    Frequency and Duration min 1 x/week  1 week       SLP Swallow Goals     General HPI: 68 y.o. male with CAD, NSTEMI back in Sept, presented to the ED with c/o SOB, cough, congestion.  Dx acute hypoxic respiratory failure secondary to ?HCAP/aspiration. Pt has hx of GERD. Status post remote Nissen fundoplication of hiatal hernia ~ 1994. Pt reports return of reflux symptoms in last three weeks, stating that if he eats or drinks more than small to moderate volumes, he gets reflux with liquid regurgitating into his mouth.  Given concerns for esophageal issues as source of pna, esophagram was ordered 12/24; however, it was terminated due to laryngeal penetration to cords thick barium, aspiration thin barium. Orders received for MBS.   Type of Study: Modified Barium Swallowing Study Reason for Referral: Objectively evaluate swallowing function Previous Swallow Assessment: none per records Diet Prior to this Study: NPO Temperature Spikes Noted: No Respiratory Status: Nasal cannula History of Recent Intubation: No Behavior/Cognition: Alert;Cooperative Oral Cavity - Dentition: Edentulous Oral Motor / Sensory Function: Within functional limits Self-Feeding Abilities: Able to feed self Patient Positioning: Upright in chair Baseline Vocal Quality: Hoarse Volitional Cough: Strong Volitional Swallow: Able to elicit Anatomy: Within functional limits    Reason for Referral Objectively evaluate swallowing function   Oral Phase Oral Preparation/Oral Phase Oral Phase: WFL   Pharyngeal Phase Pharyngeal Phase Pharyngeal Phase: Within functional limits  Cervical Esophageal Phase    GO    Cervical Esophageal Phase Cervical Esophageal Phase:  (screen of esophagus revealed retention barium distal esophag)        Evanell Redlich L. Tivis Ringer, [place] CCC/SLP Pager [phone]  Juan Quam Laurice 04/29/2014, 11:31 AM

## 2014-04-30 DIAGNOSIS — J9611 Chronic respiratory failure with hypoxia: Secondary | ICD-10-CM

## 2014-04-30 LAB — BASIC METABOLIC PANEL
ANION GAP: 4 — AB (ref 5–15)
BUN: 15 mg/dL (ref 6–23)
CALCIUM: 8.8 mg/dL (ref 8.4–10.5)
CO2: 30 mmol/L (ref 19–32)
CREATININE: 1 mg/dL (ref 0.50–1.35)
Chloride: 98 mEq/L (ref 96–112)
GFR calc Af Amer: 87 mL/min — ABNORMAL LOW (ref >90)
GFR calc non Af Amer: 75 mL/min — ABNORMAL LOW (ref >90)
Glucose, Bld: 120 mg/dL — ABNORMAL HIGH (ref 70–99)
Potassium: 4.1 mmol/L (ref 3.5–5.1)
SODIUM: 132 mmol/L — AB (ref 135–145)

## 2014-04-30 LAB — CBC
HCT: 26.8 % — ABNORMAL LOW (ref 39.0–52.0)
Hemoglobin: 8.7 g/dL — ABNORMAL LOW (ref 13.0–17.0)
MCH: 28.2 pg (ref 26.0–34.0)
MCHC: 32.5 g/dL (ref 30.0–36.0)
MCV: 87 fL (ref 78.0–100.0)
Platelets: 257 10*3/uL (ref 150–400)
RBC: 3.08 MIL/uL — ABNORMAL LOW (ref 4.22–5.81)
RDW: 14.8 % (ref 11.5–15.5)
WBC: 4.5 10*3/uL (ref 4.0–10.5)

## 2014-04-30 LAB — CLOSTRIDIUM DIFFICILE BY PCR: CDIFFPCR: NEGATIVE

## 2014-04-30 MED ORDER — ONDANSETRON HCL 4 MG/2ML IJ SOLN
4.0000 mg | Freq: Four times a day (QID) | INTRAMUSCULAR | Status: DC | PRN
  Administered 2014-04-30 – 2014-05-01 (×2): 4 mg via INTRAVENOUS
  Filled 2014-04-30 (×2): qty 2

## 2014-04-30 MED ORDER — GABAPENTIN 300 MG PO CAPS
300.0000 mg | ORAL_CAPSULE | Freq: Once | ORAL | Status: AC
  Administered 2014-04-30: 300 mg via ORAL
  Filled 2014-04-30: qty 1

## 2014-04-30 MED ORDER — AMITRIPTYLINE HCL 75 MG PO TABS
75.0000 mg | ORAL_TABLET | Freq: Once | ORAL | Status: AC
  Administered 2014-04-30: 75 mg via ORAL
  Filled 2014-04-30: qty 1

## 2014-04-30 NOTE — Progress Notes (Signed)
Pt got up out of bed confused and came out into the hall bleeding from his penis from where he has pulled out his IV and his foley catheter. MD notified. Pt alert to self but still somewhat confused; will notify son as pts request. Pt had taken off his oxygen, despite this his vital signs were stable after settling pt.

## 2014-04-30 NOTE — Progress Notes (Signed)
Name: Curtis [name] MRN: [id number] DOB: December 28, 1945    ADMISSION DATE:  04/24/2014 CONSULTATION DATE:  04/27/14  REFERRING MD :  [name]   CHIEF COMPLAINT:  Fever, PNA, Abnormal CT Chest   BRIEF PATIENT DESCRIPTION: 20 yowm with fever and asd c/w CAP but high suspicion Asp   SIGNIFICANT EVENTS  12/21  Admit with worsening shortness of breath, cough.  CTA with concern for nodular upper lobe airspace disease  12/23  Fever of 103 despite abx, PCCM consulted for evaluation  12/24 Dg Es > frank asp/ procedure terminate 12/25 > npo 12/26 > ST rec Diet Recommendation Regular;Thin liquid   Liquid Administration via: Straw;Cup Medication Administration: Whole meds with puree Compensations: Follow solids with liquid Postural Changes and/or Swallow Maneuvers: Seated upright 90  degrees;Upright 30-60 min after meal     STUDIES:  12/21  CTA Chest >> neg for PE, patchy bilateral nodular upper lobe airspace dz, diffuse asbestos related pleural disease, increased mediastinal / bilateral hilar LAN 12/24  2V CXR >> RLL infiltrate   SUBJECTIVE:  Comfortable at 30 degrees/ less congested coughing/ tol diet per ST recs ok so far   VITAL SIGNS: Temp:  [97.7 F (36.5 C)-98.4 F (36.9 C)] 97.7 F (36.5 C) (12/27 0526) Pulse Rate:  [85-94] 88 (12/27 0526) Resp:  [20] 20 (12/27 0526) BP: (140-163)/(65-86) 163/86 mmHg (12/27 0526) SpO2:  [92 %-97 %] 95 % (12/27 0526) FIO2  3lpm NP   PHYSICAL EXAMINATION: General:  wdwn adult male chronically ill appearing, no increased wob on nasal 02    HEENT:  Mm pink/moist, no JVD Cardiovascular:  s1s2 rrr, distant tones, pulses equal bilaterally Lungs: distant bs/ no wheeze Abdomen:  Obese, soft, bsx4 active  Musculoskeletal:  No acute deformities  Skin:  Warm/dry, no edema, no lesions / open wounds   Recent Labs Lab 04/28/14 0415 04/29/14 0544 04/30/14 0742  NA 129* 129* 132*  K 4.9 4.7 4.1  CL 97 97 98  CO2 27 26 30   BUN 16 17 15     CREATININE 1.32 1.23 1.00  GLUCOSE 123* 91 120*    Recent Labs Lab 04/28/14 0415 04/29/14 0544 04/30/14 0742  HGB 8.8* 9.1* 8.7*  HCT 27.5* 28.8* 26.8*  WBC 6.1 4.8 4.5  PLT 263 241 257     04/29/2014   Amanda Laurice Couture, CCC-SLP     Diet Recommendation Regular;Thin liquid   Liquid Administration via: Straw;Cup Medication Administration: Whole meds with puree Compensations: Follow solids with liquid Postural Changes and/or Swallow Maneuvers: Seated upright 90  degrees;Upright 30-60 min after meal     ASSESSMENT / PLAN:  HCAP - new development of RLL infiltrate overnight 12/24 which was not on CTA from admission.  Given patients GI hx with prior Nissen (1994), reflux with aspiration is likely the reason for change in CXR and development of new fever. 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

## 2014-04-30 NOTE — Progress Notes (Addendum)
Patient ID: [name], male   DOB: 1945-11-27, 68 y.o.   MRN: [id number]  [facility] PROGRESS NOTE  EUSEVIO SCHRIVER VVO:[id number] DOB: 09-Apr-1946 DOA: 04/24/2014 PCP: [name], MD   Brief narrative: 68 y.o. male with CAD, NSTEMI back in Sept, got a stent put in his circumflex at that time, see Dr. Thurman Coyer H+P done last week for cardiac details. Was admitted to Cardiology service last week due to SOB, work up that time including stress test which was reassuring. Patient presented to the ED with c/o SOB, cough, congestion, cough productive of yellowish sputum.   Work up in ED shows findings suspicious for bilateral PNA in BUL on CT scan, not seen on CXR.  Assessment and Plan:    Principal Problem:  Acute hypoxic respiratory failure, ? HCAP/aspiration (pt with known dx of COPD/asbestosis in 2005 by Dr. Annamaria Boots) - thought that spike in fever from aspiration PNA - will continue broad spectrum ABX Vanc day #7, maxipime completed for 4 days, Zosyn day #4 - stop Vanc today 12/27 - provide oxygen, BD' scheduled and as needed  - SLP requested, pt apparently not aspirating at this time and diet advanced to heart healthy  - appreciate PCCM input   Increased prominence of mediastinal and bilateral hilar lymphadenopathy - per PCCM  Active Problems:  Anemia of chronic disease, iron def - no signs of active bleeding, Hg stable 8 - 9  Hyponatremia - likely pre renal, now better and 132 this AM  HTN - stable this AM - continue to hold Norvasc but continue metoprolol for now  HLD - continue statin   GERD - stable, continue Protonix   CAD (coronary artery disease), native coronary artery - continue aspirin and plavix  Severe PCM - in the context of acute illness - not aspirating per SLP evaluation   Presence of drug coated stent in left circumflex coronary artery: Promus DES 3.5 mm x 38 mm (3.9 mm) - on aspirin and plavix but have to hold for now  until MBS done   DVT prophylaxis  Heparin SQ while pt is in hospital  Code Status: DNR Family Communication: Pt at bedside, son over the phone  Disposition Plan: Home vs SNF when medically stable  IV Access:    Peripheral IV Procedures and diagnostic studies:    Dg Chest 2 View 04/24/2014 New trace pleural effusions with a few Kerley B-lines likely indicating developing interstitial edema.  Ct Angio Chest Pe W/cm &/or Wo Cm 04/24/2014 1. No evidence of pulmonary embolus. 2. Mild patchy and nodular airspace opacities within the upper lobes bilaterally, concerning for an acute infectious process. 3. Diffuse asbestos-related pleural disease, with subpleural consolidation and plaques, and scattered calcification, similar in appearance to 2014. No new masses seen. 4. Increased prominence of mediastinal and bilateral hilar lymphadenopathy. This could reflect a systemic conditions such as sarcoidosis; lymphoma or metastatic disease cannot be excluded. Several of these nodes would be amenable to biopsy. Medical Consultants:    PCCM Other Consultants:    Physical therapy  Anti-Infectives:    Vancomycin 12.21 --> 12/27  Maxipime 12/21 --> 12/24  Zosyn 12/24 -->   [name], MD  [facility] Pager [phone]  If 7PM-7AM, please contact night-coverage [email or web address] Password [facility] 04/30/2014, 2:54 PM   LOS: 6 days   HPI/Subjective: No events overnight.   Objective: Filed Vitals:   04/29/14 1953 04/29/14 2200 04/30/14 0526 04/30/14 1136  BP:  143/65 163/86   Pulse:  85 88  Temp:  98.4 F (36.9 C) 97.7 F (36.5 C)   TempSrc:  Oral Oral   Resp:  20 20   Height:      Weight:      SpO2: 92% 97% 95% 93%    Intake/Output Summary (Last 24 hours) at 04/30/14 1454 Last data filed at 04/30/14 1313  Gross per 24 hour  Intake    240 ml  Output      0 ml  Net    240 ml    Exam:   General:  Pt is alert, follows commands appropriately, not  in acute distress  Cardiovascular: Regular rate and rhythm, S1/S2, no murmurs, no rubs, no gallops  Respiratory: Clear to auscultation bilaterally, minimal rhonchi at bases   Abdomen: Soft, non tender, non distended, bowel sounds present, no guarding   Data Reviewed: Basic Metabolic Panel:  Recent Labs Lab 04/26/14 0542 04/27/14 0410 04/28/14 0415 04/29/14 0544 04/30/14 0742  NA 135 133* 129* 129* 132*  K 4.0 4.3 4.9 4.7 4.1  CL 102 101 97 97 98  CO2 27 27 27 26 30   GLUCOSE 99 106* 123* 91 120*  BUN 13 14 16 17 15   CREATININE 1.29 1.34 1.32 1.23 1.00  CALCIUM 9.1 8.8 8.4 8.5 8.8   Liver Function Tests:  Recent Labs Lab 04/24/14 1819  AST 19  ALT 18  ALKPHOS 105  BILITOT 0.3  PROT 7.6  ALBUMIN 3.8   CBC:  Recent Labs Lab 04/24/14 1819 04/26/14 0542 04/27/14 0410 04/28/14 0415 04/29/14 0544 04/30/14 0742  WBC 3.9* 4.8 5.1 6.1 4.8 4.5  NEUTROABS 2.4  --   --   --   --   --   HGB 10.1* 10.1* 9.4* 8.8* 9.1* 8.7*  HCT 32.3* 31.1* 28.9* 27.5* 28.8* 26.8*  MCV 90.5 90.1 89.5 89.0 90.0 87.0  PLT 284 252 260 263 241 257   Cardiac Enzymes:  Recent Labs Lab 04/24/14 1819  TROPONINI <0.30    Recent Results (from the past 240 hour(s))  Culture, blood (routine x 2)     Status: None (Preliminary result)   Collection Time: 04/24/14 10:53 PM  Result Value Ref Range Status   Specimen Description BLOOD LEFT ARM  Final   Special Requests BOTTLES DRAWN AEROBIC ONLY 5CC  Final   Culture  Setup Time   Final    04/25/2014 04:47 Performed at [facility]    Culture   Final           BLOOD CULTURE RECEIVED NO GROWTH TO DATE CULTURE WILL BE HELD FOR 5 DAYS BEFORE ISSUING A FINAL NEGATIVE REPORT Performed at [facility]    Report Status PENDING  Incomplete  Culture, blood (routine x 2)     Status: None (Preliminary result)   Collection Time: 04/24/14 10:53 PM  Result Value Ref Range Status   Specimen Description BLOOD RIGHT ARM  Final   Special  Requests BOTTLES DRAWN AEROBIC AND ANAEROBIC 5CC  Final   Culture  Setup Time   Final    04/25/2014 04:47 Performed at [facility]    Culture   Final           BLOOD CULTURE RECEIVED NO GROWTH TO DATE CULTURE WILL BE HELD FOR 5 DAYS BEFORE ISSUING A FINAL NEGATIVE REPORT Performed at [facility]    Report Status PENDING  Incomplete  Culture, blood (routine x 2)     Status: None (Preliminary result)   Collection Time: 04/27/14  10:10 AM  Result Value Ref Range Status   Specimen Description BLOOD LEFT ARM  Final   Special Requests BOTTLES DRAWN AEROBIC AND ANAEROBIC 5CC  Final   Culture   Final           BLOOD CULTURE RECEIVED NO GROWTH TO DATE CULTURE WILL BE HELD FOR 5 DAYS BEFORE ISSUING A FINAL NEGATIVE REPORT Performed at [facility]    Report Status PENDING  Incomplete  Culture, blood (routine x 2)     Status: None (Preliminary result)   Collection Time: 04/27/14 10:16 AM  Result Value Ref Range Status   Specimen Description BLOOD RIGHT HAND  Final   Special Requests BOTTLES DRAWN AEROBIC ONLY 5CC  Final   Culture   Final           BLOOD CULTURE RECEIVED NO GROWTH TO DATE CULTURE WILL BE HELD FOR 5 DAYS BEFORE ISSUING A FINAL NEGATIVE REPORT Performed at [facility]    Report Status PENDING  Incomplete  Culture, Urine     Status: None   Collection Time: 04/27/14 10:50 AM  Result Value Ref Range Status   Specimen Description URINE, RANDOM  Final   Special Requests NONE  Final   Colony Count NO GROWTH Performed at [facility]   Final   Culture NO GROWTH Performed at [facility]   Final   Report Status 04/28/2014 FINAL  Final  Clostridium Difficile by PCR     Status: None   Collection Time: 04/30/14  1:32 AM  Result Value Ref Range Status   C difficile by pcr NEGATIVE NEGATIVE Final    Comment: Performed at [facility]     Scheduled Meds: . alprazolam  2 mg Oral BID  . aspirin  81 mg Oral Daily  .  clopidogrel  75 mg Oral Daily  . finasteride  5 mg Oral Daily  . heparin  5,000 Units Subcutaneous 3 times per day  . ipratropium-albuterol  3 mL Nebulization TID  . lubiprostone  24 mcg Oral BID WC  . metoprolol  25 mg Oral BID  . pantoprazole (PROTONIX) IV  40 mg Intravenous Q12H  . piperacillin-tazobactam (ZOSYN)  IV  3.375 g Intravenous Q8H  . polyethylene glycol  17 g Oral Daily  . senna-docusate  1 tablet Oral BID  . tamsulosin  0.4 mg Oral QHS  . vancomycin  500 mg Intravenous Q12H   Continuous Infusions:

## 2014-05-01 LAB — BASIC METABOLIC PANEL
ANION GAP: 3 — AB (ref 5–15)
BUN: 10 mg/dL (ref 6–23)
CO2: 30 mmol/L (ref 19–32)
Calcium: 8.3 mg/dL — ABNORMAL LOW (ref 8.4–10.5)
Chloride: 99 mEq/L (ref 96–112)
Creatinine, Ser: 1.03 mg/dL (ref 0.50–1.35)
GFR calc Af Amer: 84 mL/min — ABNORMAL LOW (ref >90)
GFR, EST NON AFRICAN AMERICAN: 72 mL/min — AB (ref >90)
Glucose, Bld: 113 mg/dL — ABNORMAL HIGH (ref 70–99)
Potassium: 3.9 mmol/L (ref 3.5–5.1)
SODIUM: 132 mmol/L — AB (ref 135–145)

## 2014-05-01 LAB — CBC
HCT: 24.9 % — ABNORMAL LOW (ref 39.0–52.0)
Hemoglobin: 7.9 g/dL — ABNORMAL LOW (ref 13.0–17.0)
MCH: 27.8 pg (ref 26.0–34.0)
MCHC: 31.7 g/dL (ref 30.0–36.0)
MCV: 87.7 fL (ref 78.0–100.0)
PLATELETS: 271 10*3/uL (ref 150–400)
RBC: 2.84 MIL/uL — ABNORMAL LOW (ref 4.22–5.81)
RDW: 14.8 % (ref 11.5–15.5)
WBC: 4.2 10*3/uL (ref 4.0–10.5)

## 2014-05-01 LAB — CULTURE, BLOOD (ROUTINE X 2)
CULTURE: NO GROWTH
Culture: NO GROWTH

## 2014-05-01 MED ORDER — GABAPENTIN 300 MG PO CAPS
300.0000 mg | ORAL_CAPSULE | Freq: Three times a day (TID) | ORAL | Status: DC
  Administered 2014-05-01 – 2014-05-02 (×3): 300 mg via ORAL
  Filled 2014-05-01 (×5): qty 1

## 2014-05-01 MED ORDER — AMITRIPTYLINE HCL 75 MG PO TABS
75.0000 mg | ORAL_TABLET | Freq: Every day | ORAL | Status: DC
  Administered 2014-05-01: 75 mg via ORAL
  Filled 2014-05-01 (×2): qty 1

## 2014-05-01 NOTE — Progress Notes (Signed)
   Name: Curtis [name] MRN: [id number] DOB: 03-12-1946    ADMISSION DATE:  04/24/2014 CONSULTATION DATE:  04/27/14  REFERRING MD :  [name]   CHIEF COMPLAINT:  Fever, PNA, Abnormal CT Chest   BRIEF PATIENT DESCRIPTION: 50 yowm with fever and asd c/w CAP but high suspicion Asp   SIGNIFICANT EVENTS  12/21  Admit with worsening shortness of breath, cough.  CTA with concern for nodular upper lobe airspace disease  12/23  Fever of 103 despite abx, PCCM consulted for evaluation  12/24 Dg Es > frank asp/ procedure terminate 12/25 > npo 12/26 > ST rec Diet Recommendation Regular;Thin liquid   Liquid Administration via: Straw;Cup Medication Administration: Whole meds with puree Compensations: Follow solids with liquid Postural Changes and/or Swallow Maneuvers: Seated upright 90  degrees;Upright 30-60 min after meal     STUDIES:  12/21  CTA Chest >> neg for PE, patchy bilateral nodular upper lobe airspace dz, diffuse asbestos related pleural disease, increased mediastinal / bilateral hilar LAN 12/24  2V CXR >> RLL infiltrate   SUBJECTIVE:  Comfortable, no events overnight.  VITAL SIGNS: Temp:  [98.1 F (36.7 C)-98.7 F (37.1 C)] 98.1 F (36.7 C) (12/28 0510) Pulse Rate:  [71-86] 78 (12/28 1026) Resp:  [18-20] 18 (12/28 0510) BP: (109-159)/(53-78) 132/60 mmHg (12/28 1026) SpO2:  [93 %-100 %] 95 % (12/28 0750) FIO2  3lpm NP  PHYSICAL EXAMINATION: General:  wdwn adult male chronically ill appearing, no increased wob on nasal 02  HEENT:  Mm pink/moist, no JVD Cardiovascular:  s1s2 rrr, distant tones, pulses equal bilaterally Lungs: distant bs/ no wheeze Abdomen:  Obese, soft, bsx4 active  Musculoskeletal:  No acute deformities  Skin:  Warm/dry, no edema, no lesions / open wounds   Recent Labs Lab 04/29/14 0544 04/30/14 0742 05/01/14 0535  NA 129* 132* 132*  K 4.7 4.1 3.9  CL 97 98 99  CO2 26 30 30   BUN 17 15 10   CREATININE 1.23 1.00 1.03  GLUCOSE 91 120* 113*     Recent Labs Lab 04/29/14 0544 04/30/14 0742 05/01/14 0535  HGB 9.1* 8.7* 7.9*  HCT 28.8* 26.8* 24.9*  WBC 4.8 4.5 4.2  PLT 241 257 271     04/29/2014   Amanda Laurice Couture, CCC-SLP     Diet Recommendation Regular;Thin liquid   Liquid Administration via: Straw;Cup Medication Administration: Whole meds with puree Compensations: Follow solids with liquid Postural Changes and/or Swallow Maneuvers: Seated upright 90  degrees;Upright 30-60 min after meal     ASSESSMENT / PLAN:  HCAP - new development of RLL infiltrate overnight 12/24 which was not on CTA from admission.  Given patients GI hx with prior Nissen (1994), reflux with aspiration is likely the reason for change in CXR and development of new fever. 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 Pulmonary hygiene: IS, mobilize as able and consider addition of flutter valve if patient will cooperate with it. Aspiration precautions/ diet per ST  Duoneb TID with Q3 PRN albuterol  Tirtrate  oxygen for sats > 90% CXR pending. Patient is DNR. PCCM will sign off, please call back if needed.  Rush Farmer, M.D. [facility] Pulmonary/Critical Care Medicine. Pager: [phone]. After hours pager: [phone].

## 2014-05-01 NOTE — Telephone Encounter (Signed)
Spoke to patient's son Elta Guadeloupe.He stated his father is presently in [facility] he has pneumonia.Post hospital appointment cancelled with Kerin Ransom PA 05/02/14.Stated he is helping father with his care and he would like to be called with appointments.

## 2014-05-01 NOTE — Progress Notes (Addendum)
Clinical Social Work Department CLINICAL SOCIAL WORK PLACEMENT NOTE 05/01/2014  Patient:  Curtis [name], Curtis [name]  Account Number:  [id number] Admit date:  04/24/2014  Clinical Social Worker:  Sindy Messing, LCSW  Date/time:  05/01/2014 10:40 AM  Clinical Social Work is seeking post-discharge placement for this patient at the following level of care:   SKILLED NURSING   (*CSW will update this form in Epic as items are completed)   05/01/2014  Patient/family provided with [facility] Department of Clinical Social Work's list of facilities offering this level of care within the geographic area requested by the patient (or if unable, by the patient's family).  05/01/2014  Patient/family informed of their freedom to choose among providers that offer the needed level of care, that participate in Medicare, Medicaid or managed care program needed by the patient, have an available bed and are willing to accept the patient.  05/01/2014  Patient/family informed of [facility]' ownership interest in [facility], as well as of the fact that they are under no obligation to receive care at this facility.  PASARR submitted to EDS on 05/01/2014 PASARR number received on 05/01/2014  FL2 transmitted to all facilities in geographic area requested by pt/family on  05/01/2014 FL2 transmitted to all facilities within larger geographic area on   Patient informed that his/her managed care company has contracts with or will negotiate with  certain facilities, including the following:     Patient/family informed of bed offers received: 05/01/2014  Patient chooses bed at [place] recommends and patient chooses bed at    Patient to be transferred to [facility] on  05/02/14 Patient to be transferred to facility by [facility] Patient and family notified of transfer on 05/02/14 Name of family member notified:  Son-Mark at bedside  The following physician request were entered in Epic:   Additional  Comments:

## 2014-05-01 NOTE — Progress Notes (Signed)
Clinical Social Work  Patient was discussed during progression meeting and MD reports patient has changed his mind and now agreeable to SNF. CSW met with patient at bedside and left SNF list. Patient reports son is making all decisions and to talk with him. CSW spoke with son and explained process. Son requests [facility] search and reports he will tour facilities prior to making a decision. CSW explained that MD expects patient to be medically stable tomorrow. Son aware and understanding that he will have to make a decision for placement. CSW will follow up with bed offers.  CSW faxed out information to [facility].  [place], [place] [phone]

## 2014-05-01 NOTE — Progress Notes (Signed)
Speech Language Pathology Treatment: Dysphagia  Patient Details Name: Curtis [name] MRN: [id number] DOB: 10-30-1945 Today's Date: 05/01/2014 Time: 0920-0946 SLP Time Calculation (min) (ACUTE ONLY): 26 min  Assessment / Plan / Recommendation Clinical Impression  Treatment focused on f/u diet tolerance assessment s/p MBS 12/26 and patient/family education. Skilled observation complete with current diet consistencies. Patient presents with what appears to be a functional oropharyngeal swallow with sufficient mastication of bolus and no overt indication of aspiration. Patient independently verbalizing and utilizing safe swallow strategies and esophageal precautions. Education complete regarding risk of episodic aspiration related to esophageal dysfunction which can be minimized via use of precautions. Patient and son verbalizing understanding. No f/u SLP services needed at this time however would consider repeat upper GI to fully evaluate esophageal function now that oropharyngeal swallow has been cleared for safety. Patient continues to c/o  regurgitation which began 3 weeks ago.     HPI HPI: 68 y.o. male with CAD, NSTEMI back in Sept, presented to the ED with c/o SOB, cough, congestion.  Dx acute hypoxic respiratory failure secondary to ?HCAP/aspiration. Pt has hx of GERD. Status post remote Nissen fundoplication of hiatal hernia ~ 1994. Pt reports return of reflux symptoms in last three weeks, stating that if he eats or drinks more than small to moderate volumes, he gets reflux with liquid regurgitating into his mouth.  Given concerns for esophageal issues as source of pna, esophagram was ordered 12/24; however, it was terminated due to laryngeal penetration to cords thick barium, aspiration thin barium. Orders received for MBS.     Pertinent Vitals Pain Assessment: No/denies pain  SLP Plan  All goals met    Recommendations Diet recommendations: Regular;Thin liquid Liquids provided via:  Cup;Straw Medication Administration: Whole meds with puree Supervision: Patient able to self feed Compensations: Follow solids with liquid Postural Changes and/or Swallow Maneuvers: Seated upright 90 degrees;Upright 30-60 min after meal              Oral Care Recommendations: Oral care BID Follow up Recommendations: None Plan: All goals met    [place], CCC-SLP [phone]   Demosthenes [name] Curtis [name] 05/01/2014, 9:47 AM

## 2014-05-01 NOTE — Telephone Encounter (Signed)
Returned call to patient no answer.LMTC. 

## 2014-05-01 NOTE — Progress Notes (Signed)
Clinical Social Work  CSW spoke with son Curtis [name]) via phone and provided bed offers. Son reports he has started to tour facilities and is considering [facility]. CSW provided all [facility] offers and son reports he will review list and speak with patient today. CSW will follow up with patient and family tomorrow.  [place], [place] [phone]

## 2014-05-01 NOTE — Progress Notes (Signed)
Physical Therapy Treatment Patient Details Name: Curtis [name] MRN: [id number] DOB: 1945/07/30 Today's Date: 2014/05/08    History of Present Illness Patient is a 68 y/o male with recent hx of cardioversion and L cardiac cath 9/15 also PMH of hypertension, PVD, AAA, COPD with pulmonary asbestosis admitted 04/24/14 for HCAP.    PT Comments    Pt very deconditioned, much weaker than last PT visit, needs SNF  Follow Up Recommendations  SNF     Equipment Recommendations  None recommended by PT    Recommendations for Other Services       Precautions / Restrictions Precautions Precautions: Fall    Mobility  Bed Mobility Overal bed mobility: Needs Assistance Bed Mobility: Supine to Sit     Supine to sit: Min guard     General bed mobility comments: verbal cues for technique, assist for trunk, incr time  Transfers Overall transfer level: Needs assistance Equipment used: Rolling walker (2 wheeled) Transfers: Sit to/from [facility] Sit to Stand: Min assist;From elevated surface (adn non-elevated surface) Stand pivot transfers: Min assist       General transfer comment: verbal cues for safe technique, hand placement, control of descent; SPT x2  Ambulation/Gait Ambulation/Gait assistance: Min assist Ambulation Distance (Feet): 5 Feet (x2) Assistive device: Rolling walker (2 wheeled) Gait Pattern/deviations: Step-to pattern;Trunk flexed;Decreased step length - right;Decreased step length - left Gait velocity: decr   General Gait Details: assist to maneuver RW, cues for technique, incr time, pivotal steps and fwd/back   Stairs            Wheelchair Mobility    Modified Rankin (Stroke Patients Only)       Balance                                    Cognition Arousal/Alertness: Awake/alert Behavior During Therapy: WFL for tasks assessed/performed Overall Cognitive Status: Within Functional Limits for tasks assessed                       Exercises      General Comments        Pertinent Vitals/Pain Pain Assessment: 0-10 Pain Score: 3  Pain Location: bile feet and LLE Pain Descriptors / Indicators: Discomfort Pain Intervention(s): Limited activity within patient's tolerance;Monitored during session;Repositioned    Home Living                      Prior Function            PT Goals (current goals can now be found in the care plan section) Acute Rehab PT Goals Patient Stated Goal: I PT Goal Formulation: With patient Time For Goal Achievement: 05/03/14 Potential to Achieve Goals: Good Progress towards PT goals: Progressing toward goals    Frequency  Min 3X/week    PT Plan Current plan remains appropriate    Co-evaluation             End of Session Equipment Utilized During Treatment: Gait belt;Oxygen Activity Tolerance: Patient limited by fatigue Patient left: in chair;with call bell/phone within reach     Time: 1202-1228 PT Time Calculation (min) (ACUTE ONLY): 26 min  Charges:  $Therapeutic Activity: 23-37 mins                    G Codes:      Mana Morison 05/08/2014, 12:43 PM

## 2014-05-01 NOTE — Progress Notes (Signed)
ANTIBIOTIC CONSULT NOTE - Follow up  Pharmacy Consult for Zosyn Indication: HCAP/aspiration PNA  Allergies  Allergen Reactions  . Codeine Itching    Patient Measurements: Height: 6' (182.9 cm) Weight: 200 lb 4.8 oz (90.855 kg) IBW/kg (Calculated) : 77.6   Vital Signs: Temp: 98.1 F (36.7 C) (12/28 0510) Temp Source: Oral (12/28 0510) BP: 109/53 mmHg (12/28 0510) Pulse Rate: 71 (12/28 0510) Intake/Output from previous day: 12/27 0701 - 12/28 0700 In: 110 [P.O.:110] Out: 800 [Urine:800] Intake/Output from this shift:    Labs:  Recent Labs  04/29/14 0544 04/30/14 0742 05/01/14 0535  WBC 4.8 4.5 4.2  HGB 9.1* 8.7* 7.9*  PLT 241 257 271  CREATININE 1.23 1.00 1.03   Estimated Creatinine Clearance: 74.3 mL/min (by C-G formula based on Cr of 1.03). No results for input(s): VANCOTROUGH, VANCOPEAK, VANCORANDOM, GENTTROUGH, GENTPEAK, GENTRANDOM, TOBRATROUGH, TOBRAPEAK, TOBRARND, AMIKACINPEAK, AMIKACINTROU, AMIKACIN in the last 72 hours.     Medical History: Past Medical History  Diagnosis Date  . Arthritis   . SOB (shortness of breath)   . Pulmonary asbestosis   . Hernia of unspecified site of abdominal cavity without mention of obstruction or gangrene     hiatal  . GERD (gastroesophageal reflux disease)   . Depression   . Diverticulitis     hospital 2011 [facility]  . Diverticulosis   . Reflex sympathetic dystrophy   . 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 Hypertension   . IBS (irritable bowel syndrome)   . Peripheral vascular disease     ankle brachial index of 0.79 on the right and 0.61 on the left.   . Myocardial infarction 01/2014    NSTEMI  . Hyperlipidemia LDL goal <70 04/20/2014  . AAA (abdominal aortic aneurysm), stable 04/20/2014    Medications:  Scheduled:  . alprazolam  2 mg Oral BID  . aspirin  81 mg Oral Daily  . clopidogrel  75 mg Oral Daily  . finasteride  5 mg Oral Daily  . heparin  5,000 Units Subcutaneous 3 times per day  . ipratropium-albuterol  3 mL  Nebulization TID  . lubiprostone  24 mcg Oral BID WC  . metoprolol  25 mg Oral BID  . pantoprazole (PROTONIX) IV  40 mg Intravenous Q12H  . piperacillin-tazobactam (ZOSYN)  IV  3.375 g Intravenous Q8H  . polyethylene glycol  17 g Oral Daily  . senna-docusate  1 tablet Oral BID  . tamsulosin  0.4 mg Oral QHS    Infusions:      Microbiology: 12/21Blood x2:NGTD 12/22 S. pneumo and legionella U Ag: both Neg 12/24 Blood x2: ngtd 12/24 Urine: ng-final 12/27 CDiff: negative  Anti-infectives: 12/21 >>Zosyn >> x1 ED 12/21 >>vancomycin >> 12/27 12/22>>cefepime >> 12/25 12/25 >> Zosyn >>  Assessment: 58 yoM c/o SOB, cough and congestion after recent hospitalization for cardiac work-up due to SOB. Cefepime and Vancomycin ordered per Rx for HCAP.   Significant Events: 12/24: T spike to 103 and CXR showed new RLL infiltrate.  Aspiration noted during barium swallow. 12/25: Pulmonary/CCM changed cefepime to Zosyn for treatment of aspiration PNA.  Today, 12/28: D#4 Zosyn 3.375 grams IV q8h (extended-infusion) Has completed 4 days of cefepime and 7 days of vancomycin Afebrile WBC remains normal Cultures unrevealing. SCr improved.  Current Zosyn dosage appropriate.   Goal of Therapy:  Appropriate antibiotic dosing for indication and renal function; eradication of infection.   Plan:  1. No change to current Zosyn dosage (3.375 grams IV q8h, each dose over 4 hours). 2. Await  MD update on planned duration of therapy.  Clayburn Pert, PharmD, BCPS Pager: [phone] 05/01/2014  8:16 AM

## 2014-05-01 NOTE — Progress Notes (Signed)
Patient ID: Curtis [name], male   DOB: 07-Apr-1946, 68 y.o.   MRN: [id number]  [facility] PROGRESS NOTE  Curtis [name] NHA:[id number] DOB: [birth year] DOA: 04/24/2014 PCP: [name], MD   Brief narrative: 68 y.o. male with CAD, NSTEMI back in Sept, got a stent put in his circumflex at that time, see Dr. Thurman Coyer H+P done last week for cardiac details. Was admitted to Cardiology service last week due to SOB, work up that time including stress test which was reassuring. Patient presented to the ED with c/o SOB, cough, congestion, cough productive of yellowish sputum.   Work up in ED shows findings suspicious for bilateral PNA in BUL on CT scan, not seen on CXR.  Assessment and Plan:    Principal Problem:  Acute hypoxic respiratory failure, ? HCAP/aspiration (pt with known dx of COPD/asbestosis in 2005 by Dr. Annamaria Boots) - thought that spike in fever from aspiration PNA - completed Vanc day #7, maxipime completed for 4 days, Zosyn day #5/7 - stopped Vanc 12/27 - provide oxygen, BD' scheduled and as needed  - SLP requested, pt apparently not aspirating at this time and diet advanced to heart healthy  - appreciate PCCM input  - when ready for d/c, when bed available, can switch to augmentin   Increased prominence of mediastinal and bilateral hilar lymphadenopathy - per PCCM  Active Problems:  Anemia of chronic disease, iron def - no signs of active bleeding, Hg drop overnight - will repeat CBC in AM  Hyponatremia - likely pre renal, now better and 132 this AM  HTN - stable this AM - continue to hold Norvasc but continue metoprolol for now  HLD - continue statin   GERD - stable, continue Protonix   CAD (coronary artery disease), native coronary artery - continue aspirin and plavix  Severe PCM - in the context of acute illness - not aspirating per SLP evaluation  - tolerating regular diet well   Presence of drug coated stent in left  circumflex coronary artery: Promus DES 3.5 mm x 38 mm (3.9 mm) - on aspirin and plavix but have to hold for now until MBS done   DVT prophylaxis  Heparin SQ while pt is in hospital, change to [facility] 12/28 as Hg drop overnight   Code Status: DNR Family Communication: Pt at bedside, son over the phone  Disposition Plan: Home vs SNF when medically stable  IV Access:    Peripheral IV Procedures and diagnostic studies:    Dg Chest 2 View 04/24/2014 New trace pleural effusions with a few Kerley B-lines likely indicating developing interstitial edema.  Ct Angio Chest Pe W/cm &/or Wo Cm 04/24/2014 1. No evidence of pulmonary embolus. 2. Mild patchy and nodular airspace opacities within the upper lobes bilaterally, concerning for an acute infectious process. 3. Diffuse asbestos-related pleural disease, with subpleural consolidation and plaques, and scattered calcification, similar in appearance to 2014. No new masses seen. 4. Increased prominence of mediastinal and bilateral hilar lymphadenopathy. This could reflect a systemic conditions such as sarcoidosis; lymphoma or metastatic disease cannot be excluded. Several of these nodes would be amenable to biopsy. Medical Consultants:    PCCM Other Consultants:    Physical therapy  Anti-Infectives:    Vancomycin 12.21 --> 12/27  Maxipime 12/21 --> 12/24  Zosyn 12/24 -->   [name], MD  [facility] Pager [phone]  If 7PM-7AM, please contact night-coverage [email or web address] Password [email or web address] 05/01/2014, 3:09 PM   LOS: 7 days   HPI/Subjective: No events overnight.  Objective: Filed Vitals:   05/01/14 0750 05/01/14 1026 05/01/14 1408 05/01/14 1427  BP:  132/60 122/64   Pulse:  78 75   Temp:   97.5 F (36.4 C)   TempSrc:   Oral   Resp:   22   Height:      Weight:      SpO2: 95%  97% 97%    Intake/Output Summary (Last 24 hours) at 05/01/14 1509 Last data filed at 05/01/14 1411  Gross per  24 hour  Intake    170 ml  Output    950 ml  Net   -780 ml    Exam:   General:  Pt is alert, follows commands appropriately, not in acute distress  Cardiovascular: Regular rate and rhythm, S1/S2, no murmurs, no rubs, no gallops  Respiratory: Clear to auscultation bilaterally, no wheezing, rhonchi at bases mild   Abdomen: Soft, non tender, non distended, bowel sounds present, no guarding  Extremities: No edema, pulses DP and PT palpable bilaterally  Neuro: Grossly nonfocal  Data Reviewed: Basic Metabolic Panel:  Recent Labs Lab 04/27/14 0410 04/28/14 0415 04/29/14 0544 04/30/14 0742 05/01/14 0535  NA 133* 129* 129* 132* 132*  K 4.3 4.9 4.7 4.1 3.9  CL 101 97 97 98 99  CO2 27 27 26 30 30   GLUCOSE 106* 123* 91 120* 113*  BUN 14 16 17 15 10   CREATININE 1.34 1.32 1.23 1.00 1.03  CALCIUM 8.8 8.4 8.5 8.8 8.3*   Liver Function Tests:  Recent Labs Lab 04/24/14 1819  AST 19  ALT 18  ALKPHOS 105  BILITOT 0.3  PROT 7.6  ALBUMIN 3.8   CBC:  Recent Labs Lab 04/24/14 1819  04/27/14 0410 04/28/14 0415 04/29/14 0544 04/30/14 0742 05/01/14 0535  WBC 3.9*  < > 5.1 6.1 4.8 4.5 4.2  NEUTROABS 2.4  --   --   --   --   --   --   HGB 10.1*  < > 9.4* 8.8* 9.1* 8.7* 7.9*  HCT 32.3*  < > 28.9* 27.5* 28.8* 26.8* 24.9*  MCV 90.5  < > 89.5 89.0 90.0 87.0 87.7  PLT 284  < > 260 263 241 257 271  < > = values in this interval not displayed. Cardiac Enzymes:  Recent Labs Lab 04/24/14 1819  TROPONINI <0.30    Recent Results (from the past 240 hour(s))  Culture, blood (routine x 2)     Status: None   Collection Time: 04/24/14 10:53 PM  Result Value Ref Range Status   Specimen Description BLOOD LEFT ARM  Final   Special Requests BOTTLES DRAWN AEROBIC ONLY 5CC  Final   Culture  Setup Time   Final    04/25/2014 04:47 Performed at [facility]   Final    NO GROWTH 5 DAYS Performed at [facility]    Report Status 05/01/2014 FINAL  Final   Culture, blood (routine x 2)     Status: None   Collection Time: 04/24/14 10:53 PM  Result Value Ref Range Status   Specimen Description BLOOD RIGHT ARM  Final   Special Requests BOTTLES DRAWN AEROBIC AND ANAEROBIC 5CC  Final   Culture  Setup Time   Final    04/25/2014 04:47 Performed at [facility]   Final    NO GROWTH 5 DAYS Performed at [facility]    Report Status 05/01/2014 FINAL  Final  Culture, blood (routine x  2)     Status: None (Preliminary result)   Collection Time: 04/27/14 10:10 AM  Result Value Ref Range Status   Specimen Description BLOOD LEFT ARM  Final   Special Requests BOTTLES DRAWN AEROBIC AND ANAEROBIC 5CC  Final   Culture   Final           BLOOD CULTURE RECEIVED NO GROWTH TO DATE CULTURE WILL BE HELD FOR 5 DAYS BEFORE ISSUING A FINAL NEGATIVE REPORT Performed at [facility]    Report Status PENDING  Incomplete  Culture, blood (routine x 2)     Status: None (Preliminary result)   Collection Time: 04/27/14 10:16 AM  Result Value Ref Range Status   Specimen Description BLOOD RIGHT HAND  Final   Special Requests BOTTLES DRAWN AEROBIC ONLY 5CC  Final   Culture   Final           BLOOD CULTURE RECEIVED NO GROWTH TO DATE CULTURE WILL BE HELD FOR 5 DAYS BEFORE ISSUING A FINAL NEGATIVE REPORT Performed at [facility]    Report Status PENDING  Incomplete  Culture, Urine     Status: None   Collection Time: 04/27/14 10:50 AM  Result Value Ref Range Status   Specimen Description URINE, RANDOM  Final   Special Requests NONE  Final   Colony Count NO GROWTH Performed at [facility]   Final   Culture NO GROWTH Performed at [facility]   Final   Report Status 04/28/2014 FINAL  Final  Clostridium Difficile by PCR     Status: None   Collection Time: 04/30/14  1:32 AM  Result Value Ref Range Status   C difficile by pcr NEGATIVE NEGATIVE Final    Comment: Performed at [facility]      Scheduled Meds: . alprazolam  2 mg Oral BID  . aspirin  81 mg Oral Daily  . clopidogrel  75 mg Oral Daily  . finasteride  5 mg Oral Daily  . heparin  5,000 Units Subcutaneous 3 times per day  . ipratropium-albuterol  3 mL Nebulization TID  . lubiprostone  24 mcg Oral BID WC  . metoprolol  25 mg Oral BID  . pantoprazole (PROTONIX) IV  40 mg Intravenous Q12H  . piperacillin-tazobactam (ZOSYN)  IV  3.375 g Intravenous Q8H  . polyethylene glycol  17 g Oral Daily  . senna-docusate  1 tablet Oral BID  . tamsulosin  0.4 mg Oral QHS   Continuous Infusions:

## 2014-05-02 ENCOUNTER — Encounter: Payer: Medicare Other | Admitting: Cardiology

## 2014-05-02 LAB — CBC
HCT: 26.5 % — ABNORMAL LOW (ref 39.0–52.0)
Hemoglobin: 8.3 g/dL — ABNORMAL LOW (ref 13.0–17.0)
MCH: 28.1 pg (ref 26.0–34.0)
MCHC: 31.3 g/dL (ref 30.0–36.0)
MCV: 89.8 fL (ref 78.0–100.0)
PLATELETS: 347 10*3/uL (ref 150–400)
RBC: 2.95 MIL/uL — ABNORMAL LOW (ref 4.22–5.81)
RDW: 15.2 % (ref 11.5–15.5)
WBC: 4.3 10*3/uL (ref 4.0–10.5)

## 2014-05-02 LAB — BASIC METABOLIC PANEL
Anion gap: 6 (ref 5–15)
BUN: 7 mg/dL (ref 6–23)
CHLORIDE: 100 meq/L (ref 96–112)
CO2: 31 mmol/L (ref 19–32)
CREATININE: 1.07 mg/dL (ref 0.50–1.35)
Calcium: 8.9 mg/dL (ref 8.4–10.5)
GFR calc non Af Amer: 69 mL/min — ABNORMAL LOW (ref >90)
GFR, EST AFRICAN AMERICAN: 80 mL/min — AB (ref >90)
Glucose, Bld: 140 mg/dL — ABNORMAL HIGH (ref 70–99)
POTASSIUM: 3.8 mmol/L (ref 3.5–5.1)
Sodium: 137 mmol/L (ref 135–145)

## 2014-05-02 MED ORDER — IPRATROPIUM-ALBUTEROL 0.5-2.5 (3) MG/3ML IN SOLN: 3.0000 mL | Freq: Three times a day (TID) | RESPIRATORY_TRACT | Status: DC

## 2014-05-02 MED ORDER — HYDROCODONE-ACETAMINOPHEN 5-325 MG PO TABS: 1.0000 | ORAL_TABLET | ORAL | Status: DC | PRN

## 2014-05-02 MED ORDER — ALBUTEROL SULFATE (2.5 MG/3ML) 0.083% IN NEBU: 2.5000 mg | INHALATION_SOLUTION | RESPIRATORY_TRACT | Status: DC | PRN

## 2014-05-02 MED ORDER — ALPRAZOLAM 2 MG PO TABS: 2.0000 mg | ORAL_TABLET | Freq: Two times a day (BID) | ORAL | Status: DC

## 2014-05-02 MED ORDER — AMOXICILLIN-POT CLAVULANATE 875-125 MG PO TABS: 1.0000 | ORAL_TABLET | Freq: Two times a day (BID) | ORAL | Status: DC

## 2014-05-02 NOTE — Discharge Instructions (Signed)
Aspiration Precautions Aspiration is the inhaling of a liquid or object into the lungs. Things that can be inhaled into the lungs include:  Food.  Any type of liquid, such as drinks or saliva.  Stomach contents, such as vomit or stomach acid. When these things go into the lungs, damage can occur. Serious complications can then result, such as:  A lung infection (pneumonia).  A collection of pus in the lungs (lung abscess). CAUSES  A decreased level of awareness (consciousness) due to:  Traumatic brain injury or head injury.  Stroke.  Neurological disease.  Seizures.  Decreased or absent gag reflex (inability to cough).  Medical conditions that affect swallowing.  Conditions that affect the food pipe (esophagus) such as a narrowing of the esophagus (esophageal stricture).  Gastroesophageal reflux (GERD). This is also known as acid reflux.  Any type of surgery where you are put under general anesthesia or have sedation.  Drinking large amounts of alcohol.  Taking medication that causes drowsiness, confusion, or weakness.  Aging.  Dental problems.  Having a feeding tube. SYMPTOMS When aspiration occurs, different signs and symptoms can occur, such as:  Coughing (if a person has a cough or gag reflex) after swallowing food or liquids.  Difficulty breathing. This can include things like:  Breathing rapidly.  Breathing very slowly.  Loud breathing.  Hearing "gurgling" lung sounds when a person breaths.  Coughing up phlegm (sputum) that is:  Yellow, tan, or green in color.  Has pieces of food in it.  Bad smelling.  A change in voice (hoarseness) or a "gurgly" sound to the voice.  A change in skin color. The skin may turn red, or a "bluish" type color because of a lack of oxygen (cyanosis).  Fever.  Eyes watering.  Pain in the chest or back.  Facial grimacing .  A feeling of fullness in the throat or that something is stuck in the  throat. DIAGNOSIS  A chest X-ray may be performed. This takes a picture of your lungs. It can show changes in the lungs if aspiration has occurred.  A bronchoscopy may be performed. This is a surgical procedure in which a thin, flexible tube with a camera at the end is inserted into the nose or mouth. The tube is advanced to the lungs so your health care provider can view the lungs and obtain a culture, tissue sample, or remove an aspirated object.  A swallowing evaluation study may be performed to evaluate:  A person's risk of aspiration.  How difficult it is for a person to swallow.  What types of foods are safe for a person to eat. PREVENTION If you are a caregiver to someone who may aspirate, follow the directions below. If you are caring for someone who can eat and drink through their mouth:  Have them sit in an upright position when eating food or drinking fluids, such as:  Sitting up in a chair.  If sitting in a chair is not possible, position the person in bed so they are upright.  Remind the person to eat slowly and chew well.  Do not distract the person. This is especially important for people with thinking or memory (cognitive) problems.  Check the person's mouth for leftover food after eating.  Keep the person sitting upright for 30 to 45 minutes after eating.  Do not serve food or drink for at least 2 hours before bedtime. If you are caring for someone with a feeding tube and he or she   cannot eat or drink through their mouth:  Keep the person in an upright position as much as possible.  Do not  lay the person flat if they are getting continuous feedings. Turn the feeding pump off if you need to lay the person flat for any reason.  Check feeding tube residuals as directed by your health care provider. If a large amount of tube feedings are pulled back (aspirated) from the feeding tube, call your health care provider right away. General guidelines to prevent  aspiration include:  Feed small amounts of food. Do not force feed.  Use as little water as possible when brushing the person's teeth or cleaning his or her mouth.  Provide oral care before and after meals.  Never put food or fluids in the mouth of a person who is not fully alert.  Crush pills and put them in soft food such as pudding or ice cream. Some pills should not be crushed. Check with your health care provider before crushing any medication. SEEK IMMEDIATE MEDICAL CARE IF:   The person has trouble breathing or starts to breathe rapidly.  The person is breathing very slowly or stops breathing.  The person coughs a lot after eating or drinking.  The person has a chronic cough.  The person coughs up thick, yellow, or tan sputum.  The person has a fever or persistent symptoms for more than 72 hours.  The person has a fever and their symptoms suddenly get worse. Document Released: 05/24/2010 Document Revised: 04/26/2013 Document Reviewed: 07/27/2013 ExitCare Patient Information 2015 [employer]. This information is not intended to replace advice given to you by your health care provider. Make sure you discuss any questions you have with your health care provider.  

## 2014-05-02 NOTE — Progress Notes (Addendum)
Clinical Social Work  CSW reviewed chart which stated that MD has DC patient today. CSW spoke with son via phone and explained that MD has DC patient. Son reports he is still touring SNFs and has not made a decision. CSW agreeable to follow up in a couple of hours and son agreeable to have a decision at that time. CSW will continue to follow.  Sindy Messing, [place] [phone]  Addendum 1120 CSW met with son in waiting room. Son reports he is still deciding between SNF vs HH. Son plans to go to [place] and then will talk with patient. Son agreeable to have a decision in the next couple of hours and understands that patient has been DC.

## 2014-05-02 NOTE — Progress Notes (Signed)
o2 at 3 liters removed for 5 minutes, sats dropped to 81%.....returned to 3 liters o2,  sat at 91%  D [employer]

## 2014-05-02 NOTE — Discharge Summary (Signed)
Physician Discharge Summary  Curtis [name] WUJ:[id number] DOB: Sep 22, 1945 DOA: 04/24/2014  PCP: [name], MD  Admit date: 04/24/2014 Discharge date: 05/02/2014  Recommendations for Outpatient Follow-up:  1. Pt will need to follow up with PCP in 2-3 weeks post discharge 2. Please obtain BMP to evaluate electrolytes and kidney function 3. Please also check CBC to evaluate Hg and Hct levels 4. Continue Augmentin for 3 days post discharge   Discharge Diagnoses:  Principal Problem:   HCAP (healthcare-associated pneumonia) Active Problems:   GERD   HTN (hypertension)   CAD (coronary artery disease), native coronary artery   Presence of drug coated stent in left circumflex coronary artery: Promus DES 3.5 mm x 38 mm (3.9 mm)   Aspiration into airway   Shortness of breath   Chronic respiratory failure with hypoxia   Discharge Condition: Stable  Diet recommendation: Heart healthy diet discussed in details    Brief narrative: 68 y.o. male with CAD, NSTEMI back in Sept, got a stent put in his circumflex at that time, see Dr. Thurman Coyer H+P done last week for cardiac details. Was admitted to Cardiology service last week due to SOB, work up that time including stress test which was reassuring. Patient presented to the ED with c/o SOB, cough, congestion, cough productive of yellowish sputum.   Work up in ED shows findings suspicious for bilateral PNA in BUL on CT scan, not seen on CXR.  Assessment and Plan:    Principal Problem:  Acute hypoxic respiratory failure, ? HCAP/aspiration (pt with known dx of COPD/asbestosis in 2005 by Dr. Annamaria Boots) - thought that spike in fever from aspiration PNA - completed Vanc day #7, maxipime completed for 4 days, Zosyn day #6 - stopped Vanc 12/27 - provide oxygen, BD' scheduled and as needed  - SLP requested, pt apparently not aspirating at this time and diet advanced to heart healthy  - switch to Augmentin upon discharge for 3  more days of therapy   Increased prominence of mediastinal and bilateral hilar lymphadenopathy - per PCCM, outpatient follow up Active Problems:  Anemia of chronic disease, iron def - no signs of active bleeding, Hg stable   Hyponatremia - likely pre renal, now better and 137 this AM  HTN - stable this AM  HLD - continue statin   GERD - stable, continue Protonix   CAD (coronary artery disease), native coronary artery - continue aspirin and plavix  Severe PCM - in the context of acute illness - not aspirating per SLP evaluation  - tolerating regular diet well   Presence of drug coated stent in left circumflex coronary artery: Promus DES 3.5 mm x 38 mm (3.9 mm) - on aspirin and plavix which can be continued upon discharge    Code Status: DNR Family Communication: Pt at bedside, son over the phone  Disposition Plan: SNF when bed available   IV Access:    Peripheral IV Procedures and diagnostic studies:    Dg Chest 2 View 04/24/2014 New trace pleural effusions with a few Kerley B-lines likely indicating developing interstitial edema.  Ct Angio Chest Pe W/cm &/or Wo Cm 04/24/2014 1. No evidence of pulmonary embolus. 2. Mild patchy and nodular airspace opacities within the upper lobes bilaterally, concerning for an acute infectious process. 3. Diffuse asbestos-related pleural disease, with subpleural consolidation and plaques, and scattered calcification, similar in appearance to 2014. No new masses seen. 4. Increased prominence of mediastinal and bilateral hilar lymphadenopathy. This could reflect a systemic conditions such as sarcoidosis;  lymphoma or metastatic disease cannot be excluded. Several of these nodes would be amenable to biopsy. Medical Consultants:    PCCM Other Consultants:    Physical therapy  Anti-Infectives:    Vancomycin 12.21 --> 12/27  Maxipime 12/21 --> 12/24  Zosyn 12/24 --> 12/29  Augmentin  12/29 -->   Discharge Exam: Filed Vitals:   05/02/14 0558  BP: 135/59  Pulse: 63  Temp: 98.2 F (36.8 C)  Resp: 20   Filed Vitals:   05/01/14 2059 05/01/14 2203 05/02/14 0558 05/02/14 0756  BP:  150/81 135/59   Pulse:  92 63   Temp:  98.2 F (36.8 C) 98.2 F (36.8 C)   TempSrc:  Oral Oral   Resp:  20 20   Height:      Weight:      SpO2: 97% 95% 97% 95%    General: Pt is alert, follows commands appropriately, not in acute distress Cardiovascular: Regular rate and rhythm, S1/S2 +, no rubs, no gallops Respiratory: Clear to auscultation bilaterally, no wheezing, no crackles, no rhonchi Abdominal: Soft, non tender, non distended, bowel sounds +, no guarding  Discharge Instructions  Discharge Instructions    Diet - low sodium heart healthy    Complete by:  As directed      Increase activity slowly    Complete by:  As directed             Medication List    TAKE these medications        albuterol (2.5 MG/3ML) 0.083% nebulizer solution  Commonly known as:  PROVENTIL  Take 3 mLs (2.5 mg total) by nebulization every 3 (three) hours as needed for wheezing or shortness of breath.     alprazolam 2 MG tablet  Commonly known as:  XANAX  Take 1 tablet (2 mg total) by mouth 2 (two) times daily.     amitriptyline 75 MG tablet  Commonly known as:  ELAVIL  Take 75 mg by mouth at bedtime.     amLODipine 2.5 MG tablet  Commonly known as:  NORVASC  Take 1 tablet (2.5 mg total) by mouth daily.     amoxicillin-clavulanate 875-125 MG per tablet  Commonly known as:  AUGMENTIN  Take 1 tablet by mouth 2 (two) times daily.     aspirin 81 MG chewable tablet  Chew 1 tablet (81 mg total) by mouth daily.     atorvastatin 40 MG tablet  Commonly known as:  LIPITOR  Take 1 tablet (40 mg total) by mouth daily at 6 PM.     clopidogrel 75 MG tablet  Commonly known as:  PLAVIX  Take 1 tablet (75 mg total) by mouth daily.     finasteride 5 MG tablet  Commonly known as:  PROSCAR   Take 5 mg by mouth every morning.     gabapentin 300 MG capsule  Commonly known as:  NEURONTIN  Take 300 mg by mouth 3 (three) times daily.     HYDROcodone-acetaminophen 5-325 MG per tablet  Commonly known as:  NORCO/VICODIN  Take 1-2 tablets by mouth every 4 (four) hours as needed for moderate pain.     ipratropium-albuterol 0.5-2.5 (3) MG/3ML Soln  Commonly known as:  DUONEB  Take 3 mLs by nebulization 3 (three) times daily.     lubiprostone 24 MCG capsule  Commonly known as:  AMITIZA  Take 24 mcg by mouth 2 (two) times daily with a meal.     metoprolol 50 MG tablet  Commonly known  as:  LOPRESSOR  Take 25 mg by mouth 2 (two) times daily.     nitroGLYCERIN 0.4 MG SL tablet  Commonly known as:  NITROSTAT  Place 1 tablet (0.4 mg total) under the tongue every 5 (five) minutes x 3 doses as needed for chest pain.     pantoprazole 40 MG tablet  Commonly known as:  PROTONIX  Take 1 tablet (40 mg total) by mouth daily.     tamsulosin 0.4 MG Caps capsule  Commonly known as:  FLOMAX  Take 1 capsule (0.4 mg total) by mouth daily.           Follow-up Information    Follow up with [name], MD.   Specialty:  [facility] Medicine   Contact information:   [address] [place] [address] [phone]        The results of significant diagnostics from this hospitalization (including imaging, microbiology, ancillary and laboratory) are listed below for reference.     Microbiology: Recent Results (from the past 240 hour(s))  Culture, blood (routine x 2)     Status: None   Collection Time: 04/24/14 10:53 PM  Result Value Ref Range Status   Specimen Description BLOOD LEFT ARM  Final   Special Requests BOTTLES DRAWN AEROBIC ONLY 5CC  Final   Culture  Setup Time   Final    04/25/2014 04:47 Performed at [place]   Final    NO GROWTH 5 DAYS Performed at [facility]    Report Status 05/01/2014 FINAL  Final  Culture, blood  (routine x 2)     Status: None   Collection Time: 04/24/14 10:53 PM  Result Value Ref Range Status   Specimen Description BLOOD RIGHT ARM  Final   Special Requests BOTTLES DRAWN AEROBIC AND ANAEROBIC 5CC  Final   Culture  Setup Time   Final    04/25/2014 04:47 Performed at [facility]    Culture   Final    NO GROWTH 5 DAYS Performed at [facility]    Report Status 05/01/2014 FINAL  Final  Culture, blood (routine x 2)     Status: None (Preliminary result)   Collection Time: 04/27/14 10:10 AM  Result Value Ref Range Status   Specimen Description BLOOD LEFT ARM  Final   Special Requests BOTTLES DRAWN AEROBIC AND ANAEROBIC 5CC  Final   Culture   Final           BLOOD CULTURE RECEIVED NO GROWTH TO DATE CULTURE WILL BE HELD FOR 5 DAYS BEFORE ISSUING A FINAL NEGATIVE REPORT Performed at [facility]    Report Status PENDING  Incomplete  Culture, blood (routine x 2)     Status: None (Preliminary result)   Collection Time: 04/27/14 10:16 AM  Result Value Ref Range Status   Specimen Description BLOOD RIGHT HAND  Final   Special Requests BOTTLES DRAWN AEROBIC ONLY 5CC  Final   Culture   Final           BLOOD CULTURE RECEIVED NO GROWTH TO DATE CULTURE WILL BE HELD FOR 5 DAYS BEFORE ISSUING A FINAL NEGATIVE REPORT Performed at [facility]    Report Status PENDING  Incomplete  Culture, Urine     Status: None   Collection Time: 04/27/14 10:50 AM  Result Value Ref Range Status   Specimen Description URINE, RANDOM  Final   Special Requests NONE  Final   Colony Count NO  GROWTH Performed at [facility]   Final   Culture NO GROWTH Performed at [facility]   Final   Report Status 04/28/2014 FINAL  Final  Clostridium Difficile by PCR     Status: None   Collection Time: 04/30/14  1:32 AM  Result Value Ref Range Status   C difficile by pcr NEGATIVE NEGATIVE Final    Comment: Performed at [place]: Basic Metabolic  Panel:  Recent Labs Lab 04/28/14 0415 04/29/14 0544 04/30/14 0742 05/01/14 0535 05/02/14 0445  NA 129* 129* 132* 132* 137  K 4.9 4.7 4.1 3.9 3.8  CL 97 97 98 99 100  CO2 27 26 30 30 31   GLUCOSE 123* 91 120* 113* 140*  BUN 16 17 15 10 7   CREATININE 1.32 1.23 1.00 1.03 1.07  CALCIUM 8.4 8.5 8.8 8.3* 8.9   Liver Function Tests: No results for input(s): AST, ALT, ALKPHOS, BILITOT, PROT, ALBUMIN in the last 168 hours. No results for input(s): LIPASE, AMYLASE in the last 168 hours. No results for input(s): AMMONIA in the last 168 hours. CBC:  Recent Labs Lab 04/28/14 0415 04/29/14 0544 04/30/14 0742 05/01/14 0535 05/02/14 0445  WBC 6.1 4.8 4.5 4.2 4.3  HGB 8.8* 9.1* 8.7* 7.9* 8.3*  HCT 27.5* 28.8* 26.8* 24.9* 26.5*  MCV 89.0 90.0 87.0 87.7 89.8  PLT 263 241 257 271 347   Cardiac Enzymes: No results for input(s): CKTOTAL, CKMB, CKMBINDEX, TROPONINI in the last 168 hours. BNP: BNP (last 3 results)  Recent Labs  01/28/14 1900 04/17/14 1440 04/24/14 1820  PROBNP 336.5* 215.3* 330.3*   CBG: No results for input(s): GLUCAP in the last 168 hours.   SIGNED: Time coordinating discharge: Over 30 minutes  [name], MD  [facility] 05/02/2014, 8:50 AM Pager [phone]  If 7PM-7AM, please contact night-coverage [email or web address] Password [email or web address]

## 2014-05-02 NOTE — Progress Notes (Signed)
Clinical Social Work  Patient and son agreeable to SNF placement at [facility]. Son is happy that patient is agreeable to go to SNF and patient is happy to have a private room. CSW prepared DC packet with FL2, DC summary, DNR form, and hard scripts included. RN to call report to [facility] who can accept after 2 pm today. Son prefers [facility] to provide transportation and is aware of no guarantee of payment. [facility] #:[id number].  CSW is signing off but available if needed.  [place], [place] [phone]

## 2014-05-03 ENCOUNTER — Other Ambulatory Visit: Payer: Self-pay | Admitting: *Deleted

## 2014-05-03 ENCOUNTER — Non-Acute Institutional Stay (SKILLED_NURSING_FACILITY): Payer: Medicare Other | Admitting: Internal Medicine

## 2014-05-03 ENCOUNTER — Encounter: Payer: Self-pay | Admitting: Internal Medicine

## 2014-05-03 DIAGNOSIS — D509 Iron deficiency anemia, unspecified: Secondary | ICD-10-CM

## 2014-05-03 DIAGNOSIS — J189 Pneumonia, unspecified organism: Secondary | ICD-10-CM

## 2014-05-03 DIAGNOSIS — J9621 Acute and chronic respiratory failure with hypoxia: Secondary | ICD-10-CM | POA: Diagnosis not present

## 2014-05-03 DIAGNOSIS — I25118 Atherosclerotic heart disease of native coronary artery with other forms of angina pectoris: Secondary | ICD-10-CM | POA: Diagnosis not present

## 2014-05-03 DIAGNOSIS — E871 Hypo-osmolality and hyponatremia: Secondary | ICD-10-CM | POA: Diagnosis not present

## 2014-05-03 DIAGNOSIS — E785 Hyperlipidemia, unspecified: Secondary | ICD-10-CM

## 2014-05-03 DIAGNOSIS — I10 Essential (primary) hypertension: Secondary | ICD-10-CM | POA: Diagnosis not present

## 2014-05-03 DIAGNOSIS — Z955 Presence of coronary angioplasty implant and graft: Secondary | ICD-10-CM

## 2014-05-03 LAB — CULTURE, BLOOD (ROUTINE X 2)
CULTURE: NO GROWTH
Culture: NO GROWTH

## 2014-05-03 MED ORDER — HYDROCODONE-ACETAMINOPHEN 5-325 MG PO TABS: ORAL_TABLET | ORAL | Status: DC

## 2014-05-03 NOTE — Telephone Encounter (Signed)
[facility] 

## 2014-05-03 NOTE — Progress Notes (Signed)
MRN: [id number] Name: Curtis [name]  Sex: male Age: 68 y.o. DOB: 03-Sep-1945  [place] #: [facility] Facility/Room:100 Level Of Care: SNF Provider: Inocencio Homes D Emergency Contacts: Extended Emergency Contact Information Primary Emergency Contact: Jepsen,Mark Address: [place]          [place] Phone: [phone] Mobile Phone: [phone] Relation: Son  Code Status: DNR  Allergies: Codeine  Chief Complaint  Patient presents with  . New Admit To SNF    HPI: Patient is 68 y.o. male who who was hosp for HCAP a week after getting a stent placed in circumflex. Pt is admitted for OT/PT.  Past Medical History  Diagnosis Date  . Arthritis   . SOB (shortness of breath)   . Pulmonary asbestosis   . Hernia of unspecified site of abdominal cavity without mention of obstruction or gangrene     hiatal  . GERD (gastroesophageal reflux disease)   . Depression   . Diverticulitis     hospital [facility]  . Diverticulosis   . Reflex sympathetic dystrophy   . 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 Hypertension   . IBS (irritable bowel syndrome)   . Peripheral vascular disease     ankle brachial index of 0.79 on the right and 0.61 on the left.   . Myocardial infarction 01/2014    NSTEMI  . Hyperlipidemia LDL goal <70 04/20/2014  . AAA (abdominal aortic aneurysm), stable 04/20/2014    Past Surgical History  Procedure Laterality Date  . Nissen fundoplication    . Right elbow surgery      x 2  . Right knee arthroscopy    . Left shoulder       x 3  . Squamous cell skin cancer      Left Hand  . Cataract extraction    . Supraventricular tachycardia ablation N/A 01/16/2014    Procedure: SUPRAVENTRICULAR TACHYCARDIA ABLATION;  Surgeon: Evans Lance, MD;  Location: [facility] CATH LAB;  Service: Cardiovascular;  Laterality: N/A;  . Left heart catheterization with coronary angiogram N/A 01/17/2014    Procedure: LEFT HEART CATHETERIZATION WITH CORONARY ANGIOGRAM;   Surgeon: Leonie Man, MD;  Location: [facility] CATH LAB;  Service: Cardiovascular;  Laterality: N/A;      Medication List       This list is accurate as of: 05/03/14 11:59 PM.  Always use your most recent med list.               albuterol (2.5 MG/3ML) 0.083% nebulizer solution  Commonly known as:  PROVENTIL  Take 3 mLs (2.5 mg total) by nebulization every 3 (three) hours as needed for wheezing or shortness of breath.     alprazolam 2 MG tablet  Commonly known as:  XANAX  Take 1 tablet (2 mg total) by mouth 2 (two) times daily.     amitriptyline 75 MG tablet  Commonly known as:  ELAVIL  Take 75 mg by mouth at bedtime.     amLODipine 2.5 MG tablet  Commonly known as:  NORVASC  Take 1 tablet (2.5 mg total) by mouth daily.     amoxicillin-clavulanate 875-125 MG per tablet  Commonly known as:  AUGMENTIN  Take 1 tablet by mouth 2 (two) times daily.     aspirin 81 MG chewable tablet  Chew 1 tablet (81 mg total) by mouth daily.     atorvastatin 40 MG tablet  Commonly known as:  LIPITOR  Take 1 tablet (40 mg  total) by mouth daily at 6 PM.     clopidogrel 75 MG tablet  Commonly known as:  PLAVIX  Take 1 tablet (75 mg total) by mouth daily.     finasteride 5 MG tablet  Commonly known as:  PROSCAR  Take 5 mg by mouth every morning.     gabapentin 300 MG capsule  Commonly known as:  NEURONTIN  Take 300 mg by mouth 3 (three) times daily.     HYDROcodone-acetaminophen 5-325 MG per tablet  Commonly known as:  NORCO/VICODIN  Take one tablet by mouth every four hours as needed for moderate pain     ipratropium-albuterol 0.5-2.5 (3) MG/3ML Soln  Commonly known as:  DUONEB  Take 3 mLs by nebulization 3 (three) times daily.     lubiprostone 24 MCG capsule  Commonly known as:  AMITIZA  Take 24 mcg by mouth 2 (two) times daily with a meal.     metoprolol 50 MG tablet  Commonly known as:  LOPRESSOR  Take 25 mg by mouth 2 (two) times daily.     nitroGLYCERIN 0.4 MG SL tablet   Commonly known as:  NITROSTAT  Place 1 tablet (0.4 mg total) under the tongue every 5 (five) minutes x 3 doses as needed for chest pain.     pantoprazole 40 MG tablet  Commonly known as:  PROTONIX  Take 1 tablet (40 mg total) by mouth daily.     tamsulosin 0.4 MG Caps capsule  Commonly known as:  FLOMAX  Take 1 capsule (0.4 mg total) by mouth daily.        No orders of the defined types were placed in this encounter.    There is no immunization history for the selected administration types on file for this patient.  History  Substance Use Topics  . Smoking status: Former Smoker -- 2.50 packs/day for 50 years    Types: Cigarettes    Quit date: 05/05/2005  . Smokeless tobacco: Former Systems developer    Quit date: 05/03/2012  . Alcohol Use: No     Comment: last use 2013    Family history is noncontributory    Review of Systems  DATA OBTAINED: from patient GENERAL:  no fevers,+ fatigue, appetite changes SKIN: No itching, rash or wounds EYES: No eye pain, redness, discharge EARS: No earache, tinnitus, change in hearing NOSE: No congestion, drainage or bleeding  MOUTH/THROAT: No mouth or tooth pain, No sore throat RESPIRATORY: No cough, wheezing, SOB CARDIAC: No chest pain, palpitations, lower extremity edema  GI: No abdominal pain, No N/V/D or constipation, No heartburn or reflux  GU: No dysuria, frequency or urgency, or incontinence  MUSCULOSKELETAL: No unrelieved bone/joint pain NEUROLOGIC: No headache, dizziness or focal weakness PSYCHIATRIC: No overt anxiety or sadness, No behavior issue.   Filed Vitals:   05/03/14 1942  BP: 147/80  Pulse: 78  Temp: 96.7 F (35.9 C)  Resp: 20    Physical Exam  GENERAL APPEARANCE: Alert, conversant,  No acute distress.  SKIN: No diaphoresis rash HEAD: Normocephalic, atraumatic  EYES: Conjunctiva/lids clear. Pupils round, reactive. EOMs intact.  EARS: External exam WNL, canals clear. Hearing grossly normal.  NOSE: No deformity or  discharge.  MOUTH/THROAT: Lips w/o lesions  RESPIRATORY: Breathing is even, unlabored. Lung sounds are clear   CARDIOVASCULAR: Heart RRR no murmurs, rubs or gallops. No peripheral edema.   GASTROINTESTINAL: Abdomen is soft, non-tender, not distended w/ normal bowel sounds. GENITOURINARY: Bladder non tender, not distended  MUSCULOSKELETAL: No abnormal joints or  musculature NEUROLOGIC:  Cranial nerves 2-12 grossly intact  PSYCHIATRIC: Mood and affect appropriate to situation, no behavioral issues  Patient Active Problem List   Diagnosis Date Noted  . Acute on chronic respiratory failure 05/08/2014  . Anemia, iron deficiency 05/08/2014  . Hyponatremia 05/08/2014  . Chronic respiratory failure with hypoxia 04/30/2014  . Shortness of breath   . Aspiration into airway   . HCAP (healthcare-associated pneumonia) 04/24/2014  . Hyperlipidemia LDL goal <70 04/20/2014  . AAA (abdominal aortic aneurysm), stable 04/20/2014  . Abdominal pain, generalized   . Chest pain   . SOB (shortness of breath)   . DOE (dyspnea on exertion), may be Brilinta this was stopped changed to Plavix 04/18/2014  . Peripheral vascular disease   . Presence of drug coated stent in left circumflex coronary artery: Promus DES 3.5 mm x 38 mm (3.9 mm) 01/17/2014    Class: Acute  . CAD (coronary artery disease), native coronary artery   . SVT (supraventricular tachycardia) s/p ablation 01/16/2014 01/16/2014  . 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  . Other and unspecified hyperlipidemia 09/07/2013  . TIA (transient ischemic attack) 09/06/2013  . HTN (hypertension) 09/05/2013  . TOBACCO ABUSE, hx 08/23/2007  . COPD with chronic bronchitis 08/12/2007  . 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 Reflex sympathetic dystrophy 05/29/2007  . GERD 05/29/2007    CBC    Component Value Date/Time   WBC 4.3 05/02/2014 0445   RBC 2.95* 05/02/2014 0445   HGB 8.3* 05/02/2014 0445   HCT 26.5* 05/02/2014 0445   PLT 347 05/02/2014  0445   MCV 89.8 05/02/2014 0445   LYMPHSABS 0.7 04/24/2014 1819   MONOABS 0.6 04/24/2014 1819   EOSABS 0.2 04/24/2014 1819   BASOSABS 0.0 04/24/2014 1819    CMP     Component Value Date/Time   NA 137 05/02/2014 0445   K 3.8 05/02/2014 0445   CL 100 05/02/2014 0445   CO2 31 05/02/2014 0445   GLUCOSE 140* 05/02/2014 0445   BUN 7 05/02/2014 0445   CREATININE 1.07 05/02/2014 0445   CALCIUM 8.9 05/02/2014 0445   PROT 7.6 04/24/2014 1819   ALBUMIN 3.8 04/24/2014 1819   AST 19 04/24/2014 1819   ALT 18 04/24/2014 1819   ALKPHOS 105 04/24/2014 1819   BILITOT 0.3 04/24/2014 1819   GFRNONAA 69* 05/02/2014 0445   GFRAA 80* 05/02/2014 0445    Assessment and Plan  Acute on chronic respiratory failure ? HCAP/aspiration (pt with known dx of COPD/asbestosis in 2005 by Dr. Annamaria Boots) - thought that spike in fever from aspiration PNA - completed Vanc day #7, maxipime completed for 4 days, Zosyn day #6 - stopped Vanc 12/27 - provide oxygen, BD' scheduled and as needed  - SLP requested, pt apparently not aspirating at this time and diet advanced to heart healthy  - switch to Augmentin upon discharge for 3 more days of therapy   HCAP (healthcare-associated pneumonia) ? HCAP/aspiration (pt with known dx of COPD/asbestosis in 2005 by Dr. Annamaria Boots) - thought that spike in fever from aspiration PNA - completed Vanc day #7, maxipime completed for 4 days, Zosyn day #6 - stopped Vanc 12/27 - provide oxygen, BD' scheduled and as needed  - SLP requested, pt apparently not aspirating at this time and diet advanced to heart healthy  - switch to Augmentin upon discharge for 3 more days of therapy   Anemia, iron deficiency - no signs of active bleeding, Hg stable at 8.3  Hyponatremia .likely pre renal, now better and  137 this AM  Hyperlipidemia LDL goal <70 Continue lipitor 40 mg; LDL 34, HDL 24  Presence of drug coated stent in left circumflex coronary artery: Promus DES 3.5 mm x 38 mm (3.9  mm) - on aspirin and plavix which can be continued upon discharge    CAD (coronary artery disease), native coronary artery - on aspirin and plavix which can be continued upon discharge    HTN (hypertension) Cont metoprolol and norvasc    Hennie Duos, MD

## 2014-05-05 ENCOUNTER — Encounter: Payer: Self-pay | Admitting: Internal Medicine

## 2014-05-05 ENCOUNTER — Non-Acute Institutional Stay (SKILLED_NURSING_FACILITY): Payer: Medicare Other | Admitting: Internal Medicine

## 2014-05-05 DIAGNOSIS — R339 Retention of urine, unspecified: Secondary | ICD-10-CM | POA: Diagnosis not present

## 2014-05-05 DIAGNOSIS — H409 Unspecified glaucoma: Secondary | ICD-10-CM

## 2014-05-05 DIAGNOSIS — M25569 Pain in unspecified knee: Secondary | ICD-10-CM | POA: Diagnosis not present

## 2014-05-05 DIAGNOSIS — F411 Generalized anxiety disorder: Secondary | ICD-10-CM | POA: Diagnosis not present

## 2014-05-05 DIAGNOSIS — D509 Iron deficiency anemia, unspecified: Secondary | ICD-10-CM | POA: Diagnosis not present

## 2014-05-05 DIAGNOSIS — J189 Pneumonia, unspecified organism: Secondary | ICD-10-CM

## 2014-05-05 DIAGNOSIS — E871 Hypo-osmolality and hyponatremia: Secondary | ICD-10-CM

## 2014-05-05 HISTORY — DX: Unspecified glaucoma: H40.9

## 2014-05-05 NOTE — Progress Notes (Signed)
Patient ID: [name], male   DOB: 1945/08/05, 69 y.o.   MRN: [id number] MRN: [id number] Name: Curtis [name]  Sex: male Age: 69 y.o. DOB: 09-Sep-1945  [place] #:  Facility/Room: Level Of Care: SNF Provider: Wille Celeste Emergency Contacts: Extended Emergency Contact Information Primary Emergency Contact: Strong,Mark Address: [place]          [address] [place] Phone: [phone] Mobile Phone: [phone] Relation: Son  Code Status:   Allergies: Codeine  No chief complaint on file.   HPI: Patient is 69 y.o. male who  Past Medical History  Diagnosis Date  . Arthritis   . SOB (shortness of breath)   . Pulmonary asbestosis   . Hernia of unspecified site of abdominal cavity without mention of obstruction or gangrene     hiatal  . GERD (gastroesophageal reflux disease)   . Depression   . Diverticulitis     hospital 2011 [facility]  . Diverticulosis   . Reflex sympathetic dystrophy   . 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 Hypertension   . IBS (irritable bowel syndrome)   . Peripheral vascular disease     ankle brachial index of 0.79 on the right and 0.61 on the left.   . Myocardial infarction 01/2014    NSTEMI  . Hyperlipidemia LDL goal <70 04/20/2014  . AAA (abdominal aortic aneurysm), stable 04/20/2014         This encounter was created in error - please disregard.

## 2014-05-08 ENCOUNTER — Encounter: Payer: Self-pay | Admitting: Internal Medicine

## 2014-05-08 DIAGNOSIS — D509 Iron deficiency anemia, unspecified: Secondary | ICD-10-CM | POA: Insufficient documentation

## 2014-05-08 DIAGNOSIS — E871 Hypo-osmolality and hyponatremia: Secondary | ICD-10-CM | POA: Insufficient documentation

## 2014-05-08 DIAGNOSIS — J9620 Acute and chronic respiratory failure, unspecified whether with hypoxia or hypercapnia: Secondary | ICD-10-CM | POA: Insufficient documentation

## 2014-05-08 DIAGNOSIS — R339 Retention of urine, unspecified: Secondary | ICD-10-CM | POA: Insufficient documentation

## 2014-05-08 NOTE — Assessment & Plan Note (Signed)
-   on aspirin and plavix which can be continued upon discharge

## 2014-05-08 NOTE — Progress Notes (Signed)
Patient ID: Curtis [name], male   DOB: [birth year], 69 y.o.   MRN: [id number]   the date is 05/05/2014.  Level care skilled.  [place].  Chief complaint-acute visit secondary to urinary retention-generalized pain-anxiety  HPI: Patient is 69 y.o. male who who was hosp for HCAP a week after getting a stent placed in circumflex. Pt is admitted for OT/PT--he also has a history of urinary retention have a Foley catheter which is since been removed-however patient apparently is retaining urine is complaining of some suprapubic distention and discomfort.  He also has a history of anxiety on Xanax 0.5 mg twice a day he would like a when necessary dose -for breakthrough anxiety.  Is also complaining of some leg pain he is on Vicodin 09/05/2023 milligrams one tablet every 4 hours-per discussion with patient he ihas had chronic leg pain for some time this has been a long-term situation--apparently has a fairly significant history of peripheral vascular disease  Respiratory wise he does not complain of any increased cough or shortness of breath.  Vital signs appear to be stable.  Past Medical History  Diagnosis Date  . Arthritis   . SOB (shortness of breath)   . Pulmonary asbestosis   . Hernia of unspecified site of abdominal cavity without mention of obstruction or gangrene     hiatal  . GERD (gastroesophageal reflux disease)   . Depression   . Diverticulitis     hospital 2011 [facility]  . Diverticulosis   . Reflex sympathetic dystrophy   . 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 Hypertension   . IBS (irritable bowel syndrome)   . Peripheral vascular disease     ankle brachial index of 0.79 on the right and 0.61 on the left.   . Myocardial infarction 01/2014    NSTEMI  . Hyperlipidemia LDL goal <70 04/20/2014  . AAA (abdominal aortic aneurysm), stable 04/20/2014    Past Surgical History  Procedure Laterality Date  . Nissen fundoplication    . Right elbow surgery      x 2  . Right knee  arthroscopy    . Left shoulder       x 3  . Squamous cell skin cancer      Left Hand  . Cataract extraction    . Supraventricular tachycardia ablation N/A 01/16/2014    Procedure: SUPRAVENTRICULAR TACHYCARDIA ABLATION;  Surgeon: Evans Lance, MD;  Location: [facility] CATH LAB;  Service: Cardiovascular;  Laterality: N/A;  . Left heart catheterization with coronary angiogram N/A 01/17/2014    Procedure: LEFT HEART CATHETERIZATION WITH CORONARY ANGIOGRAM;  Surgeon: Leonie Man, MD;  Location: [facility] CATH LAB;  Service: Cardiovascular;  Laterality: N/A;      Medication List                       albuterol (2.5 MG/3ML) 0.083% nebulizer solution  Commonly known as:  PROVENTIL  Take 3 mLs (2.5 mg total) by nebulization every 3 (three) hours as needed for wheezing or shortness of breath.     alprazolam 2 MG tablet  Commonly known as:  XANAX  Take 1 tablet (2 mg total) by mouth 2 (two) times daily.     amitriptyline 75 MG tablet  Commonly known as:  ELAVIL  Take 75 mg by mouth at bedtime.     amLODipine 2.5 MG tablet  Commonly known as:  NORVASC  Take 1 tablet (2.5 mg total) by mouth daily.     amoxicillin-clavulanate 875-125  MG per tablet  Commonly known as:  AUGMENTIN  Take 1 tablet by mouth 2 (two) times daily.     aspirin 81 MG chewable tablet  Chew 1 tablet (81 mg total) by mouth daily.     atorvastatin 40 MG tablet  Commonly known as:  LIPITOR  Take 1 tablet (40 mg total) by mouth daily at 6 PM.     clopidogrel 75 MG tablet  Commonly known as:  PLAVIX  Take 1 tablet (75 mg total) by mouth daily.     finasteride 5 MG tablet  Commonly known as:  PROSCAR  Take 5 mg by mouth every morning.     gabapentin 300 MG capsule  Commonly known as:  NEURONTIN  Take 300 mg by mouth 3 (three) times daily.     HYDROcodone-acetaminophen 5-325 MG per tablet  Commonly known as:  NORCO/VICODIN  Take one tablet by mouth every four hours as needed for moderate pain      ipratropium-albuterol 0.5-2.5 (3) MG/3ML Soln  Commonly known as:  DUONEB  Take 3 mLs by nebulization 3 (three) times daily.     lubiprostone 24 MCG capsule  Commonly known as:  AMITIZA  Take 24 mcg by mouth 2 (two) times daily with a meal.     metoprolol 50 MG tablet  Commonly known as:  LOPRESSOR  Take 25 mg by mouth 2 (two) times daily.     nitroGLYCERIN 0.4 MG SL tablet  Commonly known as:  NITROSTAT  Place 1 tablet (0.4 mg total) under the tongue every 5 (five) minutes x 3 doses as needed for chest pain.     pantoprazole 40 MG tablet  Commonly known as:  PROTONIX  Take 1 tablet (40 mg total) by mouth daily.     tamsulosin 0.4 MG Caps capsule  Commonly known as:  FLOMAX  Take 1 capsule (0.4 mg total) by mouth daily.        No orders of the defined types were placed in this encounter.    There is no immunization history for the selected administration types on file for this patient.  History  Substance Use Topics  . Smoking status: Former Smoker -- 2.50 packs/day for 50 years    Types: Cigarettes    Quit date: 05/05/2005  . Smokeless tobacco: Former Systems developer    Quit date: 05/03/2012  . Alcohol Use: No     Comment: last use 2013    Family history is noncontributory    Review of Systems  DATA OBTAINED: from patient GENERAL:  no fevers,+ fatigue, appetite changes SKIN: No itching, rash or wounds EYES: No eye pain, redness, discharge EARS: No earache, tinnitus, change in hearing NOSE: No congestion, drainage or bleeding  MOUTH/THROAT: No mouth or tooth pain, No sore throat RESPIRATORY: No cough, wheezing, SOB CARDIAC: No chest pain, palpitations, lower extremity edema  GI: No abdominal pain, No N/V/D or constipation, No heartburn or reflux  GU: History of urinary retention-has some bladder distention and discomfort-does not complain of burning with urination  MUSCULOSKELETAL: Complains of chronic l leg pain NEUROLOGIC: No headache, dizziness or focal  weakness PSYCHIATRIC: No overt anxiety or sadness, No behavior issue.                       Physical Exam Temperature 98.0 pulse 74 respirations 18 blood pressure 121/73 GENERAL APPEARANCE: Alert, conversant,  No acute distress.  SKIN: No diaphoresis rash HEAD: Normocephalic, atraumatic  EYES: Conjunctiva/lids clear. Pupils round,  reactive. EOMs intact.  EARS: External exam WNL, canals clear. Hearing grossly normal.  NOSE: No deformity or discharge.  MOUTH/THROAT: Lips w/o lesions  RESPIRATORY: Has slightlyt congested breath sounds that clear with cough-per nursing this has been his baseline as there is no labored breathing   CARDIOVASCULAR: Heart RRR no murmurs, rubs or gallops. No peripheral edema.   GASTROINTESTINAL: Abdomen is soft, non-tender, not distended w/ normal bowel sounds. GENITOURINARY: Appears to have some mild bladder distention and discomfort-  MUSCULOSKELETAL: No abnormal joints or musculature--does have lower extremity weakness I do not note any acute leg deformity or increased erythema or edema NEUROLOGIC:  Cranial nerves 2-12 grossly intact  PSYCHIATRIC: Mood and affect appropriate to situation, no behavioral issues  Patient Active Problem List   Diagnosis Date Noted  . Acute on chronic respiratory failure 05/08/2014  . Anemia, iron deficiency 05/08/2014  . Hyponatremia 05/08/2014  . Chronic respiratory failure with hypoxia 04/30/2014  . Shortness of breath   . Aspiration into airway   . HCAP (healthcare-associated pneumonia) 04/24/2014  . Hyperlipidemia LDL goal <70 04/20/2014  . AAA (abdominal aortic aneurysm), stable 04/20/2014  . Abdominal pain, generalized   . Chest pain   . SOB (shortness of breath)   . DOE (dyspnea on exertion), may be Brilinta this was stopped changed to Plavix 04/18/2014  . Peripheral vascular disease   . Presence of drug coated stent in left circumflex coronary artery: Promus DES 3.5 mm x 38 mm (3.9 mm) 01/17/2014    Class:  Acute  . CAD (coronary artery disease), native coronary artery   . SVT (supraventricular tachycardia) s/p ablation 01/16/2014 01/16/2014  . 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  . Other and unspecified hyperlipidemia 09/07/2013  . TIA (transient ischemic attack) 09/06/2013  . HTN (hypertension) 09/05/2013  . TOBACCO ABUSE, hx 08/23/2007  . COPD with chronic bronchitis 08/12/2007  . 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 Reflex sympathetic dystrophy 05/29/2007  . GERD 05/29/2007    CBC    Component Value Date/Time   WBC 4.3 05/02/2014 0445   RBC 2.95* 05/02/2014 0445   HGB 8.3* 05/02/2014 0445   HCT 26.5* 05/02/2014 0445   PLT 347 05/02/2014 0445   MCV 89.8 05/02/2014 0445   LYMPHSABS 0.7 04/24/2014 1819   MONOABS 0.6 04/24/2014 1819   EOSABS 0.2 04/24/2014 1819   BASOSABS 0.0 04/24/2014 1819    CMP     Component Value Date/Time   NA 137 05/02/2014 0445   K 3.8 05/02/2014 0445   CL 100 05/02/2014 0445   CO2 31 05/02/2014 0445   GLUCOSE 140* 05/02/2014 0445   BUN 7 05/02/2014 0445   CREATININE 1.07 05/02/2014 0445   CALCIUM 8.9 05/02/2014 0445   PROT 7.6 04/24/2014 1819   ALBUMIN 3.8 04/24/2014 1819   AST 19 04/24/2014 1819   ALT 18 04/24/2014 1819   ALKPHOS 105 04/24/2014 1819   BILITOT 0.3 04/24/2014 1819   GFRNONAA 69* 05/02/2014 0445   GFRAA 80* 05/02/2014 0445    Assessment and Plan  Acute on chronic respiratory failure ? HCAP/aspiration (pt with known dx of COPD/asbestosis in 2005 by Dr. Annamaria Boots) - thought that spike in fever from aspiration PNA - completed Vanc day #7, maxipime completed for 4 days, Zosyn day #6 - stopped Vanc 12/27 - provide oxygen, BD' scheduled and as needed  - SLP requested, pt apparently not aspirating at this time and diet advanced to heart healthy  - switch to Augmentin upon discharge for 3 more days of  therapy .  Clinically this appears to be stable History of urinary retention-will restart Foley catheter for now and  monitor this has been discussed with nursing.  Anxiety-will add a when necessary Xanax 0.5 mg daily he continues on 2 mg twice a day as well.  Pain management -spoke with nursing he already has an order that he can receive 2 Vicodin if needed for severe pain this was discussed with nursing as well as with patient-advised patient he can ask for 2      Anemia, iron deficiency - no signs of active bleeding, Hg stable at 8.3- update CBC next lab day  Hyponatremia .likely pre renal, now better and 137 this AM--update BMP next lab day   Hyperlipidemia LDL goal <70 Continue lipitor 40 mg; LDL 34, HDL 24  Presence of drug coated stent in left circumflex coronary artery: Promus DES 3.5 mm x 38 mm (3.9 mm) - on aspirin and plavix which can be continued upon discharge    CAD (coronary artery disease), native coronary artery - on aspirin and plavix which can be continued upon discharge    HTN (hypertension) Cont metoprolol and norvasc  [id number]

## 2014-05-08 NOTE — Assessment & Plan Note (Signed)
?   HCAP/aspiration (pt with known dx of COPD/asbestosis in 2005 by Dr. Annamaria Boots) - thought that spike in fever from aspiration PNA - completed Vanc day #7, maxipime completed for 4 days, Zosyn day #6 - stopped Vanc 12/27 - provide oxygen, BD' scheduled and as needed  - SLP requested, pt apparently not aspirating at this time and diet advanced to heart healthy  - switch to Augmentin upon discharge for 3 more days of therapy

## 2014-05-08 NOTE — Assessment & Plan Note (Signed)
Cont metoprolol and norvasc

## 2014-05-08 NOTE — Assessment & Plan Note (Signed)
.  likely pre renal, now better and 137 this AM

## 2014-05-08 NOTE — Assessment & Plan Note (Signed)
Continue lipitor 40 mg; LDL 34, HDL 24

## 2014-05-08 NOTE — Assessment & Plan Note (Signed)
-   no signs of active bleeding, Hg stable at 8.3

## 2014-05-18 ENCOUNTER — Non-Acute Institutional Stay (SKILLED_NURSING_FACILITY): Payer: Medicare Other | Admitting: Internal Medicine

## 2014-05-18 DIAGNOSIS — R339 Retention of urine, unspecified: Secondary | ICD-10-CM

## 2014-05-18 DIAGNOSIS — I2510 Atherosclerotic heart disease of native coronary artery without angina pectoris: Secondary | ICD-10-CM

## 2014-05-18 DIAGNOSIS — J449 Chronic obstructive pulmonary disease, unspecified: Secondary | ICD-10-CM

## 2014-05-18 DIAGNOSIS — I10 Essential (primary) hypertension: Secondary | ICD-10-CM

## 2014-05-18 DIAGNOSIS — J9620 Acute and chronic respiratory failure, unspecified whether with hypoxia or hypercapnia: Secondary | ICD-10-CM

## 2014-05-18 NOTE — Progress Notes (Signed)
Patient ID: Curtis [name], male   DOB: [birth year], 69 y.o.   MRN: [id number]         this is a discharge note  Level care skilled.  [place].  Chief complaint-discharge note  HPI: Patient is 69 y.o. male who who was hosp for HCAP a week after getting a stent placed in circumflex. was admitted for OT/PT--he also has a history of urinary retentiondid have a Foley catheter which is since been removed--he continues on Proscar and Flomax for a history BPH-this will need follow-up on discharge and his primary care provider apparently was doing prior to his hospitalization .  He also has a history of anxiety on Xanax 0.5 mg twice a day as well as 0.25 mg for breakthrough anxiety this appears to be effective  He continues on Vicodin for breakthrough pain mainly in his legs he does have a history of  chronic leg pain for some time this has been a long-term situation--apparently has a fairly significant history of peripheral vascular disease  Respiratory wise he does not complain of any increased cough or shortness of breath.  Vital signs appear to be stable.  He does not complain of difficulty urinating currently again though this has been somewhat of a chronic intermittent issue  He will be going home he does have a very supportive son who lives next door-he will need continued PT and OT for strengthening appears to be doing fairly well with a walker but is still somewhat weak certainly with his extensive history-will need nursing support for follow up his multiple medical issues as well  Past Medical History  Diagnosis Date  . Arthritis   . SOB (shortness of breath)   . Pulmonary asbestosis   . Hernia of unspecified site of abdominal cavity without mention of obstruction or gangrene     hiatal  . GERD (gastroesophageal reflux disease)   . Depression   . Diverticulitis     hospital 2011 [facility]  . Diverticulosis   . Reflex sympathetic dystrophy   . 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  Hypertension   . IBS (irritable bowel syndrome)   . Peripheral vascular disease     ankle brachial index of 0.79 on the right and 0.61 on the left.   . Myocardial infarction 01/2014    NSTEMI  . Hyperlipidemia LDL goal <70 04/20/2014  . AAA (abdominal aortic aneurysm), stable 04/20/2014    Past Surgical History  Procedure Laterality Date  . Nissen fundoplication    . Right elbow surgery      x 2  . Right knee arthroscopy    . Left shoulder       x 3  . Squamous cell skin cancer      Left Hand  . Cataract extraction    . Supraventricular tachycardia ablation N/A 01/16/2014    Procedure: SUPRAVENTRICULAR TACHYCARDIA ABLATION;  Surgeon: Evans Lance, MD;  Location: [facility] CATH LAB;  Service: Cardiovascular;  Laterality: N/A;  . Left heart catheterization with coronary angiogram N/A 01/17/2014    Procedure: LEFT HEART CATHETERIZATION WITH CORONARY ANGIOGRAM;  Surgeon: Leonie Man, MD;  Location: [facility] CATH LAB;  Service: Cardiovascular;  Laterality: N/A;      Medication List                       albuterol (2.5 MG/3ML) 0.083% nebulizer solution  Commonly known as:  PROVENTIL  Take 3 mLs (2.5 mg total) by nebulization every 3 (three)  hours as needed for wheezing or shortness of breath.     alprazolam 2 MG tablet  Commonly known as:  XANAX  Take 1 tablet (2 mg total) by mouth 2 (two) times daily.     amitriptyline 75 MG tablet  Commonly known as:  ELAVIL  Take 75 mg by mouth at bedtime.     amLODipine 2.5 MG tablet  Commonly known as:  NORVASC  Take 1 tablet (2.5 mg total) by mouth daily.               aspirin 81 MG chewable tablet  Chew 1 tablet (81 mg total) by mouth daily.     atorvastatin 40 MG tablet  Commonly known as:  LIPITOR  Take 1 tablet (40 mg total) by mouth daily at 6 PM.     clopidogrel 75 MG tablet  Commonly known as:  PLAVIX  Take 1 tablet (75 mg total) by mouth daily.     finasteride 5 MG tablet  Commonly known as:  PROSCAR  Take 5 mg by  mouth every morning.     gabapentin 300 MG capsule  Commonly known as:  NEURONTIN  Take 300 mg by mouth 3 (three) times daily.     HYDROcodone-acetaminophen 5-325 MG per tablet  Commonly known as:  NORCO/VICODIN  Take one tablet by mouth every four hours as needed for moderate pain     ipratropium-albuterol 0.5-2.5 (3) MG/3ML Soln  Commonly known as:  DUONEB  Take 3 mLs by nebulization 3 (three) times daily.     lubiprostone 24 MCG capsule  Commonly known as:  AMITIZA  Take 24 mcg by mouth 2 (two) times daily with a meal.     metoprolol 50 MG tablet  Commonly known as:  LOPRESSOR  Take 25 mg by mouth 2 (two) times daily.     nitroGLYCERIN 0.4 MG SL tablet  Commonly known as:  NITROSTAT  Place 1 tablet (0.4 mg total) under the tongue every 5 (five) minutes x 3 doses as needed for chest pain.     pantoprazole 40 MG tablet  Commonly known as:  PROTONIX  Take 1 tablet (40 mg total) by mouth daily.     tamsulosin 0.4 MG Caps capsule  Commonly known as:  FLOMAX  Take 1 capsule (0.4 mg total) by mouth daily.        No orders of the defined types were placed in this encounter.    There is no immunization history for the selected administration types on file for this patient.  History  Substance Use Topics  . Smoking status: Former Smoker -- 2.50 packs/day for 50 years    Types: Cigarettes    Quit date: 05/05/2005  . Smokeless tobacco: Former Systems developer    Quit date: 05/03/2012  . Alcohol Use: No     Comment: last use 2013    Family history is noncontributory    Review of Systems  DATA OBTAINED: from patientt GENERAL:  no fevers, fatigue, appetite changes SKIN: No itching, rash or wounds EYES: No eye pain, redness, discharge EARS: No earache, tinnitus, change in hearing NOSE: No congestion, drainage or bleeding  MOUTH/THROAT: No mouth or tooth pain, No sore throat RESPIRATORY: No cough, wheezing, SOB CARDIAC: No chest pain, palpitations, lower extremity edema  GI:  No abdominal pain, No N/V/D or constipation, No heartburn or reflux  GU: History of urinary retention--again catheter has been removed he appears to be doing okay with this but will need follow-up-does not  complain of dysuria  MUSCULOSKELETAL: Complains of chronic l leg pain--somewhat better controlled than when I saw him earlier this month NEUROLOGIC: No headache, dizziness or focal weakness PSYCHIATRIC: No overt anxiety or sadness, No behavior issue. History of anxiety which appears controlled                       Physical Exam  temperature 99.4 pulse 60 respirations 16 blood pressure 139/73 these appear relatively baseline GENERAL APPEARANCE: Alert, conversant,  No acute distress.  SKIN: No diaphoresis rash HEAD: Normocephalic, atraumatic  EYES: Conjunctiva/lids clear. Pupils round, reactive. EOMs intact.  EARS: External exam WNL, canals clear. Hearing grossly normal.  NOSE: No deformity or discharge.  MOUTH/THROAT: Lips w/o lesions  RESPIRATORY: Somewhat shallow air entry-there is no labored breathing or chest congestion noted   CARDIOVASCULAR: Heart RRR no murmurs, rubs or gallops. No peripheral edema.   GASTROINTESTINAL: Abdomen is soft, non-tender, not distended w/ normal bowel sounds. GENITOURINARY: Could not really appreciate suprapubic tenderness or distention-catheters been removed-  MUSCULOSKELETAL: No abnormal joints or musculature--does have lower extremity weakness I do not note any acute leg deformity or increased erythema or edema--appears to be ambulating fairly well with a walker NEUROLOGIC:  Cranial nerves 2-12 grossly intact  PSYCHIATRIC: Mood and affect appropriate to situation, no behavioral issues  Patient Active Problem List   Diagnosis Date Noted  . Acute on chronic respiratory failure 05/08/2014  . Anemia, iron deficiency 05/08/2014  . Hyponatremia 05/08/2014  . Chronic respiratory failure with hypoxia 04/30/2014  . Shortness of breath   .  Aspiration into airway   . HCAP (healthcare-associated pneumonia) 04/24/2014  . Hyperlipidemia LDL goal <70 04/20/2014  . AAA (abdominal aortic aneurysm), stable 04/20/2014  . Abdominal pain, generalized   . Chest pain   . SOB (shortness of breath)   . DOE (dyspnea on exertion), may be Brilinta this was stopped changed to Plavix 04/18/2014  . Peripheral vascular disease   . Presence of drug coated stent in left circumflex coronary artery: Promus DES 3.5 mm x 38 mm (3.9 mm) 01/17/2014    Class: Acute  . CAD (coronary artery disease), native coronary artery   . SVT (supraventricular tachycardia) s/p ablation 01/16/2014 01/16/2014  . 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  . Other and unspecified hyperlipidemia 09/07/2013  . TIA (transient ischemic attack) 09/06/2013  . HTN (hypertension) 09/05/2013  . TOBACCO ABUSE, hx 08/23/2007  . COPD with chronic bronchitis 08/12/2007  . 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 Reflex sympathetic dystrophy 05/29/2007  . GERD 05/29/2007     Labs.  05/06/2014.  Sodium 136 potassium 4.4 BUN 12 creatinine 1.0.  WBC 8.7 hemoglobin 9.4 platelets 526   Component Value Date/Time   WBC 4.3 05/02/2014 0445   RBC 2.95* 05/02/2014 0445   HGB 8.3* 05/02/2014 0445   HCT 26.5* 05/02/2014 0445   PLT 347 05/02/2014 0445   MCV 89.8 05/02/2014 0445   LYMPHSABS 0.7 04/24/2014 1819   MONOABS 0.6 04/24/2014 1819   EOSABS 0.2 04/24/2014 1819   BASOSABS 0.0 04/24/2014 1819    CMP     Component Value Date/Time   NA 137 05/02/2014 0445   K 3.8 05/02/2014 0445   CL 100 05/02/2014 0445   CO2 31 05/02/2014 0445   GLUCOSE 140* 05/02/2014 0445   BUN 7 05/02/2014 0445   CREATININE 1.07 05/02/2014 0445   CALCIUM 8.9 05/02/2014 0445   PROT 7.6 04/24/2014 1819   ALBUMIN 3.8 04/24/2014 1819   AST  19 04/24/2014 1819   ALT 18 04/24/2014 1819   ALKPHOS 105 04/24/2014 1819   BILITOT 0.3 04/24/2014 1819   GFRNONAA 69* 05/02/2014 0445   GFRAA 80* 05/02/2014  0445    Assessment and Plan  Acute on chronic respiratory failure ? HCAP/aspiration (pt with known dx of COPD/asbestosis in 2005 by Dr. Annamaria Boots) - thought that spike in fever from aspiration PNA - completed Vanc day #7, maxipime completed for 4 days, Zosyn completed as well--has completed a course of Augmentin here - stopped Vanc 12/27 This appears stable - .   History of urinary retention-this has been a chronic situation-she is on Proscar Flomax-follow-up with primary care provider and I suspect urology  Anxiety-stable on when necessary Xanax 0.5 mg daily he continues on 2 mg twice a day as well.  Pain management - At this point relatively well controlled on Vicodin when necessary      Anemia, iron deficiency - no signs of active bleeding, Hg on January 2 showed improvement at 9.4 on hospital discharge was 8.3  Hyponatremia .likely pre renal, now better and 136 on most recent lab January 2   Hyperlipidemia LDL goal <70 Continue lipitor 40 mg; LDL 34, HDL 24  Presence of drug coated stent in left circumflex coronary artery: Promus DES 3.5 mm x 38 mm (3.9 mm) - on aspirin and Plavix    CAD (coronary artery disease), native coronary artery - on aspirin and plavix -this appears to be stable    HTN (hypertension) Cont metoprolol and norvasc--blood pressures appear relatively well-controlled a do note some systolics in the 381R but this is not consistent   Patient will be going home-he will be continued PT and OT for strengthening as well as nursing report follow-up his multiple medical issues-also will need expedient primary care follow-up-I suspect urology follow-up as well.  Also will check a CBC with platelets and metabolic panel before discharge    [phone] of note greater than 30 minutes spent on this discharge summary-prescriptions have been written

## 2014-06-10 ENCOUNTER — Other Ambulatory Visit: Payer: Self-pay | Admitting: Cardiovascular Disease

## 2014-09-12 ENCOUNTER — Encounter (HOSPITAL_COMMUNITY): Payer: Medicare Other

## 2014-10-11 ENCOUNTER — Telehealth: Payer: Self-pay | Admitting: Cardiology

## 2014-10-11 NOTE — Telephone Encounter (Signed)
Pt called complaining of joint pain. He also says he doesn't think he needs Protonix. I suggested he stop his statin for a week and call us back and let us know how he is doing. I also told him he could stop his Protonix if he felt he didn't need it.  Kerin Ransom PA-C 10/11/2014 5:18 PM

## 2014-10-18 ENCOUNTER — Telehealth: Payer: Self-pay | Admitting: Cardiology

## 2014-10-18 ENCOUNTER — Other Ambulatory Visit: Payer: Self-pay | Admitting: *Deleted

## 2014-10-18 MED ORDER — ATORVASTATIN CALCIUM 40 MG PO TABS: 40.0000 mg | ORAL_TABLET | Freq: Every day | ORAL | Status: DC

## 2014-10-18 MED ORDER — CLOPIDOGREL BISULFATE 75 MG PO TABS: 75.0000 mg | ORAL_TABLET | Freq: Every day | ORAL | Status: DC

## 2014-10-18 NOTE — Telephone Encounter (Signed)
Forward to [facility] [address] [facility] Patient initial was seen at the [address] [facility]

## 2014-10-18 NOTE — Telephone Encounter (Signed)
°  1. Which medications need to be refilled? Atorvastatin and Clopidogrel  2. Which pharmacy is medication to be sent to?[facility] 3. Do they need a 30 day or 90 day supply? 90 and refills  4. Would they like a call back once the medication has been sent to the pharmacy? yes

## 2014-10-18 NOTE — Telephone Encounter (Signed)
Spoke with patient who states he was advised to stop Atorvastatin 1 week ago for joint pain and to call back to report how he was feeling.  Patient reports no improvement in joint pain since stopping the medication.  States he would like to resume taking Atorvastatin and needs a refill; states also needs refill on Plavix .  I asked patient who he considers his primary cardiologist because there are numerous physicians' names on his records and prescriptions.  Patient states he considers Dr. Lovena Le primary cardiologist.  I sent refills for Plavix and Atorvastatin to [facility] per patient request.

## 2014-10-18 NOTE — Telephone Encounter (Signed)
Pt started taking Atorvastatin 10-11-14. He was told to call back in a week and tell how he was doing with the new medicine.He said it did not make any difference.He is still having joint painis

## 2014-11-09 ENCOUNTER — Encounter (HOSPITAL_COMMUNITY): Payer: Self-pay | Admitting: Emergency Medicine

## 2014-11-09 ENCOUNTER — Emergency Department (HOSPITAL_COMMUNITY): Payer: Medicare Other

## 2014-11-09 ENCOUNTER — Emergency Department (HOSPITAL_COMMUNITY)
Admission: EM | Admit: 2014-11-09 | Discharge: 2014-11-10 | Disposition: A | Discharge status: Home / Self Care | Payer: Medicare Other | Attending: Emergency Medicine | Admitting: Emergency Medicine

## 2014-11-09 DIAGNOSIS — Z8709 Personal history of other diseases of the respiratory system: Secondary | ICD-10-CM | POA: Insufficient documentation

## 2014-11-09 DIAGNOSIS — Z7951 Long term (current) use of inhaled steroids: Secondary | ICD-10-CM | POA: Diagnosis not present

## 2014-11-09 DIAGNOSIS — I10 Essential (primary) hypertension: Secondary | ICD-10-CM | POA: Insufficient documentation

## 2014-11-09 DIAGNOSIS — Z87891 Personal history of nicotine dependence: Secondary | ICD-10-CM | POA: Diagnosis not present

## 2014-11-09 DIAGNOSIS — R1084 Generalized abdominal pain: Secondary | ICD-10-CM | POA: Diagnosis present

## 2014-11-09 DIAGNOSIS — M1990 Unspecified osteoarthritis, unspecified site: Secondary | ICD-10-CM | POA: Diagnosis not present

## 2014-11-09 DIAGNOSIS — K589 Irritable bowel syndrome without diarrhea: Secondary | ICD-10-CM | POA: Insufficient documentation

## 2014-11-09 DIAGNOSIS — F329 Major depressive disorder, single episode, unspecified: Secondary | ICD-10-CM | POA: Diagnosis not present

## 2014-11-09 DIAGNOSIS — K5900 Constipation, unspecified: Secondary | ICD-10-CM | POA: Diagnosis not present

## 2014-11-09 DIAGNOSIS — Z79899 Other long term (current) drug therapy: Secondary | ICD-10-CM | POA: Diagnosis not present

## 2014-11-09 DIAGNOSIS — K219 Gastro-esophageal reflux disease without esophagitis: Secondary | ICD-10-CM | POA: Insufficient documentation

## 2014-11-09 DIAGNOSIS — Z7982 Long term (current) use of aspirin: Secondary | ICD-10-CM | POA: Diagnosis not present

## 2014-11-09 DIAGNOSIS — Z8669 Personal history of other diseases of the nervous system and sense organs: Secondary | ICD-10-CM | POA: Diagnosis not present

## 2014-11-09 DIAGNOSIS — I252 Old myocardial infarction: Secondary | ICD-10-CM | POA: Diagnosis not present

## 2014-11-09 DIAGNOSIS — Z7902 Long term (current) use of antithrombotics/antiplatelets: Secondary | ICD-10-CM | POA: Insufficient documentation

## 2014-11-09 DIAGNOSIS — R1013 Epigastric pain: Secondary | ICD-10-CM | POA: Diagnosis not present

## 2014-11-09 DIAGNOSIS — Z9889 Other specified postprocedural states: Secondary | ICD-10-CM | POA: Diagnosis not present

## 2014-11-09 DIAGNOSIS — E785 Hyperlipidemia, unspecified: Secondary | ICD-10-CM | POA: Diagnosis not present

## 2014-11-09 LAB — CBC
HEMATOCRIT: 36.8 % — AB (ref 39.0–52.0)
HEMOGLOBIN: 12.5 g/dL — AB (ref 13.0–17.0)
MCH: 30 pg (ref 26.0–34.0)
MCHC: 34 g/dL (ref 30.0–36.0)
MCV: 88.2 fL (ref 78.0–100.0)
Platelets: 262 10*3/uL (ref 150–400)
RBC: 4.17 MIL/uL — ABNORMAL LOW (ref 4.22–5.81)
RDW: 14.2 % (ref 11.5–15.5)
WBC: 5.5 10*3/uL (ref 4.0–10.5)

## 2014-11-09 LAB — COMPREHENSIVE METABOLIC PANEL
ALT: 37 U/L (ref 17–63)
AST: 32 U/L (ref 15–41)
Albumin: 3.9 g/dL (ref 3.5–5.0)
Alkaline Phosphatase: 72 U/L (ref 38–126)
Anion gap: 11 (ref 5–15)
BUN: 14 mg/dL (ref 6–20)
CO2: 26 mmol/L (ref 22–32)
CREATININE: 1.25 mg/dL — AB (ref 0.61–1.24)
Calcium: 10.2 mg/dL (ref 8.9–10.3)
Chloride: 96 mmol/L — ABNORMAL LOW (ref 101–111)
GFR calc Af Amer: >60 mL/min (ref >60)
GFR, EST NON AFRICAN AMERICAN: 57 mL/min — AB (ref >60)
GLUCOSE: 97 mg/dL (ref 65–99)
Potassium: 4 mmol/L (ref 3.5–5.1)
Sodium: 133 mmol/L — ABNORMAL LOW (ref 135–145)
TOTAL PROTEIN: 8 g/dL (ref 6.5–8.1)
Total Bilirubin: 0.5 mg/dL (ref 0.3–1.2)

## 2014-11-09 LAB — I-STAT CG4 LACTIC ACID, ED: LACTIC ACID, VENOUS: 2.17 mmol/L — AB (ref 0.5–2.0)

## 2014-11-09 LAB — LIPASE, BLOOD: Lipase: 19 U/L — ABNORMAL LOW (ref 22–51)

## 2014-11-09 MED ORDER — SODIUM CHLORIDE 0.9 % IV BOLUS (SEPSIS)
1000.0000 mL | Freq: Once | INTRAVENOUS | Status: AC
  Administered 2014-11-09: 1000 mL via INTRAVENOUS

## 2014-11-09 MED ORDER — GI COCKTAIL ~~LOC~~
30.0000 mL | Freq: Once | ORAL | Status: AC
  Administered 2014-11-09: 30 mL via ORAL
  Filled 2014-11-09: qty 30

## 2014-11-09 MED ORDER — IOHEXOL 300 MG/ML  SOLN
25.0000 mL | Freq: Once | INTRAMUSCULAR | Status: AC | PRN
  Administered 2014-11-09: 25 mL via ORAL

## 2014-11-09 MED ORDER — IOHEXOL 300 MG/ML  SOLN
100.0000 mL | Freq: Once | INTRAMUSCULAR | Status: AC | PRN
  Administered 2014-11-09: 100 mL via INTRAVENOUS

## 2014-11-09 MED ORDER — HYDROMORPHONE HCL 1 MG/ML IJ SOLN
1.0000 mg | Freq: Once | INTRAMUSCULAR | Status: AC
  Administered 2014-11-09: 1 mg via INTRAVENOUS
  Filled 2014-11-09: qty 1

## 2014-11-09 NOTE — ED Notes (Addendum)
Pt reports he is unable to void and feels like his bladder is full, has had this problem before and had to have a foley catheter placed, Dr. Mingo Amber aware, advised that in and out cath can be done,

## 2014-11-09 NOTE — ED Provider Notes (Signed)
CSN: [id number]     Arrival date & time 11/09/14  1928 History   First MD Initiated Contact with Patient 11/09/14 1928     Chief Complaint  Patient presents with  . Abdominal Pain  . Constipation     (Consider location/radiation/quality/duration/timing/severity/associated sxs/prior Treatment) Patient is a 69 y.o. male presenting with abdominal pain and constipation. The history is provided by the patient.  Abdominal Pain Pain location:  Generalized Pain quality: burning   Pain radiation: to his esophagus. Pain severity:  Moderate Onset quality:  Gradual Timing:  Intermittent Progression:  Worsening Chronicity:  New Context: not eating and not trauma   Relieved by: relieved by laxatives last night, however pain returned today. Worsened by:  Nothing tried Associated symptoms: constipation   Associated symptoms: no cough, no diarrhea, no fever, no nausea, no shortness of breath and no vomiting   Constipation Associated symptoms: abdominal pain   Associated symptoms: no diarrhea, no fever, no nausea and no vomiting     Past Medical History  Diagnosis Date  . Arthritis   . SOB (shortness of breath)   . Pulmonary asbestosis   . Hernia of unspecified site of abdominal cavity without mention of obstruction or gangrene     hiatal  . GERD (gastroesophageal reflux disease)   . Depression   . Diverticulitis     hospital 2011 [facility]  . Diverticulosis   . Reflex sympathetic dystrophy   . 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 Hypertension   . IBS (irritable bowel syndrome)   . Peripheral vascular disease     ankle brachial index of 0.79 on the right and 0.61 on the left.   . Myocardial infarction 01/2014    NSTEMI  . Hyperlipidemia LDL goal <70 04/20/2014  . AAA (abdominal aortic aneurysm), stable 04/20/2014   Past Surgical History  Procedure Laterality Date  . Nissen fundoplication    . Right elbow surgery      x 2  . Right knee arthroscopy    . Left shoulder       x 3  . Squamous cell skin  cancer      Left Hand  . Cataract extraction    . Supraventricular tachycardia ablation N/A 01/16/2014    Procedure: SUPRAVENTRICULAR TACHYCARDIA ABLATION;  Surgeon: Evans Lance, MD;  Location: [facility] CATH LAB;  Service: Cardiovascular;  Laterality: N/A;  . Left heart catheterization with coronary angiogram N/A 01/17/2014    Procedure: LEFT HEART CATHETERIZATION WITH CORONARY ANGIOGRAM;  Surgeon: Leonie Man, MD;  Location: [facility] CATH LAB;  Service: Cardiovascular;  Laterality: N/A;   Family History  Problem Relation Age of Onset  . Colon cancer Mother   . Colon cancer Maternal Aunt   . Colon cancer Maternal Uncle   . Heart attack Neg Hx   . Stroke Neg Hx    History  Substance Use Topics  . Smoking status: Former Smoker -- 2.50 packs/day for 50 years    Types: Cigarettes    Quit date: 05/05/2005  . Smokeless tobacco: Former Systems developer    Quit date: 05/03/2012  . Alcohol Use: No     Comment: last use 2013    Review of Systems  Constitutional: Negative for fever.  Respiratory: Negative for cough and shortness of breath.   Gastrointestinal: Positive for abdominal pain and constipation. Negative for nausea, vomiting and diarrhea.  All other systems reviewed and are negative.     Allergies  Codeine  Home Medications   Prior to Admission medications  Medication Sig Start Date End Date Taking? Authorizing Provider  albuterol (PROVENTIL) (2.5 MG/3ML) 0.083% nebulizer solution Take 3 mLs (2.5 mg total) by nebulization every 3 (three) hours as needed for wheezing or shortness of breath. 05/02/14   Theodis Blaze, MD  alprazolam [name]) 2 MG tablet Take 1 tablet (2 mg total) by mouth 2 (two) times daily. 05/02/14   Theodis Blaze, MD  amitriptyline (ELAVIL) 75 MG tablet Take 75 mg by mouth at bedtime.    Historical Provider, MD  amLODipine (NORVASC) 2.5 MG tablet Take 1 tablet (2.5 mg total) by mouth daily. Patient taking differently: Take 2.5 mg by mouth every evening.  02/01/14   [name], PA-C  aspirin 81 MG chewable tablet Chew 1 tablet (81 mg total) by mouth daily. 01/20/14   [name], NP  atorvastatin (LIPITOR) 40 MG tablet Take 1 tablet (40 mg total) by mouth daily. 10/18/14   Evans Lance, MD  clopidogrel (PLAVIX) 75 MG tablet Take 1 tablet (75 mg total) by mouth daily. 10/18/14   Evans Lance, MD  finasteride (PROSCAR) 5 MG tablet Take 5 mg by mouth every morning.     Historical Provider, MD  gabapentin (NEURONTIN) 300 MG capsule Take 300 mg by mouth 3 (three) times daily.     Historical Provider, MD  HYDROcodone-acetaminophen (NORCO/VICODIN) 5-325 MG per tablet Take one tablet by mouth every four hours as needed for moderate pain 05/03/14   Gildardo Cranker, DO  ipratropium-albuterol (DUONEB) 0.5-2.5 (3) MG/3ML SOLN Take 3 mLs by nebulization 3 (three) times daily. 05/02/14   Theodis Blaze, MD  lubiprostone (AMITIZA) 24 MCG capsule Take 24 mcg by mouth 2 (two) times daily with a meal.    Historical Provider, MD  metoprolol (LOPRESSOR) 50 MG tablet Take 25 mg by mouth 2 (two) times daily.    Historical Provider, MD  nitroGLYCERIN (NITROSTAT) 0.4 MG SL tablet Place 1 tablet (0.4 mg total) under the tongue every 5 (five) minutes x 3 doses as needed for chest pain. 04/20/14   [name], NP  pantoprazole (PROTONIX) 40 MG tablet TAKE 1 TABLET BY MOUTH DAILY 06/13/14   Lorretta Harp, MD  tamsulosin (FLOMAX) 0.4 MG CAPS capsule Take 1 capsule (0.4 mg total) by mouth daily. Patient taking differently: Take 0.4 mg by mouth at bedtime.  09/08/13   Ripudeep K Rai, MD   BP 148/88 mmHg  Pulse 78  Temp(Src) 97.8 F (36.6 C)  Resp 22  Ht 6' (1.829 m)  Wt 212 lb (96.163 kg)  BMI 28.75 kg/m2  SpO2 99% Physical Exam  Constitutional: He is oriented to person, place, and time. He appears well-developed and well-nourished. No distress.  HENT:  Head: Normocephalic and atraumatic.  Mouth/Throat: Oropharynx is clear and moist. No oropharyngeal exudate.  Eyes: EOM are  normal. Pupils are equal, round, and reactive to light.  Neck: Normal range of motion. Neck supple.  Cardiovascular: Normal rate and regular rhythm.  Exam reveals no friction rub.   No murmur heard. Pulmonary/Chest: Effort normal and breath sounds normal. No respiratory distress. He has no wheezes. He has no rales.  Abdominal: Soft. He exhibits no distension and no mass. There is tenderness (diffuse). There is no rebound and no guarding.  Musculoskeletal: Normal range of motion. He exhibits no edema.  Neurological: He is alert and oriented to person, place, and time.  Skin: No rash noted. He is not diaphoretic.  Nursing note and vitals reviewed.  ED Course  Procedures (including critical care time) Labs Review Labs Reviewed  CBC  COMPREHENSIVE METABOLIC PANEL  URINALYSIS, ROUTINE W REFLEX MICROSCOPIC (NOT AT [facility])  LIPASE, BLOOD  I-STAT CG4 LACTIC ACID, ED    Imaging Review Ct Abdomen Pelvis W Contrast  11/09/2014   CLINICAL DATA:  69 year old with acute onset of generalized abdominal pain earlier today. Patient also complains of constipation. Personal history of diverticulitis. Surgical history includes Nissen fundoplication.  EXAM: CT ABDOMEN AND PELVIS WITH CONTRAST  TECHNIQUE: Multidetector CT imaging of the abdomen and pelvis was performed using the standard protocol following bolus administration of intravenous contrast.  CONTRAST:  113mL OMNIPAQUE IOHEXOL 300 MG/ML IV. Oral contrast was also administered.  COMPARISON:  04/19/2014, 12/22/2009, 12/19/2009.  FINDINGS: Hepatobiliary: Mild diffuse hepatic steatosis without focal hepatic parenchymal abnormality. Normal-appearing gallbladder without calcified gallstones. No biliary ductal dilation.  Spleen:  Normal in size and appearance.  Pancreas: Atrophic with diffuse fatty infiltration. No focal pancreatic parenchymal abnormality.  Adrenal glands:  Normal in appearance.  Genitourinary: Diffuse cortical thinning, scarring and cortical  cysts involving both kidneys. No significant focal parenchymal abnormality involving either kidney. No hydronephrosis. No urinary tract calculi. Normal-appearing urinary bladder.  Prostate gland upper normal in size with mild enlargement of the median lobe. Normal-appearing seminal vesicles.  Gastrointestinal: Post surgical changes related to Nissen fundoplication without root hiatal hernia. Stomach normal in appearance for degree of distention. Normal-appearing small bowel. Lipoma involving the ileocecal valve. Entire colon relatively decompressed. Sigmoid colon tortuous and redundant. Scattered diverticula involving the descending colon with moderate sigmoid colon diverticulosis. No evidence acute diverticulitis. Cecum collapsed which I believe accounts for the apparent wall thickening, as there is no edema or inflammation in the surrounding mesenteric fat. Normal appearing appendix in the right upper pelvis extending to the midline.  Ascites:  Absent.  Vascular: Severe aorto-iliofemoral atherosclerosis with an infrarenal abdominal aortic aneurysm, maximum diameter 3.2 cm.  Lymphatic:  No pathologic lymphadenopathy in the abdomen or pelvis.  Other findings: None.  Musculoskeletal: Osseous demineralization. Degenerative disc disease and spondylosis at L3-4. Facet degenerative changes throughout the lumbar spine. Degenerative changes in the sacroiliac joints.  Visualized lower thorax: Heart mildly enlarged with circumflex and right coronary atherosclerosis. Calcified pleural plaques involving the visualized lung bases with extensive pleural thickening, not significantly changed dating back to 2011. Scarring involving the lower lobes, lingula and right middle lobe.  IMPRESSION: 1. No acute abnormalities involving the abdomen or pelvis. 2. Mild diffuse hepatic steatosis without focal hepatic parenchymal abnormality. 3. Diverticulosis involving the descending and sigmoid colon without evidence of acute diverticulitis.  4. 3.2 cm infrarenal abdominal aortic aneurysm. Recommend followup by ultrasound in 3 years. This recommendation follows ACR consensus guidelines: White Paper of the ACR Incidental Findings Committee II on Vascular Findings. J Am Coll Radiol 2013; 10:789-794. 5. 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 6. No evidence of recurrent hiatal hernia post Nissen fundoplication.   Electronically Signed   By: Evangeline Dakin M.D.   On: 11/09/2014 22:07     EKG Interpretation None      MDM   Final diagnoses:  Epigastric burning sensation    70 year old male with history of IBS, diverticulitis, AAA who presents with abdominal pain. Has diffuse burning sensation. His been gone on for several days. He had some constipation, but that was relieved with flexed his last night. It began again today with more pain. No  nausea or vomiting. No fever. Will CT to look for bowel obstruction. CT ok. Burning relieved with Gi cocktail. Given ranitidine, cannot give PPI as he's on plavix. Instructed to f/u with his GI doctor as I believe this is likely some of his IBS.   Evelina Bucy, MD 11/10/14 [phone]

## 2014-11-09 NOTE — ED Notes (Signed)
abd pain constipation, last BM was yesterday 1830, has taken laxative, taken stool softener.  138/78, 78 bpm, 98% room air, took a xanex before he got here.

## 2014-11-10 LAB — URINALYSIS, ROUTINE W REFLEX MICROSCOPIC
BILIRUBIN URINE: NEGATIVE
Glucose, UA: NEGATIVE mg/dL
Hgb urine dipstick: NEGATIVE
Ketones, ur: NEGATIVE mg/dL
Leukocytes, UA: NEGATIVE
Nitrite: NEGATIVE
Protein, ur: NEGATIVE mg/dL
SPECIFIC GRAVITY, URINE: 1.036 — AB (ref 1.005–1.030)
UROBILINOGEN UA: 0.2 mg/dL (ref 0.0–1.0)
pH: 7.5 (ref 5.0–8.0)

## 2014-11-10 MED ORDER — RANITIDINE HCL 150 MG PO TABS: 150.0000 mg | ORAL_TABLET | Freq: Two times a day (BID) | ORAL | Status: DC

## 2014-11-10 NOTE — ED Notes (Signed)
MD at bedside. 

## 2014-11-10 NOTE — Discharge Instructions (Signed)

## 2014-11-12 ENCOUNTER — Telehealth: Payer: Self-pay | Admitting: Internal Medicine

## 2014-11-12 NOTE — Telephone Encounter (Signed)
Called last evening c/o epigastric burning. Cardiologist took him off PPI "Plavix interaction". Had ER evaluation 7/716 for the same. Told to follow up with his GI. I told him to take PPI twice daily for now and call office Monday for appointment with Dr Olevia Perches or AP

## 2014-11-13 NOTE — Telephone Encounter (Signed)
Spoke with patient and scheduled on 11/15/14 at 1:45 PM with Cecille Rubin Hvozdovic, PA-C.

## 2014-11-14 ENCOUNTER — Telehealth: Payer: Self-pay | Admitting: Internal Medicine

## 2014-11-14 NOTE — Telephone Encounter (Signed)
Spoke with pt and discussed with him that there are no sooner appts available than tomorrow. Discussed with pt that he can take the zantac in addition to her protonix to see if that helps until seen tomorrow. Pt verbalized understanding.

## 2014-11-15 ENCOUNTER — Encounter: Payer: Self-pay | Admitting: Physician Assistant

## 2014-11-15 ENCOUNTER — Ambulatory Visit (INDEPENDENT_AMBULATORY_CARE_PROVIDER_SITE_OTHER): Payer: Medicare Other | Admitting: Physician Assistant

## 2014-11-15 ENCOUNTER — Other Ambulatory Visit: Payer: Medicare Other

## 2014-11-15 VITALS — BP 92/50 | HR 60 | Wt 209.0 lb

## 2014-11-15 DIAGNOSIS — Z9861 Coronary angioplasty status: Secondary | ICD-10-CM | POA: Diagnosis not present

## 2014-11-15 DIAGNOSIS — R1310 Dysphagia, unspecified: Secondary | ICD-10-CM | POA: Diagnosis not present

## 2014-11-15 DIAGNOSIS — K219 Gastro-esophageal reflux disease without esophagitis: Secondary | ICD-10-CM

## 2014-11-15 DIAGNOSIS — R1013 Epigastric pain: Secondary | ICD-10-CM | POA: Diagnosis not present

## 2014-11-15 DIAGNOSIS — R197 Diarrhea, unspecified: Secondary | ICD-10-CM

## 2014-11-15 DIAGNOSIS — I2510 Atherosclerotic heart disease of native coronary artery without angina pectoris: Secondary | ICD-10-CM | POA: Diagnosis not present

## 2014-11-15 DIAGNOSIS — Z7901 Long term (current) use of anticoagulants: Secondary | ICD-10-CM

## 2014-11-15 MED ORDER — SUCRALFATE 1 GM/10ML PO SUSP: 1.0000 g | Freq: Four times a day (QID) | ORAL | Status: DC

## 2014-11-15 MED ORDER — SUCRALFATE 1 G PO TABS: ORAL_TABLET | ORAL | Status: DC

## 2014-11-15 MED ORDER — PANTOPRAZOLE SODIUM 40 MG PO TBEC: DELAYED_RELEASE_TABLET | ORAL | Status: DC

## 2014-11-15 NOTE — Patient Instructions (Addendum)
Barium Swallow with Tablet.  Nothing to eat or drink 3 hours prior to procedure.  11/20/2014 at 1030 at [facility].  Please arrive at 10:15am.  If you need to reschedule please call [phone].  We have sent the following medications to your pharmacy for you to pick up at your convenience:  Pantoprazole 40mg , take 1 tablet by mouth twice daily 30 minutes prior to breakfast and bedtime.  Carafate take  before meals and at bedtime.  Your physician has requested that you go to the basement for the following lab work before leaving today:Stool Cdiff

## 2014-11-15 NOTE — Progress Notes (Signed)
Reviewedf, pt well known to me. I agree with Barium esophagram  first.

## 2014-11-15 NOTE — Progress Notes (Signed)
Patient ID: Curtis [name], male   DOB: March 23, 1946, 69 y.o.   MRN: [id number]     History of Present Illness: Curtis [name] is 69 year old male who is known to Dr. Maurene Capes with a history of IBS, chronic constipation, diverticulosis, diverticulitis with microperforation in August 2011, GERD, status post Nissen fundoplication in 3500. Previous GI endoscopies include 07/23/2010 colonoscopy for family history of maternal colon cancer. Previous colonoscopy 1994 normal mild sigmoid diverticulosis was noted. He had an EGD 10/03/2004 for dysphagia and reflux symptoms. Noted to have functioning Nissen fundoplication with no obvious stricture noted. Patient empirically dilated with 54 mm Maloney dilator. Retained food was noted in the stomach. He also has a history of reflux sympathetic dystrophy, COPD, and asbestosis for which she is on disability. He has an infrarenal 3.1 cm abdominal aortic aneurysm per CT scan in August 2011. He has a history of SVT requiring cardioversion and ablation of AV nodal reentry tachycardia in September 2015. On 01/17/2014 he had a cardiac cath with drug-eluting stent placement 2 tandem CFX lesions, RCA ostia was 100% occluded. He was on bright lids which was changed to Plavix in December 2015. He has been having joint pains in several weeks ago stopped his Protonix because he thought it may cause joint pain. After being off his Protonix for a week and a half he had no change in his joint pain so he says he restarted it. He has been having epigastric burning pain. He was seen in the emergency room on July 7. He had a CT of the abdomen and pelvis which showed no acute abnormalities involving the abdomen or pelvis. He was noted to have mild diffuse hepatic steatosis without focal hepatic parenchymal abnormality. Diverticulosis involving the descending and sigmoid colon without evidence of diverticulitis. 3.2 cm infrarenal abdominal aortic aneurysm. He states over the past several weeks he  has been having difficulty swallowing solids and has more recently had episodes of liquids coming back up as well. He has epigastric burning pain that is worse on an empty stomach and temporarily alleviated with ingestion of food. Over the past 3-4 months he has been getting heartburn on a daily basis. He has a history of IBS and frequently goes several days without bowel movements then take Celexa to have and has explosive stools. He states though for the past 7 days he has been having diarrhea 5-7 times per day. No bright red blood per rectum or melena.   Past Medical History  Diagnosis Date  . Arthritis   . SOB (shortness of breath)   . Pulmonary asbestosis   . Hernia of unspecified site of abdominal cavity without mention of obstruction or gangrene     hiatal  . GERD (gastroesophageal reflux disease)   . Depression   . Diverticulitis     hospital 2011 [facility]  . Diverticulosis   . Reflex sympathetic dystrophy   . 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 Hypertension   . IBS (irritable bowel syndrome)   . Peripheral vascular disease     ankle brachial index of 0.79 on the right and 0.61 on the left.   . Myocardial infarction 01/2014    NSTEMI  . Hyperlipidemia LDL goal <70 04/20/2014  . AAA (abdominal aortic aneurysm), stable 04/20/2014    Past Surgical History  Procedure Laterality Date  . Nissen fundoplication    . Right elbow surgery      x 2  . Right knee arthroscopy    . Left shoulder  x 3  . Squamous cell skin cancer      Left Hand  . Cataract extraction    . Supraventricular tachycardia ablation N/A 01/16/2014    Procedure: SUPRAVENTRICULAR TACHYCARDIA ABLATION;  Surgeon: Evans Lance, MD;  Location: [facility] CATH LAB;  Service: Cardiovascular;  Laterality: N/A;  . Left heart catheterization with coronary angiogram N/A 01/17/2014    Procedure: LEFT HEART CATHETERIZATION WITH CORONARY ANGIOGRAM;  Surgeon: Leonie Man, MD;  Location: [facility] CATH LAB;  Service: Cardiovascular;  Laterality: N/A;     Family History  Problem Relation Age of Onset  . Colon cancer Mother   . Colon cancer Maternal Aunt   . Colon cancer Maternal Uncle   . Heart attack Neg Hx   . Stroke Neg Hx    History  Substance Use Topics  . Smoking status: Former Smoker -- 2.50 packs/day for 50 years    Types: Cigarettes    Quit date: 05/05/2005  . Smokeless tobacco: Former Systems developer    Quit date: 05/03/2012  . Alcohol Use: No     Comment: last use 2013   Current Outpatient Prescriptions  Medication Sig Dispense Refill  . alprazolam (XANAX) 2 MG tablet Take 1 tablet (2 mg total) by mouth 2 (two) times daily. 30 tablet 0  . amitriptyline (ELAVIL) 75 MG tablet Take 75 mg by mouth at bedtime.    [name] amLODipine (NORVASC) 2.5 MG tablet Take 1 tablet (2.5 mg total) by mouth daily. (Patient taking differently: Take 2.5 mg by mouth every evening. ) 90 tablet 3  . atorvastatin (LIPITOR) 40 MG tablet Take 1 tablet (40 mg total) by mouth daily. 90 tablet 3  . clopidogrel (PLAVIX) 75 MG tablet Take 1 tablet (75 mg total) by mouth daily. 90 tablet 3  . finasteride (PROSCAR) 5 MG tablet Take 5 mg by mouth every morning.     . gabapentin (NEURONTIN) 300 MG capsule Take 300 mg by mouth 3 (three) times daily.     . metoprolol (LOPRESSOR) 50 MG tablet Take 25 mg by mouth 2 (two) times daily.    . nitroGLYCERIN (NITROSTAT) 0.4 MG SL tablet Place 1 tablet (0.4 mg total) under the tongue every 5 (five) minutes x 3 doses as needed for chest pain. 25 tablet 4  . pantoprazole (PROTONIX) 40 MG tablet Take 1 tablet by mouth twice daily 30 minutes prior to breakfast and bedtime. 180 tablet 3  . ranitidine (ZANTAC) 150 MG tablet Take 1 tablet (150 mg total) by mouth 2 (two) times daily. 60 tablet 0  . sucralfate (CARAFATE) 1 GM/10ML suspension Take 10 mLs (1 g total) by mouth 4 (four) times daily. 420 mL 1  . tamsulosin (FLOMAX) 0.4 MG CAPS capsule Take 1 capsule (0.4 mg total) by mouth daily. (Patient taking differently: Take 0.4 mg by mouth at  bedtime. ) 30 capsule 5   No current facility-administered medications for this visit.   Allergies  Allergen Reactions  . Codeine Itching      Review of Systems: Gen: Denies any fever, chills, sweats, anorexia, fatigue, weakness, malaise, weight loss, and sleep disorder CV: Denies chest pain, angina, palpitations, syncope, orthopnea, PND, peripheral edema, and claudication. Resp: Denies dyspnea at rest, has dyspnea with exercise, has occasional cough, sputum, wheezing, coughing up blood, and pleurisy. GI: Denies vomiting blood, jaundice, and fecal incontinence.   Has dysphagia to solids and liquids GU : Denies urinary burning, blood in urine, urinary frequency, urinary hesitancy, nocturnal urination, and urinary incontinence. MS: Denies  joint pain, limitation of movement, and swelling, stiffness, low back pain, extremity pain. Denies muscle weakness, cramps, atrophy.  Derm: Denies rash, itching, dry skin, hives, moles, warts, or unhealing ulcers.  Psych: Denies depression, anxiety, memory loss, suicidal ideation, hallucinations, paranoia, and confusion. Heme: Denies bruising, bleeding, and enlarged lymph nodes. Neuro:  Denies any headaches, dizziness, paresthesia   LAB RESULTS: CBC 11/09/2014 white count 5.5, hemoglobin 12.5, hematocrit 36.8, platelets 262,000. Comprehensive metabolic panel 54/62/7035 abdomen 3.9, AST 32, ALT 37, alkaline phosphatase 72, total bili 0.5, lipase 19.   Studies:   Ct Abdomen Pelvis W Contrast  11/09/2014   CLINICAL DATA:  69 year old with acute onset of generalized abdominal pain earlier today. Patient also complains of constipation. Personal history of diverticulitis. Surgical history includes Nissen fundoplication.  EXAM: CT ABDOMEN AND PELVIS WITH CONTRAST  TECHNIQUE: Multidetector CT imaging of the abdomen and pelvis was performed using the standard protocol following bolus administration of intravenous contrast.  CONTRAST:  151mL OMNIPAQUE IOHEXOL 300  MG/ML IV. Oral contrast was also administered.  COMPARISON:  04/19/2014, 12/22/2009, 12/19/2009.  FINDINGS: Hepatobiliary: Mild diffuse hepatic steatosis without focal hepatic parenchymal abnormality. Normal-appearing gallbladder without calcified gallstones. No biliary ductal dilation.  Spleen:  Normal in size and appearance.  Pancreas: Atrophic with diffuse fatty infiltration. No focal pancreatic parenchymal abnormality.  Adrenal glands:  Normal in appearance.  Genitourinary: Diffuse cortical thinning, scarring and cortical cysts involving both kidneys. No significant focal parenchymal abnormality involving either kidney. No hydronephrosis. No urinary tract calculi. Normal-appearing urinary bladder.  Prostate gland upper normal in size with mild enlargement of the median lobe. Normal-appearing seminal vesicles.  Gastrointestinal: Post surgical changes related to Nissen fundoplication without root hiatal hernia. Stomach normal in appearance for degree of distention. Normal-appearing small bowel. Lipoma involving the ileocecal valve. Entire colon relatively decompressed. Sigmoid colon tortuous and redundant. Scattered diverticula involving the descending colon with moderate sigmoid colon diverticulosis. No evidence acute diverticulitis. Cecum collapsed which I believe accounts for the apparent wall thickening, as there is no edema or inflammation in the surrounding mesenteric fat. Normal appearing appendix in the right upper pelvis extending to the midline.  Ascites:  Absent.  Vascular: Severe aorto-iliofemoral atherosclerosis with an infrarenal abdominal aortic aneurysm, maximum diameter 3.2 cm.  Lymphatic:  No pathologic lymphadenopathy in the abdomen or pelvis.  Other findings: None.  Musculoskeletal: Osseous demineralization. Degenerative disc disease and spondylosis at L3-4. Facet degenerative changes throughout the lumbar spine. Degenerative changes in the sacroiliac joints.  Visualized lower thorax: Heart  mildly enlarged with circumflex and right coronary atherosclerosis. Calcified pleural plaques involving the visualized lung bases with extensive pleural thickening, not significantly changed dating back to 2011. Scarring involving the lower lobes, lingula and right middle lobe.  IMPRESSION: 1. No acute abnormalities involving the abdomen or pelvis. 2. Mild diffuse hepatic steatosis without focal hepatic parenchymal abnormality. 3. Diverticulosis involving the descending and sigmoid colon without evidence of acute diverticulitis. 4. 3.2 cm infrarenal abdominal aortic aneurysm. Recommend followup by ultrasound in 3 years. This recommendation follows ACR consensus guidelines: White Paper of the ACR Incidental Findings Committee II on Vascular Findings. J Am Coll Radiol 2013; 10:789-794. 5. 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 6. No evidence of recurrent hiatal hernia post Nissen fundoplication.   Electronically Signed   By: Evangeline Dakin M.D.   On: 11/09/2014 22:07     Physical Exam: General: Pleasant, well developed, elderly white male  in no acute distress, ambulates with a cane Head: Normocephalic and atraumatic Eyes:  sclerae anicteric, conjunctiva pink  Ears: Normal auditory acuity Lungs: Clear throughout to auscultation Heart: Regular rate and rhythm Abdomen: Soft, non distended, non-tender. No masses, no hepatomegaly. Normal bowel sounds Musculoskeletal: Symmetrical with no gross deformities  Extremities: No edema  Neurological: Alert oriented x 4, grossly nonfocal Psychological:  Alert and cooperative. Normal mood and affect  Assessment and Recommendations: 69 year old male with a long-standing history of GERD, status post Nissen fundoplication long ago, now with recurrent heartburn and dysphagia. He's been instructed to use his pantoprazole 40 mg 1 by mouth twice a  day 30 minutes prior to breakfast and bedtime. He will be given a trial of sucralfate 1 tablet before meals and at bedtime. Due to his complaints of dysphagia, he will be scheduled for a barium swallow with tablet. If he is noted to have a stricture he will be considered for EGD, however he will need to be off his Plavix for several days prior to the procedure and we will need to check with cardiology as patient had his stents placed in September 2015. In the meantime due to his diarrhea he's been instructed to adhere to a high-fiber low-fat diet. A stool for C. difficile will be obtained. Further recommendations will be made pending the findings of the above.        Raliegh Scobie, Deloris Ping 11/15/2014,

## 2014-11-16 ENCOUNTER — Telehealth: Payer: Self-pay | Admitting: Physician Assistant

## 2014-11-16 NOTE — Telephone Encounter (Signed)
Second call today from patient. He called earlier to report he still had burning in throat to rectum after 2 doses of Carafate. Discussed with patient that it may take more than 2 doses for it to get better. He is calling again to report he took another dose of Carafate and still has burning. States he has so many things wrong with him that he gets nervous. He states he got SOB and thinks it is a panic attack. He took his anxiety pill. Patient instructed to go to ED if he feels he needs to due to SOB. He states he is feeling some better now.

## 2014-11-16 NOTE — Telephone Encounter (Signed)
Spoke with patient and he states he has taken the Carafate 3 times since OV yesterday. He states his throat to rectum still burns like it has been. Explained to patient that it may take several days before it starts to feel better. He understands to take Carafate QID and Protonix BID.

## 2014-11-20 ENCOUNTER — Other Ambulatory Visit: Payer: Self-pay

## 2014-11-20 ENCOUNTER — Ambulatory Visit (HOSPITAL_COMMUNITY)
Admission: RE | Admit: 2014-11-20 | Discharge: 2014-11-20 | Disposition: A | Discharge status: Home / Self Care | Payer: Medicare Other | Source: Ambulatory Visit | Attending: Physician Assistant | Admitting: Physician Assistant

## 2014-11-20 ENCOUNTER — Other Ambulatory Visit: Payer: Medicare Other

## 2014-11-20 DIAGNOSIS — R197 Diarrhea, unspecified: Secondary | ICD-10-CM

## 2014-11-20 DIAGNOSIS — K224 Dyskinesia of esophagus: Secondary | ICD-10-CM | POA: Diagnosis not present

## 2014-11-20 DIAGNOSIS — R1310 Dysphagia, unspecified: Secondary | ICD-10-CM

## 2014-11-20 DIAGNOSIS — R4702 Dysphasia: Secondary | ICD-10-CM | POA: Diagnosis present

## 2014-11-21 ENCOUNTER — Telehealth: Payer: Self-pay

## 2014-11-21 LAB — CLOSTRIDIUM DIFFICILE BY PCR: CDIFFPCR: NOT DETECTED

## 2014-11-21 NOTE — Telephone Encounter (Signed)
Called the patient to give him his results. He proceeds to relate events from the beginning of the month about his symptoms. Unclear what his questions actually are. Patient on the phone for 25 minutes. Able to establish that he will continue his medications and that he will be contacted with the plan of care as test results come in.

## 2014-11-21 NOTE — Telephone Encounter (Signed)
-----   Message from Vita Barley Hvozdovic, PA-C sent at 11/21/2014  3:34 PM EDT ----- Let pt know c diff negative.

## 2014-11-22 NOTE — Telephone Encounter (Signed)
See labs 

## 2014-11-23 ENCOUNTER — Ambulatory Visit (INDEPENDENT_AMBULATORY_CARE_PROVIDER_SITE_OTHER): Payer: Medicare Other | Admitting: Physician Assistant

## 2014-11-23 ENCOUNTER — Encounter: Payer: Self-pay | Admitting: Physician Assistant

## 2014-11-23 ENCOUNTER — Telehealth: Payer: Self-pay | Admitting: Internal Medicine

## 2014-11-23 VITALS — BP 130/76 | HR 73 | Ht 72.0 in | Wt 208.2 lb

## 2014-11-23 DIAGNOSIS — R072 Precordial pain: Secondary | ICD-10-CM

## 2014-11-23 DIAGNOSIS — I2510 Atherosclerotic heart disease of native coronary artery without angina pectoris: Secondary | ICD-10-CM

## 2014-11-23 DIAGNOSIS — Z9861 Coronary angioplasty status: Secondary | ICD-10-CM

## 2014-11-23 MED ORDER — ASPIRIN EC 81 MG PO TBEC: 81.0000 mg | DELAYED_RELEASE_TABLET | Freq: Every day | ORAL | Status: DC

## 2014-11-23 NOTE — Progress Notes (Signed)
Cardiology Office Note   Date:  11/23/2014   ID:  Curtis [name], DOB 08-Jan-1946, MRN [id number]  PCP:  Curtis Seller, MD  Cardiologist:  Dr. Winona Legato, Suanne Marker, PA-C   Burning pain on Plavix  History of Present Illness: Curtis [name] is a 69 y.o. male with a history of SVT/NSTEMI 01/16/2014 with subsequent ablation of the SVT, and Promus Premier 3.5 mm x 38 mm DES to the CFX  Curtis [name] presents for evaluation of his pain and determination if he should remain on the Plavix.  About 3 weeks ago, Curtis [name] developed a burning pain a from his throat to his rectum. The pain will start in the morning, and last all day long. He is miserable with this. He ended up in the emergency room, a CT scan did not show any acute abnormality, and was referred to GI.  As part of his GI evaluation, he had a barium swallow. He had a diffuse motility disorder and the 13 mm barium tablet became lodged behind the smooth gradual narrowing of the distal esophagus. He has a history of numerous GI procedures in the past. He was taken off his Plavix and put on Carafate.  48 hours after he went off the Plavix and on the Carafate, his symptoms resolved. He stopped the Carafate and the symptoms returned today. He is here today to figure out what to do about his medications and to figure out if he can have the endoscopy and how soon.  Past Medical History  Diagnosis Date  . Arthritis   . SOB (shortness of breath)   . Pulmonary asbestosis   . Hernia of unspecified site of abdominal cavity without mention of obstruction or gangrene     hiatal  . GERD (gastroesophageal reflux disease)   . Depression   . Diverticulitis     hospital 2011 [facility]  . Diverticulosis   . Reflex sympathetic dystrophy   . 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 Hypertension   . IBS (irritable bowel syndrome)   . Peripheral vascular disease     ankle brachial index of 0.79 on the right and 0.61 on the left.   . Myocardial infarction  01/2014    NSTEMI  . Hyperlipidemia LDL goal <70 04/20/2014  . AAA (abdominal aortic aneurysm), stable 04/20/2014    Past Surgical History  Procedure Laterality Date  . Nissen fundoplication    . Right elbow surgery      x 2  . Right knee arthroscopy    . Left shoulder       x 3  . Squamous cell skin cancer      Left Hand  . Cataract extraction    . Supraventricular tachycardia ablation N/A 01/16/2014    Procedure: SUPRAVENTRICULAR TACHYCARDIA ABLATION;  Surgeon: Evans Lance, MD;  Location: [facility] CATH LAB;  Service: Cardiovascular;  Laterality: N/A;  . Left heart catheterization with coronary angiogram N/A 01/17/2014    Procedure: LEFT HEART CATHETERIZATION WITH CORONARY ANGIOGRAM;  Surgeon: Leonie Man, MD;  Location: [facility] CATH LAB;  Service: Cardiovascular;  Laterality: N/A;    Current Outpatient Prescriptions  Medication Sig Dispense Refill  . alprazolam (XANAX) 2 MG tablet Take 1 tablet (2 mg total) by mouth 2 (two) times daily. 30 tablet 0  . amitriptyline (ELAVIL) 75 MG tablet Take 75 mg by mouth at bedtime.    [name] amLODipine (NORVASC) 2.5 MG tablet Take 2.5 mg by mouth every evening.    [name] atorvastatin (  LIPITOR) 40 MG tablet Take 1 tablet (40 mg total) by mouth daily. 90 tablet 3  . finasteride (PROSCAR) 5 MG tablet Take 5 mg by mouth every morning.     . gabapentin (NEURONTIN) 300 MG capsule Take 300 mg by mouth 3 (three) times daily.     [name] latanoprost (XALATAN) 0.005 % ophthalmic solution Place 1 drop into both eyes at bedtime.    . metoprolol (LOPRESSOR) 50 MG tablet Take 25 mg by mouth 2 (two) times daily.    . nitroGLYCERIN (NITROSTAT) 0.4 MG SL tablet Place 1 tablet (0.4 mg total) under the tongue every 5 (five) minutes x 3 doses as needed for chest pain. 25 tablet 4  . pantoprazole (PROTONIX) 40 MG tablet Take 1 tablet by mouth twice daily 30 minutes prior to breakfast and bedtime. 180 tablet 3  . ranitidine (ZANTAC) 150 MG tablet Take 1 tablet (150 mg total) by mouth 2  (two) times daily. 60 tablet 0  . sucralfate (CARAFATE) 1 GM/10ML suspension Take 10 mLs (1 g total) by mouth 4 (four) times daily. 420 mL 1  . tamsulosin (FLOMAX) 0.4 MG CAPS capsule Take 0.4 mg by mouth at bedtime.    [name] aspirin EC 81 MG tablet Take 1 tablet (81 mg total) by mouth daily. Or Enteric Coated (EC) 81 mg     No current facility-administered medications for this visit.    Allergies:   Codeine    Social History:  The patient  reports that he quit smoking about 9 years ago. His smoking use included Cigarettes. He has a 125 pack-year smoking history. He quit smokeless tobacco use about 2 years ago. He reports that he does not drink alcohol or use illicit drugs.   Family History:  The patient's family history includes Colon cancer in his maternal aunt, maternal uncle, and mother. There is no history of Heart attack or Stroke.    ROS:  Please see the history of present illness. All other systems are reviewed and negative.    PHYSICAL EXAM: VS:  BP 130/76 mmHg  Pulse 73  Ht 6' (1.829 m)  Wt 208 lb 3.2 oz (94.439 kg)  BMI 28.23 kg/m2  SpO2 87% , BMI Body mass index is 28.23 kg/(m^2). GEN: Well nourished, well developed, elderly male, in no acute distress HEENT: normal Neck: no JVD, carotid bruits, or masses Cardiac: RRR; 2/6 murmur, no rubs, or gallops,no edema  Respiratory:  clear to auscultation bilaterally, normal work of breathing; initially a few basilar rales noted, cleared with deep inspiration. GI: soft, nontender, nondistended, + BS MS: no deformity or atrophy Skin: warm and dry, no rash Neuro:  Strength and sensation are intact Psych: euthymic mood, full affect   EKG:  EKG is not ordered today.  Echo: 01/16/2014 Study Conclusions - Left ventricle: The cavity size was normal. Wall thickness wasincreased in a pattern of mild LVH. Systolic function was normal.The estimated ejection fraction was in the range of 55% to 60%.Wall motion was normal; there were no  regional wall motionabnormalities. Doppler parameters are consistent with abnormalleft ventricular relaxation (grade 1 diastolic dysfunction). TheE/e&' ratio is between 8-15, suggesting indeterminate LV fillingpressure. - Aortic valve: Structurally normal valve. Trileaflet. There was mild regurgitation. - Mitral valve: Mildly thickened leaflets . There was moderate regurgitation. - Left atrium: Mildly dilated at 35 ml/m2. Impressions: - Compared to the echo in 09/2013, there are few changes - there isnow moderete MR.  Recent Labs: 01/16/2014: Magnesium 1.9; TSH 2.380 04/24/2014: Pro B  Natriuretic peptide (BNP) 330.3* 04/27/2014: B Natriuretic Peptide 73.5 11/09/2014: ALT 37; BUN 14; Creatinine, Ser 1.25*; Hemoglobin 12.5*; Platelets 262; Potassium 4.0; Sodium 133*    Lipid Panel    Component Value Date/Time   CHOL 102 04/18/2014 0306   TRIG 222* 04/18/2014 0306   HDL 24* 04/18/2014 0306   CHOLHDL 4.3 04/18/2014 0306   VLDL 44* 04/18/2014 0306   LDLCALC 34 04/18/2014 0306   LDLDIRECT 43.7 04/03/2014 1020     Wt Readings from Last 3 Encounters:  11/23/14 208 lb 3.2 oz (94.439 kg)  11/15/14 209 lb (94.802 kg)  11/09/14 212 lb (96.163 kg)     Other studies Reviewed: Additional studies/ records that were reviewed today include: Previous office notes, echocardiogram and cath report.  ASSESSMENT AND PLAN:  1.  Non-STEMI 01/2014 with DES to the CFX: Patient was compliant with the aspirin and Plavix until he developed the GI issues. He has been off the Plavix for several days, and is not having any ischemic symptoms.  Discussed the situation with Dr. Radford Pax, and then with the patient.  There is some risk associated with stopping the Plavix prior to a year since he had a DES. The risks were discussed with the patient and his son in the room. The options are to continue the Plavix with Carafate, stop the Plavix and use aspirin with Carafate, or stop both of them.   After  reviewing the risks and benefits of all of these options, the patient and his son feel the best option is to stop the Plavix, continue aspirin, and continue the Carafate. If he needs to come off the aspirin for the endoscopy, that is okay. He is currently not having any ischemic symptoms. He is having problems swallowing pills. He was advised that he could take a chewable aspirin, chew it up and swallow with some applesauce and large amount of water, or if he can't swallow the pill and get it all the way down, use an enteric coated aspirin.   Current medicines are reviewed at length with the patient today.  The patient does not have any additional concerns regarding medicines.  The following changes have been made:  Stop Plavix  Labs/ tests ordered today include:  No orders of the defined types were placed in this encounter.     Disposition:   FU with Dr. Burt Knack in 3 months  Signed, Lenoard Aden  11/23/2014 2:53 PM    [address], [place], [place]  [address] Phone: [phone]; Fax: [phone]

## 2014-11-23 NOTE — Telephone Encounter (Signed)
Pt walked in with his son Elta Guadeloupe. They say they went up to see the cardiologist this morning and got clearance. He stated he would like [name] to call him back directly. Per Lori's note pt needs a "a barium swallow with tablet. If he is noted to have a stricture he will be considered for EGD, however he will need to be off his Plavix for several days prior to the procedure." I explained I am unable to schedule that test, and that Dr. Nichola Sizer nurse would call them back.

## 2014-11-23 NOTE — Telephone Encounter (Signed)
Spoke with patient's son. Patient was seen by cardiology and has been taken off Plavix. (cardiology note not completed yet). He is asking to schedule procedure as soon as possible because patient is having difficulty swallowing.

## 2014-11-23 NOTE — Telephone Encounter (Signed)
Yes, we can schedule EGD tentatively, pending results of Barium swallow,LEC is OK.But he needs the Barium first

## 2014-11-23 NOTE — Patient Instructions (Addendum)
Medication Instructions:  Your physician has recommended you make the following change in your medication:  1- STOP Plavix  2- START Aspirin 81 mg by mouth either chewable with food or enteric coated daily   Labwork: NONE  Testing/Procedures: NONE  Follow-Up: Your physician wants you to follow-up in: 3 months with Dr. Burt Knack. You will receive a reminder letter in the mail two months in advance. If you don't receive a letter, please call our office to schedule the follow-up appointment.    Any Other Special Instructions Will Be Listed Below (If Applicable).  Okay to have EGD done.

## 2014-11-24 NOTE — Telephone Encounter (Signed)
Patient had barium swallow on 11/20/14. Are you ok with going ahead with EGD?

## 2014-11-24 NOTE — Telephone Encounter (Signed)
Yes, I have reviewed his Barium study. It is OK to schedule him for [place] EGD/dil. He if OFF antiocoag.

## 2014-11-24 NOTE — Telephone Encounter (Signed)
Left a message for patient's son to call to schedule procedure.

## 2014-11-24 NOTE — Telephone Encounter (Signed)
Spoke with patient's son and scheduled EGD on 12/08/14 at 7:30 AM and pre visit on 11/28/14 at 4:30 PM.

## 2014-11-28 ENCOUNTER — Encounter: Payer: Medicare Other | Admitting: *Deleted

## 2014-11-28 ENCOUNTER — Telehealth: Payer: Self-pay | Admitting: Physician Assistant

## 2014-11-29 ENCOUNTER — Ambulatory Visit (AMBULATORY_SURGERY_CENTER): Payer: Self-pay | Admitting: *Deleted

## 2014-11-29 VITALS — Ht 72.0 in | Wt 208.0 lb

## 2014-11-29 DIAGNOSIS — R1310 Dysphagia, unspecified: Secondary | ICD-10-CM

## 2014-11-29 NOTE — Telephone Encounter (Signed)
Patient reports that he is having some diarrhea yesterday.  He has not any additional stools today.  He is asked to keep his appt today for pre-visit

## 2014-11-29 NOTE — Progress Notes (Signed)
Patient denies any allergies to eggs or soy. Patient denies any problems with anesthesia/sedation. Patient denies any oxygen use at home and does not take any diet/weight loss medications. EMMI education assisgned to patient on EGD, this was explained and instructions given to patient. Patient's son at pt's side during pre-visit.

## 2014-12-07 ENCOUNTER — Telehealth: Payer: Self-pay | Admitting: Gastroenterology

## 2014-12-07 NOTE — Telephone Encounter (Signed)
Returned patients call.  No answer at # given.  Left message for patient to return our call.

## 2014-12-07 NOTE — Telephone Encounter (Signed)
Patient called and states that he took stool softeners and took a laxative.  He started having bowel movements finally and he is concerned about having diarrhea tomorrow on his way to his Endoscopy.  He states that he will wear a depend if necessary to get him here.  I advised that he should do that and he can address this problem with Dr. Deatra Ina tomorrow morning prior to his sedation.  I advised him to drink plenty of fluids/water today that this would help keep his bowels moving.   Patient agreed and all questions were answered.

## 2014-12-08 ENCOUNTER — Encounter: Payer: Self-pay | Admitting: Gastroenterology

## 2014-12-08 ENCOUNTER — Ambulatory Visit (AMBULATORY_SURGERY_CENTER): Payer: Medicare Other | Admitting: Gastroenterology

## 2014-12-08 VITALS — BP 153/91 | HR 79 | Resp 18 | Ht 72.0 in | Wt 208.0 lb

## 2014-12-08 DIAGNOSIS — R1310 Dysphagia, unspecified: Secondary | ICD-10-CM | POA: Diagnosis present

## 2014-12-08 DIAGNOSIS — K222 Esophageal obstruction: Secondary | ICD-10-CM | POA: Diagnosis not present

## 2014-12-08 MED ORDER — SODIUM CHLORIDE 0.9 % IV SOLN: 500.0000 mL | INTRAVENOUS | Status: DC

## 2014-12-08 NOTE — Progress Notes (Signed)
To recovery, report to Scott, RN, VSS 

## 2014-12-08 NOTE — Op Note (Signed)
[facility]  [address] [place], [address]   ENDOSCOPY PROCEDURE REPORT  PATIENT: Curtis [name], Curtis [name]  MR#: [id number] BIRTHDATE: 06-22-1945 , 45  yrs. old GENDER: male ENDOSCOPIST: Inda Castle, MD REFERRED BY:  Kathryne Eriksson, MD PROCEDURE DATE:  12/08/2014 PROCEDURE:  EGD, diagnostic and Maloney dilation of esophagus ASA CLASS:     Class III INDICATIONS:  dysphagia and barium swallow demonstrated a distal esophageal stricture. MEDICATIONS: Monitored anesthesia care and Propofol 120 mg IV TOPICAL ANESTHETIC:  DESCRIPTION OF PROCEDURE: After the risks benefits and alternatives of the procedure were thoroughly explained, informed consent was obtained.  The LB [id number] endoscope was introduced through the mouth and advanced to the second portion of the duodenum , Without limitations.  The instrument was slowly withdrawn as the mucosa was fully examined.    ESOPHAGUS: There was a benign appearing stricture in the distal esophagus.  The stricture was traversable.  The stricture was dilated using a 61mm (52Fr) Maloney dilator.   Except for the findings listed, the EGD was otherwise normal.  Retroflexed views revealed no abnormalities.     The scope was then withdrawn from the patient and the procedure completed.  COMPLICATIONS: There were no immediate complications.  ENDOSCOPIC IMPRESSION: 1.   There was a stricture in the distal esophagus; The stricture was dilated using a 28mm (52Fr) Maloney dilator 2.   EGD was otherwise normal  RECOMMENDATIONS: resume Plavix Repeat dilation as necessary  REPEAT EXAM:  eSigned:  Inda Castle, MD 12/08/2014 7:57 AM    CC:

## 2014-12-08 NOTE — Patient Instructions (Signed)
YOU HAD AN ENDOSCOPIC PROCEDURE TODAY AT [facility]:   Refer to the procedure report that was given to you for any specific questions about what was found during the examination.  If the procedure report does not answer your questions, please call your gastroenterologist to clarify.  If you requested that your care partner not be given the details of your procedure findings, then the procedure report has been included in a sealed envelope for you to review at your convenience later.  YOU SHOULD EXPECT: Some feelings of bloating in the abdomen. Passage of more gas than usual.  Walking can help get rid of the air that was put into your GI tract during the procedure and reduce the bloating. If you had a lower endoscopy (such as a colonoscopy or flexible sigmoidoscopy) you may notice spotting of blood in your stool or on the toilet paper. If you underwent a bowel prep for your procedure, you may not have a normal bowel movement for a few days.  Please Note:  You might notice some irritation and congestion in your nose or some drainage.  This is from the oxygen used during your procedure.  There is no need for concern and it should clear up in a day or so.  SYMPTOMS TO REPORT IMMEDIATELY:   Following upper endoscopy (EGD)  Vomiting of blood or coffee ground material  New chest pain or pain under the shoulder blades  Painful or persistently difficult swallowing  New shortness of breath  Fever of 100F or higher  Black, tarry-looking stools  For urgent or emergent issues, a gastroenterologist can be reached at any hour by calling [phone].   DIET: Your first meal following the procedure should be a small meal and then it is ok to progress to your normal diet. Heavy or fried foods are harder to digest and may make you feel nauseous or bloated.  Likewise, meals heavy in dairy and vegetables can increase bloating.  Drink plenty of fluids but you should avoid alcoholic beverages for  24 hours.  ACTIVITY:  You should plan to take it easy for the rest of today and you should NOT DRIVE or use heavy machinery until tomorrow (because of the sedation medicines used during the test).    FOLLOW UP: Our staff will call the number listed on your records the next business day following your procedure to check on you and address any questions or concerns that you may have regarding the information given to you following your procedure. If we do not reach you, we will leave a message.  However, if you are feeling well and you are not experiencing any problems, there is no need to return our call.  We will assume that you have returned to your regular daily activities without incident.  If any biopsies were taken you will be contacted by phone or by letter within the next 1-3 weeks.  Please call us at [phone] if you have not heard about the biopsies in 3 weeks.    SIGNATURES/CONFIDENTIALITY: You and/or your care partner have signed paperwork which will be entered into your electronic medical record.  These signatures attest to the fact that that the information above on your After Visit Summary has been reviewed and is understood.  Full responsibility of the confidentiality of this discharge information lies with you and/or your care-partner.  Stricture and post dilation diet given with instructions.

## 2014-12-08 NOTE — Progress Notes (Signed)
Called to room to assist during endoscopic procedure.  Patient ID and intended procedure confirmed with present staff. Received instructions for my participation in the procedure from the performing physician.  

## 2014-12-11 ENCOUNTER — Telehealth: Payer: Self-pay | Admitting: *Deleted

## 2014-12-11 NOTE — Telephone Encounter (Signed)
  Follow up Call-  Call back number 12/08/2014  Post procedure Call Back phone  # [phone]  Permission to leave phone message Yes     Patient questions:  Do you have a fever, pain , or abdominal swelling? No. Pain Score  0 *  Have you tolerated food without any problems? Yes.    Have you been able to return to your normal activities? Yes.    Do you have any questions about your discharge instructions: Diet   No. Medications  No. Follow up visit  No.  Do you have questions or concerns about your Care? No.  Actions: * If pain score is 4 or above: No action needed, pain <4.

## 2014-12-15 ENCOUNTER — Telehealth: Payer: Self-pay | Admitting: Gastroenterology

## 2014-12-18 NOTE — Telephone Encounter (Signed)
No answer. Left message.

## 2014-12-19 NOTE — Telephone Encounter (Signed)
Confirmed with the patient he is to continue the Pantoprazole.

## 2014-12-20 ENCOUNTER — Telehealth: Payer: Self-pay | Admitting: Internal Medicine

## 2014-12-20 NOTE — Telephone Encounter (Signed)
New message       Pt had a procedure (endoscopy) on 12-08-14.  Pt states his hands and feet are "on fire".  He has also been taken off of his plavix.

## 2014-12-20 NOTE — Telephone Encounter (Signed)
Dr Lovena Le does not follow general cardiology.  His note says follow up with Dr Wilhemina Cash and that is the MD that did cath, however Dr Aundra Dubin is the MD that first saw patient and did H&P.  I have spoken with patient and let him know to call Dr Redmond Pulling.  Says his nerves are "uptight" and says on 11/09/14 started burning.  He called his son and his son called the paramedics.  He went to the hospital and the ER MD sent him for a CT.  The Plavix was stopped prior to the endoscopy and he never started it back.  He says the discharge papers instructed him to restart but he didn't know if he should.  I have advised him to follow the discharge instructions and to follow up with his PCP in regards to his body burning

## 2014-12-25 ENCOUNTER — Telehealth: Payer: Self-pay | Admitting: Gastroenterology

## 2014-12-26 ENCOUNTER — Other Ambulatory Visit: Payer: Self-pay

## 2014-12-26 DIAGNOSIS — R1314 Dysphagia, pharyngoesophageal phase: Secondary | ICD-10-CM

## 2014-12-26 NOTE — Telephone Encounter (Signed)
After a lot of redirecting and explaining, the patient agrees to discuss repeating the modified barium swallow with his son. He also agrees to use the applesauce or yogurt to take his medication since that worked better than just the water.

## 2014-12-26 NOTE — Telephone Encounter (Signed)
Curtis [name] calls me again today. He says has regurgitated again. He had consumed water, nothing else and it came back up. He states it hurts in the center of his chest when he swallows. The EGD with dilation was 12/08/14

## 2014-12-26 NOTE — Telephone Encounter (Signed)
ok 

## 2014-12-26 NOTE — Telephone Encounter (Signed)
Spoke with the patient. He has not had any problems swallowing his pills today.

## 2014-12-26 NOTE — Telephone Encounter (Signed)
Spoke with the patient at 5 pm yesterday. He states he is constipated and he has choked on his pills one time. He has taken Ducolax for his constipation, but it hasn't been 24 hours yet and he states it takes at least a day to work. He is passing stool, just felt it should be more. He has not been having problems with swallowing his pills since his dilation. This is the first time. He states he takes them one at a time with only a small sip of water. Discussed the need to drink sufficient fluids to assure the pill has gone down. He may also take a bite of applesauce, pudding or yogurt after the water to help. Patient expresses understanding but he is quick to get off subject. Redirected the conversation as needed.

## 2014-12-27 ENCOUNTER — Telehealth: Payer: Self-pay

## 2014-12-27 NOTE — Telephone Encounter (Signed)
Spoke with the son of the patient. He is at his father's house right now. He was there when his father "gurgled" the water back up this morning. This apparently happens only with water. I spoke with Mr Bartoli. He has been able to get his medication down with yogurt or applesauce and no problems. Modified barium swallow scheduled for Wednesday 01/03/15 at 1:00 pm arriving at 12:45 to the [facility] radiology Dept. No prep is needed.  Patient's laxative has worked for him today. He is certain the Carafate has caused the constipation. He would like to stop the Carafate for a few days to see if there is any change of his symptoms.

## 2014-12-29 ENCOUNTER — Telehealth: Payer: Self-pay | Admitting: Gastroenterology

## 2015-01-01 ENCOUNTER — Telehealth: Payer: Self-pay | Admitting: Gastroenterology

## 2015-01-02 ENCOUNTER — Other Ambulatory Visit (HOSPITAL_COMMUNITY): Payer: Self-pay | Admitting: Gastroenterology

## 2015-01-02 DIAGNOSIS — R1314 Dysphagia, pharyngoesophageal phase: Secondary | ICD-10-CM

## 2015-01-02 NOTE — Telephone Encounter (Signed)
Patient reports no further problems with swallowing or choking. He would like to put the modified barium swallow off for 2 weeks to see how he does. Moved to 01/16/15 at 1PM.

## 2015-01-03 ENCOUNTER — Ambulatory Visit (HOSPITAL_COMMUNITY): Admission: RE | Admit: 2015-01-03 | Payer: Medicare Other | Source: Ambulatory Visit

## 2015-01-04 ENCOUNTER — Telehealth: Payer: Self-pay | Admitting: Physician Assistant

## 2015-01-04 NOTE — Telephone Encounter (Signed)
Called patient, LMTCB.

## 2015-01-04 NOTE — Telephone Encounter (Signed)
Patient called back.  He saw Rosaria Ferries on 11-23-14.  At that time his Plavix was d/c'd and he was started om 81mg  aspirin.  He had an EGD on 12-08-14 by Dr Deatra Ina.  On d/c he was told to resume plavix.   He called wanting to know if we wanted his to resume Plavix.  I told him that his Plavix was d/c'd at his last visit with [place] and he was started on 81mg  aspirin.  As far as we were concerned he should be taking the aspirin and not the Plavix.  Suggested that he may want to contact Dr Deatra Ina and see why he told him to restart Plavix.  He said he was going to keep taking the 81mg  aspirin and not take the Plavix.

## 2015-01-04 NOTE — Telephone Encounter (Signed)
New message    Pt has procedure on August 5th and was taken off plavix; pt states he hasn't started back on plavix Pt wanting to know when or if he should start taking medication again

## 2015-01-15 ENCOUNTER — Telehealth: Payer: Self-pay | Admitting: Physician Assistant

## 2015-01-15 NOTE — Telephone Encounter (Signed)
Patient just wants to double check that Rosaria Ferries, PA, continues to want him to take ASA 81 mg daily and not Plavix. Informed him that the last note indicated that he was no longer on Plavix, however I would be happy to reach back out to [employer], PA, and double check. Routed to [employer], PA.

## 2015-01-15 NOTE — Telephone Encounter (Signed)
New message      Patient saw Suanne Marker on 11-23-14 and she said stop plavix and start 81mg  of aspirin because he was having his esophagus stretched.  Dr Deatra Ina did procedure on 12-08-14.  His discharge instructions said resume plavix.  Is he to resume plavix or stay off it and continue taking 81mg  aspirin?

## 2015-01-16 ENCOUNTER — Ambulatory Visit (HOSPITAL_COMMUNITY)
Admission: RE | Admit: 2015-01-16 | Discharge: 2015-01-16 | Disposition: A | Discharge status: Home / Self Care | Payer: Medicare Other | Source: Ambulatory Visit | Attending: Gastroenterology | Admitting: Gastroenterology

## 2015-01-16 ENCOUNTER — Other Ambulatory Visit (HOSPITAL_COMMUNITY): Payer: Medicare Other

## 2015-01-26 NOTE — Telephone Encounter (Signed)
Continue Aspirin and Plavix if stomach will tolerate both. That is safest.

## 2015-01-30 ENCOUNTER — Encounter: Payer: Self-pay | Admitting: Neurology

## 2015-01-30 ENCOUNTER — Ambulatory Visit (INDEPENDENT_AMBULATORY_CARE_PROVIDER_SITE_OTHER): Payer: Medicare Other | Admitting: Neurology

## 2015-01-30 VITALS — BP 132/81 | HR 70 | Ht 72.0 in | Wt 206.5 lb

## 2015-01-30 DIAGNOSIS — I2510 Atherosclerotic heart disease of native coronary artery without angina pectoris: Secondary | ICD-10-CM

## 2015-01-30 DIAGNOSIS — R269 Unspecified abnormalities of gait and mobility: Secondary | ICD-10-CM | POA: Diagnosis not present

## 2015-01-30 DIAGNOSIS — G609 Hereditary and idiopathic neuropathy, unspecified: Secondary | ICD-10-CM

## 2015-01-30 DIAGNOSIS — Z9861 Coronary angioplasty status: Secondary | ICD-10-CM

## 2015-01-30 MED ORDER — GABAPENTIN 300 MG PO CAPS: ORAL_CAPSULE | ORAL | Status: DC

## 2015-01-30 NOTE — Telephone Encounter (Signed)
Patient states he is confused on what to do. He has been told to take the Plavix and to not take the Plavix although he is unsure who told him what.  He has remained on ASA. He prefers to hear from Dr. Lovena Le regarding advisement. Will forward message to Dr. Lovena Le.

## 2015-01-30 NOTE — Progress Notes (Signed)
Reason for visit: Peripheral neuropathy  Referring physician: Dr. Kathryne Eriksson  Curtis [name] is a 69 y.o. male  History of present illness:  Curtis [name] is a 69 year old right-handed white male with a long-standing history of difficulty with sensory alteration in the feet and legs. He reports he began having problems 15 or 16 years ago, the issue has been gradually progressive over time. 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 Currently, he has symptoms on both legs, with dysesthesias, burning sensations in both feet up to the ankles. He has a gait disturbance, uses a cane for ambulation, he has not had any falls. The patient reports that he began having some issues with paresthesias and discomfort in the hands that began in February 2016. Overall, the pain level has significantly worsened over the last 4 weeks. The patient does have some low back pain issues as well. He reports that he has some urinary urgency and occasional incontinence associated with benign prostate enlargement. He also has irritable bowel syndrome and a history of anxiety and panic disorder, followed by Dr. Sheralyn Boatman. The patient denies that he drops things out of his hands. He does not operate a motor vehicle mainly because of his panic disorder. He is sent to this office for an evaluation. Blood work that includes a thyroid profile and a vitamin B12 level was unremarkable. The patient is not able to sleep well at night, he is on gabapentin taking 300 mg 3 times daily. He is on amitriptyline taking 75 mg at night.  Past Medical History  Diagnosis Date  . Arthritis   . SOB (shortness of breath)   . Pulmonary asbestosis   . Hernia of unspecified site of abdominal cavity without mention of obstruction or gangrene     hiatal  . GERD (gastroesophageal reflux disease)   . Depression   . Diverticulitis     hospital  [facility]  . Diverticulosis   . Reflex sympathetic dystrophy   . 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 Hypertension   . IBS (irritable bowel syndrome)   . Peripheral vascular disease     ankle brachial index of 0.79 on the right and 0.61 on the left.   . Myocardial infarction 01/2014    NSTEMI  . Hyperlipidemia LDL goal <70 04/20/2014  . AAA (abdominal aortic aneurysm), stable 04/20/2014  . 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 Cancer     Past Surgical History  Procedure Laterality Date  . Nissen fundoplication    . Right elbow surgery      x 2  . Right knee arthroscopy    . Left shoulder       x 3  . Squamous cell skin cancer      Left Hand  . Cataract extraction    . Supraventricular tachycardia ablation N/A 01/16/2014    Procedure: SUPRAVENTRICULAR TACHYCARDIA ABLATION;  Surgeon: Evans Lance, MD;  Location[facility] LAB;  Service: Cardiovascular;  Laterality: N/A;  . Left heart catheterization with coronary angiogram N/A 01/17/2014    Procedure: LEFT HEART CATHETERIZATION WITH CORONARY ANGIOGRAM;  Surgeon: Leonie Man, MD;  Location: [facility] CATH LAB;  Service: Cardiovascular;  Laterality: N/A;  . Hernia repair      Family History  Problem Relation Age of Onset  . Colon cancer Mother   . Colon cancer Maternal Aunt   . Colon cancer  Maternal Uncle   . Heart attack Neg Hx   . Stroke Neg Hx     Social history:  reports that he quit smoking about 9 years ago. His smoking use included Cigarettes. He has a 125 pack-year smoking history. He quit smokeless tobacco use about 2 years ago. He reports that he does not drink alcohol or use illicit drugs.  Medications:  Prior to Admission medications   Medication Sig Start Date End Date Taking? Authorizing Provider  alprazolam [name]) 2 MG tablet Take 1 tablet (2 mg total) by mouth 2 (two) times daily. 05/02/14  Yes Theodis Blaze, MD  amitriptyline (ELAVIL) 75 MG tablet Take 75 mg by mouth at bedtime.   Yes Historical Provider, MD    amLODipine (NORVASC) 2.5 MG tablet Take 2.5 mg by mouth every evening.   Yes Historical Provider, MD  aspirin EC 81 MG tablet Take 1 tablet (81 mg total) by mouth daily. Or Enteric Coated (EC) 81 mg 11/23/14  Yes Rhonda G Barrett, PA-C  atorvastatin (LIPITOR) 40 MG tablet Take 1 tablet (40 mg total) by mouth daily. 10/18/14  Yes Evans Lance, MD  finasteride (PROSCAR) 5 MG tablet Take 5 mg by mouth every morning.    Yes Historical Provider, MD  gabapentin (NEURONTIN) 300 MG capsule One capsule twice during the day, 2 capsules at night 01/30/15  Yes Kathrynn Ducking, MD  latanoprost (XALATAN) 0.005 % ophthalmic solution Place 1 drop into both eyes at bedtime.   Yes Historical Provider, MD  metoprolol (LOPRESSOR) 50 MG tablet Take 25 mg by mouth 2 (two) times daily.   Yes Historical Provider, MD  nitroGLYCERIN (NITROSTAT) 0.4 MG SL tablet Place 1 tablet (0.4 mg total) under the tongue every 5 (five) minutes x 3 doses as needed for chest pain. 04/20/14  Yes [name], NP  tamsulosin (FLOMAX) 0.4 MG CAPS capsule Take 0.4 mg by mouth at bedtime.   Yes Historical Provider, MD      Allergies  Allergen Reactions  . Codeine Itching    ROS:  Out of a complete 14 system review of symptoms, the patient complains only of the following symptoms, and all other reviewed systems are negative.  Fatigue Swelling in the legs Difficulty swallowing Itching Eye discomfort Diarrhea, constipation Urination problems Feeling hot, cold Joint pain, joint swelling, aching muscles Skin sensitivity Headache, numbness, weakness, difficulty swallowing Depression, anxiety, not enough sleep, decreased energy, change in appetite, disinterest in activities Sleepiness  Blood pressure 132/81, pulse 70, height 6' (1.829 m), weight 206 lb 8 oz (93.668 kg).  Physical Exam  General: The patient is alert and cooperative at the time of the examination.  Eyes: Pupils are equal, round, and reactive to light. Discs  are flat bilaterally.  Neck: The neck is supple, no carotid bruits are noted.  Respiratory: The respiratory examination is clear.  Cardiovascular: The cardiovascular examination reveals a regular rate and rhythm, no obvious murmurs or rubs are noted.  Skin: Extremities are without significant edema.  Neurologic Exam  Mental status: The patient is alert and oriented x 3 at the time of the examination. The patient has apparent normal recent and remote memory, with an apparently normal attention span and concentration ability.  Cranial nerves: Facial symmetry is present. There is good sensation of the face to pinprick and soft touch bilaterally. The strength of the facial muscles and the muscles to head turning and shoulder shrug are normal bilaterally. Speech is well enunciated, no aphasia or dysarthria  is noted. Extraocular movements are full. Visual fields are full. The tongue is midline, and the patient has symmetric elevation of the soft palate. No obvious hearing deficits are noted.  Motor: The motor testing reveals 5 over 5 strength of all 4 extremities. Good symmetric motor tone is noted throughout.  Sensory: Sensory testing is intact to pinprick, soft touch, vibration sensation, and position sense on the upper extremities. With the lower extremities, there is a stocking pattern pinprick sensory deficit one half way up the legs bilaterally. Vibration sensation and position sensation are significantly impaired in the feet. No evidence of extinction is noted.  Coordination: Cerebellar testing reveals good finger-nose-finger and heel-to-shin bilaterally.  Gait and station: Gait is slightly wide-based, slow and deliberate. The patient uses a cane for ambulation. Tandem gait is unsteady. Romberg is negative. No drift is seen.  Reflexes: Deep tendon reflexes are symmetric, but are depressed bilaterally. Toes are downgoing bilaterally.   Assessment/Plan:  1. Peripheral neuropathy  2.  Gait disturbance  The patient is having ongoing discomfort in the feet and hands that is worsening over time. He is already on gabapentin and amitriptyline without complete benefit. We will go up on the gabapentin taking 300 mg twice during the day, 600 mg at night. A prescription was written for this. The patient will be set up for nerve conduction studies of all 4 studies, EMG of 1 lower extremity. The patient will undergo blood work today. He will follow-up with EMG evaluation.  Jill Alexanders MD 01/30/2015 7:54 PM  [facility] [address] [place], [place] [address]  Phone [phone] Fax [phone]

## 2015-01-30 NOTE — Patient Instructions (Addendum)
We will get blood work today, and check EMG and NCV evaluation of the nerves of the arms and legs. We will go up on the gabapentin to 300 mg twice during the day and 2 at night.  Fall Prevention and Home Safety Falls cause injuries and can affect all age groups. It is possible to use preventive measures to significantly decrease the likelihood of falls. There are many simple measures which can make your home safer and prevent falls. OUTDOORS  Repair cracks and edges of walkways and driveways.  Remove high doorway thresholds.  Trim shrubbery on the main path into your home.  Have good outside lighting.  Clear walkways of tools, rocks, debris, and clutter.  Check that handrails are not broken and are securely fastened. Both sides of steps should have handrails.  Have leaves, snow, and ice cleared regularly.  Use sand or salt on walkways during winter months.  In the garage, clean up grease or oil spills. BATHROOM  Install night lights.  Install grab bars by the toilet and in the tub and shower.  Use non-skid mats or decals in the tub or shower.  Place a plastic non-slip stool in the shower to sit on, if needed.  Keep floors dry and clean up all water on the floor immediately.  Remove soap buildup in the tub or shower on a regular basis.  Secure bath mats with non-slip, double-sided rug tape.  Remove throw rugs and tripping hazards from the floors. BEDROOMS  Install night lights.  Make sure a bedside light is easy to reach.  Do not use oversized bedding.  Keep a telephone by your bedside.  Have a firm chair with side arms to use for getting dressed.  Remove throw rugs and tripping hazards from the floor. KITCHEN  Keep handles on pots and pans turned toward the center of the stove. Use back burners when possible.  Clean up spills quickly and allow time for drying.  Avoid walking on wet floors.  Avoid hot utensils and knives.  Position shelves so they are  not too high or low.  Place commonly used objects within easy reach.  If necessary, use a sturdy step stool with a grab bar when reaching.  Keep electrical cables out of the way.  Do not use floor polish or wax that makes floors slippery. If you must use wax, use non-skid floor wax.  Remove throw rugs and tripping hazards from the floor. STAIRWAYS  Never leave objects on stairs.  Place handrails on both sides of stairways and use them. Fix any loose handrails. Make sure handrails on both sides of the stairways are as long as the stairs.  Check carpeting to make sure it is firmly attached along stairs. Make repairs to worn or loose carpet promptly.  Avoid placing throw rugs at the top or bottom of stairways, or properly secure the rug with carpet tape to prevent slippage. Get rid of throw rugs, if possible.  Have an electrician put in a light switch at the top and bottom of the stairs. OTHER FALL PREVENTION TIPS  Wear low-heel or rubber-soled shoes that are supportive and fit well. Wear closed toe shoes.  When using a stepladder, make sure it is fully opened and both spreaders are firmly locked. Do not climb a closed stepladder.  Add color or contrast paint or tape to grab bars and handrails in your home. Place contrasting color strips on first and last steps.  Learn and use mobility aids as  needed. Install an electrical emergency response system.  Turn on lights to avoid dark areas. Replace light bulbs that burn out immediately. Get light switches that glow.  Arrange furniture to create clear pathways. Keep furniture in the same place.  Firmly attach carpet with non-skid or double-sided tape.  Eliminate uneven floor surfaces.  Select a carpet pattern that does not visually hide the edge of steps.  Be aware of all pets. OTHER HOME SAFETY TIPS  Set the water temperature for 120 F (48.8 C).  Keep emergency numbers on or near the telephone.  Keep smoke detectors on  every level of the home and near sleeping areas. Document Released: 04/11/2002 Document Revised: 10/21/2011 Document Reviewed: 07/11/2011 [facility] Patient Information 2015 [place], [place]. This information is not intended to replace advice given to you by your health care provider. Make sure you discuss any questions you have with your health care provider.

## 2015-02-01 ENCOUNTER — Telehealth: Payer: Self-pay

## 2015-02-01 LAB — MULTIPLE MYELOMA PANEL, SERUM
Albumin SerPl Elph-Mcnc: 3.7 g/dL (ref 2.9–4.4)
Albumin/Glob SerPl: 1 (ref 0.7–1.7)
Alpha 1: 0.2 g/dL (ref 0.0–0.4)
Alpha2 Glob SerPl Elph-Mcnc: 0.8 g/dL (ref 0.4–1.0)
B-GLOBULIN SERPL ELPH-MCNC: 1.3 g/dL (ref 0.7–1.3)
GAMMA GLOB SERPL ELPH-MCNC: 1.6 g/dL (ref 0.4–1.8)
GLOBULIN, TOTAL: 3.9 g/dL (ref 2.2–3.9)
IGA/IMMUNOGLOBULIN A, SERUM: 322 mg/dL (ref 61–437)
IgG (Immunoglobin G), Serum: 1639 mg/dL — ABNORMAL HIGH (ref 700–1600)
IgM (Immunoglobulin M), Srm: 81 mg/dL (ref 20–172)
TOTAL PROTEIN: 7.6 g/dL (ref 6.0–8.5)

## 2015-02-01 LAB — ANGIOTENSIN CONVERTING ENZYME: Angio Convert Enzyme: 31 U/L (ref 14–82)

## 2015-02-01 LAB — COPPER, SERUM: Copper: 123 ug/dL (ref 72–166)

## 2015-02-01 LAB — RHEUMATOID FACTOR: Rhuematoid fact SerPl-aCnc: <10 IU/mL (ref 0.0–13.9)

## 2015-02-01 LAB — ANA W/REFLEX: Anti Nuclear Antibody(ANA): NEGATIVE

## 2015-02-01 NOTE — Telephone Encounter (Signed)
I left voicemail asking patient to call back for lab work results. Labs are normal. Please tell patient when he calls.

## 2015-02-01 NOTE — Telephone Encounter (Signed)
Pt called back , he was told lab work was normal. While on the phone he wanted physician to know that his hands and feet are hurting worse. [phone]

## 2015-02-01 NOTE — Telephone Encounter (Signed)
-----   Message from Kathrynn Ducking, MD sent at 02/01/2015 11:17 AM EDT -----  The blood work results are unremarkable. Please call the patient.  ----- Message -----    From: [facility] Lab Results In Interface    Sent: 01/31/2015   7:42 AM      To: Kathrynn Ducking, MD

## 2015-02-01 NOTE — Telephone Encounter (Signed)
I called the patient and left voicemail advising he make the changes to his medications Dr. [name] prescribed yesterday and call back after a week or so if he cannot tell a difference.

## 2015-02-05 NOTE — Addendum Note (Signed)
Addended by: Margette Fast on: 02/05/2015 06:11 PM   Modules accepted: Medications

## 2015-02-05 NOTE — Telephone Encounter (Signed)
I called the patient back and explained Dr. Guadelupe Sabin message. He verbalized understanding and stated he wasn't sure he would make it a whole week with the pain he is having. I advised that I would send this message to Dr. [name] as well, but he will not be in the office until Wednesday this week.

## 2015-02-05 NOTE — Telephone Encounter (Signed)
I called patient. He is having increasing problems with discomfort in the feet and hands. We will go up on the gabapentin taking 600 mg 3 times daily. I will see him back for EMG and nerve conduction study evaluation.

## 2015-02-05 NOTE — Telephone Encounter (Signed)
Please call patient back. It can take up to 2 weeks for the medication to take full effect. Gabapentin is not typically something that causes constipation. Please ask him to increase his water intake to stay better hydrated. I would not suggest changing it quite yet. He may be able to tolerate it better in a week or so from now. In fact, he may be able to increase it down the road which can help his symptoms.

## 2015-02-05 NOTE — Telephone Encounter (Signed)
Pt called stating his feet and hands are "on fire". He increased Neurontin as directed on 01/30/15. He said the only relief he can get is to put hands and feet under covers but when the air hits them they start burning again. He also states he had to take a laxative since increasing Neurontin. Please call and advise. Patient can be reached at [phone]

## 2015-02-06 ENCOUNTER — Other Ambulatory Visit: Payer: Self-pay

## 2015-02-06 MED ORDER — DOCUSATE SODIUM 100 MG PO CAPS: 100.0000 mg | ORAL_CAPSULE | Freq: Two times a day (BID) | ORAL | Status: DC

## 2015-02-06 MED ORDER — OXCARBAZEPINE 150 MG PO TABS: 150.0000 mg | ORAL_TABLET | Freq: Two times a day (BID) | ORAL | Status: DC

## 2015-02-06 NOTE — Telephone Encounter (Signed)
Pt called stating he started taking gabapentin 600mg  3 times daily last night. He states today his body is burning in different places than hands and feet..stomach (he feels like it is inside bowels). He states his nerves can not take anymore. Laxative has helped. He states he is having panic attacks- 4 or 5 from yesterday to today. He has no quality of life. Please call and advise. He states seems like the more medicine he takes the more intense the burning is.

## 2015-02-06 NOTE — Telephone Encounter (Signed)
I called the patient. Per Dr. Krista Blue, patient may take Trileptal 75 mg twice daily for 3 days. If tolerating well, patient may take 150 mg twice daily. Rx has been sent to [facility]. She also ordered Colace 100 mg twice daily. I explained this to the patient. He stated he didn't know how much longer he could last with the pain. I advised that we are trying to help with the pain by increasing Gabapentin dose to 600 mg three times daily and adding Trileptal. I advised if the pain is too intense he can go to the ED. He stated he is not going to take the Colace. It burns his stomach up. He gave a long story about seeing a GI doctor. I advised he go see his GI doctor for his constipation. He stated he was not going to go see them. I advised he try the Trileptal for a few days and call if it does not help.

## 2015-02-08 ENCOUNTER — Telehealth: Payer: Self-pay | Admitting: Gastroenterology

## 2015-02-08 NOTE — Telephone Encounter (Addendum)
Pt called today stating Trileptal is not helping. He started taking it 02/06/15-1/2 am and pm. He states he hasn't slept but 2 hrs in 3 days. He complained of GI problems. Pt stated burning has not got any better but has gotten worse. He states he did take Colace. Pt said the more medication he takes the worse his hands and feet burn. He repeated the same issues to me as on other days I have talked to him. He complained of panic attacks. Pt states his son lives next door but is at [employer] this week in school. I advised him if he felt he could not handle the pain to call 911 to go to ED. Pt states he is going to ED. States he has not eaten in 3 or 4 days.

## 2015-02-08 NOTE — Telephone Encounter (Signed)
I called the patient, left a message. I will try to call back later. We may need to consider a fentanyl patch to help him.

## 2015-02-09 NOTE — Telephone Encounter (Signed)
Patient returned call

## 2015-02-09 NOTE — Telephone Encounter (Signed)
He called on Friday afternoon with 2 issues. 1.   He is still having a lot of burning pain. His Trileptal was recently started and he is still just taking half a pill. I told him to increase to 1 pill twice a day.  2.   He has constipation with last BM four days ago.[name] He has a laxative OTC at his house and is advised to take as directed on the package.

## 2015-02-09 NOTE — Telephone Encounter (Signed)
I called the patient. He continues to have burning in the hands and feet. The patient is still going up on the Trileptal, he remains on gabapentin taking 600 mg 3 times daily. He will convert to 150 mg twice daily of the Trileptal tomorrow, if he needs more 5 early next week, he will call, we will go up to 3 a day. Most of the time of the telephone conversation with this patient was spent talking about his stomach and his bowels rather than the hands and feet. The patient indicates that if he puts his hands and feet on the covers, he feels better. He will return for EMG and nerve conduction study evaluation.

## 2015-02-09 NOTE — Telephone Encounter (Signed)
I called the patient again, left a message, I will call back later. 

## 2015-02-10 ENCOUNTER — Telehealth: Payer: Self-pay | Admitting: Neurology

## 2015-02-10 NOTE — Telephone Encounter (Signed)
He called back again Saturday still having a lot of pain (went up to trileptal 150 mg po bid yesterday and tolerated it well).  Advised to go up to Trileptal 150 mg po in am and 300 mg at night.  Still having constipation.   He will take a laxative tonight.   If not better, he will discuss with Dr. Deatra Ina or Redmond Pulling

## 2015-02-10 NOTE — Telephone Encounter (Signed)
He is over a year out from his stent/NSTEMI. Ok to stop plavix, especially if there is a possible GI problem. GT

## 2015-02-12 ENCOUNTER — Inpatient Hospital Stay (HOSPITAL_COMMUNITY)
Admission: EM | Admit: 2015-02-12 | Discharge: 2015-02-14 | DRG: 644 | Disposition: A | Discharge status: Home / Self Care | Payer: Medicare Other | Attending: Internal Medicine | Admitting: Internal Medicine

## 2015-02-12 ENCOUNTER — Inpatient Hospital Stay (HOSPITAL_COMMUNITY): Payer: Medicare Other

## 2015-02-12 ENCOUNTER — Telehealth: Payer: Self-pay | Admitting: Neurology

## 2015-02-12 ENCOUNTER — Encounter (HOSPITAL_COMMUNITY): Payer: Self-pay | Admitting: *Deleted

## 2015-02-12 DIAGNOSIS — E785 Hyperlipidemia, unspecified: Secondary | ICD-10-CM | POA: Diagnosis present

## 2015-02-12 DIAGNOSIS — J4489 Other specified chronic obstructive pulmonary disease: Secondary | ICD-10-CM | POA: Diagnosis present

## 2015-02-12 DIAGNOSIS — N400 Enlarged prostate without lower urinary tract symptoms: Secondary | ICD-10-CM | POA: Diagnosis present

## 2015-02-12 DIAGNOSIS — T421X5A Adverse effect of iminostilbenes, initial encounter: Secondary | ICD-10-CM | POA: Diagnosis present

## 2015-02-12 DIAGNOSIS — I739 Peripheral vascular disease, unspecified: Secondary | ICD-10-CM | POA: Diagnosis present

## 2015-02-12 DIAGNOSIS — Z955 Presence of coronary angioplasty implant and graft: Secondary | ICD-10-CM

## 2015-02-12 DIAGNOSIS — I714 Abdominal aortic aneurysm, without rupture, unspecified: Secondary | ICD-10-CM | POA: Diagnosis present

## 2015-02-12 DIAGNOSIS — I10 Essential (primary) hypertension: Secondary | ICD-10-CM | POA: Diagnosis present

## 2015-02-12 DIAGNOSIS — Z85828 Personal history of other malignant neoplasm of skin: Secondary | ICD-10-CM | POA: Diagnosis not present

## 2015-02-12 DIAGNOSIS — G629 Polyneuropathy, unspecified: Secondary | ICD-10-CM | POA: Diagnosis present

## 2015-02-12 DIAGNOSIS — Z7982 Long term (current) use of aspirin: Secondary | ICD-10-CM | POA: Diagnosis not present

## 2015-02-12 DIAGNOSIS — J449 Chronic obstructive pulmonary disease, unspecified: Secondary | ICD-10-CM | POA: Diagnosis present

## 2015-02-12 DIAGNOSIS — R339 Retention of urine, unspecified: Secondary | ICD-10-CM | POA: Diagnosis present

## 2015-02-12 DIAGNOSIS — K219 Gastro-esophageal reflux disease without esophagitis: Secondary | ICD-10-CM | POA: Diagnosis present

## 2015-02-12 DIAGNOSIS — E222 Syndrome of inappropriate secretion of antidiuretic hormone: Secondary | ICD-10-CM | POA: Diagnosis present

## 2015-02-12 DIAGNOSIS — R001 Bradycardia, unspecified: Secondary | ICD-10-CM | POA: Diagnosis present

## 2015-02-12 DIAGNOSIS — I2510 Atherosclerotic heart disease of native coronary artery without angina pectoris: Secondary | ICD-10-CM | POA: Diagnosis present

## 2015-02-12 DIAGNOSIS — G9050 Complex regional pain syndrome I, unspecified: Secondary | ICD-10-CM | POA: Diagnosis present

## 2015-02-12 DIAGNOSIS — R330 Drug induced retention of urine: Secondary | ICD-10-CM | POA: Diagnosis present

## 2015-02-12 DIAGNOSIS — E871 Hypo-osmolality and hyponatremia: Secondary | ICD-10-CM | POA: Diagnosis present

## 2015-02-12 DIAGNOSIS — Z95810 Presence of automatic (implantable) cardiac defibrillator: Secondary | ICD-10-CM

## 2015-02-12 DIAGNOSIS — Z87891 Personal history of nicotine dependence: Secondary | ICD-10-CM

## 2015-02-12 DIAGNOSIS — I252 Old myocardial infarction: Secondary | ICD-10-CM | POA: Diagnosis not present

## 2015-02-12 DIAGNOSIS — Y92009 Unspecified place in unspecified non-institutional (private) residence as the place of occurrence of the external cause: Secondary | ICD-10-CM | POA: Diagnosis not present

## 2015-02-12 DIAGNOSIS — F329 Major depressive disorder, single episode, unspecified: Secondary | ICD-10-CM | POA: Diagnosis present

## 2015-02-12 DIAGNOSIS — K5900 Constipation, unspecified: Secondary | ICD-10-CM

## 2015-02-12 LAB — CBC WITH DIFFERENTIAL/PLATELET
BASOS PCT: 1 %
Basophils Absolute: 0 10*3/uL (ref 0.0–0.1)
EOS ABS: 0.1 10*3/uL (ref 0.0–0.7)
EOS PCT: 2 %
HCT: 36.2 % — ABNORMAL LOW (ref 39.0–52.0)
Hemoglobin: 12.5 g/dL — ABNORMAL LOW (ref 13.0–17.0)
Lymphocytes Relative: 17 %
Lymphs Abs: 0.9 10*3/uL (ref 0.7–4.0)
MCH: 29.3 pg (ref 26.0–34.0)
MCHC: 34.5 g/dL (ref 30.0–36.0)
MCV: 84.8 fL (ref 78.0–100.0)
MONO ABS: 0.8 10*3/uL (ref 0.1–1.0)
MONOS PCT: 14 %
Neutro Abs: 3.7 10*3/uL (ref 1.7–7.7)
Neutrophils Relative %: 66 %
Platelets: 239 10*3/uL (ref 150–400)
RBC: 4.27 MIL/uL (ref 4.22–5.81)
RDW: 13 % (ref 11.5–15.5)
WBC: 5.7 10*3/uL (ref 4.0–10.5)

## 2015-02-12 LAB — BASIC METABOLIC PANEL
Anion gap: 11 (ref 5–15)
BUN: 8 mg/dL (ref 6–20)
CALCIUM: 9.3 mg/dL (ref 8.9–10.3)
CO2: 24 mmol/L (ref 22–32)
CREATININE: 1.08 mg/dL (ref 0.61–1.24)
Chloride: 86 mmol/L — ABNORMAL LOW (ref 101–111)
Glucose, Bld: 90 mg/dL (ref 65–99)
Potassium: 4.3 mmol/L (ref 3.5–5.1)
SODIUM: 121 mmol/L — AB (ref 135–145)

## 2015-02-12 LAB — HEPATIC FUNCTION PANEL
ALT: 24 U/L (ref 17–63)
AST: 24 U/L (ref 15–41)
Albumin: 3.7 g/dL (ref 3.5–5.0)
Alkaline Phosphatase: 81 U/L (ref 38–126)
BILIRUBIN TOTAL: 0.6 mg/dL (ref 0.3–1.2)
Total Protein: 7.4 g/dL (ref 6.5–8.1)

## 2015-02-12 LAB — NA AND K (SODIUM & POTASSIUM), RAND UR
POTASSIUM UR: 42 mmol/L
SODIUM UR: 50 mmol/L

## 2015-02-12 LAB — URINALYSIS, ROUTINE W REFLEX MICROSCOPIC
BILIRUBIN URINE: NEGATIVE
Glucose, UA: NEGATIVE mg/dL
HGB URINE DIPSTICK: NEGATIVE
KETONES UR: NEGATIVE mg/dL
Leukocytes, UA: NEGATIVE
NITRITE: NEGATIVE
PROTEIN: NEGATIVE mg/dL
SPECIFIC GRAVITY, URINE: 1.012 (ref 1.005–1.030)
UROBILINOGEN UA: 0.2 mg/dL (ref 0.0–1.0)
pH: 7 (ref 5.0–8.0)

## 2015-02-12 LAB — OSMOLALITY, URINE: Osmolality, Ur: 384 mOsm/kg — ABNORMAL LOW (ref 390–1090)

## 2015-02-12 MED ORDER — SODIUM CHLORIDE 0.9 % IV SOLN
Freq: Once | INTRAVENOUS | Status: AC
  Administered 2015-02-12: 20:00:00 via INTRAVENOUS

## 2015-02-12 MED ORDER — ACETAMINOPHEN 650 MG RE SUPP
650.0000 mg | Freq: Four times a day (QID) | RECTAL | Status: DC | PRN
Start: 2015-02-12 — End: 2015-02-14

## 2015-02-12 MED ORDER — ALPRAZOLAM 0.5 MG PO TABS
2.0000 mg | ORAL_TABLET | Freq: Two times a day (BID) | ORAL | Status: DC
  Administered 2015-02-12 – 2015-02-14 (×4): 2 mg via ORAL
  Filled 2015-02-12 (×4): qty 4

## 2015-02-12 MED ORDER — ACETAMINOPHEN 325 MG PO TABS
650.0000 mg | ORAL_TABLET | Freq: Four times a day (QID) | ORAL | Status: DC | PRN
  Administered 2015-02-12: 650 mg via ORAL
  Filled 2015-02-12: qty 2

## 2015-02-12 MED ORDER — FINASTERIDE 5 MG PO TABS
5.0000 mg | ORAL_TABLET | Freq: Every day | ORAL | Status: DC
Start: 2015-02-13 — End: 2015-02-14
  Administered 2015-02-13 – 2015-02-14 (×2): 5 mg via ORAL
  Filled 2015-02-12 (×2): qty 1

## 2015-02-12 MED ORDER — OXCARBAZEPINE 300 MG PO TABS
ORAL_TABLET | ORAL | Status: DC
Start: 2015-02-12 — End: 2015-02-14

## 2015-02-12 MED ORDER — SODIUM CHLORIDE 0.9 % IV SOLN
INTRAVENOUS | Status: AC
  Administered 2015-02-12: 23:00:00 via INTRAVENOUS

## 2015-02-12 MED ORDER — GABAPENTIN 300 MG PO CAPS
600.0000 mg | ORAL_CAPSULE | Freq: Three times a day (TID) | ORAL | Status: DC
  Administered 2015-02-12 – 2015-02-14 (×6): 600 mg via ORAL
  Filled 2015-02-12 (×6): qty 2

## 2015-02-12 MED ORDER — NITROGLYCERIN 0.4 MG SL SUBL: 0.4000 mg | SUBLINGUAL_TABLET | SUBLINGUAL | Status: DC | PRN

## 2015-02-12 MED ORDER — ENOXAPARIN SODIUM 40 MG/0.4ML ~~LOC~~ SOLN
40.0000 mg | SUBCUTANEOUS | Status: DC
  Administered 2015-02-13 – 2015-02-14 (×2): 40 mg via SUBCUTANEOUS
  Filled 2015-02-12 (×3): qty 0.4

## 2015-02-12 MED ORDER — SODIUM CHLORIDE 0.9 % IJ SOLN
3.0000 mL | Freq: Two times a day (BID) | INTRAMUSCULAR | Status: DC
  Administered 2015-02-13: 3 mL via INTRAVENOUS

## 2015-02-12 MED ORDER — LATANOPROST 0.005 % OP SOLN
1.0000 [drp] | Freq: Every day | OPHTHALMIC | Status: DC
  Administered 2015-02-12 – 2015-02-13 (×2): 1 [drp] via OPHTHALMIC
  Filled 2015-02-12: qty 2.5

## 2015-02-12 MED ORDER — DOCUSATE SODIUM 100 MG PO CAPS
100.0000 mg | ORAL_CAPSULE | Freq: Two times a day (BID) | ORAL | Status: DC
  Administered 2015-02-12 – 2015-02-14 (×4): 100 mg via ORAL
  Filled 2015-02-12 (×4): qty 1

## 2015-02-12 MED ORDER — TAMSULOSIN HCL 0.4 MG PO CAPS
0.4000 mg | ORAL_CAPSULE | Freq: Every day | ORAL | Status: DC
  Administered 2015-02-12 – 2015-02-13 (×2): 0.4 mg via ORAL
  Filled 2015-02-12 (×2): qty 1

## 2015-02-12 MED ORDER — ONDANSETRON HCL 4 MG PO TABS: 4.0000 mg | ORAL_TABLET | Freq: Four times a day (QID) | ORAL | Status: DC | PRN

## 2015-02-12 MED ORDER — ASPIRIN EC 81 MG PO TBEC
81.0000 mg | DELAYED_RELEASE_TABLET | Freq: Every day | ORAL | Status: DC
Start: 2015-02-13 — End: 2015-02-14
  Administered 2015-02-13 – 2015-02-14 (×2): 81 mg via ORAL
  Filled 2015-02-12 (×2): qty 1

## 2015-02-12 MED ORDER — ONDANSETRON HCL 4 MG/2ML IJ SOLN
4.0000 mg | Freq: Four times a day (QID) | INTRAMUSCULAR | Status: DC | PRN
  Administered 2015-02-13 (×2): 4 mg via INTRAVENOUS
  Filled 2015-02-12 (×2): qty 2

## 2015-02-12 MED ORDER — ATORVASTATIN CALCIUM 40 MG PO TABS
40.0000 mg | ORAL_TABLET | Freq: Every day | ORAL | Status: DC
  Administered 2015-02-13 – 2015-02-14 (×2): 40 mg via ORAL
  Filled 2015-02-12 (×2): qty 1

## 2015-02-12 MED ORDER — BISACODYL 5 MG PO TBEC
5.0000 mg | DELAYED_RELEASE_TABLET | Freq: Every day | ORAL | Status: DC | PRN
  Filled 2015-02-12: qty 1

## 2015-02-12 MED ORDER — METOPROLOL TARTRATE 25 MG PO TABS
25.0000 mg | ORAL_TABLET | Freq: Two times a day (BID) | ORAL | Status: DC
  Administered 2015-02-12 – 2015-02-13 (×2): 25 mg via ORAL
  Filled 2015-02-12 (×2): qty 1

## 2015-02-12 MED ORDER — AMITRIPTYLINE HCL 50 MG PO TABS
75.0000 mg | ORAL_TABLET | Freq: Every day | ORAL | Status: DC
  Administered 2015-02-12 – 2015-02-13 (×2): 75 mg via ORAL
  Filled 2015-02-12 (×2): qty 2

## 2015-02-12 MED ORDER — AMLODIPINE BESYLATE 2.5 MG PO TABS
2.5000 mg | ORAL_TABLET | Freq: Every evening | ORAL | Status: DC
  Administered 2015-02-12 – 2015-02-14 (×2): 2.5 mg via ORAL
  Filled 2015-02-12 (×2): qty 1

## 2015-02-12 MED ORDER — OXCARBAZEPINE 150 MG PO TABS
150.0000 mg | ORAL_TABLET | Freq: Every day | ORAL | Status: DC
  Administered 2015-02-13: 150 mg via ORAL
  Filled 2015-02-12 (×3): qty 1

## 2015-02-12 NOTE — Telephone Encounter (Signed)
Pt called and complained that his hands and feet are on fire. Pt stated that he had a small BM last night but he still feels constipated. Pt was asked if his belly still hurt, " everything hurts". Pt states that " something has got to be done or else Im going to lay here and die".

## 2015-02-12 NOTE — ED Notes (Signed)
Multiple complaints. Reports having enlarged prostate and unable to urinate today. Has neuropathy and stinging/burning pain to hands and feet, no relief with meds at home. Also reports recent constipation, no relief with laxatives. Had small bowel movement last night, urinated small amount around 12:00.

## 2015-02-12 NOTE — ED Notes (Signed)
Report attempted 

## 2015-02-12 NOTE — H&P (Signed)
[facility] History and Physical  [name] WER:[id number] DOB: Oct 12, 1945 DOA: 02/12/2015  Referring physician: Dr.Knott. PCP: [name], MD  Specialists: Dr.Willis.  Chief Complaint: Urinary retention.  HPI: Curtis [name] is a 69 y.o. male with history of CAD status post stenting, ICD status post ablation, BPH, peripheral neuropathy being followed by Dr. [name] was recently placed on Trileptal for worsening neuropathy presents to the ER because of worsening tingling and numbness and also difficulty urinating and constipation. Patient states over the last couple of days patient was unable to urinate and in the ER bladder scan showed 350 mL urinary retention following which Foley catheter was placed. Lab work shows severe hyponatremia of 121. Patient states she has not been eating well for last few days. Urine study shows urine sodium of 50 with urine all smaller D of 384 which is more consistent with SIADH picture. Patient will be admitted for further observation. On exam patient also hasn't skin rash on his both hands which patient states is new.  Review of Systems: As presented in the history of presenting illness, rest negative.  Past Medical History  Diagnosis Date  . Arthritis   . SOB (shortness of breath)   . 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 Hernia of unspecified site of abdominal cavity without mention of obstruction or gangrene     hiatal  . GERD (gastroesophageal reflux disease)   . Depression   . Diverticulitis     hospital [facility]  . Diverticulosis   . Reflex sympathetic dystrophy   . 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 Hypertension   . IBS (irritable bowel syndrome)   . Peripheral vascular disease ([facility])     ankle brachial index of 0.79 on the right and 0.61 on the left.   . Myocardial infarction ([place]) 01/2014    NSTEMI  . Hyperlipidemia LDL goal <70 04/20/2014  . AAA (abdominal aortic aneurysm), stable 04/20/2014  . 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 Cancer [facility])    Past Surgical History  Procedure Laterality Date  . Nissen fundoplication    . Right elbow surgery      x 2  . Right knee arthroscopy    . Left shoulder       x 3  . Squamous cell skin cancer      Left Hand  . Cataract extraction    . Supraventricular tachycardia ablation N/A 01/16/2014    Procedure: SUPRAVENTRICULAR TACHYCARDIA ABLATION;  Surgeon: Evans Lance, MD;  Location: [facility] CATH LAB;  Service: Cardiovascular;  Laterality: N/A;  . Left heart catheterization with coronary angiogram N/A 01/17/2014    Procedure: LEFT HEART CATHETERIZATION WITH CORONARY ANGIOGRAM;  Surgeon: Leonie Man, MD;  Location: [facility] CATH LAB;  Service: Cardiovascular;  Laterality: N/A;  . Hernia repair    . Cardiac catheterization    . Coronary angioplasty     Social History:  reports that he quit smoking about 9 years ago. His smoking use included Cigarettes. He has a 125 pack-year smoking history. He quit smokeless tobacco use about 2 years ago. He reports that he does not drink alcohol or use illicit drugs. Where does patient live home. Can patient participate in ADLs? Yes.  Allergies  Allergen Reactions  . Codeine Itching    Family History:  Family History  Problem Relation Age of Onset  . Colon cancer Mother   . Colon cancer Maternal Aunt   . Colon cancer Maternal Uncle   . Heart  attack Neg Hx   . Stroke Neg Hx       Prior to Admission medications   Medication Sig Start Date End Date Taking? Authorizing Provider  alprazolam [name]) 2 MG tablet Take 1 tablet (2 mg total) by mouth 2 (two) times daily. 05/02/14  Yes Theodis Blaze, MD  amitriptyline (ELAVIL) 75 MG tablet Take 75 mg by mouth at bedtime.   Yes Historical Provider, MD  amLODipine (NORVASC) 2.5 MG tablet Take 2.5 mg by mouth every evening.   Yes Historical Provider, MD  aspirin EC 81 MG tablet Take 1 tablet (81 mg total) by mouth daily. Or Enteric Coated (EC) 81 mg 11/23/14  Yes Rhonda G Barrett, PA-C   atorvastatin (LIPITOR) 40 MG tablet Take 1 tablet (40 mg total) by mouth daily. 10/18/14  Yes Evans Lance, MD  bisacodyl (DULCOLAX) 5 MG EC tablet Take 5 mg by mouth daily as needed for mild constipation or moderate constipation.   Yes Historical Provider, MD  docusate sodium (COLACE) 100 MG capsule Take 1 capsule (100 mg total) by mouth 2 (two) times daily. 02/06/15  Yes Marcial Pacas, MD  finasteride (PROSCAR) 5 MG tablet Take 5 mg by mouth every morning.    Yes Historical Provider, MD  gabapentin (NEURONTIN) 300 MG capsule One capsule twice during the day, 2 capsules at night Patient taking differently: Take 600 mg by mouth 3 (three) times daily.  01/30/15  Yes Kathrynn Ducking, MD  latanoprost (XALATAN) 0.005 % ophthalmic solution Place 1 drop into both eyes at bedtime.   Yes Historical Provider, MD  metoprolol (LOPRESSOR) 50 MG tablet Take 25 mg by mouth 2 (two) times daily.   Yes Historical Provider, MD  nitroGLYCERIN (NITROSTAT) 0.4 MG SL tablet Place 1 tablet (0.4 mg total) under the tongue every 5 (five) minutes x 3 doses as needed for chest pain. 04/20/14  Yes [name], NP  OXcarbazepine (TRILEPTAL) 150 MG tablet Take 150 mg by mouth See admin instructions. Take one tablet by mouth in the morning, then take 2 tablets by mouth at night   Yes Historical Provider, MD  tamsulosin (FLOMAX) 0.4 MG CAPS capsule Take 0.4 mg by mouth at bedtime.   Yes Historical Provider, MD  Oxcarbazepine (TRILEPTAL) 300 MG tablet 1 tablet twice daily for 5 days, then take 1 tablet 3 times daily Patient not taking: Reported on 02/12/2015 02/12/15   Kathrynn Ducking, MD    Physical Exam: Filed Vitals:   02/12/15 2015 02/12/15 2030 02/12/15 2222 02/12/15 2225  BP: 168/87 185/93  175/73  Pulse: 73 81  86  Temp:    97.8 F (36.6 C)  TempSrc:    Oral  Resp:    20  Height:   6' (1.829 m)   Weight:   92.4 kg (203 lb 11.3 oz)   SpO2: 100% 100%  100%     General:  Moderately built and nourished.  Eyes:  Anicteric no pallor.  ENT: No discharge from the ears eyes nose and mouth.  Neck: No mass felt. No JVD appreciated.  Cardiovascular: S1 and S2 heard.  Respiratory: No rhonchi or crepitations.  Abdomen: Soft nontender bowel sounds present.  Skin: Rash on both hands.  Musculoskeletal: No edema. Has pain on moving his left knee which is chronic.  Psychiatric: Appears normal.  Neurologic: Alert awake oriented to time place and person. Moves all extremities.  Labs on Admission:  Basic Metabolic Panel:  Recent Labs Lab 02/12/15 1911  NA  121*  K 4.3  CL 86*  CO2 24  GLUCOSE 90  BUN 8  CREATININE 1.08  CALCIUM 9.3   Liver Function Tests:  Recent Labs Lab 02/12/15 2020  AST 24  ALT 24  ALKPHOS 81  BILITOT 0.6  PROT 7.4  ALBUMIN 3.7   No results for input(s): LIPASE, AMYLASE in the last 168 hours. No results for input(s): AMMONIA in the last 168 hours. CBC:  Recent Labs Lab 02/12/15 1911  WBC 5.7  NEUTROABS 3.7  HGB 12.5*  HCT 36.2*  MCV 84.8  PLT 239   Cardiac Enzymes: No results for input(s): CKTOTAL, CKMB, CKMBINDEX, TROPONINI in the last 168 hours.  BNP (last 3 results)  Recent Labs  04/27/14 1010  BNP 73.5    ProBNP (last 3 results)  Recent Labs  04/17/14 1440 04/24/14 1820  PROBNP 215.3* 330.3*    CBG: No results for input(s): GLUCAP in the last 168 hours.  Radiological Exams on Admission: Dg Abd 1 View  02/12/2015   CLINICAL DATA:  69 year old male with difficulty urinating and constipation for 3 weeks. Abdominal pain.  EXAM: ABDOMEN - 1 VIEW  COMPARISON:  11/09/2014 CT  FINDINGS: Nondistended gas-filled loops of small bowel and colon are noted.  There is no evidence of dilated bowel loops.  No suspicious calcifications are noted.  No acute bony abnormalities are noted. Degenerative changes in the lower lumbar spine are identified.  IMPRESSION: Nonspecific nonobstructive bowel gas pattern.   Electronically Signed   By: Margarette Canada  M.D.   On: 02/12/2015 21:02     Assessment/Plan Active Problems:   Reflex sympathetic dystrophy   COPD with chronic bronchitis (HCC)   HTN (hypertension)   CAD (coronary artery disease), native coronary artery   AAA (abdominal aortic aneurysm), stable   Hyponatremia   Urinary retention   Acute hyponatremia   1. Severe hyponatremia - urine sodium was 50 and osmolality is 384. 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 Trileptal can cause hyponatremia. I have discussed with on-call neurologist Dr. Leane Para as advised to wean off Trileptal by decreasing the dose over the next 2 days and stop. Closely follow metabolic panel. In addition we will check TSH cortisol and chest x-ray for other causes for hyponatremia. Presently will be on fluid restriction. 2. Urinary retention with history of BPH - Foley catheter has been placed. Continue finasteride and Flomax. Patient follows up with Dr. [name] and will need outpatient follow-up. 3. Peripheral neuropathy - is being followed by Dr. [name] and as outpatient schedule. At this time holding off Trileptal secondary to #1. Patient is on gabapentin and amitriptyline. 4. CAD status post stenting - denies any chest pain. On aspirin and Lipitor. 5. COPD - denies any shortness of breath and has no active wheezing. 6. Abdominal aortic aneurysm - denies any abdominal pain.  I have reviewed patient's old charts and labs. Chest x-ray is pending.   DVT Prophylaxis Lovenox.  Code Status: DO NOT INTUBATE.  Family Communication: Discussed patient's son.  Disposition Plan: Admit to inpatient.    Lorin Hauck N. [facility] Pager [phone].  If 7PM-7AM, please contact night-coverage [email or web address] Password [email or web address] 02/12/2015, 10:55 PM

## 2015-02-12 NOTE — Telephone Encounter (Signed)
Pt's son is calling and states that he is taking him to the ER as he is in a lot of pain.

## 2015-02-12 NOTE — ED Notes (Addendum)
Admitting MD at bedside.

## 2015-02-12 NOTE — ED Provider Notes (Signed)
CSN: [id number]     Arrival date & time 02/12/15  1652 History   First MD Initiated Contact with Patient 02/12/15 1757     Chief Complaint  Patient presents with  . Urinary Retention  . Constipation  . Pain     (Consider location/radiation/quality/duration/timing/severity/associated sxs/prior Treatment) HPI Comments: Patient has multiple symptoms including ongoing constipation, abdominal distention, worsening neuropathy, and has been consulting his primary medical team without improvement. He presents with main complaint of "cold hands and feet" that have been getting worse but states he feels "terrible" overall.  Patient is a 69 y.o. male presenting with male genitourinary complaint. The history is provided by the patient.  Male GU Problem Presenting symptoms comment:  Decreased urination Context: spontaneously   Relieved by:  Nothing Worsened by:  Nothing tried Ineffective treatments:  None tried Associated symptoms: abdominal pain (mild upper) and urinary retention   Associated symptoms: no fever, no groin pain, no penile swelling and no scrotal swelling   Risk factors comment:  Known chronic hypertrophic prostate   Past Medical History  Diagnosis Date  . Arthritis   . SOB (shortness of breath)   . 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 Hernia of unspecified site of abdominal cavity without mention of obstruction or gangrene     hiatal  . GERD (gastroesophageal reflux disease)   . Depression   . Diverticulitis     hospital 2011 [facility]  . Diverticulosis   . Reflex sympathetic dystrophy   . 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 Hypertension   . IBS (irritable bowel syndrome)   . Peripheral vascular disease (HCC)     ankle brachial index of 0.79 on the right and 0.61 on the left.   . Myocardial infarction ([place]) 01/2014    NSTEMI  . Hyperlipidemia LDL goal <70 04/20/2014  . AAA (abdominal aortic aneurysm), stable 04/20/2014  . 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 Cancer [facility])     Past Surgical History  Procedure Laterality Date  . Nissen fundoplication    . Right elbow surgery      x 2  . Right knee arthroscopy    . Left shoulder       x 3  . Squamous cell skin cancer      Left Hand  . Cataract extraction    . Supraventricular tachycardia ablation N/A 01/16/2014    Procedure: SUPRAVENTRICULAR TACHYCARDIA ABLATION;  Surgeon: Evans Lance, MD;  Location: [facility] CATH LAB;  Service: Cardiovascular;  Laterality: N/A;  . Left heart catheterization with coronary angiogram N/A 01/17/2014    Procedure: LEFT HEART CATHETERIZATION WITH CORONARY ANGIOGRAM;  Surgeon: Leonie Man, MD;  Location: [facility] CATH LAB;  Service: Cardiovascular;  Laterality: N/A;  . Hernia repair     Family History  Problem Relation Age of Onset  . Colon cancer Mother   . Colon cancer Maternal Aunt   . Colon cancer Maternal Uncle   . Heart attack Neg Hx   . Stroke Neg Hx    Social History  Substance Use Topics  . Smoking status: Former Smoker -- 2.50 packs/day for 50 years    Types: Cigarettes    Quit date: 05/05/2005  . Smokeless tobacco: Former Systems developer    Quit date: 05/03/2012  . Alcohol Use: No     Comment: last use 2013    Review of Systems  Constitutional: Negative for fever.  Gastrointestinal: Positive for abdominal pain (mild upper).  Genitourinary: Negative for penile swelling and scrotal swelling.  All other systems reviewed and are negative.     Allergies  Codeine  Home Medications   Prior to Admission medications   Medication Sig Start Date End Date Taking? Authorizing Provider  alprazolam [name]) 2 MG tablet Take 1 tablet (2 mg total) by mouth 2 (two) times daily. 05/02/14   Theodis Blaze, MD  amitriptyline (ELAVIL) 75 MG tablet Take 75 mg by mouth at bedtime.    Historical Provider, MD  amLODipine (NORVASC) 2.5 MG tablet Take 2.5 mg by mouth every evening.    Historical Provider, MD  aspirin EC 81 MG tablet Take 1 tablet (81 mg total) by mouth daily. Or Enteric  Coated (EC) 81 mg 11/23/14   Rhonda G Barrett, PA-C  atorvastatin (LIPITOR) 40 MG tablet Take 1 tablet (40 mg total) by mouth daily. 10/18/14   Evans Lance, MD  docusate sodium (COLACE) 100 MG capsule Take 1 capsule (100 mg total) by mouth 2 (two) times daily. 02/06/15   Marcial Pacas, MD  finasteride (PROSCAR) 5 MG tablet Take 5 mg by mouth every morning.     Historical Provider, MD  gabapentin (NEURONTIN) 300 MG capsule One capsule twice during the day, 2 capsules at night Patient taking differently: Take 600 mg by mouth 3 (three) times daily.  01/30/15   Kathrynn Ducking, MD  latanoprost (XALATAN) 0.005 % ophthalmic solution Place 1 drop into both eyes at bedtime.    Historical Provider, MD  metoprolol (LOPRESSOR) 50 MG tablet Take 25 mg by mouth 2 (two) times daily.    Historical Provider, MD  nitroGLYCERIN (NITROSTAT) 0.4 MG SL tablet Place 1 tablet (0.4 mg total) under the tongue every 5 (five) minutes x 3 doses as needed for chest pain. 04/20/14   [name], NP  Oxcarbazepine (TRILEPTAL) 300 MG tablet 1 tablet twice daily for 5 days, then take 1 tablet 3 times daily 02/12/15   Kathrynn Ducking, MD  tamsulosin (FLOMAX) 0.4 MG CAPS capsule Take 0.4 mg by mouth at bedtime.    Historical Provider, MD   BP 130/95 mmHg  Pulse 73  Temp(Src) 97.3 F (36.3 C) (Oral)  Resp 18  SpO2 97% Physical Exam  Constitutional: He is oriented to person, place, and time. He appears well-developed and well-nourished. He appears ill (chronically). No distress.  HENT:  Head: Normocephalic and atraumatic.  Eyes: Conjunctivae are normal.  Neck: Neck supple. No tracheal deviation present.  Cardiovascular: Normal rate, regular rhythm and normal heart sounds.   Pulmonary/Chest: Effort normal. No respiratory distress. He has no wheezes.  Abdominal: Soft. He exhibits no distension. There is no tenderness.  Genitourinary: Rectum normal. Rectal exam shows no external hemorrhoid and no internal hemorrhoid. Prostate is  enlarged.  Neurological: He is alert and oriented to person, place, and time. He has normal strength. No cranial nerve deficit or sensory deficit. Coordination normal. GCS eye subscore is 4. GCS verbal subscore is 5. GCS motor subscore is 6.  Skin: Skin is warm and dry.  Psychiatric: He has a normal mood and affect.    ED Course  Procedures (including critical care time) Emergency Focused Ultrasound Exam Limited ultrasound of the Bladder.   Performed and interpreted by Dr. Laneta Simmers Indication: evaluation for urinary retention Transverse and Sagittal views of the bladder are obtained and calculations are performed to determine an estimated bladder volume for signs of post-renal obstruction.  Findings: Bladder is full, 350cc+ Interpretation: evidence of outlet obstruction Images archived electronically.  CPT Codes: [phone] and  [place] Performed by: Leo Grosser Total critical care time: 30 minutes Critical care time was exclusive of separately billable procedures and treating other patients. Critical care was necessary to treat or prevent imminent or life-threatening deterioration. Critical care was time spent personally by me on the following activities: development of treatment plan with patient and/or surrogate as well as nursing, discussions with consultants, evaluation of patient's response to treatment, examination of patient, obtaining history from patient or surrogate, ordering and performing treatments and interventions, ordering and review of laboratory studies, ordering and review of radiographic studies, pulse oximetry and re-evaluation of patient's condition.   Labs Review Labs Reviewed  BASIC METABOLIC PANEL - Abnormal; Notable for the following:    Sodium 121 (*)    Chloride 86 (*)    All other components within normal limits  CBC WITH DIFFERENTIAL/PLATELET - Abnormal; Notable for the following:    Hemoglobin 12.5 (*)    HCT 36.2 (*)    All other components  within normal limits  HEPATIC FUNCTION PANEL - Abnormal; Notable for the following:    Bilirubin, Direct <0.1 (*)    All other components within normal limits  OSMOLALITY, URINE - Abnormal; Notable for the following:    Osmolality, Ur 384 (*)    All other components within normal limits  BASIC METABOLIC PANEL - Abnormal; Notable for the following:    Sodium 123 (*)    Potassium 3.4 (*)    Chloride 88 (*)    Glucose, Bld 104 (*)    All other components within normal limits  URINALYSIS, ROUTINE W REFLEX MICROSCOPIC (NOT AT [facility])  NA AND K (SODIUM & POTASSIUM), RAND UR  CBC  URIC ACID  BASIC METABOLIC PANEL  BASIC METABOLIC PANEL  BASIC METABOLIC PANEL  TSH  CORTISOL  CBC  BASIC METABOLIC PANEL    Imaging Review Dg Abd 1 View  02/12/2015   CLINICAL DATA:  69 year old male with difficulty urinating and constipation for 3 weeks. Abdominal pain.  EXAM: ABDOMEN - 1 VIEW  COMPARISON:  11/09/2014 CT  FINDINGS: Nondistended gas-filled loops of small bowel and colon are noted.  There is no evidence of dilated bowel loops.  No suspicious calcifications are noted.  No acute bony abnormalities are noted. Degenerative changes in the lower lumbar spine are identified.  IMPRESSION: Nonspecific nonobstructive bowel gas pattern.   Electronically Signed   By: Margarette Canada M.D.   On: 02/12/2015 21:02   I have personally reviewed and evaluated these images and lab results as part of my medical decision-making.   EKG Interpretation None      MDM   Final diagnoses:  Urinary retention  Acute hyponatremia  Constipation, unspecified constipation type    69 year old male with multiple vague complaints including acutely worsening peripheral neuropathy over the last week, ongoing constipation, urinary retention that started today. Screening labs sent for vague symptoms and frail elderly gentleman with multiple comorbidities, Foley catheter placed for acute urinary retention evident with elevated  postvoid residual on bedside ultrasound. Found to have acute hyponatremia with baseline above 130 at a level of 121. Will require inpatient slow correction and monitoring to avoid CPM. Urine osmolality and sodium sent for inpatient follow-up.    Leo Grosser, MD 02/13/15 [phone]

## 2015-02-12 NOTE — Telephone Encounter (Signed)
Please see phone message dated 02/10/15.

## 2015-02-12 NOTE — Telephone Encounter (Signed)
Pt called stating he is not any better. He did take Trileptal as directed by Dr Felecia Shelling on 02/10/15. He states he hasn't eaten anything in 3-4 days. Please call and advise. Patient can be reached at [phone].

## 2015-02-12 NOTE — Telephone Encounter (Signed)
I called the patient, talk to the son. The patient is on Trileptal taking 150 mg in the morning, 300 mg in the evening. I will increase this to 300 mg twice daily for 5 days, then go to 300 mg 3 times daily.

## 2015-02-12 NOTE — Addendum Note (Signed)
Addended by: Margette Fast on: 02/12/2015 06:00 PM   Modules accepted: Orders, Medications

## 2015-02-12 NOTE — Telephone Encounter (Signed)
Pt called stating his son is going to take him to [facility] ED. He said he could not tolerate pain anymore. Patient sounds distressed at this time. His voice is weak and crackly.

## 2015-02-13 ENCOUNTER — Inpatient Hospital Stay (HOSPITAL_COMMUNITY): Payer: Medicare Other

## 2015-02-13 LAB — BASIC METABOLIC PANEL
ANION GAP: 12 (ref 5–15)
ANION GAP: 6 (ref 5–15)
ANION GAP: 7 (ref 5–15)
ANION GAP: 8 (ref 5–15)
ANION GAP: 8 (ref 5–15)
BUN: 6 mg/dL (ref 6–20)
BUN: 6 mg/dL (ref 6–20)
BUN: 7 mg/dL (ref 6–20)
BUN: 7 mg/dL (ref 6–20)
BUN: 7 mg/dL (ref 6–20)
CALCIUM: 8.6 mg/dL — AB (ref 8.9–10.3)
CALCIUM: 8.8 mg/dL — AB (ref 8.9–10.3)
CALCIUM: 8.4 mg/dL — AB (ref 8.9–10.3)
CHLORIDE: 88 mmol/L — AB (ref 101–111)
CHLORIDE: 94 mmol/L — AB (ref 101–111)
CHLORIDE: 94 mmol/L — AB (ref 101–111)
CO2: 23 mmol/L (ref 22–32)
CO2: 23 mmol/L (ref 22–32)
CO2: 23 mmol/L (ref 22–32)
CO2: 24 mmol/L (ref 22–32)
CO2: 26 mmol/L (ref 22–32)
Calcium: 8.4 mg/dL — ABNORMAL LOW (ref 8.9–10.3)
Calcium: 9.1 mg/dL (ref 8.9–10.3)
Chloride: 92 mmol/L — ABNORMAL LOW (ref 101–111)
Chloride: 97 mmol/L — ABNORMAL LOW (ref 101–111)
Creatinine, Ser: 0.92 mg/dL (ref 0.61–1.24)
Creatinine, Ser: 0.94 mg/dL (ref 0.61–1.24)
Creatinine, Ser: 0.96 mg/dL (ref 0.61–1.24)
Creatinine, Ser: 1.01 mg/dL (ref 0.61–1.24)
Creatinine, Ser: 1.03 mg/dL (ref 0.61–1.24)
GFR calc Af Amer: >60 mL/min (ref >60)
GFR calc non Af Amer: >60 mL/min (ref >60)
GFR calc non Af Amer: >60 mL/min (ref >60)
GFR calc non Af Amer: >60 mL/min (ref >60)
GFR calc non Af Amer: >60 mL/min (ref >60)
GLUCOSE: 104 mg/dL — AB (ref 65–99)
GLUCOSE: 96 mg/dL (ref 65–99)
GLUCOSE: 99 mg/dL (ref 65–99)
Glucose, Bld: 105 mg/dL — ABNORMAL HIGH (ref 65–99)
Glucose, Bld: 103 mg/dL — ABNORMAL HIGH (ref 65–99)
POTASSIUM: 3.4 mmol/L — AB (ref 3.5–5.1)
POTASSIUM: 3.5 mmol/L (ref 3.5–5.1)
POTASSIUM: 3.7 mmol/L (ref 3.5–5.1)
POTASSIUM: 3.8 mmol/L (ref 3.5–5.1)
POTASSIUM: 3.8 mmol/L (ref 3.5–5.1)
SODIUM: 126 mmol/L — AB (ref 135–145)
Sodium: 123 mmol/L — ABNORMAL LOW (ref 135–145)
Sodium: 125 mmol/L — ABNORMAL LOW (ref 135–145)
Sodium: 125 mmol/L — ABNORMAL LOW (ref 135–145)
Sodium: 126 mmol/L — ABNORMAL LOW (ref 135–145)

## 2015-02-13 LAB — CBC
HEMATOCRIT: 36 % — AB (ref 39.0–52.0)
HEMATOCRIT: 40.3 % (ref 39.0–52.0)
HEMOGLOBIN: 14.3 g/dL (ref 13.0–17.0)
Hemoglobin: 12.6 g/dL — ABNORMAL LOW (ref 13.0–17.0)
MCH: 29.8 pg (ref 26.0–34.0)
MCH: 30.2 pg (ref 26.0–34.0)
MCHC: 35 g/dL (ref 30.0–36.0)
MCHC: 35.5 g/dL (ref 30.0–36.0)
MCV: 85 fL (ref 78.0–100.0)
MCV: 85.1 fL (ref 78.0–100.0)
PLATELETS: 233 10*3/uL (ref 150–400)
Platelets: 242 10*3/uL (ref 150–400)
RBC: 4.23 MIL/uL (ref 4.22–5.81)
RBC: 4.74 MIL/uL (ref 4.22–5.81)
RDW: 13 % (ref 11.5–15.5)
RDW: 13.1 % (ref 11.5–15.5)
WBC: 5.1 10*3/uL (ref 4.0–10.5)
WBC: 7.1 10*3/uL (ref 4.0–10.5)

## 2015-02-13 LAB — TSH: TSH: 1.496 u[IU]/mL (ref 0.350–4.500)

## 2015-02-13 LAB — URIC ACID: URIC ACID, SERUM: 5.4 mg/dL (ref 4.4–7.6)

## 2015-02-13 LAB — CORTISOL: CORTISOL PLASMA: 4.1 ug/dL

## 2015-02-13 MED ORDER — HYDRALAZINE HCL 20 MG/ML IJ SOLN
5.0000 mg | Freq: Once | INTRAMUSCULAR | Status: DC
  Filled 2015-02-13: qty 1

## 2015-02-13 NOTE — Progress Notes (Signed)
[facility]                                                                              Patient Demographics  Curtis [name], is a 69 y.o. male, DOB - Jul 24, 1945, QAS:[id number]  Admit date - 02/12/2015   Admitting Physician Leo Grosser, MD  Outpatient Primary MD for the patient is [name], MD  LOS - 1   Chief Complaint  Patient presents with  . Urinary Retention  . Constipation  . Pain      HPI on 02/12/2015 by Dr. [name] JAMAHL [name] is a 69 y.o. male with history of CAD status post stenting, ICD status post ablation, BPH, peripheral neuropathy being followed by Dr. [name] was recently placed on Trileptal for worsening neuropathy presents to the ER because of worsening tingling and numbness and also difficulty urinating and constipation. Patient states over the last couple of days patient was unable to urinate and in the ER bladder scan showed 350 mL urinary retention following which Foley catheter was placed. Lab work shows severe hyponatremia of 121. Patient states she has not been eating well for last few days. Urine study shows urine sodium of 50 with urine all smaller D of 384 which is more consistent with SIADH picture. Patient will be admitted for further observation. On exam patient also hasn't skin rash on his both hands which patient states is new.  Assessment & Plan   Severe hyponatremia -Upon admission, sodium 121 -Urine sodium 50, osmolality 384, consistent with SIADH -Patient recently started on increased level of Trileptal which could be the cause of his hyponatremia -Admitting physician spoke with neurology, Dr. [name], will wean off of Trileptal by decreasing dose over the next couple of days and then discontinue Trileptal -Continue to monitor BMP -TSH 1.496, PM cortisol 4.1 -Chest x-ray: Prior asbestos exposure, stable  Urinary retention with history of BPH -Foley catheter in place -Continue Flomax and finasteride -Patient  sees Dr. [name], urology  Peripheral neuropathy -Followed by a Dr. [name], neurology -Trileptal be weaned as noted above -Continue gabapentin and amitriptyline  Coronary artery disease -Currently denies chest pain -Continue statin and aspirin  COPD -Currently stable, denies any wheezing or shortness of breath  Abdominal aortic aneurysm -Stable  Code Status: DNI  Family Communication: None at bedside  Disposition Plan: Admitted, continue to monitor sodium   Time Spent in minutes   30 minutes  Procedures  None  Consults   Neurology via phone (by admitting physician)  DVT Prophylaxis  Lovenox  Lab Results  Component Value Date   PLT 233 02/13/2015    Medications  Scheduled Meds: . alprazolam  2 mg Oral BID  . amitriptyline  75 mg Oral QHS  . amLODipine  2.5 mg Oral QPM  . aspirin EC  81 mg Oral Daily  . atorvastatin  40 mg Oral Daily  . docusate sodium  100 mg Oral BID  . enoxaparin (LOVENOX) injection  40 mg Subcutaneous Q24H  . finasteride  5 mg Oral Daily  . gabapentin  600 mg Oral TID  . latanoprost  1 drop Both Eyes QHS  . metoprolol  25 mg Oral BID  . OXcarbazepine  150 mg Oral Daily  . sodium chloride  3 mL Intravenous Q12H  . tamsulosin  0.4 mg Oral QHS   Continuous Infusions:  PRN Meds:.acetaminophen **OR** acetaminophen, bisacodyl, nitroGLYCERIN, ondansetron **OR** ondansetron (ZOFRAN) IV  Antibiotics    Anti-infectives    None     Subjective:   Curtis [name] seen and examined today.  Patient denies any chest pain, shortness breath, abdominal pain at this time. States he's been having very bad neuropathy and tingling with numbness. Patient has had issues with IBS in the past.  Objective:   Filed Vitals:   02/12/15 2030 02/12/15 2222 02/12/15 2225 02/13/15 0552  BP: 185/93  175/73 143/66  Pulse: 81  86 64  Temp:   97.8 F (36.6 C) 97.4 F (36.3 C)  TempSrc:   Oral Oral  Resp:   20 18  Height:  6' (1.829 m)    Weight:  92.4 kg  (203 lb 11.3 oz)  92.4 kg (203 lb 11.3 oz)  SpO2: 100%  100% 100%    Wt Readings from Last 3 Encounters:  02/13/15 92.4 kg (203 lb 11.3 oz)  01/30/15 93.668 kg (206 lb 8 oz)  12/08/14 94.348 kg (208 lb)     Intake/Output Summary (Last 24 hours) at 02/13/15 1246 Last data filed at 02/13/15 0900  Gross per 24 hour  Intake 1018.34 ml  Output   2800 ml  Net -1781.66 ml    Exam  General: Well developed, well nourished, NAD, appears stated age  HEENT: NCAT, mucous membranes moist.   Neck: Supple, no JVD, no masses  Cardiovascular: S1 S2 auscultated, no rubs, murmurs or gallops. Regular rate and rhythm.  Respiratory: Clear to auscultation bilaterally with equal chest rise  Abdomen: Soft, obese, nontender, nondistended, + bowel sounds  Extremities: warm dry without cyanosis clubbing or edema. Pain with movement of left lower extremity.  Neuro: AAOx3, nonfocal  Skin: Rash on hands  Psych: Normal affect and demeanor  Data Review   Micro Results No results found for this or any previous visit (from the past 240 hour(s)).  Radiology Reports Dg Chest 2 View  02/13/2015   CLINICAL DATA:  Hyponatremia  EXAM: CHEST  2 VIEW  COMPARISON:  04/28/2014  FINDINGS: Stable cardiac enlargement. Bilateral chronic pleural thickening, with lateral right pleural plaque and left pleural plaque, stable. Interstitial change at both lung bases suggesting scarring and/or atelectasis similar to prior study.  IMPRESSION: Findings related to prior asbestos exposure, stable.  Stable cardiac enlargement.   Electronically Signed   By: Skipper Cliche M.D.   On: 02/13/2015 10:32   Dg Abd 1 View  02/12/2015   CLINICAL DATA:  69 year old male with difficulty urinating and constipation for 3 weeks. Abdominal pain.  EXAM: ABDOMEN - 1 VIEW  COMPARISON:  11/09/2014 CT  FINDINGS: Nondistended gas-filled loops of small bowel and colon are noted.  There is no evidence of dilated bowel loops.  No suspicious  calcifications are noted.  No acute bony abnormalities are noted. Degenerative changes in the lower lumbar spine are identified.  IMPRESSION: Nonspecific nonobstructive bowel gas pattern.   Electronically Signed   By: Margarette Canada M.D.   On: 02/12/2015 21:02    CBC  Recent Labs Lab 02/12/15 1911 02/12/15 2340 02/13/15 0711  WBC 5.7 7.1 5.1  HGB 12.5* 14.3 12.6*  HCT 36.2* 40.3 36.0*  PLT 239 242 233  MCV 84.8 85.0 85.1  MCH 29.3 30.2 29.8  MCHC 34.5 35.5 35.0  RDW 13.0 13.0  13.1  LYMPHSABS 0.9  --   --   MONOABS 0.8  --   --   EOSABS 0.1  --   --   BASOSABS 0.0  --   --     Chemistries   Recent Labs Lab 02/12/15 1911 02/12/15 2020 02/12/15 2340 02/13/15 0300 02/13/15 0711 02/13/15 1135  NA 121*  --  123* 125* 126* 125*  K 4.3  --  3.4* 3.7 3.8 3.5  CL 86*  --  88* 94* 94* 92*  CO2 24  --  23 23 24 26   GLUCOSE 90  --  104* 99 96 105*  BUN 8  --  7 6 6 7   CREATININE 1.08  --  1.01 0.94 0.92 1.03  CALCIUM 9.3  --  9.1 8.6* 8.8* 8.4*  AST  --  24  --   --   --   --   ALT  --  24  --   --   --   --   ALKPHOS  --  81  --   --   --   --   BILITOT  --  0.6  --   --   --   --    ------------------------------------------------------------------------------------------------------------------ estimated creatinine clearance is 73.2 mL/min (by C-G formula based on Cr of 1.03). ------------------------------------------------------------------------------------------------------------------ No results for input(s): HGBA1C in the last 72 hours. ------------------------------------------------------------------------------------------------------------------ No results for input(s): CHOL, HDL, LDLCALC, TRIG, CHOLHDL, LDLDIRECT in the last 72 hours. ------------------------------------------------------------------------------------------------------------------  Recent Labs  02/12/15 2340  TSH 1.496    ------------------------------------------------------------------------------------------------------------------ No results for input(s): VITAMINB12, FOLATE, FERRITIN, TIBC, IRON, RETICCTPCT in the last 72 hours.  Coagulation profile No results for input(s): INR, PROTIME in the last 168 hours.  No results for input(s): DDIMER in the last 72 hours.  Cardiac Enzymes No results for input(s): CKMB, TROPONINI, MYOGLOBIN in the last 168 hours.  Invalid input(s): CK ------------------------------------------------------------------------------------------------------------------ Invalid input(s): POCBNP    Jairen Goldfarb D.O. on 02/13/2015 at 12:46 PM  Between 7am to 7pm - Pager - [phone]  After 7pm go to [email or web address] - password [email or web address]  And look for the night coverage person covering for me after hours  [facility] Office  [phone]

## 2015-02-13 NOTE — Progress Notes (Signed)
Spoke with tele and MD, metoprolol on hold for now due to patient running sinus brady at times and heart blocks noted.

## 2015-02-13 NOTE — Telephone Encounter (Signed)
Pt currently admitted at [facility].

## 2015-02-14 DIAGNOSIS — I2510 Atherosclerotic heart disease of native coronary artery without angina pectoris: Secondary | ICD-10-CM

## 2015-02-14 DIAGNOSIS — G9050 Complex regional pain syndrome I, unspecified: Secondary | ICD-10-CM

## 2015-02-14 DIAGNOSIS — I10 Essential (primary) hypertension: Secondary | ICD-10-CM

## 2015-02-14 DIAGNOSIS — E871 Hypo-osmolality and hyponatremia: Secondary | ICD-10-CM

## 2015-02-14 DIAGNOSIS — R339 Retention of urine, unspecified: Secondary | ICD-10-CM

## 2015-02-14 DIAGNOSIS — I714 Abdominal aortic aneurysm, without rupture: Secondary | ICD-10-CM

## 2015-02-14 DIAGNOSIS — J449 Chronic obstructive pulmonary disease, unspecified: Secondary | ICD-10-CM

## 2015-02-14 LAB — BASIC METABOLIC PANEL
ANION GAP: 13 (ref 5–15)
BUN: 7 mg/dL (ref 6–20)
CHLORIDE: 94 mmol/L — AB (ref 101–111)
CO2: 23 mmol/L (ref 22–32)
Calcium: 9.4 mg/dL (ref 8.9–10.3)
Creatinine, Ser: 1.14 mg/dL (ref 0.61–1.24)
Glucose, Bld: 108 mg/dL — ABNORMAL HIGH (ref 65–99)
POTASSIUM: 4.3 mmol/L (ref 3.5–5.1)
SODIUM: 130 mmol/L — AB (ref 135–145)

## 2015-02-14 MED ORDER — METOPROLOL TARTRATE 12.5 MG HALF TABLET: 12.5000 mg | ORAL_TABLET | Freq: Two times a day (BID) | ORAL | Status: DC

## 2015-02-14 MED ORDER — GABAPENTIN 300 MG PO CAPS: 600.0000 mg | ORAL_CAPSULE | Freq: Three times a day (TID) | ORAL | Status: DC

## 2015-02-14 NOTE — Care Management Note (Addendum)
Case Management Note  Patient Details  Name: Curtis [name] MRN: [id number] Date of Birth: 1946-02-20  Subjective/Objective:                   Spoke with patient at the bedside.  Introduced self as Tourist information centre manager and explained role in discharge planning and how to be reached.  Verified patient lives alone son lives next door and checks on him frequently.  Verified patient anticipates to go home alone, at time of discharge and will have  part-time supervision by family  at this time to best of their knowledge.  Patient has DME cane walker  wheelchair oxygen through [facility] nebulizer etc. Expressed potential need for no other DME.  Patient denied   needing help with their medication.  Patient  is driven by son  to MD appointments.  Verified patient has PCP. Patient states they currently receive [place] services through no one.  Patient was provided choice and selected [facility] for home health needs if they arise.  Plan: CM will continue to follow for discharge planning and [facility] resources.   Carles Collet RN BSN CM [phone]   Action/Plan:  Referral made to [facility] for [facility] nursing and medication assistance. Referral placed to pharmacy for medication education.  Expected Discharge Date:                  Expected Discharge Plan:  [place]  In-House Referral:     Discharge planning Services  CM Consult  Post Acute Care Choice:  Home Health Choice offered to:  Patient  DME Arranged:    DME Agency:  [place] Arranged:  RN [facility] Agency:  [place]  Status of Service:  Completed, signed off  Medicare Important Message Given:    Date Medicare IM Given:    Medicare IM give by:    Date Additional Medicare IM Given:    Additional Medicare Important Message give by:     If discussed at [place] of Stay Meetings, dates discussed:    Additional Comments:  Carles Collet, RN 02/14/2015, 11:17 AM

## 2015-02-14 NOTE — Discharge Summary (Signed)
Physician Discharge Summary   Patient ID: Curtis [name] MRN: [id number] DOB/AGE: 01/09/1946 69 y.o.  Admit date: 02/12/2015 Discharge date: 02/14/2015  Primary Care Physician:  [name], MD  Discharge Diagnoses:    . Acute hyponatremia . Urinary retention . AAA (abdominal aortic aneurysm), stable . CAD (coronary artery disease), native coronary artery . COPD with chronic bronchitis ([place]) . HTN (hypertension) . Reflex sympathetic dystrophy  Consults:  None   Recommendations for Outpatient Follow-up:  Patient had severe hyponatremia and urinary retention secondary to starting oxcarbazepine for neuropathy, which has been weaned off  TESTS THAT NEED FOLLOW-UP BMET   DIET: Regular diet    Allergies:   Allergies  Allergen Reactions  . Codeine Itching     Discharge Medications:   Medication List    STOP taking these medications        metoprolol 50 MG tablet  Commonly known as:  LOPRESSOR     OXcarbazepine 150 MG tablet  Commonly known as:  TRILEPTAL      TAKE these medications        alprazolam 2 MG tablet  Commonly known as:  XANAX  Take 1 tablet (2 mg total) by mouth 2 (two) times daily.     amitriptyline 75 MG tablet  Commonly known as:  ELAVIL  Take 75 mg by mouth at bedtime.     amLODipine 2.5 MG tablet  Commonly known as:  NORVASC  Take 2.5 mg by mouth every evening.     aspirin EC 81 MG tablet  Take 1 tablet (81 mg total) by mouth daily. Or Enteric Coated (EC) 81 mg     atorvastatin 40 MG tablet  Commonly known as:  LIPITOR  Take 1 tablet (40 mg total) by mouth daily.     bisacodyl 5 MG EC tablet  Commonly known as:  DULCOLAX  Take 5 mg by mouth daily as needed for mild constipation or moderate constipation.     docusate sodium 100 MG capsule  Commonly known as:  COLACE  Take 1 capsule (100 mg total) by mouth 2 (two) times daily.     finasteride 5 MG tablet  Commonly known as:  PROSCAR  Take 5 mg by mouth every  morning.     gabapentin 300 MG capsule  Commonly known as:  NEURONTIN  Take 2 capsules (600 mg total) by mouth 3 (three) times daily.     latanoprost 0.005 % ophthalmic solution  Commonly known as:  XALATAN  Place 1 drop into both eyes at bedtime.     nitroGLYCERIN 0.4 MG SL tablet  Commonly known as:  NITROSTAT  Place 1 tablet (0.4 mg total) under the tongue every 5 (five) minutes x 3 doses as needed for chest pain.     tamsulosin 0.4 MG Caps capsule  Commonly known as:  FLOMAX  Take 0.4 mg by mouth at bedtime.         Brief H and P: For complete details please refer to admission H and P, but in briefHPI on 02/12/2015 by Dr. [name] Curtis [name] is a 69 y.o. male with history of CAD status post stenting, ICD status post ablation, BPH, peripheral neuropathy being followed by Dr. [name] was recently placed on Trileptal for worsening neuropathy presents to the ER because of worsening tingling and numbness and also difficulty urinating and constipation. Patient states over the last couple of days patient was unable to urinate and in the ER bladder scan showed 350 mL urinary  retention following which Foley catheter was placed. Lab work shows severe hyponatremia of 121. Patient states she has not been eating well for last few days. Urine study shows urine sodium of 50 with urine osmolarity 384 which is more consistent with SIADH picture. Patient will be admitted for further observation. On exam patient also hasn't skin rash on his both hands which patient states is new.   Hospital Course:   Severe hyponatremia -Upon admission, sodium 121. Urine sodium 50, osmolality 384, consistent with SIADH. -Patient was recently started on increased level of Trileptal which could be the cause of his hyponatremia -Admitting physician spoke with neurology, Dr. [name], per recommendations, weaned off of Trileptal at discharge. -TSH 1.496, PM cortisol 4.1 -Chest x-ray: Prior asbestos  exposure, stable - Sodium has improved to 130 at the time of discharge. Patient was also recommended to continue fluid restriction up to 1500 cc/24hrs max and follow up with his PCP next week, check BMET.   home health PT, OT, RN was arranged   Urinary retention with history of BPH - Foley catheter was placed. Patient was continued on Flomax and finasteride. He requested the Foley to be removed prior to discharge, voiding trial will be done prior to discharge.  -Continue Flomax and finasteride, follow up outpatient with his urologist,  Dr. [name].  Peripheral neuropathy -Followed by a Dr. [name], neurology, now weaned off. - Continue gabapentin and amitriptyline  Coronary artery disease -Currently denies chest pain -Continue statin and aspirin  COPD -Currently stable, denies any wheezing or shortness of breath  Abdominal aortic aneurysm -Stable  Hypertension Continue Norvasc, metoprolol is temporarily on hold due to bradycardia at the time of admission.  Day of Discharge BP 144/68 mmHg  Pulse 71  Temp(Src) 98.1 F (36.7 C) (Oral)  Resp 20  Ht 6' (1.829 m)  Wt 91.627 kg (202 lb)  BMI 27.39 kg/m2  SpO2 99%  Physical Exam: General: Alert and awake oriented x3 not in any acute distress. HEENT: anicteric sclera, pupils reactive to light and accommodation CVS: S1-S2 clear no murmur rubs or gallops Chest: clear to auscultation bilaterally, no wheezing rales or rhonchi Abdomen: soft nontender, nondistended, normal bowel sounds Extremities: no cyanosis, clubbing or edema noted bilaterally Neuro: Cranial nerves II-XII intact, no focal neurological deficits   The results of significant diagnostics from this hospitalization (including imaging, microbiology, ancillary and laboratory) are listed below for reference.    LAB RESULTS: Basic Metabolic Panel:  Recent Labs Lab 02/13/15 1407 02/14/15 0835  NA 126* 130*  K 3.8 4.3  CL 97* 94*  CO2 23 23  GLUCOSE 103* 108*   BUN 7 7  CREATININE 0.96 1.14  CALCIUM 8.4* 9.4   Liver Function Tests:  Recent Labs Lab 02/12/15 2020  AST 24  ALT 24  ALKPHOS 81  BILITOT 0.6  PROT 7.4  ALBUMIN 3.7   No results for input(s): LIPASE, AMYLASE in the last 168 hours. No results for input(s): AMMONIA in the last 168 hours. CBC:  Recent Labs Lab 02/12/15 1911 02/12/15 2340 02/13/15 0711  WBC 5.7 7.1 5.1  NEUTROABS 3.7  --   --   HGB 12.5* 14.3 12.6*  HCT 36.2* 40.3 36.0*  MCV 84.8 85.0 85.1  PLT 239 242 233   Cardiac Enzymes: No results for input(s): CKTOTAL, CKMB, CKMBINDEX, TROPONINI in the last 168 hours. BNP: Invalid input(s): POCBNP CBG: No results for input(s): GLUCAP in the last 168 hours.  Significant Diagnostic Studies:  Dg Chest 2 View  02/13/2015  CLINICAL DATA:  Hyponatremia EXAM: CHEST  2 VIEW COMPARISON:  04/28/2014 FINDINGS: Stable cardiac enlargement. Bilateral chronic pleural thickening, with lateral right pleural plaque and left pleural plaque, stable. Interstitial change at both lung bases suggesting scarring and/or atelectasis similar to prior study. IMPRESSION: Findings related to prior asbestos exposure, stable. Stable cardiac enlargement. Electronically Signed   By: Skipper Cliche M.D.   On: 02/13/2015 10:32   Dg Abd 1 View  02/12/2015  CLINICAL DATA:  69 year old male with difficulty urinating and constipation for 3 weeks. Abdominal pain. EXAM: ABDOMEN - 1 VIEW COMPARISON:  11/09/2014 CT FINDINGS: Nondistended gas-filled loops of small bowel and colon are noted. There is no evidence of dilated bowel loops. No suspicious calcifications are noted. No acute bony abnormalities are noted. Degenerative changes in the lower lumbar spine are identified. IMPRESSION: Nonspecific nonobstructive bowel gas pattern. Electronically Signed   By: Margarette Canada M.D.   On: 02/12/2015 21:02    2D ECHO:   Disposition and Follow-up: Discharge Instructions    Discharge instructions    Complete by:   As directed   PLEASE DONOT TAKE TRILEPTAL (OXCARBAZEPINE)  Please note that metoprolol is currently on HOLD due to your heart rate being on the lower side. It can be restarted by your primary care doctor on your follow-up appointment.  Please eat heart healthy diet with fluid restriction 1500cc maximum in 24 hours.     Increase activity slowly    Complete by:  As directed             DISPOSITION: home    DISCHARGE FOLLOW-UP Follow-up Information    Follow up with [name], MD. Schedule an appointment as soon as possible for a visit in 10 days.   Specialty:  Family Medicine   Why:  for hospital follow-up, obtain labs for sodium level    Contact information:   [address] [place] [address] [phone]       Follow up with Lenor Coffin, MD. Schedule an appointment as soon as possible for a visit in 10 days.   Specialty:  Neurology   Why:  for hospital follow-up   Contact information:   [address] [place] [address] [phone]       Follow up with Ailene Rud, MD. Schedule an appointment as soon as possible for a visit in 10 days.   Specialty:  Urology   Why:  for hospital follow-up   Contact information:   [place] [address] [phone]       Follow up with [facility].   Why:  RN for home health and medicine education/ compliance. Will call in next 24 to 48 hours to set up first visit.   Contact information:   [address] [place] [address] [phone]        Time spent on Discharge: 35 mins   Signed:   Tanice Petre M.D. [facility] 02/14/2015, 2:32 PM Pager: [phone]

## 2015-02-14 NOTE — Progress Notes (Signed)
Referral placed for [facility] to possibly assist with medication compliance.

## 2015-02-14 NOTE — Evaluation (Signed)
Physical Therapy Evaluation Patient Details Name: Curtis [name] MRN: [id number] DOB: Jan 30, 1946 Today's Date: 02/14/2015   History of Present Illness  Pt adm with hyponatremia and urinary retention. PMH - peripheral neuropathy, PVD, RSD, HTN  Clinical Impression  Pt doing well with mobility and no further PT needed.  Ready for dc from PT standpoint.      Follow Up Recommendations No PT follow up    Equipment Recommendations  None recommended by PT    Recommendations for Other Services       Precautions / Restrictions Precautions Precautions: Fall      Mobility  Bed Mobility Overal bed mobility: Modified Independent                Transfers Overall transfer level: Modified independent                  Ambulation/Gait Ambulation/Gait assistance: Modified independent (Device/Increase time) Ambulation Distance (Feet): 150 Feet Assistive device: Straight cane Gait Pattern/deviations: Step-through pattern;Decreased stride length Gait velocity: decr Gait velocity interpretation: Below normal speed for age/gender General Gait Details: no loss of balance or staggering  Stairs            Wheelchair Mobility    Modified Rankin (Stroke Patients Only)       Balance Overall balance assessment: Needs assistance Sitting-balance support: No upper extremity supported;Feet supported Sitting balance-Leahy Scale: Normal     Standing balance support: No upper extremity supported Standing balance-Leahy Scale: Fair                               Pertinent Vitals/Pain      Home Living Family/patient expects to be discharged to:: Private residence Living Arrangements: Alone Available Help at Discharge: Family;Available PRN/intermittently (son lives nearby) Type of Home: House Home Access: Ramped entrance     Home Layout: One level Home Equipment: Environmental consultant - 2 wheels;Cane - quad;Cane - single point Additional Comments: He has an old  injury to the left lower leg that led to RSD and permanent dysfunction     Prior Function Level of Independence: Independent with assistive device(s)         Comments: uses SPC     Hand Dominance   Dominant Hand: Right    Extremity/Trunk Assessment   Upper Extremity Assessment: RUE deficits/detail;LUE deficits/detail     RUE Sensation: history of peripheral neuropathy     Lower Extremity Assessment: RLE deficits/detail;LLE deficits/detail         Communication   Communication: No difficulties  Cognition Arousal/Alertness: Awake/alert Behavior During Therapy: WFL for tasks assessed/performed Overall Cognitive Status: Within Functional Limits for tasks assessed (Pt rambling about medication changes throughout session)                      General Comments      Exercises        Assessment/Plan    PT Assessment Patent does not need any further PT services  PT Diagnosis Difficulty walking   PT Problem List    PT Treatment Interventions     PT Goals (Current goals can be found in the Care Plan section) Acute Rehab PT Goals PT Goal Formulation: All assessment and education complete, DC therapy    Frequency     Barriers to discharge        Co-evaluation               End of  Session Equipment Utilized During Treatment: Gait belt Activity Tolerance: Patient tolerated treatment well Patient left: in bed;with call bell/phone within reach;with bed alarm set           Time: 0951-1001 PT Time Calculation (min) (ACUTE ONLY): 10 min   Charges:   PT Evaluation $Initial PT Evaluation Tier I: 1 Procedure     PT G Codes:        Jackye Dever Mar 02, 2015, 10:28 AM Suanne Marker PT [phone]

## 2015-02-14 NOTE — Progress Notes (Signed)
12 yom admitted with severe hyponatremia secondary to starting oxcarbazepine for neuropathy, severe per patient. Pharmacy consulted to counsel patient on discharge medications.   Patient was confused on several of his medications. He was concerned his metoprolol/amlodipine were being held and would cause his BP to increase. He states he now understands the medications were being held due and would be restarted as appropriate.  Patient was counseled to stop taking his oxcarbazepine (has new bottle of medication in bag). After extensive discussion, patient states he understands this medication is what caused his severe hyponatremia and understands it "put him in the hospital." He says he will no longer take this medication as MD discussed with him.  Patient has no further questions. He was concerned many of his medications would not be restarted on discharge. He is satisfied his other home medications have been restarted and would like a new discharge medication list.  Thank you!   Elicia Lamp, PharmD Clinical Pharmacist Pager [phone] 02/14/2015 1:58 PM

## 2015-02-14 NOTE — Care Management Important Message (Signed)
Important Message  Patient Details  Name: KARRY CAUSER MRN: [id number] Date of Birth: [birth year]   Medicare Important Message Given:  N/A - LOS <3 / Initial given by admissions    Dawayne Patricia, RN 02/14/2015, 2:45 PM

## 2015-02-16 ENCOUNTER — Telehealth: Payer: Self-pay | Admitting: Neurology

## 2015-02-16 NOTE — Telephone Encounter (Signed)
I called the patient. He was recently in the hospital. He was taken off of Trileptal as it caused his sodium to drop. He needed an appointment with Dr. [name] ASAP. Appointment scheduled for Monday 10/17.

## 2015-02-16 NOTE — Telephone Encounter (Signed)
Patient is calling. He was hospitalized for neuropathy in his feet and hands on 02-12-15. The patient states the medication Trileptal caused the patient's sodium and heart rate level to drop. The patient was discharged on 02-14-15. The patient cancelled his appointment for a NCV/EMG for next week but needs a f/u appointment with Dr. [name]. The patient says he is not doing well at all. Please call to discuss. Thank you.

## 2015-02-19 ENCOUNTER — Encounter: Payer: Self-pay | Admitting: Neurology

## 2015-02-19 ENCOUNTER — Ambulatory Visit (INDEPENDENT_AMBULATORY_CARE_PROVIDER_SITE_OTHER): Payer: Medicare Other | Admitting: Neurology

## 2015-02-19 VITALS — BP 152/75 | HR 97 | Ht 72.0 in | Wt 204.0 lb

## 2015-02-19 DIAGNOSIS — G609 Hereditary and idiopathic neuropathy, unspecified: Secondary | ICD-10-CM | POA: Diagnosis not present

## 2015-02-19 DIAGNOSIS — R269 Unspecified abnormalities of gait and mobility: Secondary | ICD-10-CM

## 2015-02-19 DIAGNOSIS — I2510 Atherosclerotic heart disease of native coronary artery without angina pectoris: Secondary | ICD-10-CM

## 2015-02-19 DIAGNOSIS — Z9861 Coronary angioplasty status: Secondary | ICD-10-CM | POA: Diagnosis not present

## 2015-02-19 MED ORDER — TRAMADOL HCL 50 MG PO TABS: 50.0000 mg | ORAL_TABLET | Freq: Four times a day (QID) | ORAL | Status: DC | PRN

## 2015-02-19 NOTE — Progress Notes (Signed)
Reason for visit: Peripheral neuropathy  Curtis [name] is an 69 y.o. male  History of present illness:  Curtis [name] is a 69 year old right-handed white male with a history of burning sensations in the hands and feet. The patient was seen initially, blood work that was done was unremarkable. The patient was set up for EMG and nerve conduction study that has not yet been done. The patient has had significant problems with pain, the patient has had increased doses of gabapentin taking 600 mg 3 times daily, his amitriptyline has been increased to 100 mg at night, and Trileptal was added eventually. The patient developed hyponatremia on Trileptal, this had to be stopped. The patient required a few days in the hospital because of this. The patient has ongoing irritable bowel syndrome symptoms as well, he recently has had a lot of constipation, and difficulty voiding the bladder. The patient has some gait instability, he uses a cane for ambulation, he denies any falls. The patient returns to the office today. The patient has chronic neck and low back discomfort. He has some pain going down into the shoulder blade areas bilaterally.  Past Medical History  Diagnosis Date  . Arthritis   . SOB (shortness of breath)   . 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 Hernia of unspecified site of abdominal cavity without mention of obstruction or gangrene     hiatal  . GERD (gastroesophageal reflux disease)   . Depression   . Diverticulitis     hospital [facility]  . Diverticulosis   . Reflex sympathetic dystrophy   . 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 Hypertension   . IBS (irritable bowel syndrome)   . Peripheral vascular disease ([facility])     ankle brachial index of 0.79 on the right and 0.61 on the left.   . Myocardial infarction ([place]) 01/2014    NSTEMI  . Hyperlipidemia LDL goal <70 04/20/2014  . AAA (abdominal aortic aneurysm), stable 04/20/2014  . 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 Cancer [facility])      Past Surgical History  Procedure Laterality Date  . Nissen fundoplication    . Right elbow surgery      x 2  . Right knee arthroscopy    . Left shoulder       x 3  . Squamous cell skin cancer      Left Hand  . Cataract extraction    . Supraventricular tachycardia ablation N/A 01/16/2014    Procedure: SUPRAVENTRICULAR TACHYCARDIA ABLATION;  Surgeon: Evans Lance, MD;  Location: [facility] CATH LAB;  Service: Cardiovascular;  Laterality: N/A;  . Left heart catheterization with coronary angiogram N/A 01/17/2014    Procedure: LEFT HEART CATHETERIZATION WITH CORONARY ANGIOGRAM;  Surgeon: Leonie Man, MD;  Location: [facility] CATH LAB;  Service: Cardiovascular;  Laterality: N/A;  . Hernia repair    . Cardiac catheterization    . Coronary angioplasty      Family History  Problem Relation Age of Onset  . Colon cancer Mother   . Colon cancer Maternal Aunt   . Colon cancer Maternal Uncle   . Heart attack Neg Hx   . Stroke Neg Hx     Social history:  reports that he quit smoking about 9 years ago. His smoking use included Cigarettes. He has a 125 pack-year smoking history. He quit smokeless tobacco use about 2 years ago. He reports that he does not drink alcohol or use illicit drugs.    Allergies  Allergen Reactions  . Codeine Itching  . Trileptal [Oxcarbazepine]     Hyponatremia    Medications:  Prior to Admission medications   Medication Sig Start Date End Date Taking? Authorizing Provider  alprazolam [name]) 2 MG tablet Take 1 tablet (2 mg total) by mouth 2 (two) times daily. 05/02/14  Yes Theodis Blaze, MD  amitriptyline (ELAVIL) 100 MG tablet Take 100 mg by mouth at bedtime.   Yes Historical Provider, MD  amLODipine (NORVASC) 2.5 MG tablet Take 2.5 mg by mouth every evening.   Yes Historical Provider, MD  aspirin EC 81 MG tablet Take 1 tablet (81 mg total) by mouth daily. Or Enteric Coated (EC) 81 mg 11/23/14  Yes Rhonda G Barrett, PA-C  atorvastatin (LIPITOR) 40 MG tablet Take 1  tablet (40 mg total) by mouth daily. 10/18/14  Yes Evans Lance, MD  bisacodyl (DULCOLAX) 5 MG EC tablet Take 5 mg by mouth daily as needed for mild constipation or moderate constipation.   Yes Historical Provider, MD  docusate sodium (COLACE) 100 MG capsule Take 1 capsule (100 mg total) by mouth 2 (two) times daily. 02/06/15  Yes Marcial Pacas, MD  finasteride (PROSCAR) 5 MG tablet Take 5 mg by mouth every morning.    Yes Historical Provider, MD  gabapentin (NEURONTIN) 300 MG capsule Take 2 capsules (600 mg total) by mouth 3 (three) times daily. 02/14/15  Yes Ripudeep K Rai, MD  latanoprost (XALATAN) 0.005 % ophthalmic solution Place 1 drop into both eyes at bedtime.   Yes Historical Provider, MD  nitroGLYCERIN (NITROSTAT) 0.4 MG SL tablet Place 1 tablet (0.4 mg total) under the tongue every 5 (five) minutes x 3 doses as needed for chest pain. 04/20/14  Yes [name], NP  tamsulosin (FLOMAX) 0.4 MG CAPS capsule Take 0.4 mg by mouth at bedtime.   Yes Historical Provider, MD    ROS:  Out of a complete 14 system review of symptoms, the patient complains only of the following symptoms, and all other reviewed systems are negative.  Decreased appetite, fatigue Eye itching, eye redness, light sensitivity Wheezing, shortness of breath, chest tightness Cold intolerance, abdominal pain, constipation, diarrhea, nausea, rectal pain Insomnia, frequent waking, daytime sleepiness Difficulty urinating, painful urination, urgency, urine decrease Joint pain, joint swelling, back pain, walking difficulty Headache, numbness, weakness, tremors Depression, anxiety  Blood pressure 152/75, pulse 97, height 6' (1.829 m), weight 204 lb (92.534 kg).  Physical Exam  General: The patient is alert and cooperative at the time of the examination.  Skin: No significant peripheral edema is noted.   Neurologic Exam  Mental status: The patient is alert and oriented x 3 at the time of the examination. The patient  has apparent normal recent and remote memory, with an apparently normal attention span and concentration ability.   Cranial nerves: Facial symmetry is present. Speech is normal, no aphasia or dysarthria is noted. Extraocular movements are full. Visual fields are full.  Motor: The patient has good strength in all 4 extremities.  Sensory examination: Soft touch sensation is symmetric on the face, arms, and legs.  Coordination: The patient has good finger-nose-finger and heel-to-shin bilaterally. The patient does have a stocking pattern pinprick sensory deficit up to the knees bilaterally.  Gait and station: The patient has a slightly wide-based gait, the patient walks with a cane. Tandem gait is unsteady. Romberg is negative. No drift is seen.  Reflexes: Deep tendon reflexes are symmetric, but are depressed.   Assessment/Plan:  1.  Peripheral neuropathy  2. Chronic back and neck discomfort  The patient is having a lot of discomfort in the neck, into the shoulders. The patient reports burning sensations on all 4 extremities. The patient canceled EMG and nerve conduction study, this will be rescheduled. The patient will be set up for MRI evaluation of the cervical spine, and Ultram will be added for discomfort if needed. The patient will remain on gabapentin and amitriptyline, he will follow-up for EMG evaluation.  Jill Alexanders MD 02/19/2015 7:47 PM  [facility] [address] [place], [place] [address]  Phone [phone] Fax [phone]

## 2015-02-19 NOTE — Patient Instructions (Addendum)
We will check MRI of the neck, then set up the EMG and NCV study to look at the nerves in the legs. I will give you a prescription for the ultram for pain.  Peripheral Neuropathy Peripheral neuropathy is a type of nerve damage. It affects nerves that carry signals between the spinal cord and other parts of the body. These are called peripheral nerves. With peripheral neuropathy, one nerve or a group of nerves may be damaged.  CAUSES  Many things can damage peripheral nerves. For some people with peripheral neuropathy, the cause is unknown. Some causes include:  Diabetes. This is the most common cause of peripheral neuropathy.  Injury to a nerve.  Pressure or stress on a nerve that lasts a long time.  Too little vitamin B. Alcoholism can lead to this.  Infections.  Autoimmune diseases, such as multiple sclerosis and systemic lupus erythematosus.  Inherited nerve diseases.  Some medicines, such as cancer drugs.  Toxic substances, such as lead and mercury.  Too little blood flowing to the legs.  Kidney disease.  Thyroid disease. SIGNS AND SYMPTOMS  Different people have different symptoms. The symptoms you have will depend on which of your nerves is damaged. Common symptoms include:  Loss of feeling (numbness) in the feet and hands.  Tingling in the feet and hands.  Pain that burns.  Very sensitive skin.  Weakness.  Not being able to move a part of the body (paralysis).  Muscle twitching.  Clumsiness or poor coordination.  Loss of balance.  Not being able to control your bladder.  Feeling dizzy.  Sexual problems. DIAGNOSIS  Peripheral neuropathy is a symptom, not a disease. Finding the cause of peripheral neuropathy can be hard. To figure that out, your health care provider will take a medical history and do a physical exam. A neurological exam will also be done. This involves checking things affected by your brain, spinal cord, and nerves (nervous system).  For example, your health care provider will check your reflexes, how you move, and what you can feel.  Other types of tests may also be ordered, such as:  Blood tests.  A test of the fluid in your spinal cord.  Imaging tests, such as CT scans or an MRI.  Electromyography (EMG). This test checks the nerves that control muscles.  Nerve conduction velocity tests. These tests check how fast messages pass through your nerves.  Nerve biopsy. A small piece of nerve is removed. It is then checked under a microscope. TREATMENT   Medicine is often used to treat peripheral neuropathy. Medicines may include:  Pain-relieving medicines. Prescription or over-the-counter medicine may be suggested.  Antiseizure medicine. This may be used for pain.  Antidepressants. These also may help ease pain from neuropathy.  Lidocaine. This is a numbing medicine. You might wear a patch or be given a shot.  Mexiletine. This medicine is typically used to help control irregular heart rhythms.  Surgery. Surgery may be needed to relieve pressure on a nerve or to destroy a nerve that is causing pain.  Physical therapy to help movement.  Assistive devices to help movement. HOME CARE INSTRUCTIONS   Only take over-the-counter or prescription medicines as directed by your health care provider. Follow the instructions carefully for any given medicines. Do not take any other medicines without first getting approval from your health care provider.  If you have diabetes, work closely with your health care provider to keep your blood sugar under control.  If you have numbness  in your feet:  Check every day for signs of injury or infection. Watch for redness, warmth, and swelling.  Wear padded socks and comfortable shoes. These help protect your feet.  Do not do things that put pressure on your damaged nerve.  Do not smoke. Smoking keeps blood from getting to damaged nerves.  Avoid or limit alcohol. Too much  alcohol can cause a lack of B vitamins. These vitamins are needed for healthy nerves.  Develop a good support system. Coping with peripheral neuropathy can be stressful. Talk to a mental health specialist or join a support group if you are struggling.  Follow up with your health care provider as directed. SEEK MEDICAL CARE IF:   You have new signs or symptoms of peripheral neuropathy.  You are struggling emotionally from dealing with peripheral neuropathy.  You have a fever. SEEK IMMEDIATE MEDICAL CARE IF:   You have an injury or infection that is not healing.  You feel very dizzy or begin vomiting.  You have chest pain.  You have trouble breathing.   This information is not intended to replace advice given to you by your health care provider. Make sure you discuss any questions you have with your health care provider.   Document Released: 04/11/2002 Document Revised: 01/01/2011 Document Reviewed: 12/27/2012 Elsevier Interactive Patient Education [employer].

## 2015-02-21 ENCOUNTER — Encounter: Payer: Medicare Other | Admitting: Neurology

## 2015-02-26 ENCOUNTER — Telehealth: Payer: Self-pay

## 2015-02-26 ENCOUNTER — Telehealth: Payer: Self-pay | Admitting: Neurology

## 2015-02-26 MED ORDER — FENTANYL 12 MCG/HR TD PT72: 12.5000 ug | MEDICATED_PATCH | TRANSDERMAL | Status: DC

## 2015-02-26 NOTE — Telephone Encounter (Signed)
The patient continues to hurt despite multiple medications. I will call in a fentanyl patch. The patient has been set up for MRI of the cervical spine and the EMG study that he canceled has been rescheduled.

## 2015-02-26 NOTE — Telephone Encounter (Signed)
I called the patient. He has been taking Gabapentin, Amitriptyline, and Tramadol with no relief. He also c/o constipation. I advised I would check with Dr. [name] to see if there is anything else he can have for his pain/burning in his hands and feet.

## 2015-02-26 NOTE — Telephone Encounter (Signed)
Rx ready for pick up. 

## 2015-02-26 NOTE — Telephone Encounter (Signed)
Patient is calling. The patient states his feet and hand feels like they are on fire. Please call to discuss. Thank you.

## 2015-02-27 ENCOUNTER — Encounter: Payer: Self-pay | Admitting: Internal Medicine

## 2015-02-27 NOTE — Telephone Encounter (Signed)
I called the patient. He is still having some problems with burning in the extremities, but overall he is somewhat better. We are planning on doing the MRI of the cervical spine on November 3. I will call him when he gets the results.

## 2015-02-27 NOTE — Telephone Encounter (Signed)
Patient called regarding reaction he is having to fentaNYL (DURAGESIC - DOSED MCG/HR) 12 MCG/HR. Patient states that he woke up about 2 hours ago and it "feels like his whole body is on fire".

## 2015-02-27 NOTE — Telephone Encounter (Signed)
I called the patient. He is burning all over. His symptoms do not sound any different than before. I advised that the Fentanyl patch has not had time to fully work yet as he just started it yesterday. The patient insists he needs something else for pain. I advised I would let Dr. [name] know what is going on.

## 2015-03-07 ENCOUNTER — Encounter: Payer: Medicare Other | Admitting: Neurology

## 2015-03-07 ENCOUNTER — Other Ambulatory Visit: Payer: Self-pay | Admitting: Cardiovascular Disease

## 2015-03-07 MED ORDER — AMLODIPINE BESYLATE 2.5 MG PO TABS: 2.5000 mg | ORAL_TABLET | Freq: Every evening | ORAL | Status: DC

## 2015-03-08 ENCOUNTER — Telehealth: Payer: Self-pay | Admitting: Neurology

## 2015-03-08 ENCOUNTER — Inpatient Hospital Stay: Admission: RE | Admit: 2015-03-08 | Payer: Medicare Other | Source: Ambulatory Visit

## 2015-03-08 NOTE — Telephone Encounter (Signed)
Patient called to advise he rescheduled MRI at [facility] from 11/3 to 03/20/15 due to taking laxative for constipation and has diarrhea.

## 2015-03-09 ENCOUNTER — Telehealth: Payer: Self-pay | Admitting: Neurology

## 2015-03-09 MED ORDER — GABAPENTIN 800 MG PO TABS: 800.0000 mg | ORAL_TABLET | Freq: Three times a day (TID) | ORAL | Status: DC

## 2015-03-09 NOTE — Telephone Encounter (Signed)
I called the patient. His son indicated that he is having more issues with burning of the feet and hands. When I talked with the patient, he mentioned nothing of this, all he could talk about is his bowels and prostate issues. The patient never mentioned anything about his neuropathy discomfort. I will increase the gabapentin to 800 mg three times a day. He needs to have the MRI ordered, and the EMG evaluation. He has canceled/rescheduled both tests.

## 2015-03-20 ENCOUNTER — Inpatient Hospital Stay: Admission: RE | Admit: 2015-03-20 | Payer: Medicare Other | Source: Ambulatory Visit

## 2015-03-26 ENCOUNTER — Encounter: Payer: Medicare Other | Admitting: Neurology

## 2015-04-03 ENCOUNTER — Inpatient Hospital Stay: Admission: RE | Admit: 2015-04-03 | Payer: Medicare Other | Source: Ambulatory Visit

## 2015-04-04 ENCOUNTER — Encounter: Payer: Medicare Other | Admitting: Neurology

## 2015-04-05 ENCOUNTER — Telehealth: Payer: Self-pay | Admitting: Neurology

## 2015-04-08 ENCOUNTER — Inpatient Hospital Stay: Admission: RE | Admit: 2015-04-08 | Payer: Medicare Other | Source: Ambulatory Visit

## 2015-04-12 ENCOUNTER — Encounter: Payer: Self-pay | Admitting: Neurology

## 2015-04-12 ENCOUNTER — Ambulatory Visit (INDEPENDENT_AMBULATORY_CARE_PROVIDER_SITE_OTHER): Payer: Self-pay | Admitting: Neurology

## 2015-04-12 ENCOUNTER — Ambulatory Visit (INDEPENDENT_AMBULATORY_CARE_PROVIDER_SITE_OTHER): Payer: Medicare Other | Admitting: Neurology

## 2015-04-12 DIAGNOSIS — G609 Hereditary and idiopathic neuropathy, unspecified: Secondary | ICD-10-CM | POA: Diagnosis not present

## 2015-04-12 DIAGNOSIS — R269 Unspecified abnormalities of gait and mobility: Secondary | ICD-10-CM

## 2015-04-12 MED ORDER — LAMOTRIGINE 25 MG PO TABS: ORAL_TABLET | ORAL | Status: DC

## 2015-04-12 NOTE — Progress Notes (Signed)
Curtis [name] is a 69 year old gentleman with a history of burning dysesthesias in the legs and hands, a mild gait disorder. The patient has returned to the office for EMG and nerve conduction study evaluation. 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 EMG of the left lower extremity is consistent with the presence of a peripheral neuropathy. No evidence of an overlying lumbosacral radiculopathy was seen.  The patient has been placed on gabapentin, amitriptyline, and he has been given a fentanyl patch. The patch did help his pain somewhat, but the patient does not wish to continue the medication as he does not wish to have a chronic opioid therapy. The patient will be given a prescription for Lamictal, he will follow-up in 4 or 5 months for evaluation.

## 2015-04-12 NOTE — Procedures (Signed)
     HISTORY:  Curtis [name] is a 69 year old gentleman with a history of dysesthesias affecting the hands and legs. The patient is being evaluated for a possible peripheral neuropathy. The patient has had significant discomfort associated with the neuropathy.  NERVE CONDUCTION STUDIES:  Nerve conduction studies were performed on both upper extremities. The distal motor latencies and motor amplitudes for the median and ulnar nerves were within normal limits. The F wave latencies and nerve conduction velocities for these nerves were also normal. The sensory latencies for the median and ulnar nerves were normal.  Nerve conduction studies were performed on both lower extremities. No response was seen for the peroneal nerves on both sides, the distal motor latencies for the posterior tibial nerves were normal bilaterally but with a low motor amplitudes for these nerves bilaterally. The nerve conduction velocities for the posterior tibial nerves were slowed on the right, normal on the left, with absent peroneal sensory latencies and sural sensory latencies bilaterally. The H reflex latencies were unobtainable bilaterally.  EMG STUDIES:  EMG study was performed on the left lower extremity:  The tibialis anterior muscle reveals 2 to 5K motor units with decreased recruitment. No fibrillations or positive waves were seen. The peroneus tertius muscle reveals 2 to 5K motor units with decreased recruitment. No fibrillations or positive waves were seen. The medial gastrocnemius muscle reveals 1 to 3K motor units with slightly decreased recruitment. 1+ fibrillations and positive waves were seen. The vastus lateralis muscle reveals 2 to 4K motor units with full recruitment. No fibrillations or positive waves were seen. The iliopsoas muscle reveals 2 to 4K motor units with full recruitment. No fibrillations or positive waves were seen. The biceps femoris muscle (long head) reveals 2 to 4K motor units with  full recruitment. No fibrillations or positive waves were seen. The lumbosacral paraspinal muscles were tested at 3 levels, and revealed no abnormalities of insertional activity at all 3 levels tested. There was good relaxation.   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 EMG evaluation of the left lower extremity shows chronic stable signs of denervation below the knee consistent with the diagnosis of peripheral neuropathy. No evidence of an overlying lumbosacral radiculopathy was seen.  Jill Alexanders MD 04/12/2015 12:32 PM  [facility] [address] [place], [place] [address]  Phone [phone] Fax [phone]

## 2015-04-12 NOTE — Progress Notes (Signed)
Please refer to EMG and nerve conduction study procedure note. 

## 2015-04-26 ENCOUNTER — Telehealth: Payer: Self-pay | Admitting: Neurology

## 2015-04-26 MED ORDER — GABAPENTIN 800 MG PO TABS: 800.0000 mg | ORAL_TABLET | Freq: Three times a day (TID) | ORAL | Status: DC

## 2015-04-26 NOTE — Telephone Encounter (Signed)
Rx has been sent.  Receipt confirmed by pharmacy.   

## 2015-04-26 NOTE — Telephone Encounter (Signed)
Patient called to request refill of gabapentin (NEURONTIN) 800 MG tablet, would like sent to [facility] for a 90 day supply, states Rx doesn't cost him anything through [facility].

## 2015-04-26 NOTE — Telephone Encounter (Signed)
Patient called back requesting that we take care of his Gabapentin Rx before our office closes for the holiday.

## 2015-05-15 ENCOUNTER — Telehealth: Payer: Self-pay | Admitting: Neurology

## 2015-05-15 NOTE — Telephone Encounter (Signed)
Per note: 1 tablet twice daily for 2 weeks, then take 2 tablets twice daily for 2 weeks, then take 3 tablets twice daily I called back and spoke with patient.  He expressed understanding and appreciation.

## 2015-05-15 NOTE — Telephone Encounter (Signed)
Patient called regarding lamoTRIgine (LAMICTAL) 25 MG tablet, was on 1 tablet twice per day then was increased to 3 tablets twice per day, wants to know if when he refills this medication, should he continue with 3 tablets twice per day. Please call to advise.

## 2015-05-29 ENCOUNTER — Other Ambulatory Visit: Payer: Self-pay | Admitting: *Deleted

## 2015-05-29 MED ORDER — ATORVASTATIN CALCIUM 40 MG PO TABS: 40.0000 mg | ORAL_TABLET | Freq: Every day | ORAL | Status: DC

## 2015-06-09 ENCOUNTER — Ambulatory Visit
Admission: RE | Admit: 2015-06-09 | Discharge: 2015-06-09 | Disposition: A | Discharge status: Home / Self Care | Payer: Medicare Other | Source: Ambulatory Visit | Attending: Neurology | Admitting: Neurology

## 2015-06-09 DIAGNOSIS — R269 Unspecified abnormalities of gait and mobility: Secondary | ICD-10-CM

## 2015-06-09 DIAGNOSIS — G609 Hereditary and idiopathic neuropathy, unspecified: Secondary | ICD-10-CM

## 2015-06-11 ENCOUNTER — Telehealth: Payer: Self-pay | Admitting: Neurology

## 2015-06-11 MED ORDER — LAMOTRIGINE 100 MG PO TABS: 100.0000 mg | ORAL_TABLET | Freq: Two times a day (BID) | ORAL | Status: DC

## 2015-06-11 NOTE — Telephone Encounter (Signed)
I called patient. 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 The patient is having some discomfort in the hands and feet, the patient does have a neuropathy, our radiologist amitriptyline and gabapentin in high-dose is. He is on Lamictal taking 75 mg twice daily, I will convert him to 100 mg twice daily.   MRI cervical 06/11/15:   IMPRESSION: This MRI of the cervical spine without contrast shows the following: 1. Multilevel degenerative changes as detailed above. The changes are most significant at C6-C7 where there is moderate left greater than right foraminal narrowing due to disc protrusion, uncovertebral spurring and facet hypertrophy. At this level, there is no definite nerve root compression though there is some encroachment upon the exiting C6 nerve roots. Degenerative changes at other levels is milder with less potential for nerve root impingement. 2. The spinal cord appears normal.

## 2015-06-14 NOTE — Telephone Encounter (Signed)
ERROR

## 2015-07-15 ENCOUNTER — Other Ambulatory Visit: Payer: Self-pay | Admitting: Neurology

## 2015-07-20 ENCOUNTER — Encounter: Payer: Self-pay | Admitting: Gastroenterology

## 2015-08-11 ENCOUNTER — Other Ambulatory Visit: Payer: Self-pay | Admitting: Cardiovascular Disease

## 2015-09-12 ENCOUNTER — Other Ambulatory Visit: Payer: Self-pay | Admitting: Cardiology

## 2015-09-12 NOTE — Telephone Encounter (Signed)
Rx has been sent to the pharmacy electronically. ° °

## 2015-10-15 ENCOUNTER — Other Ambulatory Visit: Payer: Self-pay | Admitting: Neurology

## 2015-10-15 ENCOUNTER — Other Ambulatory Visit: Payer: Self-pay | Admitting: Cardiovascular Disease

## 2015-10-22 ENCOUNTER — Telehealth: Payer: Self-pay | Admitting: *Deleted

## 2015-10-22 MED ORDER — AMLODIPINE BESYLATE 2.5 MG PO TABS: 2.5000 mg | ORAL_TABLET | Freq: Every day | ORAL | Status: DC

## 2015-10-22 NOTE — Telephone Encounter (Signed)
Patient calling for refill -

## 2015-11-27 ENCOUNTER — Telehealth: Payer: Self-pay | Admitting: Neurology
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
## 2015-11-27 NOTE — Telephone Encounter (Signed)
I called the patient. He is having ongoing pain in the hands and feet. I offered to get him worked in to the office, but he does not want to do this. He has some 300 mg gabapentin capsules. He is already taking 2400 mg of gabapentin the day, okay to add another 600 mg a day to this, probably would not go to much higher. If the patient needs assistance, he is to call, we can get him worked in at any time. We may consider switching him to Lyrica, he had a reaction to Trileptal previously. The Lamictal does not appear to be helpful, this could be tapered off. In the future, the amitriptyline could be switched over to Cymbalta.

## 2015-12-16 ENCOUNTER — Other Ambulatory Visit: Payer: Self-pay | Admitting: Cardiology

## 2016-01-10 ENCOUNTER — Telehealth: Payer: Self-pay | Admitting: Neurology

## 2016-01-10 NOTE — Telephone Encounter (Signed)
Patient called requesting to speak with nurse regarding neuropathy, states feet/hands are burning up, "on fire". Please call [phone].

## 2016-01-11 NOTE — Telephone Encounter (Signed)
Events noted, I will go over his medication regimen on the next revisit to see if we can change anything regarding his therapy.

## 2016-01-11 NOTE — Telephone Encounter (Signed)
Called and spoke to pt. Reports that pain is a little better than it was yesterday. Says that he has been taking 3000 mg of gabapentin per day but it isn't helping. Asked him about trying a different medication, such as Lyrica, as he is on the max dose of gabapentin. Replied that he gets his medicine through [facility] and has 3 wks left of the gabapentin. Does not want to add/change medication until he's closer to running out. Offered to schedule appt to discuss meds. His last appt was in October 2016. Told him that he would need a 1 yr follow-up visit next month for continued med refills. He again declined saying that he would call back in a couple of weeks when he's about out of his gabapentin. Asked him to call at least a week before he needs scripts and he was agreeable to doing this.

## 2016-01-13 ENCOUNTER — Other Ambulatory Visit: Payer: Self-pay | Admitting: Neurology

## 2016-01-23 ENCOUNTER — Other Ambulatory Visit: Payer: Self-pay | Admitting: Cardiovascular Disease

## 2016-01-23 ENCOUNTER — Telehealth: Payer: Self-pay

## 2016-01-23 ENCOUNTER — Other Ambulatory Visit: Payer: Self-pay | Admitting: Cardiology

## 2016-01-23 ENCOUNTER — Other Ambulatory Visit: Payer: Self-pay | Admitting: Neurology

## 2016-01-23 MED ORDER — LAMOTRIGINE 100 MG PO TABS: 100.0000 mg | ORAL_TABLET | Freq: Two times a day (BID) | ORAL | 0 refills | Status: DC

## 2016-01-23 NOTE — Telephone Encounter (Signed)
Sent 1 refill in with note to make appt before further refills.

## 2016-01-29 ENCOUNTER — Telehealth: Payer: Self-pay | Admitting: Neurology

## 2016-01-29 NOTE — Telephone Encounter (Signed)
Pt called back. He was advised msg had been sent to RN but Dr Viona Gilmore has not had an opportunity to address it yet. He understood

## 2016-01-29 NOTE — Telephone Encounter (Signed)
Pt's Lamictal was increased in February to 100 mg BID. However, medication was tapered off in July since it was not helpful to pt. Spoke to pt and he said that mail order pharmacy sent refills on Lamictal but he hasn't been taking it in a few months. He will talk w/ pharmacy about returning unopened medication. Offered to schedule follow-up appt for pt but he again declined. Reminded pt that he will need office visit for future refills. Verbalized understanding and appreciation for call.

## 2016-01-29 NOTE — Telephone Encounter (Signed)
Pt called in after receiving lamoTRIgine (LAMICTAL) 100 MG tablet in the mail. He states he has been off the medication for 3-4 months. Does he need to start back on it. He states it was not doing any good. Please call

## 2016-02-18 ENCOUNTER — Other Ambulatory Visit: Payer: Self-pay | Admitting: *Deleted

## 2016-02-18 MED ORDER — AMLODIPINE BESYLATE 2.5 MG PO TABS: 2.5000 mg | ORAL_TABLET | Freq: Every day | ORAL | 0 refills | Status: DC

## 2016-02-20 ENCOUNTER — Encounter: Payer: Self-pay | Admitting: Physician Assistant

## 2016-02-27 ENCOUNTER — Encounter: Payer: Self-pay | Admitting: Physician Assistant

## 2016-02-27 ENCOUNTER — Ambulatory Visit (INDEPENDENT_AMBULATORY_CARE_PROVIDER_SITE_OTHER): Payer: Medicare Other | Admitting: Physician Assistant

## 2016-02-27 VITALS — BP 140/70 | HR 87 | Ht 72.0 in | Wt 227.0 lb

## 2016-02-27 DIAGNOSIS — I471 Supraventricular tachycardia: Secondary | ICD-10-CM

## 2016-02-27 DIAGNOSIS — I714 Abdominal aortic aneurysm, without rupture, unspecified: Secondary | ICD-10-CM

## 2016-02-27 DIAGNOSIS — I2510 Atherosclerotic heart disease of native coronary artery without angina pectoris: Secondary | ICD-10-CM

## 2016-02-27 DIAGNOSIS — I10 Essential (primary) hypertension: Secondary | ICD-10-CM

## 2016-02-27 DIAGNOSIS — E785 Hyperlipidemia, unspecified: Secondary | ICD-10-CM

## 2016-02-27 DIAGNOSIS — I739 Peripheral vascular disease, unspecified: Secondary | ICD-10-CM

## 2016-02-27 DIAGNOSIS — I779 Disorder of arteries and arterioles, unspecified: Secondary | ICD-10-CM

## 2016-02-27 MED ORDER — AMLODIPINE BESYLATE 2.5 MG PO TABS: 2.5000 mg | ORAL_TABLET | Freq: Every day | ORAL | 0 refills | Status: DC

## 2016-02-27 NOTE — Progress Notes (Signed)
Cardiology Office Note:    Date:  02/27/2016   ID:  Curtis [name], DOB 11/07/1945, MRN [id number]  PCP:  [name], MD  Cardiologist:  Dr. Loralie Champagne (has not seen since admit in 9/15) >> requests Dr. Glenetta Hew  Electrophysiologist:  Dr. Cristopher Peru   Referring MD: [name], MD   Chief Complaint  Patient presents with  . Follow-up    CAD    History of Present Illness:    Curtis [name] is a 70 y.o. male with a hx of CAD, carotid artery disease, PAD, COPD, asbestosis, depression LLE reflex sympathetic dystrophy, HTN, prior cerebral aneurysm. 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  He required DCCV and subsequently underwent RFCA with Dr. Lovena Le.  LHC demonstrated an occluded RCA and high-grade disease in the LCx.  He underwent PCI with DES x 2 to LCx.  Last seen here by Dr. Cristopher Peru in 11/15. He was seen in the hospital in 12/15 with chest pain. Myoview was low risk no ischemia.   He is here today with his son. He returns for routine follow-up. Overall, he has been doing well. He denies anginal chest discomfort. He does note occasional dyspepsia. He denies significant changes in his dyspnea. He has chronic discomfort in his hands and feet from neuropathy. This limits his activity. He sleeps on an incline chronically. He started doing this because of acid reflux. He denies PND. He denies significant edema. He denies syncope.  Prior CV studies that were reviewed today include:    Myoview 12/15 IMPRESSION: 1. No scintigraphic evidence of prior infarction or pharmacologically induced ischemia. 2. Normal left ventricular wall motion. 3. Left ventricular ejection fraction 67% 4. Low-risk stress test findings*.  LHC (9/15):   LAD 30-40%, ostial D2 60%, proximal OM 80% and 90% (tandem), RCA occluded   PCI: DES x 2 to the circumflex  Echo (9/15):   Mild LVH, EF 55-60%, normal wall motion, grade 1 diastolic dysfunction, mild AI, moderate MR,  mild LAE  ABIs (9/15):   Right 0.8; left 0.6  Carotid US (5/15):   R ICA 123456; LICA 123456  Past Medical History:  Diagnosis Date  . AAA (abdominal aortic aneurysm), stable 04/20/2014  . Arthritis   . 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 Cancer ([place])   . Depression   . Diverticulitis    hospital 2011 [facility]  . Diverticulosis   . GERD (gastroesophageal reflux disease)   . Hernia of unspecified site of abdominal cavity without mention of obstruction or gangrene    hiatal  . Hyperlipidemia LDL goal <70 04/20/2014  . Hypertension   . IBS (irritable bowel syndrome)   . Myocardial infarction 01/2014   NSTEMI  . Peripheral vascular disease (HCC)    ankle brachial index of 0.79 on the right and 0.61 on the left.   . 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 Reflex sympathetic dystrophy   . 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 SOB (shortness of breath)     Past Surgical History:  Procedure Laterality Date  . CARDIAC CATHETERIZATION    . CATARACT EXTRACTION    . CORONARY ANGIOPLASTY    . HERNIA REPAIR    . LEFT HEART CATHETERIZATION WITH CORONARY ANGIOGRAM N/A 01/17/2014   Procedure: LEFT HEART CATHETERIZATION WITH CORONARY ANGIOGRAM;  Surgeon: Leonie Man, MD;  Location: [facility] CATH LAB;  Service: Cardiovascular;  Laterality: N/A;  . left shoulder      x  3  . NISSEN FUNDOPLICATION    . right elbow surgery     x 2  . right knee arthroscopy    . squamous cell skin cancer     Left Hand  . SUPRAVENTRICULAR TACHYCARDIA ABLATION N/A 01/16/2014   Procedure: SUPRAVENTRICULAR TACHYCARDIA ABLATION;  Surgeon: Evans Lance, MD;  Location: [facility] CATH LAB;  Service: Cardiovascular;  Laterality: N/A;    Current Medications: Current Meds  Medication Sig  . alprazolam (XANAX) 2 MG tablet Take 1 tablet (2 mg total) by mouth 2 (two) times daily.  [name] amitriptyline (ELAVIL) 100 MG tablet Take 100 mg by mouth at bedtime.  [name] amLODipine (NORVASC) 2.5 MG tablet Take 1 tablet (2.5 mg total) by mouth daily.  Please call and schedule an appt for further refills. [id number] - 2nd attempt  . aspirin EC 81 MG tablet Take 1 tablet (81 mg total) by mouth daily. Or Enteric Coated (EC) 81 mg  . atorvastatin (LIPITOR) 40 MG tablet Take 1 tablet (40 mg total) by mouth daily at 6 PM. Please call and schedule an appt for further refills. [id number] - 1st attempt  . bisacodyl (DULCOLAX) 5 MG EC tablet Take 5 mg by mouth daily as needed for mild constipation or moderate constipation.  . docusate sodium (COLACE) 100 MG capsule Take 1 capsule (100 mg total) by mouth 2 (two) times daily.  . finasteride (PROSCAR) 5 MG tablet Take 5 mg by mouth every morning.   . gabapentin (NEURONTIN) 800 MG tablet Take 1 tablet by mouth 3  times daily  . latanoprost (XALATAN) 0.005 % ophthalmic solution Place 1 drop into both eyes at bedtime.  . nitroGLYCERIN (NITROSTAT) 0.4 MG SL tablet Place 1 tablet (0.4 mg total) under the tongue every 5 (five) minutes x 3 doses as needed for chest pain.  . tamsulosin (FLOMAX) 0.4 MG CAPS capsule Take 0.4 mg by mouth at bedtime.  . [DISCONTINUED] amLODipine (NORVASC) 2.5 MG tablet Take 1 tablet (2.5 mg total) by mouth daily. Please call and schedule an appt for further refills. [phone] - 2nd attempt     Allergies:   Codeine and Trileptal [oxcarbazepine]   Social History   Social History  . Marital status: Widowed    Spouse name: N/A  . Number of children: 2  . Years of education: 12   Occupational History  . Veteran    Social History Main Topics  . Smoking status: Former Smoker    Packs/day: 2.50    Years: 50.00    Types: Cigarettes    Quit date: 05/05/2005  . Smokeless tobacco: Former Systems developer    Quit date: 05/03/2012  . Alcohol use No     Comment: last use 2013  . Drug use: No  . Sexual activity: Not Asked     Comment: widowed, Veteran   Other Topics Concern  . None   Social History Narrative   Patient drinks 3 cups of caffeine daily.   Patient is right handed.          Family History:  The patient's family history includes Colon cancer in his maternal aunt, maternal uncle, and mother.   ROS:   Please see the history of present illness.    Review of Systems  Constitution: Positive for malaise/fatigue and weight gain.  Cardiovascular: Positive for chest pain and dyspnea on exertion.  Respiratory: Positive for cough and wheezing.   Musculoskeletal: Positive for joint pain.  Gastrointestinal: Positive for abdominal pain, constipation, diarrhea and nausea.  Genitourinary:  Positive for incomplete emptying.  Psychiatric/Behavioral: Positive for depression. The patient is nervous/anxious.    All other systems reviewed and are negative.   EKGs/Labs/Other Test Reviewed:    EKG:  EKG is  ordered today.  The ekg ordered today demonstratesNSR, HR 87, normal axis, nonspecific ST-T wave changes, QTc 428 ms   Recent Labs: No results found for requested labs within last 8760 hours.   Recent Lipid Panel    Component Value Date/Time   CHOL 102 04/18/2014 0306   TRIG 222 (H) 04/18/2014 0306   HDL 24 (L) 04/18/2014 0306   CHOLHDL 4.3 04/18/2014 0306   VLDL 44 (H) 04/18/2014 0306   LDLCALC 34 04/18/2014 0306   LDLDIRECT 43.7 04/03/2014 1020     Physical Exam:    VS:  BP 140/70   Pulse 87   Ht 6' (1.829 m)   Wt 227 lb (103 kg)   BMI 30.79 kg/m     Wt Readings from Last 3 Encounters:  02/27/16 227 lb (103 kg)  02/19/15 204 lb (92.5 kg)  02/14/15 202 lb (91.6 kg)     Physical Exam  Constitutional: He is oriented to person, place, and time. He appears well-developed and well-nourished. No distress.  HENT:  Head: Normocephalic and atraumatic.  Eyes: No scleral icterus.  Neck: Neck supple. No JVD present.  Cardiovascular: Normal rate and regular rhythm.   No murmur heard. Pulmonary/Chest: Effort normal. He has no wheezes. He has no rales.  Abdominal: Soft. There is no tenderness.  Musculoskeletal: He exhibits no edema.  Neurological: He is  oriented to person, place, and time.  Skin: Skin is warm and dry.  Psychiatric: He has a normal mood and affect.    ASSESSMENT:    1. Coronary artery disease involving native coronary artery of native heart without angina pectoris   2. Essential hypertension   3. Hyperlipidemia LDL goal <70   4. Bilateral carotid artery disease ([place])   5. Peripheral vascular disease ([place])   6. SVT (supraventricular tachycardia) s/p ablation 01/16/2014   7. Abdominal aortic aneurysm (AAA) without rupture (HCC)    PLAN:    In order of problems listed above:  1. CAD - Hx of NSTEMI in 2015 tx with DES x 2 to LCx.  RCA is known to be occluded.  He denies any recurrent anginal symptoms. Continue amlodipine, aspirin, statin.   2. HTN -Blood pressure is borderline today. He notes good control at home. Continue current therapy. Continue to monitor.   3. HL - Continue Lipitor 40. He tells me that his primary care physician typically manages this. I will request recent lipid panel.   4. Carotid artery disease -  Overdue for Carotid US. Arrange carotid US. Continue aspirin, statin.   5. PAD - He had abnormal ABIs in the hospital at the time of his MI.  He was supposed to see [facility]. He tells me that he did follow-up. No further intervention was planned. He denies significant claudication symptoms.  6. SVT - s/p RFCA for AVNRT in 2015. No apparent recurrence.    7. AAA - 3.2 cm infrarenal AAA by CT done in 7/16.  Arrange FU abd duplex.   Medication Adjustments/Labs and Tests Ordered: Current medicines are reviewed at length with the patient today.  Concerns regarding medicines are outlined above.  Medication changes, Labs and Tests ordered today are outlined in the Patient Instructions noted below. Patient Instructions  Medication Instructions:  A REFILL WAS SENT TO [facility] FOR  AMLODIPINE # 90 X 3   Labwork: WE WILL GET THE RECENT LAB WORK FROM DR. Redmond Pulling OFFICE  Testing/Procedures: 1. Your physician has  requested that you have a carotid duplex. This test is an ultrasound of the carotid arteries in your neck. It looks at blood flow through these arteries that supply the brain with blood. Allow one hour for this exam. There are no restrictions or special instructions.  2. Your physician has requested that you have an abdominal aorta duplex. During this test, an ultrasound is used to evaluate the aorta. Allow 30 minutes for this exam. Do not eat after midnight the day before and avoid carbonated beverages  Follow-Up: DR. HARDING IN 6 MONTHS; Your physician wants you to follow-up in: [place] will receive a reminder letter in the mail two months in advance. If you don't receive a letter, please call our office to schedule the follow-up appointment.  Any Other Special Instructions Will Be Listed Below (If Applicable).  If you need a refill on your cardiac medications before your next appointment, please call your pharmacy.  Signed, Richardson Dopp, PA-C  02/27/2016 1:37 PM    [facility] [place], [place], [place]  [address] Phone: [phone]; Fax: [phone]

## 2016-02-27 NOTE — Patient Instructions (Addendum)
Medication Instructions:  A REFILL WAS SENT TO [facility] FOR AMLODIPINE # 90 X 3   Labwork: WE WILL GET THE RECENT LAB WORK FROM DR. Redmond Pulling OFFICE  Testing/Procedures: 1. Your physician has requested that you have a carotid duplex. This test is an ultrasound of the carotid arteries in your neck. It looks at blood flow through these arteries that supply the brain with blood. Allow one hour for this exam. There are no restrictions or special instructions.  2. Your physician has requested that you have an abdominal aorta duplex. During this test, an ultrasound is used to evaluate the aorta. Allow 30 minutes for this exam. Do not eat after midnight the day before and avoid carbonated beverages  Follow-Up: DR. HARDING IN 6 MONTHS; Your physician wants you to follow-up in: [place] will receive a reminder letter in the mail two months in advance. If you don't receive a letter, please call our office to schedule the follow-up appointment.  Any Other Special Instructions Will Be Listed Below (If Applicable).  If you need a refill on your cardiac medications before your next appointment, please call your pharmacy.

## 2016-03-11 ENCOUNTER — Other Ambulatory Visit: Payer: Self-pay | Admitting: *Deleted

## 2016-03-11 DIAGNOSIS — I10 Essential (primary) hypertension: Secondary | ICD-10-CM

## 2016-03-12 MED ORDER — AMLODIPINE BESYLATE 2.5 MG PO TABS: 2.5000 mg | ORAL_TABLET | Freq: Every day | ORAL | 2 refills | Status: DC

## 2016-03-12 NOTE — Telephone Encounter (Signed)
Rx has been sent to the pharmacy electronically. ° °

## 2016-03-17 ENCOUNTER — Other Ambulatory Visit: Payer: Self-pay | Admitting: Cardiology

## 2016-03-17 ENCOUNTER — Other Ambulatory Visit: Payer: Self-pay | Admitting: Neurology

## 2016-03-18 ENCOUNTER — Telehealth: Payer: Self-pay | Admitting: Cardiology

## 2016-03-18 MED ORDER — ATORVASTATIN CALCIUM 40 MG PO TABS: 40.0000 mg | ORAL_TABLET | Freq: Every day | ORAL | 2 refills | Status: DC

## 2016-03-18 NOTE — Telephone Encounter (Signed)
New message  Needs approval for [facility] To refill medication  30 days [facility] atorvastatin 40mg  1 tab/1xday

## 2016-03-18 NOTE — Telephone Encounter (Signed)
Rx has been sent to the pharmacy electronically. ° °

## 2016-03-21 ENCOUNTER — Encounter (HOSPITAL_COMMUNITY): Payer: Medicare Other

## 2016-03-21 ENCOUNTER — Other Ambulatory Visit (HOSPITAL_COMMUNITY): Payer: Medicare Other

## 2016-04-07 ENCOUNTER — Other Ambulatory Visit: Payer: Self-pay | Admitting: Neurology

## 2016-04-07 ENCOUNTER — Telehealth: Payer: Self-pay | Admitting: Neurology

## 2016-04-07 NOTE — Telephone Encounter (Signed)
Patient is calling.back saying to disregard previous message about possibly changing to Lipitor. He says that Lipitor is too expensive.

## 2016-04-07 NOTE — Telephone Encounter (Signed)
Patient is calling to discuss possibly taking Lipitor instead of gabapentin (NEURONTIN) 800 MG tablet.

## 2016-04-09 ENCOUNTER — Telehealth: Payer: Self-pay | Admitting: Neurology

## 2016-04-09 MED ORDER — GABAPENTIN 800 MG PO TABS: 800.0000 mg | ORAL_TABLET | Freq: Three times a day (TID) | ORAL | 0 refills | Status: DC

## 2016-04-09 NOTE — Telephone Encounter (Signed)
Patient called and wanted to speak with someone regarding his gabapentin medication. He will run out the end of this month and he doesn't have another apt with [place] until 05/27/15. He wants to know what he should do. Please call and advise.

## 2016-04-09 NOTE — Telephone Encounter (Signed)
E-scribed refill of requested medication. Spoke to pt and he agreed to keep appt scheduled in Jan.

## 2016-04-22 ENCOUNTER — Other Ambulatory Visit (HOSPITAL_COMMUNITY): Payer: Medicare Other

## 2016-04-22 ENCOUNTER — Encounter (HOSPITAL_COMMUNITY): Payer: Medicare Other

## 2016-05-26 ENCOUNTER — Ambulatory Visit: Payer: Medicare Other | Admitting: Neurology

## 2016-06-05 ENCOUNTER — Ambulatory Visit (INDEPENDENT_AMBULATORY_CARE_PROVIDER_SITE_OTHER): Payer: Medicare Other | Admitting: Neurology

## 2016-06-05 ENCOUNTER — Encounter: Payer: Self-pay | Admitting: Neurology

## 2016-06-05 VITALS — BP 149/81 | HR 98 | Resp 20 | Ht 72.0 in | Wt 228.0 lb

## 2016-06-05 DIAGNOSIS — G609 Hereditary and idiopathic neuropathy, unspecified: Secondary | ICD-10-CM

## 2016-06-05 MED ORDER — GABAPENTIN 800 MG PO TABS: 800.0000 mg | ORAL_TABLET | Freq: Three times a day (TID) | ORAL | 3 refills | Status: DC

## 2016-06-05 NOTE — Progress Notes (Signed)
Reason for visit: Peripheral neuropathy  Curtis [name] is an 71 y.o. male  History of present illness:  Curtis [name] is a 71 year old right handed white male with a history of a peripheral neuropathy with severe discomfort. He currently is on gabapentin taking 800 mg 3 times daily and 100 mg of amitriptyline at night. In the past he has been on Lamictal without benefit, and he has taken Trileptal which resulted in hyponatremia. The patient indicates that over the last month his discomfort has actually improved some for unknown reason. He will have good days and bad days with the pain. The patient is having more good days. He patient indicates that cold exposure will worsen the burning sensations in the feet and hands. He indicates that he rarely ventures outside of the house more than 2 or 3 times a month. He denies any significant problems with balance, he occasionally will use a cane for ambulation. He has not had any recent falls. He returns to this office for an evaluation.  Past Medical History:  Diagnosis Date  . AAA (abdominal aortic aneurysm), stable 04/20/2014  . Arthritis   . 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 Cancer ([place])   . Depression   . Diverticulitis    hospital 2011 [facility]  . Diverticulosis   . GERD (gastroesophageal reflux disease)   . Hernia of unspecified site of abdominal cavity without mention of obstruction or gangrene    hiatal  . Hyperlipidemia LDL goal <70 04/20/2014  . Hypertension   . IBS (irritable bowel syndrome)   . Myocardial infarction 01/2014   NSTEMI  . Peripheral vascular disease (HCC)    ankle brachial index of 0.79 on the right and 0.61 on the left.   . 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[place])   . Reflex sympathetic dystrophy   . 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 SOB (shortness of breath)     Past Surgical History:  Procedure Laterality Date  . CARDIAC CATHETERIZATION    . CATARACT EXTRACTION    . CORONARY ANGIOPLASTY    . HERNIA REPAIR    .  LEFT HEART CATHETERIZATION WITH CORONARY ANGIOGRAM N/A 01/17/2014   Procedure: LEFT HEART CATHETERIZATION WITH CORONARY ANGIOGRAM;  Surgeon: Leonie Man, MD;  Location: [facility] CATH LAB;  Service: Cardiovascular;  Laterality: N/A;  . left shoulder      x 3  . NISSEN FUNDOPLICATION    . right elbow surgery     x 2  . right knee arthroscopy    . squamous cell skin cancer     Left Hand  . SUPRAVENTRICULAR TACHYCARDIA ABLATION N/A 01/16/2014   Procedure: SUPRAVENTRICULAR TACHYCARDIA ABLATION;  Surgeon: Evans Lance, MD;  Location: [facility] CATH LAB;  Service: Cardiovascular;  Laterality: N/A;    Family History  Problem Relation Age of Onset  . Colon cancer Mother   . Colon cancer Maternal Aunt   . Colon cancer Maternal Uncle   . Heart attack Neg Hx   . Stroke Neg Hx     Social history:  reports that he quit smoking about 11 years ago. His smoking use included Cigarettes. He has a 125.00 pack-year smoking history. He quit smokeless tobacco use about 4 years ago. He reports that he does not drink alcohol or use drugs.    Allergies  Allergen Reactions  . Codeine Itching  . Trileptal [Oxcarbazepine]     Other reaction(s): Other (See Comments) Abnormal sodium levels Hyponatremia    Medications:  Prior to Admission medications   Medication Sig Start Date End Date Taking? Authorizing Provider  alprazolam [name]) 2 MG tablet Take 1 tablet (2 mg total) by mouth 2 (two) times daily. 05/02/14  Yes Theodis Blaze, MD  amitriptyline (ELAVIL) 100 MG tablet Take 100 mg by mouth at bedtime.   Yes Historical Provider, MD  amLODipine (NORVASC) 2.5 MG tablet Take 1 tablet (2.5 mg total) by mouth daily. 03/12/16  Yes Leonie Man, MD  aspirin EC 81 MG tablet Take 1 tablet (81 mg total) by mouth daily. Or Enteric Coated (EC) 81 mg 11/23/14  Yes Rhonda G Barrett, PA-C  atorvastatin (LIPITOR) 40 MG tablet Take 1 tablet (40 mg total) by mouth daily at 6 PM. 03/18/16  Yes Minus Breeding, MD  bisacodyl  (DULCOLAX) 5 MG EC tablet Take 5 mg by mouth daily as needed for mild constipation or moderate constipation.   Yes Historical Provider, MD  docusate sodium (COLACE) 100 MG capsule Take 1 capsule (100 mg total) by mouth 2 (two) times daily. 02/06/15  Yes Marcial Pacas, MD  finasteride (PROSCAR) 5 MG tablet Take 5 mg by mouth every morning.    Yes Historical Provider, MD  gabapentin (NEURONTIN) 800 MG tablet Take 1 tablet (800 mg total) by mouth 3 (three) times daily. 06/05/16  Yes Kathrynn Ducking, MD  latanoprost (XALATAN) 0.005 % ophthalmic solution Place 1 drop into both eyes at bedtime.   Yes Historical Provider, MD  nitroGLYCERIN (NITROSTAT) 0.4 MG SL tablet Place 1 tablet (0.4 mg total) under the tongue every 5 (five) minutes x 3 doses as needed for chest pain. 04/20/14  Yes [name], NP  tamsulosin (FLOMAX) 0.4 MG CAPS capsule Take 0.4 mg by mouth at bedtime.   Yes Historical Provider, MD    ROS:  Out of a complete 14 system review of symptoms, the patient complains only of the following symptoms, and all other reviewed systems are negative.  Fatigue Runny nose, drooling Eye discharge Wheezing, shortness of breath Cold intolerance, excessive thirst Abdominal pain, constipation, nausea Daytime sleepiness Difficulty urinating Joint pain, joint swelling, back pain, achy muscles, walking difficulty, neck pain, neck stiffness Numbness, weakness Depression, anxiety  Blood pressure (!) 149/81, pulse 98, resp. rate 20, height 6' (1.829 m), weight 228 lb (103.4 kg).  Physical Exam  General: The patient is alert and cooperative at the time of the examination. The patient is moderately to markedly obese.  Skin: No significant peripheral edema is noted.   Neurologic Exam  Mental status: The patient is alert and oriented x 3 at the time of the examination. The patient has apparent normal recent and remote memory, with an apparently normal attention span and concentration  ability.   Cranial nerves: Facial symmetry is present. Speech is normal, no aphasia or dysarthria is noted. Extraocular movements are full. Visual fields are full.  Motor: The patient has good strength in all 4 extremities.  Sensory examination: Soft touch sensation is symmetric on the face, arms, and legs. There is a stocking pattern pinprick sensory deficit two thirds the way up the legs bilaterally.  Coordination: The patient has good finger-nose-finger and heel-to-shin bilaterally.  Gait and station: The patient has a normal gait. Tandem gait is slightly unsteady. Romberg is negative. No drift is seen.  Reflexes: Deep tendon reflexes are symmetric, but are depressed.   Assessment/Plan:  1. Peripheral neuropathy  The patient is having ongoing discomfort, but he does not wish to try another medication such  as Keppra or Topamax for the neuropathy. The patient was given a prescription for the gabapentin, he will follow-up in one year.  Jill Alexanders MD 06/05/2016 7:54 AM  [facility] [address] [place], [place] [address]  Phone [phone] Fax [phone]

## 2016-09-21 ENCOUNTER — Other Ambulatory Visit: Payer: Self-pay | Admitting: Cardiology

## 2016-09-22 NOTE — Telephone Encounter (Signed)
Rx request sent to pharmacy.  

## 2016-09-25 ENCOUNTER — Other Ambulatory Visit: Payer: Self-pay

## 2016-09-25 DIAGNOSIS — I10 Essential (primary) hypertension: Secondary | ICD-10-CM

## 2016-09-25 MED ORDER — AMLODIPINE BESYLATE 2.5 MG PO TABS: 2.5000 mg | ORAL_TABLET | Freq: Every day | ORAL | 1 refills | Status: DC

## 2016-09-25 NOTE — Telephone Encounter (Signed)
Rx(s) sent to pharmacy electronically.  

## 2017-01-25 ENCOUNTER — Other Ambulatory Visit: Payer: Self-pay | Admitting: Cardiovascular Disease

## 2017-01-25 DIAGNOSIS — I10 Essential (primary) hypertension: Secondary | ICD-10-CM

## 2017-02-04 ENCOUNTER — Other Ambulatory Visit: Payer: Self-pay | Admitting: Neurology

## 2017-04-09 ENCOUNTER — Other Ambulatory Visit: Payer: Self-pay | Admitting: Neurology

## 2017-04-09 ENCOUNTER — Other Ambulatory Visit: Payer: Self-pay | Admitting: Cardiovascular Disease

## 2017-04-09 DIAGNOSIS — I10 Essential (primary) hypertension: Secondary | ICD-10-CM

## 2017-06-08 ENCOUNTER — Ambulatory Visit: Payer: Medicare Other | Admitting: Adult Health

## 2017-06-25 ENCOUNTER — Other Ambulatory Visit: Payer: Self-pay | Admitting: Neurology

## 2017-08-20 ENCOUNTER — Emergency Department (HOSPITAL_COMMUNITY)
Admission: EM | Admit: 2017-08-20 | Discharge: 2017-08-20 | Disposition: A | Discharge status: Home / Self Care | Payer: Medicare Other | Attending: Emergency Medicine | Admitting: Emergency Medicine

## 2017-08-20 ENCOUNTER — Other Ambulatory Visit: Payer: Self-pay

## 2017-08-20 ENCOUNTER — Encounter (HOSPITAL_COMMUNITY): Payer: Self-pay | Admitting: Emergency Medicine

## 2017-08-20 ENCOUNTER — Emergency Department (HOSPITAL_COMMUNITY): Payer: Medicare Other

## 2017-08-20 DIAGNOSIS — Y999 Unspecified external cause status: Secondary | ICD-10-CM | POA: Insufficient documentation

## 2017-08-20 DIAGNOSIS — Z79899 Other long term (current) drug therapy: Secondary | ICD-10-CM | POA: Diagnosis not present

## 2017-08-20 DIAGNOSIS — S299XXA Unspecified injury of thorax, initial encounter: Secondary | ICD-10-CM | POA: Diagnosis present

## 2017-08-20 DIAGNOSIS — Z7982 Long term (current) use of aspirin: Secondary | ICD-10-CM | POA: Insufficient documentation

## 2017-08-20 DIAGNOSIS — S20211A Contusion of right front wall of thorax, initial encounter: Secondary | ICD-10-CM | POA: Insufficient documentation

## 2017-08-20 DIAGNOSIS — W19XXXA Unspecified fall, initial encounter: Secondary | ICD-10-CM | POA: Insufficient documentation

## 2017-08-20 DIAGNOSIS — Y939 Activity, unspecified: Secondary | ICD-10-CM | POA: Diagnosis not present

## 2017-08-20 DIAGNOSIS — Z85828 Personal history of other malignant neoplasm of skin: Secondary | ICD-10-CM | POA: Insufficient documentation

## 2017-08-20 DIAGNOSIS — Z955 Presence of coronary angioplasty implant and graft: Secondary | ICD-10-CM | POA: Insufficient documentation

## 2017-08-20 DIAGNOSIS — I2510 Atherosclerotic heart disease of native coronary artery without angina pectoris: Secondary | ICD-10-CM | POA: Diagnosis not present

## 2017-08-20 DIAGNOSIS — I10 Essential (primary) hypertension: Secondary | ICD-10-CM | POA: Diagnosis not present

## 2017-08-20 DIAGNOSIS — Z87891 Personal history of nicotine dependence: Secondary | ICD-10-CM | POA: Diagnosis not present

## 2017-08-20 DIAGNOSIS — Y929 Unspecified place or not applicable: Secondary | ICD-10-CM | POA: Diagnosis not present

## 2017-08-20 DIAGNOSIS — J449 Chronic obstructive pulmonary disease, unspecified: Secondary | ICD-10-CM | POA: Diagnosis not present

## 2017-08-20 DIAGNOSIS — R55 Syncope and collapse: Secondary | ICD-10-CM | POA: Insufficient documentation

## 2017-08-20 LAB — CBC WITH DIFFERENTIAL/PLATELET
BASOS PCT: 1 %
Basophils Absolute: 0.1 10*3/uL (ref 0.0–0.1)
EOS ABS: 0.2 10*3/uL (ref 0.0–0.7)
EOS PCT: 4 %
HCT: 46.4 % (ref 39.0–52.0)
Hemoglobin: 15.1 g/dL (ref 13.0–17.0)
LYMPHS ABS: 1.1 10*3/uL (ref 0.7–4.0)
Lymphocytes Relative: 18 %
MCH: 30.3 pg (ref 26.0–34.0)
MCHC: 32.5 g/dL (ref 30.0–36.0)
MCV: 93 fL (ref 78.0–100.0)
MONO ABS: 0.6 10*3/uL (ref 0.1–1.0)
MONOS PCT: 9 %
Neutro Abs: 4 10*3/uL (ref 1.7–7.7)
Neutrophils Relative %: 68 %
Platelets: 279 10*3/uL (ref 150–400)
RBC: 4.99 MIL/uL (ref 4.22–5.81)
RDW: 13.9 % (ref 11.5–15.5)
WBC: 5.9 10*3/uL (ref 4.0–10.5)

## 2017-08-20 LAB — COMPREHENSIVE METABOLIC PANEL
ALBUMIN: 3.7 g/dL (ref 3.5–5.0)
ALT: 33 U/L (ref 17–63)
ANION GAP: 12 (ref 5–15)
AST: 35 U/L (ref 15–41)
Alkaline Phosphatase: 92 U/L (ref 38–126)
BUN: 9 mg/dL (ref 6–20)
CALCIUM: 9.6 mg/dL (ref 8.9–10.3)
CO2: 30 mmol/L (ref 22–32)
Chloride: 93 mmol/L — ABNORMAL LOW (ref 101–111)
Creatinine, Ser: 1.23 mg/dL (ref 0.61–1.24)
GFR calc non Af Amer: 56 mL/min — ABNORMAL LOW (ref >60)
GLUCOSE: 121 mg/dL — AB (ref 65–99)
POTASSIUM: 4.7 mmol/L (ref 3.5–5.1)
SODIUM: 135 mmol/L (ref 135–145)
TOTAL PROTEIN: 8.2 g/dL — AB (ref 6.5–8.1)
Total Bilirubin: 0.6 mg/dL (ref 0.3–1.2)

## 2017-08-20 LAB — TROPONIN I: Troponin I: <0.03 ng/mL (ref <0.03)

## 2017-08-20 MED ORDER — SODIUM CHLORIDE 0.9 % IV BOLUS
500.0000 mL | Freq: Once | INTRAVENOUS | Status: AC
  Administered 2017-08-20: 500 mL via INTRAVENOUS

## 2017-08-20 NOTE — Discharge Instructions (Addendum)
Follow-up with your family doctor next week.  Discussed your headaches with your neurologist

## 2017-08-20 NOTE — ED Triage Notes (Signed)
Pt was trying to get out of bed, got dizzy and had a near syncopal episode. Pt still dizzy.  Pain under right rib cage with large bruise from previous fall

## 2017-08-20 NOTE — ED Provider Notes (Signed)
[facility] EMERGENCY DEPARTMENT Provider Note   CSN: [id number] Arrival date & time: 08/20/17  1517     History   Chief Complaint Chief Complaint  Patient presents with  . Near Syncope    HPI Curtis [name] is a 72 y.o. male.  Patient states he has fallen twice and hit his right chest.  Patient has a history of back glaucoma cannot see very well he is also got neuritis in his legs.  He sees neurologist for this  The history is provided by the patient. No language interpreter was used.  Fall  This is a new problem. The current episode started yesterday. The problem occurs rarely. The problem has been resolved. Associated symptoms include chest pain. Pertinent negatives include no abdominal pain and no headaches. Nothing aggravates the symptoms. Nothing relieves the symptoms. He has tried nothing for the symptoms. The treatment provided no relief.    Past Medical History:  Diagnosis Date  . AAA (abdominal aortic aneurysm), stable 04/20/2014  . Arthritis   . 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 Cancer ([place])   . Depression   . Diverticulitis    hospital 2011 [facility]  . Diverticulosis   . GERD (gastroesophageal reflux disease)   . Hernia of unspecified site of abdominal cavity without mention of obstruction or gangrene    hiatal  . Hyperlipidemia LDL goal <70 04/20/2014  . Hypertension   . IBS (irritable bowel syndrome)   . Myocardial infarction ([place]) 01/2014   NSTEMI  . Peripheral vascular disease (HCC)    ankle brachial index of 0.79 on the right and 0.61 on the left.   . 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 Reflex sympathetic dystrophy   . 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 SOB (shortness of breath)     Patient Active Problem List   Diagnosis Date Noted  . Carotid artery disease ([place]) 02/27/2016  . Acute hyponatremia 02/12/2015  . Hereditary and idiopathic peripheral neuropathy 01/30/2015  . Acute on chronic respiratory failure ([place]) 05/08/2014  . Anemia, iron  deficiency 05/08/2014  . Hyponatremia 05/08/2014  . Urinary retention 05/08/2014  . Chronic respiratory failure with hypoxia ([place]) 04/30/2014  . Shortness of breath   . Aspiration into airway   . HCAP (healthcare-associated pneumonia) 04/24/2014  . Hyperlipidemia LDL goal <70 04/20/2014  . AAA (abdominal aortic aneurysm), stable 04/20/2014  . Abdominal pain, generalized   . Chest pain   . SOB (shortness of breath)   . DOE (dyspnea on exertion), may be Brilinta this was stopped changed to Plavix 04/18/2014  . Peripheral vascular disease ([place])   . Presence of drug coated stent in left circumflex coronary artery: Promus DES 3.5 mm x 38 mm (3.9 mm) 01/17/2014    Class: Acute  . CAD (coronary artery disease), native coronary artery   . SVT (supraventricular tachycardia) s/p ablation 01/16/2014 01/16/2014  . 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  . TIA (transient ischemic attack) 09/06/2013  . HTN (hypertension) 09/05/2013  . TOBACCO ABUSE, hx 08/23/2007  . 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 Reflex sympathetic dystrophy 05/29/2007  . GERD 05/29/2007    Past Surgical History:  Procedure Laterality Date  . CARDIAC CATHETERIZATION    . CATARACT EXTRACTION    . CORONARY ANGIOPLASTY    . HERNIA REPAIR    . LEFT HEART CATHETERIZATION WITH CORONARY ANGIOGRAM N/A 01/17/2014   Procedure: LEFT HEART CATHETERIZATION WITH CORONARY ANGIOGRAM;  Surgeon: Leonie Man, MD;  Location: [facility] CATH LAB;  Service: Cardiovascular;  Laterality: N/A;  . left shoulder      x 3  . NISSEN FUNDOPLICATION    . right elbow surgery     x 2  . right knee arthroscopy    . squamous cell skin cancer     Left Hand  . SUPRAVENTRICULAR TACHYCARDIA ABLATION N/A 01/16/2014   Procedure: SUPRAVENTRICULAR TACHYCARDIA ABLATION;  Surgeon: Evans Lance, MD;  Location: [facility] CATH LAB;  Service: Cardiovascular;  Laterality: N/A;        Home Medications    Prior to  Admission medications   Medication Sig Start Date End Date Taking? Authorizing Provider  alprazolam [name]) 2 MG tablet Take 1 tablet (2 mg total) by mouth 2 (two) times daily. 05/02/14  Yes Theodis Blaze, MD  amitriptyline (ELAVIL) 100 MG tablet Take 100 mg by mouth at bedtime.   Yes [provider]  amLODipine (NORVASC) 2.5 MG tablet TAKE 1 TABLET BY MOUTH  DAILY 04/10/17  Yes Croitoru, Mihai, MD  aspirin EC 81 MG tablet Take 1 tablet (81 mg total) by mouth daily. Or Enteric Coated (EC) 81 mg 11/23/14  Yes Barrett, Evelene Croon, PA-C  bisacodyl (DULCOLAX) 5 MG EC tablet Take 5 mg by mouth daily as needed for mild constipation or moderate constipation.   Yes [provider]  finasteride (PROSCAR) 5 MG tablet Take 5 mg by mouth every morning.    Yes [provider]  gabapentin (NEURONTIN) 800 MG tablet TAKE 1 TABLET BY MOUTH 3  TIMES DAILY 06/25/17  Yes Kathrynn Ducking, MD  guaiFENesin (MUCINEX) 600 MG 12 hr tablet Take 600 mg by mouth 2 (two) times daily.   Yes [provider]  latanoprost (XALATAN) 0.005 % ophthalmic solution Place 1 drop into both eyes at bedtime.   Yes [provider]  tamsulosin (FLOMAX) 0.4 MG CAPS capsule Take 0.4 mg by mouth at bedtime.   Yes [provider]  nitroGLYCERIN (NITROSTAT) 0.4 MG SL tablet Place 1 tablet (0.4 mg total) under the tongue every 5 (five) minutes x 3 doses as needed for chest pain. 04/20/14   [name], NP  lamoTRIgine (LAMICTAL) 100 MG tablet Take 1 tablet (100 mg total) by mouth 2 (two) times daily. 01/23/16 01/29/16  Kathrynn Ducking, MD    Family History Family History  Problem Relation Age of Onset  . Colon cancer Mother   . Colon cancer Maternal Aunt   . Colon cancer Maternal Uncle   . Heart attack Neg Hx   . Stroke Neg Hx     Social History Social History   Tobacco Use  . Smoking status: Former Smoker    Packs/day: 2.50    Years: 50.00    Pack years: 125.00    Types:  Cigarettes    Last attempt to quit: 05/05/2005    Years since quitting: 12.3  . Smokeless tobacco: Former Systems developer    Quit date: 05/03/2012  Substance Use Topics  . Alcohol use: No    Alcohol/week: 0.0 oz    Comment: last use 2013  . Drug use: No     Allergies   Codeine and Trileptal [oxcarbazepine]   Review of Systems Review of Systems  Constitutional: Negative for appetite change and fatigue.  HENT: Negative for congestion, ear discharge and sinus pressure.   Eyes: Negative for discharge.  Respiratory: Negative for cough.   Cardiovascular: Positive for chest pain.  Gastrointestinal: Negative for abdominal pain and  diarrhea.  Genitourinary: Negative for frequency and hematuria.  Musculoskeletal: Negative for back pain.  Skin: Negative for rash.  Neurological: Negative for seizures and headaches.  Psychiatric/Behavioral: Negative for hallucinations.     Physical Exam Updated Vital Signs BP (!) 173/95 (BP Location: Right Arm)   Pulse 93   Temp 98.2 F (36.8 C) (Oral)   Resp 18   Ht 6' (1.829 m)   Wt 99.8 kg (220 lb)   SpO2 97%   BMI 29.84 kg/m   Physical Exam  Constitutional: He is oriented to person, place, and time. He appears well-developed.  HENT:  Head: Normocephalic.  Eyes: Conjunctivae and EOM are normal. No scleral icterus.  Neck: Neck supple. No thyromegaly present.  Cardiovascular: Normal rate and regular rhythm. Exam reveals no gallop and no friction rub.  No murmur heard. Patient has mild contusion to right lateral chest  Pulmonary/Chest: No stridor. He has no wheezes. He has no rales. He exhibits no tenderness.  Abdominal: He exhibits no distension. There is no tenderness. There is no rebound.  Musculoskeletal: Normal range of motion. He exhibits no edema.  Lymphadenopathy:    He has no cervical adenopathy.  Neurological: He is oriented to person, place, and time. He exhibits normal muscle tone. Coordination normal.  Skin: No rash noted. No erythema.   Psychiatric: He has a normal mood and affect. His behavior is normal.     ED Treatments / Results  Labs (all labs ordered are listed, but only abnormal results are displayed) Labs Reviewed  COMPREHENSIVE METABOLIC PANEL - Abnormal; Notable for the following components:      Result Value   Chloride 93 (*)    Glucose, Bld 121 (*)    Total Protein 8.2 (*)    GFR calc non Af Amer 56 (*)    All other components within normal limits  CBC WITH DIFFERENTIAL/PLATELET  TROPONIN I    EKG EKG Interpretation  Date/Time:  Thursday August 20 2017 07:11:24 EDT Ventricular Rate:  86 PR Interval:    QRS Duration: 143 QT Interval:  388 QTC Calculation: 465 R Axis:   35 Text Interpretation:  Sinus rhythm Prolonged PR interval Right bundle branch block Confirmed by Milton Ferguson [phone]) on 08/20/2017 10:07:55 AM Also confirmed by Milton Ferguson [phone])  on 08/20/2017 11:21:40 AM   Radiology Dg Ribs Unilateral W/chest Right  Result Date: 08/20/2017 CLINICAL DATA:  Pain following fall EXAM: RIGHT RIBS AND CHEST - 3+ VIEW COMPARISON:  Chest radiograph February 13, 2015 FINDINGS: Frontal chest as well as oblique and cone-down rib images obtained. There are pleural plaques bilaterally, stable. There are areas of scarring in both upper lobes and bases. There is no frank edema or consolidation. Heart size and pulmonary vascularity are normal. No adenopathy. There is aortic atherosclerosis. No pleural effusion or pneumothorax evident. No rib fracture evident. IMPRESSION: No evident rib fracture. 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 No frank edema or consolidation. There is aortic atherosclerosis. Aortic Atherosclerosis (ICD10-I70.0). Electronically Signed   By: Lowella Grip III M.D.   On: 08/20/2017 09:43   Dg Pelvis 1-2 Views  Result Date: 08/20/2017 CLINICAL DATA:  Pain following recent fall EXAM: PELVIS - 1-2 VIEW COMPARISON:  None. FINDINGS: There is no  evidence of pelvic fracture or dislocation. There is mild symmetric narrowing of both hip joints. There is degenerative change in the lower lumbar spine. IMPRESSION: Mild narrowing both hip joints.  No fracture or dislocation. Electronically Signed  By: Lowella Grip III M.D.   On: 08/20/2017 09:44   Ct Head Wo Contrast  Result Date: 08/20/2017 CLINICAL DATA:  Syncope dizziness, recent fall EXAM: CT HEAD WITHOUT CONTRAST CT CERVICAL SPINE WITHOUT CONTRAST TECHNIQUE: Multidetector CT imaging of the head and cervical spine was performed following the standard protocol without intravenous contrast. Multiplanar CT image reconstructions of the cervical spine were also generated. COMPARISON:  None. CT head 09/06/2013 FINDINGS: CT HEAD FINDINGS Brain: Moderate atrophy. Negative for hydrocephalus. Chronic microvascular ischemic change. Negative for acute infarct, hemorrhage, or mass lesion. Vascular: Negative for hyperdense vessel Skull: Negative Sinuses/Orbits: Paranasal sinuses clear. Bilateral cataract surgery. Other: None CT CERVICAL SPINE FINDINGS Alignment: Normal Skull base and vertebrae: Negative for fracture or mass Soft tissues and spinal canal: Negative Disc levels: Mild disc degeneration and uncinate spurring at C3-4, C4-5, C5-6. Moderate disc degeneration and spurring at C5-6 with spinal and foraminal stenosis bilaterally. Upper chest: Apical scarring bilaterally Other: None IMPRESSION: 1. No acute intracranial abnormality. Atrophy and chronic microvascular ischemia 2. Cervical spondylosis without acute abnormality. Electronically Signed   By: Franchot Gallo M.D.   On: 08/20/2017 09:28   Ct Cervical Spine Wo Contrast  Result Date: 08/20/2017 CLINICAL DATA:  Syncope dizziness, recent fall EXAM: CT HEAD WITHOUT CONTRAST CT CERVICAL SPINE WITHOUT CONTRAST TECHNIQUE: Multidetector CT imaging of the head and cervical spine was performed following the standard protocol without intravenous contrast.  Multiplanar CT image reconstructions of the cervical spine were also generated. COMPARISON:  None. CT head 09/06/2013 FINDINGS: CT HEAD FINDINGS Brain: Moderate atrophy. Negative for hydrocephalus. Chronic microvascular ischemic change. Negative for acute infarct, hemorrhage, or mass lesion. Vascular: Negative for hyperdense vessel Skull: Negative Sinuses/Orbits: Paranasal sinuses clear. Bilateral cataract surgery. Other: None CT CERVICAL SPINE FINDINGS Alignment: Normal Skull base and vertebrae: Negative for fracture or mass Soft tissues and spinal canal: Negative Disc levels: Mild disc degeneration and uncinate spurring at C3-4, C4-5, C5-6. Moderate disc degeneration and spurring at C5-6 with spinal and foraminal stenosis bilaterally. Upper chest: Apical scarring bilaterally Other: None IMPRESSION: 1. No acute intracranial abnormality. Atrophy and chronic microvascular ischemia 2. Cervical spondylosis without acute abnormality. Electronically Signed   By: Franchot Gallo M.D.   On: 08/20/2017 09:28    Procedures Procedures (including critical care time)  Medications Ordered in ED Medications  sodium chloride 0.9 % bolus 500 mL (0 mLs Intravenous Stopped 08/20/17 0959)     Initial Impression / Assessment and Plan / ED Course  I have reviewed the triage vital signs and the nursing notes.  Pertinent labs & imaging results that were available during my care of the patient were reviewed by me and considered in my medical decision making (see chart for details).     Patient with fall and contusion to right chest.  Most likely related to his glaucoma and his neuropathy.  Patient had CBC chemistries troponin were unremarkable along with CT head and CT of the neck.  Along with chest x-ray and pelvis films.  X-rays are unremarkable and these were reviewed by me.  Patient will follow up with his PCP and his neurologist  Final Clinical Impressions(s) / ED Diagnoses   Final diagnoses:  Fall, initial  encounter    ED Discharge Orders    None       Milton Ferguson, MD 08/20/17 1142

## 2017-08-20 NOTE — ED Notes (Signed)
Patient transported to X-ray 

## 2017-08-20 NOTE — ED Notes (Signed)
Orthostatic vs done per request of dr. Roderic Palau.

## 2017-09-21 ENCOUNTER — Other Ambulatory Visit: Payer: Self-pay | Admitting: Cardiology

## 2017-09-21 ENCOUNTER — Other Ambulatory Visit: Payer: Self-pay | Admitting: Neurology

## 2017-09-22 NOTE — Telephone Encounter (Signed)
REFILL 

## 2017-09-23 ENCOUNTER — Ambulatory Visit: Payer: Medicare Other | Admitting: Adult Health

## 2017-09-25 ENCOUNTER — Other Ambulatory Visit: Payer: Self-pay | Admitting: Neurology

## 2017-09-29 ENCOUNTER — Telehealth: Payer: Self-pay | Admitting: Neurology

## 2017-09-29 NOTE — Telephone Encounter (Signed)
Pt returned RN's call. Appt has been r/s to 5/30 @ 7:30 check in at 7:10.

## 2017-09-29 NOTE — Telephone Encounter (Signed)
Called pt. Advised it has been over a year since he has been seen. Advised he should f/u once yearly at least to continue receiving medication refills. Offered appt for 10/01/17 at 730am. He is going to call son to see if he can bring him. He will call back to let me know.  I also recommended he could contact his PCP to see if they are willing to refill for him. He states PCP off on Tuesdays. He would like to f/u with [place].

## 2017-09-29 NOTE — Telephone Encounter (Signed)
Pt called regarding refill for gabapentin (NEURONTIN) 800 MG tablet. I relayed to him the request has been rec'd from [facility] on Friday and is now waiting for provider to approve it. Please call when it has been sent.

## 2017-09-29 NOTE — Telephone Encounter (Signed)
Pt has not been seen since 06/2016. Cx appt on 09/23/17 d/t IBS sx it appears. R/s to 02/2018 w/ MM,NP. Needs sooner appt. Will speak w/ MM,NP about fitting him in sooner

## 2017-09-29 NOTE — Telephone Encounter (Signed)
Noted, thank you

## 2017-10-01 ENCOUNTER — Ambulatory Visit (INDEPENDENT_AMBULATORY_CARE_PROVIDER_SITE_OTHER): Payer: Medicare Other | Admitting: Adult Health

## 2017-10-01 ENCOUNTER — Encounter

## 2017-10-01 ENCOUNTER — Encounter: Payer: Self-pay | Admitting: Adult Health

## 2017-10-01 VITALS — BP 156/88 | HR 90 | Ht 72.0 in | Wt 222.2 lb

## 2017-10-01 DIAGNOSIS — G609 Hereditary and idiopathic neuropathy, unspecified: Secondary | ICD-10-CM | POA: Diagnosis not present

## 2017-10-01 MED ORDER — GABAPENTIN 800 MG PO TABS: 800.0000 mg | ORAL_TABLET | Freq: Three times a day (TID) | ORAL | 3 refills | Status: DC

## 2017-10-01 NOTE — Progress Notes (Signed)
PATIENT: Curtis [name] DOB: 09/23/1945  REASON FOR VISIT: follow up HISTORY FROM: patient  HISTORY OF PRESENT ILLNESS: Today 10/01/17:  [name] is a 72 year old male with a history of peripheral neuropathy.  He returns today for follow-up.  He reports that he has good days and bad days regarding his neuropathy.  He states that he has a burning pain in the hands and feet.  Denies any significant changes with the gait and balance.  He states that he did have a fall in April.  Reports that he got out of bed around 5:30 AM and had a sharp pain in his right side.  He reports that he thinks he may have passed out and fell to the floor and the nightstand fell on top of him.  He was able to call his son and was taken to the emergency room.  Fortunately he did not suffer any injuries.  The patient continues on gabapentin taking 800 mg 3 times a day.  He is also on amitriptyline 100 mg at bedtime.  He returns today for evaluation.  HISTORY [name] is a 72 year old right handed white male with a history of a peripheral neuropathy with severe discomfort. He currently is on gabapentin taking 800 mg 3 times daily and 100 mg of amitriptyline at night. In the past he has been on Lamictal without benefit, and he has taken Trileptal which resulted in hyponatremia. The patient indicates that over the last month his discomfort has actually improved some for unknown reason. He will have good days and bad days with the pain. The patient is having more good days. He patient indicates that cold exposure will worsen the burning sensations in the feet and hands. He indicates that he rarely ventures outside of the house more than 2 or 3 times a month. He denies any significant problems with balance, he occasionally will use a cane for ambulation. He has not had any recent falls. He returns to this office for an evaluation.    REVIEW OF SYSTEMS: Out of a complete 14 system review of symptoms, the patient complains  only of the following symptoms, and all other reviewed systems are negative.  Fatigue, light sensitivity, diarrhea, nausea, frequency of urination, daytime sleepiness, restless leg  ALLERGIES: Allergies  Allergen Reactions  . Codeine Itching  . Trileptal [Oxcarbazepine]     Other reaction(s): Other (See Comments) Abnormal sodium levels Hyponatremia    HOME MEDICATIONS: Outpatient Medications Prior to Visit  Medication Sig Dispense Refill  . alprazolam (XANAX) 2 MG tablet Take 1 tablet (2 mg total) by mouth 2 (two) times daily. 30 tablet 0  . amitriptyline (ELAVIL) 100 MG tablet Take 100 mg by mouth at bedtime.    [name] amLODipine (NORVASC) 2.5 MG tablet TAKE 1 TABLET BY MOUTH  DAILY 15 tablet 0  . aspirin EC 81 MG tablet Take 1 tablet (81 mg total) by mouth daily. Or Enteric Coated (EC) 81 mg    . bisacodyl (DULCOLAX) 5 MG EC tablet Take 5 mg by mouth daily as needed for mild constipation or moderate constipation.    . docusate sodium (COLACE) 100 MG capsule Take 100 mg by mouth daily as needed for mild constipation.    . finasteride (PROSCAR) 5 MG tablet Take 5 mg by mouth every morning.     . gabapentin (NEURONTIN) 800 MG tablet TAKE 1 TABLET BY MOUTH 3  TIMES DAILY 270 tablet 0  . guaiFENesin (MUCINEX) 600 MG 12  hr tablet Take by mouth 2 (two) times daily as needed.    . latanoprost (XALATAN) 0.005 % ophthalmic solution Place 1 drop into both eyes at bedtime.    . metoprolol succinate (TOPROL-XL) 25 MG 24 hr tablet Take 12.5 mg by mouth 2 (two) times daily.    . tamsulosin (FLOMAX) 0.4 MG CAPS capsule Take 0.4 mg by mouth at bedtime.    [name] atorvastatin (LIPITOR) 40 MG tablet Take 1 tablet (40 mg total) by mouth daily at 6 PM. NEED OV. (Patient not taking: Reported on 10/01/2017) 15 tablet 0  . nitroGLYCERIN (NITROSTAT) 0.4 MG SL tablet Place 1 tablet (0.4 mg total) under the tongue every 5 (five) minutes x 3 doses as needed for chest pain. (Patient not taking: Reported on 10/01/2017) 25  tablet 4  . guaiFENesin (MUCINEX) 600 MG 12 hr tablet Take 600 mg by mouth 2 (two) times daily.     No facility-administered medications prior to visit.     PAST MEDICAL HISTORY: Past Medical History:  Diagnosis Date  . AAA (abdominal aortic aneurysm), stable 04/20/2014  . Arthritis   . 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 Cancer ([place])   . Depression   . Diverticulitis    hospital 2011 [facility]  . Diverticulosis   . GERD (gastroesophageal reflux disease)   . 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 Hernia of unspecified site of abdominal cavity without mention of obstruction or gangrene    hiatal  . Hyperlipidemia LDL goal <70 04/20/2014  . Hypertension   . IBS (irritable bowel syndrome)   . Myocardial infarction ([place]) 01/2014   NSTEMI  . Peripheral vascular disease ([facility])    ankle brachial index of 0.79 on the right and 0.61 on the left.   . 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 Reflex sympathetic dystrophy   . 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 SOB (shortness of breath)     PAST SURGICAL HISTORY: Past Surgical History:  Procedure Laterality Date  . CARDIAC CATHETERIZATION    . CATARACT EXTRACTION    . CORONARY ANGIOPLASTY    . HERNIA REPAIR    . LEFT HEART CATHETERIZATION WITH CORONARY ANGIOGRAM N/A 01/17/2014   Procedure: LEFT HEART CATHETERIZATION WITH CORONARY ANGIOGRAM;  Surgeon: Leonie Man, MD;  Location: [facility] CATH LAB;  Service: Cardiovascular;  Laterality: N/A;  . left shoulder      x 3  . NISSEN FUNDOPLICATION    . right elbow surgery     x 2  . right knee arthroscopy    . squamous cell skin cancer     Left Hand  . SUPRAVENTRICULAR TACHYCARDIA ABLATION N/A 01/16/2014   Procedure: SUPRAVENTRICULAR TACHYCARDIA ABLATION;  Surgeon: Evans Lance, MD;  Location[facility] CATH LAB;  Service: Cardiovascular;  Laterality: N/A;    FAMILY HISTORY: Family History  Problem Relation Age of Onset  . Colon cancer Mother   . Colon cancer Maternal Aunt   . Colon cancer Maternal Uncle     . Heart attack Neg Hx   . Stroke Neg Hx     SOCIAL HISTORY: Social History   Socioeconomic History  . Marital status: Widowed    Spouse name: Not on file  . Number of children: 2  . Years of education: 13  . Highest education level: Not on file  Occupational History  . Occupation: [employer]  . Financial resource strain: Not on file  . Food insecurity:    Worry: Not on file  Inability: Not on file  . Transportation needs:    Medical: Not on file    Non-medical: Not on file  Tobacco Use  . Smoking status: Former Smoker    Packs/day: 2.50    Years: 50.00    Pack years: 125.00    Types: Cigarettes    Last attempt to quit: 05/05/2005    Years since quitting: 12.4  . Smokeless tobacco: Former Systems developer    Quit date: 05/03/2012  Substance and Sexual Activity  . Alcohol use: No    Alcohol/week: 0.0 oz    Comment: last use 2013  . Drug use: No  . Sexual activity: Not on file    Comment: widowed, Veteran  Lifestyle  . Physical activity:    Days per week: Not on file    Minutes per session: Not on file  . Stress: Not on file  Relationships  . Social connections:    Talks on phone: Not on file    Gets together: Not on file    Attends religious service: Not on file    Active member of club or organization: Not on file    Attends meetings of clubs or organizations: Not on file    Relationship status: Not on file  . Intimate partner violence:    Fear of current or ex partner: Not on file    Emotionally abused: Not on file    Physically abused: Not on file    Forced sexual activity: Not on file  Other Topics Concern  . Not on file  Social History Narrative   Patient drinks 3 cups of caffeine daily.   Patient is right handed.      PHYSICAL EXAM  Vitals:   10/01/17 0716  BP: (!) 156/88  Pulse: 90  Weight: 222 lb 3.2 oz (100.8 kg)  Height: 6' (1.829 m)   Body mass index is 30.14 kg/m.  Generalized: Well developed, in no acute distress    Neurological examination  Mentation: Alert oriented to time, place, history taking. Follows all commands speech and language fluent Cranial nerve II-XII: Pupils were equal round reactive to light. Extraocular movements were full, visual field were full on confrontational test. Facial sensation and strength were normal. Uvula tongue midline. Head turning and shoulder shrug  were normal and symmetric. Motor: The motor testing reveals 5 over 5 strength of all 4 extremities. Good symmetric motor tone is noted throughout.  Sensory: Sensory testing is intact to soft touch on all 4 extremities. No evidence of extinction is noted.  Coordination: Cerebellar testing reveals good finger-nose-finger and heel-to-shin bilaterally.  Gait and station: Gait is slightly wide based.  Tandem gait not attempted. Reflexes: Deep tendon reflexes are symmetric and normal bilaterally.   DIAGNOSTIC DATA (LABS, IMAGING, TESTING) - I reviewed patient records, labs, notes, testing and imaging myself where available.  Lab Results  Component Value Date   WBC 5.9 08/20/2017   HGB 15.1 08/20/2017   HCT 46.4 08/20/2017   MCV 93.0 08/20/2017   PLT 279 08/20/2017      Component Value Date/Time   NA 135 08/20/2017 0841   K 4.7 08/20/2017 0841   CL 93 (L) 08/20/2017 0841   CO2 30 08/20/2017 0841   GLUCOSE 121 (H) 08/20/2017 0841   BUN 9 08/20/2017 0841   CREATININE 1.23 08/20/2017 0841   CALCIUM 9.6 08/20/2017 0841   PROT 8.2 (H) 08/20/2017 0841   PROT 7.6 01/30/2015 1455   ALBUMIN 3.7 08/20/2017 0841   AST 35 08/20/2017  0841   ALT 33 08/20/2017 0841   ALKPHOS 92 08/20/2017 0841   BILITOT 0.6 08/20/2017 0841   GFRNONAA 56 (L) 08/20/2017 0841   GFRAA >60 08/20/2017 0841   Lab Results  Component Value Date   CHOL 102 04/18/2014   HDL 24 (L) 04/18/2014   LDLCALC 34 04/18/2014   LDLDIRECT 43.7 04/03/2014   TRIG 222 (H) 04/18/2014   CHOLHDL 4.3 04/18/2014   Lab Results  Component Value Date   HGBA1C 6.2  (H) 09/07/2013   No results found for: VITAMINB12 Lab Results  Component Value Date   TSH 1.496 02/12/2015      ASSESSMENT AND PLAN 72 y.o. 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 here with:  1.  Peripheral neuropathy   The patient's symptoms have remained relatively stable.  He continues to have good days and bad days.  He will remain on gabapentin 800 mg 3 times a day as well as amitriptyline at bedtime.  He is advised that if his symptoms worsen or he develops new symptoms he will let us know.  He will follow-up in 1 year or sooner if needed.   I spent 15 minutes with the patient. 50% of this time was spent discussing his medication and symptoms.   Ward Givens, MSN, NP-C 10/01/2017, 7:31 AM [facility] [address], [place], [place] [address] [phone]

## 2017-10-01 NOTE — Patient Instructions (Signed)
Your Plan:  Continue gabapentin and Amitriptyline If your symptoms worsen or you develop new symptoms please let us know.   Thank you for coming to see [place] at [facility]. I hope we have been able to provide you high quality care today.  You may receive a patient satisfaction survey over the next few weeks. We would appreciate your feedback and comments so that we may continue to improve ourselves and the health of our patients.

## 2017-10-02 ENCOUNTER — Encounter: Payer: Self-pay | Admitting: Adult Health

## 2017-10-02 NOTE — Progress Notes (Signed)
I have read the note, and I agree with the clinical assessment and plan.  Keili Hasten K Netta Fodge   

## 2018-02-23 ENCOUNTER — Ambulatory Visit: Payer: Medicare Other | Admitting: Adult Health

## 2018-02-28 DIAGNOSIS — F4323 Adjustment disorder with mixed anxiety and depressed mood: Secondary | ICD-10-CM | POA: Insufficient documentation

## 2018-07-03 ENCOUNTER — Other Ambulatory Visit: Payer: Self-pay | Admitting: Adult Health

## 2018-08-19 ENCOUNTER — Other Ambulatory Visit: Payer: Self-pay | Admitting: Adult Health

## 2018-08-23 ENCOUNTER — Other Ambulatory Visit: Payer: Self-pay | Admitting: *Deleted

## 2018-08-23 MED ORDER — GABAPENTIN 800 MG PO TABS: 800.0000 mg | ORAL_TABLET | Freq: Three times a day (TID) | ORAL | 0 refills | Status: DC

## 2018-08-23 NOTE — Telephone Encounter (Signed)
PT has appt 11-02-18 with Butler Denmark, NP 10-06-18 at [phone].  Received request for future refills. Will do 90 days supply.

## 2018-10-06 ENCOUNTER — Ambulatory Visit: Payer: Medicare Other | Admitting: Neurology

## 2018-10-06 ENCOUNTER — Ambulatory Visit: Payer: Medicare Other | Admitting: Adult Health

## 2018-10-18 ENCOUNTER — Other Ambulatory Visit: Payer: Self-pay | Admitting: Adult Health

## 2018-10-24 ENCOUNTER — Other Ambulatory Visit: Payer: Self-pay | Admitting: Adult Health

## 2018-10-28 ENCOUNTER — Telehealth: Payer: Self-pay | Admitting: Adult Health

## 2018-10-28 NOTE — Telephone Encounter (Signed)
Noted  

## 2018-10-28 NOTE — Telephone Encounter (Signed)
Pt has called to inform that he has been in his home since the 1st of the year as a result of Covid-19. Pt does not want to come into the office for a med check at this time.  Pt wants Megan to know that he has enough gabapentin (NEURONTIN) 800 MG tablet to last him a few months.  Pt will call back as he gets close to running out, then he will look into some type of an appointment.  This is a FYI no call back requested

## 2018-12-15 ENCOUNTER — Other Ambulatory Visit: Payer: Self-pay

## 2018-12-15 ENCOUNTER — Inpatient Hospital Stay (HOSPITAL_COMMUNITY)
Admission: EM | Admit: 2018-12-15 | Discharge: 2018-12-19 | DRG: 871 | Disposition: A | Discharge status: Home Health | Payer: Medicare Other | Attending: Internal Medicine | Admitting: Internal Medicine

## 2018-12-15 ENCOUNTER — Emergency Department (HOSPITAL_COMMUNITY): Payer: Medicare Other

## 2018-12-15 ENCOUNTER — Encounter (HOSPITAL_COMMUNITY): Payer: Self-pay

## 2018-12-15 DIAGNOSIS — J189 Pneumonia, unspecified organism: Secondary | ICD-10-CM | POA: Diagnosis present

## 2018-12-15 DIAGNOSIS — Z800 Family history of malignant neoplasm of digestive organs: Secondary | ICD-10-CM

## 2018-12-15 DIAGNOSIS — R296 Repeated falls: Secondary | ICD-10-CM | POA: Diagnosis present

## 2018-12-15 DIAGNOSIS — I119 Hypertensive heart disease without heart failure: Secondary | ICD-10-CM | POA: Diagnosis present

## 2018-12-15 DIAGNOSIS — R6520 Severe sepsis without septic shock: Secondary | ICD-10-CM

## 2018-12-15 DIAGNOSIS — Z9861 Coronary angioplasty status: Secondary | ICD-10-CM

## 2018-12-15 DIAGNOSIS — R338 Other retention of urine: Secondary | ICD-10-CM | POA: Diagnosis present

## 2018-12-15 DIAGNOSIS — N179 Acute kidney failure, unspecified: Secondary | ICD-10-CM | POA: Diagnosis not present

## 2018-12-15 DIAGNOSIS — Z7982 Long term (current) use of aspirin: Secondary | ICD-10-CM

## 2018-12-15 DIAGNOSIS — J440 Chronic obstructive pulmonary disease with acute lower respiratory infection: Secondary | ICD-10-CM | POA: Diagnosis present

## 2018-12-15 DIAGNOSIS — W1830XA Fall on same level, unspecified, initial encounter: Secondary | ICD-10-CM | POA: Diagnosis present

## 2018-12-15 DIAGNOSIS — A419 Sepsis, unspecified organism: Secondary | ICD-10-CM | POA: Diagnosis not present

## 2018-12-15 DIAGNOSIS — F419 Anxiety disorder, unspecified: Secondary | ICD-10-CM | POA: Diagnosis present

## 2018-12-15 DIAGNOSIS — N401 Enlarged prostate with lower urinary tract symptoms: Secondary | ICD-10-CM | POA: Diagnosis present

## 2018-12-15 DIAGNOSIS — Z20828 Contact with and (suspected) exposure to other viral communicable diseases: Secondary | ICD-10-CM | POA: Diagnosis present

## 2018-12-15 DIAGNOSIS — E872 Acidosis: Secondary | ICD-10-CM | POA: Diagnosis present

## 2018-12-15 DIAGNOSIS — I10 Essential (primary) hypertension: Secondary | ICD-10-CM | POA: Diagnosis present

## 2018-12-15 DIAGNOSIS — J9601 Acute respiratory failure with hypoxia: Secondary | ICD-10-CM | POA: Diagnosis not present

## 2018-12-15 DIAGNOSIS — I2510 Atherosclerotic heart disease of native coronary artery without angina pectoris: Secondary | ICD-10-CM | POA: Diagnosis present

## 2018-12-15 DIAGNOSIS — J61 Pneumoconiosis due to asbestos and other mineral fibers: Secondary | ICD-10-CM | POA: Diagnosis present

## 2018-12-15 DIAGNOSIS — E875 Hyperkalemia: Secondary | ICD-10-CM | POA: Diagnosis present

## 2018-12-15 DIAGNOSIS — K219 Gastro-esophageal reflux disease without esophagitis: Secondary | ICD-10-CM | POA: Diagnosis present

## 2018-12-15 DIAGNOSIS — K589 Irritable bowel syndrome without diarrhea: Secondary | ICD-10-CM | POA: Diagnosis present

## 2018-12-15 DIAGNOSIS — R509 Fever, unspecified: Secondary | ICD-10-CM

## 2018-12-15 DIAGNOSIS — I252 Old myocardial infarction: Secondary | ICD-10-CM

## 2018-12-15 DIAGNOSIS — E785 Hyperlipidemia, unspecified: Secondary | ICD-10-CM | POA: Diagnosis present

## 2018-12-15 DIAGNOSIS — G9050 Complex regional pain syndrome I, unspecified: Secondary | ICD-10-CM | POA: Diagnosis present

## 2018-12-15 DIAGNOSIS — F329 Major depressive disorder, single episode, unspecified: Secondary | ICD-10-CM | POA: Diagnosis present

## 2018-12-15 DIAGNOSIS — I739 Peripheral vascular disease, unspecified: Secondary | ICD-10-CM | POA: Diagnosis present

## 2018-12-15 DIAGNOSIS — J9602 Acute respiratory failure with hypercapnia: Secondary | ICD-10-CM

## 2018-12-15 LAB — CBC WITH DIFFERENTIAL/PLATELET
Abs Immature Granulocytes: 0.17 10*3/uL — ABNORMAL HIGH (ref 0.00–0.07)
Basophils Absolute: 0.1 10*3/uL (ref 0.0–0.1)
Basophils Relative: 1 %
Eosinophils Absolute: 0 10*3/uL (ref 0.0–0.5)
Eosinophils Relative: 0 %
HCT: 50.2 % (ref 39.0–52.0)
Hemoglobin: 14.9 g/dL (ref 13.0–17.0)
Immature Granulocytes: 1 %
Lymphocytes Relative: 7 %
Lymphs Abs: 1 10*3/uL (ref 0.7–4.0)
MCH: 27.5 pg (ref 26.0–34.0)
MCHC: 29.7 g/dL — ABNORMAL LOW (ref 30.0–36.0)
MCV: 92.8 fL (ref 80.0–100.0)
Monocytes Absolute: 1.2 10*3/uL — ABNORMAL HIGH (ref 0.1–1.0)
Monocytes Relative: 8 %
Neutro Abs: 12.4 10*3/uL — ABNORMAL HIGH (ref 1.7–7.7)
Neutrophils Relative %: 83 %
Platelets: 260 10*3/uL (ref 150–400)
RBC: 5.41 MIL/uL (ref 4.22–5.81)
RDW: 19.9 % — ABNORMAL HIGH (ref 11.5–15.5)
WBC: 15 10*3/uL — ABNORMAL HIGH (ref 4.0–10.5)
nRBC: 0.7 % — ABNORMAL HIGH (ref 0.0–0.2)

## 2018-12-15 LAB — COMPREHENSIVE METABOLIC PANEL
ALT: 11 U/L (ref 0–44)
AST: 20 U/L (ref 15–41)
Albumin: 3.5 g/dL (ref 3.5–5.0)
Alkaline Phosphatase: 80 U/L (ref 38–126)
Anion gap: 13 (ref 5–15)
BUN: 24 mg/dL — ABNORMAL HIGH (ref 8–23)
CO2: 27 mmol/L (ref 22–32)
Calcium: 9.1 mg/dL (ref 8.9–10.3)
Chloride: 94 mmol/L — ABNORMAL LOW (ref 98–111)
Creatinine, Ser: 2.05 mg/dL — ABNORMAL HIGH (ref 0.61–1.24)
GFR calc Af Amer: 36 mL/min — ABNORMAL LOW (ref >60)
GFR calc non Af Amer: 31 mL/min — ABNORMAL LOW (ref >60)
Glucose, Bld: 138 mg/dL — ABNORMAL HIGH (ref 70–99)
Potassium: 5.3 mmol/L — ABNORMAL HIGH (ref 3.5–5.1)
Sodium: 134 mmol/L — ABNORMAL LOW (ref 135–145)
Total Bilirubin: 0.7 mg/dL (ref 0.3–1.2)
Total Protein: 8.1 g/dL (ref 6.5–8.1)

## 2018-12-15 LAB — SARS CORONAVIRUS 2 BY RT PCR (HOSPITAL ORDER, PERFORMED IN ~~LOC~~ HOSPITAL LAB): SARS Coronavirus 2: NEGATIVE

## 2018-12-15 LAB — BLOOD GAS, ARTERIAL
Acid-Base Excess: 3.1 mmol/L — ABNORMAL HIGH (ref 0.0–2.0)
Bicarbonate: 25 mmol/L (ref 20.0–28.0)
FIO2: 36
O2 Saturation: 93.2 %
Patient temperature: 38.7
pCO2 arterial: 70.8 mmHg (ref 32.0–48.0)
pH, Arterial: 7.247 — ABNORMAL LOW (ref 7.350–7.450)
pO2, Arterial: 85.4 mmHg (ref 83.0–108.0)

## 2018-12-15 LAB — LACTIC ACID, PLASMA
Lactic Acid, Venous: 3.4 mmol/L (ref 0.5–1.9)
Lactic Acid, Venous: 3.5 mmol/L (ref 0.5–1.9)

## 2018-12-15 MED ORDER — VANCOMYCIN HCL 10 G IV SOLR: 2000.0000 mg | Freq: Once | INTRAVENOUS | Status: DC

## 2018-12-15 MED ORDER — SODIUM CHLORIDE 0.9 % IV SOLN: INTRAVENOUS | Status: DC | PRN

## 2018-12-15 MED ORDER — VANCOMYCIN HCL IN DEXTROSE 1-5 GM/200ML-% IV SOLN
1000.0000 mg | Freq: Once | INTRAVENOUS | Status: AC
  Administered 2018-12-15: 1000 mg via INTRAVENOUS
  Filled 2018-12-15: qty 200

## 2018-12-15 MED ORDER — IPRATROPIUM-ALBUTEROL 0.5-2.5 (3) MG/3ML IN SOLN
3.0000 mL | Freq: Four times a day (QID) | RESPIRATORY_TRACT | Status: DC
  Administered 2018-12-16 – 2018-12-19 (×13): 3 mL via RESPIRATORY_TRACT
  Filled 2018-12-15 (×13): qty 3

## 2018-12-15 MED ORDER — SODIUM CHLORIDE 0.9 % IV SOLN
2.0000 g | Freq: Once | INTRAVENOUS | Status: AC
  Administered 2018-12-15: 2 g via INTRAVENOUS
  Filled 2018-12-15: qty 2

## 2018-12-15 MED ORDER — TAMSULOSIN HCL 0.4 MG PO CAPS
0.4000 mg | ORAL_CAPSULE | Freq: Every day | ORAL | Status: DC
  Administered 2018-12-16 – 2018-12-18 (×3): 0.4 mg via ORAL
  Filled 2018-12-15 (×3): qty 1

## 2018-12-15 MED ORDER — ATORVASTATIN CALCIUM 40 MG PO TABS
40.0000 mg | ORAL_TABLET | Freq: Every day | ORAL | Status: DC
  Administered 2018-12-16 – 2018-12-18 (×3): 40 mg via ORAL
  Filled 2018-12-15 (×3): qty 1

## 2018-12-15 MED ORDER — ASPIRIN EC 81 MG PO TBEC
81.0000 mg | DELAYED_RELEASE_TABLET | Freq: Every day | ORAL | Status: DC
  Administered 2018-12-16 – 2018-12-19 (×4): 81 mg via ORAL
  Filled 2018-12-15 (×4): qty 1

## 2018-12-15 MED ORDER — SODIUM CHLORIDE 0.9 % IV SOLN
INTRAVENOUS | Status: AC
  Administered 2018-12-15 – 2018-12-16 (×3): via INTRAVENOUS

## 2018-12-15 MED ORDER — ACETAMINOPHEN 650 MG RE SUPP: 650.0000 mg | Freq: Four times a day (QID) | RECTAL | Status: DC | PRN

## 2018-12-15 MED ORDER — SODIUM CHLORIDE 0.9 % IV SOLN
INTRAVENOUS | Status: DC
  Administered 2018-12-16: 04:00:00 via INTRAVENOUS

## 2018-12-15 MED ORDER — SODIUM CHLORIDE 0.9 % IV SOLN: 1.0000 g | Freq: Once | INTRAVENOUS | Status: DC

## 2018-12-15 MED ORDER — FINASTERIDE 5 MG PO TABS
5.0000 mg | ORAL_TABLET | Freq: Every day | ORAL | Status: DC
  Administered 2018-12-16 – 2018-12-19 (×4): 5 mg via ORAL
  Filled 2018-12-15 (×6): qty 1

## 2018-12-15 MED ORDER — ENOXAPARIN SODIUM 40 MG/0.4ML ~~LOC~~ SOLN
40.0000 mg | SUBCUTANEOUS | Status: DC
  Administered 2018-12-16: 40 mg via SUBCUTANEOUS
  Filled 2018-12-15: qty 0.4

## 2018-12-15 MED ORDER — SODIUM CHLORIDE 0.9 % IV SOLN
1.0000 g | INTRAVENOUS | Status: DC
  Administered 2018-12-15 – 2018-12-18 (×4): 1 g via INTRAVENOUS
  Filled 2018-12-15 (×4): qty 10

## 2018-12-15 MED ORDER — NALOXONE HCL 0.4 MG/ML IJ SOLN
INTRAMUSCULAR | Status: AC
  Administered 2018-12-15: 0.4 mg via INTRAVENOUS
  Filled 2018-12-15: qty 1

## 2018-12-15 MED ORDER — SODIUM CHLORIDE 0.9 % IV SOLN
500.0000 mg | INTRAVENOUS | Status: DC
  Administered 2018-12-15 – 2018-12-17 (×3): 500 mg via INTRAVENOUS
  Filled 2018-12-15 (×3): qty 500

## 2018-12-15 MED ORDER — SODIUM CHLORIDE 0.9 % IV BOLUS
1000.0000 mL | Freq: Once | INTRAVENOUS | Status: AC
  Administered 2018-12-15: 1000 mL via INTRAVENOUS

## 2018-12-15 MED ORDER — SODIUM CHLORIDE 0.9% FLUSH
3.0000 mL | Freq: Once | INTRAVENOUS | Status: AC
  Administered 2018-12-15: 3 mL via INTRAVENOUS

## 2018-12-15 MED ORDER — BISACODYL 5 MG PO TBEC: 5.0000 mg | DELAYED_RELEASE_TABLET | Freq: Every day | ORAL | Status: DC | PRN

## 2018-12-15 MED ORDER — ACETAMINOPHEN 325 MG PO TABS: 650.0000 mg | ORAL_TABLET | Freq: Once | ORAL | Status: DC

## 2018-12-15 MED ORDER — ACETAMINOPHEN 325 MG PO TABS
650.0000 mg | ORAL_TABLET | Freq: Once | ORAL | Status: AC
  Administered 2018-12-15: 650 mg via ORAL
  Filled 2018-12-15: qty 2

## 2018-12-15 MED ORDER — IPRATROPIUM-ALBUTEROL 0.5-2.5 (3) MG/3ML IN SOLN
3.0000 mL | Freq: Once | RESPIRATORY_TRACT | Status: AC
  Administered 2018-12-15: 3 mL via RESPIRATORY_TRACT
  Filled 2018-12-15: qty 3

## 2018-12-15 MED ORDER — FENTANYL CITRATE (PF) 100 MCG/2ML IJ SOLN
50.0000 ug | Freq: Once | INTRAMUSCULAR | Status: AC
  Administered 2018-12-15: 17:00:00 50 ug via INTRAVENOUS
  Filled 2018-12-15: qty 2

## 2018-12-15 MED ORDER — ONDANSETRON HCL 4 MG/2ML IJ SOLN: 4.0000 mg | Freq: Four times a day (QID) | INTRAMUSCULAR | Status: DC | PRN

## 2018-12-15 MED ORDER — ACETAMINOPHEN 325 MG PO TABS
650.0000 mg | ORAL_TABLET | Freq: Four times a day (QID) | ORAL | Status: DC | PRN
  Administered 2018-12-18: 650 mg via ORAL
  Filled 2018-12-15: qty 2

## 2018-12-15 MED ORDER — ONDANSETRON HCL 4 MG PO TABS: 4.0000 mg | ORAL_TABLET | Freq: Four times a day (QID) | ORAL | Status: DC | PRN

## 2018-12-15 NOTE — ED Notes (Signed)
Son at bedside.

## 2018-12-15 NOTE — ED Notes (Signed)
CRITICAL VALUE ALERT  Critical Value:  PCO2 70.8  Date & Time Notied:  12/15/2018 1757  Provider Notified: Dr. Wilson Singer   Orders Received/Actions taken: None yet

## 2018-12-15 NOTE — ED Notes (Signed)
CRITICAL VALUE ALERT  Critical Value:  Lactic acid 3.4  Date & Time Notied:  1701, 12/15/18  Provider Notified: Dr. Wilson Singer  Orders Received/Actions taken: no new orders

## 2018-12-15 NOTE — ED Notes (Signed)
Pt tolerating Bipap well

## 2018-12-15 NOTE — ED Triage Notes (Addendum)
Pt brought in by EMS. EMS reports pt fell yesterday 3 times due to weakness.Reports he cant stand up . Hurt right knee with fall. Noted to have 102.6 temp in triage  Sats 87% when EMS arrived O2 at 4 L initiated

## 2018-12-15 NOTE — ED Provider Notes (Signed)
[facility] EMERGENCY DEPARTMENT Provider Note   CSN: [id number] Arrival date & time: 12/15/18  1503     History   Chief Complaint Chief Complaint  Patient presents with   Weakness    HPI JAHZIEL SINN is a 73 y.o. male.     HPI   73 year old male with generalized weakness.  Multiple falls.  He states he is vomited he is feeling weak and losing his balance.  Symptom onset about 2 days ago.  She complains of right knee pain as result of 1 of his falls.  Denies any acute pain elsewhere.  Noted to be febrile here in the emergency room.  He does endorse a cough and some shortness of breath.  No vomiting or diarrhea.  No acute urinary complaints.  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    SOB (shortness of breath)     Patient Active Problem List   Diagnosis Date Noted   Carotid artery disease ([place]) 02/27/2016   Acute hyponatremia 02/12/2015   Hereditary and idiopathic peripheral neuropathy 01/30/2015   Acute on chronic respiratory failure ([place]) 05/08/2014   Anemia, iron deficiency 05/08/2014   Hyponatremia 05/08/2014   Urinary retention 05/08/2014   Chronic respiratory failure with hypoxia ([place]) 04/30/2014   Shortness of breath     Aspiration into airway    HCAP (healthcare-associated pneumonia) 04/24/2014   Hyperlipidemia LDL goal <70 04/20/2014   AAA (abdominal aortic aneurysm), stable 04/20/2014   Abdominal pain, generalized    Chest pain    SOB (shortness of breath)    DOE (dyspnea on exertion), may be Brilinta this was stopped changed to Plavix 04/18/2014   Peripheral vascular disease ([place])    Presence of drug coated stent in left circumflex coronary artery: Promus DES 3.5 mm x 38 mm (3.9 mm) 01/17/2014    Class: Acute   CAD (coronary artery disease), native coronary artery    SVT (supraventricular tachycardia) s/p ablation 01/16/2014 01/16/2014   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   TIA (transient ischemic attack) 09/06/2013   HTN (hypertension) 09/05/2013   TOBACCO ABUSE, hx 08/23/2007   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) 08/12/2007   Reflex sympathetic dystrophy 05/29/2007   GERD 05/29/2007    Past Surgical History:  Procedure Laterality Date   CARDIAC CATHETERIZATION     CATARACT EXTRACTION     CORONARY ANGIOPLASTY     HERNIA REPAIR     LEFT HEART CATHETERIZATION WITH CORONARY ANGIOGRAM N/A 01/17/2014   Procedure: LEFT HEART CATHETERIZATION WITH CORONARY ANGIOGRAM;  Surgeon: Leonie Man, MD;  Location: [facility] C[facility] LAB;  Service: Cardiovascular;  Laterality: N/A;   left shoulder  x 3   NISSEN FUNDOPLICATION     right elbow surgery     x 2   right knee arthroscopy     squamous cell skin cancer     Left Hand   SUPRAVENTRICULAR TACHYCARDIA ABLATION N/A 01/16/2014   Procedure: SUPRAVENTRICULAR TACHYCARDIA ABLATION;  Surgeon: Evans Lance, MD;  Location: [facility] CATH LAB;  Service: Cardiovascular;  Laterality: N/A;        Home Medications    Prior to Admission medications   Medication Sig Start Date End Date Taking? Authorizing Provider  alprazolam [name]) 2 MG tablet Take 1 tablet (2 mg total) by mouth 2 (two) times  daily. 05/02/14   Theodis Blaze, MD  amitriptyline (ELAVIL) 100 MG tablet Take 100 mg by mouth at bedtime.    [provider]  amLODipine (NORVASC) 2.5 MG tablet TAKE 1 TABLET BY MOUTH  DAILY 04/10/17   Croitoru, Mihai, MD  aspirin EC 81 MG tablet Take 1 tablet (81 mg total) by mouth daily. Or Enteric Coated (EC) 81 mg 11/23/14   Barrett, Evelene Croon, PA-C  atorvastatin (LIPITOR) 40 MG tablet Take 1 tablet (40 mg total) by mouth daily at 6 PM. NEED OV. Patient not taking: Reported on 10/01/2017 09/22/17   Minus Breeding, MD  bisacodyl (DULCOLAX) 5 MG EC tablet Take 5 mg by mouth daily as needed for mild constipation or moderate constipation.    [provider]  docusate sodium (COLACE) 100 MG capsule Take 100 mg by mouth daily as needed for mild constipation.    [provider]  finasteride (PROSCAR) 5 MG tablet Take 5 mg by mouth every morning.     [provider]  gabapentin (NEURONTIN) 800 MG tablet Take 1 tablet (800 mg total) by mouth 3 (three) times daily. 08/23/18   Ward Givens, NP  guaiFENesin (MUCINEX) 600 MG 12 hr tablet Take by mouth 2 (two) times daily as needed.    [provider]  latanoprost (XALATAN) 0.005 % ophthalmic solution Place 1 drop into both eyes at bedtime.    [provider]  metoprolol succinate (TOPROL-XL) 25 MG 24 hr tablet Take 12.5 mg by mouth 2 (two) times daily.    [provider]  nitroGLYCERIN (NITROSTAT) 0.4 MG SL tablet Place 1 tablet (0.4 mg total) under the tongue every 5 (five) minutes x 3 doses as needed for chest pain. Patient not taking: Reported on 10/01/2017 04/20/14   [name], NP  tamsulosin (FLOMAX) 0.4 MG CAPS capsule Take 0.4 mg by mouth at bedtime.    [provider]  lamoTRIgine (LAMICTAL) 100 MG tablet Take 1 tablet (100 mg total) by mouth 2 (two) times daily. 01/23/16 01/29/16  Kathrynn Ducking, MD    Family History Family History  Problem Relation Age of Onset    Colon cancer Mother    Colon cancer Maternal Aunt    Colon cancer Maternal Uncle    Heart attack Neg Hx    Stroke Neg Hx     Social History Social History   Tobacco Use   Smoking status: Former Smoker    Packs/day: 2.50    Years: 50.00    Pack years: 125.00    Types: Cigarettes    Quit date: 05/05/2005    Years since quitting: 13.6   Smokeless tobacco: Former Systems developer    Quit date: 05/03/2012  Substance Use Topics   Alcohol use: No    Alcohol/week: 0.0 standard drinks    Comment: last use  2013   Drug use: No     Allergies   Codeine and Trileptal [oxcarbazepine]   Review of Systems Review of Systems  All systems reviewed and negative, other than as noted in HPI.  Physical Exam Updated Vital Signs BP (!) 156/87    Pulse (!) 101    Temp (!) 102.6 F (39.2 C) (Oral)    Resp (!) 23    Ht 6' (1.829 m)    Wt 98 kg    SpO2 97%    BMI 29.29 kg/m   Physical Exam Vitals signs and nursing note reviewed.  Constitutional:      Appearance: He is well-developed.     Comments: Appears tired but nontoxic.  HENT:     Head: Normocephalic and atraumatic.  Eyes:     General:        Right eye: No discharge.        Left eye: No discharge.     Conjunctiva/sclera: Conjunctivae normal.  Neck:     Musculoskeletal: Neck supple.  Cardiovascular:     Rate and Rhythm: Normal rate and regular rhythm.     Heart sounds: Normal heart sounds. No murmur. No friction rub. No gallop.   Pulmonary:     Effort: Pulmonary effort is normal. No respiratory distress.     Breath sounds: Normal breath sounds.  Abdominal:     General: There is no distension.     Palpations: Abdomen is soft.     Tenderness: There is no abdominal tenderness.  Musculoskeletal:        General: Tenderness and signs of injury present.     Comments: Right knee tender to palpation, particularly medially.  Small effusion.  Increased pain with range of motion.  Closed injury.  Neurovascularly intact.  Skin:    General:  Skin is warm and dry.  Neurological:     Mental Status: He is alert.  Psychiatric:        Behavior: Behavior normal.        Thought Content: Thought content normal.      ED Treatments / Results  Labs (all labs ordered are listed, but only abnormal results are displayed) Labs Reviewed  LACTIC ACID, PLASMA - Abnormal; Notable for the following components:      Result Value   Lactic Acid, Venous 3.4 (*)    All other components within normal limits  LACTIC ACID, PLASMA - Abnormal; Notable for the following components:   Lactic Acid, Venous 3.5 (*)    All other components within normal limits  COMPREHENSIVE METABOLIC PANEL - Abnormal; Notable for the following components:   Sodium 134 (*)    Potassium 5.3 (*)    Chloride 94 (*)    Glucose, Bld 138 (*)    BUN 24 (*)    Creatinine, Ser 2.05 (*)    GFR calc non Af Amer 31 (*)    GFR calc Af Amer 36 (*)    All other components within normal limits  CBC WITH DIFFERENTIAL/PLATELET - Abnormal; Notable for the following components:   WBC 15.0 (*)    MCHC 29.7 (*)    RDW 19.9 (*)    nRBC 0.7 (*)    Neutro Abs 12.4 (*)    Monocytes Absolute 1.2 (*)    Abs Immature Granulocytes 0.17 (*)    All other components within normal limits  URINALYSIS, ROUTINE W REFLEX MICROSCOPIC - Abnormal; Notable for the following components:   Color, Urine AMBER (*)    APPearance  HAZY (*)    Hgb urine dipstick SMALL (*)    Protein, ur 30 (*)    All other components within normal limits  BLOOD GAS, ARTERIAL - Abnormal; Notable for the following components:   pH, Arterial 7.247 (*)    pCO2 arterial 70.8 (*)    Acid-Base Excess 3.1 (*)    All other components within normal limits  COMPREHENSIVE METABOLIC PANEL - Abnormal; Notable for the following components:   Sodium 132 (*)    Potassium 5.6 (*)    Glucose, Bld 141 (*)    BUN 29 (*)    Creatinine, Ser 1.90 (*)    Calcium 7.9 (*)    Albumin 2.7 (*)    AST 13 (*)    GFR calc non Af Amer 34 (*)     GFR calc Af Amer 39 (*)    All other components within normal limits  CBC - Abnormal; Notable for the following components:   WBC 11.6 (*)    MCHC 29.5 (*)    RDW 19.5 (*)    nRBC 0.3 (*)    All other components within normal limits  CBC - Abnormal; Notable for the following components:   Hemoglobin 12.8 (*)    MCHC 29.8 (*)    RDW 18.9 (*)    All other components within normal limits  BASIC METABOLIC PANEL - Abnormal; Notable for the following components:   Sodium 133 (*)    Glucose, Bld 145 (*)    BUN 27 (*)    Calcium 8.4 (*)    All other components within normal limits  CBC - Abnormal; Notable for the following components:   Hemoglobin 12.8 (*)    RDW 18.9 (*)    All other components within normal limits  BASIC METABOLIC PANEL - Abnormal; Notable for the following components:   Glucose, Bld 125 (*)    BUN 28 (*)    Calcium 8.6 (*)    All other components within normal limits  BASIC METABOLIC PANEL - Abnormal; Notable for the following components:   Glucose, Bld 131 (*)    BUN 30 (*)    All other components within normal limits  CBC - Abnormal; Notable for the following components:   RDW 19.2 (*)    All other components within normal limits  CULTURE, BLOOD (ROUTINE X 2)  CULTURE, BLOOD (ROUTINE X 2)  SARS CORONAVIRUS 2 (HOSPITAL ORDER, [facility])  MRSA PCR SCREENING  LACTIC ACID, PLASMA  LACTIC ACID, PLASMA    EKG EKG Interpretation  Date/Time:  Wednesday December 15 2018 15:41:45 EDT Ventricular Rate:  99 PR Interval:    QRS Duration: 145 QT Interval:  350 QTC Calculation: 450 R Axis:   88 Text Interpretation:  Sinus rhythm Probable left atrial enlargement Right bundle branch block Confirmed by Virgel Manifold [phone]) on 12/15/2018 4:35:41 PM   Radiology No results found.   Dg Chest Port 1 View  Result Date: 12/15/2018 CLINICAL DATA:  Fever, weakness EXAM: PORTABLE CHEST 1 VIEW COMPARISON:  08/20/2017 chest radiograph. FINDINGS:  Low lung volumes. Stable cardiomediastinal silhouette with moderate cardiomegaly. No pneumothorax. Calcified bilateral pleural plaques and pleural thickening, unchanged. No convincing pleural effusion. Patchy consolidation at both lung bases superimposed on chronic reticular opacities. No overt pulmonary edema. IMPRESSION: 1. Patchy consolidation superimposed on chronic reticular opacities at the lung bases, cannot exclude aspiration or pneumonia. Chest radiograph follow-up advised. 2. 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  3. Moderate cardiomegaly without overt pulmonary edema. Electronically Signed   By: Ilona Sorrel M.D.   On: 12/15/2018 18:08   Dg Knee Complete 4 Views Right  Result Date: 12/15/2018 CLINICAL DATA:  Fall, right knee pain when moving EXAM: RIGHT KNEE - COMPLETE 4+ VIEW COMPARISON:  None. FINDINGS: No fracture or dislocation. Small suprapatellar right knee joint effusion. No suspicious focal osseous lesion. Small superior right patellar enthesophyte. No significant degenerative arthropathy. Posterior vascular calcifications. No radiopaque foreign body. IMPRESSION: Small suprapatellar right knee joint effusion, with no right knee fracture or dislocation. Electronically Signed   By: Ilona Sorrel M.D.   On: 12/15/2018 18:05    Procedures Procedures (including critical care time)  Medications Ordered in ED Medications  vancomycin (VANCOCIN) IVPB 1000 mg/200 mL premix (has no administration in time range)    Followed by  vancomycin (VANCOCIN) IVPB 1000 mg/200 mL premix (has no administration in time range)  ceFEPIme (MAXIPIME) 2 g in sodium chloride 0.9 % 100 mL IVPB (has no administration in time range)  fentaNYL (SUBLIMAZE) injection 50 mcg (has no administration in time range)  sodium chloride 0.9 % bolus 1,000 mL (has no administration in time range)  0.9 %  sodium chloride infusion (has no administration in time range)    sodium chloride flush (NS) 0.9 % injection 3 mL (3 mLs Intravenous Given 12/15/18 1551)  acetaminophen (TYLENOL) tablet 650 mg (650 mg Oral Given 12/15/18 1551)     Initial Impression / Assessment and Plan / ED Course  I have reviewed the triage vital signs and the nursing notes.  Pertinent labs & imaging results that were available during my care of the patient were reviewed by me and considered in my medical decision making (see chart for details).    73yM with decreased mental status. Likely from pneumonia. abx ordered. COVID negative. ABG noted. Placed on BiPAP. Admit.    5:05 PM Called to bedside by nursing. Pt desatted to 80% and much more drowsy than initial presented. Barely opens eyes with sternal rub and non-verbal. Received 50 mcg of fentanyl for R knee pain. Given 0.4mg  of narcan and brisk clinical response. Still drowsy but awakens to voice and responding to questions.   [name] was evaluated in Emergency Department on 12/24/2018 for the symptoms described in the history of present illness. He was evaluated in the context of the global COVID-19 pandemic, which necessitated consideration that the patient might be at risk for infection with the SARS-CoV-2 virus that causes COVID-19. Institutional protocols and algorithms that pertain to the evaluation of patients at risk for COVID-19 are in a state of rapid change based on information released by regulatory bodies including the CDC and federal and state organizations. These policies and algorithms were followed during the patient's care in the ED.   Final Clinical Impressions(s) / ED Diagnoses   Final diagnoses:  Fever    ED Discharge Orders    None       Virgel Manifold, MD 12/24/18 1455

## 2018-12-15 NOTE — H&P (Signed)
History and Physical    Curtis [name] ERX:[id number] DOB: 1946-02-14 DOA: 12/15/2018  I have briefly reviewed the patient's prior medical records in [place]  PCP: [name], MD  Patient coming from: home  Chief Complaint: dyspnea, weakness   HPI: Curtis [name] is a 73 y.o. male with medical history significant of COPD, pulmonary asbestosis, peripheral vascular disease, arthritis, hypertension, coronary artery disease, who comes to the hospital with chief complaint of weakness and shortness of breath.  Patient's is on BiPAP and story is mostly contributed by son.  He tells me that his dad called him yesterday that he fell on the floor and was too weak to get up, and that happened on 2 separate occasions in the morning as well as at lunchtime.  Patient stated that he fell on his right knee.  Today, patient called his son telling him that he cannot breathe.  When son arrived, the patient was having difficulties breathing and son noticed that his lips were blue and decided to call 911.  Patient tells me that his breathing is improved since he has been on BiPAP.  Currently denies any chest pain, denies any abdominal pain, denies any nausea or vomiting.  Denies any sore throat, but did have chest congestion patient himself thought he has pneumonia.  ED Course: In the ED patient was found to be febrile with a temp of 102.6, initially tachycardic but improved with fluids.  He was hypoxic requiring 4 L nasal cannula.  Blood work revealed a creatinine of 2.0 from prior normal levels, potassium 5.3, lactic acid was 3.4.  He has a white count of 15. His chest x-ray showed probable bilateral pneumonia at the bases.  An ABG showed respiratory acidosis with hypercarbia.  He was placed on BiPAP and we are asked to admit.  His Covid was negative  Review of Systems: As per HPI otherwise 10 point review of systems negative.   Past Medical History:  Diagnosis Date  . AAA (abdominal aortic  aneurysm), stable 04/20/2014  . Arthritis   . 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 Cancer ([place])   . Depression   . Diverticulitis    hospital 2011 [facility]  . Diverticulosis   . GERD (gastroesophageal reflux disease)   . 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 Hernia of unspecified site of abdominal cavity without mention of obstruction or gangrene    hiatal  . Hyperlipidemia LDL goal <70 04/20/2014  . Hypertension   . IBS (irritable bowel syndrome)   . Myocardial infarction ([place]) 01/2014   NSTEMI  . Peripheral vascular disease ([facility])    ankle brachial index of 0.79 on the right and 0.61 on the left.   . 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[place])   . Reflex sympathetic dystrophy   . 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 SOB (shortness of breath)     Past Surgical History:  Procedure Laterality Date  . CARDIAC CATHETERIZATION    . CATARACT EXTRACTION    . CORONARY ANGIOPLASTY    . HERNIA REPAIR    . LEFT HEART CATHETERIZATION WITH CORONARY ANGIOGRAM N/A 01/17/2014   Procedure: LEFT HEART CATHETERIZATION WITH CORONARY ANGIOGRAM;  Surgeon: Leonie Man, MD;  Location: [facility] CATH LAB;  Service: Cardiovascular;  Laterality: N/A;  . left shoulder      x 3  . NISSEN FUNDOPLICATION    . right elbow surgery     x 2  . right knee arthroscopy    . squamous  cell skin cancer     Left Hand  . SUPRAVENTRICULAR TACHYCARDIA ABLATION N/A 01/16/2014   Procedure: SUPRAVENTRICULAR TACHYCARDIA ABLATION;  Surgeon: Evans Lance, MD;  Location: [facility] CATH LAB;  Service: Cardiovascular;  Laterality: N/A;     reports that he quit smoking about 13 years ago. His smoking use included cigarettes. He has a 125.00 pack-year smoking history. He quit smokeless tobacco use about 6 years ago. He reports that he does not drink alcohol or use drugs.  Allergies  Allergen Reactions  . Codeine Itching  . Trileptal [Oxcarbazepine]     Other reaction(s): Other (See Comments) Abnormal sodium levels Hyponatremia    Family History   Problem Relation Age of Onset  . Colon cancer Mother   . Colon cancer Maternal Aunt   . Colon cancer Maternal Uncle   . Heart attack Neg Hx   . Stroke Neg Hx     Prior to Admission medications   Medication Sig Start Date End Date Taking? Authorizing Provider  alprazolam [name]) 2 MG tablet Take 1 tablet (2 mg total) by mouth 2 (two) times daily. 05/02/14   Theodis Blaze, MD  amitriptyline (ELAVIL) 100 MG tablet Take 100 mg by mouth at bedtime.    [provider]  amLODipine (NORVASC) 2.5 MG tablet TAKE 1 TABLET BY MOUTH  DAILY 04/10/17   Croitoru, Mihai, MD  aspirin EC 81 MG tablet Take 1 tablet (81 mg total) by mouth daily. Or Enteric Coated (EC) 81 mg 11/23/14   Barrett, Evelene Croon, PA-C  atorvastatin (LIPITOR) 40 MG tablet Take 1 tablet (40 mg total) by mouth daily at 6 PM. NEED OV. Patient not taking: Reported on 10/01/2017 09/22/17   Minus Breeding, MD  bisacodyl (DULCOLAX) 5 MG EC tablet Take 5 mg by mouth daily as needed for mild constipation or moderate constipation.    [provider]  docusate sodium (COLACE) 100 MG capsule Take 100 mg by mouth daily as needed for mild constipation.    [provider]  finasteride (PROSCAR) 5 MG tablet Take 5 mg by mouth every morning.     [provider]  gabapentin (NEURONTIN) 800 MG tablet Take 1 tablet (800 mg total) by mouth 3 (three) times daily. 08/23/18   Ward Givens, NP  guaiFENesin (MUCINEX) 600 MG 12 hr tablet Take by mouth 2 (two) times daily as needed.    [provider]  latanoprost (XALATAN) 0.005 % ophthalmic solution Place 1 drop into both eyes at bedtime.    [provider]  metoprolol succinate (TOPROL-XL) 25 MG 24 hr tablet Take 12.5 mg by mouth 2 (two) times daily.    [provider]  nitroGLYCERIN (NITROSTAT) 0.4 MG SL tablet Place 1 tablet (0.4 mg total) under the tongue every 5 (five) minutes x 3 doses as needed for chest pain. Patient not taking: Reported on  10/01/2017 04/20/14   [name], NP  tamsulosin (FLOMAX) 0.4 MG CAPS capsule Take 0.4 mg by mouth at bedtime.    [provider]  lamoTRIgine (LAMICTAL) 100 MG tablet Take 1 tablet (100 mg total) by mouth 2 (two) times daily. 01/23/16 01/29/16  Kathrynn Ducking, MD    Physical Exam: Vitals:   12/15/18 1800 12/15/18 1823 12/15/18 1824 12/15/18 1830  BP: 134/70   130/76  Pulse: 89  (!) 105 87  Resp: 15   15  Temp:      TempSrc:      SpO2: 94% 93% 92% 93%  Weight:      Height:        Constitutional: Alert, appears comfortable on BiPAP Eyes: PERRL, lids and conjunctivae normal ENMT: Mucous membranes are moist.  Neck: normal, supple Respiratory: No wheezing, overall diminished breath sounds, no crackles.  Tachypneic Cardiovascular: Regular rate and rhythm, no murmurs / rubs / gallops. No extremity edema. 2+ pedal pulses.  Abdomen: no tenderness, no masses palpated. Bowel sounds positive.  Musculoskeletal: no clubbing / cyanosis. Normal muscle tone.  Skin: no rashes, lesions, ulcers. No induration Neurologic: CN 2-12 grossly intact. Strength 5/5 in all 4.  Psychiatric: Normal judgment and insight. Alert and oriented x 3. Normal mood.   Labs on Admission: I have personally reviewed following labs and imaging studies  CBC: Recent Labs  Lab 12/15/18 1551  WBC 15.0*  NEUTROABS 12.4*  HGB 14.9  HCT 50.2  MCV 92.8  PLT 086   Basic Metabolic Panel: Recent Labs  Lab 12/15/18 1551  NA 134*  K 5.3*  CL 94*  CO2 27  GLUCOSE 138*  BUN 24*  CREATININE 2.05*  CALCIUM 9.1   GFR: Estimated Creatinine Clearance: 38.4 mL/min (A) (by C-G formula based on SCr of 2.05 mg/dL (H)). Liver Function Tests: Recent Labs  Lab 12/15/18 1551  AST 20  ALT 11  ALKPHOS 80  BILITOT 0.7  PROT 8.1  ALBUMIN 3.5   No results for input(s): LIPASE, AMYLASE in the last 168 hours. No results for input(s): AMMONIA in the last 168 hours. Coagulation Profile: No results for input(s):  INR, PROTIME in the last 168 hours. Cardiac Enzymes: No results for input(s): CKTOTAL, CKMB, CKMBINDEX, TROPONINI in the last 168 hours. BNP (last 3 results) No results for input(s): PROBNP in the last 8760 hours. HbA1C: No results for input(s): HGBA1C in the last 72 hours. CBG: No results for input(s): GLUCAP in the last 168 hours. Lipid Profile: No results for input(s): CHOL, HDL, LDLCALC, TRIG, CHOLHDL, LDLDIRECT in the last 72 hours. Thyroid Function Tests: No results for input(s): TSH, T4TOTAL, FREET4, T3FREE, THYROIDAB in the last 72 hours. Anemia Panel: No results for input(s): VITAMINB12, FOLATE, FERRITIN, TIBC, IRON, RETICCTPCT in the last 72 hours. Urine analysis:    Component Value Date/Time   COLORURINE YELLOW 02/12/2015 1859   APPEARANCEUR CLEAR 02/12/2015 1859   LABSPEC 1.012 02/12/2015 1859   PHURINE 7.0 02/12/2015 1859   GLUCOSEU NEGATIVE 02/12/2015 1859   HGBUR NEGATIVE 02/12/2015 1859   BILIRUBINUR NEGATIVE 02/12/2015 1859   KETONESUR NEGATIVE 02/12/2015 1859   PROTEINUR NEGATIVE 02/12/2015 1859   UROBILINOGEN 0.2 02/12/2015 1859   NITRITE NEGATIVE 02/12/2015 1859   LEUKOCYTESUR NEGATIVE 02/12/2015 1859     Radiological Exams on Admission: Dg Chest Port 1 View  Result Date: 12/15/2018 CLINICAL DATA:  Fever, weakness EXAM: PORTABLE CHEST 1 VIEW COMPARISON:  08/20/2017 chest radiograph. FINDINGS: Low lung volumes. Stable cardiomediastinal silhouette with moderate cardiomegaly. No pneumothorax. Calcified bilateral pleural plaques and pleural thickening, unchanged. No convincing pleural effusion. Patchy consolidation at both lung bases superimposed on chronic reticular opacities. No overt pulmonary edema. IMPRESSION: 1. Patchy consolidation superimposed on chronic reticular opacities at the lung bases, cannot exclude aspiration or pneumonia. Chest radiograph follow-up advised. 2. 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 3. Moderate cardiomegaly without overt pulmonary edema. Electronically Signed   By: Ilona Sorrel M.D.   On: 12/15/2018 18:08   Dg Knee Complete 4 Views Right  Result Date: 12/15/2018 CLINICAL DATA:  Fall, right knee pain when  moving EXAM: RIGHT KNEE - COMPLETE 4+ VIEW COMPARISON:  None. FINDINGS: No fracture or dislocation. Small suprapatellar right knee joint effusion. No suspicious focal osseous lesion. Small superior right patellar enthesophyte. No significant degenerative arthropathy. Posterior vascular calcifications. No radiopaque foreign body. IMPRESSION: Small suprapatellar right knee joint effusion, with no right knee fracture or dislocation. Electronically Signed   By: Ilona Sorrel M.D.   On: 12/15/2018 18:05    EKG: Independently reviewed.  Sinus rhythm, right bundle branch block  Assessment/Plan Active Problems:   Sepsis ([place])   Principal Problem Sepsis due to CAP -Patient's clinical picture consistent with community-acquired pneumonia, he has infiltrates and is febrile and has leukocytosis.  Start patient on ceftriaxone and azithromycin, cultures were sent  Active Problems Acute hypoxic and hypercarbic respiratory failure with underlying COPD -Patient without wheezing, hold off on steroids.  He was placed on BiPAP for hypercarbic respiratory failure and will admit to the ICU.  He is quite adamant about no intubation, this was discussed in detail with the patient and son was at bedside.  He would not want to be intubated even for couple of days if needed  Acute kidney injury -Prior creatinine were normal, he did report poor p.o. intake over the last several days, suspect prerenal.  Will provide IV fluids overnight and repeat renal function in the morning.  Hypertension -Hold home medications for now in the setting of sepsis  Depression/anxiety -Continue amitriptyline, hold Xanax  Coronary artery disease -Continue aspirin, Lipitor  BPH -Continue finasteride,  tamsulosin   DVT prophylaxis: Lovenox  Code Status: Partial, no intubation  Family Communication: son present at bedside  Disposition Plan: ICU Consults called: none     Marzetta Board, MD, PhD [facility]  Contact via [email or web address]  [facility] Office Info P: [phone] F: [phone]   12/15/2018, 6:41 PM

## 2018-12-15 NOTE — ED Notes (Signed)
Have notified respiratory.  

## 2018-12-15 NOTE — ED Notes (Signed)
CRITICAL VALUE ALERT  Critical Value:  Lactic Acid 3.5  Date & Time Notied:  12/15/2018 1858  Provider Notified: Dr. Cruzita Lederer  Orders Received/Actions taken: None yet

## 2018-12-15 NOTE — ED Notes (Signed)
Pt has been sleeping but is arousable with stimulation.

## 2018-12-15 NOTE — ED Notes (Signed)
PT became responsive and breathing more adequately after narcan administration.

## 2018-12-15 NOTE — ED Notes (Signed)
PT noted to have decreased oxygen saturation and was lethargic. EDP made aware and gave order to give Narcan.

## 2018-12-15 NOTE — ED Notes (Signed)
Son has left but would like to be notified with any updates.

## 2018-12-16 ENCOUNTER — Encounter (HOSPITAL_COMMUNITY): Payer: Self-pay

## 2018-12-16 DIAGNOSIS — F329 Major depressive disorder, single episode, unspecified: Secondary | ICD-10-CM | POA: Diagnosis present

## 2018-12-16 DIAGNOSIS — A419 Sepsis, unspecified organism: Secondary | ICD-10-CM | POA: Diagnosis present

## 2018-12-16 DIAGNOSIS — R6520 Severe sepsis without septic shock: Secondary | ICD-10-CM | POA: Diagnosis not present

## 2018-12-16 DIAGNOSIS — I739 Peripheral vascular disease, unspecified: Secondary | ICD-10-CM | POA: Diagnosis present

## 2018-12-16 DIAGNOSIS — W1830XA Fall on same level, unspecified, initial encounter: Secondary | ICD-10-CM | POA: Diagnosis present

## 2018-12-16 DIAGNOSIS — I10 Essential (primary) hypertension: Secondary | ICD-10-CM | POA: Diagnosis present

## 2018-12-16 DIAGNOSIS — K219 Gastro-esophageal reflux disease without esophagitis: Secondary | ICD-10-CM | POA: Diagnosis present

## 2018-12-16 DIAGNOSIS — J9601 Acute respiratory failure with hypoxia: Secondary | ICD-10-CM | POA: Diagnosis present

## 2018-12-16 DIAGNOSIS — I119 Hypertensive heart disease without heart failure: Secondary | ICD-10-CM | POA: Diagnosis present

## 2018-12-16 DIAGNOSIS — R296 Repeated falls: Secondary | ICD-10-CM | POA: Diagnosis present

## 2018-12-16 DIAGNOSIS — R509 Fever, unspecified: Secondary | ICD-10-CM | POA: Diagnosis present

## 2018-12-16 DIAGNOSIS — N401 Enlarged prostate with lower urinary tract symptoms: Secondary | ICD-10-CM | POA: Diagnosis present

## 2018-12-16 DIAGNOSIS — K589 Irritable bowel syndrome without diarrhea: Secondary | ICD-10-CM | POA: Diagnosis present

## 2018-12-16 DIAGNOSIS — G9050 Complex regional pain syndrome I, unspecified: Secondary | ICD-10-CM | POA: Diagnosis present

## 2018-12-16 DIAGNOSIS — E875 Hyperkalemia: Secondary | ICD-10-CM | POA: Diagnosis present

## 2018-12-16 DIAGNOSIS — J440 Chronic obstructive pulmonary disease with acute lower respiratory infection: Secondary | ICD-10-CM | POA: Diagnosis present

## 2018-12-16 DIAGNOSIS — E785 Hyperlipidemia, unspecified: Secondary | ICD-10-CM | POA: Diagnosis present

## 2018-12-16 DIAGNOSIS — I2510 Atherosclerotic heart disease of native coronary artery without angina pectoris: Secondary | ICD-10-CM | POA: Diagnosis present

## 2018-12-16 DIAGNOSIS — R338 Other retention of urine: Secondary | ICD-10-CM | POA: Diagnosis present

## 2018-12-16 DIAGNOSIS — N179 Acute kidney failure, unspecified: Secondary | ICD-10-CM | POA: Diagnosis present

## 2018-12-16 DIAGNOSIS — J189 Pneumonia, unspecified organism: Secondary | ICD-10-CM | POA: Diagnosis present

## 2018-12-16 DIAGNOSIS — F419 Anxiety disorder, unspecified: Secondary | ICD-10-CM | POA: Diagnosis present

## 2018-12-16 DIAGNOSIS — E872 Acidosis: Secondary | ICD-10-CM | POA: Diagnosis present

## 2018-12-16 DIAGNOSIS — Z20828 Contact with and (suspected) exposure to other viral communicable diseases: Secondary | ICD-10-CM | POA: Diagnosis present

## 2018-12-16 DIAGNOSIS — J61 Pneumoconiosis due to asbestos and other mineral fibers: Secondary | ICD-10-CM | POA: Diagnosis present

## 2018-12-16 DIAGNOSIS — J9602 Acute respiratory failure with hypercapnia: Secondary | ICD-10-CM | POA: Diagnosis present

## 2018-12-16 DIAGNOSIS — I252 Old myocardial infarction: Secondary | ICD-10-CM | POA: Diagnosis not present

## 2018-12-16 LAB — COMPREHENSIVE METABOLIC PANEL
ALT: 10 U/L (ref 0–44)
AST: 13 U/L — ABNORMAL LOW (ref 15–41)
Albumin: 2.7 g/dL — ABNORMAL LOW (ref 3.5–5.0)
Alkaline Phosphatase: 69 U/L (ref 38–126)
Anion gap: 6 (ref 5–15)
BUN: 29 mg/dL — ABNORMAL HIGH (ref 8–23)
CO2: 27 mmol/L (ref 22–32)
Calcium: 7.9 mg/dL — ABNORMAL LOW (ref 8.9–10.3)
Chloride: 99 mmol/L (ref 98–111)
Creatinine, Ser: 1.9 mg/dL — ABNORMAL HIGH (ref 0.61–1.24)
GFR calc Af Amer: 39 mL/min — ABNORMAL LOW (ref >60)
GFR calc non Af Amer: 34 mL/min — ABNORMAL LOW (ref >60)
Glucose, Bld: 141 mg/dL — ABNORMAL HIGH (ref 70–99)
Potassium: 5.6 mmol/L — ABNORMAL HIGH (ref 3.5–5.1)
Sodium: 132 mmol/L — ABNORMAL LOW (ref 135–145)
Total Bilirubin: 0.5 mg/dL (ref 0.3–1.2)
Total Protein: 6.7 g/dL (ref 6.5–8.1)

## 2018-12-16 LAB — URINALYSIS, ROUTINE W REFLEX MICROSCOPIC
Bacteria, UA: NONE SEEN
Bilirubin Urine: NEGATIVE
Glucose, UA: NEGATIVE mg/dL
Ketones, ur: NEGATIVE mg/dL
Leukocytes,Ua: NEGATIVE
Nitrite: NEGATIVE
Protein, ur: 30 mg/dL — AB
Specific Gravity, Urine: 1.026 (ref 1.005–1.030)
pH: 5 (ref 5.0–8.0)

## 2018-12-16 LAB — CBC
HCT: 44.7 % (ref 39.0–52.0)
Hemoglobin: 13.2 g/dL (ref 13.0–17.0)
MCH: 27.7 pg (ref 26.0–34.0)
MCHC: 29.5 g/dL — ABNORMAL LOW (ref 30.0–36.0)
MCV: 93.9 fL (ref 80.0–100.0)
Platelets: 199 10*3/uL (ref 150–400)
RBC: 4.76 MIL/uL (ref 4.22–5.81)
RDW: 19.5 % — ABNORMAL HIGH (ref 11.5–15.5)
WBC: 11.6 10*3/uL — ABNORMAL HIGH (ref 4.0–10.5)
nRBC: 0.3 % — ABNORMAL HIGH (ref 0.0–0.2)

## 2018-12-16 LAB — MRSA PCR SCREENING: MRSA by PCR: NEGATIVE

## 2018-12-16 MED ORDER — GABAPENTIN 400 MG PO CAPS
800.0000 mg | ORAL_CAPSULE | Freq: Three times a day (TID) | ORAL | Status: DC
  Administered 2018-12-16 – 2018-12-19 (×10): 800 mg via ORAL
  Filled 2018-12-16 (×10): qty 2

## 2018-12-16 MED ORDER — SODIUM ZIRCONIUM CYCLOSILICATE 10 G PO PACK
10.0000 g | PACK | Freq: Once | ORAL | Status: AC
  Administered 2018-12-16: 10 g via ORAL
  Filled 2018-12-16 (×2): qty 1

## 2018-12-16 MED ORDER — METHYLPREDNISOLONE SODIUM SUCC 40 MG IJ SOLR
40.0000 mg | Freq: Two times a day (BID) | INTRAMUSCULAR | Status: DC
  Administered 2018-12-16 – 2018-12-19 (×7): 40 mg via INTRAVENOUS
  Filled 2018-12-16 (×7): qty 1

## 2018-12-16 MED ORDER — HEPARIN SODIUM (PORCINE) 5000 UNIT/ML IJ SOLN
5000.0000 [IU] | Freq: Three times a day (TID) | INTRAMUSCULAR | Status: DC
  Administered 2018-12-16 – 2018-12-19 (×8): 5000 [IU] via SUBCUTANEOUS
  Filled 2018-12-16 (×7): qty 1

## 2018-12-16 MED ORDER — AMITRIPTYLINE HCL 25 MG PO TABS
100.0000 mg | ORAL_TABLET | Freq: Every day | ORAL | Status: DC
  Administered 2018-12-16 – 2018-12-18 (×3): 100 mg via ORAL
  Filled 2018-12-16 (×3): qty 4

## 2018-12-16 MED ORDER — GUAIFENESIN ER 600 MG PO TB12
600.0000 mg | ORAL_TABLET | Freq: Two times a day (BID) | ORAL | Status: DC
  Administered 2018-12-16 – 2018-12-19 (×7): 600 mg via ORAL
  Filled 2018-12-16 (×7): qty 1

## 2018-12-16 MED ORDER — LATANOPROST 0.005 % OP SOLN
1.0000 [drp] | Freq: Every day | OPHTHALMIC | Status: DC
  Administered 2018-12-16 – 2018-12-18 (×3): 1 [drp] via OPHTHALMIC
  Filled 2018-12-16: qty 2.5

## 2018-12-16 NOTE — Consult Note (Addendum)
This is a brief consult note with full note to follow.  Patient seen and examined.  He is a 73 year old with COPD pulmonary asbestosis coronary disease and who came to the hospital with weakness and shortness of breath.  He had a temperature of 102 and he was tachycardic but improved with fluids.  Lactate was 3.4.  His chest x-ray shows bilateral pneumonia.  He is on BiPAP and feels better.  He has pneumonia.  He is on appropriate treatment.  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  He is on appropriate treatment.  I discussed with Dr. Manuella Ghazi and agree with the addition of steroids  He is high risk for deterioration and need for intubation and mechanical ventilation

## 2018-12-16 NOTE — ED Notes (Signed)
Pt stated he was ok and just wanted to sleep

## 2018-12-16 NOTE — Progress Notes (Signed)
PROGRESS NOTE    Curtis [name]  DHR:[id number] DOB: 10-04-1945 DOA: 12/15/2018 PCP: [name], MD   Brief Narrative:  Per HPI: Curtis [name] is a 73 y.o. male with medical history significant of COPD, pulmonary asbestosis, peripheral vascular disease, arthritis, hypertension, coronary artery disease, who comes to the hospital with chief complaint of weakness and shortness of breath.  Patient's is on BiPAP and story is mostly contributed by son.  He tells me that his dad called him yesterday that he fell on the floor and was too weak to get up, and that happened on 2 separate occasions in the morning as well as at lunchtime.  Patient stated that he fell on his right knee.  Today, patient called his son telling him that he cannot breathe.  When son arrived, the patient was having difficulties breathing and son noticed that his lips were blue and decided to call 911.  Patient tells me that his breathing is improved since he has been on BiPAP.  Currently denies any chest pain, denies any abdominal pain, denies any nausea or vomiting.  Denies any sore throat, but did have chest congestion patient himself thought he has pneumonia.  Patient was admitted with sepsis secondary to community-acquired pneumonia and has been started on Rocephin and azithromycin.  Discussed with pulmonology and will add IV steroids at this point to and maintain on breathing treatments and wean off BiPAP as tolerated.  He is also noted to have AKI with some mild hyperkalemia.  Assessment & Plan:   Active Problems:   Sepsis ([place])   Acute combined respiratory failure secondary to sepsis from CAP with underlying COPD/asbestosis -Continue on BiPAP support and wean as tolerated -Appreciate pulmonology recommendations -Maintain on breathing treatments and add IV steroids with methylprednisolone -Maintain on azithromycin and Rocephin with cultures pending  AKI with hyperkalemia -Improving with fluid administration and  will continue for now -1 dose of Lokelma for hyperkalemia -Avoid nephrotoxic agents  -Repeat renal panel in a.m.  Hypertension-stable -Continue to hold home medications for now and resume once elevated blood pressures noted  Depression/anxiety -Continue amitriptyline and hold Xanax for now until off BiPAP  CAD -Continue aspirin and Lipitor  BPH -Continue finasteride and tamsulosin   DVT prophylaxis: Lovenox to heparin for AKI Code Status: DNI Family Communication: We will call son for update Disposition Plan: Observe in ICU and appreciate further pulmonology recommendations.  Maintain on azithromycin and Rocephin empirically for now.   Consultants:   Pulmonology  Procedures:   None  Antimicrobials:  Anti-infectives (From admission, onward)   Start     Dose/Rate Route Frequency Ordered Stop   12/15/18 2000  azithromycin (ZITHROMAX) 500 mg in sodium chloride 0.9 % 250 mL IVPB     500 mg 250 mL/hr over 60 Minutes Intravenous Every 24 hours 12/15/18 1848     12/15/18 1930  cefTRIAXone (ROCEPHIN) 1 g in sodium chloride 0.9 % 100 mL IVPB     1 g 200 mL/hr over 30 Minutes Intravenous Every 24 hours 12/15/18 1847     12/15/18 1700  vancomycin (VANCOCIN) IVPB 1000 mg/200 mL premix     1,000 mg 200 mL/hr over 60 Minutes Intravenous  Once 12/15/18 1552 12/15/18 1841   12/15/18 1600  vancomycin (VANCOCIN) IVPB 1000 mg/200 mL premix     1,000 mg 200 mL/hr over 60 Minutes Intravenous  Once 12/15/18 1552 12/15/18 1740   12/15/18 1600  ceFEPIme (MAXIPIME) 2 g in sodium chloride 0.9 % 100 mL  IVPB     2 g 200 mL/hr over 30 Minutes Intravenous  Once 12/15/18 1556 12/15/18 1734   12/15/18 1530  ceFEPIme (MAXIPIME) 1 g in sodium chloride 0.9 % 100 mL IVPB  Status:  Discontinued     1 g 200 mL/hr over 30 Minutes Intravenous  Once 12/15/18 1527 12/15/18 1557   12/15/18 1530  vancomycin (VANCOCIN) 2,000 mg in sodium chloride 0.9 % 500 mL IVPB  Status:  Discontinued     2,000 mg 250  mL/hr over 120 Minutes Intravenous  Once 12/15/18 1527 12/15/18 1552       Subjective: Patient seen and evaluated today with no new acute complaints or concerns. No acute concerns or events noted overnight.  He remains on BiPAP and appears comfortable this morning.  Denies any chest pain or tightness.  Objective: Vitals:   12/16/18 0830 12/16/18 1040 12/16/18 1045 12/16/18 1100  BP: (!) 120/56  (!) 156/75 119/74  Pulse:    86  Resp:   17 17  Temp:   (!) 97.5 F (36.4 C)   TempSrc:   Oral   SpO2:   (!) 89% 90%  Weight:  94.4 kg    Height:  6' (1.829 m)      Intake/Output Summary (Last 24 hours) at 12/16/2018 1120 Last data filed at 12/16/2018 0403 Gross per 24 hour  Intake 1445.84 ml  Output --  Net 1445.84 ml   Filed Weights   12/15/18 1518 12/16/18 1040  Weight: 98 kg 94.4 kg    Examination:  General exam: Appears calm and comfortable  Respiratory system: Clear to auscultation. Respiratory effort normal.  No active wheezing currently noted.  Currently on BiPAP with FiO2 50%. Cardiovascular system: S1 & S2 heard, RRR. No JVD, murmurs, rubs, gallops or clicks. No pedal edema. Gastrointestinal system: Abdomen is nondistended, soft and nontender. No organomegaly or masses felt. Normal bowel sounds heard. Central nervous system: Alert and oriented. No focal neurological deficits. Extremities: Symmetric 5 x 5 power. Skin: No rashes, lesions or ulcers Psychiatry: Judgement and insight appear normal. Mood & affect appropriate.     Data Reviewed: I have personally reviewed following labs and imaging studies  CBC: Recent Labs  Lab 12/15/18 1551 12/16/18 0330  WBC 15.0* 11.6*  NEUTROABS 12.4*  --   HGB 14.9 13.2  HCT 50.2 44.7  MCV 92.8 93.9  PLT 260 161   Basic Metabolic Panel: Recent Labs  Lab 12/15/18 1551 12/16/18 0330  NA 134* 132*  K 5.3* 5.6*  CL 94* 99  CO2 27 27  GLUCOSE 138* 141*  BUN 24* 29*  CREATININE 2.05* 1.90*  CALCIUM 9.1 7.9*    GFR: Estimated Creatinine Clearance: 40.7 mL/min (A) (by C-G formula based on SCr of 1.9 mg/dL (H)). Liver Function Tests: Recent Labs  Lab 12/15/18 1551 12/16/18 0330  AST 20 13*  ALT 11 10  ALKPHOS 80 69  BILITOT 0.7 0.5  PROT 8.1 6.7  ALBUMIN 3.5 2.7*   No results for input(s): LIPASE, AMYLASE in the last 168 hours. No results for input(s): AMMONIA in the last 168 hours. Coagulation Profile: No results for input(s): INR, PROTIME in the last 168 hours. Cardiac Enzymes: No results for input(s): CKTOTAL, CKMB, CKMBINDEX, TROPONINI in the last 168 hours. BNP (last 3 results) No results for input(s): PROBNP in the last 8760 hours. HbA1C: No results for input(s): HGBA1C in the last 72 hours. CBG: No results for input(s): GLUCAP in the last 168 hours. Lipid Profile: No  results for input(s): CHOL, HDL, LDLCALC, TRIG, CHOLHDL, LDLDIRECT in the last 72 hours. Thyroid Function Tests: No results for input(s): TSH, T4TOTAL, FREET4, T3FREE, THYROIDAB in the last 72 hours. Anemia Panel: No results for input(s): VITAMINB12, FOLATE, FERRITIN, TIBC, IRON, RETICCTPCT in the last 72 hours. Sepsis Labs: Recent Labs  Lab 12/15/18 1551 12/15/18 1725  LATICACIDVEN 3.4* 3.5*    Recent Results (from the past 240 hour(s))  SARS Coronavirus 2 [facility] order, Performed in [facility] hospital lab) Nasopharyngeal Nasopharyngeal Swab     Status: None   Collection Time: 12/15/18  3:27 PM   Specimen: Nasopharyngeal Swab  Result Value Ref Range Status   SARS Coronavirus 2 NEGATIVE NEGATIVE Final    Comment: (NOTE) If result is NEGATIVE SARS-CoV-2 target nucleic acids are NOT DETECTED. The SARS-CoV-2 RNA is generally detectable in upper and lower  respiratory specimens during the acute phase of infection. The lowest  concentration of SARS-CoV-2 viral copies this assay can detect is 250  copies / mL. A negative result does not preclude SARS-CoV-2 infection  and should not be used as the sole  basis for treatment or other  patient management decisions.  A negative result may occur with  improper specimen collection / handling, submission of specimen other  than nasopharyngeal swab, presence of viral mutation(s) within the  areas targeted by this assay, and inadequate number of viral copies  (<250 copies / mL). A negative result must be combined with clinical  observations, patient history, and epidemiological information. If result is POSITIVE SARS-CoV-2 target nucleic acids are DETECTED. The SARS-CoV-2 RNA is generally detectable in upper and lower  respiratory specimens dur ing the acute phase of infection.  Positive  results are indicative of active infection with SARS-CoV-2.  Clinical  correlation with patient history and other diagnostic information is  necessary to determine patient infection status.  Positive results do  not rule out bacterial infection or co-infection with other viruses. If result is PRESUMPTIVE POSTIVE SARS-CoV-2 nucleic acids MAY BE PRESENT.   A presumptive positive result was obtained on the submitted specimen  and confirmed on repeat testing.  While 2019 novel coronavirus  (SARS-CoV-2) nucleic acids may be present in the submitted sample  additional confirmatory testing may be necessary for epidemiological  and / or clinical management purposes  to differentiate between  SARS-CoV-2 and other Sarbecovirus currently known to infect humans.  If clinically indicated additional testing with an alternate test  methodology [phone]) is advised. The SARS-CoV-2 RNA is generally  detectable in upper and lower respiratory sp ecimens during the acute  phase of infection. The expected result is Negative. Fact Sheet for Patients:  StrictlyIdeas.no Fact Sheet for Healthcare Providers: BankingDealers.co.za This test is not yet approved or cleared by the [place] FDA and has been authorized for detection  and/or diagnosis of SARS-CoV-2 by FDA under an Emergency Use Authorization (EUA).  This EUA will remain in effect (meaning this test can be used) for the duration of the COVID-19 declaration under Section 564(b)(1) of the Act, 21 U.S.C. section 360bbb-3(b)(1), unless the authorization is terminated or revoked sooner. Performed at [facility], [address] [place], [place] [address]   Blood culture (routine x 2)     Status: None (Preliminary result)   Collection Time: 12/15/18  3:51 PM   Specimen: BLOOD RIGHT WRIST  Result Value Ref Range Status   Specimen Description BLOOD RIGHT WRIST  Final   Special Requests   Final    BOTTLES DRAWN AEROBIC  AND ANAEROBIC Blood Culture adequate volume   Culture   Final    NO GROWTH < 24 HOURS Performed at [facility], [address] [place], [place] [address]    Report Status PENDING  Incomplete  Blood culture (routine x 2)     Status: None (Preliminary result)   Collection Time: 12/15/18  3:52 PM   Specimen: Left Antecubital; Blood  Result Value Ref Range Status   Specimen Description LEFT ANTECUBITAL  Final   Special Requests   Final    BOTTLES DRAWN AEROBIC AND ANAEROBIC Blood Culture results may not be optimal due to an excessive volume of blood received in culture bottles   Culture   Final    NO GROWTH < 24 HOURS Performed at [facility], [address], [place], [place] [address]    Report Status PENDING  Incomplete         Radiology Studies: Dg Chest Port 1 View  Result Date: 12/15/2018 CLINICAL DATA:  Fever, weakness EXAM: PORTABLE CHEST 1 VIEW COMPARISON:  08/20/2017 chest radiograph. FINDINGS: Low lung volumes. Stable cardiomediastinal silhouette with moderate cardiomegaly. No pneumothorax. Calcified bilateral pleural plaques and pleural thickening, unchanged. No convincing pleural effusion. Patchy consolidation at both lung bases superimposed on chronic reticular opacities. No overt pulmonary edema. IMPRESSION: 1. Patchy  consolidation superimposed on chronic reticular opacities at the lung bases, cannot exclude aspiration or pneumonia. Chest radiograph follow-up advised. 2. 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 3. Moderate cardiomegaly without overt pulmonary edema. Electronically Signed   By: Ilona Sorrel M.D.   On: 12/15/2018 18:08   Dg Knee Complete 4 Views Right  Result Date: 12/15/2018 CLINICAL DATA:  Fall, right knee pain when moving EXAM: RIGHT KNEE - COMPLETE 4+ VIEW COMPARISON:  None. FINDINGS: No fracture or dislocation. Small suprapatellar right knee joint effusion. No suspicious focal osseous lesion. Small superior right patellar enthesophyte. No significant degenerative arthropathy. Posterior vascular calcifications. No radiopaque foreign body. IMPRESSION: Small suprapatellar right knee joint effusion, with no right knee fracture or dislocation. Electronically Signed   By: Ilona Sorrel M.D.   On: 12/15/2018 18:05        Scheduled Meds:  aspirin EC  81 mg Oral Daily   atorvastatin  40 mg Oral q1800   enoxaparin (LOVENOX) injection  40 mg Subcutaneous Q24H   finasteride  5 mg Oral Daily   ipratropium-albuterol  3 mL Nebulization Q6H   methylPREDNISolone (SOLU-MEDROL) injection  40 mg Intravenous Q12H   sodium zirconium cyclosilicate  10 g Oral Once   tamsulosin  0.4 mg Oral QHS   Continuous Infusions:  sodium chloride 100 mL/hr at 12/16/18 1107   sodium chloride 75 mL/hr at 12/16/18 0403   sodium chloride     azithromycin Stopped (12/15/18 2115)   cefTRIAXone (ROCEPHIN)  IV Stopped (12/15/18 2045)     LOS: 0 days    Time spent: 30 minutes    Jamicia Haaland Darleen Crocker, DO [facility] Pager [phone]  If 7PM-7AM, please contact night-coverage [email or web address] Password [facility] 12/16/2018, 11:20 AM

## 2018-12-16 NOTE — Progress Notes (Signed)
Patient expired at 2056. On call physician notified.  Came to bedside and notified family.  All paper work and flowsheets completed.  Post mortem care complete.  Funeral home has been notified.  Transporting to the morgue.

## 2018-12-16 NOTE — Care Management Obs Status (Signed)
[facility] NOTIFICATION   Patient Details  Name: Curtis [name] MRN: [id number] Date of Birth: 1945/10/20   Medicare Observation Status Notification Given:  Yes    Trish Mage, LCSW 12/16/2018, 8:53 AM

## 2018-12-17 LAB — CBC
HCT: 43 % (ref 39.0–52.0)
Hemoglobin: 12.8 g/dL — ABNORMAL LOW (ref 13.0–17.0)
MCH: 27.9 pg (ref 26.0–34.0)
MCHC: 29.8 g/dL — ABNORMAL LOW (ref 30.0–36.0)
MCV: 93.7 fL (ref 80.0–100.0)
Platelets: 196 10*3/uL (ref 150–400)
RBC: 4.59 MIL/uL (ref 4.22–5.81)
RDW: 18.9 % — ABNORMAL HIGH (ref 11.5–15.5)
WBC: 8.5 10*3/uL (ref 4.0–10.5)
nRBC: 0 % (ref 0.0–0.2)

## 2018-12-17 LAB — BASIC METABOLIC PANEL
Anion gap: 8 (ref 5–15)
BUN: 27 mg/dL — ABNORMAL HIGH (ref 8–23)
CO2: 26 mmol/L (ref 22–32)
Calcium: 8.4 mg/dL — ABNORMAL LOW (ref 8.9–10.3)
Chloride: 99 mmol/L (ref 98–111)
Creatinine, Ser: 1.12 mg/dL (ref 0.61–1.24)
GFR calc Af Amer: >60 mL/min (ref >60)
GFR calc non Af Amer: >60 mL/min (ref >60)
Glucose, Bld: 145 mg/dL — ABNORMAL HIGH (ref 70–99)
Potassium: 5.1 mmol/L (ref 3.5–5.1)
Sodium: 133 mmol/L — ABNORMAL LOW (ref 135–145)

## 2018-12-17 LAB — LACTIC ACID, PLASMA: Lactic Acid, Venous: 1.7 mmol/L (ref 0.5–1.9)

## 2018-12-17 MED ORDER — ALPRAZOLAM 1 MG PO TABS
2.0000 mg | ORAL_TABLET | Freq: Two times a day (BID) | ORAL | Status: DC
  Administered 2018-12-17 – 2018-12-19 (×5): 2 mg via ORAL
  Filled 2018-12-17: qty 4
  Filled 2018-12-17 (×4): qty 2

## 2018-12-17 MED ORDER — HYDRALAZINE HCL 20 MG/ML IJ SOLN: 10.0000 mg | INTRAMUSCULAR | Status: DC | PRN

## 2018-12-17 MED ORDER — AMLODIPINE BESYLATE 5 MG PO TABS
2.5000 mg | ORAL_TABLET | Freq: Every day | ORAL | Status: DC
  Administered 2018-12-17 – 2018-12-19 (×3): 2.5 mg via ORAL
  Filled 2018-12-17 (×3): qty 1

## 2018-12-17 MED ORDER — METOPROLOL TARTRATE 25 MG PO TABS
12.5000 mg | ORAL_TABLET | Freq: Two times a day (BID) | ORAL | Status: DC
  Administered 2018-12-17 – 2018-12-19 (×5): 12.5 mg via ORAL
  Filled 2018-12-17 (×5): qty 1

## 2018-12-17 NOTE — Clinical Social Work Note (Signed)
Referred patient to [facility] for all O2 needs.  They are delivering a tank here for transportation home, and will follow up with patient post d/c.

## 2018-12-17 NOTE — Progress Notes (Signed)
SATURATION QUALIFICATIONS: (This note is used to comply with regulatory documentation for home oxygen)  Patient Saturations on Room Air at Rest = 90%  Patient Saturations on Room Air while Ambulating = 83%  Patient Saturations on 4 Liters of oxygen while Ambulating = 92%  Please briefly explain why patient needs home oxygen: Pt needs home oxygen due to getting short of breath with activity and walking. Pt desats as well

## 2018-12-17 NOTE — Progress Notes (Signed)
PROGRESS NOTE    Curtis [name]  LAG:[id number] DOB: 01/08/1946 DOA: 12/15/2018 PCP: [name], MD   Brief Narrative:  Per HPI: Curtis Cipro Malloyis a 73 y.o.malewith medical history significant ofCOPD, pulmonary asbestosis, peripheral vascular disease, arthritis, hypertension, coronary artery disease, who comes to the hospital with chief complaint of weakness and shortness of breath. Patient's is on BiPAP and story is mostly contributed by son. He tells me that his dad called him yesterday that he fell on the floor and was too weak to get up,and that happened on 2 separate occasions in the morning as well as at lunchtime. Patient stated that he fell on his right knee. Today, patient called his son telling him that he cannot breathe. When son arrived, the patient was having difficulties breathing and son noticed that his lips were blue and decided to call 911. Patient tells me that his breathing is improved since he has been on BiPAP. Currently denies any chest pain, denies any abdominal pain, denies any nausea or vomiting. Denies any sore throat, but did have chest congestion patient himself thought he has pneumonia.  Patient was admitted with sepsis secondary to community-acquired pneumonia and has been started on Rocephin and azithromycin.  Discussed with pulmonology and will add IV steroids at this point to and maintain on breathing treatments and wean off BiPAP as tolerated.  He is also noted to have AKI with some mild hyperkalemia.  8/14: Patient appears to be doing much better this morning and has remained off BiPAP and is currently on 4 L nasal cannula.  He has some elevated blood pressure readings.  Laboratory data demonstrates improvement on azithromycin and Rocephin.  He is also on IV steroids.   Assessment & Plan:   Active Problems:   Sepsis ([place])   Acute combined respiratory failure secondary to sepsis from CAP with underlying COPD/asbestosis-improving -Continue  weaning from 4 L nasal cannula -Appreciate further pulmonology recommendations -Maintain on breathing treatments and IV steroids for now -Maintain on azithromycin and Rocephin with cultures pending  AKI with hyperkalemia-improving -Responded well to 1 dose of Lokelma -Continue on IV fluid for now due to negative fluid balance and will stop after 12 hours -Avoid nephrotoxic agents  -Repeat renal panel in a.m.  Hypertension- elevated -Resume home medications -Hydralazine as needed  Depression/anxiety -Continue amitriptyline and resume Xanax as needed  CAD -Continue aspirin and Lipitor  BPH -Continue finasteride and tamsulosin   DVT prophylaxis:  Heparin Code Status: DNI Family Communication: We will call son for update Disposition Plan:  Okay for transfer to [place].  Further recommendations from pulmonology appreciated.  Continue current IV antibiotics.  Recheck a.m. labs.  Wean oxygen as tolerated.   Consultants:   Pulmonology  Procedures:   None  Antimicrobials:  Anti-infectives (From admission, onward)   Start     Dose/Rate Route Frequency Ordered Stop   12/15/18 2000  azithromycin (ZITHROMAX) 500 mg in sodium chloride 0.9 % 250 mL IVPB     500 mg 250 mL/hr over 60 Minutes Intravenous Every 24 hours 12/15/18 1848     12/15/18 1930  cefTRIAXone (ROCEPHIN) 1 g in sodium chloride 0.9 % 100 mL IVPB     1 g 200 mL/hr over 30 Minutes Intravenous Every 24 hours 12/15/18 1847     12/15/18 1700  vancomycin (VANCOCIN) IVPB 1000 mg/200 mL premix     1,000 mg 200 mL/hr over 60 Minutes Intravenous  Once 12/15/18 1552 12/15/18 1841   12/15/18 1600  vancomycin (  VANCOCIN) IVPB 1000 mg/200 mL premix     1,000 mg 200 mL/hr over 60 Minutes Intravenous  Once 12/15/18 1552 12/15/18 1740   12/15/18 1600  ceFEPIme (MAXIPIME) 2 g in sodium chloride 0.9 % 100 mL IVPB     2 g 200 mL/hr over 30 Minutes Intravenous  Once 12/15/18 1556 12/15/18 1734   12/15/18 1530  ceFEPIme  (MAXIPIME) 1 g in sodium chloride 0.9 % 100 mL IVPB  Status:  Discontinued     1 g 200 mL/hr over 30 Minutes Intravenous  Once 12/15/18 1527 12/15/18 1557   12/15/18 1530  vancomycin (VANCOCIN) 2,000 mg in sodium chloride 0.9 % 500 mL IVPB  Status:  Discontinued     2,000 mg 250 mL/hr over 120 Minutes Intravenous  Once 12/15/18 1527 12/15/18 1552       Subjective: Patient seen and evaluated today with no new acute complaints or concerns. No acute concerns or events noted overnight.  He has been weaned off BiPAP and is currently comfortable on 4 L nasal cannula.  Objective: Vitals:   12/17/18 0100 12/17/18 0200 12/17/18 0400 12/17/18 0500  BP: (!) 148/81 (!) 160/80 (!) 159/102   Pulse: 85 92 85   Resp: 16 16 14    Temp:   98.1 F (36.7 C)   TempSrc:   Oral   SpO2: (!) 89% 93% 93%   Weight:    94.1 kg  Height:        Intake/Output Summary (Last 24 hours) at 12/17/2018 0746 Last data filed at 12/16/2018 2100 Gross per 24 hour  Intake 88.79 ml  Output 1450 ml  Net -1361.21 ml   Filed Weights   12/15/18 1518 12/16/18 1040 12/17/18 0500  Weight: 98 kg 94.4 kg 94.1 kg    Examination:  General exam: Appears calm and comfortable  Respiratory system: Clear to auscultation. Respiratory effort normal.  Currently on 4 L nasal cannula.  Lung sounds diminished in bases.  No active wheezing. Cardiovascular system: S1 & S2 heard, RRR. No JVD, murmurs, rubs, gallops or clicks. No pedal edema. Gastrointestinal system: Abdomen is nondistended, soft and nontender. No organomegaly or masses felt. Normal bowel sounds heard. Central nervous system: Alert and oriented. No focal neurological deficits. Extremities: Symmetric 5 x 5 power. Skin: No rashes, lesions or ulcers Psychiatry: Judgement and insight appear normal. Mood & affect appropriate.     Data Reviewed: I have personally reviewed following labs and imaging studies  CBC: Recent Labs  Lab 12/15/18 1551 12/16/18 0330 12/17/18  0459  WBC 15.0* 11.6* 8.5  NEUTROABS 12.4*  --   --   HGB 14.9 13.2 12.8*  HCT 50.2 44.7 43.0  MCV 92.8 93.9 93.7  PLT 260 199 767   Basic Metabolic Panel: Recent Labs  Lab 12/15/18 1551 12/16/18 0330 12/17/18 0459  NA 134* 132* 133*  K 5.3* 5.6* 5.1  CL 94* 99 99  CO2 27 27 26   GLUCOSE 138* 141* 145*  BUN 24* 29* 27*  CREATININE 2.05* 1.90* 1.12  CALCIUM 9.1 7.9* 8.4*   GFR: Estimated Creatinine Clearance: 68.9 mL/min (by C-G formula based on SCr of 1.12 mg/dL). Liver Function Tests: Recent Labs  Lab 12/15/18 1551 12/16/18 0330  AST 20 13*  ALT 11 10  ALKPHOS 80 69  BILITOT 0.7 0.5  PROT 8.1 6.7  ALBUMIN 3.5 2.7*   No results for input(s): LIPASE, AMYLASE in the last 168 hours. No results for input(s): AMMONIA in the last 168 hours. Coagulation Profile: No  results for input(s): INR, PROTIME in the last 168 hours. Cardiac Enzymes: No results for input(s): CKTOTAL, CKMB, CKMBINDEX, TROPONINI in the last 168 hours. BNP (last 3 results) No results for input(s): PROBNP in the last 8760 hours. HbA1C: No results for input(s): HGBA1C in the last 72 hours. CBG: No results for input(s): GLUCAP in the last 168 hours. Lipid Profile: No results for input(s): CHOL, HDL, LDLCALC, TRIG, CHOLHDL, LDLDIRECT in the last 72 hours. Thyroid Function Tests: No results for input(s): TSH, T4TOTAL, FREET4, T3FREE, THYROIDAB in the last 72 hours. Anemia Panel: No results for input(s): VITAMINB12, FOLATE, FERRITIN, TIBC, IRON, RETICCTPCT in the last 72 hours. Sepsis Labs: Recent Labs  Lab 12/15/18 1551 12/15/18 1725 12/17/18 0459  LATICACIDVEN 3.4* 3.5* 1.7    Recent Results (from the past 240 hour(s))  SARS Coronavirus 2 [facility] order, Performed in [facility] hospital lab) Nasopharyngeal Nasopharyngeal Swab     Status: None   Collection Time: 12/15/18  3:27 PM   Specimen: Nasopharyngeal Swab  Result Value Ref Range Status   SARS Coronavirus 2 NEGATIVE NEGATIVE Final     Comment: (NOTE) If result is NEGATIVE SARS-CoV-2 target nucleic acids are NOT DETECTED. The SARS-CoV-2 RNA is generally detectable in upper and lower  respiratory specimens during the acute phase of infection. The lowest  concentration of SARS-CoV-2 viral copies this assay can detect is 250  copies / mL. A negative result does not preclude SARS-CoV-2 infection  and should not be used as the sole basis for treatment or other  patient management decisions.  A negative result may occur with  improper specimen collection / handling, submission of specimen other  than nasopharyngeal swab, presence of viral mutation(s) within the  areas targeted by this assay, and inadequate number of viral copies  (<250 copies / mL). A negative result must be combined with clinical  observations, patient history, and epidemiological information. If result is POSITIVE SARS-CoV-2 target nucleic acids are DETECTED. The SARS-CoV-2 RNA is generally detectable in upper and lower  respiratory specimens dur ing the acute phase of infection.  Positive  results are indicative of active infection with SARS-CoV-2.  Clinical  correlation with patient history and other diagnostic information is  necessary to determine patient infection status.  Positive results do  not rule out bacterial infection or co-infection with other viruses. If result is PRESUMPTIVE POSTIVE SARS-CoV-2 nucleic acids MAY BE PRESENT.   A presumptive positive result was obtained on the submitted specimen  and confirmed on repeat testing.  While 2019 novel coronavirus  (SARS-CoV-2) nucleic acids may be present in the submitted sample  additional confirmatory testing may be necessary for epidemiological  and / or clinical management purposes  to differentiate between  SARS-CoV-2 and other Sarbecovirus currently known to infect humans.  If clinically indicated additional testing with an alternate test  methodology [phone]) is advised. The SARS-CoV-2  RNA is generally  detectable in upper and lower respiratory sp ecimens during the acute  phase of infection. The expected result is Negative. Fact Sheet for Patients:  StrictlyIdeas.no Fact Sheet for Healthcare Providers: BankingDealers.co.za This test is not yet approved or cleared by the [place] FDA and has been authorized for detection and/or diagnosis of SARS-CoV-2 by FDA under an Emergency Use Authorization (EUA).  This EUA will remain in effect (meaning this test can be used) for the duration of the COVID-19 declaration under Section 564(b)(1) of the Act, 21 U.S.C. section 360bbb-3(b)(1), unless the authorization is terminated or revoked sooner. Performed  at [facility], [address] [place], [place] [address]   Blood culture (routine x 2)     Status: None (Preliminary result)   Collection Time: 12/15/18  3:51 PM   Specimen: BLOOD RIGHT WRIST  Result Value Ref Range Status   Specimen Description BLOOD RIGHT WRIST  Final   Special Requests   Final    BOTTLES DRAWN AEROBIC AND ANAEROBIC Blood Culture adequate volume   Culture   Final    NO GROWTH < 24 HOURS Performed at [facility], [address], [place], [place] [address]    Report Status PENDING  Incomplete  Blood culture (routine x 2)     Status: None (Preliminary result)   Collection Time: 12/15/18  3:52 PM   Specimen: Left Antecubital; Blood  Result Value Ref Range Status   Specimen Description LEFT ANTECUBITAL  Final   Special Requests   Final    BOTTLES DRAWN AEROBIC AND ANAEROBIC Blood Culture results may not be optimal due to an excessive volume of blood received in culture bottles   Culture   Final    NO GROWTH < 24 HOURS Performed at [facility], [address], [place], [place] [address]    Report Status PENDING  Incomplete  MRSA PCR Screening     Status: None   Collection Time: 12/16/18 10:29 AM   Specimen: Nasal Mucosa; Nasopharyngeal  Result Value  Ref Range Status   MRSA by PCR NEGATIVE NEGATIVE Final    Comment:        The GeneXpert MRSA Assay (FDA approved for NASAL specimens only), is one component of a comprehensive MRSA colonization surveillance program. It is not intended to diagnose MRSA infection nor to guide or monitor treatment for MRSA infections. Performed at [facility], [address] [place], [place] [address]          Radiology Studies: Dg Chest [facility] 1 View  Result Date: 12/15/2018 CLINICAL DATA:  Fever, weakness EXAM: PORTABLE CHEST 1 VIEW COMPARISON:  08/20/2017 chest radiograph. FINDINGS: Low lung volumes. Stable cardiomediastinal silhouette with moderate cardiomegaly. No pneumothorax. Calcified bilateral pleural plaques and pleural thickening, unchanged. No convincing pleural effusion. Patchy consolidation at both lung bases superimposed on chronic reticular opacities. No overt pulmonary edema. IMPRESSION: 1. Patchy consolidation superimposed on chronic reticular opacities at the lung bases, cannot exclude aspiration or pneumonia. Chest radiograph follow-up advised. 2. 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 3. Moderate cardiomegaly without overt pulmonary edema. Electronically Signed   By: Ilona Sorrel M.D.   On: 12/15/2018 18:08   Dg Knee Complete 4 Views Right  Result Date: 12/15/2018 CLINICAL DATA:  Fall, right knee pain when moving EXAM: RIGHT KNEE - COMPLETE 4+ VIEW COMPARISON:  None. FINDINGS: No fracture or dislocation. Small suprapatellar right knee joint effusion. No suspicious focal osseous lesion. Small superior right patellar enthesophyte. No significant degenerative arthropathy. Posterior vascular calcifications. No radiopaque foreign body. IMPRESSION: Small suprapatellar right knee joint effusion, with no right knee fracture or dislocation. Electronically Signed   By: Ilona Sorrel M.D.   On: 12/15/2018 18:05        Scheduled Meds: .  amitriptyline  100 mg Oral QHS  . aspirin EC  81 mg Oral Daily  . atorvastatin  40 mg Oral q1800  . finasteride  5 mg Oral Daily  . gabapentin  800 mg Oral TID  . guaiFENesin  600 mg Oral BID  . heparin injection (subcutaneous)  5,000 Units Subcutaneous Q8H  .  ipratropium-albuterol  3 mL Nebulization Q6H  . latanoprost  1 drop Both Eyes QHS  . methylPREDNISolone (SOLU-MEDROL) injection  40 mg Intravenous Q12H  . tamsulosin  0.4 mg Oral QHS   Continuous Infusions: . sodium chloride 75 mL/hr at 12/16/18 2334  . sodium chloride    . azithromycin 500 mg (12/16/18 2119)  . cefTRIAXone (ROCEPHIN)  IV 1 g (12/16/18 2005)     LOS: 1 day    Time spent: 30 minutes    Othell Diluzio Darleen Crocker, DO [facility] Pager [phone]  If 7PM-7AM, please contact night-coverage [email or web address] Password [facility] 12/17/2018, 7:46 AM

## 2018-12-17 NOTE — Progress Notes (Signed)
Please disregard progress note regarding patient expiration 8/13 @2226 

## 2018-12-17 NOTE — Progress Notes (Signed)
Subjective: He says he feels much better.  He is not as short of breath.  He is off BiPAP and now on 4 L nasal oxygen.  He says he is coughing a little bit but mostly nonproductively.  No other new complaints  Objective: Vital signs in last 24 hours: Temp:  [97.5 F (36.4 C)-98.5 F (36.9 C)] 98.1 F (36.7 C) (08/14 0400) Pulse Rate:  [83-114] 85 (08/14 0400) Resp:  [13-23] 14 (08/14 0400) BP: (118-162)/(56-102) 159/102 (08/14 0400) SpO2:  [89 %-98 %] 93 % (08/14 0400) FiO2 (%):  [50 %] 50 % (08/13 0938) Weight:  [94.1 kg-94.4 kg] 94.1 kg (08/14 0500) Weight change: -3.577 kg Last BM Date: 12/13/18  Intake/Output from previous day: 08/13 0701 - 08/14 0700 In: 88.8 [I.V.:88.8] Out: 1450 [Urine:1450]  PHYSICAL EXAM General appearance: alert, cooperative and no distress Resp: rales bilaterally and rhonchi bilaterally Cardio: regular rate and rhythm, S1, S2 normal, no murmur, click, rub or gallop GI: soft, non-tender; bowel sounds normal; no masses,  no organomegaly Extremities: extremities normal, atraumatic, no cyanosis or edema  Lab Results:  Results for orders placed or performed during the hospital encounter of 12/15/18 (from the past 48 hour(s))  SARS Coronavirus 2 [facility] order, Performed in [facility] hospital lab) Nasopharyngeal Nasopharyngeal Swab     Status: None   Collection Time: 12/15/18  3:27 PM   Specimen: Nasopharyngeal Swab  Result Value Ref Range   SARS Coronavirus 2 NEGATIVE NEGATIVE    Comment: (NOTE) If result is NEGATIVE SARS-CoV-2 target nucleic acids are NOT DETECTED. The SARS-CoV-2 RNA is generally detectable in upper and lower  respiratory specimens during the acute phase of infection. The lowest  concentration of SARS-CoV-2 viral copies this assay can detect is 250  copies / mL. A negative result does not preclude SARS-CoV-2 infection  and should not be used as the sole basis for treatment or other  patient management decisions.  A negative  result may occur with  improper specimen collection / handling, submission of specimen other  than nasopharyngeal swab, presence of viral mutation(s) within the  areas targeted by this assay, and inadequate number of viral copies  (<250 copies / mL). A negative result must be combined with clinical  observations, patient history, and epidemiological information. If result is POSITIVE SARS-CoV-2 target nucleic acids are DETECTED. The SARS-CoV-2 RNA is generally detectable in upper and lower  respiratory specimens dur ing the acute phase of infection.  Positive  results are indicative of active infection with SARS-CoV-2.  Clinical  correlation with patient history and other diagnostic information is  necessary to determine patient infection status.  Positive results do  not rule out bacterial infection or co-infection with other viruses. If result is PRESUMPTIVE POSTIVE SARS-CoV-2 nucleic acids MAY BE PRESENT.   A presumptive positive result was obtained on the submitted specimen  and confirmed on repeat testing.  While 2019 novel coronavirus  (SARS-CoV-2) nucleic acids may be present in the submitted sample  additional confirmatory testing may be necessary for epidemiological  and / or clinical management purposes  to differentiate between  SARS-CoV-2 and other Sarbecovirus currently known to infect humans.  If clinically indicated additional testing with an alternate test  methodology [phone]) is advised. The SARS-CoV-2 RNA is generally  detectable in upper and lower respiratory sp ecimens during the acute  phase of infection. The expected result is Negative. Fact Sheet for Patients:  StrictlyIdeas.no Fact Sheet for Healthcare Providers: BankingDealers.co.za This test is not yet approved  or cleared by the [place] and has been authorized for detection and/or diagnosis of SARS-CoV-2 by FDA under an Emergency Use Authorization  (EUA).  This EUA will remain in effect (meaning this test can be used) for the duration of the COVID-19 declaration under Section 564(b)(1) of the Act, 21 U.S.C. section 360bbb-3(b)(1), unless the authorization is terminated or revoked sooner. Performed at [facility], [address] [place], [place] [address]   Lactic acid, plasma     Status: Abnormal   Collection Time: 12/15/18  3:51 PM  Result Value Ref Range   Lactic Acid, Venous 3.4 (HH) 0.5 - 1.9 mmol/L    Comment: CRITICAL RESULT CALLED TO, READ BACK BY AND VERIFIED WITH: MINTER,R ON 12/15/18 AT 1700 BY LOY,C Performed at [facility], [address] [place], [place] [address]   Comprehensive metabolic panel     Status: Abnormal   Collection Time: 12/15/18  3:51 PM  Result Value Ref Range   Sodium 134 (L) 135 - 145 mmol/L   Potassium 5.3 (H) 3.5 - 5.1 mmol/L   Chloride 94 (L) 98 - 111 mmol/L   CO2 27 22 - 32 mmol/L   Glucose, Bld 138 (H) 70 - 99 mg/dL   BUN 24 (H) 8 - 23 mg/dL   Creatinine, Ser 2.05 (H) 0.61 - 1.24 mg/dL   Calcium 9.1 8.9 - 10.3 mg/dL   Total Protein 8.1 6.5 - 8.1 g/dL   Albumin 3.5 3.5 - 5.0 g/dL   AST 20 15 - 41 U/L   ALT 11 0 - 44 U/L   Alkaline Phosphatase 80 38 - 126 U/L   Total Bilirubin 0.7 0.3 - 1.2 mg/dL   GFR calc non Af Amer 31 (L) >60 mL/min   GFR calc Af Amer 36 (L) >60 mL/min   Anion gap 13 5 - 15    Comment: Performed at [facility], [address] [place], [place] [address]  CBC with Differential     Status: Abnormal   Collection Time: 12/15/18  3:51 PM  Result Value Ref Range   WBC 15.0 (H) 4.0 - 10.5 K/uL   RBC 5.41 4.22 - 5.81 MIL/uL   Hemoglobin 14.9 13.0 - 17.0 g/dL   HCT 50.2 39.0 - 52.0 %   MCV 92.8 80.0 - 100.0 fL   MCH 27.5 26.0 - 34.0 pg   MCHC 29.7 (L) 30.0 - 36.0 g/dL   RDW 19.9 (H) 11.5 - 15.5 %   Platelets 260 150 - 400 K/uL   nRBC 0.7 (H) 0.0 - 0.2 %   Neutrophils Relative % 83 %   Neutro Abs 12.4 (H) 1.7 - 7.7 K/uL   Lymphocytes Relative 7 %   Lymphs Abs 1.0  0.7 - 4.0 K/uL   Monocytes Relative 8 %   Monocytes Absolute 1.2 (H) 0.1 - 1.0 K/uL   Eosinophils Relative 0 %   Eosinophils Absolute 0.0 0.0 - 0.5 K/uL   Basophils Relative 1 %   Basophils Absolute 0.1 0.0 - 0.1 K/uL   Immature Granulocytes 1 %   Abs Immature Granulocytes 0.17 (H) 0.00 - 0.07 K/uL    Comment: Performed at [facility], [address] [place], [place] [address]  Blood culture (routine x 2)     Status: None (Preliminary result)   Collection Time: 12/15/18  3:51 PM   Specimen: BLOOD RIGHT WRIST  Result Value Ref Range   Specimen Description BLOOD RIGHT WRIST    Special Requests  BOTTLES DRAWN AEROBIC AND ANAEROBIC Blood Culture adequate volume   Culture      NO GROWTH < 24 HOURS Performed at [facility], [address] [place], [place] [address]    Report Status PENDING   Blood culture (routine x 2)     Status: None (Preliminary result)   Collection Time: 12/15/18  3:52 PM   Specimen: Left Antecubital; Blood  Result Value Ref Range   Specimen Description LEFT ANTECUBITAL    Special Requests      BOTTLES DRAWN AEROBIC AND ANAEROBIC Blood Culture results may not be optimal due to an excessive volume of blood received in culture bottles   Culture      NO GROWTH < 24 HOURS Performed at [facility], [address] [place], [place] [address]    Report Status PENDING   Lactic acid, plasma     Status: Abnormal   Collection Time: 12/15/18  5:25 PM  Result Value Ref Range   Lactic Acid, Venous 3.5 (HH) 0.5 - 1.9 mmol/L    Comment: CRITICAL RESULT CALLED TO, READ BACK BY AND VERIFIED WITH: WILEY,E ON 12/15/18 AT 1855 BY LOY,C Performed at [facility], [address], [place], [place] [address]   Blood gas, arterial (at [facility])     Status: Abnormal   Collection Time: 12/15/18  5:40 PM  Result Value Ref Range   FIO2 36.00    pH, Arterial 7.247 (L) 7.350 - 7.450   pCO2 arterial 70.8 (HH) 32.0 - 48.0 mmHg    Comment: CRITICAL RESULT CALLED TO, READ BACK BY AND  VERIFIED WITH: WILEY,E ON 12/15/18 AT 1755 BY LOY,C    pO2, Arterial 85.4 83.0 - 108.0 mmHg   Bicarbonate 25.0 20.0 - 28.0 mmol/L   Acid-Base Excess 3.1 (H) 0.0 - 2.0 mmol/L   O2 Saturation 93.2 %   Patient temperature 38.7    Allens test (pass/fail) PASS PASS    Comment: Performed at [facility], [address], [place], [place] [address]  Comprehensive metabolic panel     Status: Abnormal   Collection Time: 12/16/18  3:30 AM  Result Value Ref Range   Sodium 132 (L) 135 - 145 mmol/L   Potassium 5.6 (H) 3.5 - 5.1 mmol/L   Chloride 99 98 - 111 mmol/L   CO2 27 22 - 32 mmol/L   Glucose, Bld 141 (H) 70 - 99 mg/dL   BUN 29 (H) 8 - 23 mg/dL   Creatinine, Ser 1.90 (H) 0.61 - 1.24 mg/dL   Calcium 7.9 (L) 8.9 - 10.3 mg/dL   Total Protein 6.7 6.5 - 8.1 g/dL   Albumin 2.7 (L) 3.5 - 5.0 g/dL   AST 13 (L) 15 - 41 U/L   ALT 10 0 - 44 U/L   Alkaline Phosphatase 69 38 - 126 U/L   Total Bilirubin 0.5 0.3 - 1.2 mg/dL   GFR calc non Af Amer 34 (L) >60 mL/min   GFR calc Af Amer 39 (L) >60 mL/min   Anion gap 6 5 - 15    Comment: Performed at [facility], [address], [place], [place] [address]  CBC     Status: Abnormal   Collection Time: 12/16/18  3:30 AM  Result Value Ref Range   WBC 11.6 (H) 4.0 - 10.5 K/uL   RBC 4.76 4.22 - 5.81 MIL/uL   Hemoglobin 13.2 13.0 - 17.0 g/dL   HCT 44.7 39.0 - 52.0 %   MCV 93.9 80.0 - 100.0 fL  MCH 27.7 26.0 - 34.0 pg   MCHC 29.5 (L) 30.0 - 36.0 g/dL   RDW 19.5 (H) 11.5 - 15.5 %   Platelets 199 150 - 400 K/uL   nRBC 0.3 (H) 0.0 - 0.2 %    Comment: Performed at [facility], [address], [place], [name] [address]  MRSA PCR Screening     Status: None   Collection Time: 12/16/18 10:29 AM   Specimen: Nasal Mucosa; Nasopharyngeal  Result Value Ref Range   MRSA by PCR NEGATIVE NEGATIVE    Comment:        The GeneXpert MRSA Assay (FDA approved for NASAL specimens only), is one component of a comprehensive MRSA colonization surveillance program. It is  not intended to diagnose MRSA infection nor to guide or monitor treatment for MRSA infections. Performed at [facility], [address], [place], [place] [address]   Urinalysis, Routine w reflex microscopic     Status: Abnormal   Collection Time: 12/16/18 12:00 PM  Result Value Ref Range   Color, Urine AMBER (A) YELLOW    Comment: BIOCHEMICALS MAY BE AFFECTED BY COLOR   APPearance HAZY (A) CLEAR   Specific Gravity, Urine 1.026 1.005 - 1.030   pH 5.0 5.0 - 8.0   Glucose, UA NEGATIVE NEGATIVE mg/dL   Hgb urine dipstick SMALL (A) NEGATIVE   Bilirubin Urine NEGATIVE NEGATIVE   Ketones, ur NEGATIVE NEGATIVE mg/dL   Protein, ur 30 (A) NEGATIVE mg/dL   Nitrite NEGATIVE NEGATIVE   Leukocytes,Ua NEGATIVE NEGATIVE   RBC / HPF 0-5 0 - 5 RBC/hpf   WBC, UA 0-5 0 - 5 WBC/hpf   Bacteria, UA NONE SEEN NONE SEEN   Mucus PRESENT    Hyaline Casts, UA PRESENT     Comment: Performed at [facility], [address], [place], [place] [address]  Lactic acid, plasma     Status: None   Collection Time: 12/17/18  4:59 AM  Result Value Ref Range   Lactic Acid, Venous 1.7 0.5 - 1.9 mmol/L    Comment: Performed at [facility], [address], [place], [place] [address]  CBC     Status: Abnormal   Collection Time: 12/17/18  4:59 AM  Result Value Ref Range   WBC 8.5 4.0 - 10.5 K/uL   RBC 4.59 4.22 - 5.81 MIL/uL   Hemoglobin 12.8 (L) 13.0 - 17.0 g/dL   HCT 43.0 39.0 - 52.0 %   MCV 93.7 80.0 - 100.0 fL   MCH 27.9 26.0 - 34.0 pg   MCHC 29.8 (L) 30.0 - 36.0 g/dL   RDW 18.9 (H) 11.5 - 15.5 %   Platelets 196 150 - 400 K/uL   nRBC 0.0 0.0 - 0.2 %    Comment: Performed at [facility], [address], [place], [place] [address]  Basic metabolic panel     Status: Abnormal   Collection Time: 12/17/18  4:59 AM  Result Value Ref Range   Sodium 133 (L) 135 - 145 mmol/L   Potassium 5.1 3.5 - 5.1 mmol/L   Chloride 99 98 - 111 mmol/L   CO2 26 22 - 32 mmol/L   Glucose, Bld 145 (H) 70 - 99 mg/dL   BUN 27 (H) 8 -  23 mg/dL   Creatinine, Ser 1.12 0.61 - 1.24 mg/dL   Calcium 8.4 (L) 8.9 - 10.3 mg/dL   GFR calc non Af Amer >60 >60 mL/min   GFR calc Af Amer >60 >60 mL/min   Anion gap 8  5 - 15    Comment: Performed at [facility], [address] [place], [place] [address]    ABGS Recent Labs    12/15/18 1740  PHART 7.247*  PO2ART 85.4  HCO3 25.0   CULTURES Recent Results (from the past 240 hour(s))  SARS Coronavirus 2 [facility] order, Performed in [facility] hospital lab) Nasopharyngeal Nasopharyngeal Swab     Status: None   Collection Time: 12/15/18  3:27 PM   Specimen: Nasopharyngeal Swab  Result Value Ref Range Status   SARS Coronavirus 2 NEGATIVE NEGATIVE Final    Comment: (NOTE) If result is NEGATIVE SARS-CoV-2 target nucleic acids are NOT DETECTED. The SARS-CoV-2 RNA is generally detectable in upper and lower  respiratory specimens during the acute phase of infection. The lowest  concentration of SARS-CoV-2 viral copies this assay can detect is 250  copies / mL. A negative result does not preclude SARS-CoV-2 infection  and should not be used as the sole basis for treatment or other  patient management decisions.  A negative result may occur with  improper specimen collection / handling, submission of specimen other  than nasopharyngeal swab, presence of viral mutation(s) within the  areas targeted by this assay, and inadequate number of viral copies  (<250 copies / mL). A negative result must be combined with clinical  observations, patient history, and epidemiological information. If result is POSITIVE SARS-CoV-2 target nucleic acids are DETECTED. The SARS-CoV-2 RNA is generally detectable in upper and lower  respiratory specimens dur ing the acute phase of infection.  Positive  results are indicative of active infection with SARS-CoV-2.  Clinical  correlation with patient history and other diagnostic information is  necessary to determine patient infection status.  Positive  results do  not rule out bacterial infection or co-infection with other viruses. If result is PRESUMPTIVE POSTIVE SARS-CoV-2 nucleic acids MAY BE PRESENT.   A presumptive positive result was obtained on the submitted specimen  and confirmed on repeat testing.  While 2019 novel coronavirus  (SARS-CoV-2) nucleic acids may be present in the submitted sample  additional confirmatory testing may be necessary for epidemiological  and / or clinical management purposes  to differentiate between  SARS-CoV-2 and other Sarbecovirus currently known to infect humans.  If clinically indicated additional testing with an alternate test  methodology [phone]) is advised. The SARS-CoV-2 RNA is generally  detectable in upper and lower respiratory sp ecimens during the acute  phase of infection. The expected result is Negative. Fact Sheet for Patients:  StrictlyIdeas.no Fact Sheet for Healthcare Providers: BankingDealers.co.za This test is not yet approved or cleared by the [place] FDA and has been authorized for detection and/or diagnosis of SARS-CoV-2 by FDA under an Emergency Use Authorization (EUA).  This EUA will remain in effect (meaning this test can be used) for the duration of the COVID-19 declaration under Section 564(b)(1) of the Act, 21 U.S.C. section 360bbb-3(b)(1), unless the authorization is terminated or revoked sooner. Performed at [facility], [address] [place], [place] [address]   Blood culture (routine x 2)     Status: None (Preliminary result)   Collection Time: 12/15/18  3:51 PM   Specimen: BLOOD RIGHT WRIST  Result Value Ref Range Status   Specimen Description BLOOD RIGHT WRIST  Final   Special Requests   Final    BOTTLES DRAWN AEROBIC AND ANAEROBIC Blood Culture adequate volume   Culture   Final    NO GROWTH < 24 HOURS Performed at [facility],  [address] [place], [place] [address]    Report Status PENDING   Incomplete  Blood culture (routine x 2)     Status: None (Preliminary result)   Collection Time: 12/15/18  3:52 PM   Specimen: Left Antecubital; Blood  Result Value Ref Range Status   Specimen Description LEFT ANTECUBITAL  Final   Special Requests   Final    BOTTLES DRAWN AEROBIC AND ANAEROBIC Blood Culture results may not be optimal due to an excessive volume of blood received in culture bottles   Culture   Final    NO GROWTH < 24 HOURS Performed at [facility], [address] [place], [place] [address]    Report Status PENDING  Incomplete  MRSA PCR Screening     Status: None   Collection Time: 12/16/18 10:29 AM   Specimen: Nasal Mucosa; Nasopharyngeal  Result Value Ref Range Status   MRSA by PCR NEGATIVE NEGATIVE Final    Comment:        The GeneXpert MRSA Assay (FDA approved for NASAL specimens only), is one component of a comprehensive MRSA colonization surveillance program. It is not intended to diagnose MRSA infection nor to guide or monitor treatment for MRSA infections. Performed at [facility], [address] [place], [place] [address]    Studies/Results: Dg Chest Port 1 View  Result Date: 12/15/2018 CLINICAL DATA:  Fever, weakness EXAM: PORTABLE CHEST 1 VIEW COMPARISON:  08/20/2017 chest radiograph. FINDINGS: Low lung volumes. Stable cardiomediastinal silhouette with moderate cardiomegaly. No pneumothorax. Calcified bilateral pleural plaques and pleural thickening, unchanged. No convincing pleural effusion. Patchy consolidation at both lung bases superimposed on chronic reticular opacities. No overt pulmonary edema. IMPRESSION: 1. Patchy consolidation superimposed on chronic reticular opacities at the lung bases, cannot exclude aspiration or pneumonia. Chest radiograph follow-up advised. 2. 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 3. Moderate cardiomegaly without overt pulmonary edema. Electronically Signed   By: Ilona Sorrel M.D.   On: 12/15/2018 18:08   Dg Knee Complete 4 Views Right  Result Date: 12/15/2018 CLINICAL DATA:  Fall, right knee pain when moving EXAM: RIGHT KNEE - COMPLETE 4+ VIEW COMPARISON:  None. FINDINGS: No fracture or dislocation. Small suprapatellar right knee joint effusion. No suspicious focal osseous lesion. Small superior right patellar enthesophyte. No significant degenerative arthropathy. Posterior vascular calcifications. No radiopaque foreign body. IMPRESSION: Small suprapatellar right knee joint effusion, with no right knee fracture or dislocation. Electronically Signed   By: Ilona Sorrel M.D.   On: 12/15/2018 18:05    Medications:  Prior to Admission:  Medications Prior to Admission  Medication Sig Dispense Refill Last Dose  . alprazolam (XANAX) 2 MG tablet Take 1 tablet (2 mg total) by mouth 2 (two) times daily. 30 tablet 0 12/15/2018 at Unknown time  . amitriptyline (ELAVIL) 100 MG tablet Take 100 mg by mouth at bedtime.   12/14/2018 at Unknown time  . amLODipine (NORVASC) 2.5 MG tablet TAKE 1 TABLET BY MOUTH  DAILY (Patient taking differently: Take 2.5 mg by mouth daily. ) 15 tablet 0 12/15/2018 at Unknown time  . aspirin EC 81 MG tablet Take 1 tablet (81 mg total) by mouth daily. Or Enteric Coated (EC) 81 mg   12/15/2018 at Unknown time  . bisacodyl (DULCOLAX) 5 MG EC tablet Take 5 mg by mouth daily as needed for mild constipation or moderate constipation.   unknown  . docusate sodium (COLACE) 100 MG capsule Take 100 mg by mouth daily as needed for  mild constipation.   unknown  . finasteride (PROSCAR) 5 MG tablet Take 5 mg by mouth every morning.    12/15/2018 at Unknown time  . gabapentin (NEURONTIN) 800 MG tablet Take 1 tablet (800 mg total) by mouth 3 (three) times daily. 270 tablet 0 12/15/2018 at Unknown time  . guaiFENesin (MUCINEX) 600 MG 12 hr tablet Take 600 mg by mouth 2 (two) times daily.    12/15/2018 at Unknown time  . latanoprost (XALATAN) 0.005 % ophthalmic solution  Place 1 drop into both eyes at bedtime.   12/14/2018 at Unknown time  . metoprolol tartrate (LOPRESSOR) 25 MG tablet Take 12.5 mg by mouth 2 (two) times daily.   12/15/2018 at 12-998  . nitroGLYCERIN (NITROSTAT) 0.4 MG SL tablet Place 1 tablet (0.4 mg total) under the tongue every 5 (five) minutes x 3 doses as needed for chest pain. 25 tablet 4 unknown  . tamsulosin (FLOMAX) 0.4 MG CAPS capsule Take 0.4 mg by mouth at bedtime.   12/14/2018 at Unknown time   Scheduled: . alprazolam  2 mg Oral BID  . amitriptyline  100 mg Oral QHS  . amLODipine  2.5 mg Oral Daily  . aspirin EC  81 mg Oral Daily  . atorvastatin  40 mg Oral q1800  . finasteride  5 mg Oral Daily  . gabapentin  800 mg Oral TID  . guaiFENesin  600 mg Oral BID  . heparin injection (subcutaneous)  5,000 Units Subcutaneous Q8H  . ipratropium-albuterol  3 mL Nebulization Q6H  . latanoprost  1 drop Both Eyes QHS  . methylPREDNISolone (SOLU-MEDROL) injection  40 mg Intravenous Q12H  . metoprolol tartrate  12.5 mg Oral BID  . tamsulosin  0.4 mg Oral QHS   Continuous: . sodium chloride 75 mL/hr at 12/16/18 2334  . sodium chloride    . azithromycin 500 mg (12/16/18 2119)  . cefTRIAXone (ROCEPHIN)  IV 1 g (12/16/18 2005)   UKG:URKYHC chloride, acetaminophen **OR** acetaminophen, bisacodyl, hydrALAZINE, ondansetron **OR** ondansetron (ZOFRAN) IV  Assesment: He was admitted with sepsis related to pneumonia.  He has resolved sepsis pathophysiology and is doing much better.  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  He has acute hypoxic respiratory failure initially requiring BiPAP but now on 4 L nasal cannula with good oxygenation Active Problems:   Sepsis ([place])    Plan: Continue treatments.  Discussed with Dr. Manuella Ghazi.  He plans to move him to the floor.    LOS: 1 day   Alonza Bogus 12/17/2018,  7:54 AM

## 2018-12-18 LAB — BASIC METABOLIC PANEL
Anion gap: 8 (ref 5–15)
BUN: 28 mg/dL — ABNORMAL HIGH (ref 8–23)
CO2: 26 mmol/L (ref 22–32)
Calcium: 8.6 mg/dL — ABNORMAL LOW (ref 8.9–10.3)
Chloride: 101 mmol/L (ref 98–111)
Creatinine, Ser: 0.95 mg/dL (ref 0.61–1.24)
GFR calc Af Amer: >60 mL/min (ref >60)
GFR calc non Af Amer: >60 mL/min (ref >60)
Glucose, Bld: 125 mg/dL — ABNORMAL HIGH (ref 70–99)
Potassium: 4.9 mmol/L (ref 3.5–5.1)
Sodium: 135 mmol/L (ref 135–145)

## 2018-12-18 LAB — CBC
HCT: 42.6 % (ref 39.0–52.0)
Hemoglobin: 12.8 g/dL — ABNORMAL LOW (ref 13.0–17.0)
MCH: 28 pg (ref 26.0–34.0)
MCHC: 30 g/dL (ref 30.0–36.0)
MCV: 93.2 fL (ref 80.0–100.0)
Platelets: 234 10*3/uL (ref 150–400)
RBC: 4.57 MIL/uL (ref 4.22–5.81)
RDW: 18.9 % — ABNORMAL HIGH (ref 11.5–15.5)
WBC: 9.1 10*3/uL (ref 4.0–10.5)
nRBC: 0.2 % (ref 0.0–0.2)

## 2018-12-18 LAB — LACTIC ACID, PLASMA: Lactic Acid, Venous: 1.4 mmol/L (ref 0.5–1.9)

## 2018-12-18 MED ORDER — AZITHROMYCIN 250 MG PO TABS
500.0000 mg | ORAL_TABLET | Freq: Every day | ORAL | Status: DC
  Administered 2018-12-18: 500 mg via ORAL
  Filled 2018-12-18: qty 2

## 2018-12-18 NOTE — Plan of Care (Signed)
  Problem: Acute Rehab PT Goals(only PT should resolve) Goal: Pt will Roll Supine to Side Outcome: Progressing Flowsheets (Taken 12/18/2018 1452) Pt will Roll Supine to Side: with modified independence Goal: Pt Will Go Supine/Side To Sit Outcome: Progressing Flowsheets (Taken 12/18/2018 1452) Pt will go Supine/Side to Sit: with supervision Goal: Pt Will Go Sit To Supine/Side Outcome: Progressing Flowsheets (Taken 12/18/2018 1452) Pt will go Sit to Supine/Side: with supervision Goal: Patient Will Transfer Sit To/From Stand Outcome: Progressing Flowsheets (Taken 12/18/2018 1452) Patient will transfer sit to/from stand: with min guard assist Goal: Pt Will Transfer Bed To Chair/Chair To Bed Outcome: Progressing Flowsheets (Taken 12/18/2018 1452) Pt will Transfer Bed to Chair/Chair to Bed: min guard assist Goal: Pt Will Ambulate Outcome: Progressing Flowsheets (Taken 12/18/2018 1452) Pt will Ambulate:  75 feet  with min guard assist  with rolling walker Goal: Pt Will Go Up/Down Stairs Outcome: Progressing Flowsheets (Taken 12/18/2018 1452) Pt will Go Up / Down Stairs:  3-5 stairs  with rail(s)  with min guard assist    Pamala Hurry D. Hartnett-Rands, MS, PT Per [place] [phone] 12/18/2018

## 2018-12-18 NOTE — Progress Notes (Signed)
Foley removed at 1420.  Has not voided.  Scan showed 25 mls.

## 2018-12-18 NOTE — Progress Notes (Signed)
PHARMACIST - PHYSICIAN COMMUNICATION DR:   Manuella Ghazi CONCERNING: Antibiotic-IV- to Oral Route Change Policy  RECOMMENDATION: This patient is receiving azithromycin by the intravenous route.  Based on criteria approved by the Pharmacy and Therapeutics Committee, the antibiotic(s) is/are being converted to the equivalent oral dose form(s).   DESCRIPTION: These criteria include:  Patient being treated for a respiratory tract infection, urinary tract infection, cellulitis or clostridium difficile associated diarrhea if on metronidazole  The patient is not neutropenic and does not exhibit a GI malabsorption state  The patient is eating (either orally or via tube) and/or has been taking other orally administered medications for a least 24 hours  The patient is improving clinically and has a Tmax < 100.5  If you have questions about this conversion, please contact the Pharmacy Department  [x]   [phone] )  Curtis [name] []   [phone] )  Curtis [name]  []   [phone] )  [facility] []   [phone] )  [place], [place]. D. Clinical Pharmacist 12/18/2018 1:59 PM

## 2018-12-18 NOTE — Progress Notes (Signed)
PROGRESS NOTE    Curtis [name]  OEV:[id number] DOB: 04-Mar-1946 DOA: 12/15/2018 PCP: Curtis Sacramento, MD   Brief Narrative:  Per HPI: Curtis Cipro Malloyis a 73 y.o.malewith medical history significant ofCOPD, pulmonary asbestosis, peripheral vascular disease, arthritis, hypertension, coronary artery disease, who comes to the hospital with chief complaint of weakness and shortness of breath. Patient's is on BiPAP and story is mostly contributed by son. He tells me that his dad called him yesterday that he fell on the floor and was too weak to get up,and that happened on 2 separate occasions in the morning as well as at lunchtime. Patient stated that he fell on his right knee. Today, patient called his son telling him that he cannot breathe. When son arrived, the patient was having difficulties breathing and son noticed that his lips were blue and decided to call 911. Patient tells me that his breathing is improved since he has been on BiPAP. Currently denies any chest pain, denies any abdominal pain, denies any nausea or vomiting. Denies any sore throat, but did have chest congestion patient himself thought he has pneumonia.  Patient was admitted with sepsis secondary to community-acquired pneumonia and has been started on Rocephin and azithromycin. Discussed with pulmonology and will add IV steroids at this point to and maintain on breathing treatments and wean off BiPAP as tolerated. He is also noted to have AKI with some mild hyperkalemia.  8/14: Patient appears to be doing much better this morning and has remained off BiPAP and is currently on 4 L nasal cannula.  He has some elevated blood pressure readings.  Laboratory data demonstrates improvement on azithromycin and Rocephin.  He is also on IV steroids.  8/15: Patient does have frequent falls at home and will require PT evaluation.  I believe he is stable for PT evaluation at this time.  We will also wean off Foley catheter and  see how he does without it.  Anticipate discharge in the next 24 to 48 hours with home health services versus possible need for inpatient rehab.  Assessment & Plan:   Active Problems:   Sepsis ([place])   Acute combined respiratory failure secondary to sepsis from CAP with underlying COPD/asbestosis-improving -Continue weaning from 4 L nasal cannula -Appreciate further pulmonology recommendations for follow-up in outpatient setting once discharged -Maintain on breathing treatments and IV steroids for now and will discharge on oral prednisone when ready -Maintain on azithromycin and Rocephin with cultures pending; plan to discharge on Augmentin when ready  AKI with hyperkalemia-improving -Responded well to IV fluid and will hold for now -Repeat renal panel in a.m.  Weakness with recurrent falls -PT evaluation appreciated  Urinary retention -We will try to discontinue Foley catheter today and see how well he does  Hypertension- borderline controlled -Resume home medications -Hydralazine as needed  Depression/anxiety -Continue amitriptyline and resume Xanax as needed  CAD -Continue aspirin and Lipitor  BPH -Continue finasteride and tamsulosin   DVT prophylaxis: Heparin Code Status:DNI Family Communication:Discussed with son on phone Disposition Plan:  Continue to monitor closely on current treatments with PT evaluation pending.  We will try to discontinue Foley catheter and see how he does.  Anticipate discharge in 24 to 48 hours after PT evaluation.   Consultants:  Pulmonology  Procedures:  None  Antimicrobials:  Anti-infectives (From admission, onward)   Start     Dose/Rate Route Frequency Ordered Stop   12/15/18 2000  azithromycin (ZITHROMAX) 500 mg in sodium chloride 0.9 % 250 mL  IVPB     500 mg 250 mL/hr over 60 Minutes Intravenous Every 24 hours 12/15/18 1848     12/15/18 1930  cefTRIAXone (ROCEPHIN) 1 g in sodium chloride 0.9 % 100 mL IVPB      1 g 200 mL/hr over 30 Minutes Intravenous Every 24 hours 12/15/18 1847     12/15/18 1700  vancomycin (VANCOCIN) IVPB 1000 mg/200 mL premix     1,000 mg 200 mL/hr over 60 Minutes Intravenous  Once 12/15/18 1552 12/15/18 1841   12/15/18 1600  vancomycin (VANCOCIN) IVPB 1000 mg/200 mL premix     1,000 mg 200 mL/hr over 60 Minutes Intravenous  Once 12/15/18 1552 12/15/18 1740   12/15/18 1600  ceFEPIme (MAXIPIME) 2 g in sodium chloride 0.9 % 100 mL IVPB     2 g 200 mL/hr over 30 Minutes Intravenous  Once 12/15/18 1556 12/15/18 1734   12/15/18 1530  ceFEPIme (MAXIPIME) 1 g in sodium chloride 0.9 % 100 mL IVPB  Status:  Discontinued     1 g 200 mL/hr over 30 Minutes Intravenous  Once 12/15/18 1527 12/15/18 1557   12/15/18 1530  vancomycin (VANCOCIN) 2,000 mg in sodium chloride 0.9 % 500 mL IVPB  Status:  Discontinued     2,000 mg 250 mL/hr over 120 Minutes Intravenous  Once 12/15/18 1527 12/15/18 1552       Subjective: Patient seen and evaluated today with no new acute complaints or concerns. No acute concerns or events noted overnight.  He is doing well on 4 L nasal cannula with no respiratory distress noted.  Foley catheter with clear, yellow urine output noted.  He complains of significant weakness and recurrent falls at home.  Objective: Vitals:   12/17/18 2145 12/18/18 0204 12/18/18 0535 12/18/18 0901  BP: (!) 163/69  (!) 159/76   Pulse: 91  72   Resp: 18  18   Temp: 98.1 F (36.7 C)  (!) 97.4 F (36.3 C)   TempSrc: Oral  Oral   SpO2: 93% 94% 97% 93%  Weight:      Height:        Intake/Output Summary (Last 24 hours) at 12/18/2018 1239 Last data filed at 12/18/2018 1002 Gross per 24 hour  Intake 465 ml  Output 1800 ml  Net -1335 ml   Filed Weights   12/15/18 1518 12/16/18 1040 12/17/18 0500  Weight: 98 kg 94.4 kg 94.1 kg    Examination:  General exam: Appears calm and comfortable  Respiratory system: Clear to auscultation. Respiratory effort normal.  Currently on 4  L nasal cannula.  No wheezing noted Cardiovascular system: S1 & S2 heard, RRR. No JVD, murmurs, rubs, gallops or clicks. No pedal edema. Gastrointestinal system: Abdomen is nondistended, soft and nontender. No organomegaly or masses felt. Normal bowel sounds heard. Central nervous system: Alert and oriented. No focal neurological deficits. Extremities: Symmetric 5 x 5 power. Skin: No rashes, lesions or ulcers Psychiatry: Judgement and insight appear normal. Mood & affect appropriate.  Foley catheter with clear, yellow urine output noted    Data Reviewed: I have personally reviewed following labs and imaging studies  CBC: Recent Labs  Lab 12/15/18 1551 12/16/18 0330 12/17/18 0459 12/18/18 0725  WBC 15.0* 11.6* 8.5 9.1  NEUTROABS 12.4*  --   --   --   HGB 14.9 13.2 12.8* 12.8*  HCT 50.2 44.7 43.0 42.6  MCV 92.8 93.9 93.7 93.2  PLT 260 199 196 176   Basic Metabolic Panel: Recent Labs  Lab  12/15/18 1551 12/16/18 0330 12/17/18 0459 12/18/18 0725  NA 134* 132* 133* 135  K 5.3* 5.6* 5.1 4.9  CL 94* 99 99 101  CO2 27 27 26 26   GLUCOSE 138* 141* 145* 125*  BUN 24* 29* 27* 28*  CREATININE 2.05* 1.90* 1.12 0.95  CALCIUM 9.1 7.9* 8.4* 8.6*   GFR: Estimated Creatinine Clearance: 81.2 mL/min (by C-G formula based on SCr of 0.95 mg/dL). Liver Function Tests: Recent Labs  Lab 12/15/18 1551 12/16/18 0330  AST 20 13*  ALT 11 10  ALKPHOS 80 69  BILITOT 0.7 0.5  PROT 8.1 6.7  ALBUMIN 3.5 2.7*   No results for input(s): LIPASE, AMYLASE in the last 168 hours. No results for input(s): AMMONIA in the last 168 hours. Coagulation Profile: No results for input(s): INR, PROTIME in the last 168 hours. Cardiac Enzymes: No results for input(s): CKTOTAL, CKMB, CKMBINDEX, TROPONINI in the last 168 hours. BNP (last 3 results) No results for input(s): PROBNP in the last 8760 hours. HbA1C: No results for input(s): HGBA1C in the last 72 hours. CBG: No results for input(s): GLUCAP in  the last 168 hours. Lipid Profile: No results for input(s): CHOL, HDL, LDLCALC, TRIG, CHOLHDL, LDLDIRECT in the last 72 hours. Thyroid Function Tests: No results for input(s): TSH, T4TOTAL, FREET4, T3FREE, THYROIDAB in the last 72 hours. Anemia Panel: No results for input(s): VITAMINB12, FOLATE, FERRITIN, TIBC, IRON, RETICCTPCT in the last 72 hours. Sepsis Labs: Recent Labs  Lab 12/15/18 1551 12/15/18 1725 12/17/18 0459 12/18/18 0725  LATICACIDVEN 3.4* 3.5* 1.7 1.4    Recent Results (from the past 240 hour(s))  SARS Coronavirus 2 [facility] order, Performed in [facility] hospital lab) Nasopharyngeal Nasopharyngeal Swab     Status: None   Collection Time: 12/15/18  3:27 PM   Specimen: Nasopharyngeal Swab  Result Value Ref Range Status   SARS Coronavirus 2 NEGATIVE NEGATIVE Final    Comment: (NOTE) If result is NEGATIVE SARS-CoV-2 target nucleic acids are NOT DETECTED. The SARS-CoV-2 RNA is generally detectable in upper and lower  respiratory specimens during the acute phase of infection. The lowest  concentration of SARS-CoV-2 viral copies this assay can detect is 250  copies / mL. A negative result does not preclude SARS-CoV-2 infection  and should not be used as the sole basis for treatment or other  patient management decisions.  A negative result may occur with  improper specimen collection / handling, submission of specimen other  than nasopharyngeal swab, presence of viral mutation(s) within the  areas targeted by this assay, and inadequate number of viral copies  (<250 copies / mL). A negative result must be combined with clinical  observations, patient history, and epidemiological information. If result is POSITIVE SARS-CoV-2 target nucleic acids are DETECTED. The SARS-CoV-2 RNA is generally detectable in upper and lower  respiratory specimens dur ing the acute phase of infection.  Positive  results are indicative of active infection with SARS-CoV-2.  Clinical   correlation with patient history and other diagnostic information is  necessary to determine patient infection status.  Positive results do  not rule out bacterial infection or co-infection with other viruses. If result is PRESUMPTIVE POSTIVE SARS-CoV-2 nucleic acids MAY BE PRESENT.   A presumptive positive result was obtained on the submitted specimen  and confirmed on repeat testing.  While 2019 novel coronavirus  (SARS-CoV-2) nucleic acids may be present in the submitted sample  additional confirmatory testing may be necessary for epidemiological  and / or clinical management purposes  to differentiate between  SARS-CoV-2 and other Sarbecovirus currently known to infect humans.  If clinically indicated additional testing with an alternate test  methodology [phone]) is advised. The SARS-CoV-2 RNA is generally  detectable in upper and lower respiratory sp ecimens during the acute  phase of infection. The expected result is Negative. Fact Sheet for Patients:  StrictlyIdeas.no Fact Sheet for Healthcare Providers: BankingDealers.co.za This test is not yet approved or cleared by the [place] FDA and has been authorized for detection and/or diagnosis of SARS-CoV-2 by FDA under an Emergency Use Authorization (EUA).  This EUA will remain in effect (meaning this test can be used) for the duration of the COVID-19 declaration under Section 564(b)(1) of the Act, 21 U.S.C. section 360bbb-3(b)(1), unless the authorization is terminated or revoked sooner. Performed at [facility], [address], [place], [place] [address]   Blood culture (routine x 2)     Status: None (Preliminary result)   Collection Time: 12/15/18  3:51 PM   Specimen: BLOOD RIGHT WRIST  Result Value Ref Range Status   Specimen Description BLOOD RIGHT WRIST  Final   Special Requests   Final    BOTTLES DRAWN AEROBIC AND ANAEROBIC Blood Culture adequate volume   Culture    Final    NO GROWTH 3 DAYS Performed at [facility], [address] [place], [place] [address]    Report Status PENDING  Incomplete  Blood culture (routine x 2)     Status: None (Preliminary result)   Collection Time: 12/15/18  3:52 PM   Specimen: Left Antecubital; Blood  Result Value Ref Range Status   Specimen Description LEFT ANTECUBITAL  Final   Special Requests   Final    BOTTLES DRAWN AEROBIC AND ANAEROBIC Blood Culture results may not be optimal due to an excessive volume of blood received in culture bottles   Culture   Final    NO GROWTH 3 DAYS Performed at [facility], [address] [place], [place] [address]    Report Status PENDING  Incomplete  MRSA PCR Screening     Status: None   Collection Time: 12/16/18 10:29 AM   Specimen: Nasal Mucosa; Nasopharyngeal  Result Value Ref Range Status   MRSA by PCR NEGATIVE NEGATIVE Final    Comment:        The GeneXpert MRSA Assay (FDA approved for NASAL specimens only), is one component of a comprehensive MRSA colonization surveillance program. It is not intended to diagnose MRSA infection nor to guide or monitor treatment for MRSA infections. Performed at [facility], [address], [place], [place] [address]          Radiology Studies: No results found.      Scheduled Meds: . alprazolam  2 mg Oral BID  . amitriptyline  100 mg Oral QHS  . amLODipine  2.5 mg Oral Daily  . aspirin EC  81 mg Oral Daily  . atorvastatin  40 mg Oral q1800  . finasteride  5 mg Oral Daily  . gabapentin  800 mg Oral TID  . guaiFENesin  600 mg Oral BID  . heparin injection (subcutaneous)  5,000 Units Subcutaneous Q8H  . ipratropium-albuterol  3 mL Nebulization Q6H  . latanoprost  1 drop Both Eyes QHS  . methylPREDNISolone (SOLU-MEDROL) injection  40 mg Intravenous Q12H  . metoprolol tartrate  12.5 mg Oral BID  . tamsulosin  0.4 mg Oral QHS   Continuous Infusions: . sodium chloride    . azithromycin 500 mg (12/17/18 2254)  .  cefTRIAXone (ROCEPHIN)  IV 1 g (12/17/18 1816)     LOS: 2 days    Time spent: 30 minutes    Ido Wollman Darleen Crocker, DO [facility] Pager [phone]  If 7PM-7AM, please contact night-coverage [email or web address] Password [facility] 12/18/2018, 12:39 PM

## 2018-12-18 NOTE — Evaluation (Signed)
Physical Therapy Evaluation Patient Details Name: Curtis [name] MRN: [id number] DOB: April 24, 1946 Today's Date: 12/18/2018   History of Present Illness  Curtis [name] is a 73 y.o. male with medical history significant of COPD, pulmonary asbestosis, peripheral vascular disease, arthritis, hypertension, coronary artery disease, peripheral neuropathy, who comes to the hospital with chief complaint of weakness and shortness of breath.  Was requiring BiPAP, now on 4 L O2. Reports several falls recently.    Clinical Impression  Pt admitted with above diagnosis. Patient agreeable to participating in therapy evaluation. Patient reports he was not on home oxygen before admission. Patient reports a weird feeling came over him before he fell at home. Patient functioning near baseline but reports several falls at home prior to this admission. Patient is somewhat unsteady on his feet and exhibits decreased strength and endurance. Pt currently with functional limitations due to the deficits listed below (see PT Problem List). Pt will benefit from skilled PT to increase their independence and safety with mobility to allow discharge to the venue listed below.       Follow Up Recommendations Home health PT;Supervision - Intermittent    Equipment Recommendations  Rolling walker with 5" wheels    Recommendations for Other Services       Precautions / Restrictions Precautions Precautions: Fall Restrictions Weight Bearing Restrictions: No      Mobility  Bed Mobility Overal bed mobility: Needs Assistance Bed Mobility: Rolling;Sidelying to Sit Rolling: Supervision Sidelying to sit: HOB elevated;Supervision          Transfers Overall transfer level: Needs assistance Equipment used: Rolling walker (2 wheeled) Transfers: Sit to/from [facility] Sit to Stand: Min guard Stand pivot transfers: Min guard       General transfer comment: additional time on sit to stands to  generate sufficient power up, x3 attempts  Ambulation/Gait Ambulation/Gait assistance: Min guard Gait Distance (Feet): 60 Feet Assistive device: Rolling walker (2 wheeled) Gait Pattern/deviations: Step-through pattern;Decreased step length - right;Decreased step length - left;Decreased stride length Gait velocity: decreased   General Gait Details: slow labored gait with fairly short steps, limited by pain and fatigue  Stairs            Wheelchair Mobility    Modified Rankin (Stroke Patients Only)       Balance Overall balance assessment: Mild deficits observed, not formally tested                                           Pertinent Vitals/Pain Pain Assessment: 0-10 Pain Score: 6  Pain Location: "all over all the time. Moves around but always there." Pain Intervention(s): Limited activity within patient's tolerance;Monitored during session    [place] expects to be discharged to:: Private residence Living Arrangements: Alone Available Help at Discharge: Family;Available PRN/intermittently Type of Home: Mobile home Home Access: Stairs to enter Entrance Stairs-Rails: Can reach both;Left;Right Entrance Stairs-Number of Steps: 3 Home Layout: One level Home Equipment: Toilet riser;Grab bars - toilet;Walker - standard;Cane - single point;Bedside commode;Shower seat;Grab bars - tub/shower      Prior Function Level of Independence: Independent with assistive device(s)               Hand Dominance   Dominant Hand: Right    Extremity/Trunk Assessment   Upper Extremity Assessment Upper Extremity Assessment: Generalized weakness    Lower Extremity Assessment Lower Extremity Assessment:  Generalized weakness    Cervical / Trunk Assessment Cervical / Trunk Assessment: Kyphotic  Communication   Communication: No difficulties  Cognition Arousal/Alertness: Awake/alert Behavior During Therapy: WFL for tasks  assessed/performed Overall Cognitive Status: Within Functional Limits for tasks assessed                                        General Comments      Exercises     Assessment/Plan    PT Assessment Patient needs continued PT services  PT Problem List Decreased strength;Decreased mobility;Decreased activity tolerance;Decreased balance;Pain;Decreased knowledge of use of DME       PT Treatment Interventions      PT Goals (Current goals can be found in the Care Plan section)  Acute Rehab PT Goals Patient Stated Goal: Go home with the help of his son like before hospital. PT Goal Formulation: With patient Time For Goal Achievement: 01/01/19 Potential to Achieve Goals: Fair    Frequency Min 3X/week   Barriers to discharge        Co-evaluation               AM-PAC PT "6 Clicks" Mobility  Outcome Measure Help needed turning from your back to your side while in a flat bed without using bedrails?: A Little Help needed moving from lying on your back to sitting on the side of a flat bed without using bedrails?: A Little Help needed moving to and from a bed to a chair (including a wheelchair)?: A Little Help needed standing up from a chair using your arms (e.g., wheelchair or bedside chair)?: A Little Help needed to walk in hospital room?: A Little Help needed climbing 3-5 steps with a railing? : A Little 6 Click Score: 18    End of Session Equipment Utilized During Treatment: Gait belt;Oxygen Activity Tolerance: Patient limited by pain;Patient limited by fatigue Patient left: in chair;with call bell/phone within reach;with chair alarm set Nurse Communication: Mobility status PT Visit Diagnosis: Unsteadiness on feet (R26.81);Repeated falls (R29.6);Other abnormalities of gait and mobility (R26.89);Muscle weakness (generalized) (M62.81)    Time: 1410-1440 PT Time Calculation (min) (ACUTE ONLY): 30 min   Charges:   PT Evaluation $PT Eval Low Complexity:  1 Low PT Treatments $Gait Training: 8-22 mins        Floria Raveling. Hartnett-Rands, MS, PT Per [place] #[id number] 12/18/2018, 2:48 PM

## 2018-12-19 LAB — BASIC METABOLIC PANEL
Anion gap: 8 (ref 5–15)
BUN: 30 mg/dL — ABNORMAL HIGH (ref 8–23)
CO2: 28 mmol/L (ref 22–32)
Calcium: 8.9 mg/dL (ref 8.9–10.3)
Chloride: 99 mmol/L (ref 98–111)
Creatinine, Ser: 1.02 mg/dL (ref 0.61–1.24)
GFR calc Af Amer: >60 mL/min (ref >60)
GFR calc non Af Amer: >60 mL/min (ref >60)
Glucose, Bld: 131 mg/dL — ABNORMAL HIGH (ref 70–99)
Potassium: 5 mmol/L (ref 3.5–5.1)
Sodium: 135 mmol/L (ref 135–145)

## 2018-12-19 LAB — CBC
HCT: 42.8 % (ref 39.0–52.0)
Hemoglobin: 13 g/dL (ref 13.0–17.0)
MCH: 28.1 pg (ref 26.0–34.0)
MCHC: 30.4 g/dL (ref 30.0–36.0)
MCV: 92.4 fL (ref 80.0–100.0)
Platelets: 263 10*3/uL (ref 150–400)
RBC: 4.63 MIL/uL (ref 4.22–5.81)
RDW: 19.2 % — ABNORMAL HIGH (ref 11.5–15.5)
WBC: 8.4 10*3/uL (ref 4.0–10.5)
nRBC: 0.2 % (ref 0.0–0.2)

## 2018-12-19 MED ORDER — ATORVASTATIN CALCIUM 40 MG PO TABS: 40.0000 mg | ORAL_TABLET | Freq: Every day | ORAL | 0 refills | Status: DC

## 2018-12-19 MED ORDER — AMOXICILLIN-POT CLAVULANATE 875-125 MG PO TABS: 1.0000 | ORAL_TABLET | Freq: Two times a day (BID) | ORAL | 0 refills | Status: AC

## 2018-12-19 MED ORDER — ALBUTEROL SULFATE HFA 108 (90 BASE) MCG/ACT IN AERS: 2.0000 | INHALATION_SPRAY | Freq: Four times a day (QID) | RESPIRATORY_TRACT | 2 refills | Status: DC | PRN

## 2018-12-19 MED ORDER — BUDESONIDE-FORMOTEROL FUMARATE 80-4.5 MCG/ACT IN AERO: 2.0000 | INHALATION_SPRAY | Freq: Every morning | RESPIRATORY_TRACT | 12 refills | Status: DC

## 2018-12-19 MED ORDER — PREDNISONE 20 MG PO TABS: 40.0000 mg | ORAL_TABLET | Freq: Every day | ORAL | 0 refills | Status: AC

## 2018-12-19 NOTE — Progress Notes (Addendum)
IV removed and discharge instructions reviewed.  O2 concentrator will be delivered to home.  Family to give ride home.  Spoke with [facility] delivery driver and he will be at the home in about an hour.

## 2018-12-19 NOTE — Progress Notes (Signed)
SATURATION QUALIFICATIONS: (This note is used to comply with regulatory documentation for home oxygen)  Patient Saturations on Room Air at Rest = 78%  Patient Saturations on Room Air while Ambulating = 74%  Patient Saturations on 8 Liters of oxygen while Ambulating = 91%  Please briefly explain why patient needs home oxygen:

## 2018-12-19 NOTE — TOC Transition Note (Addendum)
Transition of Care [facility]) - CM/SW Discharge Note   Patient Details  Name: Curtis [name] MRN: [id number] Date of Birth: 07/23/1945  [facility] CM/SW Contact:  Curtis Maudlin, RN Phone Number: 12/19/2018, 10:43 AM   Clinical Narrative:  Patient to be discharged per MD order. Orders in place for home health services. [facility], patient has used them in the past. Previous TOC member had set up O2 through [place]. Patient has received a tank at bedside and is set for delivery for remaining supplies once he returns home. Per Curtis [name] there is a number to call for home set up. I have given this to the bedside RN to give to the patient to call once he leaves. Per [facility] we need updated SATS note and bedside RN will document this.   Update 12:10- patients O2 saturations are now requiring 8L O2 to maintain above 90%. The MD feels comfortable proceeding with discharge as stated by the bedside RN. Curtis [name] with [employer] has a concentrator that can reach 10L. Current plan is to discharge patient with the portable tank that is at bedside and for [facility] to drop off concentrator. I have given the patient and bedside RN the number that needs to be called to get [facility] to the home. Per bedside RN she will call or observe family call that number prior to leaving the hospital so we can ensure the oxygen is in the home or will be in the home by the time patient arrives.     Final next level of care: Home w Home Health Services Barriers to Discharge: No Barriers Identified   Patient Goals and CMS Choice   CMS Medicare.gov Compare Post Acute Care list provided to:: Patient Choice offered to / list presented to : Patient  Discharge Placement                       Discharge Plan and Services                DME Arranged: Walker rolling, Oxygen DME Agency: [facility] Date DME Agency Contacted: 12/19/18 Time DME Agency Contacted: 77 Representative spoke with at DME  Agency: Curtis [name] HH Arranged: RN, PT, Nurse's Aide [place] Agency: [place] ([place]) Date [place]: 12/19/18 Time [place]: [id number] Representative spoke with at [place]: [place] ([place]) Interventions     Readmission Risk Interventions Readmission Risk Prevention Plan 12/19/2018  Transportation Screening Complete  PCP or Specialist Appt within 5-7 Days Complete  Home Care Screening Complete  Medication Review (RN CM) Complete  Some recent data might be hidden

## 2018-12-19 NOTE — Discharge Summary (Addendum)
Physician Discharge Summary  Curtis [name] KNL:[id number] DOB: 12-18-1945 DOA: 12/15/2018  PCP: Curtis Sacramento, MD  Admit date: 12/15/2018  Discharge date: 12/19/2018  Admitted From:Home  Disposition:  Home  Recommendations for Outpatient Follow-up:  1. Follow up with PCP in 1-2 weeks 2. Follow-up with Dr. Luan Pulling in 2 weeks and call office to schedule visit and continue to wear home oxygen 4 L at rest and up to 8 L with ambulation.  He will need high flow nasal cannula with concentrator upon discharge to assist with his home oxygen needs. 3. Continue on prednisone as prescribed for 5 days 4. Continue on Augmentin as prescribed to complete course of treatment for the next 3 days 5. Continue on Symbicort as prescribed daily 6. Use home inhaler as prescribed as needed for shortness of breath or wheezing  Home Health: Yes with PT  Equipment/Devices: Rolling walker, oxygen 4 L nasal cannula  Discharge Condition: Stable  CODE STATUS: DNI  Diet recommendation: Heart Healthy  Brief/Interim Summary: Per HPI: Curtis D Malloyis a 73 y.o.malewith medical history significant ofCOPD, pulmonary asbestosis, peripheral vascular disease, arthritis, hypertension, coronary artery disease, who comes to the hospital with chief complaint of weakness and shortness of breath. Patient's is on BiPAP and story is mostly contributed by son. He tells me that his dad called him yesterday that he fell on the floor and was too weak to get up,and that happened on 2 separate occasions in the morning as well as at lunchtime. Patient stated that he fell on his right knee. Today, patient called his son telling him that he cannot breathe. When son arrived, the patient was having difficulties breathing and son noticed that his lips were blue and decided to call 911. Patient tells me that his breathing is improved since he has been on BiPAP. Currently denies any chest pain, denies any abdominal pain, denies any  nausea or vomiting. Denies any sore throat, but did have chest congestion patient himself thought he has pneumonia.  Patient was admitted with sepsis secondary to community-acquired pneumonia and has been started on Rocephin and azithromycin. Discussed with pulmonology and will add IV steroids at this point to and maintain on breathing treatments and wean off BiPAP as tolerated. He is also noted to have AKI with some mild hyperkalemia.  8/14:Patient appears to be doing much better this morning and has remained off BiPAP and is currently on 4 L nasal cannula. He has some elevated blood pressure readings. Laboratory data demonstrates improvement on azithromycin and Rocephin. He is also on IV steroids.  8/15: Patient does have frequent falls at home and will require PT evaluation.  I believe he is stable for PT evaluation at this time.  We will also wean off Foley catheter and see how he does without it.  Anticipate discharge in the next 24 to 48 hours with home health services versus possible need for inpatient rehab.  8/16: Foley catheter had been removed on 8/15 and patient is urinating on his own without any difficulty.  PT has assessed patient with recommendations for home health and rolling walker.  He is stable for discharge today and will be transitioned to oral prednisone as well as oral Augmentin to complete course of treatment.  He will follow-up with pulmonology in the outpatient setting as noted above.  He will need to remain on 4 L nasal cannula at home.  Discharge Diagnoses:  Active Problems:   Sepsis [facility])  Principal discharge diagnoses: Acute combined respiratory failure  secondary to sepsis from community-acquired pneumonia with underlying COPD/asbestosis.  Discharge Instructions  Discharge Instructions    Diet - low sodium heart healthy   Complete by: As directed    Increase activity slowly   Complete by: As directed      Allergies as of 12/19/2018      Reactions    Codeine Itching   Trileptal [oxcarbazepine]    Other reaction(s): Other (See Comments) Abnormal sodium levels Hyponatremia      Medication List    TAKE these medications   albuterol 108 (90 Base) MCG/ACT inhaler Commonly known as: VENTOLIN HFA Inhale 2 puffs into the lungs every 6 (six) hours as needed for wheezing or shortness of breath.   alprazolam 2 MG tablet Commonly known as: XANAX Take 1 tablet (2 mg total) by mouth 2 (two) times daily.   amitriptyline 100 MG tablet Commonly known as: ELAVIL Take 100 mg by mouth at bedtime.   amLODipine 2.5 MG tablet Commonly known as: NORVASC TAKE 1 TABLET BY MOUTH  DAILY   amoxicillin-clavulanate 875-125 MG tablet Commonly known as: Augmentin Take 1 tablet by mouth 2 (two) times daily for 3 days.   aspirin EC 81 MG tablet Take 1 tablet (81 mg total) by mouth daily. Or Enteric Coated (EC) 81 mg   atorvastatin 40 MG tablet Commonly known as: LIPITOR Take 1 tablet (40 mg total) by mouth daily at 6 PM. What changed: additional instructions   bisacodyl 5 MG EC tablet Commonly known as: DULCOLAX Take 5 mg by mouth daily as needed for mild constipation or moderate constipation.   budesonide-formoterol 80-4.5 MCG/ACT inhaler Commonly known as: Symbicort Inhale 2 puffs into the lungs every morning.   docusate sodium 100 MG capsule Commonly known as: COLACE Take 100 mg by mouth daily as needed for mild constipation.   finasteride 5 MG tablet Commonly known as: PROSCAR Take 5 mg by mouth every morning.   gabapentin 800 MG tablet Commonly known as: NEURONTIN Take 1 tablet (800 mg total) by mouth 3 (three) times daily.   guaiFENesin 600 MG 12 hr tablet Commonly known as: MUCINEX Take 600 mg by mouth 2 (two) times daily.   latanoprost 0.005 % ophthalmic solution Commonly known as: XALATAN Place 1 drop into both eyes at bedtime.   metoprolol tartrate 25 MG tablet Commonly known as: LOPRESSOR Take 12.5 mg by mouth 2 (two)  times daily.   nitroGLYCERIN 0.4 MG SL tablet Commonly known as: NITROSTAT Place 1 tablet (0.4 mg total) under the tongue every 5 (five) minutes x 3 doses as needed for chest pain.   predniSONE 20 MG tablet Commonly known as: Deltasone Take 2 tablets (40 mg total) by mouth daily for 5 days.   tamsulosin 0.4 MG Caps capsule Commonly known as: FLOMAX Take 0.4 mg by mouth at bedtime.            Durable Medical Equipment  (From admission, onward)         Start     Ordered   12/19/18 0948  For home use only DME Walker rolling  [facility])  Once    Question:  Patient needs a walker to treat with the following condition  Answer:  Weakness   12/19/18 0948   12/17/18 1058  For home use only DME oxygen  Once    Question Answer Comment  Length of Need Lifetime   Mode or (Route) Nasal cannula   Liters per Minute 4   Frequency Continuous (stationary and portable  oxygen unit needed)   Oxygen conserving device Yes   Oxygen delivery system Gas      12/17/18 1057         Follow-up Information    Curtis Sacramento, MD Follow up in 1 week(s).   Specialty: Family Medicine Contact information: [address] [place] [address] [phone]        Sinda Du, MD. Schedule an appointment as soon as possible for a visit in 2 week(s).   Specialty: Pulmonary Disease Why: Call on 8/17 to schedule. Contact information: [address] [place] [address] [phone]          Allergies  Allergen Reactions  . Codeine Itching  . Trileptal [Oxcarbazepine]     Other reaction(s): Other (See Comments) Abnormal sodium levels Hyponatremia    Consultations:  Pulmonology   Procedures/Studies: Dg Chest Port 1 View  Result Date: 12/15/2018 CLINICAL DATA:  Fever, weakness EXAM: PORTABLE CHEST 1 VIEW COMPARISON:  08/20/2017 chest radiograph. FINDINGS: Low lung volumes. Stable cardiomediastinal silhouette with moderate cardiomegaly. No pneumothorax. Calcified  bilateral pleural plaques and pleural thickening, unchanged. No convincing pleural effusion. Patchy consolidation at both lung bases superimposed on chronic reticular opacities. No overt pulmonary edema. IMPRESSION: 1. Patchy consolidation superimposed on chronic reticular opacities at the lung bases, cannot exclude aspiration or pneumonia. Chest radiograph follow-up advised. 2. 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 3. Moderate cardiomegaly without overt pulmonary edema. Electronically Signed   By: Ilona Sorrel M.D.   On: 12/15/2018 18:08   Dg Knee Complete 4 Views Right  Result Date: 12/15/2018 CLINICAL DATA:  Fall, right knee pain when moving EXAM: RIGHT KNEE - COMPLETE 4+ VIEW COMPARISON:  None. FINDINGS: No fracture or dislocation. Small suprapatellar right knee joint effusion. No suspicious focal osseous lesion. Small superior right patellar enthesophyte. No significant degenerative arthropathy. Posterior vascular calcifications. No radiopaque foreign body. IMPRESSION: Small suprapatellar right knee joint effusion, with no right knee fracture or dislocation. Electronically Signed   By: Ilona Sorrel M.D.   On: 12/15/2018 18:05     Discharge Exam: Vitals:   12/19/18 0547 12/19/18 0731  BP: (!) 158/93   Pulse: 77   Resp: 19   Temp: (!) 97.4 F (36.3 C)   SpO2: 97% 93%   Vitals:   12/18/18 1924 12/18/18 2022 12/19/18 0547 12/19/18 0731  BP:  (!) 182/103 (!) 158/93   Pulse: 88 (!) 105 77   Resp: 18 19 19    Temp:  97.8 F (36.6 C) (!) 97.4 F (36.3 C)   TempSrc:  Oral Oral   SpO2: 92% 94% 97% 93%  Weight:      Height:        General: Pt is alert, awake, not in acute distress Cardiovascular: RRR, S1/S2 +, no rubs, no gallops Respiratory: CTA bilaterally, no wheezing, no rhonchi, currently on 4 L nasal cannula oxygen Abdominal: Soft, NT, ND, bowel sounds + Extremities: no edema, no cyanosis    The results of significant  diagnostics from this hospitalization (including imaging, microbiology, ancillary and laboratory) are listed below for reference.     Microbiology: Recent Results (from the past 240 hour(s))  SARS Coronavirus 2 [facility] order, Performed in [facility] hospital lab) Nasopharyngeal Nasopharyngeal Swab     Status: None   Collection Time: 12/15/18  3:27 PM   Specimen: Nasopharyngeal Swab  Result Value Ref Range Status   SARS Coronavirus 2 NEGATIVE NEGATIVE Final    Comment: (NOTE) If  result is NEGATIVE SARS-CoV-2 target nucleic acids are NOT DETECTED. The SARS-CoV-2 RNA is generally detectable in upper and lower  respiratory specimens during the acute phase of infection. The lowest  concentration of SARS-CoV-2 viral copies this assay can detect is 250  copies / mL. A negative result does not preclude SARS-CoV-2 infection  and should not be used as the sole basis for treatment or other  patient management decisions.  A negative result may occur with  improper specimen collection / handling, submission of specimen other  than nasopharyngeal swab, presence of viral mutation(s) within the  areas targeted by this assay, and inadequate number of viral copies  (<250 copies / mL). A negative result must be combined with clinical  observations, patient history, and epidemiological information. If result is POSITIVE SARS-CoV-2 target nucleic acids are DETECTED. The SARS-CoV-2 RNA is generally detectable in upper and lower  respiratory specimens dur ing the acute phase of infection.  Positive  results are indicative of active infection with SARS-CoV-2.  Clinical  correlation with patient history and other diagnostic information is  necessary to determine patient infection status.  Positive results do  not rule out bacterial infection or co-infection with other viruses. If result is PRESUMPTIVE POSTIVE SARS-CoV-2 nucleic acids MAY BE PRESENT.   A presumptive positive result was obtained on the  submitted specimen  and confirmed on repeat testing.  While 2019 novel coronavirus  (SARS-CoV-2) nucleic acids may be present in the submitted sample  additional confirmatory testing may be necessary for epidemiological  and / or clinical management purposes  to differentiate between  SARS-CoV-2 and other Sarbecovirus currently known to infect humans.  If clinically indicated additional testing with an alternate test  methodology [phone]) is advised. The SARS-CoV-2 RNA is generally  detectable in upper and lower respiratory sp ecimens during the acute  phase of infection. The expected result is Negative. Fact Sheet for Patients:  StrictlyIdeas.no Fact Sheet for Healthcare Providers: BankingDealers.co.za This test is not yet approved or cleared by the [place] FDA and has been authorized for detection and/or diagnosis of SARS-CoV-2 by FDA under an Emergency Use Authorization (EUA).  This EUA will remain in effect (meaning this test can be used) for the duration of the COVID-19 declaration under Section 564(b)(1) of the Act, 21 U.S.C. section 360bbb-3(b)(1), unless the authorization is terminated or revoked sooner. Performed at [facility], [address] [place], [place] [address]   Blood culture (routine x 2)     Status: None (Preliminary result)   Collection Time: 12/15/18  3:51 PM   Specimen: BLOOD RIGHT WRIST  Result Value Ref Range Status   Specimen Description BLOOD RIGHT WRIST  Final   Special Requests   Final    BOTTLES DRAWN AEROBIC AND ANAEROBIC Blood Culture adequate volume   Culture   Final    NO GROWTH 3 DAYS Performed at [facility], [address], [place], [place] [address]    Report Status PENDING  Incomplete  Blood culture (routine x 2)     Status: None (Preliminary result)   Collection Time: 12/15/18  3:52 PM   Specimen: Left Antecubital; Blood  Result Value Ref Range Status   Specimen Description LEFT  ANTECUBITAL  Final   Special Requests   Final    BOTTLES DRAWN AEROBIC AND ANAEROBIC Blood Culture results may not be optimal due to an excessive volume of blood received in culture bottles   Culture   Final    NO GROWTH 3 DAYS  Performed at [facility], [address] [place], [place] [address]    Report Status PENDING  Incomplete  MRSA PCR Screening     Status: None   Collection Time: 12/16/18 10:29 AM   Specimen: Nasal Mucosa; Nasopharyngeal  Result Value Ref Range Status   MRSA by PCR NEGATIVE NEGATIVE Final    Comment:        The GeneXpert MRSA Assay (FDA approved for NASAL specimens only), is one component of a comprehensive MRSA colonization surveillance program. It is not intended to diagnose MRSA infection nor to guide or monitor treatment for MRSA infections. Performed at [facility], [address], [place], [place] [address]      Labs: BNP (last 3 results) No results for input(s): BNP in the last 8760 hours. Basic Metabolic Panel: Recent Labs  Lab 12/15/18 1551 12/16/18 0330 12/17/18 0459 12/18/18 0725 12/19/18 0627  NA 134* 132* 133* 135 135  K 5.3* 5.6* 5.1 4.9 5.0  CL 94* 99 99 101 99  CO2 27 27 26 26 28   GLUCOSE 138* 141* 145* 125* 131*  BUN 24* 29* 27* 28* 30*  CREATININE 2.05* 1.90* 1.12 0.95 1.02  CALCIUM 9.1 7.9* 8.4* 8.6* 8.9   Liver Function Tests: Recent Labs  Lab 12/15/18 1551 12/16/18 0330  AST 20 13*  ALT 11 10  ALKPHOS 80 69  BILITOT 0.7 0.5  PROT 8.1 6.7  ALBUMIN 3.5 2.7*   No results for input(s): LIPASE, AMYLASE in the last 168 hours. No results for input(s): AMMONIA in the last 168 hours. CBC: Recent Labs  Lab 12/15/18 1551 12/16/18 0330 12/17/18 0459 12/18/18 0725 12/19/18 0627  WBC 15.0* 11.6* 8.5 9.1 8.4  NEUTROABS 12.4*  --   --   --   --   HGB 14.9 13.2 12.8* 12.8* 13.0  HCT 50.2 44.7 43.0 42.6 42.8  MCV 92.8 93.9 93.7 93.2 92.4  PLT 260 199 196 234 263   Cardiac Enzymes: No results for input(s): CKTOTAL,  CKMB, CKMBINDEX, TROPONINI in the last 168 hours. BNP: Invalid input(s): POCBNP CBG: No results for input(s): GLUCAP in the last 168 hours. D-Dimer No results for input(s): DDIMER in the last 72 hours. Hgb A1c No results for input(s): HGBA1C in the last 72 hours. Lipid Profile No results for input(s): CHOL, HDL, LDLCALC, TRIG, CHOLHDL, LDLDIRECT in the last 72 hours. Thyroid function studies No results for input(s): TSH, T4TOTAL, T3FREE, THYROIDAB in the last 72 hours.  Invalid input(s): FREET3 Anemia work up No results for input(s): VITAMINB12, FOLATE, FERRITIN, TIBC, IRON, RETICCTPCT in the last 72 hours. Urinalysis    Component Value Date/Time   COLORURINE AMBER (A) 12/16/2018 1200   APPEARANCEUR HAZY (A) 12/16/2018 1200   LABSPEC 1.026 12/16/2018 1200   PHURINE 5.0 12/16/2018 1200   GLUCOSEU NEGATIVE 12/16/2018 1200   HGBUR SMALL (A) 12/16/2018 1200   BILIRUBINUR NEGATIVE 12/16/2018 1200   KETONESUR NEGATIVE 12/16/2018 1200   PROTEINUR 30 (A) 12/16/2018 1200   UROBILINOGEN 0.2 02/12/2015 1859   NITRITE NEGATIVE 12/16/2018 1200   LEUKOCYTESUR NEGATIVE 12/16/2018 1200   Sepsis Labs Invalid input(s): PROCALCITONIN,  WBC,  LACTICIDVEN Microbiology Recent Results (from the past 240 hour(s))  SARS Coronavirus 2 [facility] order, Performed in [facility] hospital lab) Nasopharyngeal Nasopharyngeal Swab     Status: None   Collection Time: 12/15/18  3:27 PM   Specimen: Nasopharyngeal Swab  Result Value Ref Range Status   SARS Coronavirus 2 NEGATIVE NEGATIVE Final    Comment: (NOTE) If  result is NEGATIVE SARS-CoV-2 target nucleic acids are NOT DETECTED. The SARS-CoV-2 RNA is generally detectable in upper and lower  respiratory specimens during the acute phase of infection. The lowest  concentration of SARS-CoV-2 viral copies this assay can detect is 250  copies / mL. A negative result does not preclude SARS-CoV-2 infection  and should not be used as the sole basis for  treatment or other  patient management decisions.  A negative result may occur with  improper specimen collection / handling, submission of specimen other  than nasopharyngeal swab, presence of viral mutation(s) within the  areas targeted by this assay, and inadequate number of viral copies  (<250 copies / mL). A negative result must be combined with clinical  observations, patient history, and epidemiological information. If result is POSITIVE SARS-CoV-2 target nucleic acids are DETECTED. The SARS-CoV-2 RNA is generally detectable in upper and lower  respiratory specimens dur ing the acute phase of infection.  Positive  results are indicative of active infection with SARS-CoV-2.  Clinical  correlation with patient history and other diagnostic information is  necessary to determine patient infection status.  Positive results do  not rule out bacterial infection or co-infection with other viruses. If result is PRESUMPTIVE POSTIVE SARS-CoV-2 nucleic acids MAY BE PRESENT.   A presumptive positive result was obtained on the submitted specimen  and confirmed on repeat testing.  While 2019 novel coronavirus  (SARS-CoV-2) nucleic acids may be present in the submitted sample  additional confirmatory testing may be necessary for epidemiological  and / or clinical management purposes  to differentiate between  SARS-CoV-2 and other Sarbecovirus currently known to infect humans.  If clinically indicated additional testing with an alternate test  methodology [phone]) is advised. The SARS-CoV-2 RNA is generally  detectable in upper and lower respiratory sp ecimens during the acute  phase of infection. The expected result is Negative. Fact Sheet for Patients:  StrictlyIdeas.no Fact Sheet for Healthcare Providers: BankingDealers.co.za This test is not yet approved or cleared by the [place] FDA and has been authorized for detection and/or  diagnosis of SARS-CoV-2 by FDA under an Emergency Use Authorization (EUA).  This EUA will remain in effect (meaning this test can be used) for the duration of the COVID-19 declaration under Section 564(b)(1) of the Act, 21 U.S.C. section 360bbb-3(b)(1), unless the authorization is terminated or revoked sooner. Performed at [facility], [address] [place], [place] [address]   Blood culture (routine x 2)     Status: None (Preliminary result)   Collection Time: 12/15/18  3:51 PM   Specimen: BLOOD RIGHT WRIST  Result Value Ref Range Status   Specimen Description BLOOD RIGHT WRIST  Final   Special Requests   Final    BOTTLES DRAWN AEROBIC AND ANAEROBIC Blood Culture adequate volume   Culture   Final    NO GROWTH 3 DAYS Performed at [facility], [address] [place], [place] [address]    Report Status PENDING  Incomplete  Blood culture (routine x 2)     Status: None (Preliminary result)   Collection Time: 12/15/18  3:52 PM   Specimen: Left Antecubital; Blood  Result Value Ref Range Status   Specimen Description LEFT ANTECUBITAL  Final   Special Requests   Final    BOTTLES DRAWN AEROBIC AND ANAEROBIC Blood Culture results may not be optimal due to an excessive volume of blood received in culture bottles   Culture   Final    NO GROWTH 3 DAYS  Performed at [facility], [address], [place], [place] [address]    Report Status PENDING  Incomplete  MRSA PCR Screening     Status: None   Collection Time: 12/16/18 10:29 AM   Specimen: Nasal Mucosa; Nasopharyngeal  Result Value Ref Range Status   MRSA by PCR NEGATIVE NEGATIVE Final    Comment:        The GeneXpert MRSA Assay (FDA approved for NASAL specimens only), is one component of a comprehensive MRSA colonization surveillance program. It is not intended to diagnose MRSA infection nor to guide or monitor treatment for MRSA infections. Performed at [facility], [address] [place], [place] [address]      Time  coordinating discharge: 35 minutes  SIGNED:   Rodena Goldmann, DO [facility] 12/19/2018, 9:48 AM  If 7PM-7AM, please contact night-coverage [email or web address] Password [email or web address]

## 2018-12-20 LAB — CULTURE, BLOOD (ROUTINE X 2)
Culture: NO GROWTH
Culture: NO GROWTH
Special Requests: ADEQUATE

## 2019-01-01 ENCOUNTER — Emergency Department (HOSPITAL_COMMUNITY): Payer: Medicare Other

## 2019-01-01 ENCOUNTER — Inpatient Hospital Stay (HOSPITAL_COMMUNITY)
Admission: EM | Admit: 2019-01-01 | Discharge: 2019-01-11 | DRG: 190 | Disposition: A | Discharge status: Skilled Nursing Facility | Payer: Medicare Other | Attending: Internal Medicine | Admitting: Internal Medicine

## 2019-01-01 ENCOUNTER — Encounter (HOSPITAL_COMMUNITY): Payer: Self-pay | Admitting: Emergency Medicine

## 2019-01-01 ENCOUNTER — Other Ambulatory Visit: Payer: Self-pay

## 2019-01-01 DIAGNOSIS — E875 Hyperkalemia: Secondary | ICD-10-CM | POA: Diagnosis present

## 2019-01-01 DIAGNOSIS — I5031 Acute diastolic (congestive) heart failure: Secondary | ICD-10-CM

## 2019-01-01 DIAGNOSIS — Z87891 Personal history of nicotine dependence: Secondary | ICD-10-CM

## 2019-01-01 DIAGNOSIS — H409 Unspecified glaucoma: Secondary | ICD-10-CM | POA: Diagnosis present

## 2019-01-01 DIAGNOSIS — Z885 Allergy status to narcotic agent status: Secondary | ICD-10-CM

## 2019-01-01 DIAGNOSIS — J9621 Acute and chronic respiratory failure with hypoxia: Secondary | ICD-10-CM

## 2019-01-01 DIAGNOSIS — E785 Hyperlipidemia, unspecified: Secondary | ICD-10-CM | POA: Diagnosis present

## 2019-01-01 DIAGNOSIS — Z888 Allergy status to other drugs, medicaments and biological substances status: Secondary | ICD-10-CM

## 2019-01-01 DIAGNOSIS — Z66 Do not resuscitate: Secondary | ICD-10-CM | POA: Diagnosis present

## 2019-01-01 DIAGNOSIS — Z9861 Coronary angioplasty status: Secondary | ICD-10-CM

## 2019-01-01 DIAGNOSIS — Z7951 Long term (current) use of inhaled steroids: Secondary | ICD-10-CM

## 2019-01-01 DIAGNOSIS — E872 Acidosis: Secondary | ICD-10-CM | POA: Diagnosis present

## 2019-01-01 DIAGNOSIS — I509 Heart failure, unspecified: Secondary | ICD-10-CM

## 2019-01-01 DIAGNOSIS — N179 Acute kidney failure, unspecified: Secondary | ICD-10-CM | POA: Diagnosis present

## 2019-01-01 DIAGNOSIS — I252 Old myocardial infarction: Secondary | ICD-10-CM

## 2019-01-01 DIAGNOSIS — J441 Chronic obstructive pulmonary disease with (acute) exacerbation: Secondary | ICD-10-CM | POA: Diagnosis not present

## 2019-01-01 DIAGNOSIS — Z6825 Body mass index (BMI) 25.0-25.9, adult: Secondary | ICD-10-CM

## 2019-01-01 DIAGNOSIS — R339 Retention of urine, unspecified: Secondary | ICD-10-CM | POA: Diagnosis present

## 2019-01-01 DIAGNOSIS — J9622 Acute and chronic respiratory failure with hypercapnia: Secondary | ICD-10-CM | POA: Diagnosis present

## 2019-01-01 DIAGNOSIS — F419 Anxiety disorder, unspecified: Secondary | ICD-10-CM | POA: Diagnosis present

## 2019-01-01 DIAGNOSIS — Z7982 Long term (current) use of aspirin: Secondary | ICD-10-CM

## 2019-01-01 DIAGNOSIS — R0602 Shortness of breath: Secondary | ICD-10-CM | POA: Diagnosis not present

## 2019-01-01 DIAGNOSIS — I5033 Acute on chronic diastolic (congestive) heart failure: Secondary | ICD-10-CM | POA: Diagnosis present

## 2019-01-01 DIAGNOSIS — I2510 Atherosclerotic heart disease of native coronary artery without angina pectoris: Secondary | ICD-10-CM | POA: Diagnosis present

## 2019-01-01 DIAGNOSIS — K219 Gastro-esophageal reflux disease without esophagitis: Secondary | ICD-10-CM | POA: Diagnosis present

## 2019-01-01 DIAGNOSIS — F329 Major depressive disorder, single episode, unspecified: Secondary | ICD-10-CM | POA: Diagnosis present

## 2019-01-01 DIAGNOSIS — I4892 Unspecified atrial flutter: Secondary | ICD-10-CM | POA: Diagnosis present

## 2019-01-01 DIAGNOSIS — Z85828 Personal history of other malignant neoplasm of skin: Secondary | ICD-10-CM

## 2019-01-01 DIAGNOSIS — R230 Cyanosis: Secondary | ICD-10-CM | POA: Diagnosis present

## 2019-01-01 DIAGNOSIS — I739 Peripheral vascular disease, unspecified: Secondary | ICD-10-CM | POA: Diagnosis present

## 2019-01-01 DIAGNOSIS — L89616 Pressure-induced deep tissue damage of right heel: Secondary | ICD-10-CM | POA: Diagnosis present

## 2019-01-01 DIAGNOSIS — L89612 Pressure ulcer of right heel, stage 2: Secondary | ICD-10-CM | POA: Insufficient documentation

## 2019-01-01 DIAGNOSIS — G9050 Complex regional pain syndrome I, unspecified: Secondary | ICD-10-CM | POA: Diagnosis present

## 2019-01-01 DIAGNOSIS — I471 Supraventricular tachycardia: Secondary | ICD-10-CM | POA: Diagnosis present

## 2019-01-01 DIAGNOSIS — E440 Moderate protein-calorie malnutrition: Secondary | ICD-10-CM | POA: Diagnosis present

## 2019-01-01 DIAGNOSIS — I110 Hypertensive heart disease with heart failure: Secondary | ICD-10-CM | POA: Diagnosis present

## 2019-01-01 DIAGNOSIS — I491 Atrial premature depolarization: Secondary | ICD-10-CM | POA: Diagnosis present

## 2019-01-01 DIAGNOSIS — K589 Irritable bowel syndrome without diarrhea: Secondary | ICD-10-CM | POA: Diagnosis present

## 2019-01-01 DIAGNOSIS — Z20828 Contact with and (suspected) exposure to other viral communicable diseases: Secondary | ICD-10-CM | POA: Diagnosis present

## 2019-01-01 DIAGNOSIS — Z8701 Personal history of pneumonia (recurrent): Secondary | ICD-10-CM

## 2019-01-01 DIAGNOSIS — M1990 Unspecified osteoarthritis, unspecified site: Secondary | ICD-10-CM | POA: Diagnosis present

## 2019-01-01 DIAGNOSIS — Z79899 Other long term (current) drug therapy: Secondary | ICD-10-CM

## 2019-01-01 DIAGNOSIS — R600 Localized edema: Secondary | ICD-10-CM

## 2019-01-01 DIAGNOSIS — L89626 Pressure-induced deep tissue damage of left heel: Secondary | ICD-10-CM | POA: Diagnosis present

## 2019-01-01 DIAGNOSIS — G9341 Metabolic encephalopathy: Secondary | ICD-10-CM | POA: Diagnosis present

## 2019-01-01 DIAGNOSIS — I714 Abdominal aortic aneurysm, without rupture: Secondary | ICD-10-CM | POA: Diagnosis present

## 2019-01-01 DIAGNOSIS — J9611 Chronic respiratory failure with hypoxia: Secondary | ICD-10-CM

## 2019-01-01 DIAGNOSIS — R296 Repeated falls: Secondary | ICD-10-CM | POA: Diagnosis present

## 2019-01-01 HISTORY — DX: Atherosclerotic heart disease of native coronary artery without angina pectoris: I25.10

## 2019-01-01 LAB — CBC WITH DIFFERENTIAL/PLATELET
Abs Immature Granulocytes: 0.14 10*3/uL — ABNORMAL HIGH (ref 0.00–0.07)
Basophils Absolute: 0 10*3/uL (ref 0.0–0.1)
Basophils Relative: 1 %
Eosinophils Absolute: 0.1 10*3/uL (ref 0.0–0.5)
Eosinophils Relative: 1 %
HCT: 51.1 % (ref 39.0–52.0)
Hemoglobin: 14.2 g/dL (ref 13.0–17.0)
Immature Granulocytes: 2 %
Lymphocytes Relative: 5 %
Lymphs Abs: 0.4 10*3/uL — ABNORMAL LOW (ref 0.7–4.0)
MCH: 28.7 pg (ref 26.0–34.0)
MCHC: 27.8 g/dL — ABNORMAL LOW (ref 30.0–36.0)
MCV: 103.4 fL — ABNORMAL HIGH (ref 80.0–100.0)
Monocytes Absolute: 0.9 10*3/uL (ref 0.1–1.0)
Monocytes Relative: 11 %
Neutro Abs: 6.6 10*3/uL (ref 1.7–7.7)
Neutrophils Relative %: 80 %
Platelets: 216 10*3/uL (ref 150–400)
RBC: 4.94 MIL/uL (ref 4.22–5.81)
RDW: 20.8 % — ABNORMAL HIGH (ref 11.5–15.5)
WBC: 8.2 10*3/uL (ref 4.0–10.5)
nRBC: 3.3 % — ABNORMAL HIGH (ref 0.0–0.2)

## 2019-01-01 LAB — LACTIC ACID, PLASMA: Lactic Acid, Venous: 1 mmol/L (ref 0.5–1.9)

## 2019-01-01 MED ORDER — SODIUM CHLORIDE 0.9 % IV BOLUS: 500.0000 mL | Freq: Once | INTRAVENOUS | Status: DC

## 2019-01-01 MED ORDER — METHYLPREDNISOLONE SODIUM SUCC 125 MG IJ SOLR
125.0000 mg | Freq: Once | INTRAMUSCULAR | Status: AC
  Administered 2019-01-02: 125 mg via INTRAVENOUS
  Filled 2019-01-01: qty 2

## 2019-01-01 MED ORDER — FUROSEMIDE 10 MG/ML IJ SOLN
60.0000 mg | Freq: Once | INTRAMUSCULAR | Status: AC
  Administered 2019-01-02: 60 mg via INTRAVENOUS
  Filled 2019-01-01: qty 6

## 2019-01-01 NOTE — ED Notes (Signed)
Pt's son is Elta Guadeloupe and he can be reached at [phone].

## 2019-01-01 NOTE — ED Triage Notes (Signed)
Pt brought in by [facility]. Pt unsure of why he came to the hospital. Pt is SOB. Pts fingers and lips blue. Pt denies pain.

## 2019-01-01 NOTE — ED Provider Notes (Signed)
Medical screening exam: Level 5 caveat for uncertain history.  Patient apparently lives alone.  He is uncertain why he came in the hospital.  EMS reports a low pulse ox and fingers and lips blue.  Pulse ox improved dramatically with 4 L of nasal oxygen.  He is not obviously dyspneic or air hungry on physical exam.  EKG shows questionable atrial flutter.  We will proceed with typical screening tests including EKG, chest x-ray, labs.  Discussed with Dr. Eliane Decree.   Nat Christen, MD 01/01/19 2253

## 2019-01-02 ENCOUNTER — Inpatient Hospital Stay (HOSPITAL_COMMUNITY): Payer: Medicare Other

## 2019-01-02 DIAGNOSIS — J441 Chronic obstructive pulmonary disease with (acute) exacerbation: Secondary | ICD-10-CM | POA: Diagnosis present

## 2019-01-02 DIAGNOSIS — E440 Moderate protein-calorie malnutrition: Secondary | ICD-10-CM | POA: Diagnosis present

## 2019-01-02 DIAGNOSIS — E875 Hyperkalemia: Secondary | ICD-10-CM | POA: Diagnosis present

## 2019-01-02 DIAGNOSIS — J9622 Acute and chronic respiratory failure with hypercapnia: Secondary | ICD-10-CM | POA: Diagnosis present

## 2019-01-02 DIAGNOSIS — F329 Major depressive disorder, single episode, unspecified: Secondary | ICD-10-CM | POA: Diagnosis present

## 2019-01-02 DIAGNOSIS — I509 Heart failure, unspecified: Secondary | ICD-10-CM | POA: Diagnosis not present

## 2019-01-02 DIAGNOSIS — J9611 Chronic respiratory failure with hypoxia: Secondary | ICD-10-CM

## 2019-01-02 DIAGNOSIS — R230 Cyanosis: Secondary | ICD-10-CM | POA: Diagnosis present

## 2019-01-02 DIAGNOSIS — L89629 Pressure ulcer of left heel, unspecified stage: Secondary | ICD-10-CM | POA: Diagnosis not present

## 2019-01-02 DIAGNOSIS — R339 Retention of urine, unspecified: Secondary | ICD-10-CM | POA: Diagnosis present

## 2019-01-02 DIAGNOSIS — L89626 Pressure-induced deep tissue damage of left heel: Secondary | ICD-10-CM | POA: Diagnosis present

## 2019-01-02 DIAGNOSIS — Z66 Do not resuscitate: Secondary | ICD-10-CM | POA: Diagnosis present

## 2019-01-02 DIAGNOSIS — F419 Anxiety disorder, unspecified: Secondary | ICD-10-CM | POA: Diagnosis present

## 2019-01-02 DIAGNOSIS — I10 Essential (primary) hypertension: Secondary | ICD-10-CM

## 2019-01-02 DIAGNOSIS — J9621 Acute and chronic respiratory failure with hypoxia: Secondary | ICD-10-CM | POA: Diagnosis present

## 2019-01-02 DIAGNOSIS — I5033 Acute on chronic diastolic (congestive) heart failure: Secondary | ICD-10-CM | POA: Diagnosis present

## 2019-01-02 DIAGNOSIS — I471 Supraventricular tachycardia: Secondary | ICD-10-CM | POA: Diagnosis present

## 2019-01-02 DIAGNOSIS — G9341 Metabolic encephalopathy: Secondary | ICD-10-CM | POA: Diagnosis present

## 2019-01-02 DIAGNOSIS — R296 Repeated falls: Secondary | ICD-10-CM | POA: Diagnosis present

## 2019-01-02 DIAGNOSIS — I483 Typical atrial flutter: Secondary | ICD-10-CM | POA: Diagnosis not present

## 2019-01-02 DIAGNOSIS — R0602 Shortness of breath: Secondary | ICD-10-CM | POA: Diagnosis present

## 2019-01-02 DIAGNOSIS — I110 Hypertensive heart disease with heart failure: Secondary | ICD-10-CM | POA: Diagnosis present

## 2019-01-02 DIAGNOSIS — E872 Acidosis: Secondary | ICD-10-CM | POA: Diagnosis present

## 2019-01-02 DIAGNOSIS — G9050 Complex regional pain syndrome I, unspecified: Secondary | ICD-10-CM | POA: Diagnosis present

## 2019-01-02 DIAGNOSIS — I4892 Unspecified atrial flutter: Secondary | ICD-10-CM | POA: Diagnosis present

## 2019-01-02 DIAGNOSIS — I4891 Unspecified atrial fibrillation: Secondary | ICD-10-CM | POA: Diagnosis not present

## 2019-01-02 DIAGNOSIS — L89616 Pressure-induced deep tissue damage of right heel: Secondary | ICD-10-CM | POA: Diagnosis present

## 2019-01-02 DIAGNOSIS — Z20828 Contact with and (suspected) exposure to other viral communicable diseases: Secondary | ICD-10-CM | POA: Diagnosis present

## 2019-01-02 DIAGNOSIS — N179 Acute kidney failure, unspecified: Secondary | ICD-10-CM | POA: Diagnosis present

## 2019-01-02 DIAGNOSIS — Z6825 Body mass index (BMI) 25.0-25.9, adult: Secondary | ICD-10-CM | POA: Diagnosis not present

## 2019-01-02 DIAGNOSIS — I5031 Acute diastolic (congestive) heart failure: Secondary | ICD-10-CM | POA: Diagnosis not present

## 2019-01-02 DIAGNOSIS — I2510 Atherosclerotic heart disease of native coronary artery without angina pectoris: Secondary | ICD-10-CM | POA: Diagnosis present

## 2019-01-02 LAB — COMPREHENSIVE METABOLIC PANEL
ALT: 32 U/L (ref 0–44)
ALT: 36 U/L (ref 0–44)
AST: 28 U/L (ref 15–41)
AST: 26 U/L (ref 15–41)
Albumin: 3.1 g/dL — ABNORMAL LOW (ref 3.5–5.0)
Albumin: 3.3 g/dL — ABNORMAL LOW (ref 3.5–5.0)
Alkaline Phosphatase: 72 U/L (ref 38–126)
Alkaline Phosphatase: 80 U/L (ref 38–126)
Anion gap: 7 (ref 5–15)
Anion gap: 13 (ref 5–15)
BUN: 46 mg/dL — ABNORMAL HIGH (ref 8–23)
BUN: 45 mg/dL — ABNORMAL HIGH (ref 8–23)
CO2: 35 mmol/L — ABNORMAL HIGH (ref 22–32)
CO2: 35 mmol/L — ABNORMAL HIGH (ref 22–32)
Calcium: 8.7 mg/dL — ABNORMAL LOW (ref 8.9–10.3)
Calcium: 9 mg/dL (ref 8.9–10.3)
Chloride: 100 mmol/L (ref 98–111)
Chloride: 95 mmol/L — ABNORMAL LOW (ref 98–111)
Creatinine, Ser: 1.88 mg/dL — ABNORMAL HIGH (ref 0.61–1.24)
Creatinine, Ser: 1.52 mg/dL — ABNORMAL HIGH (ref 0.61–1.24)
GFR calc Af Amer: 40 mL/min — ABNORMAL LOW (ref >60)
GFR calc Af Amer: 52 mL/min — ABNORMAL LOW (ref >60)
GFR calc non Af Amer: 34 mL/min — ABNORMAL LOW (ref >60)
GFR calc non Af Amer: 44 mL/min — ABNORMAL LOW (ref >60)
Glucose, Bld: 100 mg/dL — ABNORMAL HIGH (ref 70–99)
Glucose, Bld: 132 mg/dL — ABNORMAL HIGH (ref 70–99)
Potassium: 5.8 mmol/L — ABNORMAL HIGH (ref 3.5–5.1)
Potassium: 4.7 mmol/L (ref 3.5–5.1)
Sodium: 142 mmol/L (ref 135–145)
Sodium: 143 mmol/L (ref 135–145)
Total Bilirubin: 0.6 mg/dL (ref 0.3–1.2)
Total Bilirubin: 0.9 mg/dL (ref 0.3–1.2)
Total Protein: 6.6 g/dL (ref 6.5–8.1)
Total Protein: 6.8 g/dL (ref 6.5–8.1)

## 2019-01-02 LAB — BLOOD GAS, ARTERIAL
Acid-Base Excess: 10.6 mmol/L — ABNORMAL HIGH (ref 0.0–2.0)
Acid-Base Excess: 11.8 mmol/L — ABNORMAL HIGH (ref 0.0–2.0)
Acid-Base Excess: 12.7 mmol/L — ABNORMAL HIGH (ref 0.0–2.0)
Bicarbonate: 29.5 mmol/L — ABNORMAL HIGH (ref 20.0–28.0)
Bicarbonate: 30.8 mmol/L — ABNORMAL HIGH (ref 20.0–28.0)
Bicarbonate: 34.9 mmol/L — ABNORMAL HIGH (ref 20.0–28.0)
FIO2: 36
FIO2: 35
FIO2: 35
O2 Saturation: 92 %
O2 Saturation: 88.3 %
O2 Saturation: 96.6 %
Patient temperature: 37
Patient temperature: 37
Patient temperature: 36.6
pCO2 arterial: 111 mmHg (ref 32.0–48.0)
pCO2 arterial: 103 mmHg (ref 32.0–48.0)
pCO2 arterial: 57.9 mmHg — ABNORMAL HIGH (ref 32.0–48.0)
pH, Arterial: 7.173 — CL (ref 7.350–7.450)
pH, Arterial: 7.212 — ABNORMAL LOW (ref 7.350–7.450)
pH, Arterial: 7.429 (ref 7.350–7.450)
pO2, Arterial: 74.3 mmHg — ABNORMAL LOW (ref 83.0–108.0)
pO2, Arterial: 61.8 mmHg — ABNORMAL LOW (ref 83.0–108.0)
pO2, Arterial: 80.5 mmHg — ABNORMAL LOW (ref 83.0–108.0)

## 2019-01-02 LAB — URINALYSIS, ROUTINE W REFLEX MICROSCOPIC
Bilirubin Urine: NEGATIVE
Glucose, UA: NEGATIVE mg/dL
Hgb urine dipstick: NEGATIVE
Ketones, ur: NEGATIVE mg/dL
Leukocytes,Ua: NEGATIVE
Nitrite: NEGATIVE
Protein, ur: 100 mg/dL — AB
Specific Gravity, Urine: 1.026 (ref 1.005–1.030)
pH: 5 (ref 5.0–8.0)

## 2019-01-02 LAB — LACTIC ACID, PLASMA: Lactic Acid, Venous: 1 mmol/L (ref 0.5–1.9)

## 2019-01-02 LAB — CREATININE, SERUM
Creatinine, Ser: 1.72 mg/dL — ABNORMAL HIGH (ref 0.61–1.24)
GFR calc Af Amer: 44 mL/min — ABNORMAL LOW (ref >60)
GFR calc non Af Amer: 38 mL/min — ABNORMAL LOW (ref >60)

## 2019-01-02 LAB — CBC
HCT: 51.8 % (ref 39.0–52.0)
Hemoglobin: 14.5 g/dL (ref 13.0–17.0)
MCH: 28.3 pg (ref 26.0–34.0)
MCHC: 28 g/dL — ABNORMAL LOW (ref 30.0–36.0)
MCV: 101.2 fL — ABNORMAL HIGH (ref 80.0–100.0)
Platelets: 219 10*3/uL (ref 150–400)
RBC: 5.12 MIL/uL (ref 4.22–5.81)
RDW: 20.3 % — ABNORMAL HIGH (ref 11.5–15.5)
WBC: 5.3 10*3/uL (ref 4.0–10.5)
nRBC: 1.1 % — ABNORMAL HIGH (ref 0.0–0.2)

## 2019-01-02 LAB — CHLORIDE: Chloride: 97 mmol/L — ABNORMAL LOW (ref 98–111)

## 2019-01-02 LAB — SARS CORONAVIRUS 2 BY RT PCR (HOSPITAL ORDER, PERFORMED IN ~~LOC~~ HOSPITAL LAB): SARS Coronavirus 2: NEGATIVE

## 2019-01-02 LAB — TROPONIN I (HIGH SENSITIVITY)
Troponin I (High Sensitivity): 20 ng/L — ABNORMAL HIGH (ref <18)
Troponin I (High Sensitivity): 21 ng/L — ABNORMAL HIGH (ref <18)

## 2019-01-02 LAB — BUN: BUN: 45 mg/dL — ABNORMAL HIGH (ref 8–23)

## 2019-01-02 LAB — ECHOCARDIOGRAM COMPLETE
Height: 72 in
Weight: 3375.68 oz

## 2019-01-02 LAB — MAGNESIUM: Magnesium: 2 mg/dL (ref 1.7–2.4)

## 2019-01-02 LAB — VITAMIN B12: Vitamin B-12: 296 pg/mL (ref 180–914)

## 2019-01-02 LAB — GLUCOSE, RANDOM
Glucose, Bld: 161 mg/dL — ABNORMAL HIGH (ref 70–99)
Glucose, Bld: 163 mg/dL — ABNORMAL HIGH (ref 70–99)

## 2019-01-02 LAB — BRAIN NATRIURETIC PEPTIDE: B Natriuretic Peptide: 885 pg/mL — ABNORMAL HIGH (ref 0.0–100.0)

## 2019-01-02 LAB — CALCIUM: Calcium: 9 mg/dL (ref 8.9–10.3)

## 2019-01-02 LAB — CO2, TOTAL: CO2: 31 mmol/L (ref 22–32)

## 2019-01-02 LAB — POTASSIUM
Potassium: 4.9 mmol/L (ref 3.5–5.1)
Potassium: 4.6 mmol/L (ref 3.5–5.1)

## 2019-01-02 LAB — MRSA PCR SCREENING: MRSA by PCR: NEGATIVE

## 2019-01-02 LAB — EXPECTORATED SPUTUM ASSESSMENT W GRAM STAIN, RFLX TO RESP C

## 2019-01-02 LAB — NA AND K (SODIUM & POTASSIUM), RAND UR
Potassium Urine: 45 mmol/L
Sodium, Ur: 43 mmol/L

## 2019-01-02 LAB — SODIUM: Sodium: 142 mmol/L (ref 135–145)

## 2019-01-02 LAB — TSH: TSH: 0.435 u[IU]/mL (ref 0.350–4.500)

## 2019-01-02 LAB — PROCALCITONIN: Procalcitonin: <0.1 ng/mL

## 2019-01-02 LAB — CREATININE, URINE, RANDOM: Creatinine, Urine: 183.87 mg/dL

## 2019-01-02 MED ORDER — CALCIUM GLUCONATE-NACL 1-0.675 GM/50ML-% IV SOLN
1.0000 g | Freq: Once | INTRAVENOUS | Status: AC
  Administered 2019-01-02: 05:00:00 1000 mg via INTRAVENOUS
  Filled 2019-01-02: qty 50

## 2019-01-02 MED ORDER — AMLODIPINE BESYLATE 5 MG PO TABS
2.5000 mg | ORAL_TABLET | Freq: Every day | ORAL | Status: DC
  Administered 2019-01-02 – 2019-01-03 (×2): 2.5 mg via ORAL
  Filled 2019-01-02 (×3): qty 1

## 2019-01-02 MED ORDER — TAMSULOSIN HCL 0.4 MG PO CAPS
0.4000 mg | ORAL_CAPSULE | Freq: Every day | ORAL | Status: DC
  Administered 2019-01-02 – 2019-01-10 (×9): 0.4 mg via ORAL
  Filled 2019-01-02 (×9): qty 1

## 2019-01-02 MED ORDER — INSULIN ASPART 100 UNIT/ML IV SOLN
5.0000 [IU] | Freq: Once | INTRAVENOUS | Status: AC
  Administered 2019-01-02: 05:00:00 5 [IU] via INTRAVENOUS

## 2019-01-02 MED ORDER — METHYLPREDNISOLONE SODIUM SUCC 125 MG IJ SOLR
60.0000 mg | Freq: Three times a day (TID) | INTRAMUSCULAR | Status: DC
  Administered 2019-01-02 – 2019-01-03 (×4): 60 mg via INTRAVENOUS
  Filled 2019-01-02 (×4): qty 2

## 2019-01-02 MED ORDER — LEVOFLOXACIN IN D5W 750 MG/150ML IV SOLN
750.0000 mg | INTRAVENOUS | Status: DC
  Administered 2019-01-02: 11:00:00 750 mg via INTRAVENOUS
  Filled 2019-01-02: qty 150

## 2019-01-02 MED ORDER — FUROSEMIDE 10 MG/ML IJ SOLN
40.0000 mg | Freq: Every day | INTRAMUSCULAR | Status: DC
  Administered 2019-01-02 – 2019-01-03 (×2): 40 mg via INTRAVENOUS
  Filled 2019-01-02 (×2): qty 4

## 2019-01-02 MED ORDER — ORAL CARE MOUTH RINSE
15.0000 mL | Freq: Two times a day (BID) | OROMUCOSAL | Status: DC
  Administered 2019-01-02 – 2019-01-11 (×16): 15 mL via OROMUCOSAL

## 2019-01-02 MED ORDER — METHYLPREDNISOLONE SODIUM SUCC 125 MG IJ SOLR: 60.0000 mg | Freq: Two times a day (BID) | INTRAMUSCULAR | Status: DC

## 2019-01-02 MED ORDER — ASPIRIN EC 81 MG PO TBEC
81.0000 mg | DELAYED_RELEASE_TABLET | Freq: Every day | ORAL | Status: DC
  Administered 2019-01-02 – 2019-01-11 (×10): 81 mg via ORAL
  Filled 2019-01-02 (×10): qty 1

## 2019-01-02 MED ORDER — IPRATROPIUM-ALBUTEROL 0.5-2.5 (3) MG/3ML IN SOLN
3.0000 mL | Freq: Four times a day (QID) | RESPIRATORY_TRACT | Status: DC
  Administered 2019-01-02 – 2019-01-06 (×17): 3 mL via RESPIRATORY_TRACT
  Filled 2019-01-02 (×17): qty 3

## 2019-01-02 MED ORDER — ACETAMINOPHEN 325 MG PO TABS: 650.0000 mg | ORAL_TABLET | Freq: Four times a day (QID) | ORAL | Status: DC | PRN

## 2019-01-02 MED ORDER — ONDANSETRON HCL 4 MG/2ML IJ SOLN: 4.0000 mg | Freq: Four times a day (QID) | INTRAMUSCULAR | Status: DC | PRN

## 2019-01-02 MED ORDER — IPRATROPIUM-ALBUTEROL 0.5-2.5 (3) MG/3ML IN SOLN: 3.0000 mL | RESPIRATORY_TRACT | Status: DC | PRN

## 2019-01-02 MED ORDER — BISACODYL 10 MG RE SUPP: 10.0000 mg | Freq: Every day | RECTAL | Status: DC | PRN

## 2019-01-02 MED ORDER — AMITRIPTYLINE HCL 25 MG PO TABS
100.0000 mg | ORAL_TABLET | Freq: Every day | ORAL | Status: DC
  Administered 2019-01-02 – 2019-01-10 (×9): 100 mg via ORAL
  Filled 2019-01-02 (×9): qty 4

## 2019-01-02 MED ORDER — DEXTROSE 50 % IV SOLN
INTRAVENOUS | Status: AC
  Filled 2019-01-02: qty 50

## 2019-01-02 MED ORDER — CHLORHEXIDINE GLUCONATE CLOTH 2 % EX PADS
6.0000 | MEDICATED_PAD | Freq: Every day | CUTANEOUS | Status: DC
  Administered 2019-01-02 – 2019-01-11 (×9): 6 via TOPICAL

## 2019-01-02 MED ORDER — ONDANSETRON HCL 4 MG PO TABS: 4.0000 mg | ORAL_TABLET | Freq: Four times a day (QID) | ORAL | Status: DC | PRN

## 2019-01-02 MED ORDER — FINASTERIDE 5 MG PO TABS
5.0000 mg | ORAL_TABLET | Freq: Every morning | ORAL | Status: DC
  Administered 2019-01-02 – 2019-01-11 (×10): 5 mg via ORAL
  Filled 2019-01-02 (×10): qty 1

## 2019-01-02 MED ORDER — TRAMADOL HCL 50 MG PO TABS
50.0000 mg | ORAL_TABLET | Freq: Four times a day (QID) | ORAL | Status: DC | PRN
  Administered 2019-01-07 – 2019-01-11 (×4): 50 mg via ORAL
  Filled 2019-01-02 (×4): qty 1

## 2019-01-02 MED ORDER — ATORVASTATIN CALCIUM 40 MG PO TABS
40.0000 mg | ORAL_TABLET | Freq: Every day | ORAL | Status: DC
  Administered 2019-01-02 – 2019-01-10 (×9): 40 mg via ORAL
  Filled 2019-01-02 (×9): qty 1

## 2019-01-02 MED ORDER — BUDESONIDE 0.5 MG/2ML IN SUSP
0.5000 mg | Freq: Two times a day (BID) | RESPIRATORY_TRACT | Status: DC
  Administered 2019-01-02 – 2019-01-11 (×19): 0.5 mg via RESPIRATORY_TRACT
  Filled 2019-01-02 (×18): qty 2

## 2019-01-02 MED ORDER — IPRATROPIUM-ALBUTEROL 0.5-2.5 (3) MG/3ML IN SOLN: 3.0000 mL | Freq: Four times a day (QID) | RESPIRATORY_TRACT | Status: DC

## 2019-01-02 MED ORDER — METOPROLOL TARTRATE 25 MG PO TABS
12.5000 mg | ORAL_TABLET | Freq: Two times a day (BID) | ORAL | Status: DC
  Administered 2019-01-02 – 2019-01-11 (×19): 12.5 mg via ORAL
  Filled 2019-01-02 (×19): qty 1

## 2019-01-02 MED ORDER — ACETAMINOPHEN 650 MG RE SUPP: 650.0000 mg | Freq: Four times a day (QID) | RECTAL | Status: DC | PRN

## 2019-01-02 MED ORDER — MOMETASONE FURO-FORMOTEROL FUM 100-5 MCG/ACT IN AERO: 2.0000 | INHALATION_SPRAY | Freq: Two times a day (BID) | RESPIRATORY_TRACT | Status: DC

## 2019-01-02 MED ORDER — NITROGLYCERIN 0.4 MG SL SUBL: 0.4000 mg | SUBLINGUAL_TABLET | SUBLINGUAL | Status: DC | PRN

## 2019-01-02 MED ORDER — BISACODYL 5 MG PO TBEC: 5.0000 mg | DELAYED_RELEASE_TABLET | Freq: Every day | ORAL | Status: DC | PRN

## 2019-01-02 MED ORDER — DEXTROSE 50 % IV SOLN
1.0000 | Freq: Once | INTRAVENOUS | Status: AC
  Administered 2019-01-02: 05:00:00 50 mL via INTRAVENOUS

## 2019-01-02 MED ORDER — ALPRAZOLAM 0.5 MG PO TABS
2.0000 mg | ORAL_TABLET | Freq: Once | ORAL | Status: AC
  Administered 2019-01-02: 2 mg via ORAL
  Filled 2019-01-02: qty 4

## 2019-01-02 MED ORDER — HEPARIN SODIUM (PORCINE) 5000 UNIT/ML IJ SOLN
5000.0000 [IU] | Freq: Three times a day (TID) | INTRAMUSCULAR | Status: DC
  Administered 2019-01-02 – 2019-01-11 (×27): 5000 [IU] via SUBCUTANEOUS
  Filled 2019-01-02 (×28): qty 1

## 2019-01-02 NOTE — H&P (Addendum)
TRH H&P    Patient Demographics:    Curtis [name], is a 73 y.o. male  MRN: [id number]  DOB - 1945/11/29  Admit Date - 01/01/2019  Referring MD/NP/PA: Dr. Tomi Bamberger  Outpatient Primary MD for the patient is [name], MD  Patient coming from: Home  Chief complaint-altered mental status   HPI:    Curtis [name]  is a 73 y.o. male, with history of pulmonary asbestosis, COPD, peripheral vascular disease, coronary artery disease status post MI in 2015, IBS, hypertension, hyperlipidemia, glaucoma, GERD, diverticulosis, history of AAA presents to the ED with son for altered mental status.  Unfortunately son is a longer at bed side at time of my exam.  History is collected from medical record and from ER staff.  Apparently patient had just been discharged from the hospital within the last 2 weeks.  At that time he had community-acquired pneumonia and acute hypoxic and hypercarbic respiratory failure.  He was discharged with instructions to follow-up with his primary care physician in 1 week.  He was continued on 4 L nasal cannula at home with up to 8 L for ambulation.  He did a steroid taper, continued Augmentin, was prescribed Symbicort, and received a rolling walker.  Son reported to the ED staff the patient was at first doing better but not quite to his baseline.  He started complaining of shortness of breath shortly after that discharge.  Then 2 days ago he started having confusion at home.  Son brought father in unfortunately found that he was in hypoxic hypercarbic respiratory failure once again.  He also had a chest x-ray that showed congestion.  His BNP was in the 800s.  Last one on file was in the 70s.  Patient's last echo was in 2015.  So patient is being admitted for acute hypoxic and hypercarbic respiratory failure secondary to COPD exacerbation, and CHF as well.  ED course Patient had blood gas done in the ED  that showed a pH of 7.173, PCO2 of 111, PO2 of 74, bicarb of 29.5 on 36 FiO2.  Patient chemistry profile that showed hyperkalemia with a potassium of 5.8, elevated bicarbonate 35, BUN of 46 and creatinine of 1.88, hypoalbuminemia at 3.1 and a drop in GFR from greater than 60-34.  Patient BNP that was 885.  Patient had troponin done that were elevated at 20 and 21.  Lactic acid was 1.02 there is no leukocytosis, hemoglobin is up to 14 from 12, MCV is 103.4.  Echo showed sinus rhythm with premature atrial complexes rate of 80 bpm no significant ST elevation QTC of 441.  Chest x-ray showed #1.  Cardiomegaly with vascular congestion. 2.  Small bilateral pleural effusions and bibasilar densities similar to prior radiograph.  Congestion is new since radiograph on December 15, 2018.  Of the test is negative.  Urine is hazy amber negative for leukocytes and nitrites positive for 100 protein rare bacteria Hylan casts and mucus present.   Review of systems:    Review of systems could not be completed due to patient's  altered mental status.    Past History of the following :    Past Medical History:  Diagnosis Date  . AAA (abdominal aortic aneurysm), stable 04/20/2014  . Arthritis   . 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 Cancer ([place])   . Depression   . Diverticulitis    hospital 2011 [facility]  . Diverticulosis   . GERD (gastroesophageal reflux disease)   . 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 Hernia of unspecified site of abdominal cavity without mention of obstruction or gangrene    hiatal  . Hyperlipidemia LDL goal <70 04/20/2014  . Hypertension   . IBS (irritable bowel syndrome)   . Myocardial infarction ([place]) 01/2014   NSTEMI  . Peripheral vascular disease (HCC)    ankle brachial index of 0.79 on the right and 0.61 on the left.   . 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[place])   . Reflex sympathetic dystrophy   . 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 SOB (shortness of breath)       Past Surgical History:  Procedure  Laterality Date  . CARDIAC CATHETERIZATION    . CATARACT EXTRACTION    . CORONARY ANGIOPLASTY    . HERNIA REPAIR    . LEFT HEART CATHETERIZATION WITH CORONARY ANGIOGRAM N/A 01/17/2014   Procedure: LEFT HEART CATHETERIZATION WITH CORONARY ANGIOGRAM;  Surgeon: Leonie Man, MD;  Location[facility] C[facility] LAB;  Service: Cardiovascular;  Laterality: N/A;  . left shoulder      x 3  . NISSEN FUNDOPLICATION    . right elbow surgery     x 2  . right knee arthroscopy    . squamous cell skin cancer     Left Hand  . SUPRAVENTRICULAR TACHYCARDIA ABLATION N/A 01/16/2014   Procedure: SUPRAVENTRICULAR TACHYCARDIA ABLATION;  Surgeon: Evans Lance, MD;  Location: [facility] CATH LAB;  Service: Cardiovascular;  Laterality: N/A;      Social History:      Social History   Tobacco Use  . Smoking status: Former Smoker    Packs/day: 2.50    Years: 50.00    Pack years: 125.00    Types: Cigarettes    Quit date: 05/05/2005    Years since quitting: 13.6  . Smokeless tobacco: Former Systems developer    Quit date: 05/03/2012  Substance Use Topics  . Alcohol use: No    Alcohol/week: 0.0 standard drinks    Comment: last use 2013       Family History :     Family History  Problem Relation Age of Onset  . Colon cancer Mother   . Colon cancer Maternal Aunt   . Colon cancer Maternal Uncle   . Heart attack Neg Hx   . Stroke Neg Hx       Home Medications:   Prior to Admission medications   Medication Sig Start Date End Date Taking? Authorizing Provider  albuterol (VENTOLIN HFA) 108 (90 Base) MCG/ACT inhaler Inhale 2 puffs into the lungs every 6 (six) hours as needed for wheezing or shortness of breath. 12/19/18   Manuella Ghazi, Pratik D, DO  alprazolam [name]) 2 MG tablet Take 1 tablet (2 mg total) by mouth 2 (two) times daily. 05/02/14   Theodis Blaze, MD  amitriptyline (ELAVIL) 100 MG tablet Take 100 mg by mouth at bedtime.    [provider]  amLODipine (NORVASC) 2.5 MG tablet TAKE 1 TABLET BY MOUTH  DAILY  Patient taking differently: Take 2.5 mg by mouth daily.  04/10/17   Croitoru, Dani Gobble, MD  aspirin EC 81 MG tablet Take 1 tablet (81 mg total) by mouth daily. Or Enteric Coated (EC) 81 mg 11/23/14   Barrett, Evelene Croon, PA-C  atorvastatin (LIPITOR) 40 MG tablet Take 1 tablet (40 mg total) by mouth daily at 6 PM. 12/19/18 01/18/19  Manuella Ghazi, Pratik D, DO  bisacodyl (DULCOLAX) 5 MG EC tablet Take 5 mg by mouth daily as needed for mild constipation or moderate constipation.    [provider]  budesonide-formoterol (SYMBICORT) 80-4.5 MCG/ACT inhaler Inhale 2 puffs into the lungs every morning. 12/19/18   Manuella Ghazi, Pratik D, DO  docusate sodium (COLACE) 100 MG capsule Take 100 mg by mouth daily as needed for mild constipation.    [provider]  finasteride (PROSCAR) 5 MG tablet Take 5 mg by mouth every morning.     [provider]  gabapentin (NEURONTIN) 800 MG tablet Take 1 tablet (800 mg total) by mouth 3 (three) times daily. 08/23/18   Ward Givens, NP  guaiFENesin (MUCINEX) 600 MG 12 hr tablet Take 600 mg by mouth 2 (two) times daily.     [provider]  latanoprost (XALATAN) 0.005 % ophthalmic solution Place 1 drop into both eyes at bedtime.    [provider]  metoprolol tartrate (LOPRESSOR) 25 MG tablet Take 12.5 mg by mouth 2 (two) times daily.    [provider]  nitroGLYCERIN (NITROSTAT) 0.4 MG SL tablet Place 1 tablet (0.4 mg total) under the tongue every 5 (five) minutes x 3 doses as needed for chest pain. 04/20/14   [name], NP  tamsulosin (FLOMAX) 0.4 MG CAPS capsule Take 0.4 mg by mouth at bedtime.    [provider]  lamoTRIgine (LAMICTAL) 100 MG tablet Take 1 tablet (100 mg total) by mouth 2 (two) times daily. 01/23/16 01/29/16  Kathrynn Ducking, MD     Allergies:     Allergies  Allergen Reactions  . Codeine Itching  . Trileptal [Oxcarbazepine]     Other reaction(s): Other (See Comments) Abnormal sodium levels  Hyponatremia     Physical Exam:   Vitals  Blood pressure 138/64, pulse 100, temperature 98 F (36.7 C), temperature source Oral, resp. rate 20, SpO2 95 %.  1.  General: Patient lying supine in bed with BiPAP on  2. Psychiatric: Unable to examine due to patient's altered mental status  3. Neurologic: Patient cannot cooperate with exam.  4. HEENMT:  Head is atraumatic normocephalic, mucous membranes appear dry, pupils are reactive to light  5. Respiratory : Crackles present bilaterally more on the right than the left.  Patient on BiPAP 16/8.  6. Cardiovascular : Regular rate and rhythm, venous stasis changes present in the bilateral distal lower extremities.  2+ pitting edema in lower extremities bilaterally  7. Gastrointestinal:  Abdomen is soft minimally distended and nontender to palpation        Data Review:    CBC Recent Labs  Lab 01/01/19 2327  WBC 8.2  HGB 14.2  HCT 51.1  PLT 216  MCV 103.4*  MCH 28.7  MCHC 27.8*  RDW 20.8*  LYMPHSABS 0.4*  MONOABS 0.9  EOSABS 0.1  BASOSABS 0.0   ------------------------------------------------------------------------------------------------------------------  Results for orders placed or performed during the hospital encounter of 01/01/19 (from the past 48 hour(s))  SARS Coronavirus 2 [facility] order, Performed in [facility] hospital lab) Nasopharyngeal Nasopharyngeal Swab     Status: None   Collection Time: 01/01/19 10:21 PM   Specimen: Nasopharyngeal Swab  Result Value Ref Range  SARS Coronavirus 2 NEGATIVE NEGATIVE    Comment: (NOTE) If result is NEGATIVE SARS-CoV-2 target nucleic acids are NOT DETECTED. The SARS-CoV-2 RNA is generally detectable in upper and lower  respiratory specimens during the acute phase of infection. The lowest  concentration of SARS-CoV-2 viral copies this assay can detect is 250  copies / mL. A negative result does not preclude SARS-CoV-2 infection  and should not be used as  the sole basis for treatment or other  patient management decisions.  A negative result may occur with  improper specimen collection / handling, submission of specimen other  than nasopharyngeal swab, presence of viral mutation(s) within the  areas targeted by this assay, and inadequate number of viral copies  (<250 copies / mL). A negative result must be combined with clinical  observations, patient history, and epidemiological information. If result is POSITIVE SARS-CoV-2 target nucleic acids are DETECTED. The SARS-CoV-2 RNA is generally detectable in upper and lower  respiratory specimens dur ing the acute phase of infection.  Positive  results are indicative of active infection with SARS-CoV-2.  Clinical  correlation with patient history and other diagnostic information is  necessary to determine patient infection status.  Positive results do  not rule out bacterial infection or co-infection with other viruses. If result is PRESUMPTIVE POSTIVE SARS-CoV-2 nucleic acids MAY BE PRESENT.   A presumptive positive result was obtained on the submitted specimen  and confirmed on repeat testing.  While 2019 novel coronavirus  (SARS-CoV-2) nucleic acids may be present in the submitted sample  additional confirmatory testing may be necessary for epidemiological  and / or clinical management purposes  to differentiate between  SARS-CoV-2 and other Sarbecovirus currently known to infect humans.  If clinically indicated additional testing with an alternate test  methodology [phone]) is advised. The SARS-CoV-2 RNA is generally  detectable in upper and lower respiratory sp ecimens during the acute  phase of infection. The expected result is Negative. Fact Sheet for Patients:  StrictlyIdeas.no Fact Sheet for Healthcare Providers: BankingDealers.co.za This test is not yet approved or cleared by the [place] FDA and has been authorized for  detection and/or diagnosis of SARS-CoV-2 by FDA under an Emergency Use Authorization (EUA).  This EUA will remain in effect (meaning this test can be used) for the duration of the COVID-19 declaration under Section 564(b)(1) of the Act, 21 U.S.C. section 360bbb-3(b)(1), unless the authorization is terminated or revoked sooner. Performed at [facility], [address], [place], [place] [address]   Comprehensive metabolic panel     Status: Abnormal   Collection Time: 01/01/19 10:35 PM  Result Value Ref Range   Sodium 142 135 - 145 mmol/L   Potassium 5.8 (H) 3.5 - 5.1 mmol/L   Chloride 100 98 - 111 mmol/L   CO2 35 (H) 22 - 32 mmol/L   Glucose, Bld 100 (H) 70 - 99 mg/dL   BUN 46 (H) 8 - 23 mg/dL   Creatinine, Ser 1.88 (H) 0.61 - 1.24 mg/dL   Calcium 8.7 (L) 8.9 - 10.3 mg/dL   Total Protein 6.6 6.5 - 8.1 g/dL   Albumin 3.1 (L) 3.5 - 5.0 g/dL   AST 28 15 - 41 U/L   ALT 32 0 - 44 U/L   Alkaline Phosphatase 72 38 - 126 U/L   Total Bilirubin 0.6 0.3 - 1.2 mg/dL   GFR calc non Af Amer 34 (L) >60 mL/min   GFR calc Af Amer 40 (L) >60 mL/min   Anion gap  7 5 - 15    Comment: Performed at [facility], [address], [place], [place] [address]  Troponin I (High Sensitivity)     Status: Abnormal   Collection Time: 01/01/19 10:35 PM  Result Value Ref Range   Troponin I (High Sensitivity) 20 (H) <18 ng/L    Comment: (NOTE) Elevated high sensitivity troponin I (hsTnI) values and significant  changes across serial measurements may suggest ACS but many other  chronic and acute conditions are known to elevate hsTnI results.  Refer to the "Links" section for chest pain algorithms and additional  guidance. Performed at [facility], [address] [place], [place] [address]   Lactic acid, plasma     Status: None   Collection Time: 01/01/19 10:35 PM  Result Value Ref Range   Lactic Acid, Venous 1.0 0.5 - 1.9 mmol/L    Comment: Performed at [facility], [address], [place], [place] [address]   CBC with Differential/Platelet     Status: Abnormal   Collection Time: 01/01/19 11:27 PM  Result Value Ref Range   WBC 8.2 4.0 - 10.5 K/uL   RBC 4.94 4.22 - 5.81 MIL/uL   Hemoglobin 14.2 13.0 - 17.0 g/dL   HCT 51.1 39.0 - 52.0 %   MCV 103.4 (H) 80.0 - 100.0 fL   MCH 28.7 26.0 - 34.0 pg   MCHC 27.8 (L) 30.0 - 36.0 g/dL   RDW 20.8 (H) 11.5 - 15.5 %   Platelets 216 150 - 400 K/uL   nRBC 3.3 (H) 0.0 - 0.2 %   Neutrophils Relative % 80 %   Neutro Abs 6.6 1.7 - 7.7 K/uL   Lymphocytes Relative 5 %   Lymphs Abs 0.4 (L) 0.7 - 4.0 K/uL   Monocytes Relative 11 %   Monocytes Absolute 0.9 0.1 - 1.0 K/uL   Eosinophils Relative 1 %   Eosinophils Absolute 0.1 0.0 - 0.5 K/uL   Basophils Relative 1 %   Basophils Absolute 0.0 0.0 - 0.1 K/uL   Immature Granulocytes 2 %   Abs Immature Granulocytes 0.14 (H) 0.00 - 0.07 K/uL    Comment: Performed at [facility], [address], [place], [place] [address]  Brain natriuretic peptide     Status: Abnormal   Collection Time: 01/01/19 11:27 PM  Result Value Ref Range   B Natriuretic Peptide 885.0 (H) 0.0 - 100.0 pg/mL    Comment: Performed at [facility], [address], [place], [place] [address]  Urinalysis, Routine w reflex microscopic     Status: Abnormal   Collection Time: 01/02/19 12:46 AM  Result Value Ref Range   Color, Urine AMBER (A) YELLOW    Comment: BIOCHEMICALS MAY BE AFFECTED BY COLOR   APPearance HAZY (A) CLEAR   Specific Gravity, Urine 1.026 1.005 - 1.030   pH 5.0 5.0 - 8.0   Glucose, UA NEGATIVE NEGATIVE mg/dL   Hgb urine dipstick NEGATIVE NEGATIVE   Bilirubin Urine NEGATIVE NEGATIVE   Ketones, ur NEGATIVE NEGATIVE mg/dL   Protein, ur 100 (A) NEGATIVE mg/dL   Nitrite NEGATIVE NEGATIVE   Leukocytes,Ua NEGATIVE NEGATIVE   RBC / HPF 0-5 0 - 5 RBC/hpf   WBC, UA 0-5 0 - 5 WBC/hpf   Bacteria, UA RARE (A) NONE SEEN   Squamous Epithelial / LPF 0-5 0 - 5   Mucus PRESENT    Hyaline Casts, UA PRESENT     Comment: Performed at [facility], [address], [place], [place] [address]  Lactic  acid, plasma     Status: None   Collection Time: 01/02/19  1:00 AM  Result Value Ref Range   Lactic Acid, Venous 1.0 0.5 - 1.9 mmol/L    Comment: Performed at [facility], [address], [place], [place] [address]  Troponin I (High Sensitivity)     Status: Abnormal   Collection Time: 01/02/19  1:00 AM  Result Value Ref Range   Troponin I (High Sensitivity) 21 (H) <18 ng/L    Comment: (NOTE) Elevated high sensitivity troponin I (hsTnI) values and significant  changes across serial measurements may suggest ACS but many other  chronic and acute conditions are known to elevate hsTnI results.  Refer to the "Links" section for chest pain algorithms and additional  guidance. Performed at [facility], [address], [place], [place] [address]   Blood gas, arterial     Status: Abnormal   Collection Time: 01/02/19  1:24 AM  Result Value Ref Range   FIO2 36.00    pH, Arterial 7.173 (LL) 7.350 - 7.450    Comment: CRITICAL RESULT CALLED TO, READ BACK BY AND VERIFIED WITH: PRUITT,G @ 0146 ON 01/02/19 BY JUW    pCO2 arterial 111 (HH) 32.0 - 48.0 mmHg    Comment: CRITICAL RESULT CALLED TO, READ BACK BY AND VERIFIED WITH: PRUITT,G @ 0146 ON 01/02/19 BY JUW    pO2, Arterial 74.3 (L) 83.0 - 108.0 mmHg   Bicarbonate 29.5 (H) 20.0 - 28.0 mmol/L   Acid-Base Excess 10.6 (H) 0.0 - 2.0 mmol/L   O2 Saturation 92.0 %   Patient temperature 37.0    Allens test (pass/fail) PASS PASS    Comment: Performed at [facility], [address], [place], [place] [address]  Blood gas, arterial     Status: Abnormal   Collection Time: 01/02/19  3:20 AM  Result Value Ref Range   FIO2 35.00    pH, Arterial 7.212 (L) 7.350 - 7.450   pCO2 arterial 103 (HH) 32.0 - 48.0 mmHg    Comment: CRITICAL RESULT CALLED TO, READ BACK BY AND VERIFIED WITH: PRUITT,G @ 0331 ON 01/02/19 BY JUW    pO2, Arterial 61.8 (L) 83.0 - 108.0 mmHg   Bicarbonate 30.8 (H) 20.0 - 28.0  mmol/L   Acid-Base Excess 11.8 (H) 0.0 - 2.0 mmol/L   O2 Saturation 88.3 %   Patient temperature 37.0    Allens test (pass/fail) PASS PASS    Comment: Performed at [facility] [address] [place], [place] [address]    Chemistries  Recent Labs  Lab 01/01/19 2235  NA 142  K 5.8*  CL 100  CO2 35*  GLUCOSE 100*  BUN 46*  CREATININE 1.88*  CALCIUM 8.7*  AST 28  ALT 32  ALKPHOS 72  BILITOT 0.6   ------------------------------------------------------------------------------------------------------------------  ------------------------------------------------------------------------------------------------------------------ GFR: CrCl cannot be calculated (Unknown ideal weight.). Liver Function Tests: Recent Labs  Lab 01/01/19 2235  AST 28  ALT 32  ALKPHOS 72  BILITOT 0.6  PROT 6.6  ALBUMIN 3.1*   No results for input(s): LIPASE, AMYLASE in the last 168 hours. No results for input(s): AMMONIA in the last 168 hours. Coagulation Profile: No results for input(s): INR, PROTIME in the last 168 hours. Cardiac Enzymes: No results for input(s): CKTOTAL, CKMB, CKMBINDEX, TROPONINI in the last 168 hours. BNP (last 3 results) No results for input(s): PROBNP in the last 8760 hours. HbA1C: No results for input(s): HGBA1C in the last 72 hours. CBG: No results for input(s): GLUCAP in the  last 168 hours. Lipid Profile: No results for input(s): CHOL, HDL, LDLCALC, TRIG, CHOLHDL, LDLDIRECT in the last 72 hours. Thyroid Function Tests: No results for input(s): TSH, T4TOTAL, FREET4, T3FREE, THYROIDAB in the last 72 hours. Anemia Panel: No results for input(s): VITAMINB12, FOLATE, FERRITIN, TIBC, IRON, RETICCTPCT in the last 72 hours.  --------------------------------------------------------------------------------------------------------------- Urine analysis:    Component Value Date/Time   COLORURINE AMBER (A) 01/02/2019 0046   APPEARANCEUR HAZY (A) 01/02/2019 0046    LABSPEC 1.026 01/02/2019 0046   PHURINE 5.0 01/02/2019 0046   GLUCOSEU NEGATIVE 01/02/2019 0046   HGBUR NEGATIVE 01/02/2019 0046   BILIRUBINUR NEGATIVE 01/02/2019 0046   KETONESUR NEGATIVE 01/02/2019 0046   PROTEINUR 100 (A) 01/02/2019 0046   UROBILINOGEN 0.2 02/12/2015 1859   NITRITE NEGATIVE 01/02/2019 0046   LEUKOCYTESUR NEGATIVE 01/02/2019 0046      Imaging Results:    Dg Chest Port 1 View  Result Date: 01/01/2019 CLINICAL DATA:  73 year old male with shortness of breath EXAM: PORTABLE CHEST 1 VIEW COMPARISON:  Chest radiograph dated 12/15/2018 FINDINGS: There is cardiomegaly with vascular congestion. There is shallow inspiration. Small bilateral pleural effusions and bibasilar densities similar to prior radiograph which may represent atelectasis or infiltrate. No pneumothorax. Stable cardiomegaly. No acute osseous pathology. IMPRESSION: 1. Cardiomegaly with vascular congestion. 2. Small bilateral pleural effusions and bibasilar densities similar to prior radiograph. Electronically Signed   By: Anner Crete M.D.   On: 01/01/2019 23:20    My personal review of EKG: Rhythm NSR, Rate 80/min, QTc 441,no Acute ST significant changes   Assessment & Plan:    Active Problems:   COPD exacerbation ([place])   1. Acute on chronic toxic and hypercarbic respiratory failure 1. Due to COPD 2. ABG shows elevated PCO2 and abnormally low PO2 3. BiPAP is on at 16/8 with respiratory therapy titration 4. Repeat ABG in the morning 5. Continue IV steroids 6. Start antibiotic therapy-given patient's recent hospitalization he is a Pseudomonas risk so we will start with Levaquin 7. Monitor with continuous pulse oximetry 8. Admit to stepdown unit 2. Respiratory acidosis with metabolic compensation 1. Continue to monitor with serial ABGs 2. See plan for #1 3. Metabolic encephalopathy 1. Likely secondary to #1 and #2 2. See plan above 4. COPD exacerbation 1. See plan for #1 5. CHF exacerbation  1. Patient's last echo was in 2015 2. Echo ordered for morning 3. Lasix given in ED 4. 1 more dose of Lasix to be given 5. Insert Foley catheter for accurate I's and O's 6. Weight patient daily 7. Dietitian consult for dietary education.  Especially salt restriction. 8. CHF exacerbation is unlikely secondary to heart strain from COPD exacerbation 9. Ischemic cause cannot be ruled out as troponin was elevated 10. Continue to trend troponins until they have peaked 11. Repeat EKG in the morning 6. Hyperkalemia 1. Likely secondary to renal injury 2. Continue to monitor EKG for hyperkalemic related changes 3. Patient has DuoNeb scheduled- this should help with shift of potassium from the albuterol 4. Will give calcium to stabilize the membranes 5. Will give insulin and D5 6. We will give 1 more dose of loop diuretic 7. Keep patient on continuous cardiac monitoring with telemetry 7. AK I 1. Likely secondary to heart failure 2. BUN is 46 and creatinine is 1.88 3. Creatinine 2 weeks ago was 1.02 4. Patient also has elevated potassium 5. Monitor labs closely 6. Gentle diuresis 8. Elevated troponin 1. Continue to trend until peaked 2. Patient has history of  CAD continue statin aspirin beta-blocker and Nitrostat from home medications 3. Continue telemetry 9. Protein calorie malnutrition 1. When patient is able to take PO - advise about nutrient dense foods 2. Dietician consult 10. CAD 1. Continue home medications   DVT Prophylaxis-  Heparin SubQ SCDs.   AM Labs Ordered, also please review Full Orders  Family Communication: Admission, patients condition and plan of care including tests being ordered have not be discussed with patient due to altered mental status.  Code Status:  DNR  Admission status: Inpatient: Based on patients clinical presentation and evaluation of above clinical data, I have made determination that patient meets Inpatient criteria at this time.  Time spent in  minutes : 72   Takasha Vetere B Zierle-Ghosh M.D on 01/02/2019 at 3:45 AM

## 2019-01-02 NOTE — ED Provider Notes (Signed)
[facility] EMERGENCY DEPARTMENT Provider Note   CSN: [id number] Arrival date & time: 01/01/19  2147   Time seen 11:30 PM  History   Chief Complaint Chief Complaint  Patient presents with  . Shortness of Breath    HPI Curtis [name] is a 73 y.o. male.   Level 5 caveat for altered mental status  HPI patient was recently admitted to the hospital for COPD, pulmonary asbestosis, peripheral vascular disease, and pneumonia complicated by sepsis.  He was discharged home from the hospital on oxygen with 4 L/min nasal cannula at rest and 8 L with ambulation.  His son called EMS tonight for patient being short of breath.  EMS reports his pulse ox was 65% on room air when they arrived.  Son states he has been using his oxygen.  He also reports he has been confused over the past 2 weeks is gotten worse over the past 2 days.  Patient really is not able to answer any questions.  PCP [name], MD   Past Medical History:  Diagnosis Date  . AAA (abdominal aortic aneurysm), stable 04/20/2014  . Arthritis   . 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 Cancer ([place])   . Depression   . Diverticulitis    hospital 2011 [facility]  . Diverticulosis   . GERD (gastroesophageal reflux disease)   . 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 Hernia of unspecified site of abdominal cavity without mention of obstruction or gangrene    hiatal  . Hyperlipidemia LDL goal <70 04/20/2014  . Hypertension   . IBS (irritable bowel syndrome)   . Myocardial infarction ([place]) 01/2014   NSTEMI  . Peripheral vascular disease (HCC)    ankle brachial index of 0.79 on the right and 0.61 on the left.   . 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 Reflex sympathetic dystrophy   . 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 SOB (shortness of breath)     Patient Active Problem List   Diagnosis Date Noted  . Sepsis ([place]) 12/15/2018  . Carotid artery disease ([place]) 02/27/2016  . Acute hyponatremia 02/12/2015  . Hereditary and idiopathic peripheral  neuropathy 01/30/2015  . Acute on chronic respiratory failure ([place]) 05/08/2014  . Anemia, iron deficiency 05/08/2014  . Hyponatremia 05/08/2014  . Urinary retention 05/08/2014  . Chronic respiratory failure with hypoxia ([place]) 04/30/2014  . Shortness of breath   . Aspiration into airway   . HCAP (healthcare-associated pneumonia) 04/24/2014  . Hyperlipidemia LDL goal <70 04/20/2014  . AAA (abdominal aortic aneurysm), stable 04/20/2014  . Abdominal pain, generalized   . Chest pain   . SOB (shortness of breath)   . DOE (dyspnea on exertion), may be Brilinta this was stopped changed to Plavix 04/18/2014  . Peripheral vascular disease ([place])   . Presence of drug coated stent in left circumflex coronary artery: Promus DES 3.5 mm x 38 mm (3.9 mm) 01/17/2014    Class: Acute  . CAD (coronary artery disease), native coronary artery   . SVT (supraventricular tachycardia) s/p ablation 01/16/2014 01/16/2014  . 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  . TIA (transient ischemic attack) 09/06/2013  . HTN (hypertension) 09/05/2013  . TOBACCO ABUSE, hx 08/23/2007  . COPD with chronic bronchitis ([place]) 08/12/2007  . 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 Reflex sympathetic dystrophy 05/29/2007  . GERD 05/29/2007    Past Surgical History:  Procedure Laterality Date  . CARDIAC CATHETERIZATION    . CATARACT EXTRACTION    . CORONARY ANGIOPLASTY    .  HERNIA REPAIR    . LEFT HEART CATHETERIZATION WITH CORONARY ANGIOGRAM N/A 01/17/2014   Procedure: LEFT HEART CATHETERIZATION WITH CORONARY ANGIOGRAM;  Surgeon: Leonie Man, MD;  Location: [facility] CATH LAB;  Service: Cardiovascular;  Laterality: N/A;  . left shoulder      x 3  . NISSEN FUNDOPLICATION    . right elbow surgery     x 2  . right knee arthroscopy    . squamous cell skin cancer     Left Hand  . SUPRAVENTRICULAR TACHYCARDIA ABLATION N/A 01/16/2014   Procedure: SUPRAVENTRICULAR TACHYCARDIA ABLATION;  Surgeon: Evans Lance, MD;  Location: [facility]  CATH LAB;  Service: Cardiovascular;  Laterality: N/A;        Home Medications    Prior to Admission medications   Medication Sig Start Date End Date Taking? Authorizing Provider  albuterol (VENTOLIN HFA) 108 (90 Base) MCG/ACT inhaler Inhale 2 puffs into the lungs every 6 (six) hours as needed for wheezing or shortness of breath. 12/19/18   Manuella Ghazi, Pratik D, DO  alprazolam [name]) 2 MG tablet Take 1 tablet (2 mg total) by mouth 2 (two) times daily. 05/02/14   Theodis Blaze, MD  amitriptyline (ELAVIL) 100 MG tablet Take 100 mg by mouth at bedtime.    [provider]  amLODipine (NORVASC) 2.5 MG tablet TAKE 1 TABLET BY MOUTH  DAILY Patient taking differently: Take 2.5 mg by mouth daily.  04/10/17   Croitoru, Mihai, MD  aspirin EC 81 MG tablet Take 1 tablet (81 mg total) by mouth daily. Or Enteric Coated (EC) 81 mg 11/23/14   Barrett, Evelene Croon, PA-C  atorvastatin (LIPITOR) 40 MG tablet Take 1 tablet (40 mg total) by mouth daily at 6 PM. 12/19/18 01/18/19  Manuella Ghazi, Pratik D, DO  bisacodyl (DULCOLAX) 5 MG EC tablet Take 5 mg by mouth daily as needed for mild constipation or moderate constipation.    [provider]  budesonide-formoterol (SYMBICORT) 80-4.5 MCG/ACT inhaler Inhale 2 puffs into the lungs every morning. 12/19/18   Manuella Ghazi, Pratik D, DO  docusate sodium (COLACE) 100 MG capsule Take 100 mg by mouth daily as needed for mild constipation.    [provider]  finasteride (PROSCAR) 5 MG tablet Take 5 mg by mouth every morning.     [provider]  gabapentin (NEURONTIN) 800 MG tablet Take 1 tablet (800 mg total) by mouth 3 (three) times daily. 08/23/18   Ward Givens, NP  guaiFENesin (MUCINEX) 600 MG 12 hr tablet Take 600 mg by mouth 2 (two) times daily.     [provider]  latanoprost (XALATAN) 0.005 % ophthalmic solution Place 1 drop into both eyes at bedtime.    [provider]  metoprolol tartrate (LOPRESSOR) 25 MG tablet Take 12.5 mg by mouth  2 (two) times daily.    [provider]  nitroGLYCERIN (NITROSTAT) 0.4 MG SL tablet Place 1 tablet (0.4 mg total) under the tongue every 5 (five) minutes x 3 doses as needed for chest pain. 04/20/14   [name], NP  tamsulosin (FLOMAX) 0.4 MG CAPS capsule Take 0.4 mg by mouth at bedtime.    [provider]  lamoTRIgine (LAMICTAL) 100 MG tablet Take 1 tablet (100 mg total) by mouth 2 (two) times daily. 01/23/16 01/29/16  Kathrynn Ducking, MD    Family History Family History  Problem Relation Age of Onset  . Colon cancer Mother   . Colon cancer Maternal Aunt   . Colon cancer Maternal  Uncle   . Heart attack Neg Hx   . Stroke Neg Hx     Social History Social History   Tobacco Use  . Smoking status: Former Smoker    Packs/day: 2.50    Years: 50.00    Pack years: 125.00    Types: Cigarettes    Quit date: 05/05/2005    Years since quitting: 13.6  . Smokeless tobacco: Former Systems developer    Quit date: 05/03/2012  Substance Use Topics  . Alcohol use: No    Alcohol/week: 0.0 standard drinks    Comment: last use 2013  . Drug use: No  lives at home Lives alone   Allergies   Codeine and Trileptal [oxcarbazepine]   Review of Systems Review of Systems  Unable to perform ROS: Mental status change     Physical Exam Updated Vital Signs BP (!) 147/71   Pulse 99   Temp 98 F (36.7 C) (Oral)   Resp 18   SpO2 92%   Physical Exam Vitals signs and nursing note reviewed.  Constitutional:      Comments: Patient's eyes are open, he seems to have trouble comprehending what is being asked.  When he speaks he speaks very softly  HENT:     Head: Normocephalic and atraumatic.     Right Ear: External ear normal.     Left Ear: External ear normal.     Nose: Nose normal.  Eyes:     Extraocular Movements: Extraocular movements intact.     Conjunctiva/sclera: Conjunctivae normal.  Neck:     Musculoskeletal: Normal range of motion.  Cardiovascular:     Rate and Rhythm:  Tachycardia present.  Pulmonary:     Effort: Tachypnea, accessory muscle usage, prolonged expiration and respiratory distress present.     Breath sounds: Decreased air movement present. Rales present.  Musculoskeletal:     Right lower leg: Edema present.     Left lower leg: Edema present.  Skin:    Comments: Patient's feet and fingers appear purplish in color and are cool to touch.  Patient's lips are blue.  Neurological:     Comments: Patient is status post stroke with some right-sided weakness and right facial droop which is old  Psychiatric:        Mood and Affect: Affect is flat.        Speech: Speech is delayed.        Behavior: Behavior is slowed.      ED Treatments / Results  Labs (all labs ordered are listed, but only abnormal results are displayed) Results for orders placed or performed during the hospital encounter of 01/01/19  SARS Coronavirus 2 [facility] order, Performed in [facility] hospital lab) Nasopharyngeal Nasopharyngeal Swab   Specimen: Nasopharyngeal Swab  Result Value Ref Range   SARS Coronavirus 2 NEGATIVE NEGATIVE  Comprehensive metabolic panel  Result Value Ref Range   Sodium 142 135 - 145 mmol/L   Potassium 5.8 (H) 3.5 - 5.1 mmol/L   Chloride 100 98 - 111 mmol/L   CO2 35 (H) 22 - 32 mmol/L   Glucose, Bld 100 (H) 70 - 99 mg/dL   BUN 46 (H) 8 - 23 mg/dL   Creatinine, Ser 1.88 (H) 0.61 - 1.24 mg/dL   Calcium 8.7 (L) 8.9 - 10.3 mg/dL   Total Protein 6.6 6.5 - 8.1 g/dL   Albumin 3.1 (L) 3.5 - 5.0 g/dL   AST 28 15 - 41 U/L   ALT 32 0 - 44 U/L  Alkaline Phosphatase 72 38 - 126 U/L   Total Bilirubin 0.6 0.3 - 1.2 mg/dL   GFR calc non Af Amer 34 (L) >60 mL/min   GFR calc Af Amer 40 (L) >60 mL/min   Anion gap 7 5 - 15  Lactic acid, plasma  Result Value Ref Range   Lactic Acid, Venous 1.0 0.5 - 1.9 mmol/L  Lactic acid, plasma  Result Value Ref Range   Lactic Acid, Venous 1.0 0.5 - 1.9 mmol/L  CBC with Differential/Platelet  Result Value Ref Range    WBC 8.2 4.0 - 10.5 K/uL   RBC 4.94 4.22 - 5.81 MIL/uL   Hemoglobin 14.2 13.0 - 17.0 g/dL   HCT 51.1 39.0 - 52.0 %   MCV 103.4 (H) 80.0 - 100.0 fL   MCH 28.7 26.0 - 34.0 pg   MCHC 27.8 (L) 30.0 - 36.0 g/dL   RDW 20.8 (H) 11.5 - 15.5 %   Platelets 216 150 - 400 K/uL   nRBC 3.3 (H) 0.0 - 0.2 %   Neutrophils Relative % 80 %   Neutro Abs 6.6 1.7 - 7.7 K/uL   Lymphocytes Relative 5 %   Lymphs Abs 0.4 (L) 0.7 - 4.0 K/uL   Monocytes Relative 11 %   Monocytes Absolute 0.9 0.1 - 1.0 K/uL   Eosinophils Relative 1 %   Eosinophils Absolute 0.1 0.0 - 0.5 K/uL   Basophils Relative 1 %   Basophils Absolute 0.0 0.0 - 0.1 K/uL   Immature Granulocytes 2 %   Abs Immature Granulocytes 0.14 (H) 0.00 - 0.07 K/uL  Brain natriuretic peptide  Result Value Ref Range   B Natriuretic Peptide 885.0 (H) 0.0 - 100.0 pg/mL  Blood gas, arterial  Result Value Ref Range   FIO2 36.00    pH, Arterial 7.173 (LL) 7.350 - 7.450   pCO2 arterial 111 (HH) 32.0 - 48.0 mmHg   pO2, Arterial 74.3 (L) 83.0 - 108.0 mmHg   Bicarbonate 29.5 (H) 20.0 - 28.0 mmol/L   Acid-Base Excess 10.6 (H) 0.0 - 2.0 mmol/L   O2 Saturation 92.0 %   Patient temperature 37.0    Allens test (pass/fail) PASS PASS  Urinalysis, Routine w reflex microscopic  Result Value Ref Range   Color, Urine AMBER (A) YELLOW   APPearance HAZY (A) CLEAR   Specific Gravity, Urine 1.026 1.005 - 1.030   pH 5.0 5.0 - 8.0   Glucose, UA NEGATIVE NEGATIVE mg/dL   Hgb urine dipstick NEGATIVE NEGATIVE   Bilirubin Urine NEGATIVE NEGATIVE   Ketones, ur NEGATIVE NEGATIVE mg/dL   Protein, ur 100 (A) NEGATIVE mg/dL   Nitrite NEGATIVE NEGATIVE   Leukocytes,Ua NEGATIVE NEGATIVE   RBC / HPF 0-5 0 - 5 RBC/hpf   WBC, UA 0-5 0 - 5 WBC/hpf   Bacteria, UA RARE (A) NONE SEEN   Squamous Epithelial / LPF 0-5 0 - 5   Mucus PRESENT    Hyaline Casts, UA PRESENT   Troponin I (High Sensitivity)  Result Value Ref Range   Troponin I (High Sensitivity) 20 (H) <18 ng/L  Troponin  I (High Sensitivity)  Result Value Ref Range   Troponin I (High Sensitivity) 21 (H) <18 ng/L   Laboratory interpretation all normal except elevated BNP,    EKG EKG Interpretation  Date/Time:  Saturday January 01 2019 22:02:22 EDT Ventricular Rate:  80 PR Interval:    QRS Duration: 136 QT Interval:  382 QTC Calculation: 441 R Axis:   76 Text Interpretation:  Atrial flutter  Right bundle branch block Minimal ST elevation, inferior leads Confirmed by Nat Christen [phone]) on 01/01/2019 10:45:38 PM   Radiology Dg Chest Port 1 View  Result Date: 01/01/2019 CLINICAL DATA:  73 year old male with shortness of breath EXAM: PORTABLE CHEST 1 VIEW COMPARISON:  Chest radiograph dated 12/15/2018 FINDINGS: There is cardiomegaly with vascular congestion. There is shallow inspiration. Small bilateral pleural effusions and bibasilar densities similar to prior radiograph which may represent atelectasis or infiltrate. No pneumothorax. Stable cardiomegaly. No acute osseous pathology. IMPRESSION: 1. Cardiomegaly with vascular congestion. 2. Small bilateral pleural effusions and bibasilar densities similar to prior radiograph. Electronically Signed   By: Anner Crete M.D.   On: 01/01/2019 23:20    Procedures .Critical Care Performed by: Rolland Porter, MD Authorized by: Rolland Porter, MD   Critical care provider statement:    Critical care time (minutes):  38   Critical care was necessary to treat or prevent imminent or life-threatening deterioration of the following conditions:  Respiratory failure   Critical care was time spent personally by me on the following activities:  Discussions with consultants, examination of patient, obtaining history from patient or surrogate, ordering and review of laboratory studies, ordering and review of radiographic studies, pulse oximetry, re-evaluation of patient's condition and review of old charts   (including critical care time)  Medications Ordered in ED Medications   furosemide (LASIX) injection 60 mg (60 mg Intravenous Given 01/02/19 0015)  methylPREDNISolone sodium succinate (SOLU-MEDROL) 125 mg/2 mL injection 125 mg (125 mg Intravenous Given 01/02/19 0015)     Initial Impression / Assessment and Plan / ED Course  I have reviewed the triage vital signs and the nursing notes.  Pertinent labs & imaging results that were available during my care of the patient were reviewed by me and considered in my medical decision making (see chart for details).  At the time of my exam I had reviewed his chest x-ray and added BNP to his blood work.  Also taking into consideration his recent hospitalization he was given Lasix 60 mg IV, his creatinine had been normal 2 weeks ago, and he was given IV Solu-Medrol.  When he was discharged from the hospital he was discharged on Augmentin and prednisone.  ABG was done to try to further assess his cyanosis, however his pulse ox is 98% on 5 L.  ABG was delayed in being done.  After it resulted he has a significant respiratory acidosis.  He was placed on BiPAP.   03:05 AM Dr Clearence Ped, hospitalist, will admit  Repeat ABG done on Bipap Still having respiratory acidosis   Final Clinical Impressions(s) / ED Diagnoses   Final diagnoses:  Acute on chronic respiratory failure with hypoxia and hypercapnia (HCC)  Congestive heart failure, unspecified HF chronicity, unspecified heart failure type [facility])    Plan admission  Rolland Porter, MD, Barbette Or, MD 01/02/19 [phone]

## 2019-01-02 NOTE — Progress Notes (Signed)
Jeannette Corpus paged to see if patients Xanax could be ordered. MD note says to continue, but it was not ordered upon admit and pt is requesting. Waiting for call back/orders. Will continue to monitor pt

## 2019-01-02 NOTE — Progress Notes (Signed)
Patient is off BiPAP and will talk as he loves to talk. He is on 4 liters oxygen at home by record. He states he does not wear oxygen at home. He does retain PCO2 chronic. He is still having some issues with memory ( long term ) Other ?? He lives alone. Will try to place back on BiPAP HS if he will allow. Breath sounds decreased with rhonchi wheezes Hasn't coughed up sputum as of yet. Has hx of asbestos. Kidney output decreased

## 2019-01-02 NOTE — ED Notes (Signed)
CRITICAL VALUE ALERT  Critical Value: pCO2 103 Date & Time Notied:  01/02/19 @ [id number] Provider Notified: Dr Eliane Decree Orders Received/Actions taken: none yet

## 2019-01-02 NOTE — Progress Notes (Signed)
Placed back on BiPAP for sleep 14/6, f 8 ,32 FiO2. No problems encountered with return to BiPAP. Oxygen decreased -- BiPAP pressures decreased.

## 2019-01-02 NOTE — ED Notes (Signed)
CRITICAL VALUE ALERT  Critical Value:  Ph 7.173 and CO2 was 111 Date & Time Notied:  01/02/19 @ 0148 Provider Notified:Dr I Tomi Bamberger Orders Received/Actions taken: Bipap

## 2019-01-02 NOTE — Progress Notes (Signed)
*  PRELIMINARY RESULTS* Echocardiogram 2D Echocardiogram has been performed.  Leavy Cella 01/02/2019, 10:05 AM

## 2019-01-02 NOTE — Progress Notes (Addendum)
PROGRESS NOTE  Curtis [name] [id number] DOB: 05-29-1945 DOA: 01/01/2019 PCP: [name], MD  Brief History:  73 year old male with a history of pulmonary asbestosis, COPD, coronary artery disease, hypertension, hyperlipidemia, SVT status post ablation presenting with altered mental status.  The patient was recently admitted to the hospital from 12/15/2018 through 12/19/2018 for sepsis secondary to pneumonia, COPD exacerbation and acute on chronic respiratory failure.  He was discharged home with home health physical therapy, to finish Augmentin and prednisone taper.  The patient's son states that the patient has had a generalized weakness for about a week with some confusion.  This has worsened over the past 3 days to the point where the patient had not been taking any of his medications.  On the day of admission, the patient was somnolent and difficult to arouse.  EMS was activated, and the patient was noted to be somewhat cyanotic on his lips but arousable at that time.  In the emergency room, the patient was placed on BiPAP.  Chest x-ray showed vascular congestion.  He was started on steroids and intravenous furosemide.  The patient was afebrile hemodynamically stable with ABG initially showing 7.173/111/74 on 35%.  He was placed on BiPAP.  BNP was 885.  Troponin was 20>>> 21.  Lactic acid was 1.0.  Assessment/Plan: Acute on chronic respiratory failure with hypoxia and hypercarbia -Secondary to pulmonary edema and COPD exacerbation -Personally reviewed chest x-ray--bilateral pleural plaques.  Bilateral pleural effusion, right greater than left.  Increased interstitial markings -Wean off BiPAP back to baseline 4 L  COPD exacerbation -Start Pulmicort -Start IV Solu-Medrol -Start duo nebs  Acute CHF type unspecified -Work-up in progress -Echocardiogram -Daily weights -Strict I's and O's  Urinary retention -Place Foley catheter  Hyperkalemia -Patient received  temporizing measures -Improved with IV diuresis  Acute kidney injury -Baseline creatinine 0.9-1.2 -Presented with serum creatinine 1.8 -Monitor with diuresis  Coronary artery disease -No chest pain presently -Personally reviewed EKG--sinus rhythm with nonconducted atrial beats, nonspecific ST changes -Troponins not consistent with ACS  Anxiety/depression -Continue Xanax and Elavil  Essential hypertension -Holding amlodipine and monitor clinically -Continue metoprolol tartrate  F[place] -PT eval -[id number] -folic acid -TSH  Lower extremity edema, recurrent left -Venous duplex rule out DVT      Disposition Plan:   SNF in 2-3 days  Family Communication:  Son updated on phone 8/30  Consultants:  none  Code Status:  DNI  DVT Prophylaxis:  [place] Heparin    Procedures: As Listed in Progress Note Above  Antibiotics: None   The patient is critically ill with multiple organ systems failure and requires high complexity decision making for assessment and support, frequent evaluation and titration of therapies, application of advanced monitoring technologies and extensive interpretation of multiple databases.  Critical care time - 35 mins.     Subjective: Patient states that he is breathing better.  He still has some shortness of breath.  He denies any fevers, chills, headache, chest pain, nausea, vomiting, diarrhea, domino pain.  There is no dysuria.  Objective: Vitals:   01/02/19 0530 01/02/19 0600 01/02/19 0630 01/02/19 0700  BP: (!) 141/71 (!) 157/83 126/81 138/79  Pulse: 93 (!) 103 96 83  Resp: 18 16 16 16   Temp:      TempSrc:      SpO2: 94% 98% 98% 95%    Intake/Output Summary (Last 24 hours) at 01/02/2019 0732 Last data filed at 01/02/2019 0615 Gross  per 24 hour  Intake 43.03 ml  Output --  Net 43.03 ml   Weight change:  Exam:   General:  Pt is alert, follows commands appropriately, not in acute distress  HEENT: No icterus, No thrush, No neck  mass, [place]/AT  Cardiovascular: RRR, S1/S2, no rubs, no gallops  Respiratory: Bilateral scattered rhonchi.  Bibasilar wheeze.  Good air movement.  Diminished breath sounds bilateral.  Abdomen: Soft/+BS, non tender, non distended, no guarding  Extremities: 2+ lower extremity edema, No lymphangitis, No petechiae, No rashes, no synovitis   Data Reviewed: I have personally reviewed following labs and imaging studies Basic Metabolic Panel: Recent Labs  Lab 01/01/19 2235 01/02/19 0606 01/02/19 0607  NA 142  --   --   K 5.8* 4.9  --   CL 100  --   --   CO2 35*  --   --   GLUCOSE 100*  --  161*  BUN 46*  --   --   CREATININE 1.88*  --   --   CALCIUM 8.7*  --   --    Liver Function Tests: Recent Labs  Lab 01/01/19 2235  AST 28  ALT 32  ALKPHOS 72  BILITOT 0.6  PROT 6.6  ALBUMIN 3.1*   No results for input(s): LIPASE, AMYLASE in the last 168 hours. No results for input(s): AMMONIA in the last 168 hours. Coagulation Profile: No results for input(s): INR, PROTIME in the last 168 hours. CBC: Recent Labs  Lab 01/01/19 2327  WBC 8.2  NEUTROABS 6.6  HGB 14.2  HCT 51.1  MCV 103.4*  PLT 216   Cardiac Enzymes: No results for input(s): CKTOTAL, CKMB, CKMBINDEX, TROPONINI in the last 168 hours. BNP: Invalid input(s): POCBNP CBG: No results for input(s): GLUCAP in the last 168 hours. HbA1C: No results for input(s): HGBA1C in the last 72 hours. Urine analysis:    Component Value Date/Time   COLORURINE AMBER (A) 01/02/2019 0046   APPEARANCEUR HAZY (A) 01/02/2019 0046   LABSPEC 1.026 01/02/2019 0046   PHURINE 5.0 01/02/2019 0046   GLUCOSEU NEGATIVE 01/02/2019 0046   HGBUR NEGATIVE 01/02/2019 0046   BILIRUBINUR NEGATIVE 01/02/2019 0046   KETONESUR NEGATIVE 01/02/2019 0046   PROTEINUR 100 (A) 01/02/2019 0046   UROBILINOGEN 0.2 02/12/2015 1859   NITRITE NEGATIVE 01/02/2019 0046   LEUKOCYTESUR NEGATIVE 01/02/2019 0046   Sepsis  Labs: @LABRCNTIP (procalcitonin:4,lacticidven:4) ) Recent Results (from the past 240 hour(s))  SARS Coronavirus 2 [facility] order, Performed in [facility] hospital lab) Nasopharyngeal Nasopharyngeal Swab     Status: None   Collection Time: 01/01/19 10:21 PM   Specimen: Nasopharyngeal Swab  Result Value Ref Range Status   SARS Coronavirus 2 NEGATIVE NEGATIVE Final    Comment: (NOTE) If result is NEGATIVE SARS-CoV-2 target nucleic acids are NOT DETECTED. The SARS-CoV-2 RNA is generally detectable in upper and lower  respiratory specimens during the acute phase of infection. The lowest  concentration of SARS-CoV-2 viral copies this assay can detect is 250  copies / mL. A negative result does not preclude SARS-CoV-2 infection  and should not be used as the sole basis for treatment or other  patient management decisions.  A negative result may occur with  improper specimen collection / handling, submission of specimen other  than nasopharyngeal swab, presence of viral mutation(s) within the  areas targeted by this assay, and inadequate number of viral copies  (<250 copies / mL). A negative result must be combined with clinical  observations, patient history, and  epidemiological information. If result is POSITIVE SARS-CoV-2 target nucleic acids are DETECTED. The SARS-CoV-2 RNA is generally detectable in upper and lower  respiratory specimens dur ing the acute phase of infection.  Positive  results are indicative of active infection with SARS-CoV-2.  Clinical  correlation with patient history and other diagnostic information is  necessary to determine patient infection status.  Positive results do  not rule out bacterial infection or co-infection with other viruses. If result is PRESUMPTIVE POSTIVE SARS-CoV-2 nucleic acids MAY BE PRESENT.   A presumptive positive result was obtained on the submitted specimen  and confirmed on repeat testing.  While 2019 novel coronavirus  (SARS-CoV-2)  nucleic acids may be present in the submitted sample  additional confirmatory testing may be necessary for epidemiological  and / or clinical management purposes  to differentiate between  SARS-CoV-2 and other Sarbecovirus currently known to infect humans.  If clinically indicated additional testing with an alternate test  methodology [phone]) is advised. The SARS-CoV-2 RNA is generally  detectable in upper and lower respiratory sp ecimens during the acute  phase of infection. The expected result is Negative. Fact Sheet for Patients:  StrictlyIdeas.no Fact Sheet for Healthcare Providers: BankingDealers.co.za This test is not yet approved or cleared by the [place] FDA and has been authorized for detection and/or diagnosis of SARS-CoV-2 by FDA under an Emergency Use Authorization (EUA).  This EUA will remain in effect (meaning this test can be used) for the duration of the COVID-19 declaration under Section 564(b)(1) of the Act, 21 U.S.C. section 360bbb-3(b)(1), unless the authorization is terminated or revoked sooner. Performed at [facility], [address], [place], [place] [address]      Scheduled Meds:  methylPREDNISolone (SOLU-MEDROL) injection  60 mg Intravenous Q12H   Continuous Infusions:  Procedures/Studies: Dg Chest Port 1 View  Result Date: 01/01/2019 CLINICAL DATA:  73 year old male with shortness of breath EXAM: PORTABLE CHEST 1 VIEW COMPARISON:  Chest radiograph dated 12/15/2018 FINDINGS: There is cardiomegaly with vascular congestion. There is shallow inspiration. Small bilateral pleural effusions and bibasilar densities similar to prior radiograph which may represent atelectasis or infiltrate. No pneumothorax. Stable cardiomegaly. No acute osseous pathology. IMPRESSION: 1. Cardiomegaly with vascular congestion. 2. Small bilateral pleural effusions and bibasilar densities similar to prior radiograph. Electronically  Signed   By: Anner Crete M.D.   On: 01/01/2019 23:20   Dg Chest Port 1 View  Result Date: 12/15/2018 CLINICAL DATA:  Fever, weakness EXAM: PORTABLE CHEST 1 VIEW COMPARISON:  08/20/2017 chest radiograph. FINDINGS: Low lung volumes. Stable cardiomediastinal silhouette with moderate cardiomegaly. No pneumothorax. Calcified bilateral pleural plaques and pleural thickening, unchanged. No convincing pleural effusion. Patchy consolidation at both lung bases superimposed on chronic reticular opacities. No overt pulmonary edema. IMPRESSION: 1. Patchy consolidation superimposed on chronic reticular opacities at the lung bases, cannot exclude aspiration or pneumonia. Chest radiograph follow-up advised. 2. 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 3. Moderate cardiomegaly without overt pulmonary edema. Electronically Signed   By: Ilona Sorrel M.D.   On: 12/15/2018 18:08   Dg Knee Complete 4 Views Right  Result Date: 12/15/2018 CLINICAL DATA:  Fall, right knee pain when moving EXAM: RIGHT KNEE - COMPLETE 4+ VIEW COMPARISON:  None. FINDINGS: No fracture or dislocation. Small suprapatellar right knee joint effusion. No suspicious focal osseous lesion. Small superior right patellar enthesophyte. No significant degenerative arthropathy. Posterior vascular calcifications. No radiopaque foreign body. IMPRESSION: Small suprapatellar right knee joint effusion, with no right knee  fracture or dislocation. Electronically Signed   By: Ilona Sorrel M.D.   On: 12/15/2018 18:05    Orson Eva, DO  [facility] Pager [phone]  If 7PM-7AM, please contact night-coverage [email or web address] Password [email or web address] 01/02/2019, 7:32 AM   LOS: 0 days

## 2019-01-02 NOTE — ED Notes (Signed)
Notified pt's son Elta Guadeloupe of pt's impending admission and the room pt would be going to.

## 2019-01-03 ENCOUNTER — Inpatient Hospital Stay (HOSPITAL_COMMUNITY): Payer: Medicare Other

## 2019-01-03 DIAGNOSIS — I5031 Acute diastolic (congestive) heart failure: Secondary | ICD-10-CM

## 2019-01-03 DIAGNOSIS — L89612 Pressure ulcer of right heel, stage 2: Secondary | ICD-10-CM | POA: Insufficient documentation

## 2019-01-03 LAB — OSMOLALITY, URINE: Osmolality, Ur: 744 mOsm/kg (ref 300–900)

## 2019-01-03 LAB — BASIC METABOLIC PANEL
Anion gap: 10 (ref 5–15)
BUN: 45 mg/dL — ABNORMAL HIGH (ref 8–23)
CO2: 35 mmol/L — ABNORMAL HIGH (ref 22–32)
Calcium: 8.6 mg/dL — ABNORMAL LOW (ref 8.9–10.3)
Chloride: 94 mmol/L — ABNORMAL LOW (ref 98–111)
Creatinine, Ser: 1.32 mg/dL — ABNORMAL HIGH (ref 0.61–1.24)
GFR calc Af Amer: >60 mL/min (ref >60)
GFR calc non Af Amer: 53 mL/min — ABNORMAL LOW (ref >60)
Glucose, Bld: 144 mg/dL — ABNORMAL HIGH (ref 70–99)
Potassium: 4.6 mmol/L (ref 3.5–5.1)
Sodium: 139 mmol/L (ref 135–145)

## 2019-01-03 LAB — FOLATE RBC
Folate, Hemolysate: 517 ng/mL
Folate, RBC: 1040 ng/mL (ref >498)
Hematocrit: 49.7 % (ref 37.5–51.0)

## 2019-01-03 LAB — POTASSIUM
Potassium: 4.5 mmol/L (ref 3.5–5.1)
Potassium: 4.7 mmol/L (ref 3.5–5.1)
Potassium: 4.9 mmol/L (ref 3.5–5.1)

## 2019-01-03 LAB — MAGNESIUM: Magnesium: 1.9 mg/dL (ref 1.7–2.4)

## 2019-01-03 LAB — HIV ANTIBODY (ROUTINE TESTING W REFLEX): HIV Screen 4th Generation wRfx: NONREACTIVE

## 2019-01-03 MED ORDER — LORAZEPAM 1 MG PO TABS
1.0000 mg | ORAL_TABLET | Freq: Once | ORAL | Status: AC
  Administered 2019-01-03: 1 mg via ORAL
  Filled 2019-01-03: qty 1

## 2019-01-03 MED ORDER — ALPRAZOLAM 1 MG PO TABS
1.0000 mg | ORAL_TABLET | Freq: Two times a day (BID) | ORAL | Status: DC | PRN
  Administered 2019-01-03 – 2019-01-08 (×6): 1 mg via ORAL
  Filled 2019-01-03 (×6): qty 1

## 2019-01-03 MED ORDER — FUROSEMIDE 10 MG/ML IJ SOLN
40.0000 mg | Freq: Two times a day (BID) | INTRAMUSCULAR | Status: DC
  Administered 2019-01-03 – 2019-01-08 (×10): 40 mg via INTRAVENOUS
  Filled 2019-01-03 (×10): qty 4

## 2019-01-03 MED ORDER — METHYLPREDNISOLONE SODIUM SUCC 125 MG IJ SOLR
60.0000 mg | Freq: Two times a day (BID) | INTRAMUSCULAR | Status: DC
  Administered 2019-01-03 – 2019-01-06 (×6): 60 mg via INTRAVENOUS
  Filled 2019-01-03 (×6): qty 2

## 2019-01-03 NOTE — Progress Notes (Signed)
Initial Nutrition Assessment  DOCUMENTATION CODES:      INTERVENTION:  Heart Healthy diet / CHO modified diet 1.5 liter fluid restriction   Provide diet education    NUTRITION DIAGNOSIS:   Increased nutrient needs related to chronic illness(COPD) as evidenced by estimated needs, per patient/family report(history of lung disease (chronic). recent acute hopitalization for pneumonia).   GOAL:  Patient will meet greater than or equal to 90% of their needs  MONITOR:  PO intake, Labs, Weight trends, Skin, I & O's   REASON FOR ASSESSMENT:   Consult Assessment of nutrition requirement/status  ASSESSMENT: Patient is a 73 yo male who has a history of pulmonary asbestosis, COPD, HTN, HLD, CAD. He presents with altered mental status and shortness of breath. Hospital stay earlier this month related to pneumonia.   Discussed with RN. Patient breathing better and appetite is good. According to patient he is eating 75% of all meals. Able to feed himself. At home he eats mainly convienence foods. Grits for breakfast and snack type foods the remainder of the day. He son lives next door and is a support for him. Home intake concerning for high sodium. Will provide handout and review of Heart Healthy/CHO modified diet.  Weight review shows he weighed 99-100 kg last year in April/May. He is down slightly 5-6 kg (~13 lb) over the past year. Patient BMI remains overweight. He has mild to moderate lower extremity edema.   Medications reviewed and include: Norvasc, Lipitor, Lasix, Solumedrol, Lopressor.   Labs: BUN-45  (H), Cr 1.32 (H) BMP Latest Ref Rng & Units 01/03/2019 01/03/2019 01/03/2019  Glucose 70 - 99 mg/dL - - 144(H)  BUN 8 - 23 mg/dL - - 45(H)  Creatinine 0.61 - 1.24 mg/dL - - 1.32(H)  Sodium 135 - 145 mmol/L - - 139  Potassium 3.5 - 5.1 mmol/L 4.9 4.7 4.6  Chloride 98 - 111 mmol/L - - 94(L)  CO2 22 - 32 mmol/L - - 35(H)  Calcium 8.9 - 10.3 mg/dL - - 8.6(L)     NUTRITION - FOCUSED  PHYSICAL EXAM: Nutrition-Focused physical exam: No fat depletion, mild muscle depletion, and mild -moderate edema.     Diet Order:   Diet Order            Diet heart healthy/carb modified Room service appropriate? Yes; Fluid consistency: Thin; Fluid restriction: 1500 mL Fluid  Diet effective now              EDUCATION NEEDS:  Not appropriate for education at this time Skin:  Skin Assessment: Skin Integrity Issues: Skin Integrity Issues:: Stage I Stage I: coxxyx  Last BM:  unknown  Height:   Ht Readings from Last 1 Encounters:  01/02/19 6' (1.829 m)    Weight:   Wt Readings from Last 1 Encounters:  01/02/19 95.7 kg    Ideal Body Weight:  81 kg  BMI:  Body mass index is 28.61 kg/m.  Estimated Nutritional Needs:   Kcal:  2088-2262  Protein:  113-122  Fluid:  1500 ml daily per MD   Colman Cater MS,RD,CSG,LDN Office: [phone] Pager: [phone]

## 2019-01-03 NOTE — Progress Notes (Addendum)
PROGRESS NOTE  Curtis [name] [id number] DOB: [birth year] DOA: 01/01/2019 PCP: Curtis Sacramento, MD   Brief History:  73 year old male with a history of pulmonary asbestosis, COPD, coronary artery disease, hypertension, hyperlipidemia, SVT status post ablation presenting with altered mental status.  The patient was recently admitted to the hospital from 12/15/2018 through 12/19/2018 for sepsis secondary to pneumonia, COPD exacerbation and acute on chronic respiratory failure.  He was discharged home with home health physical therapy, to finish Augmentin and prednisone taper.  The patient's son states that the patient has had a generalized weakness for about a week with some confusion.  This has worsened over the past 3 days to the point where the patient had not been taking any of his medications.  On the day of admission, the patient was somnolent and difficult to arouse.  EMS was activated, and the patient was noted to be somewhat cyanotic on his lips but arousable at that time.  In the emergency room, the patient was placed on BiPAP.  Chest x-ray showed vascular congestion.  He was started on steroids and intravenous furosemide.  The patient was afebrile hemodynamically stable with ABG initially showing 7.173/111/74 on 35%.  He was placed on BiPAP.  BNP was 885.  Troponin was 20>>> 21.  Lactic acid was 1.0.  Assessment/Plan: Acute on chronic respiratory failure with hypoxia and hypercarbia -Secondary to pulmonary edema and COPD exacerbation -Personally reviewed chest x-ray--bilateral pleural plaques.  Bilateral pleural effusion, right greater than left.  Increased interstitial markings -Weaned off BiPAP>>2L  COPD exacerbation -Continue Pulmicort -Continue IV Solu-Medrol -Continue duo nebs  Acute diastolic CHF -Increased lasix to 40 mg IV bid -provided education to patient regarding diet and fluid intake -Echocardiogram--EF 55-60%, normal RV, indeterminant diastolic  -Daily  weights -Strict I's and O's  Urinary retention -Placed Foley catheter 8/30 -plan for voiding trial 9/1  Hyperkalemia -Patient received temporizing measures -Improved with IV diuresis  Acute kidney injury -Baseline creatinine 0.9-1.2 -Presented with serum creatinine 1.8 -Monitor with diuresis  Coronary artery disease -No chest pain presently -Personally reviewed EKG--sinus rhythm with nonconducted atrial beats, nonspecific ST changes -Troponins not consistent with ACS  Anxiety/depression -Continue Xanax and Elavil -PMP AWARE reviewed--90 day supply of xanax 2 mg bid refilled in August 2020  Essential hypertension -Holding amlodipine and monitor clinically -Continue metoprolol tartrate  [place] -PT eval -[id number] -folic acid -[id number]  Lower extremity edema, recurrent left -Venous duplex rule out DVT--neg  Disposition Plan:   SNF in 1-2 days  Family Communication:  Son updated on phone 8/31  Consultants:  none  Code Status:  DNI  DVT Prophylaxis:  [name] Heparin    Procedures: As Listed in Progress Note Above  Antibiotics: None   Subjective: Pt is breathing better.  Patient denies fevers, chills, headache, chest pain, dyspnea, nausea, vomiting, diarrhea, abdominal pain, dysuria, hematuria, hematochezia, and melena.   Objective: Vitals:   01/03/19 1100 01/03/19 1101 01/03/19 1138 01/03/19 1200  BP: 132/68 132/68  (!) 146/85  Pulse: (!) 57 67  90  Resp: 14 14  14   Temp:   98.1 F (36.7 C)   TempSrc:   Oral   SpO2: 100% 100%  100%  Weight:      Height:        Intake/Output Summary (Last 24 hours) at 01/03/2019 1226 Last data filed at 01/03/2019 0853 Gross per 24 hour  Intake 2220 ml  Output 1450 ml  Net 770  ml   Weight change:  Exam:   General:  Pt is alert, follows commands appropriately, not in acute distress  HEENT: No icterus, No thrush, No neck mass, [place]/AT  Cardiovascular: RRR, S1/S2, no rubs, no  gallops  Respiratory: bilateral scattered rhonchi  Abdomen: Soft/+BS, non tender, non distended, no guarding  Extremities: 1 + LE edema, No lymphangitis, No petechiae, No rashes, no synovitis   Data Reviewed: I have personally reviewed following labs and imaging studies Basic Metabolic Panel: Recent Labs  Lab 01/01/19 2235 01/02/19 0606 01/02/19 [id number] 01/02/19 0957 01/02/19 2033 01/03/19 0036 01/03/19 0425 01/03/19 0823 01/03/19 1155  NA 142 142  --  143  --   --  139  --   --   K 5.8* 4.9  --  4.7 4.6 4.5 4.6 4.7 4.9  CL 100 97*  --  95*  --   --  94*  --   --   CO2 35* 31  --  35*  --   --  35*  --   --   GLUCOSE 100* 163* 161* 132*  --   --  144*  --   --   BUN 46* 45*  --  45*  --   --  45*  --   --   CREATININE 1.88* 1.72*  --  1.52*  --   --  1.32*  --   --   CALCIUM 8.7* 9.0  --  9.0  --   --  8.6*  --   --   MG  --   --   --  2.0  --   --  1.9  --   --    Liver Function Tests: Recent Labs  Lab 01/01/19 2235 01/02/19 0957  AST 28 26  ALT 32 36  ALKPHOS 72 80  BILITOT 0.6 0.9  PROT 6.6 6.8  ALBUMIN 3.1* 3.3*   No results for input(s): LIPASE, AMYLASE in the last 168 hours. No results for input(s): AMMONIA in the last 168 hours. Coagulation Profile: No results for input(s): INR, PROTIME in the last 168 hours. CBC: Recent Labs  Lab 01/01/19 2327 01/02/19 0957  WBC 8.2 5.3  NEUTROABS 6.6  --   HGB 14.2 14.5  HCT 51.1 51.8  MCV 103.4* 101.2*  PLT 216 219   Cardiac Enzymes: No results for input(s): CKTOTAL, CKMB, CKMBINDEX, TROPONINI in the last 168 hours. BNP: Invalid input(s): POCBNP CBG: No results for input(s): GLUCAP in the last 168 hours. HbA1C: No results for input(s): HGBA1C in the last 72 hours. Urine analysis:    Component Value Date/Time   COLORURINE AMBER (A) 01/02/2019 0046   APPEARANCEUR HAZY (A) 01/02/2019 0046   LABSPEC 1.026 01/02/2019 0046   PHURINE 5.0 01/02/2019 0046   GLUCOSEU NEGATIVE 01/02/2019 0046   HGBUR NEGATIVE  01/02/2019 0046   BILIRUBINUR NEGATIVE 01/02/2019 0046   KETONESUR NEGATIVE 01/02/2019 0046   PROTEINUR 100 (A) 01/02/2019 0046   UROBILINOGEN 0.2 02/12/2015 1859   NITRITE NEGATIVE 01/02/2019 0046   LEUKOCYTESUR NEGATIVE 01/02/2019 0046   Sepsis Labs: @LABRCNTIP (procalcitonin:4,lacticidven:4) ) Recent Results (from the past 240 hour(s))  SARS Coronavirus 2 [facility] order, Performed in [facility] hospital lab) Nasopharyngeal Nasopharyngeal Swab     Status: None   Collection Time: 01/01/19 10:21 PM   Specimen: Nasopharyngeal Swab  Result Value Ref Range Status   SARS Coronavirus 2 NEGATIVE NEGATIVE Final    Comment: (NOTE) If result is NEGATIVE SARS-CoV-2 target nucleic acids are NOT DETECTED.  The SARS-CoV-2 RNA is generally detectable in upper and lower  respiratory specimens during the acute phase of infection. The lowest  concentration of SARS-CoV-2 viral copies this assay can detect is 250  copies / mL. A negative result does not preclude SARS-CoV-2 infection  and should not be used as the sole basis for treatment or other  patient management decisions.  A negative result may occur with  improper specimen collection / handling, submission of specimen other  than nasopharyngeal swab, presence of viral mutation(s) within the  areas targeted by this assay, and inadequate number of viral copies  (<250 copies / mL). A negative result must be combined with clinical  observations, patient history, and epidemiological information. If result is POSITIVE SARS-CoV-2 target nucleic acids are DETECTED. The SARS-CoV-2 RNA is generally detectable in upper and lower  respiratory specimens dur ing the acute phase of infection.  Positive  results are indicative of active infection with SARS-CoV-2.  Clinical  correlation with patient history and other diagnostic information is  necessary to determine patient infection status.  Positive results do  not rule out bacterial infection or  co-infection with other viruses. If result is PRESUMPTIVE POSTIVE SARS-CoV-2 nucleic acids MAY BE PRESENT.   A presumptive positive result was obtained on the submitted specimen  and confirmed on repeat testing.  While 2019 novel coronavirus  (SARS-CoV-2) nucleic acids may be present in the submitted sample  additional confirmatory testing may be necessary for epidemiological  and / or clinical management purposes  to differentiate between  SARS-CoV-2 and other Sarbecovirus currently known to infect humans.  If clinically indicated additional testing with an alternate test  methodology [phone]) is advised. The SARS-CoV-2 RNA is generally  detectable in upper and lower respiratory sp ecimens during the acute  phase of infection. The expected result is Negative. Fact Sheet for Patients:  StrictlyIdeas.no Fact Sheet for Healthcare Providers: BankingDealers.co.za This test is not yet approved or cleared by the [place] FDA and has been authorized for detection and/or diagnosis of SARS-CoV-2 by FDA under an Emergency Use Authorization (EUA).  This EUA will remain in effect (meaning this test can be used) for the duration of the COVID-19 declaration under Section 564(b)(1) of the Act, 21 U.S.C. section 360bbb-3(b)(1), unless the authorization is terminated or revoked sooner. Performed at [facility], [address] [place], [place] [address]   Urine culture     Status: Abnormal (Preliminary result)   Collection Time: 01/02/19 12:46 AM   Specimen: Urine, Catheterized  Result Value Ref Range Status   Specimen Description   Final    URINE, CATHETERIZED Performed at [facility], [address] [place], [place] [address]    Special Requests   Final    Normal Performed at [facility], [address] [place], [place] [address]    Culture (A)  Final    >=100,000 COLONIES/mL STAPHYLOCOCCUS SPECIES (COAGULASE NEGATIVE) SUSCEPTIBILITIES  TO FOLLOW Performed at [place] [address] [place], [place] [address]    Report Status PENDING  Incomplete  MRSA PCR Screening     Status: None   Collection Time: 01/02/19  8:35 AM   Specimen: Nasopharyngeal  Result Value Ref Range Status   MRSA by PCR NEGATIVE NEGATIVE Final    Comment:        The GeneXpert MRSA Assay (FDA approved for NASAL specimens only), is one component of a comprehensive MRSA colonization surveillance program. It is not intended to diagnose MRSA infection nor to  guide or monitor treatment for MRSA infections. Performed at [facility], [address], [place], [place] [address]   Culture, sputum-assessment     Status: None   Collection Time: 01/02/19  8:39 AM   Specimen: Sputum  Result Value Ref Range Status   Specimen Description SPUTUM  Final   Special Requests NONE  Final   Sputum evaluation   Final    Sputum specimen not acceptable for testing.  Please recollect.   NOTIFIED NURSE DAVIS AT 1300 ON 01/02/2019 BY EVA Performed at [facility], [address], [place], [place] [address]    Report Status 01/02/2019 FINAL  Final     Scheduled Meds:  amitriptyline  100 mg Oral QHS   amLODipine  2.5 mg Oral Daily   aspirin EC  81 mg Oral Daily   atorvastatin  40 mg Oral q1800   budesonide (PULMICORT) nebulizer solution  0.5 mg Nebulization BID   Chlorhexidine Gluconate Cloth  6 each Topical Daily   finasteride  5 mg Oral q morning - 10a   furosemide  40 mg Intravenous Daily   heparin  5,000 Units Subcutaneous Q8H   ipratropium-albuterol  3 mL Nebulization Q6H   mouth rinse  15 mL Mouth Rinse BID   methylPREDNISolone (SOLU-MEDROL) injection  60 mg Intravenous Q8H   metoprolol tartrate  12.5 mg Oral BID   tamsulosin  0.4 mg Oral QHS   Continuous Infusions:  Procedures/Studies: US Venous Img Lower Bilateral  Result Date: 01/02/2019 CLINICAL DATA:  73 year old male with lower extremity edema EXAM: BILATERAL LOWER  EXTREMITY VENOUS DOPPLER ULTRASOUND TECHNIQUE: Gray-scale sonography with graded compression, as well as color Doppler and duplex ultrasound were performed to evaluate the lower extremity deep venous systems from the level of the common femoral vein and including the common femoral, femoral, profunda femoral, popliteal and calf veins including the posterior tibial, peroneal and gastrocnemius veins when visible. The superficial great saphenous vein was also interrogated. Spectral Doppler was utilized to evaluate flow at rest and with distal augmentation maneuvers in the common femoral, femoral and popliteal veins. COMPARISON:  None. FINDINGS: RIGHT LOWER EXTREMITY Common Femoral Vein: No evidence of thrombus. Normal compressibility, respiratory phasicity and response to augmentation. Increased pulsatility of the venous waveforms. Saphenofemoral Junction: No evidence of thrombus. Normal compressibility and flow on color Doppler imaging. Profunda Femoral Vein: No evidence of thrombus. Normal compressibility and flow on color Doppler imaging. Femoral Vein: No evidence of thrombus. Normal compressibility, respiratory phasicity and response to augmentation. Popliteal Vein: No evidence of thrombus. Normal compressibility, respiratory phasicity and response to augmentation. Calf Veins: No evidence of thrombus. Normal compressibility and flow on color Doppler imaging. Superficial Great Saphenous Vein: No evidence of thrombus. Normal compressibility. Venous Reflux:  None. Other Findings:  None. LEFT LOWER EXTREMITY Common Femoral Vein: No evidence of thrombus. Normal compressibility, respiratory phasicity and response to augmentation. Increased pulsatility of the venous waveforms. Saphenofemoral Junction: No evidence of thrombus. Normal compressibility and flow on color Doppler imaging. Profunda Femoral Vein: No evidence of thrombus. Normal compressibility and flow on color Doppler imaging. Femoral Vein: No evidence of  thrombus. Normal compressibility, respiratory phasicity and response to augmentation. Popliteal Vein: No evidence of thrombus. Normal compressibility, respiratory phasicity and response to augmentation. Calf Veins: No evidence of thrombus. Normal compressibility and flow on color Doppler imaging. Superficial Great Saphenous Vein: No evidence of thrombus. Normal compressibility. Venous Reflux:  None. Other Findings:  None. IMPRESSION: 1. No evidence of deep venous thrombosis in either lower extremity.  2. Increased pulsatility of the venous waveforms suggests elevated right heart pressures. Findings can be seen in tricuspid regurgitation, right heart failure, pulmonary hypertension and COPD. Electronically Signed   By: Jacqulynn Cadet M.D.   On: 01/02/2019 14:30   Portable Chest 1 View  Result Date: 01/03/2019 CLINICAL DATA:  Shortness of breath and altered mental status. Congestive heart failure. EXAM: PORTABLE CHEST 1 VIEW COMPARISON:  01/01/2019 and 08/20/2017 FINDINGS: Stable cardiomegaly and pulmonary vascular congestion. Aortic atherosclerosis. Improved aeration of both lungs is seen. Bilateral pleural-parenchymal scarring remains stable compared to 2019. No evidence of acute infiltrate or pleural effusion. IMPRESSION: Stable cardiomegaly, pulmonary vascular congestion, and bilateral pleural-parenchymal scarring. No acute findings. Electronically Signed   By: Marlaine Hind M.D.   On: 01/03/2019 05:19   Dg Chest Port 1 View  Result Date: 01/01/2019 CLINICAL DATA:  73 year old male with shortness of breath EXAM: PORTABLE CHEST 1 VIEW COMPARISON:  Chest radiograph dated 12/15/2018 FINDINGS: There is cardiomegaly with vascular congestion. There is shallow inspiration. Small bilateral pleural effusions and bibasilar densities similar to prior radiograph which may represent atelectasis or infiltrate. No pneumothorax. Stable cardiomegaly. No acute osseous pathology. IMPRESSION: 1. Cardiomegaly with vascular  congestion. 2. Small bilateral pleural effusions and bibasilar densities similar to prior radiograph. Electronically Signed   By: Anner Crete M.D.   On: 01/01/2019 23:20   Dg Chest Port 1 View  Result Date: 12/15/2018 CLINICAL DATA:  Fever, weakness EXAM: PORTABLE CHEST 1 VIEW COMPARISON:  08/20/2017 chest radiograph. FINDINGS: Low lung volumes. Stable cardiomediastinal silhouette with moderate cardiomegaly. No pneumothorax. Calcified bilateral pleural plaques and pleural thickening, unchanged. No convincing pleural effusion. Patchy consolidation at both lung bases superimposed on chronic reticular opacities. No overt pulmonary edema. IMPRESSION: 1. Patchy consolidation superimposed on chronic reticular opacities at the lung bases, cannot exclude aspiration or pneumonia. Chest radiograph follow-up advised. 2. 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 3. Moderate cardiomegaly without overt pulmonary edema. Electronically Signed   By: Ilona Sorrel M.D.   On: 12/15/2018 18:08   Dg Knee Complete 4 Views Right  Result Date: 12/15/2018 CLINICAL DATA:  Fall, right knee pain when moving EXAM: RIGHT KNEE - COMPLETE 4+ VIEW COMPARISON:  None. FINDINGS: No fracture or dislocation. Small suprapatellar right knee joint effusion. No suspicious focal osseous lesion. Small superior right patellar enthesophyte. No significant degenerative arthropathy. Posterior vascular calcifications. No radiopaque foreign body. IMPRESSION: Small suprapatellar right knee joint effusion, with no right knee fracture or dislocation. Electronically Signed   By: Ilona Sorrel M.D.   On: 12/15/2018 18:05    Orson Eva, DO  [facility] Pager [phone]  If 7PM-7AM, please contact night-coverage [email or web address] Password [email or web address] 01/03/2019, 12:26 PM   LOS: 1 day

## 2019-01-04 LAB — BASIC METABOLIC PANEL
Anion gap: 11 (ref 5–15)
BUN: 45 mg/dL — ABNORMAL HIGH (ref 8–23)
CO2: 36 mmol/L — ABNORMAL HIGH (ref 22–32)
Calcium: 8.5 mg/dL — ABNORMAL LOW (ref 8.9–10.3)
Chloride: 90 mmol/L — ABNORMAL LOW (ref 98–111)
Creatinine, Ser: 1.11 mg/dL (ref 0.61–1.24)
GFR calc Af Amer: >60 mL/min (ref >60)
GFR calc non Af Amer: >60 mL/min (ref >60)
Glucose, Bld: 150 mg/dL — ABNORMAL HIGH (ref 70–99)
Potassium: 4.1 mmol/L (ref 3.5–5.1)
Sodium: 137 mmol/L (ref 135–145)

## 2019-01-04 LAB — URINE CULTURE
Culture: >=100000 — AB
Special Requests: NORMAL

## 2019-01-04 LAB — FOLATE: Folate: 6.2 ng/mL (ref >5.9)

## 2019-01-04 LAB — MAGNESIUM: Magnesium: 1.9 mg/dL (ref 1.7–2.4)

## 2019-01-04 NOTE — Plan of Care (Signed)
  Problem: Acute Rehab PT Goals(only PT should resolve) Goal: Pt Will Go Supine/Side To Sit Outcome: Progressing Flowsheets (Taken 01/04/2019 1205) Pt will go Supine/Side to Sit: with modified independence Goal: Patient Will Transfer Sit To/From Stand Outcome: Progressing Flowsheets (Taken 01/04/2019 1205) Patient will transfer sit to/from stand: with min guard assist Goal: Pt Will Transfer Bed To Chair/Chair To Bed Outcome: Progressing Flowsheets (Taken 01/04/2019 1205) Pt will Transfer Bed to Chair/Chair to Bed: min guard assist Goal: Pt Will Ambulate Outcome: Progressing Flowsheets (Taken 01/04/2019 1205) Pt will Ambulate:  with min guard assist  50 feet  with rolling walker   12:06 PM, 01/04/19 Lonell Grandchild, MPT Physical Therapist with [facility] [phone] office [phone] mobile phone

## 2019-01-04 NOTE — TOC Initial Note (Addendum)
Transition of Care [facility]) - Initial/Assessment Note    Patient Details  Name: Curtis [name] MRN: [id number] Date of Birth: 04/18/1946  Transition of Care [facility]) CM/SW Contact:    Wynn Kernes, Chauncey Reading, RN Phone Number: 01/04/2019, 11:43 AM  Clinical Narrative:    CHF. From home, alone, independent. Spoke with patient.            Introduced self as Tourist information centre manager and explained role in discharge planning.  Verified patient lives alone, son lives next door and checks on him frequently.  Patient recommended for SNF. He was a min assist with PT, but needs help with oxygen/energy conservation in regards to his COPD.   Patient adamant about not going to SNF as he has been before and did not enjoy his experience. He is somewhat agreeable to home health PT. He is agreeable to CM discussing with his son, Elta Guadeloupe.   Call to [facility]- discussed SNF recommendation and how patient did during PT assessment. Elta Guadeloupe is agreeable to patient returning home. He is agreeable to home health, no preference on provider. Discussed ordering PT and RN.   Upon chart review, [facility]. No mention from patient or son on home health being current. Will follow up with [facility] to see if patient is active.   Plan for patient to DC with home health. Son updated that patient will most likely be ready 01/05/19 for discharge per discussion with attending today.   Patient has DME cane, walker,  wheelchair, and oxygen through [facility]. Patient is driven by son  to MD appointments.  Verified patient has PCP and follow up appointment is scheduled.    ADDENDUM: Spoke with [place] [facility], patient was referred but services were never started/case never opened with [facility].   Have referred to [facility]. [name] of [place] notified. Will retrieve orders when available. Will need HH RN and PT.    Expected Discharge Plan: [place] Barriers to Discharge: No Barriers Identified   Patient  Goals and CMS Choice Patient states their goals for this hospitalization and ongoing recovery are:: return home today CMS Medicare.gov Compare Post Acute Care list provided to:: Patient(and son - Curtis [name]) Choice offered to / list presented to : Patient, Adult Children  Expected Discharge Plan and Services Expected Discharge Plan: [place]   Discharge Planning Services: Follow-up appt scheduled Post Acute Care Choice: Home Health Living arrangements for the past 2 months: Single Family Home                           HH Arranged: PT, RN   Date HH Agency Contacted: 01/04/19 Time [place]: [id number]    Prior Living Arrangements/Services Living arrangements for the past 2 months: [place] with:: Self Patient language and need for interpreter reviewed:: Yes Do you feel safe going back to the place where you live?: Yes      Need for Family Participation in Patient Care: Yes (Comment) Care giver support system in place?: Yes (comment) Current home services: DME(RW, oxygen) Criminal Activity/Legal Involvement Pertinent to Current Situation/Hospitalization: No - Comment as needed  Activities of Daily Living Home Assistive Devices/Equipment: Gilford Rile (specify type) ADL Screening (condition at time of admission) Patient's cognitive ability adequate to safely complete daily activities?: Yes Is the patient deaf or have difficulty hearing?: No Does the patient have difficulty seeing, even when wearing glasses/contacts?: No  Does the patient have difficulty concentrating, remembering, or making decisions?: No Patient able to express need for assistance with ADLs?: Yes Does the patient have difficulty dressing or bathing?: No Independently performs ADLs?: Yes (appropriate for developmental age) Does the patient have difficulty walking or climbing stairs?: No Weakness of Legs: None Weakness of Arms/Hands: None  Permission  Sought/Granted Permission sought to share information with : Family Supports, Other (comment) Permission granted to share information with : Yes, Verbal Permission Granted             Orientation: : Oriented to Self, Oriented to Place, Oriented to  Time      Admission diagnosis:  Acute on chronic respiratory failure with hypoxia and hypercapnia (HCC) [J96.21, J96.22] Congestive heart failure, unspecified HF chronicity, unspecified heart failure type ([place]) [I50.9] Patient Active Problem List   Diagnosis Date Noted  . Acute diastolic CHF (congestive heart failure) ([place]) 01/03/2019  . Pressure injury of skin 01/03/2019  . COPD exacerbation ([place]) 01/02/2019  . Acute on chronic respiratory failure with hypoxia and hypercapnia ([place]) 01/02/2019  . Acute CHF (congestive heart failure) ([place]) 01/02/2019  . Hyperkalemia 01/02/2019  . Sepsis ([place]) 12/15/2018  . Carotid artery disease ([place]) 02/27/2016  . Acute hyponatremia 02/12/2015  . Hereditary and idiopathic peripheral neuropathy 01/30/2015  . Acute on chronic respiratory failure ([place]) 05/08/2014  . Anemia, iron deficiency 05/08/2014  . Hyponatremia 05/08/2014  . Urinary retention 05/08/2014  . Chronic respiratory failure with hypoxia ([place]) 04/30/2014  . Shortness of breath   . Aspiration into airway   . HCAP (healthcare-associated pneumonia) 04/24/2014  . Hyperlipidemia LDL goal <70 04/20/2014  . AAA (abdominal aortic aneurysm), stable 04/20/2014  . Abdominal pain, generalized   . Chest pain   . SOB (shortness of breath)   . DOE (dyspnea on exertion), may be Brilinta this was stopped changed to Plavix 04/18/2014  . Peripheral vascular disease ([place])   . Presence of drug coated stent in left circumflex coronary artery: Promus DES 3.5 mm x 38 mm (3.9 mm) 01/17/2014    Class: Acute  . CAD (coronary artery disease), native coronary artery   . SVT (supraventricular tachycardia) s/p ablation 01/16/2014 01/16/2014  . Two small Nonruptured  cerebral aneurysm 09/08/2013  . TIA (transient ischemic attack) 09/06/2013  . HTN (hypertension) 09/05/2013  . AKI (acute kidney injury) ([place]) 11/01/2012  . TOBACCO ABUSE, hx 08/23/2007  . COPD with chronic bronchitis ([place]) 08/12/2007  . 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 Reflex sympathetic dystrophy 05/29/2007  . GERD 05/29/2007   PCP:  [name], MD Pharmacy:   [facility] [address] [place] [address] Phone: [phone] Fax: [phone]  [id number] - [place], [place] - [address] [place] [address] Phone: [phone] Fax: [phone]     Social Determinants of Health (SDOH) Interventions    Readmission Risk Interventions Readmission Risk Prevention Plan 01/04/2019 12/19/2018  Transportation Screening Complete Complete  PCP or Specialist Appt within 5-7 Days - Complete  PCP or Specialist Appt within 3-5 Days Complete -  Home Care Screening - Complete  Medication Review (RN CM) - Complete  HRI or Home Care Consult Complete -  Social Work Consult for [place] Planning/Counseling Complete -  Palliative Care Screening Not Applicable -  Medication Review Press photographer) Complete -  Some recent data might be hidden

## 2019-01-04 NOTE — Evaluation (Signed)
Physical Therapy Evaluation Patient Details Name: Curtis [name] MRN: [id number] DOB: July 10, 1945 Today's Date: 01/04/2019   History of Present Illness  Curtis [name]  is a 73 y.o. male, with history of pulmonary asbestosis, COPD, peripheral vascular disease, coronary artery disease status post MI in 2015, IBS, hypertension, hyperlipidemia, glaucoma, GERD, diverticulosis, history of AAA presents to the ED with son for altered mental status.  Unfortunately son is a longer at bed side at time of my exam.  History is collected from medical record and from ER staff.  Apparently patient had just been discharged from the hospital within the last 2 weeks.  At that time he had community-acquired pneumonia and acute hypoxic and hypercarbic respiratory failure.  He was discharged with instructions to follow-up with his primary care physician in 1 week.  He was continued on 4 L nasal cannula at home with up to 8 L for ambulation.  He did a steroid taper, continued Augmentin, was prescribed Symbicort, and received a rolling walker.  Son reported to the ED staff the patient was at first doing better but not quite to his baseline.  He started complaining of shortness of breath shortly after that discharge.  Then 2 days ago he started having confusion at home.  Son brought father in unfortunately found that he was in hypoxic hypercarbic respiratory failure once again.  He also had a chest x-ray that showed congestion.  His BNP was in the 800s.  Last one on file was in the 70s.  Patient's last echo was in 2015.  So patient is being admitted for acute hypoxic and hypercarbic respiratory failure secondary to COPD exacerbation, and CHF as well.    Clinical Impression  Patient demonstrates labored movement for sitting up at bedside, unsteady on feet requiring use of RW due to fall risk, limited for ambulation due to fatigue and SpO2 desaturation from 90% to 74% while on 4 LPM O2 (see below).  Patient tolerated sitting up in  chair after therapy.  Patient will benefit from continued physical therapy in hospital and recommended venue below to increase strength, balance, endurance for safe ADLs and gait.    Follow Up Recommendations SNF;Supervision for mobility/OOB;Supervision - Intermittent    Equipment Recommendations  None recommended by PT    Recommendations for Other Services       Precautions / Restrictions Precautions Precautions: Fall Restrictions Weight Bearing Restrictions: No      Mobility  Bed Mobility Overal bed mobility: Needs Assistance Bed Mobility: Supine to Sit Rolling: Supervision         General bed mobility comments: increased time, labored movement  Transfers Overall transfer level: Needs assistance Equipment used: Rolling walker (2 wheeled) Transfers: Sit to/from [facility] Sit to Stand: Min assist Stand pivot transfers: Min guard;Min assist       General transfer comment: slow unsteady movement, required use of RW for safety  Ambulation/Gait Ambulation/Gait assistance: Min assist Gait Distance (Feet): 30 Feet Assistive device: Rolling walker (2 wheeled) Gait Pattern/deviations: Decreased step length - right;Decreased step length - left;Decreased stride length Gait velocity: decreased   General Gait Details: slow labored cadence while on 4 LPM O2, had to increase to 6 LPM O2 due to SpO2 dropping from 90% to 74%, SpO2 at 85% on 6 LPM O2 during ambulation back to room and increased to 89% after resting in chair  Stairs            Wheelchair Mobility    Modified Rankin (Stroke Patients Only)  Balance Overall balance assessment: Needs assistance Sitting-balance support: Feet supported;No upper extremity supported Sitting balance-Leahy Scale: Fair Sitting balance - Comments: fair/good seated at bedside   Standing balance support: Bilateral upper extremity supported;During functional activity Standing balance-Leahy Scale:  Fair Standing balance comment: using RW                             Pertinent Vitals/Pain Pain Assessment: No/denies pain    Home Living Family/patient expects to be discharged to:: Private residence Living Arrangements: Alone Available Help at Discharge: Family;Available PRN/intermittently Type of Home: House Home Access: Ramped entrance     Home Layout: One level Home Equipment: Walker - 2 wheels;Cane - single point;Shower seat;Bedside commode      Prior Function Level of Independence: Independent with assistive device(s)         Comments: Household and short distanced community ambulator with SPC/RW PRN, drives, "per patient"     Hand Dominance   Dominant Hand: Right    Extremity/Trunk Assessment   Upper Extremity Assessment Upper Extremity Assessment: Defer to OT evaluation    Lower Extremity Assessment Lower Extremity Assessment: Generalized weakness    Cervical / Trunk Assessment Cervical / Trunk Assessment: Normal  Communication   Communication: No difficulties  Cognition Arousal/Alertness: Awake/alert Behavior During Therapy: WFL for tasks assessed/performed Overall Cognitive Status: Within Functional Limits for tasks assessed                                 General Comments: slow to answer some questions      General Comments      Exercises     Assessment/Plan    PT Assessment Patient needs continued PT services  PT Problem List Decreased strength;Decreased mobility;Decreased activity tolerance;Decreased balance       PT Treatment Interventions Gait training;Stair training;Functional mobility training;Therapeutic activities;Therapeutic exercise;Patient/family education    PT Goals (Current goals can be found in the Care Plan section)  Acute Rehab PT Goals Patient Stated Goal: return home with family to assist PT Goal Formulation: With patient Time For Goal Achievement: 01/18/19 Potential to Achieve Goals:  Good    Frequency Min 3X/week   Barriers to discharge        Co-evaluation PT/OT/SLP Co-Evaluation/Treatment: Yes Reason for Co-Treatment: For patient/therapist safety;To address functional/ADL transfers PT goals addressed during session: Mobility/safety with mobility;Balance;Proper use of DME         AM-PAC PT "6 Clicks" Mobility  Outcome Measure Help needed turning from your back to your side while in a flat bed without using bedrails?: None Help needed moving from lying on your back to sitting on the side of a flat bed without using bedrails?: None Help needed moving to and from a bed to a chair (including a wheelchair)?: A Little Help needed standing up from a chair using your arms (e.g., wheelchair or bedside chair)?: A Little Help needed to walk in hospital room?: A Lot Help needed climbing 3-5 steps with a railing? : A Lot 6 Click Score: 18    End of Session Equipment Utilized During Treatment: Gait belt;Oxygen Activity Tolerance: Patient tolerated treatment well;Patient limited by fatigue;Other (comment)(Patient limited due to SpO2 desaturation) Patient left: in chair;with call bell/phone within reach;with chair alarm set Nurse Communication: Mobility status PT Visit Diagnosis: Unsteadiness on feet (R26.81);Other abnormalities of gait and mobility (R26.89);Muscle weakness (generalized) (M62.81)    Time: [id number] PT Time  Calculation (min) (ACUTE ONLY): 32 min   Charges:   PT Evaluation $PT Eval Moderate Complexity: 1 Mod PT Treatments $Therapeutic Activity: 23-37 mins        12:04 PM, 01/04/19 Lonell Grandchild, MPT Physical Therapist with [facility] [phone] office [phone] mobile phone

## 2019-01-04 NOTE — Progress Notes (Signed)
PROGRESS NOTE  Curtis [name] [id number] DOB: May 11, 1945 DOA: 01/01/2019 PCP: [name], MD  Brief History: 73 year old male with a history of pulmonary asbestosis, COPD, coronary artery disease, hypertension, hyperlipidemia, SVT status post ablation presenting with altered mental status. The patient was recently admitted to the hospital from 12/15/2018 through 12/19/2018 for sepsis secondary to pneumonia, COPD exacerbation and acute on chronic respiratory failure. He was discharged home with home health physical therapy,to finish Augmentin and prednisone taper. The patient's son states that the patient has had a generalized weakness for about a week with some confusion. This has worsened over the past 3 days to the point where the patient had not been taking any of his medications. On the day of admission, the patient was somnolent and difficult to arouse. EMS was activated, and the patient was noted to be somewhat cyanotic on his lips but arousable at that time. In the emergency room, the patient was placed on BiPAP. Chest x-ray showed vascular congestion. He was started on steroids and intravenous furosemide. The patient was afebrile hemodynamically stable with ABG initially showing 7.173/111/74 on 35%. He was placed on BiPAP. BNP was 885. Troponin was 20>>>21. Lactic acid was 1.0.  Assessment/Plan: Acute on chronic respiratory failure with hypoxia and hypercarbia -Secondary to pulmonary edema and COPD exacerbation -Personally reviewed chest x-ray--bilateral pleural plaques. Bilateral pleural effusion, right greater than left. Increased interstitial markings -Weaned off BiPAP>>2L  COPD exacerbation -Continue Pulmicort -Continue IV Solu-Medrol -Continue duo nebs  Acute diastolic CHF -Continue lasix to 40 mg IV bid -provided education to patient regarding diet and fluid intake -Echocardiogram--EF 55-60%, normal RV, indeterminant diastolic  -Daily  weights -Strict I's and O's--5L out last 48 hours  Dysrhythmia/SVT/Aflutter -personally reviewed EKG--Aflutter -consult cardiology in setting of prior SVT with ablation and CHF -continue metoprolol -defer [facility] pending cardiology input  Urinary retention -Placed Foley catheter 8/30 -voiding trial 9/1  Hyperkalemia -Patient received temporizing measures -Improved with IV diuresis  Acute kidney injury -Baseline creatinine 0.9-1.2 -Presented with serum creatinine 1.8 -Monitor with diuresis  Coronary artery disease -No chest pain presently -Personally reviewed EKG--sinus rhythm with nonconducted atrial beats, nonspecific ST changes -Troponins not consistent with ACS  Anxiety/depression -Continue Xanax and Elavil -PMP AWARE reviewed--90 day supply of xanax 2 mg bid refilled in August 2020  Essential hypertension -Holding amlodipine and monitor clinically -Continue metoprolol tartrate  [place] -PT eval -[id number] -folic acid -[id number]  Lower extremity edema, recurrent left -Venous duplex ruleoutDVT--neg  Left Heel Pressure Injury -off load pressure -local wound care  Disposition Plan:Homein 1-2days  Family Communication:Son updated on phone 8/31  Consultants:cardiology  Code Status:DNI  DVT Prophylaxis: [place] Heparin    Procedures: As Listed in Progress Note Above  Antibiotics: None       Subjective: Pt is breathing better, but complains of some dyspnea on exertion.  Denies cp, n/v/d, abd pain, headache.  Denies f/c,.  Objective: Vitals:   01/04/19 0824 01/04/19 0832 01/04/19 0915 01/04/19 1330  BP:   (!) 114/54   Pulse:   96   Resp:      Temp:      TempSrc:      SpO2: 92% 94%  95%  Weight:      Height:        Intake/Output Summary (Last 24 hours) at 01/04/2019 1401 Last data filed at 01/04/2019 0700 Gross per 24 hour  Intake --  Output 2500 ml  Net -2500 ml  Weight change:  Exam:   General:  Pt is  alert, follows commands appropriately, not in acute distress  HEENT: No icterus, No thrush, No neck mass, [place]/AT  Cardiovascular: RRR, S1/S2, no rubs, no gallops  Respiratory: bilateral rhonchi  Abdomen: Soft/+BS, non tender, non distended, no guarding  Extremities: 2 + LE edema, No lymphangitis, No petechiae, No rashes, no synovitis   Data Reviewed: I have personally reviewed following labs and imaging studies Basic Metabolic Panel: Recent Labs  Lab 01/01/19 2235 01/02/19 0606 01/02/19 [id number] 01/02/19 0957  01/03/19 0036 01/03/19 0425 01/03/19 0823 01/03/19 1155 01/04/19 0525  NA 142 142  --  143  --   --  139  --   --  137  K 5.8* 4.9  --  4.7   < > 4.5 4.6 4.7 4.9 4.1  CL 100 97*  --  95*  --   --  94*  --   --  90*  CO2 35* 31  --  35*  --   --  35*  --   --  36*  GLUCOSE 100* 163* 161* 132*  --   --  144*  --   --  150*  BUN 46* 45*  --  45*  --   --  45*  --   --  45*  CREATININE 1.88* 1.72*  --  1.52*  --   --  1.32*  --   --  1.11  CALCIUM 8.7* 9.0  --  9.0  --   --  8.6*  --   --  8.5*  MG  --   --   --  2.0  --   --  1.9  --   --  1.9   < > = values in this interval not displayed.   Liver Function Tests: Recent Labs  Lab 01/01/19 2235 01/02/19 0957  AST 28 26  ALT 32 36  ALKPHOS 72 80  BILITOT 0.6 0.9  PROT 6.6 6.8  ALBUMIN 3.1* 3.3*   No results for input(s): LIPASE, AMYLASE in the last 168 hours. No results for input(s): AMMONIA in the last 168 hours. Coagulation Profile: No results for input(s): INR, PROTIME in the last 168 hours. CBC: Recent Labs  Lab 01/01/19 2327 01/02/19 0606 01/02/19 0957  WBC 8.2  --  5.3  NEUTROABS 6.6  --   --   HGB 14.2  --  14.5  HCT 51.1 49.7 51.8  MCV 103.4*  --  101.2*  PLT 216  --  219   Cardiac Enzymes: No results for input(s): CKTOTAL, CKMB, CKMBINDEX, TROPONINI in the last 168 hours. BNP: Invalid input(s): POCBNP CBG: No results for input(s): GLUCAP in the last 168 hours. HbA1C: No results for  input(s): HGBA1C in the last 72 hours. Urine analysis:    Component Value Date/Time   COLORURINE AMBER (A) 01/02/2019 0046   APPEARANCEUR HAZY (A) 01/02/2019 0046   LABSPEC 1.026 01/02/2019 0046   PHURINE 5.0 01/02/2019 0046   GLUCOSEU NEGATIVE 01/02/2019 0046   HGBUR NEGATIVE 01/02/2019 0046   BILIRUBINUR NEGATIVE 01/02/2019 0046   KETONESUR NEGATIVE 01/02/2019 0046   PROTEINUR 100 (A) 01/02/2019 0046   UROBILINOGEN 0.2 02/12/2015 1859   NITRITE NEGATIVE 01/02/2019 0046   LEUKOCYTESUR NEGATIVE 01/02/2019 0046   Sepsis Labs: @LABRCNTIP (procalcitonin:4,lacticidven:4) ) Recent Results (from the past 240 hour(s))  SARS Coronavirus 2 [facility] order, Performed in [facility] hospital lab) Nasopharyngeal Nasopharyngeal Swab     Status: None   Collection Time:  01/01/19 10:21 PM   Specimen: Nasopharyngeal Swab  Result Value Ref Range Status   SARS Coronavirus 2 NEGATIVE NEGATIVE Final    Comment: (NOTE) If result is NEGATIVE SARS-CoV-2 target nucleic acids are NOT DETECTED. The SARS-CoV-2 RNA is generally detectable in upper and lower  respiratory specimens during the acute phase of infection. The lowest  concentration of SARS-CoV-2 viral copies this assay can detect is 250  copies / mL. A negative result does not preclude SARS-CoV-2 infection  and should not be used as the sole basis for treatment or other  patient management decisions.  A negative result may occur with  improper specimen collection / handling, submission of specimen other  than nasopharyngeal swab, presence of viral mutation(s) within the  areas targeted by this assay, and inadequate number of viral copies  (<250 copies / mL). A negative result must be combined with clinical  observations, patient history, and epidemiological information. If result is POSITIVE SARS-CoV-2 target nucleic acids are DETECTED. The SARS-CoV-2 RNA is generally detectable in upper and lower  respiratory specimens dur ing the acute  phase of infection.  Positive  results are indicative of active infection with SARS-CoV-2.  Clinical  correlation with patient history and other diagnostic information is  necessary to determine patient infection status.  Positive results do  not rule out bacterial infection or co-infection with other viruses. If result is PRESUMPTIVE POSTIVE SARS-CoV-2 nucleic acids MAY BE PRESENT.   A presumptive positive result was obtained on the submitted specimen  and confirmed on repeat testing.  While 2019 novel coronavirus  (SARS-CoV-2) nucleic acids may be present in the submitted sample  additional confirmatory testing may be necessary for epidemiological  and / or clinical management purposes  to differentiate between  SARS-CoV-2 and other Sarbecovirus currently known to infect humans.  If clinically indicated additional testing with an alternate test  methodology [phone]) is advised. The SARS-CoV-2 RNA is generally  detectable in upper and lower respiratory sp ecimens during the acute  phase of infection. The expected result is Negative. Fact Sheet for Patients:  StrictlyIdeas.no Fact Sheet for Healthcare Providers: BankingDealers.co.za This test is not yet approved or cleared by the [place] FDA and has been authorized for detection and/or diagnosis of SARS-CoV-2 by FDA under an Emergency Use Authorization (EUA).  This EUA will remain in effect (meaning this test can be used) for the duration of the COVID-19 declaration under Section 564(b)(1) of the Act, 21 U.S.C. section 360bbb-3(b)(1), unless the authorization is terminated or revoked sooner. Performed at [facility], [address], [place], [place] [address]   Urine culture     Status: Abnormal   Collection Time: 01/02/19 12:46 AM   Specimen: Urine, Catheterized  Result Value Ref Range Status   Specimen Description   Final    URINE, CATHETERIZED Performed at [facility], [address] [place], [place] [address]    Special Requests   Final    Normal Performed at [facility], [address], [place], [place] [address]    Culture (A)  Final    >=100,000 COLONIES/mL STAPHYLOCOCCUS SPECIES (COAGULASE NEGATIVE)   Report Status 01/04/2019 FINAL  Final   Organism ID, Bacteria STAPHYLOCOCCUS SPECIES (COAGULASE NEGATIVE) (A)  Final      Susceptibility   Staphylococcus species (coagulase negative) - MIC*    CIPROFLOXACIN >=8 RESISTANT Resistant     GENTAMICIN <=0.5 SENSITIVE Sensitive     NITROFURANTOIN <=16 SENSITIVE Sensitive     OXACILLIN >=4 RESISTANT Resistant  TETRACYCLINE 2 SENSITIVE Sensitive     VANCOMYCIN 2 SENSITIVE Sensitive     TRIMETH/SULFA <=10 SENSITIVE Sensitive     CLINDAMYCIN <=0.25 SENSITIVE Sensitive     RIFAMPIN <=0.5 SENSITIVE Sensitive     Inducible Clindamycin NEGATIVE Sensitive     * >=100,000 COLONIES/mL STAPHYLOCOCCUS SPECIES (COAGULASE NEGATIVE)  MRSA PCR Screening     Status: None   Collection Time: 01/02/19  8:35 AM   Specimen: Nasopharyngeal  Result Value Ref Range Status   MRSA by PCR NEGATIVE NEGATIVE Final    Comment:        The GeneXpert MRSA Assay (FDA approved for NASAL specimens only), is one component of a comprehensive MRSA colonization surveillance program. It is not intended to diagnose MRSA infection nor to guide or monitor treatment for MRSA infections. Performed at [facility], [address], [place], [place] [address]   Culture, sputum-assessment     Status: None   Collection Time: 01/02/19  8:39 AM   Specimen: Sputum  Result Value Ref Range Status   Specimen Description SPUTUM  Final   Special Requests NONE  Final   Sputum evaluation   Final    Sputum specimen not acceptable for testing.  Please recollect.   NOTIFIED NURSE DAVIS AT 1300 ON 01/02/2019 BY EVA Performed at [facility], [address], [place], [place] [address]    Report Status 01/02/2019 FINAL  Final     Scheduled  Meds:  amitriptyline  100 mg Oral QHS   amLODipine  2.5 mg Oral Daily   aspirin EC  81 mg Oral Daily   atorvastatin  40 mg Oral q1800   budesonide (PULMICORT) nebulizer solution  0.5 mg Nebulization BID   Chlorhexidine Gluconate Cloth  6 each Topical Daily   finasteride  5 mg Oral q morning - 10a   furosemide  40 mg Intravenous BID   heparin  5,000 Units Subcutaneous Q8H   ipratropium-albuterol  3 mL Nebulization Q6H   mouth rinse  15 mL Mouth Rinse BID   methylPREDNISolone (SOLU-MEDROL) injection  60 mg Intravenous Q12H   metoprolol tartrate  12.5 mg Oral BID   tamsulosin  0.4 mg Oral QHS   Continuous Infusions:  Procedures/Studies: US Venous Img Lower Bilateral  Result Date: 01/02/2019 CLINICAL DATA:  73 year old male with lower extremity edema EXAM: BILATERAL LOWER EXTREMITY VENOUS DOPPLER ULTRASOUND TECHNIQUE: Gray-scale sonography with graded compression, as well as color Doppler and duplex ultrasound were performed to evaluate the lower extremity deep venous systems from the level of the common femoral vein and including the common femoral, femoral, profunda femoral, popliteal and calf veins including the posterior tibial, peroneal and gastrocnemius veins when visible. The superficial great saphenous vein was also interrogated. Spectral Doppler was utilized to evaluate flow at rest and with distal augmentation maneuvers in the common femoral, femoral and popliteal veins. COMPARISON:  None. FINDINGS: RIGHT LOWER EXTREMITY Common Femoral Vein: No evidence of thrombus. Normal compressibility, respiratory phasicity and response to augmentation. Increased pulsatility of the venous waveforms. Saphenofemoral Junction: No evidence of thrombus. Normal compressibility and flow on color Doppler imaging. Profunda Femoral Vein: No evidence of thrombus. Normal compressibility and flow on color Doppler imaging. Femoral Vein: No evidence of thrombus. Normal compressibility, respiratory  phasicity and response to augmentation. Popliteal Vein: No evidence of thrombus. Normal compressibility, respiratory phasicity and response to augmentation. Calf Veins: No evidence of thrombus. Normal compressibility and flow on color Doppler imaging. Superficial Great Saphenous Vein: No evidence of thrombus. Normal compressibility. Venous  Reflux:  None. Other Findings:  None. LEFT LOWER EXTREMITY Common Femoral Vein: No evidence of thrombus. Normal compressibility, respiratory phasicity and response to augmentation. Increased pulsatility of the venous waveforms. Saphenofemoral Junction: No evidence of thrombus. Normal compressibility and flow on color Doppler imaging. Profunda Femoral Vein: No evidence of thrombus. Normal compressibility and flow on color Doppler imaging. Femoral Vein: No evidence of thrombus. Normal compressibility, respiratory phasicity and response to augmentation. Popliteal Vein: No evidence of thrombus. Normal compressibility, respiratory phasicity and response to augmentation. Calf Veins: No evidence of thrombus. Normal compressibility and flow on color Doppler imaging. Superficial Great Saphenous Vein: No evidence of thrombus. Normal compressibility. Venous Reflux:  None. Other Findings:  None. IMPRESSION: 1. No evidence of deep venous thrombosis in either lower extremity. 2. Increased pulsatility of the venous waveforms suggests elevated right heart pressures. Findings can be seen in tricuspid regurgitation, right heart failure, pulmonary hypertension and COPD. Electronically Signed   By: Jacqulynn Cadet M.D.   On: 01/02/2019 14:30   Portable Chest 1 View  Result Date: 01/03/2019 CLINICAL DATA:  Shortness of breath and altered mental status. Congestive heart failure. EXAM: PORTABLE CHEST 1 VIEW COMPARISON:  01/01/2019 and 08/20/2017 FINDINGS: Stable cardiomegaly and pulmonary vascular congestion. Aortic atherosclerosis. Improved aeration of both lungs is seen. Bilateral  pleural-parenchymal scarring remains stable compared to 2019. No evidence of acute infiltrate or pleural effusion. IMPRESSION: Stable cardiomegaly, pulmonary vascular congestion, and bilateral pleural-parenchymal scarring. No acute findings. Electronically Signed   By: Marlaine Hind M.D.   On: 01/03/2019 05:19   Dg Chest Port 1 View  Result Date: 01/01/2019 CLINICAL DATA:  73 year old male with shortness of breath EXAM: PORTABLE CHEST 1 VIEW COMPARISON:  Chest radiograph dated 12/15/2018 FINDINGS: There is cardiomegaly with vascular congestion. There is shallow inspiration. Small bilateral pleural effusions and bibasilar densities similar to prior radiograph which may represent atelectasis or infiltrate. No pneumothorax. Stable cardiomegaly. No acute osseous pathology. IMPRESSION: 1. Cardiomegaly with vascular congestion. 2. Small bilateral pleural effusions and bibasilar densities similar to prior radiograph. Electronically Signed   By: Anner Crete M.D.   On: 01/01/2019 23:20   Dg Chest Port 1 View  Result Date: 12/15/2018 CLINICAL DATA:  Fever, weakness EXAM: PORTABLE CHEST 1 VIEW COMPARISON:  08/20/2017 chest radiograph. FINDINGS: Low lung volumes. Stable cardiomediastinal silhouette with moderate cardiomegaly. No pneumothorax. Calcified bilateral pleural plaques and pleural thickening, unchanged. No convincing pleural effusion. Patchy consolidation at both lung bases superimposed on chronic reticular opacities. No overt pulmonary edema. IMPRESSION: 1. Patchy consolidation superimposed on chronic reticular opacities at the lung bases, cannot exclude aspiration or pneumonia. Chest radiograph follow-up advised. 2. 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 3. Moderate cardiomegaly without overt pulmonary edema. Electronically Signed   By: Ilona Sorrel M.D.   On: 12/15/2018 18:08   Dg Knee Complete 4 Views Right  Result Date: 12/15/2018 CLINICAL  DATA:  Fall, right knee pain when moving EXAM: RIGHT KNEE - COMPLETE 4+ VIEW COMPARISON:  None. FINDINGS: No fracture or dislocation. Small suprapatellar right knee joint effusion. No suspicious focal osseous lesion. Small superior right patellar enthesophyte. No significant degenerative arthropathy. Posterior vascular calcifications. No radiopaque foreign body. IMPRESSION: Small suprapatellar right knee joint effusion, with no right knee fracture or dislocation. Electronically Signed   By: Ilona Sorrel M.D.   On: 12/15/2018 18:05    Orson Eva, DO  [facility] Pager [phone]  If 7PM-7AM, please contact night-coverage [email or web address] Password [email or web address] 01/04/2019, 2:01 PM  LOS: 2 days

## 2019-01-04 NOTE — Evaluation (Signed)
Occupational Therapy Evaluation Patient Details Name: Curtis [name] MRN: [id number] DOB: March 01, 1946 Today's Date: 01/04/2019    History of Present Illness Curtis [name]  is a 73 y.o. male, with history of pulmonary asbestosis, COPD, peripheral vascular disease, coronary artery disease status post MI in 2015, IBS, hypertension, hyperlipidemia, glaucoma, GERD, diverticulosis, history of AAA presents to the ED with son for altered mental status.  Unfortunately son is a longer at bed side at time of my exam.  History is collected from medical record and from ER staff.  Apparently patient had just been discharged from the hospital within the last 2 weeks.  At that time he had community-acquired pneumonia and acute hypoxic and hypercarbic respiratory failure.  He was discharged with instructions to follow-up with his primary care physician in 1 week.  He was continued on 4 L nasal cannula at home with up to 8 L for ambulation.  He did a steroid taper, continued Augmentin, was prescribed Symbicort, and received a rolling walker.  Son reported to the ED staff the patient was at first doing better but not quite to his baseline.  He started complaining of shortness of breath shortly after that discharge.  Then 2 days ago he started having confusion at home.  Son brought father in unfortunately found that he was in hypoxic hypercarbic respiratory failure once again.  He also had a chest x-ray that showed congestion.  His BNP was in the 800s.  Last one on file was in the 70s.  Patient's last echo was in 2015.  So patient is being admitted for acute hypoxic and hypercarbic respiratory failure secondary to COPD exacerbation, and CHF as well.   Clinical Impression   Pt agreeable to OT/PT co-evaluation. PTA pt was independent in ADLs, called son to assist on occasion. During evaluation pt with fluctuating O2 sats, decreasing to 74% during functional mobility. Pt is able to complete ADL tasks in sitting, is unable to  tolerate standing at sink or for prolonged periods of time due to weakness, fatigue, and SOB. Recommend SNF on discharge to improve safety and independence in ADL completion, as well as improve strength required for functional tasks.     Follow Up Recommendations  SNF    Equipment Recommendations  None recommended by OT       Precautions / Restrictions Precautions Precautions: Fall Restrictions Weight Bearing Restrictions: No      Mobility Bed Mobility Overal bed mobility: Needs Assistance Bed Mobility: Supine to Sit Rolling: Supervision         General bed mobility comments: Defer to PT note  Transfers Overall transfer level: Needs assistance Equipment used: Rolling walker (2 wheeled) Transfers: Sit to/from [facility] Sit to Stand: Min assist Stand pivot transfers: Min guard;Min assist       General transfer comment: Defer to PT note        ADL either performed or assessed with clinical judgement   ADL Overall ADL's : Needs assistance/impaired Eating/Feeding: Modified independent;Sitting Eating/Feeding Details (indicate cue type and reason): Pt able to open packages and set up tray Grooming: Set up;Sitting Grooming Details (indicate cue type and reason): Unable to complete in standing due to weakness and fatigue                 Toilet Transfer: Min guard;Minimal assistance;Stand-pivot;RW Toilet Transfer Details (indicate cue type and reason): simulated with chair transfer         Functional mobility during ADLs: Min guard;Minimal assistance;Rolling walker General ADL  Comments: Pt requiring increased assistance for ADLs due to weakness and SOB limiting activity tolerance     Vision Baseline Vision/History: No visual deficits Patient Visual Report: No change from baseline Vision Assessment?: No apparent visual deficits            Pertinent Vitals/Pain Pain Assessment: No/denies pain     Hand Dominance Right    Extremity/Trunk Assessment Upper Extremity Assessment Upper Extremity Assessment: Generalized weakness   Lower Extremity Assessment Lower Extremity Assessment: Defer to PT evaluation   Cervical / Trunk Assessment Cervical / Trunk Assessment: Normal   Communication Communication Communication: No difficulties   Cognition Arousal/Alertness: Awake/alert Behavior During Therapy: WFL for tasks assessed/performed Overall Cognitive Status: Within Functional Limits for tasks assessed                                 General Comments: slow to answer some questions              Home Living Family/patient expects to be discharged to:: Private residence Living Arrangements: Alone Available Help at Discharge: Family;Available PRN/intermittently Type of Home: House Home Access: Ramped entrance Entrance Stairs-Number of Steps: 3 Entrance Stairs-Rails: Can reach both;Left;Right Home Layout: One level     Bathroom Shower/Tub: Walk-in shower;Door   [employer] Toilet: Standard Bathroom Accessibility: Yes   Home Equipment: Environmental consultant - 2 wheels;Cane - single point;Shower seat;Bedside commode          Prior Functioning/Environment Level of Independence: Independent with assistive device(s)        Comments: Household and short distanced community ambulator with SPC/RW PRN, drives, "per patient"; son is available to assist with ADLs if pt calls him and requests it        OT Problem List: Decreased strength;Decreased activity tolerance;Impaired balance (sitting and/or standing);Decreased safety awareness;Decreased knowledge of use of DME or AE;Cardiopulmonary status limiting activity      OT Treatment/Interventions:      OT Goals(Current goals can be found in the care plan section) Acute Rehab OT Goals Patient Stated Goal: return home with family to assist  OT Frequency:     Barriers to D/C:            Co-evaluation PT/OT/SLP Co-Evaluation/Treatment: Yes Reason  for Co-Treatment: Complexity of the patient's impairments (multi-system involvement);For patient/therapist safety;To address functional/ADL transfers PT goals addressed during session: Mobility/safety with mobility;Balance;Proper use of DME OT goals addressed during session: ADL's and self-care;Proper use of Adaptive equipment and DME         End of Session Equipment Utilized During Treatment: Gait belt;Rolling walker;Oxygen  Activity Tolerance: Patient limited by fatigue Patient left: in chair;with call bell/phone within reach;with chair alarm set  OT Visit Diagnosis: Muscle weakness (generalized) (M62.81)                Time: [id number] OT Time Calculation (min): 18 min Charges:  OT General Charges $OT Visit: 1 Visit OT Evaluation $OT Eval Low Complexity: [place], OTR/L  [phone] 01/04/2019, 1:48 PM

## 2019-01-04 NOTE — Care Management (Signed)
[place] [phone]   [place] to my Favorites  Quality of Patient Care Rating4 out of 5 stars Patient Survey Summary Rating4 out of [place] [phone]   [place] to my Favorites  Quality of Patient Care Rating3 out of 5 stars Patient Survey Summary Rating5 out of [place] [phone]   [place] to my Favorites  Quality of Patient Care Rating3 out of 5 stars Patient Survey Summary Rating4 out of [place] [phone]   [place] to my [place]  out of 5 stars Patient Survey Summary Rating4 out of [place] [phone]   [place] to my [place]  out of 5 stars Patient Survey Summary Rating3 out of [place] [phone]   [place] to my Favorites  Quality of Patient Care Rating4 out of 5 stars Patient Survey Summary Rating4 out of [place] [phone]   Add [facility], [place] to my Favorites  Quality of Patient Care Rating4 out of 5 stars Patient Survey Summary Rating4 out of [place] [phone]   [place] to my Favorites  Quality of Patient Care Rating4 out of 5 stars Patient Survey Summary Rating4 out of [place] AGE [phone]   [place] AGE to my Favorites  Quality of Patient Care Rating3 out of 5 stars Patient Survey Summary Rating3 out of 5 stars  [facility] [place] [phone]   Add [facility] [place] to my Favorites  Quality of Patient Care Rating3  out of 5 stars Patient Survey Summary Rating4 out of [place] [phone]    [place] to my Favorites  Quality of Patient Care Rating3 out of 5 stars Patient Survey Summary Rating4 out of 5 stars  [facility] [phone]   Add [facility] to my Favorites  Quality of Patient Care Rating3  out of 5 stars Patient Survey Summary Rating3 out of [place] [phone]   Add [place] to my Favorites  Quality of Patient Care Rating3  out of 5 stars Not [place] (336) [place] to my Favorites  Quality of Patient [place]  out of 5 stars

## 2019-01-05 ENCOUNTER — Encounter (HOSPITAL_COMMUNITY): Payer: Self-pay | Admitting: Student

## 2019-01-05 DIAGNOSIS — E785 Hyperlipidemia, unspecified: Secondary | ICD-10-CM

## 2019-01-05 DIAGNOSIS — Z8679 Personal history of other diseases of the circulatory system: Secondary | ICD-10-CM

## 2019-01-05 DIAGNOSIS — J9622 Acute and chronic respiratory failure with hypercapnia: Secondary | ICD-10-CM

## 2019-01-05 DIAGNOSIS — N179 Acute kidney failure, unspecified: Secondary | ICD-10-CM

## 2019-01-05 DIAGNOSIS — Z9889 Other specified postprocedural states: Secondary | ICD-10-CM

## 2019-01-05 DIAGNOSIS — I25118 Atherosclerotic heart disease of native coronary artery with other forms of angina pectoris: Secondary | ICD-10-CM

## 2019-01-05 DIAGNOSIS — I4892 Unspecified atrial flutter: Secondary | ICD-10-CM

## 2019-01-05 DIAGNOSIS — I5031 Acute diastolic (congestive) heart failure: Secondary | ICD-10-CM

## 2019-01-05 DIAGNOSIS — L89629 Pressure ulcer of left heel, unspecified stage: Secondary | ICD-10-CM

## 2019-01-05 DIAGNOSIS — J9621 Acute and chronic respiratory failure with hypoxia: Secondary | ICD-10-CM

## 2019-01-05 DIAGNOSIS — J441 Chronic obstructive pulmonary disease with (acute) exacerbation: Principal | ICD-10-CM

## 2019-01-05 LAB — BASIC METABOLIC PANEL
Anion gap: 5 (ref 5–15)
BUN: 37 mg/dL — ABNORMAL HIGH (ref 8–23)
CO2: 36 mmol/L — ABNORMAL HIGH (ref 22–32)
Calcium: 8.5 mg/dL — ABNORMAL LOW (ref 8.9–10.3)
Chloride: 93 mmol/L — ABNORMAL LOW (ref 98–111)
Creatinine, Ser: 0.98 mg/dL (ref 0.61–1.24)
GFR calc Af Amer: >60 mL/min (ref >60)
GFR calc non Af Amer: >60 mL/min (ref >60)
Glucose, Bld: 129 mg/dL — ABNORMAL HIGH (ref 70–99)
Potassium: 4.1 mmol/L (ref 3.5–5.1)
Sodium: 134 mmol/L — ABNORMAL LOW (ref 135–145)

## 2019-01-05 LAB — MAGNESIUM: Magnesium: 1.9 mg/dL (ref 1.7–2.4)

## 2019-01-05 MED ORDER — METOLAZONE 5 MG PO TABS
2.5000 mg | ORAL_TABLET | Freq: Every day | ORAL | Status: AC
  Administered 2019-01-05 – 2019-01-06 (×2): 2.5 mg via ORAL
  Filled 2019-01-05 (×2): qty 1

## 2019-01-05 NOTE — Care Management Important Message (Signed)
Important Message  Patient Details  Name: Curtis [name] MRN: [id number] Date of Birth: 1945-05-26   Medicare Important Message Given:  Yes     Tommy Medal 01/05/2019, 3:08 PM

## 2019-01-05 NOTE — Consult Note (Addendum)
Cardiology Consult    Patient ID: BENY BURAS; [id number]; 1946-01-25   Admit date: 01/01/2019 Date of Consult: 01/05/2019  Primary Care Provider: [name], MD Primary Cardiologist: Previously Dr. Aundra Dubin but no follow-up since 2015 (evaluated by Richardson Dopp in 2017 but no outpatient follow-up since).   Patient Profile    Curtis [name] is a 73 y.o. 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 Carles Collet.   History of Present Illness    Mr. Grelle was last examined by cardiology in 2017 and was overall doing well from a cardiac perspective at that time.  Was continued on ASA, amlodipine, and statin therapy.  He was most recently admitted to [facility] from 8/12 - 12/19/2018 for acute on chronic hypoxic respiratory failure in the setting of sepsis secondary to CAP and underlying COPD and asbestos.  Was treated with IV steroids along with antibiotics. Appears he was in normal sinus rhythm by review of notes that admission.  He presented back to the ED on 01/01/2019 reporting worsening dyspnea and his son also reported he was more confused than normal. ABG's showed a respiratory acidosis and he required BiPAP initially. Other labs showed WBC 8.2, Hgb 14.2, platelets 216, Na+ 142, K+ 5.8, and creatinine 1.88 (previously 1.02 two weeks prior). BNP 885. COVID negative. HS Troponin 20 with repeat of 21. TSH 0.435. Mg 2.0. Initial CXR showed cardiomegaly with vascular congestion and small bilateral pleural effusions. EKG shows what appears to be most consistent with rate-controlled atrial flutter, HR 80, with known RBBB.   He was started on IV Lasix 40mg  daily on admission and titrated to BID dosing on 8/31. Creatinine has  improved with diuresis, now at 0.98. Net output of -4.5L thus far. Variable weights recorded and unreliable trend.   In talking with the patient today, he is a very wandering historian and typically refers back to events from 2015 when he required ablation and stent placement. In talking about his most recent symptoms, he reports having dyspnea on exertion which has been chronic for the past several years. He did notice worsening orthopnea and lower extremity edema prior to admission. When asked about chest pain he reports having "his usual symptoms". Does not elaborate on this. He denies any recent palpitations. Says that weight is typically at 215 lbs on his home scales but he has not weighed himself in a few weeks.   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 s/p cath in 2015 showing occluded RCA and high-grade LCx stenosis treated with DESx2 to LCx   Cancer [facility])    Depression    Diverticulitis    hospital 2011 [facility]   Diverticulosis    GERD (gastroesophageal reflux disease)    Glaucoma 2016   bilateral   Hernia of unspecified site of abdominal cavity without mention of obstruction or gangrene    hiatal   Hyperlipidemia LDL goal <70 04/20/2014   Hypertension    IBS (irritable bowel syndrome)    Myocardial infarction ([place]) 01/2014   NSTEMI   Peripheral vascular disease ([place])  ankle brachial index of 0.79 on the right and 0.61 on the left.    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    SOB (shortness of breath)     Past Surgical History:  Procedure Laterality Date   CARDIAC CATHETERIZATION     CATARACT EXTRACTION     CORONARY ANGIOPLASTY     HERNIA REPAIR     LEFT HEART CATHETERIZATION WITH CORONARY ANGIOGRAM N/A 01/17/2014   Procedure: LEFT HEART CATHETERIZATION  WITH CORONARY ANGIOGRAM;  Surgeon: Leonie Man, MD;  Location: [facility] CATH LAB;  Service: Cardiovascular;  Laterality: N/A;   left shoulder      x 3   NISSEN FUNDOPLICATION     right elbow surgery     x 2   right knee arthroscopy     squamous cell skin cancer     Left Hand   SUPRAVENTRICULAR TACHYCARDIA ABLATION N/A 01/16/2014   Procedure: SUPRAVENTRICULAR TACHYCARDIA ABLATION;  Surgeon: Evans Lance, MD;  Location: [facility] CATH LAB;  Service: Cardiovascular;  Laterality: N/A;     Home Medications:  Prior to Admission medications   Medication Sig Start Date End Date Taking? Authorizing Provider  albuterol (VENTOLIN HFA) 108 (90 Base) MCG/ACT inhaler Inhale 2 puffs into the lungs every 6 (six) hours as needed for wheezing or shortness of breath. 12/19/18  Yes Shah, Pratik D, DO  alprazolam [name]) 2 MG tablet Take 1 tablet (2 mg total) by mouth 2 (two) times daily. 05/02/14  Yes Theodis Blaze, MD  amitriptyline (ELAVIL) 100 MG tablet Take 100 mg by mouth at bedtime.   Yes [provider]  amLODipine (NORVASC) 2.5 MG tablet TAKE 1 TABLET BY MOUTH  DAILY Patient taking differently: Take 2.5 mg by mouth daily.  04/10/17  Yes Croitoru, Mihai, MD  aspirin EC 81 MG tablet Take 1 tablet (81 mg total) by mouth daily. Or Enteric Coated (EC) 81 mg 11/23/14  Yes Barrett, Evelene Croon, PA-C  atorvastatin (LIPITOR) 40 MG tablet Take 1 tablet (40 mg total) by mouth daily at 6 PM. 12/19/18 01/18/19 Yes Shah, Pratik D, DO  bisacodyl (DULCOLAX) 5 MG EC tablet Take 5 mg by mouth daily as needed for mild constipation or moderate constipation.   Yes [provider]  budesonide-formoterol (SYMBICORT) 80-4.5 MCG/ACT inhaler Inhale 2 puffs into the lungs every morning. 12/19/18  Yes Shah, Pratik D, DO  docusate sodium (COLACE) 100 MG capsule Take 100 mg by mouth daily as needed for mild constipation.   Yes [provider]  finasteride (PROSCAR) 5 MG tablet Take 5 mg by mouth every morning.     Yes [provider]  gabapentin (NEURONTIN) 800 MG tablet Take 1 tablet (800 mg total) by mouth 3 (three) times daily. 08/23/18  Yes Ward Givens, NP  guaiFENesin (MUCINEX) 600 MG 12 hr tablet Take 600 mg by mouth 2 (two) times daily.    Yes [provider]  latanoprost (XALATAN) 0.005 % ophthalmic solution Place 1 drop into both eyes at bedtime.   Yes [provider]  metoprolol tartrate (LOPRESSOR) 25 MG tablet Take 12.5 mg by mouth 2 (two) times daily.   Yes [provider]  nitroGLYCERIN (NITROSTAT) 0.4 MG SL tablet Place 1 tablet (0.4 mg total) under the tongue every 5 (five) minutes x 3 doses as needed for chest pain. 04/20/14  Yes [name], NP  tamsulosin (FLOMAX) 0.4 MG CAPS capsule Take  0.4 mg by mouth at bedtime.   Yes [provider]  lamoTRIgine (LAMICTAL) 100 MG tablet Take 1 tablet (100 mg total) by mouth 2 (two) times daily. 01/23/16 01/29/16  Kathrynn Ducking, MD    Inpatient Medications: Scheduled Meds:  amitriptyline  100 mg Oral QHS   aspirin EC  81 mg Oral Daily   atorvastatin  40 mg Oral q1800   budesonide (PULMICORT) nebulizer solution  0.5 mg Nebulization BID   Chlorhexidine Gluconate Cloth  6 each Topical Daily   finasteride  5 mg Oral q morning - 10a   furosemide  40 mg Intravenous BID   heparin  5,000 Units Subcutaneous Q8H   ipratropium-albuterol  3 mL Nebulization Q6H   mouth rinse  15 mL Mouth Rinse BID   methylPREDNISolone (SOLU-MEDROL) injection  60 mg Intravenous Q12H   metoprolol tartrate  12.5 mg Oral BID   tamsulosin  0.4 mg Oral QHS   Continuous Infusions:  PRN Meds: acetaminophen **OR** acetaminophen, ALPRAZolam, bisacodyl, bisacodyl, ipratropium-albuterol, nitroGLYCERIN, ondansetron **OR** ondansetron (ZOFRAN) IV, traMADol  Allergies:    Allergies  Allergen Reactions   Codeine Itching   Trileptal [Oxcarbazepine]     Other reaction(s): Other (See Comments) Abnormal sodium  levels Hyponatremia    Social History:   Social History   Socioeconomic History   Marital status: Widowed    Spouse name: Not on file   Number of children: 2   Years of education: 12   Highest education level: Not on file  Occupational History   Occupation: Associate Professor strain: Not on file   Food insecurity    Worry: Not on file    Inability: Not on file   Transportation needs    Medical: Not on file    Non-medical: Not on file  Tobacco Use   Smoking status: Former Smoker    Packs/day: 2.50    Years: 50.00    Pack years: 125.00    Types: Cigarettes    Quit date: 05/05/2005    Years since quitting: 13.6   Smokeless tobacco: Former Systems developer    Quit date: 05/03/2012  Substance and Sexual Activity   Alcohol use: No    Alcohol/week: 0.0 standard drinks    Comment: last use 2013   Drug use: No   Sexual activity: Not on file    Comment: widowed, Veteran  Lifestyle   Physical activity    Days per week: Not on file    Minutes per session: Not on file   Stress: Not on file  Relationships   Social connections    Talks on phone: Not on file    Gets together: Not on file    Attends religious service: Not on file    Active member of club or organization: Not on file    Attends meetings of clubs or organizations: Not on file    Relationship status: Not on file   Intimate partner violence    Fear of current or ex partner: Not on file    Emotionally abused: Not on file    Physically abused: Not on file    Forced sexual activity: Not on file  Other Topics Concern   Not on file  Social History Narrative   Patient drinks 3 cups of caffeine daily.   Patient is right handed.     Family History:    Family History  Problem Relation Age of Onset   Colon cancer Mother    Colon  cancer Maternal Aunt    Colon cancer Maternal Uncle    Heart attack Neg Hx    Stroke Neg Hx       Review of Systems    General:  No chills,  fever, night sweats or weight changes.  Cardiovascular:  No chest pain, palpitations, paroxysmal nocturnal dyspnea. Positive for dyspnea on exertion, edema, and orthopnea.  Dermatological: No rash, lesions/masses Respiratory: No cough, dyspnea Urologic: No hematuria, dysuria Abdominal:   No nausea, vomiting, diarrhea, bright red blood per rectum, melena, or hematemesis Neurologic:  No visual changes, wkns, changes in mental status. All other systems reviewed and are otherwise negative except as noted above.  Physical Exam/Data    Vitals:   01/05/19 0513 01/05/19 0521 01/05/19 0722 01/05/19 0727  BP:  (!) 162/89    Pulse:  80    Resp:  20    Temp:      TempSrc:      SpO2:  93% 93% 93%  Weight: 96.2 kg     Height:        Intake/Output Summary (Last 24 hours) at 01/05/2019 1238 Last data filed at 01/05/2019 0900 Gross per 24 hour  Intake 720 ml  Output 2800 ml  Net -2080 ml   Filed Weights   01/02/19 0834 01/05/19 0513  Weight: 95.7 kg 96.2 kg   Body mass index is 28.76 kg/m.   General: Pleasant male sounding in NAD Psych: Normal affect. Neuro: Alert and oriented X 3. Moves all extremities spontaneously. HEENT: Normal  Neck: Supple without bruits or JVD. Lungs:  Resp regular and unlabored, rhonchi throughout. Heart: Irregularly irregular no s3, s4, or murmurs. Abdomen: Soft, non-tender, non-distended, BS + x 4.  Extremities: No clubbing or cyanosis. 1+ pitting edema up to mid-shins bilaterally. DP/PT/Radials 2+ and equal bilaterally.   EKG:  The EKG was personally reviewed and demonstrates: Rate-controlled atrial flutter, HR 80, with known RBBB.  Telemetry:  Telemetry was personally reviewed and demonstrates: Atrial flutter with HR in 60's to 80's.    Labs/Studies     Relevant CV Studies:  Echocardiogram: 01/02/2019 IMPRESSIONS    1. The left ventricle has normal systolic function, with an ejection fraction of 55-60%. The cavity size was normal. There is mildly  increased left ventricular wall thickness. Left ventricular diastolic Doppler parameters are indeterminate.  2. The right ventricle has normal systolic function. The cavity was normal. There is no increase in right ventricular wall thickness. Right ventricular systolic pressure could not be assessed.  3. No evidence of mitral valve stenosis.  4. The aortic valve is tricuspid. Mild thickening of the aortic valve. Mild calcification of the aortic valve. No stenosis of the aortic valve.  5. The aorta is normal unless otherwise noted.  6. The ascending aorta is normal in size and structure.  Laboratory Data:  Chemistry Recent Labs  Lab 01/03/19 0425  01/03/19 1155 01/04/19 0525 01/05/19 0649  NA 139  --   --  137 134*  K 4.6   < > 4.9 4.1 4.1  CL 94*  --   --  90* 93*  CO2 35*  --   --  36* 36*  GLUCOSE 144*  --   --  150* 129*  BUN 45*  --   --  45* 37*  CREATININE 1.32*  --   --  1.11 0.98  CALCIUM 8.6*  --   --  8.5* 8.5*  GFRNONAA 53*  --   --  >60 >60  GFRAA >  60  --   --  >60 >60  ANIONGAP 10  --   --  11 5   < > = values in this interval not displayed.    Recent Labs  Lab 01/01/19 2235 01/02/19 0957  PROT 6.6 6.8  ALBUMIN 3.1* 3.3*  AST 28 26  ALT 32 36  ALKPHOS 72 80  BILITOT 0.6 0.9   Hematology Recent Labs  Lab 01/01/19 2327 01/02/19 0606 01/02/19 0957  WBC 8.2  --  5.3  RBC 4.94  --  5.12  HGB 14.2  --  14.5  HCT 51.1 49.7 51.8  MCV 103.4*  --  101.2*  MCH 28.7  --  28.3  MCHC 27.8*  --  28.0*  RDW 20.8*  --  20.3*  PLT 216  --  219   Cardiac EnzymesNo results for input(s): TROPONINI in the last 168 hours. No results for input(s): TROPIPOC in the last 168 hours.  BNP Recent Labs  Lab 01/01/19 2327  BNP 885.0*    DDimer No results for input(s): DDIMER in the last 168 hours.  Radiology/Studies:  US Venous Img Lower Bilateral  Result Date: 01/02/2019 CLINICAL DATA:  73 year old male with lower extremity edema EXAM: BILATERAL LOWER EXTREMITY  VENOUS DOPPLER ULTRASOUND TECHNIQUE: Gray-scale sonography with graded compression, as well as color Doppler and duplex ultrasound were performed to evaluate the lower extremity deep venous systems from the level of the common femoral vein and including the common femoral, femoral, profunda femoral, popliteal and calf veins including the posterior tibial, peroneal and gastrocnemius veins when visible. The superficial great saphenous vein was also interrogated. Spectral Doppler was utilized to evaluate flow at rest and with distal augmentation maneuvers in the common femoral, femoral and popliteal veins. COMPARISON:  None. FINDINGS: RIGHT LOWER EXTREMITY Common Femoral Vein: No evidence of thrombus. Normal compressibility, respiratory phasicity and response to augmentation. Increased pulsatility of the venous waveforms. Saphenofemoral Junction: No evidence of thrombus. Normal compressibility and flow on color Doppler imaging. Profunda Femoral Vein: No evidence of thrombus. Normal compressibility and flow on color Doppler imaging. Femoral Vein: No evidence of thrombus. Normal compressibility, respiratory phasicity and response to augmentation. Popliteal Vein: No evidence of thrombus. Normal compressibility, respiratory phasicity and response to augmentation. Calf Veins: No evidence of thrombus. Normal compressibility and flow on color Doppler imaging. Superficial Great Saphenous Vein: No evidence of thrombus. Normal compressibility. Venous Reflux:  None. Other Findings:  None. LEFT LOWER EXTREMITY Common Femoral Vein: No evidence of thrombus. Normal compressibility, respiratory phasicity and response to augmentation. Increased pulsatility of the venous waveforms. Saphenofemoral Junction: No evidence of thrombus. Normal compressibility and flow on color Doppler imaging. Profunda Femoral Vein: No evidence of thrombus. Normal compressibility and flow on color Doppler imaging. Femoral Vein: No evidence of thrombus. Normal  compressibility, respiratory phasicity and response to augmentation. Popliteal Vein: No evidence of thrombus. Normal compressibility, respiratory phasicity and response to augmentation. Calf Veins: No evidence of thrombus. Normal compressibility and flow on color Doppler imaging. Superficial Great Saphenous Vein: No evidence of thrombus. Normal compressibility. Venous Reflux:  None. Other Findings:  None. IMPRESSION: 1. No evidence of deep venous thrombosis in either lower extremity. 2. Increased pulsatility of the venous waveforms suggests elevated right heart pressures. Findings can be seen in tricuspid regurgitation, right heart failure, pulmonary hypertension and COPD. Electronically Signed   By: Jacqulynn Cadet M.D.   On: 01/02/2019 14:30   Portable Chest 1 View  Result Date: 01/03/2019 CLINICAL DATA:  Shortness of breath  and altered mental status. Congestive heart failure. EXAM: PORTABLE CHEST 1 VIEW COMPARISON:  01/01/2019 and 08/20/2017 FINDINGS: Stable cardiomegaly and pulmonary vascular congestion. Aortic atherosclerosis. Improved aeration of both lungs is seen. Bilateral pleural-parenchymal scarring remains stable compared to 2019. No evidence of acute infiltrate or pleural effusion. IMPRESSION: Stable cardiomegaly, pulmonary vascular congestion, and bilateral pleural-parenchymal scarring. No acute findings. Electronically Signed   By: Marlaine Hind M.D.   On: 01/03/2019 05:19   Dg Chest Port 1 View  Result Date: 01/01/2019 CLINICAL DATA:  73 year old male with shortness of breath EXAM: PORTABLE CHEST 1 VIEW COMPARISON:  Chest radiograph dated 12/15/2018 FINDINGS: There is cardiomegaly with vascular congestion. There is shallow inspiration. Small bilateral pleural effusions and bibasilar densities similar to prior radiograph which may represent atelectasis or infiltrate. No pneumothorax. Stable cardiomegaly. No acute osseous pathology. IMPRESSION: 1. Cardiomegaly with vascular congestion. 2.  Small bilateral pleural effusions and bibasilar densities similar to prior radiograph. Electronically Signed   By: Anner Crete M.D.   On: 01/01/2019 23:20     Assessment & Plan    1. New-onset Atrial Flutter - He reports having chronic dyspnea on exertion but denies any recent palpitations and has overall been unaware of his arrhythmia. Electrolytes and TSH within normal limits this admission. Rates have overall remained well controlled in the 60's to 80's. Will continue Lopressor 12.5 mg twice daily for rate control. - This patients CHA2DS2-VASc Score and unadjusted Ischemic Stroke Rate (% per year) is equal to 3.2 % stroke rate/year from a score of 3 (HTN, Vascular, Age).  Reviewed anticoagulation with the patient and he seems okay with initiating this. He does experience easy bruising, therefore would recommend stopping ASA with initiation of Eliquis 5 mg twice daily.  After 3 to 4 weeks of anticoagulation, could consider DCCV but given his overall asymptomatic state he prefers conservative management at this time.  2. Acute on Chronic Diastolic CHF Exacerbation - Presented with worsening dyspnea on exertion, orthopnea, and lower extremity edema with BNP elevated to 885 and CXR showing small bilateral effusions. Echo shows a preserved EF of 55 to 60%.  He has responded well to IV Lasix and is overall -4.5 L thus far. It does not appear he was on diuretic therapy prior to admission and suspect he will require a standing dose of Lasix upon discharge. Given his persistent pitting edema by examination today, would continue with IV Lasix today.  3. CAD  - s/p cath in 2015 showing occluded RCA and high-grade LCx stenosis treated with DESx2 to LCx.  Troponin values negative this admission and echocardiogram shows a preserved EF with no regional wall motion abnormalities. No plans for further ischemic testing at this time.  - Continue beta-blocker and statin therapy. Would discontinue ASA given the  need for anticoagulation.  4. AVNRT  - s/p RFCA by Dr. Lovena Le in 2015. Remains on BB therapy.   5. HLD - followed by PCP. Continued on PTA Atorvastatin 40mg  daily.   6. COPD - Was using supplemental oxygen as needed prior to admission. Now on 4 L nasal cannula with saturations in the 90's. - further management per admitting team.    For questions or updates, please contact [place] Please consult [email or web address] for contact info under Cardiology/STEMI.  Signed, Erma Heritage, PA-C 01/05/2019, 12:38 PM Pager: [phone]  The patient was seen and examined, and I agree with the history, physical exam, assessment and plan as documented above, with modifications as noted below. I have also  personally reviewed all relevant documentation, old records, labs, and both radiographic and cardiovascular studies. I have also independently interpreted old and new ECG's.  Briefly, this is a 73 year old male with coronary artery disease with prior drug-eluting stent placement, AVNRT status post radiofrequency ablation in 2015, and COPD whom we have been asked to evaluate for new onset atrial flutter and acute diastolic heart failure.  He was hospitalized in mid July for acute on chronic hypoxic respiratory failure and sepsis with community-acquired pneumonia.  He presented to the ED on 01/01/2019 with worsening shortness of breath and was found to have new onset controlled atrial flutter.  He was also found to be in decompensated diastolic heart failure.  He has been started on IV Lasix 40 mg daily with good output although he continues to have some volume overload.  At the time my evaluation he is sitting up in a chair and is stable.  Physical exam notable for irregular rhythm and diffuse rhonchi with 1+ bilateral lower extremity edema.  Currently on Lopressor 12.5 mg twice daily with good heart rate control.  Given the need for systemic anticoagulation, will stop aspirin and start Eliquis  5 mg twice daily.  Given continued volume overload I would continue IV Lasix for now.    Blood pressure is elevated at present.  He was on low-dose amlodipine 2.5 mg prior to admission.  This may need to be resumed at 5 mg daily if he remains hypertensive.  Treatment for COPD exacerbation as per internal medicine.  Kate Sable, MD, [facility]  01/05/2019 1:37 PM

## 2019-01-05 NOTE — Progress Notes (Signed)
PROGRESS NOTE  Curtis [name] [id number] DOB: [birth year] DOA: 01/01/2019 PCP: [name], MD  Brief History: 74 year old male with a history of pulmonary asbestosis, COPD, coronary artery disease, hypertension, hyperlipidemia, SVT status post ablation presenting with altered mental status. The patient was recently admitted to the hospital from 12/15/2018 through 12/19/2018 for sepsis secondary to pneumonia, COPD exacerbation and acute on chronic respiratory failure. He was discharged home with home health physical therapy,to finish Augmentin and prednisone taper. The patient's son states that the patient has had a generalized weakness for about a week with some confusion. This has worsened over the past 3 days to the point where the patient had not been taking any of his medications. On the day of admission, the patient was somnolent and difficult to arouse. EMS was activated, and the patient was noted to be somewhat cyanotic on his lips but arousable at that time. In the emergency room, the patient was placed on BiPAP. Chest x-ray showed vascular congestion. He was started on steroids and intravenous furosemide. The patient was afebrile hemodynamically stable with ABG initially showing 7.173/111/74 on 35%. He was placed on BiPAP. BNP was 885. Troponin was 20>>>21. Lactic acid was 1.0.  Assessment/Plan: Acute on chronic respiratory failure with hypoxia and hypercarbia -Secondary to pulmonary edema and COPD exacerbation -Personally reviewed chest x-ray--bilateral pleural plaques. Bilateral pleural effusion, right greater than left. Increased interstitial markings -Weaned off BiPAP>> currently using 2-3 L which is essentially his baseline supplementation. -Still complaining of shortness of breath.  COPD exacerbation -Continue Pulmicort -Continue IV Solu-Medrol -Continue duo nebs -Continue oxygen supplementation  Acute diastolic CHF -Continue lasix to 40 mg  IV bid -provided education to patient regarding diet and fluid intake -Echocardiogram--EF 55-60%, normal RV, indeterminant diastolic  -Daily weights and Strict I's and O's -We will give 2 doses of metolazone and follow urine output.  Dysrhythmia/SVT/Aflutter -personally reviewed EKG--consistent with aflutter -consult cardiology in setting of prior SVT with ablation and CHF -continue metoprolol and following cardiology recommendations we will start the use of Eliquis. -Follow further recommendations. -Continue to monitor on telemetry -Follow electrolytes and replete as needed.  Urinary retention -Placed Foley catheter 8/30 -Failed voiding trial 9/1 and Foley catheter has been replaced  Hyperkalemia -Patient received temporizing measures -Improved/resolved with IV diuresis  Acute kidney injury -Baseline creatinine 0.9-1.2 -Presented with serum creatinine 1.8 -Continue to follow renal function trend with acute diuresis. -Essentially back to baseline At this time  Coronary artery disease -No chest pain presently -Personally reviewed EKG--sinus rhythm with nonconducted atrial beats, nonspecific ST changes -Troponins not consistent with ACS  Anxiety/depression -Continue Xanax and Elavil -PMP AWARE reviewed--90 day supply of xanax 2 mg bid refilled in August 2020  Essential hypertension -Continue holding amlodipine and monitor clinically -Continue metoprolol tartrate -Heart healthy diet encouraged.  Frequent Falls -Following recommendations by PT eval; patient will need a skilled nursing facility for further care and rehabilitation. -[id number] -folic acid -[id number]  Lower extremity edema, recurrent left -Venous duplex ruleoutDVT--neg -Swelling secondary to acute on chronic CHF.  Left Heel Pressure Injury -Pursuit off load pressure -Continue local wound care and preventive measurement.  Disposition Plan:Homein 1-2days  Family Communication:No family  at bedside.  Consultants:cardiology  Code Status:DNI  DVT Prophylaxis: [place] Heparin    Procedures: As Listed in Progress Note Above  Antibiotics: None   Subjective: Patient reports breathing continued to improve; no chest pain.  No nausea, no vomiting, no fever.  Still with  signs of fluid overload on exam.  Failed voiding trial on 01/04/2019 and require replacement of Foley catheter.   Objective: Vitals:   01/05/19 0521 01/05/19 0722 01/05/19 0727 01/05/19 1317  BP: (!) 162/89     Pulse: 80     Resp: 20     Temp:      TempSrc:      SpO2: 93% 93% 93% 100%  Weight:      Height:        Intake/Output Summary (Last 24 hours) at 01/05/2019 1809 Last data filed at 01/05/2019 1300 Gross per 24 hour  Intake 720 ml  Output 1600 ml  Net -880 ml   Weight change:  Exam: General exam: Alert, awake, oriented x 3; still reporting some shortness of breath with minimal exertion.  Denies chest pain, no nausea, no vomiting. Respiratory system: Fine crackles at the bases, positive rhonchi. Cardiovascular system:Rate controlled; no rubs, no gallops. Gastrointestinal system: Abdomen is nondistended, soft and nontender. No organomegaly or masses felt. Normal bowel sounds heard. Central nervous system: Alert and oriented. No focal neurological deficits. Extremities: No cyanosis or clubbing.  1-2+ edema bilaterally. Skin: No petechiae; no open wounds. Psychiatry: Judgement and insight appear normal. Mood & affect appropriate.    Data Reviewed: I have personally reviewed following labs and imaging studies  Basic Metabolic Panel: Recent Labs  Lab 01/02/19 0606 01/02/19 0607 01/02/19 0957  01/03/19 0425 01/03/19 [id number] 01/03/19 1155 01/04/19 0525 01/05/19 0649  NA 142  --  143  --  139  --   --  137 134*  K 4.9  --  4.7   < > 4.6 4.7 4.9 4.1 4.1  CL 97*  --  95*  --  94*  --   --  90* 93*  CO2 31  --  35*  --  35*  --   --  36* 36*  GLUCOSE 163* 161* 132*  --  144*  --   --   150* 129*  BUN 45*  --  45*  --  45*  --   --  45* 37*  CREATININE 1.72*  --  1.52*  --  1.32*  --   --  1.11 0.98  CALCIUM 9.0  --  9.0  --  8.6*  --   --  8.5* 8.5*  MG  --   --  2.0  --  1.9  --   --  1.9 1.9   < > = values in this interval not displayed.   Liver Function Tests: Recent Labs  Lab 01/01/19 2235 01/02/19 0957  AST 28 26  ALT 32 36  ALKPHOS 72 80  BILITOT 0.6 0.9  PROT 6.6 6.8  ALBUMIN 3.1* 3.3*   CBC: Recent Labs  Lab 01/01/19 2327 01/02/19 0606 01/02/19 0957  WBC 8.2  --  5.3  NEUTROABS 6.6  --   --   HGB 14.2  --  14.5  HCT 51.1 49.7 51.8  MCV 103.4*  --  101.2*  PLT 216  --  219   Urine analysis:    Component Value Date/Time   COLORURINE AMBER (A) 01/02/2019 0046   APPEARANCEUR HAZY (A) 01/02/2019 0046   LABSPEC 1.026 01/02/2019 0046   PHURINE 5.0 01/02/2019 0046   GLUCOSEU NEGATIVE 01/02/2019 0046   HGBUR NEGATIVE 01/02/2019 0046   BILIRUBINUR NEGATIVE 01/02/2019 0046   KETONESUR NEGATIVE 01/02/2019 0046   PROTEINUR 100 (A) 01/02/2019 0046   UROBILINOGEN 0.2 02/12/2015 1859   NITRITE NEGATIVE  01/02/2019 [place] 01/02/2019 0046    Recent Results (from the past 240 hour(s))  SARS Coronavirus 2 [facility] order, Performed in [facility] hospital lab) Nasopharyngeal Nasopharyngeal Swab     Status: None   Collection Time: 01/01/19 10:21 PM   Specimen: Nasopharyngeal Swab  Result Value Ref Range Status   SARS Coronavirus 2 NEGATIVE NEGATIVE Final    Comment: (NOTE) If result is NEGATIVE SARS-CoV-2 target nucleic acids are NOT DETECTED. The SARS-CoV-2 RNA is generally detectable in upper and lower  respiratory specimens during the acute phase of infection. The lowest  concentration of SARS-CoV-2 viral copies this assay can detect is 250  copies / mL. A negative result does not preclude SARS-CoV-2 infection  and should not be used as the sole basis for treatment or other  patient management decisions.  A negative result  may occur with  improper specimen collection / handling, submission of specimen other  than nasopharyngeal swab, presence of viral mutation(s) within the  areas targeted by this assay, and inadequate number of viral copies  (<250 copies / mL). A negative result must be combined with clinical  observations, patient history, and epidemiological information. If result is POSITIVE SARS-CoV-2 target nucleic acids are DETECTED. The SARS-CoV-2 RNA is generally detectable in upper and lower  respiratory specimens dur ing the acute phase of infection.  Positive  results are indicative of active infection with SARS-CoV-2.  Clinical  correlation with patient history and other diagnostic information is  necessary to determine patient infection status.  Positive results do  not rule out bacterial infection or co-infection with other viruses. If result is PRESUMPTIVE POSTIVE SARS-CoV-2 nucleic acids MAY BE PRESENT.   A presumptive positive result was obtained on the submitted specimen  and confirmed on repeat testing.  While 2019 novel coronavirus  (SARS-CoV-2) nucleic acids may be present in the submitted sample  additional confirmatory testing may be necessary for epidemiological  and / or clinical management purposes  to differentiate between  SARS-CoV-2 and other Sarbecovirus currently known to infect humans.  If clinically indicated additional testing with an alternate test  methodology [phone]) is advised. The SARS-CoV-2 RNA is generally  detectable in upper and lower respiratory sp ecimens during the acute  phase of infection. The expected result is Negative. Fact Sheet for Patients:  StrictlyIdeas.no Fact Sheet for Healthcare Providers: BankingDealers.co.za This test is not yet approved or cleared by the [place] FDA and has been authorized for detection and/or diagnosis of SARS-CoV-2 by FDA under an Emergency Use Authorization (EUA).   This EUA will remain in effect (meaning this test can be used) for the duration of the COVID-19 declaration under Section 564(b)(1) of the Act, 21 U.S.C. section 360bbb-3(b)(1), unless the authorization is terminated or revoked sooner. Performed at [facility], [address], [place], [place] [address]   Urine culture     Status: Abnormal   Collection Time: 01/02/19 12:46 AM   Specimen: Urine, Catheterized  Result Value Ref Range Status   Specimen Description   Final    URINE, CATHETERIZED Performed at [facility], [address], [place], [place] [address]    Special Requests   Final    Normal Performed at [facility], [address], [place], [place] [address]    Culture (A)  Final    >=100,000 COLONIES/mL STAPHYLOCOCCUS SPECIES (COAGULASE NEGATIVE)   Report Status 01/04/2019 FINAL  Final   Organism ID, Bacteria STAPHYLOCOCCUS SPECIES (COAGULASE NEGATIVE) (A)  Final  Susceptibility   Staphylococcus species (coagulase negative) - MIC*    CIPROFLOXACIN >=8 RESISTANT Resistant     GENTAMICIN <=0.5 SENSITIVE Sensitive     NITROFURANTOIN <=16 SENSITIVE Sensitive     OXACILLIN >=4 RESISTANT Resistant     TETRACYCLINE 2 SENSITIVE Sensitive     VANCOMYCIN 2 SENSITIVE Sensitive     TRIMETH/SULFA <=10 SENSITIVE Sensitive     CLINDAMYCIN <=0.25 SENSITIVE Sensitive     RIFAMPIN <=0.5 SENSITIVE Sensitive     Inducible Clindamycin NEGATIVE Sensitive     * >=100,000 COLONIES/mL STAPHYLOCOCCUS SPECIES (COAGULASE NEGATIVE)  MRSA PCR Screening     Status: None   Collection Time: 01/02/19  8:35 AM   Specimen: Nasopharyngeal  Result Value Ref Range Status   MRSA by PCR NEGATIVE NEGATIVE Final    Comment:        The GeneXpert MRSA Assay (FDA approved for NASAL specimens only), is one component of a comprehensive MRSA colonization surveillance program. It is not intended to diagnose MRSA infection nor to guide or monitor treatment for MRSA infections. Performed at [facility], [address] [place], [place] [address]   Culture, sputum-assessment     Status: None   Collection Time: 01/02/19  8:39 AM   Specimen: Sputum  Result Value Ref Range Status   Specimen Description SPUTUM  Final   Special Requests NONE  Final   Sputum evaluation   Final    Sputum specimen not acceptable for testing.  Please recollect.   NOTIFIED NURSE DAVIS AT 1300 ON 01/02/2019 BY EVA Performed at [facility], [address] [place], [place] [address]    Report Status 01/02/2019 FINAL  Final     Scheduled Meds:  amitriptyline  100 mg Oral QHS   aspirin EC  81 mg Oral Daily   atorvastatin  40 mg Oral q1800   budesonide (PULMICORT) nebulizer solution  0.5 mg Nebulization BID   Chlorhexidine Gluconate Cloth  6 each Topical Daily   finasteride  5 mg Oral q morning - 10a   furosemide  40 mg Intravenous BID   heparin  5,000 Units Subcutaneous Q8H   ipratropium-albuterol  3 mL Nebulization Q6H   mouth rinse  15 mL Mouth Rinse BID   methylPREDNISolone (SOLU-MEDROL) injection  60 mg Intravenous Q12H   metolazone  2.5 mg Oral Daily   metoprolol tartrate  12.5 mg Oral BID   tamsulosin  0.4 mg Oral QHS   Continuous Infusions:  Procedures/Studies: US Venous Img Lower Bilateral  Result Date: 01/02/2019 CLINICAL DATA:  73 year old male with lower extremity edema EXAM: BILATERAL LOWER EXTREMITY VENOUS DOPPLER ULTRASOUND TECHNIQUE: Gray-scale sonography with graded compression, as well as color Doppler and duplex ultrasound were performed to evaluate the lower extremity deep venous systems from the level of the common femoral vein and including the common femoral, femoral, profunda femoral, popliteal and calf veins including the posterior tibial, peroneal and gastrocnemius veins when visible. The superficial great saphenous vein was also interrogated. Spectral Doppler was utilized to evaluate flow at rest and with distal augmentation maneuvers in the common femoral, femoral  and popliteal veins. COMPARISON:  None. FINDINGS: RIGHT LOWER EXTREMITY Common Femoral Vein: No evidence of thrombus. Normal compressibility, respiratory phasicity and response to augmentation. Increased pulsatility of the venous waveforms. Saphenofemoral Junction: No evidence of thrombus. Normal compressibility and flow on color Doppler imaging. Profunda Femoral Vein: No evidence of thrombus. Normal compressibility and flow on color Doppler imaging. Femoral Vein: No evidence of thrombus. Normal compressibility, respiratory  phasicity and response to augmentation. Popliteal Vein: No evidence of thrombus. Normal compressibility, respiratory phasicity and response to augmentation. Calf Veins: No evidence of thrombus. Normal compressibility and flow on color Doppler imaging. Superficial Great Saphenous Vein: No evidence of thrombus. Normal compressibility. Venous Reflux:  None. Other Findings:  None. LEFT LOWER EXTREMITY Common Femoral Vein: No evidence of thrombus. Normal compressibility, respiratory phasicity and response to augmentation. Increased pulsatility of the venous waveforms. Saphenofemoral Junction: No evidence of thrombus. Normal compressibility and flow on color Doppler imaging. Profunda Femoral Vein: No evidence of thrombus. Normal compressibility and flow on color Doppler imaging. Femoral Vein: No evidence of thrombus. Normal compressibility, respiratory phasicity and response to augmentation. Popliteal Vein: No evidence of thrombus. Normal compressibility, respiratory phasicity and response to augmentation. Calf Veins: No evidence of thrombus. Normal compressibility and flow on color Doppler imaging. Superficial Great Saphenous Vein: No evidence of thrombus. Normal compressibility. Venous Reflux:  None. Other Findings:  None. IMPRESSION: 1. No evidence of deep venous thrombosis in either lower extremity. 2. Increased pulsatility of the venous waveforms suggests elevated right heart pressures. Findings  can be seen in tricuspid regurgitation, right heart failure, pulmonary hypertension and COPD. Electronically Signed   By: Jacqulynn Cadet M.D.   On: 01/02/2019 14:30   Portable Chest 1 View  Result Date: 01/03/2019 CLINICAL DATA:  Shortness of breath and altered mental status. Congestive heart failure. EXAM: PORTABLE CHEST 1 VIEW COMPARISON:  01/01/2019 and 08/20/2017 FINDINGS: Stable cardiomegaly and pulmonary vascular congestion. Aortic atherosclerosis. Improved aeration of both lungs is seen. Bilateral pleural-parenchymal scarring remains stable compared to 2019. No evidence of acute infiltrate or pleural effusion. IMPRESSION: Stable cardiomegaly, pulmonary vascular congestion, and bilateral pleural-parenchymal scarring. No acute findings. Electronically Signed   By: Marlaine Hind M.D.   On: 01/03/2019 05:19   Dg Chest Port 1 View  Result Date: 01/01/2019 CLINICAL DATA:  73 year old male with shortness of breath EXAM: PORTABLE CHEST 1 VIEW COMPARISON:  Chest radiograph dated 12/15/2018 FINDINGS: There is cardiomegaly with vascular congestion. There is shallow inspiration. Small bilateral pleural effusions and bibasilar densities similar to prior radiograph which may represent atelectasis or infiltrate. No pneumothorax. Stable cardiomegaly. No acute osseous pathology. IMPRESSION: 1. Cardiomegaly with vascular congestion. 2. Small bilateral pleural effusions and bibasilar densities similar to prior radiograph. Electronically Signed   By: Anner Crete M.D.   On: 01/01/2019 23:20   Dg Chest Port 1 View  Result Date: 12/15/2018 CLINICAL DATA:  Fever, weakness EXAM: PORTABLE CHEST 1 VIEW COMPARISON:  08/20/2017 chest radiograph. FINDINGS: Low lung volumes. Stable cardiomediastinal silhouette with moderate cardiomegaly. No pneumothorax. Calcified bilateral pleural plaques and pleural thickening, unchanged. No convincing pleural effusion. Patchy consolidation at both lung bases superimposed on chronic  reticular opacities. No overt pulmonary edema. IMPRESSION: 1. Patchy consolidation superimposed on chronic reticular opacities at the lung bases, cannot exclude aspiration or pneumonia. Chest radiograph follow-up advised. 2. 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 3. Moderate cardiomegaly without overt pulmonary edema. Electronically Signed   By: Ilona Sorrel M.D.   On: 12/15/2018 18:08   Dg Knee Complete 4 Views Right  Result Date: 12/15/2018 CLINICAL DATA:  Fall, right knee pain when moving EXAM: RIGHT KNEE - COMPLETE 4+ VIEW COMPARISON:  None. FINDINGS: No fracture or dislocation. Small suprapatellar right knee joint effusion. No suspicious focal osseous lesion. Small superior right patellar enthesophyte. No significant degenerative arthropathy. Posterior vascular calcifications. No radiopaque foreign body. IMPRESSION: Small suprapatellar right knee joint effusion, with  no right knee fracture or dislocation. Electronically Signed   By: Ilona Sorrel M.D.   On: 12/15/2018 18:05    Barton Dubois, MD  [facility] Pager [phone]  01/05/2019, 6:09 PM   LOS: 3 days

## 2019-01-05 NOTE — Progress Notes (Signed)
Physical Therapy Treatment Patient Details Name: Curtis [name] MRN: [id number] DOB: 1946-03-03 Today's Date: 01/05/2019    History of Present Illness Curtis [name]  is a 73 y.o. male, with history of pulmonary asbestosis, COPD, peripheral vascular disease, coronary artery disease status post MI in 2015, IBS, hypertension, hyperlipidemia, glaucoma, GERD, diverticulosis, history of AAA presents to the ED with son for altered mental status.  Unfortunately son is a longer at bed side at time of my exam.  History is collected from medical record and from ER staff.  Apparently patient had just been discharged from the hospital within the last 2 weeks.  At that time he had community-acquired pneumonia and acute hypoxic and hypercarbic respiratory failure.  He was discharged with instructions to follow-up with his primary care physician in 1 week.  He was continued on 4 L nasal cannula at home with up to 8 L for ambulation.  He did a steroid taper, continued Augmentin, was prescribed Symbicort, and received a rolling walker.  Son reported to the ED staff the patient was at first doing better but not quite to his baseline.  He started complaining of shortness of breath shortly after that discharge.  Then 2 days ago he started having confusion at home.  Son brought father in unfortunately found that he was in hypoxic hypercarbic respiratory failure once again.  He also had a chest x-ray that showed congestion.  His BNP was in the 800s.  Last one on file was in the 70s.  Patient's last echo was in 2015.  So patient is being admitted for acute hypoxic and hypercarbic respiratory failure secondary to COPD exacerbation, and CHF as well.    PT Comments    Patient had difficulty sitting up at bedside with head of bed flat, demonstrated poor return for propping up on elbows during supine to sitting, required assistance to help pull self up to sitting at bedside, demonstrates increased endurance/distance for gait  training without loss of balance and tolerated sitting up in chair after therapy.  Patient will benefit from continued physical therapy in hospital and recommended venue below to increase strength, balance, endurance for safe ADLs and gait.    Follow Up Recommendations  SNF;Supervision for mobility/OOB;Supervision - Intermittent     Equipment Recommendations  None recommended by PT    Recommendations for Other Services       Precautions / Restrictions Precautions Precautions: Fall Precaution Comments: Home O2 dependent Restrictions Weight Bearing Restrictions: No    Mobility  Bed Mobility Overal bed mobility: Needs Assistance Bed Mobility: Supine to Sit     Supine to sit: Min guard     General bed mobility comments: slow labored movement requring a little assist to pull self up to sitting  Transfers Overall transfer level: Needs assistance Equipment used: Rolling walker (2 wheeled) Transfers: Sit to/from [facility] Sit to Stand: Min guard Stand pivot transfers: Min guard       General transfer comment: good return demonstrated for proper hand placement/safety for sit to stands, stands to sit after verbal cueing  Ambulation/Gait Ambulation/Gait assistance: Supervision;Min guard Gait Distance (Feet): 50 Feet Assistive device: Rolling walker (2 wheeled) Gait Pattern/deviations: Decreased step length - right;Decreased step length - left;Decreased stride length Gait velocity: decreased   General Gait Details: increased endurance/distance while on 4 LPM with SpO2 between 89-93%, slightly labored cadence without loss of balance   Chief Strategy Officer  Modified Rankin (Stroke Patients Only)       Balance Overall balance assessment: Needs assistance Sitting-balance support: Feet supported;No upper extremity supported Sitting balance-Leahy Scale: Good Sitting balance - Comments: seated at bedside   Standing balance  support: During functional activity;Bilateral upper extremity supported Standing balance-Leahy Scale: Fair Standing balance comment: using RW                            Cognition Arousal/Alertness: Awake/alert Behavior During Therapy: WFL for tasks assessed/performed Overall Cognitive Status: Within Functional Limits for tasks assessed                                        Exercises General Exercises - Lower Extremity Long Arc Quad: Seated;AROM;Strengthening;Both;10 reps Hip Flexion/Marching: Seated;AROM;Strengthening;Both;10 reps Toe Raises: Seated;AROM;Strengthening;Both;10 reps Heel Raises: Seated;AROM;Strengthening;Both;10 reps    General Comments        Pertinent Vitals/Pain Pain Assessment: Faces Faces Pain Scale: Hurts a little bit Pain Location: neuropathy pain BLE Pain Descriptors / Indicators: Constant;Discomfort;Sore Pain Intervention(s): Limited activity within patient's tolerance;Monitored during session    Home Living                      Prior Function            PT Goals (current goals can now be found in the care plan section) Acute Rehab PT Goals Patient Stated Goal: return home with family to assist PT Goal Formulation: With patient Time For Goal Achievement: 01/18/19 Potential to Achieve Goals: Good Progress towards PT goals: Progressing toward goals    Frequency    Min 3X/week      PT Plan Current plan remains appropriate    Co-evaluation              AM-PAC PT "6 Clicks" Mobility   Outcome Measure  Help needed turning from your back to your side while in a flat bed without using bedrails?: None Help needed moving from lying on your back to sitting on the side of a flat bed without using bedrails?: A Little Help needed moving to and from a bed to a chair (including a wheelchair)?: A Little Help needed standing up from a chair using your arms (e.g., wheelchair or bedside chair)?: A  Little Help needed to walk in hospital room?: A Little Help needed climbing 3-5 steps with a railing? : A Lot 6 Click Score: 18    End of Session Equipment Utilized During Treatment: Oxygen Activity Tolerance: Patient tolerated treatment well;Patient limited by fatigue Patient left: in chair;with call bell/phone within reach;with chair alarm set Nurse Communication: Mobility status PT Visit Diagnosis: Unsteadiness on feet (R26.81);Other abnormalities of gait and mobility (R26.89);Muscle weakness (generalized) (M62.81)     Time: [id number] PT Time Calculation (min) (ACUTE ONLY): 29 min  Charges:  $Gait Training: 8-22 mins $Therapeutic Exercise: 8-22 mins                     2:48 PM, 01/05/19 Lonell Grandchild, MPT Physical Therapist with [facility] 336 [phone] office [phone] mobile phone

## 2019-01-06 DIAGNOSIS — I483 Typical atrial flutter: Secondary | ICD-10-CM

## 2019-01-06 LAB — BASIC METABOLIC PANEL
Anion gap: 12 (ref 5–15)
BUN: 34 mg/dL — ABNORMAL HIGH (ref 8–23)
CO2: 33 mmol/L — ABNORMAL HIGH (ref 22–32)
Calcium: 8.8 mg/dL — ABNORMAL LOW (ref 8.9–10.3)
Chloride: 88 mmol/L — ABNORMAL LOW (ref 98–111)
Creatinine, Ser: 0.86 mg/dL (ref 0.61–1.24)
GFR calc Af Amer: >60 mL/min (ref >60)
GFR calc non Af Amer: >60 mL/min (ref >60)
Glucose, Bld: 138 mg/dL — ABNORMAL HIGH (ref 70–99)
Potassium: 3.9 mmol/L (ref 3.5–5.1)
Sodium: 133 mmol/L — ABNORMAL LOW (ref 135–145)

## 2019-01-06 MED ORDER — PREDNISONE 20 MG PO TABS
60.0000 mg | ORAL_TABLET | Freq: Every day | ORAL | Status: DC
  Administered 2019-01-07 – 2019-01-08 (×2): 60 mg via ORAL
  Filled 2019-01-06 (×2): qty 3

## 2019-01-06 MED ORDER — IPRATROPIUM-ALBUTEROL 0.5-2.5 (3) MG/3ML IN SOLN
3.0000 mL | Freq: Three times a day (TID) | RESPIRATORY_TRACT | Status: DC
  Administered 2019-01-07 – 2019-01-11 (×14): 3 mL via RESPIRATORY_TRACT
  Filled 2019-01-06 (×14): qty 3

## 2019-01-06 NOTE — Progress Notes (Addendum)
Progress Note  Patient Name: RAYDON KRITZMAN Date of Encounter: 01/06/2019  Primary Cardiologist: Kate Sable, MD  Subjective   Reports improvement in his breathing. No chest pain or palpitations. PT recommended SNF at discharge and the patient is angry about this. Says he refuses to go to rehab.   Inpatient Medications    Scheduled Meds: . amitriptyline  100 mg Oral QHS  . aspirin EC  81 mg Oral Daily  . atorvastatin  40 mg Oral q1800  . budesonide (PULMICORT) nebulizer solution  0.5 mg Nebulization BID  . Chlorhexidine Gluconate Cloth  6 each Topical Daily  . finasteride  5 mg Oral q morning - 10a  . furosemide  40 mg Intravenous BID  . heparin  5,000 Units Subcutaneous Q8H  . ipratropium-albuterol  3 mL Nebulization Q6H  . mouth rinse  15 mL Mouth Rinse BID  . methylPREDNISolone (SOLU-MEDROL) injection  60 mg Intravenous Q12H  . metolazone  2.5 mg Oral Daily  . metoprolol tartrate  12.5 mg Oral BID  . tamsulosin  0.4 mg Oral QHS   Continuous Infusions:  PRN Meds: acetaminophen **OR** acetaminophen, ALPRAZolam, bisacodyl, bisacodyl, ipratropium-albuterol, nitroGLYCERIN, ondansetron **OR** ondansetron (ZOFRAN) IV, traMADol   Vital Signs    Vitals:   01/06/19 0156 01/06/19 0505 01/06/19 0914 01/06/19 0920  BP:  (!) 143/86    Pulse:  83    Resp:  20    Temp:  97.6 F (36.4 C)    TempSrc:  Oral    SpO2: 97% 96% 98% 100%  Weight:  93.2 kg    Height:        Intake/Output Summary (Last 24 hours) at 01/06/2019 1041 Last data filed at 01/06/2019 0900 Gross per 24 hour  Intake 960 ml  Output 3400 ml  Net -2440 ml    Last 3 Weights 01/06/2019 01/05/2019 01/02/2019  Weight (lbs) 205 lb 7.5 oz 212 lb 1.3 oz 210 lb 15.7 oz  Weight (kg) 93.2 kg 96.2 kg 95.7 kg      Telemetry    Atrial flutter, HR in the 80's. - Personally Reviewed  ECG    No new tracings.   Physical Exam   General: Well developed, well nourished, male appearing in no acute distress. Head:  Normocephalic, atraumatic.  Neck: Supple without bruits, JVD not elevated. Lungs:  Resp regular and unlabored, rhonchi along upper lung fields bilaterally. Heart: Irregularly irregular, S1, S2, no S3, S4, or murmur; no rub. Abdomen: Soft, non-tender, non-distended with normoactive bowel sounds. No hepatomegaly. No rebound/guarding. No obvious abdominal masses. Extremities: No clubbing or cyanosis, 1+ pitting edema. Distal pedal pulses are 2+ bilaterally. Neuro: Alert and oriented X 3. Moves all extremities spontaneously. Psych: Normal affect.  Labs    Chemistry Recent Labs  Lab 01/01/19 2235  01/02/19 0957  01/04/19 0525 01/05/19 0649 01/06/19 0808  NA 142   < > 143   < > 137 134* 133*  K 5.8*   < > 4.7   < > 4.1 4.1 3.9  CL 100   < > 95*   < > 90* 93* 88*  CO2 35*   < > 35*   < > 36* 36* 33*  GLUCOSE 100*   < > 132*   < > 150* 129* 138*  BUN 46*   < > 45*   < > 45* 37* 34*  CREATININE 1.88*   < > 1.52*   < > 1.11 0.98 0.86  CALCIUM 8.7*   < >  9.0   < > 8.5* 8.5* 8.8*  PROT 6.6  --  6.8  --   --   --   --   ALBUMIN 3.1*  --  3.3*  --   --   --   --   AST 28  --  26  --   --   --   --   ALT 32  --  36  --   --   --   --   ALKPHOS 72  --  80  --   --   --   --   BILITOT 0.6  --  0.9  --   --   --   --   GFRNONAA 34*   < > 44*   < > >60 >60 >60  GFRAA 40*   < > 52*   < > >60 >60 >60  ANIONGAP 7  --  13   < > 11 5 12    < > = values in this interval not displayed.     Hematology Recent Labs  Lab 01/01/19 2327 01/02/19 0606 01/02/19 0957  WBC 8.2  --  5.3  RBC 4.94  --  5.12  HGB 14.2  --  14.5  HCT 51.1 49.7 51.8  MCV 103.4*  --  101.2*  MCH 28.7  --  28.3  MCHC 27.8*  --  28.0*  RDW 20.8*  --  20.3*  PLT 216  --  219    Cardiac EnzymesNo results for input(s): TROPONINI in the last 168 hours. No results for input(s): TROPIPOC in the last 168 hours.   BNP Recent Labs  Lab 01/01/19 2327  BNP 885.0*     DDimer No results for input(s): DDIMER in the last 168  hours.   Radiology    No results found.  Cardiac Studies   Echocardiogram: 01/02/2019 IMPRESSIONS   1. The left ventricle has normal systolic function, with an ejection fraction of 55-60%. The cavity size was normal. There is mildly increased left ventricular wall thickness. Left ventricular diastolic Doppler parameters are indeterminate.  2. The right ventricle has normal systolic function. The cavity was normal. There is no increase in right ventricular wall thickness. Right ventricular systolic pressure could not be assessed.  3. No evidence of mitral valve stenosis.  4. The aortic valve is tricuspid. Mild thickening of the aortic valve. Mild calcification of the aortic valve. No stenosis of the aortic valve.  5. The aorta is normal unless otherwise noted.  6. The ascending aorta is normal in size and structure.   Patient Profile     73 y.o. 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 Cardiology consulted due to new-onset atrial flutter.   Assessment & Plan    1. New-onset Atrial Flutter - New diagnosis for the patient this admission and he is overall unaware of his arrhythmia. Rates remain stable in the 80's. Will continue with Lopressor at current dosing. - He has been started on Eliquis 5 mg twice daily with ASA being discontinued. Would consider DCCV in 3 to 4 weeks if he has not converted back to normal sinus rhythm. Tried to discuss possible cardioversion with the patient today but he is mostly  focused on if he will have to go to rehab at discharge. Would again address at follow-up.  2. Acute on Chronic Diastolic CHF Exacerbation - Presented with worsening dyspnea on exertion, orthopnea, and lower  extremity edema with BNP elevated to 885 and CXR showing small bilateral effusions. Echo shows a preserved EF of 55 to 60%.  He has responded well to IV Lasix and is overall -6.8 L thus far. Received PO Metolazone 2.5mg  yesterday by the admitting team and follow-up labs remain stable with creatinine at 0.86 and K+ 3.9. Weight down to 205 lbs (patient reported previous baseline of 215 lbs so will need to establish new dry weight). Given volume overload, continue with IV Lasix and scheduled Metolazone today.   3. CAD  - s/p cath in 2015 showing occluded RCA and high-grade LCx stenosis treated with DESx2 to LCx.   - Cardiac enzymes have been negative this admission and echocardiogram shows a preserved EF with no regional wall motion abnormalities. No plans for further ischemic testing at this time. ASA has been discontinued given the need for anticoagulation. Continue statin and beta-blocker therapy  4. AVNRT  - s/p RFCA by Dr. Lovena Le in 2015. No recurrent SVT by telemetry review. Remains on Lopressor 12.5 mg twice daily.  5. HLD - He has been continued on PTA Atorvastatin.  6. COPD - He reports improvement in his respiratory status since admission. Currently on 4 L nasal cannula with saturations in the 90's. Further treatment per admitting team.   For questions or updates, please contact [place] Please consult [email or web address] for contact info under Cardiology/STEMI.   Arna Medici , PA-C 10:41 AM 01/06/2019 Pager: [phone]   Attending note:  I reviewed the cardiology consultation by Dr. Bronson Ing from yesterday.  I agree with above assessment by Ms. Strader PA-C.  Patient remains asymptomatic in terms of palpitations with newly documented atrial flutter, and heart rates are reasonably well controlled on Lopressor.  He is tolerating Eliquis which was recently started following discontinuation of aspirin.  Echocardiogram reveals normal LVEF at 55 to 60%.  He  continues to diurese on IV Lasix with associated acute on chronic diastolic heart failure.  No obvious active angina at this time or evidence of ACS.  He is afebrile, heart rate is in the 80s in rate controlled atrial flutter by telemetry which I personally reviewed.  Heart rate is irregular, lungs exhibit decreased breath sounds at the bases.  Creatinine 0.86 with potassium 3.9.  Continue Lopressor along with Eliquis with plan for outpatient cardioversion in the next 3 to 4 weeks.  We will continue IV diuresis given clinical improvement and stable renal function.  Satira Sark, M.D., F.A.C.C.

## 2019-01-06 NOTE — Progress Notes (Signed)
Physical Therapy Treatment Patient Details Name: Curtis [name] MRN: [id number] DOB: November 18, 1945 Today's Date: 01/06/2019    History of Present Illness Curtis [name]  is a 73 y.o. male, with history of pulmonary asbestosis, COPD, peripheral vascular disease, coronary artery disease status post MI in 2015, IBS, hypertension, hyperlipidemia, glaucoma, GERD, diverticulosis, history of AAA presents to the ED with son for altered mental status.  Unfortunately son is a longer at bed side at time of my exam.  History is collected from medical record and from ER staff.  Apparently patient had just been discharged from the hospital within the last 2 weeks.  At that time he had community-acquired pneumonia and acute hypoxic and hypercarbic respiratory failure.  He was discharged with instructions to follow-up with his primary care physician in 1 week.  He was continued on 4 L nasal cannula at home with up to 8 L for ambulation.  He did a steroid taper, continued Augmentin, was prescribed Symbicort, and received a rolling walker.  Son reported to the ED staff the patient was at first doing better but not quite to his baseline.  He started complaining of shortness of breath shortly after that discharge.  Then 2 days ago he started having confusion at home.  Son brought father in unfortunately found that he was in hypoxic hypercarbic respiratory failure once again.  He also had a chest x-ray that showed congestion.  His BNP was in the 800s.  Last one on file was in the 70s.  Patient's last echo was in 2015.  So patient is being admitted for acute hypoxic and hypercarbic respiratory failure secondary to COPD exacerbation, and CHF as well.    PT Comments    Pt supine in bed and willing to participate with therapy.  Pt with delayed response to questions though answered appropriately.  Mod I with bed mobility, increased time and labored movements.  Increased difficulty with sit to stands, especially from lower surfaces  including toilet, increased assistance required due to weakness.  Min guard with gait, slow labored movements, no LOB episodes.  EOS pt left in chair with call bell within reach.  Monitored O2 saturation through session, pt at 97% on 4L via nasal cannula.  Pt was limited by fatigue with tasks.       Follow Up Recommendations  SNF;Supervision for mobility/OOB;Supervision - Intermittent     Equipment Recommendations  None recommended by PT    Recommendations for Other Services       Precautions / Restrictions Precautions Precautions: Fall Precaution Comments: Home O2 dependent Restrictions Weight Bearing Restrictions: No    Mobility  Bed Mobility Overal bed mobility: Needs Assistance Bed Mobility: Supine to Sit     Supine to sit: Min guard     General bed mobility comments: slow labored movement requring a little assist to pull self up to sitting  Transfers Overall transfer level: Needs assistance Equipment used: Rolling walker (2 wheeled) Transfers: Sit to/from Stand Sit to Stand: Min guard         General transfer comment: elevated bed height for assistance.  Cueing for proper hand placement for safety and assistance.  Slow labored movements  Ambulation/Gait Ambulation/Gait assistance: Supervision;Min guard Gait Distance (Feet): 50 Feet(Multiple trips to restroom, back to chair to catch breath then through doorway and back to chair) Assistive device: Rolling walker (2 wheeled) Gait Pattern/deviations: Decreased step length - right;Decreased step length - left;Decreased stride length Gait velocity: decreased   General Gait Details: increased endurance/distance while  on 4 LPM with SpO2 between 89-97%, slightly labored cadence without loss of balance   Stairs             Wheelchair Mobility    Modified Rankin (Stroke Patients Only)       Balance                                            Cognition Arousal/Alertness:  Awake/alert Behavior During Therapy: WFL for tasks assessed/performed;Anxious(Pt reports he was nervous in his belly multiple times through session) Overall Cognitive Status: Within Functional Limits for tasks assessed                                 General Comments: slow to answer some questions      Exercises      General Comments        Pertinent Vitals/Pain Faces Pain Scale: Hurts a little bit Pain Location: neuropathy pain BLE Pain Descriptors / Indicators: Constant;Discomfort;Sore Pain Intervention(s): Monitored during session;Limited activity within patient's tolerance    Home Living                      Prior Function            PT Goals (current goals can now be found in the care plan section)      Frequency    Min 3X/week      PT Plan Current plan remains appropriate    Co-evaluation              AM-PAC PT "6 Clicks" Mobility   Outcome Measure  Help needed turning from your back to your side while in a flat bed without using bedrails?: None Help needed moving from lying on your back to sitting on the side of a flat bed without using bedrails?: A Little Help needed moving to and from a bed to a chair (including a wheelchair)?: A Little Help needed standing up from a chair using your arms (e.g., wheelchair or bedside chair)?: A Little Help needed to walk in hospital room?: A Little Help needed climbing 3-5 steps with a railing? : A Lot 6 Click Score: 18    End of Session Equipment Utilized During Treatment: Gait belt;Oxygen Activity Tolerance: Patient tolerated treatment well;Patient limited by fatigue Patient left: in chair;with call bell/phone within reach;with chair alarm set Nurse Communication: Mobility status PT Visit Diagnosis: Unsteadiness on feet (R26.81);Other abnormalities of gait and mobility (R26.89);Muscle weakness (generalized) (M62.81)     Time: [id number] PT Time Calculation (min) (ACUTE ONLY): 46  min  Charges:  $Therapeutic Activity: 23-37 mins                     [address] LPTA; CBIS [phone]   Aldona Lento 01/06/2019, 5:25 PM

## 2019-01-06 NOTE — Progress Notes (Signed)
Foley catheter removed on previous shift. Patient unable to void on his on and complaining of discomfort.  Bladder scan reading greater than 999. Mid level notified. I&O cath done, output of 1100 ml's of clear, yellow urine. Patient resting in bed at this time. Denies any pain or discomfort.

## 2019-01-06 NOTE — Progress Notes (Signed)
PROGRESS NOTE  Curtis [name] [id number] DOB: 06-01-1945 DOA: 01/01/2019 PCP: [name], MD  Brief History: 73 year old male with a history of pulmonary asbestosis, COPD, coronary artery disease, hypertension, hyperlipidemia, SVT status post ablation presenting with altered mental status. The patient was recently admitted to the hospital from 12/15/2018 through 12/19/2018 for sepsis secondary to pneumonia, COPD exacerbation and acute on chronic respiratory failure. He was discharged home with home health physical therapy,to finish Augmentin and prednisone taper. The patient's son states that the patient has had a generalized weakness for about a week with some confusion. This has worsened over the past 3 days to the point where the patient had not been taking any of his medications. On the day of admission, the patient was somnolent and difficult to arouse. EMS was activated, and the patient was noted to be somewhat cyanotic on his lips but arousable at that time. In the emergency room, the patient was placed on BiPAP. Chest x-ray showed vascular congestion. He was started on steroids and intravenous furosemide. The patient was afebrile hemodynamically stable with ABG initially showing 7.173/111/74 on 35%. He was placed on BiPAP. BNP was 885. Troponin was 20>>>21. Lactic acid was 1.0.  Assessment/Plan: Acute on chronic respiratory failure with hypoxia and hypercarbia -Secondary to pulmonary edema and COPD exacerbation -Personally reviewed chest x-ray--bilateral pleural plaques. Bilateral pleural effusion, right greater than left. Increased interstitial markings -Weaned off BiPAP>> currently using 3-4 L which is essentially his baseline supplementation. -Still complaining of shortness of breath.  COPD exacerbation -Continue Pulmicort -Continue  steroids, but transition to oral prednisone. -Continue duo nebs -Continue oxygen supplementation  Acute diastolic  CHF -Continue lasix to 40 mg IV bid -provided education to patient regarding diet and fluid intake -Echocardiogram--EF 55-60%, normal RV, indeterminant diastolic  -Daily weights and Strict I's and O's -Will give 1 more dose of metolazone and follow urine output.  Dysrhythmia/SVT/Aflutter -personally reviewed EKG--consistent with aflutter -consult cardiology in setting of prior SVT with ablation and CHF -continue metoprolol and following cardiology recommendations we will start the use of Eliquis. -Follow further recommendations. -Continue to monitor on telemetry -Follow electrolytes and replete as needed.  Urinary retention -Placed Foley catheter 8/30 -Failed voiding trial 9/1 and Foley catheter was been replaced -Second attempt for Foley discontinuation on 01/06/2019 -Continue Proscar and Flomax.  Hyperkalemia -Patient received temporizing measures -Improved/resolved with IV diuresis  Acute kidney injury -Baseline creatinine 0.9-1.2 -Presented with serum creatinine 1.8 -Continue to follow renal function trend with acute diuresis. -Essentially back to baseline At this time  Coronary artery disease -No chest pain presently -Personally reviewed EKG--sinus rhythm with nonconducted atrial beats, nonspecific ST changes -Troponins not consistent with ACS  Anxiety/depression -Continue Xanax and Elavil -PMP AWARE reviewed--90 day supply of xanax 2 mg bid refilled in August 2020  Essential hypertension -Continue holding amlodipine and monitor clinically -Continue metoprolol tartrate -Heart healthy diet encouraged.  Frequent Falls -Following recommendations by PT eval; patient will need a skilled nursing facility for further care and rehabilitation. -[id number] -folic acid -[id number]  Lower extremity edema, recurrent left -Venous duplex ruleoutDVT--neg -Swelling secondary to acute on chronic CHF.  Left Heel Pressure Injury -Pursuit off load pressure -Continue  local wound care and preventive measurement.  Disposition Plan:Homein 1-2days   Family Communication:Son updated over the phone.  Consultants:cardiology  Code Status:DNI  DVT Prophylaxis: [place] Heparin    Procedures: As Listed in Progress Note Above  Antibiotics: None   Subjective: Patient  reports breathing continue improving; denies chest pain, no nausea, no vomiting, no fever.  Still with some fluids upper lobe appreciated on exam and expressing shortness of breath on exertion.  He is weak and deconditioned.   Objective: Vitals:   01/06/19 0914 01/06/19 0920 01/06/19 1150 01/06/19 1441  BP:   (!) 153/79   Pulse:   85   Resp:      Temp:      TempSrc:      SpO2: 98% 100%  96%  Weight:      Height:        Intake/Output Summary (Last 24 hours) at 01/06/2019 1646 Last data filed at 01/06/2019 0900 Gross per 24 hour  Intake 720 ml  Output 3400 ml  Net -2680 ml   Weight change: -3 kg Exam: General exam: Alert, awake, oriented x 3, reports increased urine output and improvement in his breathing.  Denies chest pain, no nausea, no vomiting.  Still with shortness of breath on exertion, weak and deconditioned. Respiratory system: Decreased breath sounds at the bases, no frank crackles, no wheezing, positive rhonchi.  No using accessory muscles and [place] 4 L nasal cannula supplementation as he chronically does with good oxygen saturation. Cardiovascular system:Rate controlled.  No rubs, no gallops. Gastrointestinal system: Abdomen is nondistended, soft and nontender. No organomegaly or masses felt. Normal bowel sounds heard. Central nervous system: Alert and oriented. No focal neurological deficits. Extremities: No 1+ edema bilaterally, no cyanosis, no clubbing. Skin: No petechiae, no open wounds.  Pressure injury on his left heel appreciated on exam (present on admission). Psychiatry: Judgement and insight appear normal. Mood & affect appropriate.    Data  Reviewed: I have personally reviewed following labs and imaging studies  Basic Metabolic Panel: Recent Labs  Lab 01/02/19 0957  01/03/19 0425 01/03/19 0823 01/03/19 1155 01/04/19 0525 01/05/19 0649 01/06/19 0808  NA 143  --  139  --   --  137 134* 133*  K 4.7   < > 4.6 4.7 4.9 4.1 4.1 3.9  CL 95*  --  94*  --   --  90* 93* 88*  CO2 35*  --  35*  --   --  36* 36* 33*  GLUCOSE 132*  --  144*  --   --  150* 129* 138*  BUN 45*  --  45*  --   --  45* 37* 34*  CREATININE 1.52*  --  1.32*  --   --  1.11 0.98 0.86  CALCIUM 9.0  --  8.6*  --   --  8.5* 8.5* 8.8*  MG 2.0  --  1.9  --   --  1.9 1.9  --    < > = values in this interval not displayed.   Liver Function Tests: Recent Labs  Lab 01/01/19 2235 01/02/19 0957  AST 28 26  ALT 32 36  ALKPHOS 72 80  BILITOT 0.6 0.9  PROT 6.6 6.8  ALBUMIN 3.1* 3.3*   CBC: Recent Labs  Lab 01/01/19 2327 01/02/19 0606 01/02/19 0957  WBC 8.2  --  5.3  NEUTROABS 6.6  --   --   HGB 14.2  --  14.5  HCT 51.1 49.7 51.8  MCV 103.4*  --  101.2*  PLT 216  --  219   Urine analysis:    Component Value Date/Time   COLORURINE AMBER (A) 01/02/2019 0046   APPEARANCEUR HAZY (A) 01/02/2019 0046   LABSPEC 1.026 01/02/2019 0046   PHURINE 5.0  01/02/2019 [place] 01/02/2019 0046   HGBUR NEGATIVE 01/02/2019 0046   BILIRUBINUR NEGATIVE 01/02/2019 [place] 01/02/2019 0046   PROTEINUR 100 (A) 01/02/2019 0046   UROBILINOGEN 0.2 02/12/2015 1859   NITRITE NEGATIVE 01/02/2019 0046   LEUKOCYTESUR NEGATIVE 01/02/2019 0046    Recent Results (from the past 240 hour(s))  SARS Coronavirus 2 [facility] order, Performed in [facility] hospital lab) Nasopharyngeal Nasopharyngeal Swab     Status: None   Collection Time: 01/01/19 10:21 PM   Specimen: Nasopharyngeal Swab  Result Value Ref Range Status   SARS Coronavirus 2 NEGATIVE NEGATIVE Final    Comment: (NOTE) If result is NEGATIVE SARS-CoV-2 target nucleic acids are NOT  DETECTED. The SARS-CoV-2 RNA is generally detectable in upper and lower  respiratory specimens during the acute phase of infection. The lowest  concentration of SARS-CoV-2 viral copies this assay can detect is 250  copies / mL. A negative result does not preclude SARS-CoV-2 infection  and should not be used as the sole basis for treatment or other  patient management decisions.  A negative result may occur with  improper specimen collection / handling, submission of specimen other  than nasopharyngeal swab, presence of viral mutation(s) within the  areas targeted by this assay, and inadequate number of viral copies  (<250 copies / mL). A negative result must be combined with clinical  observations, patient history, and epidemiological information. If result is POSITIVE SARS-CoV-2 target nucleic acids are DETECTED. The SARS-CoV-2 RNA is generally detectable in upper and lower  respiratory specimens dur ing the acute phase of infection.  Positive  results are indicative of active infection with SARS-CoV-2.  Clinical  correlation with patient history and other diagnostic information is  necessary to determine patient infection status.  Positive results do  not rule out bacterial infection or co-infection with other viruses. If result is PRESUMPTIVE POSTIVE SARS-CoV-2 nucleic acids MAY BE PRESENT.   A presumptive positive result was obtained on the submitted specimen  and confirmed on repeat testing.  While 2019 novel coronavirus  (SARS-CoV-2) nucleic acids may be present in the submitted sample  additional confirmatory testing may be necessary for epidemiological  and / or clinical management purposes  to differentiate between  SARS-CoV-2 and other Sarbecovirus currently known to infect humans.  If clinically indicated additional testing with an alternate test  methodology [phone]) is advised. The SARS-CoV-2 RNA is generally  detectable in upper and lower respiratory sp ecimens during  the acute  phase of infection. The expected result is Negative. Fact Sheet for Patients:  StrictlyIdeas.no Fact Sheet for Healthcare Providers: BankingDealers.co.za This test is not yet approved or cleared by the [place] FDA and has been authorized for detection and/or diagnosis of SARS-CoV-2 by FDA under an Emergency Use Authorization (EUA).  This EUA will remain in effect (meaning this test can be used) for the duration of the COVID-19 declaration under Section 564(b)(1) of the Act, 21 U.S.C. section 360bbb-3(b)(1), unless the authorization is terminated or revoked sooner. Performed at [facility], [address], [place], [place] [address]   Urine culture     Status: Abnormal   Collection Time: 01/02/19 12:46 AM   Specimen: Urine, Catheterized  Result Value Ref Range Status   Specimen Description   Final    URINE, CATHETERIZED Performed at [facility], [address], [place], [place] [address]    Special Requests   Final    Normal Performed at [facility], [address], [place], [place] [address]    Culture (A)  Final    >=100,000 COLONIES/mL STAPHYLOCOCCUS SPECIES (COAGULASE NEGATIVE)   Report Status 01/04/2019 FINAL  Final   Organism ID, Bacteria STAPHYLOCOCCUS SPECIES (COAGULASE NEGATIVE) (A)  Final      Susceptibility   Staphylococcus species (coagulase negative) - MIC*    CIPROFLOXACIN >=8 RESISTANT Resistant     GENTAMICIN <=0.5 SENSITIVE Sensitive     NITROFURANTOIN <=16 SENSITIVE Sensitive     OXACILLIN >=4 RESISTANT Resistant     TETRACYCLINE 2 SENSITIVE Sensitive     VANCOMYCIN 2 SENSITIVE Sensitive     TRIMETH/SULFA <=10 SENSITIVE Sensitive     CLINDAMYCIN <=0.25 SENSITIVE Sensitive     RIFAMPIN <=0.5 SENSITIVE Sensitive     Inducible Clindamycin NEGATIVE Sensitive     * >=100,000 COLONIES/mL STAPHYLOCOCCUS SPECIES (COAGULASE NEGATIVE)  MRSA PCR Screening     Status: None   Collection Time: 01/02/19   8:35 AM   Specimen: Nasopharyngeal  Result Value Ref Range Status   MRSA by PCR NEGATIVE NEGATIVE Final    Comment:        The GeneXpert MRSA Assay (FDA approved for NASAL specimens only), is one component of a comprehensive MRSA colonization surveillance program. It is not intended to diagnose MRSA infection nor to guide or monitor treatment for MRSA infections. Performed at [facility], [address], [place], [place] [address]   Culture, sputum-assessment     Status: None   Collection Time: 01/02/19  8:39 AM   Specimen: Sputum  Result Value Ref Range Status   Specimen Description SPUTUM  Final   Special Requests NONE  Final   Sputum evaluation   Final    Sputum specimen not acceptable for testing.  Please recollect.   NOTIFIED NURSE DAVIS AT 1300 ON 01/02/2019 BY EVA Performed at [facility], [address], [place], [place] [address]    Report Status 01/02/2019 FINAL  Final     Scheduled Meds:  amitriptyline  100 mg Oral QHS   aspirin EC  81 mg Oral Daily   atorvastatin  40 mg Oral q1800   budesonide (PULMICORT) nebulizer solution  0.5 mg Nebulization BID   Chlorhexidine Gluconate Cloth  6 each Topical Daily   finasteride  5 mg Oral q morning - 10a   furosemide  40 mg Intravenous BID   heparin  5,000 Units Subcutaneous Q8H   ipratropium-albuterol  3 mL Nebulization Q6H   mouth rinse  15 mL Mouth Rinse BID   methylPREDNISolone (SOLU-MEDROL) injection  60 mg Intravenous Q12H   metoprolol tartrate  12.5 mg Oral BID   tamsulosin  0.4 mg Oral QHS   Procedures/Studies: US Venous Img Lower Bilateral  Result Date: 01/02/2019 CLINICAL DATA:  73 year old male with lower extremity edema EXAM: BILATERAL LOWER EXTREMITY VENOUS DOPPLER ULTRASOUND TECHNIQUE: Gray-scale sonography with graded compression, as well as color Doppler and duplex ultrasound were performed to evaluate the lower extremity deep venous systems from the level of the common femoral vein and  including the common femoral, femoral, profunda femoral, popliteal and calf veins including the posterior tibial, peroneal and gastrocnemius veins when visible. The superficial great saphenous vein was also interrogated. Spectral Doppler was utilized to evaluate flow at rest and with distal augmentation maneuvers in the common femoral, femoral and popliteal veins. COMPARISON:  None. FINDINGS: RIGHT LOWER EXTREMITY Common Femoral Vein: No evidence of thrombus. Normal compressibility, respiratory phasicity and response to augmentation. Increased pulsatility of the venous waveforms. Saphenofemoral Junction: No  evidence of thrombus. Normal compressibility and flow on color Doppler imaging. Profunda Femoral Vein: No evidence of thrombus. Normal compressibility and flow on color Doppler imaging. Femoral Vein: No evidence of thrombus. Normal compressibility, respiratory phasicity and response to augmentation. Popliteal Vein: No evidence of thrombus. Normal compressibility, respiratory phasicity and response to augmentation. Calf Veins: No evidence of thrombus. Normal compressibility and flow on color Doppler imaging. Superficial Great Saphenous Vein: No evidence of thrombus. Normal compressibility. Venous Reflux:  None. Other Findings:  None. LEFT LOWER EXTREMITY Common Femoral Vein: No evidence of thrombus. Normal compressibility, respiratory phasicity and response to augmentation. Increased pulsatility of the venous waveforms. Saphenofemoral Junction: No evidence of thrombus. Normal compressibility and flow on color Doppler imaging. Profunda Femoral Vein: No evidence of thrombus. Normal compressibility and flow on color Doppler imaging. Femoral Vein: No evidence of thrombus. Normal compressibility, respiratory phasicity and response to augmentation. Popliteal Vein: No evidence of thrombus. Normal compressibility, respiratory phasicity and response to augmentation. Calf Veins: No evidence of thrombus. Normal  compressibility and flow on color Doppler imaging. Superficial Great Saphenous Vein: No evidence of thrombus. Normal compressibility. Venous Reflux:  None. Other Findings:  None. IMPRESSION: 1. No evidence of deep venous thrombosis in either lower extremity. 2. Increased pulsatility of the venous waveforms suggests elevated right heart pressures. Findings can be seen in tricuspid regurgitation, right heart failure, pulmonary hypertension and COPD. Electronically Signed   By: Jacqulynn Cadet M.D.   On: 01/02/2019 14:30   Portable Chest 1 View  Result Date: 01/03/2019 CLINICAL DATA:  Shortness of breath and altered mental status. Congestive heart failure. EXAM: PORTABLE CHEST 1 VIEW COMPARISON:  01/01/2019 and 08/20/2017 FINDINGS: Stable cardiomegaly and pulmonary vascular congestion. Aortic atherosclerosis. Improved aeration of both lungs is seen. Bilateral pleural-parenchymal scarring remains stable compared to 2019. No evidence of acute infiltrate or pleural effusion. IMPRESSION: Stable cardiomegaly, pulmonary vascular congestion, and bilateral pleural-parenchymal scarring. No acute findings. Electronically Signed   By: Marlaine Hind M.D.   On: 01/03/2019 05:19   Dg Chest Port 1 View  Result Date: 01/01/2019 CLINICAL DATA:  73 year old male with shortness of breath EXAM: PORTABLE CHEST 1 VIEW COMPARISON:  Chest radiograph dated 12/15/2018 FINDINGS: There is cardiomegaly with vascular congestion. There is shallow inspiration. Small bilateral pleural effusions and bibasilar densities similar to prior radiograph which may represent atelectasis or infiltrate. No pneumothorax. Stable cardiomegaly. No acute osseous pathology. IMPRESSION: 1. Cardiomegaly with vascular congestion. 2. Small bilateral pleural effusions and bibasilar densities similar to prior radiograph. Electronically Signed   By: Anner Crete M.D.   On: 01/01/2019 23:20   Dg Chest Port 1 View  Result Date: 12/15/2018 CLINICAL DATA:  Fever,  weakness EXAM: PORTABLE CHEST 1 VIEW COMPARISON:  08/20/2017 chest radiograph. FINDINGS: Low lung volumes. Stable cardiomediastinal silhouette with moderate cardiomegaly. No pneumothorax. Calcified bilateral pleural plaques and pleural thickening, unchanged. No convincing pleural effusion. Patchy consolidation at both lung bases superimposed on chronic reticular opacities. No overt pulmonary edema. IMPRESSION: 1. Patchy consolidation superimposed on chronic reticular opacities at the lung bases, cannot exclude aspiration or pneumonia. Chest radiograph follow-up advised. 2. 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 3. Moderate cardiomegaly without overt pulmonary edema. Electronically Signed   By: Ilona Sorrel M.D.   On: 12/15/2018 18:08   Dg Knee Complete 4 Views Right  Result Date: 12/15/2018 CLINICAL DATA:  Fall, right knee pain when moving EXAM: RIGHT KNEE - COMPLETE 4+ VIEW COMPARISON:  None. FINDINGS: No fracture or dislocation.  Small suprapatellar right knee joint effusion. No suspicious focal osseous lesion. Small superior right patellar enthesophyte. No significant degenerative arthropathy. Posterior vascular calcifications. No radiopaque foreign body. IMPRESSION: Small suprapatellar right knee joint effusion, with no right knee fracture or dislocation. Electronically Signed   By: Ilona Sorrel M.D.   On: 12/15/2018 18:05    Barton Dubois, MD  [facility] Pager [phone]  01/06/2019, 4:46 PM   LOS: 4 days

## 2019-01-06 NOTE — Progress Notes (Signed)
Patient states he is not ready to be placed on BIPAP at this time. Patient states he will have RN contact RT when ready.

## 2019-01-07 LAB — BASIC METABOLIC PANEL
Anion gap: 13 (ref 5–15)
BUN: 35 mg/dL — ABNORMAL HIGH (ref 8–23)
CO2: 39 mmol/L — ABNORMAL HIGH (ref 22–32)
Calcium: 9 mg/dL (ref 8.9–10.3)
Chloride: 81 mmol/L — ABNORMAL LOW (ref 98–111)
Creatinine, Ser: 0.99 mg/dL (ref 0.61–1.24)
GFR calc Af Amer: >60 mL/min (ref >60)
GFR calc non Af Amer: >60 mL/min (ref >60)
Glucose, Bld: 77 mg/dL (ref 70–99)
Potassium: 3.6 mmol/L (ref 3.5–5.1)
Sodium: 133 mmol/L — ABNORMAL LOW (ref 135–145)

## 2019-01-07 MED ORDER — POTASSIUM CHLORIDE ER 10 MEQ PO TBCR: 10.0000 meq | EXTENDED_RELEASE_TABLET | Freq: Every day | ORAL | 1 refills | Status: DC

## 2019-01-07 MED ORDER — FUROSEMIDE 20 MG PO TABS: 40.0000 mg | ORAL_TABLET | Freq: Every day | ORAL | 11 refills | Status: DC

## 2019-01-07 MED ORDER — PREDNISONE 20 MG PO TABS: ORAL_TABLET | ORAL | 0 refills | Status: DC

## 2019-01-07 NOTE — Progress Notes (Addendum)
Progress Note  Patient Name: Curtis [name] Date of Encounter: 01/07/2019  Primary Cardiologist: Kate Sable, MD (previously followed by Dr. Aundra Dubin but wishes to follow-up in the [facility] office)  Subjective   Breathing improved and back to baseline. No chest pain or palpitations overnight or this AM. Has been unable to void and is having bladder scans and I&O cath's performed.   Inpatient Medications    Scheduled Meds: . amitriptyline  100 mg Oral QHS  . aspirin EC  81 mg Oral Daily  . atorvastatin  40 mg Oral q1800  . budesonide (PULMICORT) nebulizer solution  0.5 mg Nebulization BID  . Chlorhexidine Gluconate Cloth  6 each Topical Daily  . finasteride  5 mg Oral q morning - 10a  . furosemide  40 mg Intravenous BID  . heparin  5,000 Units Subcutaneous Q8H  . ipratropium-albuterol  3 mL Nebulization TID  . mouth rinse  15 mL Mouth Rinse BID  . metoprolol tartrate  12.5 mg Oral BID  . predniSONE  60 mg Oral Q breakfast  . tamsulosin  0.4 mg Oral QHS   Continuous Infusions:  PRN Meds: acetaminophen **OR** acetaminophen, ALPRAZolam, bisacodyl, bisacodyl, ipratropium-albuterol, nitroGLYCERIN, ondansetron **OR** ondansetron (ZOFRAN) IV, traMADol   Vital Signs    Vitals:   01/07/19 0200 01/07/19 0456 01/07/19 0508 01/07/19 0909  BP:   (!) 171/94   Pulse:   82   Resp:   17   Temp:   98.2 F (36.8 C)   TempSrc:      SpO2: 96%  100% 96%  Weight:  90 kg    Height:        Intake/Output Summary (Last 24 hours) at 01/07/2019 1031 Last data filed at 01/07/2019 0900 Gross per 24 hour  Intake 720 ml  Output 2950 ml  Net -2230 ml    Last 3 Weights 01/07/2019 01/06/2019 01/05/2019  Weight (lbs) 198 lb 6.6 oz 205 lb 7.5 oz 212 lb 1.3 oz  Weight (kg) 90 kg 93.2 kg 96.2 kg      Telemetry    Atrial flutter, HR in 80's. - Personally Reviewed  ECG    No new tracings.   Physical Exam   General: Well developed, well nourished, male appearing in no acute distress.  Head: Normocephalic, atraumatic.  Neck: Supple without bruits, JVD not elevated. Lungs:  Resp regular and unlabored, scattered rhonchi. Heart: Irregularly irregular, S1, S2, no S3, S4, or murmur; no rub. Abdomen: Soft, non-tender, non-distended with normoactive bowel sounds. No hepatomegaly. No rebound/guarding. No obvious abdominal masses. Extremities: No clubbing or cyanosis, 1+ pitting edema up to mid-shins. Distal pedal pulses are 2+ bilaterally. Neuro: Alert and oriented X 3. Moves all extremities spontaneously. Psych: Normal affect.  Labs    Chemistry Recent Labs  Lab 01/01/19 2235  01/02/19 0957  01/05/19 0649 01/06/19 0808 01/07/19 0832  NA 142   < > 143   < > 134* 133* 133*  K 5.8*   < > 4.7   < > 4.1 3.9 3.6  CL 100   < > 95*   < > 93* 88* 81*  CO2 35*   < > 35*   < > 36* 33* 39*  GLUCOSE 100*   < > 132*   < > 129* 138* 77  BUN 46*   < > 45*   < > 37* 34* 35*  CREATININE 1.88*   < > 1.52*   < > 0.98 0.86 0.99  CALCIUM 8.7*   < >  9.0   < > 8.5* 8.8* 9.0  PROT 6.6  --  6.8  --   --   --   --   ALBUMIN 3.1*  --  3.3*  --   --   --   --   AST 28  --  26  --   --   --   --   ALT 32  --  36  --   --   --   --   ALKPHOS 72  --  80  --   --   --   --   BILITOT 0.6  --  0.9  --   --   --   --   GFRNONAA 34*   < > 44*   < > >60 >60 >60  GFRAA 40*   < > 52*   < > >60 >60 >60  ANIONGAP 7  --  13   < > 5 12 13    < > = values in this interval not displayed.     Hematology Recent Labs  Lab 01/01/19 2327 01/02/19 0606 01/02/19 0957  WBC 8.2  --  5.3  RBC 4.94  --  5.12  HGB 14.2  --  14.5  HCT 51.1 49.7 51.8  MCV 103.4*  --  101.2*  MCH 28.7  --  28.3  MCHC 27.8*  --  28.0*  RDW 20.8*  --  20.3*  PLT 216  --  219    Cardiac EnzymesNo results for input(s): TROPONINI in the last 168 hours. No results for input(s): TROPIPOC in the last 168 hours.   BNP Recent Labs  Lab 01/01/19 2327  BNP 885.0*     DDimer No results for input(s): DDIMER in the last 168 hours.    Radiology    No results found.  Cardiac Studies   Echocardiogram: 01/02/2019 IMPRESSIONS   1. The left ventricle has normal systolic function, with an ejection fraction of 55-60%. The cavity size was normal. There is mildly increased left ventricular wall thickness. Left ventricular diastolic Doppler parameters are indeterminate.  2. The right ventricle has normal systolic function. The cavity was normal. There is no increase in right ventricular wall thickness. Right ventricular systolic pressure could not be assessed.  3. No evidence of mitral valve stenosis.  4. The aortic valve is tricuspid. Mild thickening of the aortic valve. Mild calcification of the aortic valve. No stenosis of the aortic valve.  5. The aorta is normal unless otherwise noted.  6. The ascending aorta is normal in size and structure.  Patient Profile     73 y.o. 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 Cardiology consulted due to new-onset atrial flutter.   Assessment & Plan    1. New-onset Atrial Flutter - New diagnosis for the patient this admission. Denies any specific palpitations and dyspnea has improved with diuresis. HR has been in the 70's to 80's. Continue Lopressor 12.5mg  BID for rate-control. - He has been started on Eliquis 5 mg twice daily with ASA being discontinued. Would consider DCCV in 3-4 weeks after initiation of anticoagulation.   2. Acute on Chronic Diastolic CHF Exacerbation -Presented with worsening dyspnea on exertion, orthopnea, and lower extremity edema with BNP  elevated to 885 and CXR showing small bilateral effusions. - he has been on IV Lasix 40mg  BID with recorded output of -9.1L thus far (-2.2L yesterday). Weight has  declined from 212 lbs on admission to 198 lbs today. He was not on diuretic therapy prior to admission. Suspect he will require at least Lasix 40mg  daily with close outpatient follow-up and repeat BMET.   3. CAD  -s/p cath in 2015 showing occluded RCA and high-grade LCx stenosis treated with DESx2 to LCx.Enzymes have been negative and echo shows a preserved EF with no regional WMA.  - remains on statin and BB therapy. ASA discontinued due to the need for anticoagulation.   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 Has remained NSR this admission.   5. HLD - followed by PCP. Remains on Atorvastatin 40mg  daily.  6. COPD - remains on 4L [place] with saturations at 96% on most recent check. On 3-4 [place] at baseline prior to admission.    For questions or updates, please contact [place] Please consult [email or web address] for contact info under Cardiology/STEMI.  [name], Erma Heritage , PA-C 10:31 AM 01/07/2019 Pager: [phone]  Pt seen and examined   I agree with findings as noted by B Strader above Pt very anxious   COmplains of CP Lungs show mild rhonchi bilaterally with rales Chest   Tender to palpation   This is pain he has felt Cardiac exam   Irreg irreg   No S3    Ext with Tr to 1+ edema  Tele:  Atrial flutter   Rates controlled  Acute diastolic CHF   I would conitnue diuresis with lasix    Pt will need po on D.C   40 daily with 10 KCL Needs low Na diet     Plan for cardioversion in a few weeks after he has been on Eliquis  Dorris Carnes MD

## 2019-01-07 NOTE — Progress Notes (Signed)
PROGRESS NOTE  Curtis [name] [id number] DOB: 1945/08/14 DOA: 01/01/2019 PCP: [name], MD  Brief History: 73 year old male with a history of pulmonary asbestosis, COPD, coronary artery disease, hypertension, hyperlipidemia, SVT status post ablation presenting with altered mental status. The patient was recently admitted to the hospital from 12/15/2018 through 12/19/2018 for sepsis secondary to pneumonia, COPD exacerbation and acute on chronic respiratory failure. He was discharged home with home health physical therapy,to finish Augmentin and prednisone taper. The patient's son states that the patient has had a generalized weakness for about a week with some confusion. This has worsened over the past 3 days to the point where the patient had not been taking any of his medications. On the day of admission, the patient was somnolent and difficult to arouse. EMS was activated, and the patient was noted to be somewhat cyanotic on his lips but arousable at that time. In the emergency room, the patient was placed on BiPAP. Chest x-ray showed vascular congestion. He was started on steroids and intravenous furosemide. The patient was afebrile hemodynamically stable with ABG initially showing 7.173/111/74 on 35%. He was placed on BiPAP. BNP was 885. Troponin was 20>>>21. Lactic acid was 1.0.  Assessment/Plan: Acute on chronic respiratory failure with hypoxia and hypercarbia -Secondary to pulmonary edema and COPD exacerbation -Personally reviewed chest x-ray--bilateral pleural plaques. Bilateral pleural effusion, right greater than left. Increased interstitial markings -Weaned off BiPAP>> currently using 3-4 L which is essentially his baseline supplementation. -Still complaining of shortness of breath with exertion; no orthopnea, no PND.[name]  COPD exacerbation -Continue Pulmicort -Continue  steroids, but transition to oral prednisone. -Continue duo nebs -Continue  oxygen supplementation  Acute diastolic CHF -Continue lasix to 40 mg IV bid for another 24 hours and then transition to Po. -provided education to patient regarding diet and fluid intake -Echocardiogram--EF 55-60%, normal RV, indeterminant diastolic  -Daily weights and Strict I's and O's -Will give 1 more dose of metolazone and follow urine output.  Dysrhythmia/SVT/Aflutter -personally reviewed EKG--consistent with aflutter -consult cardiology in setting of prior SVT with ablation and CHF -continue metoprolol and following cardiology recommendations we will start the use of Eliquis. -Follow further recommendations. -Continue to monitor on telemetry -Follow electrolytes and replete as needed.  Urinary retention -Placed Foley catheter 8/30 -Failed voiding trial 9/1 and Foley catheter was been replaced -Second attempt for Foley discontinuation on 01/06/2019 -Continue Proscar and Flomax.  Hyperkalemia -Patient received temporizing measures -Improved/resolved with IV diuresis  Acute kidney injury -Baseline creatinine 0.9-1.2 -Presented with serum creatinine 1.8 -Continue to follow renal function trend with acute diuresis. -Essentially back to baseline At this time  Coronary artery disease -No chest pain presently -Personally reviewed EKG--sinus rhythm with nonconducted atrial beats, nonspecific ST changes -Troponins not consistent with ACS  Anxiety/depression -Continue Xanax and Elavil -PMP AWARE reviewed--90 day supply of xanax 2 mg bid refilled in August 2020  Essential hypertension -Continue holding amlodipine and monitor clinically -Continue metoprolol tartrate -Heart healthy diet encouraged.  Frequent Falls -Following recommendations by PT eval; patient will need a skilled nursing facility for further care and rehabilitation. -[id number] -folic acid -[id number]  Lower extremity edema, recurrent left -Venous duplex ruleoutDVT--neg -Swelling secondary to  acute on chronic CHF.  Right and Left Heel Pressure Injury -Pursuit off load pressure -Continue secondary prevention.  Moderate protein calorie malnutrition in the setting of chronic illness -follow nutritional service recommendations for feeding supplements.   Disposition Plan:After discussing with family member  and current patient situation they had agreed to pursuit physical rehabilitation at skilled nursing facility.  Family Communication:Son updated over the phone.  Consultants:cardiology  Code Status:DNI  DVT Prophylaxis: [place] Heparin    Procedures: As Listed in Progress Note Above  Antibiotics: None   Subjective: Reports breathing is back to baseline.  No chest pain, no nausea, no vomiting, no fever.  Trace edema appreciated bilaterally, good oxygen saturation on 4 L nasal cannula.  Patient unable to pee and required foley    Objective: Vitals:   01/07/19 0456 01/07/19 0508 01/07/19 0909 01/07/19 1431  BP:  (!) 171/94    Pulse:  82    Resp:  17    Temp:  98.2 F (36.8 C)    TempSrc:      SpO2:  100% 96% 90%  Weight: 90 kg     Height:        Intake/Output Summary (Last 24 hours) at 01/07/2019 1445 Last data filed at 01/07/2019 1344 Gross per 24 hour  Intake 960 ml  Output 3950 ml  Net -2990 ml   Weight change: -3.2 kg  Exam: General exam: Alert, awake, oriented x 3; reports good urine output.  No chest pain, breathing back to his baseline.  Still having difficulty to urinate.  No nausea, no vomiting, no PND. Respiratory system: Improved air movement bilaterally, no crackles, no wheezing, positive rhonchi.  No using accessory muscles and with good O2 sat on chronic 4 L of oxygen supplementation. Cardiovascular system:Rate controlled, no rubs, no gallops.  Gastrointestinal system: Abdomen is nondistended, soft and nontender. No organomegaly or masses felt. Normal bowel sounds heard. Central nervous system: Alert and oriented. No focal  neurological deficits. Extremities: No cyanosis or clubbing; trace edema appreciated bilaterally. Skin: No petechiae.  No open wounds.  Pressure injury on his right heel and also mild changes of pressure injury on his left heel appreciated on exam (base on reports from prior physician, present on admission; definitely present since my evaluation on 01/05/19. Psychiatry: Judgement and insight appear normal. Mood & affect appropriate.   Data Reviewed: I have personally reviewed following labs and imaging studies  Basic Metabolic Panel: Recent Labs  Lab 01/02/19 0957  01/03/19 0425  01/03/19 1155 01/04/19 0525 01/05/19 0649 01/06/19 0808 01/07/19 0832  NA 143  --  139  --   --  137 134* 133* 133*  K 4.7   < > 4.6   < > 4.9 4.1 4.1 3.9 3.6  CL 95*  --  94*  --   --  90* 93* 88* 81*  CO2 35*  --  35*  --   --  36* 36* 33* 39*  GLUCOSE 132*  --  144*  --   --  150* 129* 138* 77  BUN 45*  --  45*  --   --  45* 37* 34* 35*  CREATININE 1.52*  --  1.32*  --   --  1.11 0.98 0.86 0.99  CALCIUM 9.0  --  8.6*  --   --  8.5* 8.5* 8.8* 9.0  MG 2.0  --  1.9  --   --  1.9 1.9  --   --    < > = values in this interval not displayed.   Liver Function Tests: Recent Labs  Lab 01/01/19 2235 01/02/19 0957  AST 28 26  ALT 32 36  ALKPHOS 72 80  BILITOT 0.6 0.9  PROT 6.6 6.8  ALBUMIN 3.1* 3.3*   CBC:  Recent Labs  Lab 01/01/19 2327 01/02/19 0606 01/02/19 0957  WBC 8.2  --  5.3  NEUTROABS 6.6  --   --   HGB 14.2  --  14.5  HCT 51.1 49.7 51.8  MCV 103.4*  --  101.2*  PLT 216  --  219   Urine analysis:    Component Value Date/Time   COLORURINE AMBER (A) 01/02/2019 0046   APPEARANCEUR HAZY (A) 01/02/2019 0046   LABSPEC 1.026 01/02/2019 0046   PHURINE 5.0 01/02/2019 0046   GLUCOSEU NEGATIVE 01/02/2019 0046   HGBUR NEGATIVE 01/02/2019 0046   BILIRUBINUR NEGATIVE 01/02/2019 0046   KETONESUR NEGATIVE 01/02/2019 0046   PROTEINUR 100 (A) 01/02/2019 0046   UROBILINOGEN 0.2 02/12/2015 1859    NITRITE NEGATIVE 01/02/2019 0046   LEUKOCYTESUR NEGATIVE 01/02/2019 0046    Recent Results (from the past 240 hour(s))  SARS Coronavirus 2 [facility] order, Performed in [facility] hospital lab) Nasopharyngeal Nasopharyngeal Swab     Status: None   Collection Time: 01/01/19 10:21 PM   Specimen: Nasopharyngeal Swab  Result Value Ref Range Status   SARS Coronavirus 2 NEGATIVE NEGATIVE Final    Comment: (NOTE) If result is NEGATIVE SARS-CoV-2 target nucleic acids are NOT DETECTED. The SARS-CoV-2 RNA is generally detectable in upper and lower  respiratory specimens during the acute phase of infection. The lowest  concentration of SARS-CoV-2 viral copies this assay can detect is 250  copies / mL. A negative result does not preclude SARS-CoV-2 infection  and should not be used as the sole basis for treatment or other  patient management decisions.  A negative result may occur with  improper specimen collection / handling, submission of specimen other  than nasopharyngeal swab, presence of viral mutation(s) within the  areas targeted by this assay, and inadequate number of viral copies  (<250 copies / mL). A negative result must be combined with clinical  observations, patient history, and epidemiological information. If result is POSITIVE SARS-CoV-2 target nucleic acids are DETECTED. The SARS-CoV-2 RNA is generally detectable in upper and lower  respiratory specimens dur ing the acute phase of infection.  Positive  results are indicative of active infection with SARS-CoV-2.  Clinical  correlation with patient history and other diagnostic information is  necessary to determine patient infection status.  Positive results do  not rule out bacterial infection or co-infection with other viruses. If result is PRESUMPTIVE POSTIVE SARS-CoV-2 nucleic acids MAY BE PRESENT.   A presumptive positive result was obtained on the submitted specimen  and confirmed on repeat testing.  While 2019 novel  coronavirus  (SARS-CoV-2) nucleic acids may be present in the submitted sample  additional confirmatory testing may be necessary for epidemiological  and / or clinical management purposes  to differentiate between  SARS-CoV-2 and other Sarbecovirus currently known to infect humans.  If clinically indicated additional testing with an alternate test  methodology [phone]) is advised. The SARS-CoV-2 RNA is generally  detectable in upper and lower respiratory sp ecimens during the acute  phase of infection. The expected result is Negative. Fact Sheet for Patients:  StrictlyIdeas.no Fact Sheet for Healthcare Providers: BankingDealers.co.za This test is not yet approved or cleared by the [place] FDA and has been authorized for detection and/or diagnosis of SARS-CoV-2 by FDA under an Emergency Use Authorization (EUA).  This EUA will remain in effect (meaning this test can be used) for the duration of the COVID-19 declaration under Section 564(b)(1) of the Act, 21 U.S.C. section 360bbb-3(b)(1), unless the  authorization is terminated or revoked sooner. Performed at [facility], [address] [place], [place] [address]   Urine culture     Status: Abnormal   Collection Time: 01/02/19 12:46 AM   Specimen: Urine, Catheterized  Result Value Ref Range Status   Specimen Description   Final    URINE, CATHETERIZED Performed at [facility], [address] [place], [place] [address]    Special Requests   Final    Normal Performed at [facility], [address] [place], [place] [address]    Culture (A)  Final    >=100,000 COLONIES/mL STAPHYLOCOCCUS SPECIES (COAGULASE NEGATIVE)   Report Status 01/04/2019 FINAL  Final   Organism ID, Bacteria STAPHYLOCOCCUS SPECIES (COAGULASE NEGATIVE) (A)  Final      Susceptibility   Staphylococcus species (coagulase negative) - MIC*    CIPROFLOXACIN >=8 RESISTANT Resistant     GENTAMICIN <=0.5 SENSITIVE  Sensitive     NITROFURANTOIN <=16 SENSITIVE Sensitive     OXACILLIN >=4 RESISTANT Resistant     TETRACYCLINE 2 SENSITIVE Sensitive     VANCOMYCIN 2 SENSITIVE Sensitive     TRIMETH/SULFA <=10 SENSITIVE Sensitive     CLINDAMYCIN <=0.25 SENSITIVE Sensitive     RIFAMPIN <=0.5 SENSITIVE Sensitive     Inducible Clindamycin NEGATIVE Sensitive     * >=100,000 COLONIES/mL STAPHYLOCOCCUS SPECIES (COAGULASE NEGATIVE)  MRSA PCR Screening     Status: None   Collection Time: 01/02/19  8:35 AM   Specimen: Nasopharyngeal  Result Value Ref Range Status   MRSA by PCR NEGATIVE NEGATIVE Final    Comment:        The GeneXpert MRSA Assay (FDA approved for NASAL specimens only), is one component of a comprehensive MRSA colonization surveillance program. It is not intended to diagnose MRSA infection nor to guide or monitor treatment for MRSA infections. Performed at [facility], [address], [place], [place] [address]   Culture, sputum-assessment     Status: None   Collection Time: 01/02/19  8:39 AM   Specimen: Sputum  Result Value Ref Range Status   Specimen Description SPUTUM  Final   Special Requests NONE  Final   Sputum evaluation   Final    Sputum specimen not acceptable for testing.  Please recollect.   NOTIFIED NURSE DAVIS AT 1300 ON 01/02/2019 BY EVA Performed at [facility], [address] [place], [place] [address]    Report Status 01/02/2019 FINAL  Final     Scheduled Meds:  amitriptyline  100 mg Oral QHS   aspirin EC  81 mg Oral Daily   atorvastatin  40 mg Oral q1800   budesonide (PULMICORT) nebulizer solution  0.5 mg Nebulization BID   Chlorhexidine Gluconate Cloth  6 each Topical Daily   finasteride  5 mg Oral q morning - 10a   furosemide  40 mg Intravenous BID   heparin  5,000 Units Subcutaneous Q8H   ipratropium-albuterol  3 mL Nebulization TID   mouth rinse  15 mL Mouth Rinse BID   metoprolol tartrate  12.5 mg Oral BID   predniSONE  60 mg Oral Q breakfast     tamsulosin  0.4 mg Oral QHS   Procedures/Studies: US Venous Img Lower Bilateral  Result Date: 01/02/2019 CLINICAL DATA:  73 year old male with lower extremity edema EXAM: BILATERAL LOWER EXTREMITY VENOUS DOPPLER ULTRASOUND TECHNIQUE: Gray-scale sonography with graded compression, as well as color Doppler and duplex ultrasound were performed to evaluate the lower extremity deep venous systems from the level of the  common femoral vein and including the common femoral, femoral, profunda femoral, popliteal and calf veins including the posterior tibial, peroneal and gastrocnemius veins when visible. The superficial great saphenous vein was also interrogated. Spectral Doppler was utilized to evaluate flow at rest and with distal augmentation maneuvers in the common femoral, femoral and popliteal veins. COMPARISON:  None. FINDINGS: RIGHT LOWER EXTREMITY Common Femoral Vein: No evidence of thrombus. Normal compressibility, respiratory phasicity and response to augmentation. Increased pulsatility of the venous waveforms. Saphenofemoral Junction: No evidence of thrombus. Normal compressibility and flow on color Doppler imaging. Profunda Femoral Vein: No evidence of thrombus. Normal compressibility and flow on color Doppler imaging. Femoral Vein: No evidence of thrombus. Normal compressibility, respiratory phasicity and response to augmentation. Popliteal Vein: No evidence of thrombus. Normal compressibility, respiratory phasicity and response to augmentation. Calf Veins: No evidence of thrombus. Normal compressibility and flow on color Doppler imaging. Superficial Great Saphenous Vein: No evidence of thrombus. Normal compressibility. Venous Reflux:  None. Other Findings:  None. LEFT LOWER EXTREMITY Common Femoral Vein: No evidence of thrombus. Normal compressibility, respiratory phasicity and response to augmentation. Increased pulsatility of the venous waveforms. Saphenofemoral Junction: No evidence of thrombus.  Normal compressibility and flow on color Doppler imaging. Profunda Femoral Vein: No evidence of thrombus. Normal compressibility and flow on color Doppler imaging. Femoral Vein: No evidence of thrombus. Normal compressibility, respiratory phasicity and response to augmentation. Popliteal Vein: No evidence of thrombus. Normal compressibility, respiratory phasicity and response to augmentation. Calf Veins: No evidence of thrombus. Normal compressibility and flow on color Doppler imaging. Superficial Great Saphenous Vein: No evidence of thrombus. Normal compressibility. Venous Reflux:  None. Other Findings:  None. IMPRESSION: 1. No evidence of deep venous thrombosis in either lower extremity. 2. Increased pulsatility of the venous waveforms suggests elevated right heart pressures. Findings can be seen in tricuspid regurgitation, right heart failure, pulmonary hypertension and COPD. Electronically Signed   By: Jacqulynn Cadet M.D.   On: 01/02/2019 14:30   Portable Chest 1 View  Result Date: 01/03/2019 CLINICAL DATA:  Shortness of breath and altered mental status. Congestive heart failure. EXAM: PORTABLE CHEST 1 VIEW COMPARISON:  01/01/2019 and 08/20/2017 FINDINGS: Stable cardiomegaly and pulmonary vascular congestion. Aortic atherosclerosis. Improved aeration of both lungs is seen. Bilateral pleural-parenchymal scarring remains stable compared to 2019. No evidence of acute infiltrate or pleural effusion. IMPRESSION: Stable cardiomegaly, pulmonary vascular congestion, and bilateral pleural-parenchymal scarring. No acute findings. Electronically Signed   By: Marlaine Hind M.D.   On: 01/03/2019 05:19   Dg Chest Port 1 View  Result Date: 01/01/2019 CLINICAL DATA:  73 year old male with shortness of breath EXAM: PORTABLE CHEST 1 VIEW COMPARISON:  Chest radiograph dated 12/15/2018 FINDINGS: There is cardiomegaly with vascular congestion. There is shallow inspiration. Small bilateral pleural effusions and bibasilar  densities similar to prior radiograph which may represent atelectasis or infiltrate. No pneumothorax. Stable cardiomegaly. No acute osseous pathology. IMPRESSION: 1. Cardiomegaly with vascular congestion. 2. Small bilateral pleural effusions and bibasilar densities similar to prior radiograph. Electronically Signed   By: Anner Crete M.D.   On: 01/01/2019 23:20   Dg Chest Port 1 View  Result Date: 12/15/2018 CLINICAL DATA:  Fever, weakness EXAM: PORTABLE CHEST 1 VIEW COMPARISON:  08/20/2017 chest radiograph. FINDINGS: Low lung volumes. Stable cardiomediastinal silhouette with moderate cardiomegaly. No pneumothorax. Calcified bilateral pleural plaques and pleural thickening, unchanged. No convincing pleural effusion. Patchy consolidation at both lung bases superimposed on chronic reticular opacities. No overt pulmonary edema. IMPRESSION: 1. Patchy consolidation superimposed  on chronic reticular opacities at the lung bases, cannot exclude aspiration or pneumonia. Chest radiograph follow-up advised. 2. 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 3. Moderate cardiomegaly without overt pulmonary edema. Electronically Signed   By: Ilona Sorrel M.D.   On: 12/15/2018 18:08   Dg Knee Complete 4 Views Right  Result Date: 12/15/2018 CLINICAL DATA:  Fall, right knee pain when moving EXAM: RIGHT KNEE - COMPLETE 4+ VIEW COMPARISON:  None. FINDINGS: No fracture or dislocation. Small suprapatellar right knee joint effusion. No suspicious focal osseous lesion. Small superior right patellar enthesophyte. No significant degenerative arthropathy. Posterior vascular calcifications. No radiopaque foreign body. IMPRESSION: Small suprapatellar right knee joint effusion, with no right knee fracture or dislocation. Electronically Signed   By: Ilona Sorrel M.D.   On: 12/15/2018 18:05    Barton Dubois, MD  [facility] Pager [phone]  01/07/2019, 2:45 PM   LOS: 5  days

## 2019-01-07 NOTE — TOC Progression Note (Signed)
Transition of Care [facility]) - Progression Note    Patient Details  Name: SHAWNTEZ CALDERONE MRN: [id number] Date of Birth: 21-Mar-1946  Transition of [facility]) CM/SW Contact  Ihor Gully, LCSW Phone Number: 01/07/2019, 3:31 PM  Clinical Narrative:    Patient and son Elta [place] are now interested in SNF. Choices provided and patient referral sent to choices.  Patient accepted at [facility].    Expected Discharge Plan: [place] Barriers to Discharge: No Barriers Identified  Expected Discharge Plan and Services Expected Discharge Plan: [place]   Discharge Planning Services: Follow-up appt scheduled Post Acute Care Choice: Home Health Living arrangements for the past 2 months: Single Family Home Expected Discharge Date: 01/07/19                         HH Arranged: PT, RN   Date HH Agency Contacted: 01/04/19 Time [place]: 1136     Social Determinants of Health (SDOH) Interventions    Readmission Risk Interventions Readmission Risk Prevention Plan 01/04/2019 12/19/2018  Transportation Screening Complete Complete  PCP or Specialist Appt within 5-7 Days - Complete  PCP or Specialist Appt within 3-5 Days Complete -  Home Care Screening - Complete  Medication Review (RN CM) - Complete  HRI or Home Care Consult Complete -  Social Work Consult for [place] Planning/Counseling Complete -  Palliative Care Screening Not Applicable -  Medication Review Press photographer) Complete -  Some recent data might be hidden

## 2019-01-07 NOTE — Care Management Important Message (Signed)
Important Message  Patient Details  Name: Curtis [name] MRN: [id number] Date of Birth: April 26, 1946   Medicare Important Message Given:  Yes     Tommy Medal 01/07/2019, 12:26 PM

## 2019-01-07 NOTE — [place] FL2 (Signed)
[place] MEDICAID FL2 LEVEL OF CARE SCREENING TOOL     IDENTIFICATION  Patient Name: Curtis [name] Birthdate: June 17, 1945 Sex: male Admission Date (Current Location): 01/01/2019  [facility] and [place] Number:  [employer] and Address:  [facility] [address] [place]      Provider Number: [id number]  Attending Physician Name and Address:  Barton Dubois, MD  Relative Name and Phone Number:       Current Level of Care: Hospital Recommended Level of Care: [place] Prior Approval Number:    Date Approved/Denied:   PASRR Number: [id number]  Discharge Plan: SNF    Current Diagnoses: Patient Active Problem List   Diagnosis Date Noted  . Acute diastolic CHF (congestive heart failure) ([place]) 01/03/2019  . Pressure injury of skin 01/03/2019  . COPD exacerbation ([place]) 01/02/2019  . Acute on chronic respiratory failure with hypoxia and hypercapnia ([place]) 01/02/2019  . Acute CHF (congestive heart failure) ([place]) 01/02/2019  . Hyperkalemia 01/02/2019  . Sepsis ([place]) 12/15/2018  . Carotid artery disease ([place]) 02/27/2016  . Acute hyponatremia 02/12/2015  . Hereditary and idiopathic peripheral neuropathy 01/30/2015  . Acute on chronic respiratory failure ([place]) 05/08/2014  . Anemia, iron deficiency 05/08/2014  . Hyponatremia 05/08/2014  . Urinary retention 05/08/2014  . Chronic respiratory failure with hypoxia ([place]) 04/30/2014  . Shortness of breath   . Aspiration into airway   . HCAP (healthcare-associated pneumonia) 04/24/2014  . Hyperlipidemia LDL goal <70 04/20/2014  . AAA (abdominal aortic aneurysm), stable 04/20/2014  . Abdominal pain, generalized   . Chest pain   . SOB (shortness of breath)   . DOE (dyspnea on exertion), may be Brilinta this was stopped changed to Plavix 04/18/2014  . Peripheral vascular disease ([place])   . Presence of drug coated stent in left circumflex coronary artery: Promus DES 3.5 mm x 38 mm  (3.9 mm) 01/17/2014    Class: Acute  . CAD (coronary artery disease), native coronary artery   . SVT (supraventricular tachycardia) s/p ablation 01/16/2014 01/16/2014  . 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  . TIA (transient ischemic attack) 09/06/2013  . HTN (hypertension) 09/05/2013  . AKI (acute kidney injury) ([place]) 11/01/2012  . TOBACCO ABUSE, hx 08/23/2007  . COPD with chronic bronchitis ([place]) 08/12/2007  . 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 Reflex sympathetic dystrophy 05/29/2007  . GERD 05/29/2007    Orientation RESPIRATION BLADDER Height & Weight     Self, Time, Situation, Place    Continent Weight: 198 lb 6.6 oz (90 kg) Height:  6' (182.9 cm)  BEHAVIORAL SYMPTOMS/MOOD NEUROLOGICAL BOWEL NUTRITION STATUS      Continent Diet(see dc summary)  AMBULATORY STATUS COMMUNICATION OF NEEDS Skin   Extensive Assist Verbally Normal                       Personal Care Assistance Level of Assistance  Bathing, Feeding, Dressing Bathing Assistance: Limited assistance Feeding assistance: Independent Dressing Assistance: Limited assistance     Functional Limitations Info  Sight, Hearing, Speech Sight Info: Adequate Hearing Info: Adequate Speech Info: Adequate    SPECIAL CARE FACTORS FREQUENCY  PT (By licensed PT)     PT Frequency: 5 times week              Contractures Contractures Info: Not present    Additional Factors Info  Code Status, Allergies, Psychotropic Code Status Info: Partial Allergies Info: Codeine, Trileptal Psychotropic Info: Xanax  Current Medications (01/07/2019):  This is the current hospital active medication list Current Facility-Administered Medications  Medication Dose Route Frequency Provider Last Rate Last Dose  . acetaminophen (TYLENOL) tablet 650 mg  650 mg Oral Q6H PRN Tat, David, MD       Or  . acetaminophen (TYLENOL) suppository 650 mg  650 mg Rectal Q6H PRN Tat, Shanon Brow, MD      . ALPRAZolam [name]) tablet 1 mg  1  mg Oral BID PRN Orson Eva, MD   1 mg at 01/06/19 2115  . amitriptyline (ELAVIL) tablet 100 mg  100 mg Oral Benay Pike, MD   100 mg at 01/06/19 2115  . aspirin EC tablet 81 mg  81 mg Oral Daily Tat, Shanon Brow, MD   81 mg at 01/07/19 0916  . atorvastatin (LIPITOR) tablet 40 mg  40 mg Oral q1800 Orson Eva, MD   40 mg at 01/06/19 1832  . bisacodyl (DULCOLAX) EC tablet 5 mg  5 mg Oral Daily PRN Tat, Shanon Brow, MD      . bisacodyl (DULCOLAX) suppository 10 mg  10 mg Rectal Daily PRN Tat, Shanon Brow, MD      . budesonide (PULMICORT) nebulizer solution 0.5 mg  0.5 mg Nebulization BID Tat, David, MD   0.5 mg at 01/07/19 0914  . Chlorhexidine Gluconate Cloth 2 % PADS 6 each  6 each Topical Daily Tat, David, MD   6 each at 01/07/19 [id number]  . finasteride (PROSCAR) tablet 5 mg  5 mg Oral q morning - 10a Tat, David, MD   5 mg at 01/07/19 0916  . furosemide (LASIX) injection 40 mg  40 mg Intravenous BID Orson Eva, MD   40 mg at 01/07/19 0918  . heparin injection 5,000 Units  5,000 Units Subcutaneous Franco Collet, MD   5,000 Units at 01/07/19 1338  . ipratropium-albuterol (DUONEB) 0.5-2.5 (3) MG/3ML nebulizer solution 3 mL  3 mL Nebulization Q2H PRN Tat, David, MD      . ipratropium-albuterol (DUONEB) 0.5-2.5 (3) MG/3ML nebulizer solution 3 mL  3 mL Nebulization TID Barton Dubois, MD   3 mL at 01/07/19 1429  . MEDLINE mouth rinse  15 mL Mouth Rinse BID Tat, David, MD   15 mL at 01/07/19 [id number]  . metoprolol tartrate (LOPRESSOR) tablet 12.5 mg  12.5 mg Oral BID Tat, David, MD   12.5 mg at 01/07/19 0916  . nitroGLYCERIN (NITROSTAT) SL tablet 0.4 mg  0.4 mg Sublingual Q5 Min x 3 PRN Tat, David, MD      . ondansetron (ZOFRAN) tablet 4 mg  4 mg Oral Q6H PRN Tat, David, MD       Or  . ondansetron (ZOFRAN) injection 4 mg  4 mg Intravenous Q6H PRN Tat, David, MD      . predniSONE (DELTASONE) tablet 60 mg  60 mg Oral Q breakfast Barton Dubois, MD   60 mg at 01/07/19 [id number]  . tamsulosin (FLOMAX) capsule 0.4 mg  0.4 mg Oral Benay Pike, MD   0.4 mg at 01/06/19 2115  . traMADol (ULTRAM) tablet 50 mg  50 mg Oral Q6H PRN Orson Eva, MD         Discharge Medications: Please see discharge summary for a list of discharge medications.  Relevant Imaging Results:  Relevant Lab Results:   Additional Information SSN: [id number] [place]

## 2019-01-08 MED ORDER — FUROSEMIDE 40 MG PO TABS
40.0000 mg | ORAL_TABLET | Freq: Every day | ORAL | Status: DC
  Administered 2019-01-08 – 2019-01-11 (×4): 40 mg via ORAL
  Filled 2019-01-08 (×4): qty 1

## 2019-01-08 MED ORDER — ALPRAZOLAM 1 MG PO TABS
1.0000 mg | ORAL_TABLET | Freq: Three times a day (TID) | ORAL | Status: DC | PRN
  Administered 2019-01-08 – 2019-01-10 (×5): 1 mg via ORAL
  Filled 2019-01-08 (×5): qty 1

## 2019-01-08 MED ORDER — PREDNISONE 20 MG PO TABS
40.0000 mg | ORAL_TABLET | Freq: Every day | ORAL | Status: DC
  Administered 2019-01-09 – 2019-01-10 (×2): 40 mg via ORAL
  Filled 2019-01-08 (×2): qty 2

## 2019-01-08 NOTE — Progress Notes (Signed)
PROGRESS NOTE  CLEARANCE [name] DOB: October 25, 1945 DOA: 01/01/2019 PCP: [name], MD  Brief History: 73 year old male with a history of pulmonary asbestosis, COPD, coronary artery disease, hypertension, hyperlipidemia, SVT status post ablation presenting with altered mental status. The patient was recently admitted to the hospital from 12/15/2018 through 12/19/2018 for sepsis secondary to pneumonia, COPD exacerbation and acute on chronic respiratory failure. He was discharged home with home health physical therapy,to finish Augmentin and prednisone taper. The patient's son states that the patient has had a generalized weakness for about a week with some confusion. This has worsened over the past 3 days to the point where the patient had not been taking any of his medications. On the day of admission, the patient was somnolent and difficult to arouse. EMS was activated, and the patient was noted to be somewhat cyanotic on his lips but arousable at that time. In the emergency room, the patient was placed on BiPAP. Chest x-ray showed vascular congestion. He was started on steroids and intravenous furosemide. The patient was afebrile hemodynamically stable with ABG initially showing 7.173/111/74 on 35%. He was placed on BiPAP. BNP was 885. Troponin was 20>>>21. Lactic acid was 1.0.  Assessment/Plan: Acute on chronic respiratory failure with hypoxia and hypercarbia -Secondary to pulmonary edema and COPD exacerbation -Personally reviewed chest x-ray--bilateral pleural plaques. Bilateral pleural effusion, right greater than left. Increased interstitial markings -Weaned off BiPAP>> currently using 3-4 L which is essentially his baseline supplementation. -Still complaining of shortness of breath with exertion; no orthopnea, no PND.[name]  COPD exacerbation -Continue Pulmicort -Continue steroids tapering, Continue duo nebs and Continue oxygen  supplementation. -Continue flutter valve.   Acute diastolic CHF -Continue lasix oral route, 40 mg daily.    -provided education to patient regarding diet and fluid intake -Echocardiogram--EF 55-60%, normal RV, indeterminant diastolic  -Daily weights and Strict I's and O's -continue low sodium diet.  Dysrhythmia/SVT/Aflutter -personally reviewed EKG--consistent with aflutter -consult cardiology in setting of prior SVT with ablation and CHF -continue metoprolol and following cardiology recommendations we will start the use of Eliquis. -Follow further recommendations. -Continue to monitor on telemetry -Follow electrolytes and replete as needed.  Urinary retention -Placed Foley catheter 8/30 -Failed voiding trial 9/1 and Foley catheter was been replaced -Second attempt for Foley discontinuation on 01/06/2019 -Continue Proscar and Flomax.  Hyperkalemia -Patient received temporizing measures -Improved/resolved with IV diuresis  Acute kidney injury -Baseline creatinine 0.9-1.2 -Presented with serum creatinine 1.8 -Continue to follow renal function trend with acute diuresis. -Essentially back to baseline At this time  Coronary artery disease -No chest pain presently -Personally reviewed EKG--sinus rhythm with nonconducted atrial beats, nonspecific ST changes -Troponins not consistent with ACS  Anxiety/depression -Continue Xanax and Elavil; Xanax dose adjusted to 3 times a day (every 8 hours as needed) for anxiety. -PMP AWARE reviewed--90 day supply of xanax 2 mg bid refilled in August 2020  Essential hypertension -Continue holding amlodipine and monitor clinically -Continue metoprolol tartrate -Heart healthy diet encouraged.  Frequent Falls -Following recommendations by PT eval; patient will need a skilled nursing facility for further care and rehabilitation. -[id number] -folic acid -[id number]  Lower extremity edema, recurrent left -Venous duplex  ruleoutDVT--neg -Swelling secondary to acute on chronic CHF.  Right and Left Heel Pressure Injury -Pursuit off load pressure -Continue secondary prevention.  Moderate protein calorie malnutrition in the setting of chronic illness -follow nutritional service recommendations for feeding supplements.   Disposition Plan:After discussing with  family member and current patient situation they had agreed to pursuit physical rehabilitation at skilled nursing facility.  Family Communication:Son updated over the phone.  Consultants:cardiology  Code Status:DNI  DVT Prophylaxis: [place] Heparin    Procedures: As Listed in Progress Note Above  Antibiotics: None   Subjective: Reports breathing is stable at baseline for HEENT; no chest pain, no palpitations, no nausea, no vomiting.  Reports of feeling anxious and otherwise just feeling weak and deconditioned.     Objective: Vitals:   01/08/19 0804 01/08/19 0809 01/08/19 1319 01/08/19 1426  BP:   (!) 112/58   Pulse:   66   Resp:   15   Temp:   98.2 F (36.8 C)   TempSrc:   Oral   SpO2: 99% 100% 100% 100%  Weight:      Height:        Intake/Output Summary (Last 24 hours) at 01/08/2019 1609 Last data filed at 01/08/2019 1300 Gross per 24 hour  Intake 1080 ml  Output 1400 ml  Net -320 ml   Weight change:   Exam: General exam: Alert, awake, oriented x 3, no chest pain, no palpitations, no nausea, no vomiting.  Reports breathing is a stable and back to his baseline.  Continues to have good urine output. Respiratory system: Improved air movement bilaterally, no crackles, no wheezing; positive rhonchi are appreciated on exam.  Good oxygen saturation on chronic 4 L of nasal cannula supplementation. Cardiovascular system:Rate controlled, no rubs, no gallops.  Gastrointestinal system: Abdomen is nondistended, soft and nontender. No organomegaly or masses felt. Normal bowel sounds heard. Central nervous system: Alert and  oriented. No focal neurological deficits. Extremities: No cyanosis or clubbing, trace edema appreciated bilaterally. Skin: No petechiae.  No open wounds.  Patient with deep tissue injury on his right heel and also mild changes of tissue injury on his left heel ; unchanged Since examination on 01/05/19. Psychiatry: Judgement and insight appear normal.  Patient appears to be anxious on exam.  Data Reviewed: I have personally reviewed following labs and imaging studies  Basic Metabolic Panel: Recent Labs  Lab 01/02/19 0957  01/03/19 0425  01/03/19 1155 01/04/19 0525 01/05/19 0649 01/06/19 0808 01/07/19 0832  NA 143  --  139  --   --  137 134* 133* 133*  K 4.7   < > 4.6   < > 4.9 4.1 4.1 3.9 3.6  CL 95*  --  94*  --   --  90* 93* 88* 81*  CO2 35*  --  35*  --   --  36* 36* 33* 39*  GLUCOSE 132*  --  144*  --   --  150* 129* 138* 77  BUN 45*  --  45*  --   --  45* 37* 34* 35*  CREATININE 1.52*  --  1.32*  --   --  1.11 0.98 0.86 0.99  CALCIUM 9.0  --  8.6*  --   --  8.5* 8.5* 8.8* 9.0  MG 2.0  --  1.9  --   --  1.9 1.9  --   --    < > = values in this interval not displayed.   Liver Function Tests: Recent Labs  Lab 01/01/19 2235 01/02/19 0957  AST 28 26  ALT 32 36  ALKPHOS 72 80  BILITOT 0.6 0.9  PROT 6.6 6.8  ALBUMIN 3.1* 3.3*   CBC: Recent Labs  Lab 01/01/19 2327 01/02/19 0606 01/02/19 0957  WBC 8.2  --  5.3  NEUTROABS 6.6  --   --   HGB 14.2  --  14.5  HCT 51.1 49.7 51.8  MCV 103.4*  --  101.2*  PLT 216  --  219   Urine analysis:    Component Value Date/Time   COLORURINE AMBER (A) 01/02/2019 0046   APPEARANCEUR HAZY (A) 01/02/2019 0046   LABSPEC 1.026 01/02/2019 0046   PHURINE 5.0 01/02/2019 0046   GLUCOSEU NEGATIVE 01/02/2019 0046   HGBUR NEGATIVE 01/02/2019 0046   BILIRUBINUR NEGATIVE 01/02/2019 0046   KETONESUR NEGATIVE 01/02/2019 0046   PROTEINUR 100 (A) 01/02/2019 0046   UROBILINOGEN 0.2 02/12/2015 1859   NITRITE NEGATIVE 01/02/2019 0046    LEUKOCYTESUR NEGATIVE 01/02/2019 0046    Recent Results (from the past 240 hour(s))  SARS Coronavirus 2 [facility] order, Performed in [facility] hospital lab) Nasopharyngeal Nasopharyngeal Swab     Status: None   Collection Time: 01/01/19 10:21 PM   Specimen: Nasopharyngeal Swab  Result Value Ref Range Status   SARS Coronavirus 2 NEGATIVE NEGATIVE Final    Comment: (NOTE) If result is NEGATIVE SARS-CoV-2 target nucleic acids are NOT DETECTED. The SARS-CoV-2 RNA is generally detectable in upper and lower  respiratory specimens during the acute phase of infection. The lowest  concentration of SARS-CoV-2 viral copies this assay can detect is 250  copies / mL. A negative result does not preclude SARS-CoV-2 infection  and should not be used as the sole basis for treatment or other  patient management decisions.  A negative result may occur with  improper specimen collection / handling, submission of specimen other  than nasopharyngeal swab, presence of viral mutation(s) within the  areas targeted by this assay, and inadequate number of viral copies  (<250 copies / mL). A negative result must be combined with clinical  observations, patient history, and epidemiological information. If result is POSITIVE SARS-CoV-2 target nucleic acids are DETECTED. The SARS-CoV-2 RNA is generally detectable in upper and lower  respiratory specimens dur ing the acute phase of infection.  Positive  results are indicative of active infection with SARS-CoV-2.  Clinical  correlation with patient history and other diagnostic information is  necessary to determine patient infection status.  Positive results do  not rule out bacterial infection or co-infection with other viruses. If result is PRESUMPTIVE POSTIVE SARS-CoV-2 nucleic acids MAY BE PRESENT.   A presumptive positive result was obtained on the submitted specimen  and confirmed on repeat testing.  While 2019 novel coronavirus  (SARS-CoV-2) nucleic  acids may be present in the submitted sample  additional confirmatory testing may be necessary for epidemiological  and / or clinical management purposes  to differentiate between  SARS-CoV-2 and other Sarbecovirus currently known to infect humans.  If clinically indicated additional testing with an alternate test  methodology [phone]) is advised. The SARS-CoV-2 RNA is generally  detectable in upper and lower respiratory sp ecimens during the acute  phase of infection. The expected result is Negative. Fact Sheet for Patients:  StrictlyIdeas.no Fact Sheet for Healthcare Providers: BankingDealers.co.za This test is not yet approved or cleared by the [place] FDA and has been authorized for detection and/or diagnosis of SARS-CoV-2 by FDA under an Emergency Use Authorization (EUA).  This EUA will remain in effect (meaning this test can be used) for the duration of the COVID-19 declaration under Section 564(b)(1) of the Act, 21 U.S.C. section 360bbb-3(b)(1), unless the authorization is terminated or revoked sooner. Performed at [facility], [address] [place], [place]  [address]   Urine culture     Status: Abnormal   Collection Time: 01/02/19 12:46 AM   Specimen: Urine, Catheterized  Result Value Ref Range Status   Specimen Description   Final    URINE, CATHETERIZED Performed at [facility], [address], [place], [place] [address]    Special Requests   Final    Normal Performed at [facility], [address] [place], [place] [address]    Culture (A)  Final    >=100,000 COLONIES/mL STAPHYLOCOCCUS SPECIES (COAGULASE NEGATIVE)   Report Status 01/04/2019 FINAL  Final   Organism ID, Bacteria STAPHYLOCOCCUS SPECIES (COAGULASE NEGATIVE) (A)  Final      Susceptibility   Staphylococcus species (coagulase negative) - MIC*    CIPROFLOXACIN >=8 RESISTANT Resistant     GENTAMICIN <=0.5 SENSITIVE Sensitive     NITROFURANTOIN <=16  SENSITIVE Sensitive     OXACILLIN >=4 RESISTANT Resistant     TETRACYCLINE 2 SENSITIVE Sensitive     VANCOMYCIN 2 SENSITIVE Sensitive     TRIMETH/SULFA <=10 SENSITIVE Sensitive     CLINDAMYCIN <=0.25 SENSITIVE Sensitive     RIFAMPIN <=0.5 SENSITIVE Sensitive     Inducible Clindamycin NEGATIVE Sensitive     * >=100,000 COLONIES/mL STAPHYLOCOCCUS SPECIES (COAGULASE NEGATIVE)  MRSA PCR Screening     Status: None   Collection Time: 01/02/19  8:35 AM   Specimen: Nasopharyngeal  Result Value Ref Range Status   MRSA by PCR NEGATIVE NEGATIVE Final    Comment:        The GeneXpert MRSA Assay (FDA approved for NASAL specimens only), is one component of a comprehensive MRSA colonization surveillance program. It is not intended to diagnose MRSA infection nor to guide or monitor treatment for MRSA infections. Performed at [facility], [address], [place], [place] [address]   Culture, sputum-assessment     Status: None   Collection Time: 01/02/19  8:39 AM   Specimen: Sputum  Result Value Ref Range Status   Specimen Description SPUTUM  Final   Special Requests NONE  Final   Sputum evaluation   Final    Sputum specimen not acceptable for testing.  Please recollect.   NOTIFIED NURSE DAVIS AT 1300 ON 01/02/2019 BY EVA Performed at [facility], [address], [place], [place] [address]    Report Status 01/02/2019 FINAL  Final     Scheduled Meds:  amitriptyline  100 mg Oral QHS   aspirin EC  81 mg Oral Daily   atorvastatin  40 mg Oral q1800   budesonide (PULMICORT) nebulizer solution  0.5 mg Nebulization BID   Chlorhexidine Gluconate Cloth  6 each Topical Daily   finasteride  5 mg Oral q morning - 10a   furosemide  40 mg Intravenous BID   heparin  5,000 Units Subcutaneous Q8H   ipratropium-albuterol  3 mL Nebulization TID   mouth rinse  15 mL Mouth Rinse BID   metoprolol tartrate  12.5 mg Oral BID   predniSONE  60 mg Oral Q breakfast   tamsulosin  0.4 mg Oral QHS    Procedures/Studies: US Venous Img Lower Bilateral  Result Date: 01/02/2019 CLINICAL DATA:  73 year old male with lower extremity edema EXAM: BILATERAL LOWER EXTREMITY VENOUS DOPPLER ULTRASOUND TECHNIQUE: Gray-scale sonography with graded compression, as well as color Doppler and duplex ultrasound were performed to evaluate the lower extremity deep venous systems from the level of the common femoral vein and including the common femoral, femoral, profunda femoral, popliteal and calf veins including the  posterior tibial, peroneal and gastrocnemius veins when visible. The superficial great saphenous vein was also interrogated. Spectral Doppler was utilized to evaluate flow at rest and with distal augmentation maneuvers in the common femoral, femoral and popliteal veins. COMPARISON:  None. FINDINGS: RIGHT LOWER EXTREMITY Common Femoral Vein: No evidence of thrombus. Normal compressibility, respiratory phasicity and response to augmentation. Increased pulsatility of the venous waveforms. Saphenofemoral Junction: No evidence of thrombus. Normal compressibility and flow on color Doppler imaging. Profunda Femoral Vein: No evidence of thrombus. Normal compressibility and flow on color Doppler imaging. Femoral Vein: No evidence of thrombus. Normal compressibility, respiratory phasicity and response to augmentation. Popliteal Vein: No evidence of thrombus. Normal compressibility, respiratory phasicity and response to augmentation. Calf Veins: No evidence of thrombus. Normal compressibility and flow on color Doppler imaging. Superficial Great Saphenous Vein: No evidence of thrombus. Normal compressibility. Venous Reflux:  None. Other Findings:  None. LEFT LOWER EXTREMITY Common Femoral Vein: No evidence of thrombus. Normal compressibility, respiratory phasicity and response to augmentation. Increased pulsatility of the venous waveforms. Saphenofemoral Junction: No evidence of thrombus. Normal compressibility and flow  on color Doppler imaging. Profunda Femoral Vein: No evidence of thrombus. Normal compressibility and flow on color Doppler imaging. Femoral Vein: No evidence of thrombus. Normal compressibility, respiratory phasicity and response to augmentation. Popliteal Vein: No evidence of thrombus. Normal compressibility, respiratory phasicity and response to augmentation. Calf Veins: No evidence of thrombus. Normal compressibility and flow on color Doppler imaging. Superficial Great Saphenous Vein: No evidence of thrombus. Normal compressibility. Venous Reflux:  None. Other Findings:  None. IMPRESSION: 1. No evidence of deep venous thrombosis in either lower extremity. 2. Increased pulsatility of the venous waveforms suggests elevated right heart pressures. Findings can be seen in tricuspid regurgitation, right heart failure, pulmonary hypertension and COPD. Electronically Signed   By: Jacqulynn Cadet M.D.   On: 01/02/2019 14:30   Portable Chest 1 View  Result Date: 01/03/2019 CLINICAL DATA:  Shortness of breath and altered mental status. Congestive heart failure. EXAM: PORTABLE CHEST 1 VIEW COMPARISON:  01/01/2019 and 08/20/2017 FINDINGS: Stable cardiomegaly and pulmonary vascular congestion. Aortic atherosclerosis. Improved aeration of both lungs is seen. Bilateral pleural-parenchymal scarring remains stable compared to 2019. No evidence of acute infiltrate or pleural effusion. IMPRESSION: Stable cardiomegaly, pulmonary vascular congestion, and bilateral pleural-parenchymal scarring. No acute findings. Electronically Signed   By: Marlaine Hind M.D.   On: 01/03/2019 05:19   Dg Chest Port 1 View  Result Date: 01/01/2019 CLINICAL DATA:  73 year old male with shortness of breath EXAM: PORTABLE CHEST 1 VIEW COMPARISON:  Chest radiograph dated 12/15/2018 FINDINGS: There is cardiomegaly with vascular congestion. There is shallow inspiration. Small bilateral pleural effusions and bibasilar densities similar to prior  radiograph which may represent atelectasis or infiltrate. No pneumothorax. Stable cardiomegaly. No acute osseous pathology. IMPRESSION: 1. Cardiomegaly with vascular congestion. 2. Small bilateral pleural effusions and bibasilar densities similar to prior radiograph. Electronically Signed   By: Anner Crete M.D.   On: 01/01/2019 23:20   Dg Chest Port 1 View  Result Date: 12/15/2018 CLINICAL DATA:  Fever, weakness EXAM: PORTABLE CHEST 1 VIEW COMPARISON:  08/20/2017 chest radiograph. FINDINGS: Low lung volumes. Stable cardiomediastinal silhouette with moderate cardiomegaly. No pneumothorax. Calcified bilateral pleural plaques and pleural thickening, unchanged. No convincing pleural effusion. Patchy consolidation at both lung bases superimposed on chronic reticular opacities. No overt pulmonary edema. IMPRESSION: 1. Patchy consolidation superimposed on chronic reticular opacities at the lung bases, cannot exclude aspiration or pneumonia. Chest radiograph follow-up advised.  2. 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 3. Moderate cardiomegaly without overt pulmonary edema. Electronically Signed   By: Ilona Sorrel M.D.   On: 12/15/2018 18:08   Dg Knee Complete 4 Views Right  Result Date: 12/15/2018 CLINICAL DATA:  Fall, right knee pain when moving EXAM: RIGHT KNEE - COMPLETE 4+ VIEW COMPARISON:  None. FINDINGS: No fracture or dislocation. Small suprapatellar right knee joint effusion. No suspicious focal osseous lesion. Small superior right patellar enthesophyte. No significant degenerative arthropathy. Posterior vascular calcifications. No radiopaque foreign body. IMPRESSION: Small suprapatellar right knee joint effusion, with no right knee fracture or dislocation. Electronically Signed   By: Ilona Sorrel M.D.   On: 12/15/2018 18:05    Barton Dubois, MD  [facility] Pager [phone]  01/08/2019, 4:09 PM   LOS: 6 days

## 2019-01-09 NOTE — Progress Notes (Signed)
PROGRESS NOTE  Curtis [name] [id number] DOB: 1945/10/08 DOA: 01/01/2019 PCP: [name], MD  Brief History: 73 year old male with a history of pulmonary asbestosis, COPD, coronary artery disease, hypertension, hyperlipidemia, SVT status post ablation presenting with altered mental status. The patient was recently admitted to the hospital from 12/15/2018 through 12/19/2018 for sepsis secondary to pneumonia, COPD exacerbation and acute on chronic respiratory failure. He was discharged home with home health physical therapy,to finish Augmentin and prednisone taper. The patient's son states that the patient has had a generalized weakness for about a week with some confusion. This has worsened over the past 3 days to the point where the patient had not been taking any of his medications. On the day of admission, the patient was somnolent and difficult to arouse. EMS was activated, and the patient was noted to be somewhat cyanotic on his lips but arousable at that time. In the emergency room, the patient was placed on BiPAP. Chest x-ray showed vascular congestion. He was started on steroids and intravenous furosemide. The patient was afebrile hemodynamically stable with ABG initially showing 7.173/111/74 on 35%. He was placed on BiPAP. BNP was 885. Troponin was 20>>>21. Lactic acid was 1.0.  Assessment/Plan: Acute on chronic respiratory failure with hypoxia and hypercarbia -Secondary to pulmonary edema and COPD exacerbation -Personally reviewed chest x-ray--bilateral pleural plaques. Bilateral pleural effusion, right greater than left. Increased interstitial markings -Weaned off BiPAP>> currently using 3-4 L which is essentially his baseline supplementation. -Still complaining of shortness of breath with exertion; no orthopnea, no PND.[name]  COPD exacerbation -Continue Pulmicort -Continue steroids tapering, Continue duo nebs and Continue oxygen  supplementation. -Continue flutter valve.   Acute diastolic CHF -Continue lasix oral route, 40 mg daily.    -provided education to patient regarding diet and fluid intake -Echocardiogram--EF 55-60%, normal RV, indeterminant diastolic  -Daily weights and Strict I's and O's -continue low sodium diet.  Dysrhythmia/SVT/Aflutter -personally reviewed EKG--consistent with aflutter -consult cardiology in setting of prior SVT with ablation and CHF -continue metoprolol and following cardiology recommendations we will start the use of Eliquis. -Follow further recommendations. -Continue to monitor on telemetry -Follow electrolytes and replete as needed.  Urinary retention -Placed Foley catheter 8/30 -Failed voiding trial 9/1 and Foley catheter was been replaced -Second attempt for Foley discontinuation on 01/06/2019 -Continue Proscar and Flomax.  Hyperkalemia -Patient received temporizing measures -Improved/resolved with IV diuresis  Acute kidney injury -Baseline creatinine 0.9-1.2 -Presented with serum creatinine 1.8 -Continue to follow renal function trend with acute diuresis. -Essentially back to baseline At this time  Coronary artery disease -No chest pain presently -Personally reviewed EKG--sinus rhythm with nonconducted atrial beats, nonspecific ST changes -Troponins not consistent with ACS  Anxiety/depression -Continue Xanax and Elavil; Xanax dose adjusted to 3 times a day (every 8 hours as needed) for anxiety. -PMP AWARE reviewed--90 day supply of xanax 2 mg bid refilled in August 2020  Essential hypertension -Continue holding amlodipine and monitor clinically -Continue metoprolol tartrate -Heart healthy diet encouraged.  Frequent Falls -Following recommendations by PT eval; patient will need a skilled nursing facility for further care and rehabilitation. -[id number] -folic acid -[id number]  Lower extremity edema, recurrent left -Venous duplex  ruleoutDVT--neg -Swelling secondary to acute on chronic CHF.  Right and Left Heel Pressure Injury -Pursuit off load pressure -Continue secondary prevention.  Moderate protein calorie malnutrition in the setting of chronic illness -follow nutritional service recommendations for feeding supplements.   Disposition Plan:After discussing with  family member and current patient situation they had agreed to pursuit physical rehabilitation at skilled nursing facility.  Family Communication:Son updated over the phone.  Consultants:cardiology  Code Status:DNI  DVT Prophylaxis: [place] Heparin    Procedures: As Listed in Progress Note Above  Antibiotics: None   Subjective: Breathing is a stable.  No chest pain, no palpitations, no nausea, no vomiting.  No overnight events.   Objective: Vitals:   01/09/19 0754 01/09/19 0755 01/09/19 1304 01/09/19 1444  BP:    137/68  Pulse:    64  Resp:    16  Temp:    97.9 F (36.6 C)  TempSrc:    Oral  SpO2: 99% 99% 97% 98%  Weight:      Height:        Intake/Output Summary (Last 24 hours) at 01/09/2019 1451 Last data filed at 01/09/2019 1300 Gross per 24 hour  Intake 720 ml  Output 1450 ml  Net -730 ml   Weight change:   Exam: General exam: Alert, awake, oriented x 3; no chest pain, no nausea, no vomiting.  Reports breathing is a stable and at baseline.  Good oxygen saturation on 4 L nasal cannula supplementation. Respiratory system: Improved air movement bilaterally, no crackles, no wheezing; positive rhonchi's on auscultation; no using accessory muscles and with normal respiratory effort.   Cardiovascular system:Rate control, no rubs, no gallops. Gastrointestinal system: Abdomen is nondistended, soft and nontender. No organomegaly or masses felt. Normal bowel sounds heard. Central nervous system: Alert and oriented. No focal neurological deficits. Extremities/skin: No petechiae, no rash.  No open wounds and examined.   Patient with deep tissue injury in his right heel and some mild changes of tissue injury on his left heel; these changes are unchanged since examination on 01/05/2019. Psychiatry: Judgement and insight appear normal. Mood & affect appropriate.   Data Reviewed: I have personally reviewed following labs and imaging studies  Basic Metabolic Panel: Recent Labs  Lab 01/03/19 0425  01/03/19 1155 01/04/19 0525 01/05/19 0649 01/06/19 0808 01/07/19 0832  NA 139  --   --  137 134* 133* 133*  K 4.6   < > 4.9 4.1 4.1 3.9 3.6  CL 94*  --   --  90* 93* 88* 81*  CO2 35*  --   --  36* 36* 33* 39*  GLUCOSE 144*  --   --  150* 129* 138* 77  BUN 45*  --   --  45* 37* 34* 35*  CREATININE 1.32*  --   --  1.11 0.98 0.86 0.99  CALCIUM 8.6*  --   --  8.5* 8.5* 8.8* 9.0  MG 1.9  --   --  1.9 1.9  --   --    < > = values in this interval not displayed.   Liver Function Tests: No results for input(s): AST, ALT, ALKPHOS, BILITOT, PROT, ALBUMIN in the last 168 hours. CBC: No results for input(s): WBC, NEUTROABS, HGB, HCT, MCV, PLT in the last 168 hours. Urine analysis:    Component Value Date/Time   COLORURINE AMBER (A) 01/02/2019 0046   APPEARANCEUR HAZY (A) 01/02/2019 0046   LABSPEC 1.026 01/02/2019 0046   PHURINE 5.0 01/02/2019 0046   GLUCOSEU NEGATIVE 01/02/2019 0046   HGBUR NEGATIVE 01/02/2019 0046   BILIRUBINUR NEGATIVE 01/02/2019 0046   KETONESUR NEGATIVE 01/02/2019 0046   PROTEINUR 100 (A) 01/02/2019 0046   UROBILINOGEN 0.2 02/12/2015 1859   NITRITE NEGATIVE 01/02/2019 0046   LEUKOCYTESUR NEGATIVE 01/02/2019 0046  Recent Results (from the past 240 hour(s))  SARS Coronavirus 2 [facility] order, Performed in [facility] hospital lab) Nasopharyngeal Nasopharyngeal Swab     Status: None   Collection Time: 01/01/19 10:21 PM   Specimen: Nasopharyngeal Swab  Result Value Ref Range Status   SARS Coronavirus 2 NEGATIVE NEGATIVE Final    Comment: (NOTE) If result is NEGATIVE SARS-CoV-2 target  nucleic acids are NOT DETECTED. The SARS-CoV-2 RNA is generally detectable in upper and lower  respiratory specimens during the acute phase of infection. The lowest  concentration of SARS-CoV-2 viral copies this assay can detect is 250  copies / mL. A negative result does not preclude SARS-CoV-2 infection  and should not be used as the sole basis for treatment or other  patient management decisions.  A negative result may occur with  improper specimen collection / handling, submission of specimen other  than nasopharyngeal swab, presence of viral mutation(s) within the  areas targeted by this assay, and inadequate number of viral copies  (<250 copies / mL). A negative result must be combined with clinical  observations, patient history, and epidemiological information. If result is POSITIVE SARS-CoV-2 target nucleic acids are DETECTED. The SARS-CoV-2 RNA is generally detectable in upper and lower  respiratory specimens dur ing the acute phase of infection.  Positive  results are indicative of active infection with SARS-CoV-2.  Clinical  correlation with patient history and other diagnostic information is  necessary to determine patient infection status.  Positive results do  not rule out bacterial infection or co-infection with other viruses. If result is PRESUMPTIVE POSTIVE SARS-CoV-2 nucleic acids MAY BE PRESENT.   A presumptive positive result was obtained on the submitted specimen  and confirmed on repeat testing.  While 2019 novel coronavirus  (SARS-CoV-2) nucleic acids may be present in the submitted sample  additional confirmatory testing may be necessary for epidemiological  and / or clinical management purposes  to differentiate between  SARS-CoV-2 and other Sarbecovirus currently known to infect humans.  If clinically indicated additional testing with an alternate test  methodology [phone]) is advised. The SARS-CoV-2 RNA is generally  detectable in upper and lower  respiratory sp ecimens during the acute  phase of infection. The expected result is Negative. Fact Sheet for Patients:  StrictlyIdeas.no Fact Sheet for Healthcare Providers: BankingDealers.co.za This test is not yet approved or cleared by the [place] FDA and has been authorized for detection and/or diagnosis of SARS-CoV-2 by FDA under an Emergency Use Authorization (EUA).  This EUA will remain in effect (meaning this test can be used) for the duration of the COVID-19 declaration under Section 564(b)(1) of the Act, 21 U.S.C. section 360bbb-3(b)(1), unless the authorization is terminated or revoked sooner. Performed at [facility], [address] [place], [place] [address]   Urine culture     Status: Abnormal   Collection Time: 01/02/19 12:46 AM   Specimen: Urine, Catheterized  Result Value Ref Range Status   Specimen Description   Final    URINE, CATHETERIZED Performed at [facility], [address] [place], [place] [address]    Special Requests   Final    Normal Performed at [facility], [address], [place], [place] [address]    Culture (A)  Final    >=100,000 COLONIES/mL STAPHYLOCOCCUS SPECIES (COAGULASE NEGATIVE)   Report Status 01/04/2019 FINAL  Final   Organism ID, Bacteria STAPHYLOCOCCUS SPECIES (COAGULASE NEGATIVE) (A)  Final      Susceptibility   Staphylococcus species (coagulase negative) -  MIC*    CIPROFLOXACIN >=8 RESISTANT Resistant     GENTAMICIN <=0.5 SENSITIVE Sensitive     NITROFURANTOIN <=16 SENSITIVE Sensitive     OXACILLIN >=4 RESISTANT Resistant     TETRACYCLINE 2 SENSITIVE Sensitive     VANCOMYCIN 2 SENSITIVE Sensitive     TRIMETH/SULFA <=10 SENSITIVE Sensitive     CLINDAMYCIN <=0.25 SENSITIVE Sensitive     RIFAMPIN <=0.5 SENSITIVE Sensitive     Inducible Clindamycin NEGATIVE Sensitive     * >=100,000 COLONIES/mL STAPHYLOCOCCUS SPECIES (COAGULASE NEGATIVE)  MRSA PCR Screening     Status: None    Collection Time: 01/02/19  8:35 AM   Specimen: Nasopharyngeal  Result Value Ref Range Status   MRSA by PCR NEGATIVE NEGATIVE Final    Comment:        The GeneXpert MRSA Assay (FDA approved for NASAL specimens only), is one component of a comprehensive MRSA colonization surveillance program. It is not intended to diagnose MRSA infection nor to guide or monitor treatment for MRSA infections. Performed at [facility], [address], [place], [place] [address]   Culture, sputum-assessment     Status: None   Collection Time: 01/02/19  8:39 AM   Specimen: Sputum  Result Value Ref Range Status   Specimen Description SPUTUM  Final   Special Requests NONE  Final   Sputum evaluation   Final    Sputum specimen not acceptable for testing.  Please recollect.   NOTIFIED NURSE DAVIS AT 1300 ON 01/02/2019 BY EVA Performed at [facility], [address], [place], [place] [address]    Report Status 01/02/2019 FINAL  Final     Scheduled Meds:  amitriptyline  100 mg Oral QHS   aspirin EC  81 mg Oral Daily   atorvastatin  40 mg Oral q1800   budesonide (PULMICORT) nebulizer solution  0.5 mg Nebulization BID   Chlorhexidine Gluconate Cloth  6 each Topical Daily   finasteride  5 mg Oral q morning - 10a   furosemide  40 mg Oral Daily   heparin  5,000 Units Subcutaneous Q8H   ipratropium-albuterol  3 mL Nebulization TID   mouth rinse  15 mL Mouth Rinse BID   metoprolol tartrate  12.5 mg Oral BID   predniSONE  40 mg Oral Q breakfast   tamsulosin  0.4 mg Oral QHS   Procedures/Studies: US Venous Img Lower Bilateral  Result Date: 01/02/2019 CLINICAL DATA:  73 year old male with lower extremity edema EXAM: BILATERAL LOWER EXTREMITY VENOUS DOPPLER ULTRASOUND TECHNIQUE: Gray-scale sonography with graded compression, as well as color Doppler and duplex ultrasound were performed to evaluate the lower extremity deep venous systems from the level of the common femoral vein and including the  common femoral, femoral, profunda femoral, popliteal and calf veins including the posterior tibial, peroneal and gastrocnemius veins when visible. The superficial great saphenous vein was also interrogated. Spectral Doppler was utilized to evaluate flow at rest and with distal augmentation maneuvers in the common femoral, femoral and popliteal veins. COMPARISON:  None. FINDINGS: RIGHT LOWER EXTREMITY Common Femoral Vein: No evidence of thrombus. Normal compressibility, respiratory phasicity and response to augmentation. Increased pulsatility of the venous waveforms. Saphenofemoral Junction: No evidence of thrombus. Normal compressibility and flow on color Doppler imaging. Profunda Femoral Vein: No evidence of thrombus. Normal compressibility and flow on color Doppler imaging. Femoral Vein: No evidence of thrombus. Normal compressibility, respiratory phasicity and response to augmentation. Popliteal Vein: No evidence of thrombus. Normal compressibility, respiratory phasicity and response to augmentation. Calf  Veins: No evidence of thrombus. Normal compressibility and flow on color Doppler imaging. Superficial Great Saphenous Vein: No evidence of thrombus. Normal compressibility. Venous Reflux:  None. Other Findings:  None. LEFT LOWER EXTREMITY Common Femoral Vein: No evidence of thrombus. Normal compressibility, respiratory phasicity and response to augmentation. Increased pulsatility of the venous waveforms. Saphenofemoral Junction: No evidence of thrombus. Normal compressibility and flow on color Doppler imaging. Profunda Femoral Vein: No evidence of thrombus. Normal compressibility and flow on color Doppler imaging. Femoral Vein: No evidence of thrombus. Normal compressibility, respiratory phasicity and response to augmentation. Popliteal Vein: No evidence of thrombus. Normal compressibility, respiratory phasicity and response to augmentation. Calf Veins: No evidence of thrombus. Normal compressibility and flow on  color Doppler imaging. Superficial Great Saphenous Vein: No evidence of thrombus. Normal compressibility. Venous Reflux:  None. Other Findings:  None. IMPRESSION: 1. No evidence of deep venous thrombosis in either lower extremity. 2. Increased pulsatility of the venous waveforms suggests elevated right heart pressures. Findings can be seen in tricuspid regurgitation, right heart failure, pulmonary hypertension and COPD. Electronically Signed   By: Jacqulynn Cadet M.D.   On: 01/02/2019 14:30   Portable Chest 1 View  Result Date: 01/03/2019 CLINICAL DATA:  Shortness of breath and altered mental status. Congestive heart failure. EXAM: PORTABLE CHEST 1 VIEW COMPARISON:  01/01/2019 and 08/20/2017 FINDINGS: Stable cardiomegaly and pulmonary vascular congestion. Aortic atherosclerosis. Improved aeration of both lungs is seen. Bilateral pleural-parenchymal scarring remains stable compared to 2019. No evidence of acute infiltrate or pleural effusion. IMPRESSION: Stable cardiomegaly, pulmonary vascular congestion, and bilateral pleural-parenchymal scarring. No acute findings. Electronically Signed   By: Marlaine Hind M.D.   On: 01/03/2019 05:19   Dg Chest Port 1 View  Result Date: 01/01/2019 CLINICAL DATA:  73 year old male with shortness of breath EXAM: PORTABLE CHEST 1 VIEW COMPARISON:  Chest radiograph dated 12/15/2018 FINDINGS: There is cardiomegaly with vascular congestion. There is shallow inspiration. Small bilateral pleural effusions and bibasilar densities similar to prior radiograph which may represent atelectasis or infiltrate. No pneumothorax. Stable cardiomegaly. No acute osseous pathology. IMPRESSION: 1. Cardiomegaly with vascular congestion. 2. Small bilateral pleural effusions and bibasilar densities similar to prior radiograph. Electronically Signed   By: Anner Crete M.D.   On: 01/01/2019 23:20   Dg Chest Port 1 View  Result Date: 12/15/2018 CLINICAL DATA:  Fever, weakness EXAM: PORTABLE  CHEST 1 VIEW COMPARISON:  08/20/2017 chest radiograph. FINDINGS: Low lung volumes. Stable cardiomediastinal silhouette with moderate cardiomegaly. No pneumothorax. Calcified bilateral pleural plaques and pleural thickening, unchanged. No convincing pleural effusion. Patchy consolidation at both lung bases superimposed on chronic reticular opacities. No overt pulmonary edema. IMPRESSION: 1. Patchy consolidation superimposed on chronic reticular opacities at the lung bases, cannot exclude aspiration or pneumonia. Chest radiograph follow-up advised. 2. 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 3. Moderate cardiomegaly without overt pulmonary edema. Electronically Signed   By: Ilona Sorrel M.D.   On: 12/15/2018 18:08   Dg Knee Complete 4 Views Right  Result Date: 12/15/2018 CLINICAL DATA:  Fall, right knee pain when moving EXAM: RIGHT KNEE - COMPLETE 4+ VIEW COMPARISON:  None. FINDINGS: No fracture or dislocation. Small suprapatellar right knee joint effusion. No suspicious focal osseous lesion. Small superior right patellar enthesophyte. No significant degenerative arthropathy. Posterior vascular calcifications. No radiopaque foreign body. IMPRESSION: Small suprapatellar right knee joint effusion, with no right knee fracture or dislocation. Electronically Signed   By: Ilona Sorrel M.D.   On: 12/15/2018 18:05  Barton Dubois, MD  [facility] Pager [phone]  01/09/2019, 2:51 PM   LOS: 7 days

## 2019-01-10 LAB — BASIC METABOLIC PANEL
Anion gap: 9 (ref 5–15)
BUN: 38 mg/dL — ABNORMAL HIGH (ref 8–23)
CO2: 37 mmol/L — ABNORMAL HIGH (ref 22–32)
Calcium: 8.7 mg/dL — ABNORMAL LOW (ref 8.9–10.3)
Chloride: 84 mmol/L — ABNORMAL LOW (ref 98–111)
Creatinine, Ser: 0.85 mg/dL (ref 0.61–1.24)
GFR calc Af Amer: >60 mL/min (ref >60)
GFR calc non Af Amer: >60 mL/min (ref >60)
Glucose, Bld: 111 mg/dL — ABNORMAL HIGH (ref 70–99)
Potassium: 3.5 mmol/L (ref 3.5–5.1)
Sodium: 130 mmol/L — ABNORMAL LOW (ref 135–145)

## 2019-01-10 MED ORDER — PREDNISONE 20 MG PO TABS
20.0000 mg | ORAL_TABLET | Freq: Every day | ORAL | Status: DC
  Administered 2019-01-11: 20 mg via ORAL
  Filled 2019-01-10 (×2): qty 1

## 2019-01-10 NOTE — Progress Notes (Signed)
PROGRESS NOTE  Curtis [name] [id number] DOB: [birth year] DOA: 01/01/2019 PCP: [name], MD  Brief History: 73 year old male with a history of pulmonary asbestosis, COPD, coronary artery disease, hypertension, hyperlipidemia, SVT status post ablation presenting with altered mental status. The patient was recently admitted to the hospital from 12/15/2018 through 12/19/2018 for sepsis secondary to pneumonia, COPD exacerbation and acute on chronic respiratory failure. He was discharged home with home health physical therapy,to finish Augmentin and prednisone taper. The patient's son states that the patient has had a generalized weakness for about a week with some confusion. This has worsened over the past 3 days to the point where the patient had not been taking any of his medications. On the day of admission, the patient was somnolent and difficult to arouse. EMS was activated, and the patient was noted to be somewhat cyanotic on his lips but arousable at that time. In the emergency room, the patient was placed on BiPAP. Chest x-ray showed vascular congestion. He was started on steroids and intravenous furosemide. The patient was afebrile hemodynamically stable with ABG initially showing 7.173/111/74 on 35%. He was placed on BiPAP. BNP was 885. Troponin was 20>>>21. Lactic acid was 1.0.  Assessment/Plan: Acute on chronic respiratory failure with hypoxia and hypercarbia -Secondary to pulmonary edema and COPD exacerbation -Personally reviewed chest x-ray--bilateral pleural plaques. Bilateral pleural effusion, right greater than left. Increased interstitial markings -Weaned off BiPAP>> currently using 3-4 L which is essentially his baseline supplementation. -Still complaining of shortness of breath with exertion; no orthopnea, no PND.[name]  COPD exacerbation -Continue Pulmicort -Continue steroids tapering, Continue duo nebs and Continue oxygen  supplementation. -Continue flutter valve.   Acute diastolic CHF -Continue lasix oral route, 40 mg daily.    -provided education to patient regarding diet and fluid intake -Echocardiogram--EF 55-60%, normal RV, indeterminant diastolic  -Daily weights and Strict I's and O's -continue low sodium diet.  Dysrhythmia/SVT/Aflutter -personally reviewed EKG--consistent with aflutter -consult cardiology in setting of prior SVT with ablation and CHF -continue metoprolol and following cardiology recommendations we will start the use of Eliquis. -Follow further recommendations. -Continue to monitor on telemetry -Follow electrolytes and replete as needed.  Urinary retention -Placed Foley catheter 8/30 -Failed voiding trial 9/1 and Foley catheter was been replaced -Second attempt for Foley discontinuation on 01/06/2019 -Continue Proscar and Flomax.  Hyperkalemia -Patient received temporizing measures -Improved/resolved with IV diuresis  Acute kidney injury -Baseline creatinine 0.9-1.2 -Presented with serum creatinine 1.8 -Continue to follow renal function trend with acute diuresis. -Essentially back to baseline At this time  Coronary artery disease -No chest pain presently -Personally reviewed EKG--sinus rhythm with nonconducted atrial beats, nonspecific ST changes -Troponins not consistent with ACS  Anxiety/depression -Continue Xanax and Elavil; Xanax dose adjusted to 3 times a day (every 8 hours as needed) for anxiety. -PMP AWARE reviewed--90 day supply of xanax 2 mg bid refilled in August 2020  Essential hypertension -Continue holding amlodipine and monitor clinically -Continue metoprolol tartrate -Heart healthy diet encouraged.  Frequent Falls -Following recommendations by PT eval; patient will need a skilled nursing facility for further care and rehabilitation. -[id number] -folic acid -[id number]  Lower extremity edema, recurrent left -Venous duplex  ruleoutDVT--neg -Swelling secondary to acute on chronic CHF.  Right and Left Heel Pressure Injury -Pursuit off load pressure -Continue secondary prevention.  Moderate protein calorie malnutrition in the setting of chronic illness -follow nutritional service recommendations for feeding supplements.   Disposition Plan:After discussing with  family member and current patient situation they had agreed to pursuit physical rehabilitation at skilled nursing facility.  Family Communication:Son updated over the phone.  Consultants:cardiology  Code Status:DNI  DVT Prophylaxis: Arma Heparin    Procedures: As Listed in Progress Note Above  Antibiotics: None   Subjective: Breathing is a stable and at baseline.  No chest pain, no nausea, no vomiting.  Patient denies chest pain and expressed no palpitations.  No overnight events.   Objective: Vitals:   01/09/19 2142 01/10/19 0400 01/10/19 0522 01/10/19 0734  BP: 137/75  115/68   Pulse: 85  74   Resp: 16 18    Temp: 98.3 F (36.8 C)  98 F (36.7 C)   TempSrc: Oral  Oral   SpO2: 97% 98% 99% 95%  Weight:      Height:        Intake/Output Summary (Last 24 hours) at 01/10/2019 1121 Last data filed at 01/10/2019 [id number] Gross per 24 hour  Intake 720 ml  Output 1880 ml  Net -1160 ml   Weight change:   Exam: General exam: Alert, awake, oriented x 3; in no acute distress, denies nausea, no vomiting.  Patient in good spirits this morning.  No overnight events. Respiratory system: Appropriate movement bilaterally.  No crackles, no wheezing; patient reports breathing is at baseline.  Good oxygen saturation on 4 L nasal cannula supplementation. Cardiovascular system:Rate controlled, no rubs, no gallops. Gastrointestinal system: Abdomen is nondistended, soft and nontender. No organomegaly or masses felt. Normal bowel sounds heard. Central nervous system: Alert and oriented. No focal neurological deficits. Extremities/skin: No  cyanosis or clubbing.  No open wounds.  Deep tissue injury in right heel and some mild changes of a skin injury on his left heel.  Changes are unchanged since examination on 01/05/2019. Psychiatry: Judgement and insight appear normal. Mood & affect appropriate.    Data Reviewed: I have personally reviewed following labs and imaging studies  Basic Metabolic Panel: Recent Labs  Lab 01/04/19 0525 01/05/19 0649 01/06/19 0808 01/07/19 0832 01/10/19 0511  NA 137 134* 133* 133* 130*  K 4.1 4.1 3.9 3.6 3.5  CL 90* 93* 88* 81* 84*  CO2 36* 36* 33* 39* 37*  GLUCOSE 150* 129* 138* 77 111*  BUN 45* 37* 34* 35* 38*  CREATININE 1.11 0.98 0.86 0.99 0.85  CALCIUM 8.5* 8.5* 8.8* 9.0 8.7*  MG 1.9 1.9  --   --   --    Urine analysis:    Component Value Date/Time   COLORURINE AMBER (A) 01/02/2019 0046   APPEARANCEUR HAZY (A) 01/02/2019 0046   LABSPEC 1.026 01/02/2019 0046   PHURINE 5.0 01/02/2019 0046   GLUCOSEU NEGATIVE 01/02/2019 0046   HGBUR NEGATIVE 01/02/2019 0046   BILIRUBINUR NEGATIVE 01/02/2019 0046   KETONESUR NEGATIVE 01/02/2019 0046   PROTEINUR 100 (A) 01/02/2019 0046   UROBILINOGEN 0.2 02/12/2015 1859   NITRITE NEGATIVE 01/02/2019 0046   LEUKOCYTESUR NEGATIVE 01/02/2019 0046    Recent Results (from the past 240 hour(s))  SARS Coronavirus 2 [facility] order, Performed in [facility] hospital lab) Nasopharyngeal Nasopharyngeal Swab     Status: None   Collection Time: 01/01/19 10:21 PM   Specimen: Nasopharyngeal Swab  Result Value Ref Range Status   SARS Coronavirus 2 NEGATIVE NEGATIVE Final    Comment: (NOTE) If result is NEGATIVE SARS-CoV-2 target nucleic acids are NOT DETECTED. The SARS-CoV-2 RNA is generally detectable in upper and lower  respiratory specimens during the acute phase of infection. The lowest  concentration of SARS-CoV-2 viral copies this assay can detect is 250  copies / mL. A negative result does not preclude SARS-CoV-2 infection  and should not be used  as the sole basis for treatment or other  patient management decisions.  A negative result may occur with  improper specimen collection / handling, submission of specimen other  than nasopharyngeal swab, presence of viral mutation(s) within the  areas targeted by this assay, and inadequate number of viral copies  (<250 copies / mL). A negative result must be combined with clinical  observations, patient history, and epidemiological information. If result is POSITIVE SARS-CoV-2 target nucleic acids are DETECTED. The SARS-CoV-2 RNA is generally detectable in upper and lower  respiratory specimens dur ing the acute phase of infection.  Positive  results are indicative of active infection with SARS-CoV-2.  Clinical  correlation with patient history and other diagnostic information is  necessary to determine patient infection status.  Positive results do  not rule out bacterial infection or co-infection with other viruses. If result is PRESUMPTIVE POSTIVE SARS-CoV-2 nucleic acids MAY BE PRESENT.   A presumptive positive result was obtained on the submitted specimen  and confirmed on repeat testing.  While 2019 novel coronavirus  (SARS-CoV-2) nucleic acids may be present in the submitted sample  additional confirmatory testing may be necessary for epidemiological  and / or clinical management purposes  to differentiate between  SARS-CoV-2 and other Sarbecovirus currently known to infect humans.  If clinically indicated additional testing with an alternate test  methodology [phone]) is advised. The SARS-CoV-2 RNA is generally  detectable in upper and lower respiratory sp ecimens during the acute  phase of infection. The expected result is Negative. Fact Sheet for Patients:  StrictlyIdeas.no Fact Sheet for Healthcare Providers: BankingDealers.co.za This test is not yet approved or cleared by the [place] FDA and has been authorized for  detection and/or diagnosis of SARS-CoV-2 by FDA under an Emergency Use Authorization (EUA).  This EUA will remain in effect (meaning this test can be used) for the duration of the COVID-19 declaration under Section 564(b)(1) of the Act, 21 U.S.C. section 360bbb-3(b)(1), unless the authorization is terminated or revoked sooner. Performed at [facility], [address], [place], [place] [address]   Urine culture     Status: Abnormal   Collection Time: 01/02/19 12:46 AM   Specimen: Urine, Catheterized  Result Value Ref Range Status   Specimen Description   Final    URINE, CATHETERIZED Performed at [facility], [address], [place], [place] [address]    Special Requests   Final    Normal Performed at [facility], [address] [place], [place] [address]    Culture (A)  Final    >=100,000 COLONIES/mL STAPHYLOCOCCUS SPECIES (COAGULASE NEGATIVE)   Report Status 01/04/2019 FINAL  Final   Organism ID, Bacteria STAPHYLOCOCCUS SPECIES (COAGULASE NEGATIVE) (A)  Final      Susceptibility   Staphylococcus species (coagulase negative) - MIC*    CIPROFLOXACIN >=8 RESISTANT Resistant     GENTAMICIN <=0.5 SENSITIVE Sensitive     NITROFURANTOIN <=16 SENSITIVE Sensitive     OXACILLIN >=4 RESISTANT Resistant     TETRACYCLINE 2 SENSITIVE Sensitive     VANCOMYCIN 2 SENSITIVE Sensitive     TRIMETH/SULFA <=10 SENSITIVE Sensitive     CLINDAMYCIN <=0.25 SENSITIVE Sensitive     RIFAMPIN <=0.5 SENSITIVE Sensitive     Inducible Clindamycin NEGATIVE Sensitive     * >=100,000 COLONIES/mL STAPHYLOCOCCUS SPECIES (COAGULASE NEGATIVE)  MRSA PCR Screening     Status: None   Collection Time: 01/02/19  8:35 AM   Specimen: Nasopharyngeal  Result Value Ref Range Status   MRSA by PCR NEGATIVE NEGATIVE Final    Comment:        The GeneXpert MRSA Assay (FDA approved for NASAL specimens only), is one component of a comprehensive MRSA colonization surveillance program. It is not intended to diagnose  MRSA infection nor to guide or monitor treatment for MRSA infections. Performed at [facility], [address] [place], [place] [address]   Culture, sputum-assessment     Status: None   Collection Time: 01/02/19  8:39 AM   Specimen: Sputum  Result Value Ref Range Status   Specimen Description SPUTUM  Final   Special Requests NONE  Final   Sputum evaluation   Final    Sputum specimen not acceptable for testing.  Please recollect.   NOTIFIED NURSE DAVIS AT 1300 ON 01/02/2019 BY EVA Performed at [facility], [address] [place], [place] [address]    Report Status 01/02/2019 FINAL  Final     Scheduled Meds:  amitriptyline  100 mg Oral QHS   aspirin EC  81 mg Oral Daily   atorvastatin  40 mg Oral q1800   budesonide (PULMICORT) nebulizer solution  0.5 mg Nebulization BID   Chlorhexidine Gluconate Cloth  6 each Topical Daily   finasteride  5 mg Oral q morning - 10a   furosemide  40 mg Oral Daily   heparin  5,000 Units Subcutaneous Q8H   ipratropium-albuterol  3 mL Nebulization TID   mouth rinse  15 mL Mouth Rinse BID   metoprolol tartrate  12.5 mg Oral BID   predniSONE  40 mg Oral Q breakfast   tamsulosin  0.4 mg Oral QHS   Procedures/Studies: US Venous Img Lower Bilateral  Result Date: 01/02/2019 CLINICAL DATA:  73 year old male with lower extremity edema EXAM: BILATERAL LOWER EXTREMITY VENOUS DOPPLER ULTRASOUND TECHNIQUE: Gray-scale sonography with graded compression, as well as color Doppler and duplex ultrasound were performed to evaluate the lower extremity deep venous systems from the level of the common femoral vein and including the common femoral, femoral, profunda femoral, popliteal and calf veins including the posterior tibial, peroneal and gastrocnemius veins when visible. The superficial great saphenous vein was also interrogated. Spectral Doppler was utilized to evaluate flow at rest and with distal augmentation maneuvers in the common femoral, femoral  and popliteal veins. COMPARISON:  None. FINDINGS: RIGHT LOWER EXTREMITY Common Femoral Vein: No evidence of thrombus. Normal compressibility, respiratory phasicity and response to augmentation. Increased pulsatility of the venous waveforms. Saphenofemoral Junction: No evidence of thrombus. Normal compressibility and flow on color Doppler imaging. Profunda Femoral Vein: No evidence of thrombus. Normal compressibility and flow on color Doppler imaging. Femoral Vein: No evidence of thrombus. Normal compressibility, respiratory phasicity and response to augmentation. Popliteal Vein: No evidence of thrombus. Normal compressibility, respiratory phasicity and response to augmentation. Calf Veins: No evidence of thrombus. Normal compressibility and flow on color Doppler imaging. Superficial Great Saphenous Vein: No evidence of thrombus. Normal compressibility. Venous Reflux:  None. Other Findings:  None. LEFT LOWER EXTREMITY Common Femoral Vein: No evidence of thrombus. Normal compressibility, respiratory phasicity and response to augmentation. Increased pulsatility of the venous waveforms. Saphenofemoral Junction: No evidence of thrombus. Normal compressibility and flow on color Doppler imaging. Profunda Femoral Vein: No evidence of thrombus. Normal compressibility and flow on color Doppler imaging. Femoral Vein: No evidence of thrombus. Normal compressibility,  respiratory phasicity and response to augmentation. Popliteal Vein: No evidence of thrombus. Normal compressibility, respiratory phasicity and response to augmentation. Calf Veins: No evidence of thrombus. Normal compressibility and flow on color Doppler imaging. Superficial Great Saphenous Vein: No evidence of thrombus. Normal compressibility. Venous Reflux:  None. Other Findings:  None. IMPRESSION: 1. No evidence of deep venous thrombosis in either lower extremity. 2. Increased pulsatility of the venous waveforms suggests elevated right heart pressures. Findings  can be seen in tricuspid regurgitation, right heart failure, pulmonary hypertension and COPD. Electronically Signed   By: Jacqulynn Cadet M.D.   On: 01/02/2019 14:30   Portable Chest 1 View  Result Date: 01/03/2019 CLINICAL DATA:  Shortness of breath and altered mental status. Congestive heart failure. EXAM: PORTABLE CHEST 1 VIEW COMPARISON:  01/01/2019 and 08/20/2017 FINDINGS: Stable cardiomegaly and pulmonary vascular congestion. Aortic atherosclerosis. Improved aeration of both lungs is seen. Bilateral pleural-parenchymal scarring remains stable compared to 2019. No evidence of acute infiltrate or pleural effusion. IMPRESSION: Stable cardiomegaly, pulmonary vascular congestion, and bilateral pleural-parenchymal scarring. No acute findings. Electronically Signed   By: Marlaine Hind M.D.   On: 01/03/2019 05:19   Dg Chest Port 1 View  Result Date: 01/01/2019 CLINICAL DATA:  73 year old male with shortness of breath EXAM: PORTABLE CHEST 1 VIEW COMPARISON:  Chest radiograph dated 12/15/2018 FINDINGS: There is cardiomegaly with vascular congestion. There is shallow inspiration. Small bilateral pleural effusions and bibasilar densities similar to prior radiograph which may represent atelectasis or infiltrate. No pneumothorax. Stable cardiomegaly. No acute osseous pathology. IMPRESSION: 1. Cardiomegaly with vascular congestion. 2. Small bilateral pleural effusions and bibasilar densities similar to prior radiograph. Electronically Signed   By: Anner Crete M.D.   On: 01/01/2019 23:20   Dg Chest Port 1 View  Result Date: 12/15/2018 CLINICAL DATA:  Fever, weakness EXAM: PORTABLE CHEST 1 VIEW COMPARISON:  08/20/2017 chest radiograph. FINDINGS: Low lung volumes. Stable cardiomediastinal silhouette with moderate cardiomegaly. No pneumothorax. Calcified bilateral pleural plaques and pleural thickening, unchanged. No convincing pleural effusion. Patchy consolidation at both lung bases superimposed on chronic  reticular opacities. No overt pulmonary edema. IMPRESSION: 1. Patchy consolidation superimposed on chronic reticular opacities at the lung bases, cannot exclude aspiration or pneumonia. Chest radiograph follow-up advised. 2. 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 3. Moderate cardiomegaly without overt pulmonary edema. Electronically Signed   By: Ilona Sorrel M.D.   On: 12/15/2018 18:08   Dg Knee Complete 4 Views Right  Result Date: 12/15/2018 CLINICAL DATA:  Fall, right knee pain when moving EXAM: RIGHT KNEE - COMPLETE 4+ VIEW COMPARISON:  None. FINDINGS: No fracture or dislocation. Small suprapatellar right knee joint effusion. No suspicious focal osseous lesion. Small superior right patellar enthesophyte. No significant degenerative arthropathy. Posterior vascular calcifications. No radiopaque foreign body. IMPRESSION: Small suprapatellar right knee joint effusion, with no right knee fracture or dislocation. Electronically Signed   By: Ilona Sorrel M.D.   On: 12/15/2018 18:05    Barton Dubois, MD  [facility] Pager [phone]  01/10/2019, 11:21 AM   LOS: 8 days

## 2019-01-10 NOTE — Care Management Important Message (Signed)
Important Message  Patient Details  Name: Curtis [name] MRN: [id number] Date of Birth: 11-Jan-1946   Medicare Important Message Given:  Yes     Tommy Medal 01/10/2019, 10:21 AM

## 2019-01-10 NOTE — Progress Notes (Signed)
Went to patients room to place him on BIPAP. Patient says he is not ready to go on it and will have nurse call if and when he is ready to be placed on BIPAP. Patient is on 4LNC with 02 saturations at 98% and in no distress at this time.

## 2019-01-10 NOTE — Progress Notes (Signed)
Patient states he will let RN know when ready for CPAP. RN aware.

## 2019-01-11 ENCOUNTER — Inpatient Hospital Stay
Admission: RE | Admit: 2019-01-11 | Discharge: 2019-02-08 | Disposition: A | Discharge status: Other Acute Inpatient Hospital | Payer: Medicare Other | Source: Ambulatory Visit | Attending: Internal Medicine | Admitting: Internal Medicine

## 2019-01-11 DIAGNOSIS — R059 Cough, unspecified: Secondary | ICD-10-CM

## 2019-01-11 DIAGNOSIS — I4891 Unspecified atrial fibrillation: Secondary | ICD-10-CM

## 2019-01-11 DIAGNOSIS — L97409 Non-pressure chronic ulcer of unspecified heel and midfoot with unspecified severity: Secondary | ICD-10-CM

## 2019-01-11 DIAGNOSIS — R509 Fever, unspecified: Principal | ICD-10-CM

## 2019-01-11 DIAGNOSIS — R05 Cough: Secondary | ICD-10-CM

## 2019-01-11 DIAGNOSIS — I5033 Acute on chronic diastolic (congestive) heart failure: Secondary | ICD-10-CM

## 2019-01-11 DIAGNOSIS — J441 Chronic obstructive pulmonary disease with (acute) exacerbation: Secondary | ICD-10-CM

## 2019-01-11 DIAGNOSIS — I2510 Atherosclerotic heart disease of native coronary artery without angina pectoris: Secondary | ICD-10-CM

## 2019-01-11 DIAGNOSIS — R5381 Other malaise: Secondary | ICD-10-CM

## 2019-01-11 DIAGNOSIS — R748 Abnormal levels of other serum enzymes: Secondary | ICD-10-CM

## 2019-01-11 MED ORDER — FUROSEMIDE 40 MG PO TABS: 40.0000 mg | ORAL_TABLET | Freq: Every day | ORAL | Status: DC

## 2019-01-11 MED ORDER — PREDNISONE 20 MG PO TABS: ORAL_TABLET | ORAL | Status: DC

## 2019-01-11 NOTE — Plan of Care (Signed)

## 2019-01-11 NOTE — Progress Notes (Signed)
Pt report called to Cristal Deer, LPN at the [facility].

## 2019-01-11 NOTE — Progress Notes (Signed)
Pt discharged to [facility] via wheelchair accompanied by [facility] staff member.

## 2019-01-11 NOTE — TOC Transition Note (Signed)
Transition of Care [facility]) - CM/SW Discharge Note   Patient Details  Name: Curtis [name] MRN: [id number] Date of Birth: 1945/11/06  [facility]) CM/SW Contact:  Boneta Lucks, RN Phone Number: 01/11/2019, 10:41 AM   Clinical Narrative:   Kristin Bruins from [facility] called they have Insurance Auth and bed ready.  Patient is medically ready.  Updated Curtis [name] -son for transfer and RN to call report.     Final next level of care: Skilled Nursing Facility Barriers to Discharge: No Barriers Identified   Patient Goals and CMS Choice Patient states their goals for this hospitalization and ongoing recovery are:: return home today CMS Medicare.gov Compare Post Acute Care list provided to:: Patient(and son - Curtis [name]) Choice offered to / list presented to : Patient, Adult Children  Discharge Placement              Patient chooses bed at: [facility] Patient to be transferred to facility by: [facility] staff Name of family member notified: Curtis [name] - son Patient and family notified of of transfer: 01/11/19  Discharge Plan and Services   Discharge Planning Services: Follow-up appt scheduled Post Acute Care Choice: Home Health                    HH Arranged: PT, RN   Date [facility] Agency Contacted: 01/04/19 Time Justice: 1136    Social Determinants of Health (SDOH) Interventions     Readmission Risk Interventions Readmission Risk Prevention Plan 01/04/2019 12/19/2018  Transportation Screening Complete Complete  PCP or Specialist Appt within 5-7 Days - Complete  PCP or Specialist Appt within 3-5 Days Complete -  Home Care Screening - Complete  Medication Review (RN CM) - Complete  HRI or Home Care Consult Complete -  Social Work Consult for [place] Planning/Counseling Complete -  Palliative Care Screening Not Applicable -  Medication Review Press photographer) Complete -  Some recent data might be hidden

## 2019-01-11 NOTE — Progress Notes (Addendum)
Progress Note  Patient Name: Curtis [name] Date of Encounter: 01/11/2019  Primary Cardiologist: Kate Sable, MD   Subjective   No chest pain or SOB  Inpatient Medications    Scheduled Meds: . amitriptyline  100 mg Oral QHS  . aspirin EC  81 mg Oral Daily  . atorvastatin  40 mg Oral q1800  . budesonide (PULMICORT) nebulizer solution  0.5 mg Nebulization BID  . Chlorhexidine Gluconate Cloth  6 each Topical Daily  . finasteride  5 mg Oral q morning - 10a  . furosemide  40 mg Oral Daily  . heparin  5,000 Units Subcutaneous Q8H  . ipratropium-albuterol  3 mL Nebulization TID  . mouth rinse  15 mL Mouth Rinse BID  . metoprolol tartrate  12.5 mg Oral BID  . predniSONE  20 mg Oral Q breakfast  . tamsulosin  0.4 mg Oral QHS   Continuous Infusions:  PRN Meds: acetaminophen **OR** acetaminophen, ALPRAZolam, bisacodyl, bisacodyl, ipratropium-albuterol, nitroGLYCERIN, ondansetron **OR** ondansetron (ZOFRAN) IV, traMADol   Vital Signs    Vitals:   01/11/19 0500 01/11/19 0553 01/11/19 0743 01/11/19 0757  BP:  (!) 121/59    Pulse:  76    Resp:  18    Temp:  98 F (36.7 C)    TempSrc:  Oral    SpO2:  99% 99% 100%  Weight: 84.4 kg     Height:        Intake/Output Summary (Last 24 hours) at 01/11/2019 0901 Last data filed at 01/11/2019 0548 Gross per 24 hour  Intake -  Output 1350 ml  Net -1350 ml   Last 3 Weights 01/11/2019 01/09/2019 01/07/2019  Weight (lbs) 186 lb 1.1 oz 187 lb 13.3 oz 198 lb 6.6 oz  Weight (kg) 84.4 kg 85.2 kg 90 kg      Telemetry    A flutter rate controlled  - Personally Reviewed  ECG    No new - Personally Reviewed  Physical Exam   GEN: No acute distress.   Neck: No JVD Cardiac: RRR mostly in a flutter, no murmurs, rubs, or gallops.  Respiratory: + rhonchi to auscultation bilaterally. No rales GI: Soft, nontender, non-distended  MS: No edema; No deformity. Neuro:  Nonfocal  Psych: Normal affect   Labs    High Sensitivity Troponin:    Recent Labs  Lab 01/01/19 2235 01/02/19 0100  TROPONINIHS 20* 21*      Chemistry Recent Labs  Lab 01/06/19 0808 01/07/19 0832 01/10/19 0511  NA 133* 133* 130*  K 3.9 3.6 3.5  CL 88* 81* 84*  CO2 33* 39* 37*  GLUCOSE 138* 77 111*  BUN 34* 35* 38*  CREATININE 0.86 0.99 0.85  CALCIUM 8.8* 9.0 8.7*  GFRNONAA >60 >60 >60  GFRAA >60 >60 >60  ANIONGAP 12 13 9      HematologyNo results for input(s): WBC, RBC, HGB, HCT, MCV, MCH, MCHC, RDW, PLT in the last 168 hours.  BNPNo results for input(s): BNP, PROBNP in the last 168 hours.   DDimer No results for input(s): DDIMER in the last 168 hours.   Radiology    No results found.  Cardiac Studies    Echocardiogram: 01/02/2019 IMPRESSIONS  1. The left ventricle has normal systolic function, with an ejection fraction of 55-60%. The cavity size was normal. There is mildly increased left ventricular wall thickness. Left ventricular diastolic Doppler parameters are indeterminate. 2. The right ventricle has normal systolic function. The cavity was normal. There is no increase in right  ventricular wall thickness. Right ventricular systolic pressure could not be assessed. 3. No evidence of mitral valve stenosis. 4. The aortic valve is tricuspid. Mild thickening of the aortic valve. Mild calcification of the aortic valve. No stenosis of the aortic valve. 5. The aorta is normal unless otherwise noted. 6. The ascending aorta is normal in size and structure.  Patient Profile     73 y.o. 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 Cardiology consulted due to new-onset atrial flutter.   Assessment & Plan    1. New-onset Atrial Flutter - New diagnosis for  the patient this admission and he is overall unaware of his arrhythmia. Rates remain stable in the 80's. Will continue with Lopressor at current dosing. - He has been started on Eliquis 5 mg twice daily with ASA being discontinued. Would consider DCCV in 3 to 4 weeks if he has not converted back to normal sinus rhythm. Tried to discuss possible cardioversion with the patient today but he is mostly focused on if he will have to go to rehab at discharge. Would again address at follow-up.  2. Acute on Chronic Diastolic CHF Exacerbation -Presented with worsening dyspnea on exertion, orthopnea, and lower extremity edema with BNP elevated to 885 and CXR showing small bilateral effusions.Echo showsa preserved EF of 55 to 60%. He has responded well to IV Lasix and is overall -8.7 L thus far. Received PO Metolazone 2.5mg  with last dose 01/06/19 by the admitting team and follow-up labs remain stable with creatinine at 0.85 and K+ 3.5. Weight down to 185.6 lbs (patient reported previous baseline of 215 lbs so will need to establish new dry weight). Lasix changed to 40 po daily yesterday.  Was not on diuretic prior to admit.   3. CAD  -s/p cath in 2015 showing occluded RCA and high-grade LCx stenosis treated with DESx2 to LCx. - Cardiac enzymes have been negative this admission and echocardiogram shows a preserved EF with no regional wall motion abnormalities. No plans for further ischemic testing at this time. ASA has been discontinued given the need for anticoagulation. Continue statin and beta-blocker therapy  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 No recurrent SVT by telemetry review. Remains on Lopressor 12.5 mg twice daily.  5. HLD - He has been continued on PTA Atorvastatin.  6. COPD - He reports improvement in his respiratory status since admission. Currently on 4 L nasal cannula with saturations in the 90's. Further treatment per admitting team.       For questions or updates,  please contact [place] Please consult [email or web address] for contact info under        Signed, Cecilie Kicks, NP  01/11/2019, 9:01 AM    Attending note  Patient seen and discussed with PA Dorene Ar, I agree with her documentation. Patient arranged to be dicsharged this AM, agree from cardiac standpoint. Continue current cardaic regimen, we will arrange outpatient follow up.   Zandra Abts MD

## 2019-01-11 NOTE — Discharge Summary (Signed)
Physician Discharge Summary  Curtis [name] [id number] DOB: [birth year] DOA: 01/01/2019  PCP: [name], MD  Admit date: 01/01/2019 Discharge date: 01/11/2019  Time spent: 35 minutes  Recommendations for Outpatient Follow-up:  1. Repeat basic metabolic panel to follow electrolytes and renal function in 5 days 2. Reassess blood pressure and further adjust antihypertensive regimen as needed   Discharge Diagnoses:  AKI (acute kidney injury) ([place]) COPD exacerbation ([place]) Acute on chronic respiratory failure with hypoxia and hypercapnia ([facility]) Acute on chronic diastolic CHF (congestive heart failure) ([facility]) Arrythmia/SVT/atrial flutter Hyperkalemia Pressure injury of skin   Discharge Condition: Stable and improved.  Patient discharged to [facility] for further rehabilitation and care.  Outpatient follow-up with cardiology service and with PCP as instructed.  Diet recommendation: Heart healthy diet  Filed Weights   01/07/19 0456 01/09/19 0509 01/11/19 0500  Weight: 90 kg 85.2 kg 84.4 kg    History of present illness:  73 year old male with a history of pulmonary asbestosis, COPD, coronary artery disease, hypertension, hyperlipidemia, SVT status post ablation presenting with altered mental status. The patient was recently admitted to the hospital from 12/15/2018 through 12/19/2018 for sepsis secondary to pneumonia, COPD exacerbation and acute on chronic respiratory failure. He was discharged home with home health physical therapy,to finish Augmentin and prednisone taper. The patient's son states that the patient has had a generalized weakness for about a week with some confusion. This has worsened over the past 3 days to the point where the patient had not been taking any of his medications. On the day of admission, the patient was somnolent and difficult to arouse. EMS was activated, and the patient was noted to be somewhat cyanotic on his lips but arousable at that time. In the  emergency room, the patient was placed on BiPAP. Chest x-ray showed vascular congestion. He was started on steroids and intravenous furosemide. The patient was afebrile hemodynamically stable with ABG initially showing 7.173/111/74 on 35%. He was placed on BiPAP. BNP was 885. Troponin was 20>>>21. Lactic acid was 1.0.  Hospital Course:  Acute on chronic respiratory failure with hypoxia and hypercarbia -Secondary to pulmonary edema and COPD exacerbation -Personally reviewed chest x-ray--bilateral pleural plaques. Bilateral pleural effusion, right greater than left. Increased interstitial markings -Weanedoff BiPAP>> currently using 3-4 L which is essentially his chronic baseline supplementation. -Improved air movement bilaterally, denies shortness of breath, no orthopnea, no PND.[name]  COPD exacerbation -ContinuePulmicort -Continuesteroids tapering, resume home inhalers/nebulizer regimen and Continue oxygen supplementation. -Continue the use of flutter valve.   Acutediastolic CHF -Continue lasix oral route, 40 mg daily.    -provided education to patient regarding diet and fluid intake -Echocardiogram--EF 55-60%, normal RV, indeterminant diastolic function. -Daily weights and continue low sodium diet.  Dysrhythmia/SVT/Aflutter -personally reviewed EKG--consistent with aflutter -consult cardiology in setting of prior SVT with ablation and CHF -continue metoprolol and following cardiology recommendations we will start the use of Eliquis. -Follow outpatient follow-up with cardiology service for further recommendations. -Follow electrolytes and replete as needed.  Goal is for a potassium of 4 and magnesium of 2.  Urinary retention -PlacedFoley catheter 8/30 -Failed voiding trial 9/1 and Foley catheter was replaced -Second attempt for Foley discontinuation on 01/06/2019 also failed; patient will be discharged with Foley catheter in place and outpatient follow-up with  urology. -Continue Proscar and Flomax.  Hyperkalemia -Patient received temporizing measures -Improved/resolved with IV diuresis -Repeat basic metabolic panel to follow electrolytes trend.  Acute kidney injury -Baseline creatinine 0.9-1.2 -Presented with serum creatinine 1.8 -Renal  function back to baseline At this time -Repeat basic metabolic panel to follow-up renal function and stability.  Coronary artery disease -No chest pain presently -Personally reviewed EKG--sinus rhythm with nonconducted atrial beats, nonspecific ST changes -Troponin not consistent with ACS  Anxiety/depression -Continue Xanax and Elavil -PMP AWARE reviewed--90 day supply of xanax 2 mg bid refilled in August 2020 -stable mood.  Essential hypertension -Continue holding amlodipine at discharge -Continue metoprolol tartrate and lasix -Heart healthy diet encouraged. -follow BP and adjust antihypertensive regimen as needed.  Frequent Falls -Following recommendations by PT eval; patient will need a skilled nursing facility for further care and rehabilitation. -Patient will be discharged to [facility] -[id number] -folic acid -[id number]  Lower extremity edema, recurrent left -Venous duplex ruleoutDVT--neg -Swelling secondary to acute on chronic CHF. -stable at discharge.  Right and Left Heel Pressure Injury -continue to Pursuit off load pressure -Continue secondary prevention.  Moderate protein calorie malnutrition in the setting of chronic illness -follow nutritional service recommendations for feeding supplements.   Procedures:  See below for x-ray report  2D echo  Consultations:  Cardiology service  Discharge Exam: Vitals:   01/11/19 0743 01/11/19 0757  BP:    Pulse:    Resp:    Temp:    SpO2: 99% 100%   General exam: Alert, awake, oriented x 3; in no acute distress, denies nausea, no vomiting.  Patient in good spirits this morning.  No overnight events. Respiratory  system: Appropriate movement bilaterally.  No crackles, no wheezing; patient reports breathing is at baseline.  Good oxygen saturation on 4 L nasal cannula supplementation. Cardiovascular system:Rate controlled, no rubs, no gallops. Gastrointestinal system: Abdomen is nondistended, soft and nontender. No organomegaly or masses felt. Normal bowel sounds heard. Central nervous system: Alert and oriented. No focal neurological deficits. Extremities/skin: No cyanosis or clubbing.  No open wounds.  Deep tissue injury in right heel and some mild changes of a skin injury on his left heel.  Changes are unchanged since examination on 01/05/2019. Psychiatry: Judgement and insight appear normal. Mood & affect appropriate.   Discharge Instructions   Discharge Instructions    (HEART FAILURE PATIENTS) Call MD:  Anytime you have any of the following symptoms: 1) 3 pound weight gain in 24 hours or 5 pounds in 1 week 2) shortness of breath, with or without a dry hacking cough 3) swelling in the hands, feet or stomach 4) if you have to sleep on extra pillows at night in order to breathe.   Complete by: As directed    Diet - low sodium heart healthy   Complete by: As directed    Discharge instructions   Complete by: As directed    Heart healthy diet Follow daily weights Take medications as prescribed Outpatient follow-up with cardiology service Physical rehabilitation and conditioning as per skilled nursing facility protocol Outpatient follow-up with urology as instructed for voiding trials and ongoing urinary retention requiring the use of a Foley catheter.     Allergies as of 01/11/2019      Reactions   Codeine Itching   Trileptal [oxcarbazepine]    Other reaction(s): Other (See Comments) Abnormal sodium levels Hyponatremia      Medication List    TAKE these medications   albuterol 108 (90 Base) MCG/ACT inhaler Commonly known as: VENTOLIN HFA Inhale 2 puffs into the lungs every 6 (six) hours as  needed for wheezing or shortness of breath.   alprazolam 2 MG tablet Commonly known as: XANAX Take 1 tablet (2  mg total) by mouth 2 (two) times daily.   amitriptyline 100 MG tablet Commonly known as: ELAVIL Take 100 mg by mouth at bedtime.   amLODipine 2.5 MG tablet Commonly known as: NORVASC TAKE 1 TABLET BY MOUTH  DAILY   aspirin EC 81 MG tablet Take 1 tablet (81 mg total) by mouth daily. Or Enteric Coated (EC) 81 mg   atorvastatin 40 MG tablet Commonly known as: LIPITOR Take 1 tablet (40 mg total) by mouth daily at 6 PM.   bisacodyl 5 MG EC tablet Commonly known as: DULCOLAX Take 5 mg by mouth daily as needed for mild constipation or moderate constipation.   budesonide-formoterol 80-4.5 MCG/ACT inhaler Commonly known as: Symbicort Inhale 2 puffs into the lungs every morning.   docusate sodium 100 MG capsule Commonly known as: COLACE Take 100 mg by mouth daily as needed for mild constipation.   finasteride 5 MG tablet Commonly known as: PROSCAR Take 5 mg by mouth every morning.   furosemide 40 MG tablet Commonly known as: LASIX Take 1 tablet (40 mg total) by mouth daily.   gabapentin 800 MG tablet Commonly known as: NEURONTIN Take 1 tablet (800 mg total) by mouth 3 (three) times daily.   guaiFENesin 600 MG 12 hr tablet Commonly known as: MUCINEX Take 600 mg by mouth 2 (two) times daily.   latanoprost 0.005 % ophthalmic solution Commonly known as: XALATAN Place 1 drop into both eyes at bedtime.   metoprolol tartrate 25 MG tablet Commonly known as: LOPRESSOR Take 12.5 mg by mouth 2 (two) times daily.   nitroGLYCERIN 0.4 MG SL tablet Commonly known as: NITROSTAT Place 1 tablet (0.4 mg total) under the tongue every 5 (five) minutes x 3 doses as needed for chest pain.   potassium chloride 10 MEQ tablet Commonly known as: K-DUR Take 1 tablet (10 mEq total) by mouth daily.   predniSONE 20 MG tablet Commonly known as: DELTASONE Take 20 mg by mouth daily  x2-day; then 10 mg by mouth daily x3 days and stop prednisone.   tamsulosin 0.4 MG Caps capsule Commonly known as: FLOMAX Take 0.4 mg by mouth at bedtime.      Allergies  Allergen Reactions  . Codeine Itching  . Trileptal [Oxcarbazepine]     Other reaction(s): Other (See Comments) Abnormal sodium levels Hyponatremia    Contact information for follow-up providers    [facility] Follow up.   Specialty: Home Health Services Why: home health provider Contact information: [place] [address] [phone]        [name], MD Follow up.   Specialty: Family Medicine Why: appointment 01/07/19 at 2:20, arrive 10-15 minutes early to check in Contact information: [address] [place] [address] [phone]        [name], NP Follow up.   Specialties: Cardiology, Radiology Why: Cardiology Hospital Follow-up on 01/21/2019 at 2:00PM.  Contact information: [place] [address] [place] On 02/02/2019.   Why: Follow up foley catheter. 1:30pm  Contact information: [address] [place] [phone] [id number]           Contact information for after-discharge care    [facility] .   Service: Skilled Nursing Contact information: [address] [place] [phone]  The results of significant diagnostics from this hospitalization (including imaging, microbiology, ancillary and laboratory) are listed below for reference.    Significant Diagnostic Studies: US Venous Img Lower Bilateral  Result Date: 01/02/2019 CLINICAL DATA:  73 year old male with lower extremity edema EXAM: BILATERAL LOWER EXTREMITY VENOUS DOPPLER ULTRASOUND TECHNIQUE: Gray-scale sonography with graded compression, as well as color Doppler and duplex ultrasound were  performed to evaluate the lower extremity deep venous systems from the level of the common femoral vein and including the common femoral, femoral, profunda femoral, popliteal and calf veins including the posterior tibial, peroneal and gastrocnemius veins when visible. The superficial great saphenous vein was also interrogated. Spectral Doppler was utilized to evaluate flow at rest and with distal augmentation maneuvers in the common femoral, femoral and popliteal veins. COMPARISON:  None. FINDINGS: RIGHT LOWER EXTREMITY Common Femoral Vein: No evidence of thrombus. Normal compressibility, respiratory phasicity and response to augmentation. Increased pulsatility of the venous waveforms. Saphenofemoral Junction: No evidence of thrombus. Normal compressibility and flow on color Doppler imaging. Profunda Femoral Vein: No evidence of thrombus. Normal compressibility and flow on color Doppler imaging. Femoral Vein: No evidence of thrombus. Normal compressibility, respiratory phasicity and response to augmentation. Popliteal Vein: No evidence of thrombus. Normal compressibility, respiratory phasicity and response to augmentation. Calf Veins: No evidence of thrombus. Normal compressibility and flow on color Doppler imaging. Superficial Great Saphenous Vein: No evidence of thrombus. Normal compressibility. Venous Reflux:  None. Other Findings:  None. LEFT LOWER EXTREMITY Common Femoral Vein: No evidence of thrombus. Normal compressibility, respiratory phasicity and response to augmentation. Increased pulsatility of the venous waveforms. Saphenofemoral Junction: No evidence of thrombus. Normal compressibility and flow on color Doppler imaging. Profunda Femoral Vein: No evidence of thrombus. Normal compressibility and flow on color Doppler imaging. Femoral Vein: No evidence of thrombus. Normal compressibility, respiratory phasicity and response to augmentation. Popliteal Vein: No evidence of thrombus. Normal compressibility,  respiratory phasicity and response to augmentation. Calf Veins: No evidence of thrombus. Normal compressibility and flow on color Doppler imaging. Superficial Great Saphenous Vein: No evidence of thrombus. Normal compressibility. Venous Reflux:  None. Other Findings:  None. IMPRESSION: 1. No evidence of deep venous thrombosis in either lower extremity. 2. Increased pulsatility of the venous waveforms suggests elevated right heart pressures. Findings can be seen in tricuspid regurgitation, right heart failure, pulmonary hypertension and COPD. Electronically Signed   By: Jacqulynn Cadet M.D.   On: 01/02/2019 14:30   Portable Chest 1 View  Result Date: 01/03/2019 CLINICAL DATA:  Shortness of breath and altered mental status. Congestive heart failure. EXAM: PORTABLE CHEST 1 VIEW COMPARISON:  01/01/2019 and 08/20/2017 FINDINGS: Stable cardiomegaly and pulmonary vascular congestion. Aortic atherosclerosis. Improved aeration of both lungs is seen. Bilateral pleural-parenchymal scarring remains stable compared to 2019. No evidence of acute infiltrate or pleural effusion. IMPRESSION: Stable cardiomegaly, pulmonary vascular congestion, and bilateral pleural-parenchymal scarring. No acute findings. Electronically Signed   By: Marlaine Hind M.D.   On: 01/03/2019 05:19   Dg Chest Port 1 View  Result Date: 01/01/2019 CLINICAL DATA:  73 year old male with shortness of breath EXAM: PORTABLE CHEST 1 VIEW COMPARISON:  Chest radiograph dated 12/15/2018 FINDINGS: There is cardiomegaly with vascular congestion. There is shallow inspiration. Small bilateral pleural effusions and bibasilar densities similar to prior radiograph which may represent atelectasis or infiltrate. No pneumothorax. Stable cardiomegaly. No acute osseous pathology. IMPRESSION: 1. Cardiomegaly with vascular congestion. 2. Small bilateral pleural effusions and bibasilar densities similar to prior radiograph. Electronically Signed   By:  Anner Crete M.D.    On: 01/01/2019 23:20   Dg Chest Port 1 View  Result Date: 12/15/2018 CLINICAL DATA:  Fever, weakness EXAM: PORTABLE CHEST 1 VIEW COMPARISON:  08/20/2017 chest radiograph. FINDINGS: Low lung volumes. Stable cardiomediastinal silhouette with moderate cardiomegaly. No pneumothorax. Calcified bilateral pleural plaques and pleural thickening, unchanged. No convincing pleural effusion. Patchy consolidation at both lung bases superimposed on chronic reticular opacities. No overt pulmonary edema. IMPRESSION: 1. Patchy consolidation superimposed on chronic reticular opacities at the lung bases, cannot exclude aspiration or pneumonia. Chest radiograph follow-up advised. 2. 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 3. Moderate cardiomegaly without overt pulmonary edema. Electronically Signed   By: Ilona Sorrel M.D.   On: 12/15/2018 18:08   Dg Knee Complete 4 Views Right  Result Date: 12/15/2018 CLINICAL DATA:  Fall, right knee pain when moving EXAM: RIGHT KNEE - COMPLETE 4+ VIEW COMPARISON:  None. FINDINGS: No fracture or dislocation. Small suprapatellar right knee joint effusion. No suspicious focal osseous lesion. Small superior right patellar enthesophyte. No significant degenerative arthropathy. Posterior vascular calcifications. No radiopaque foreign body. IMPRESSION: Small suprapatellar right knee joint effusion, with no right knee fracture or dislocation. Electronically Signed   By: Ilona Sorrel M.D.   On: 12/15/2018 18:05    Microbiology: Recent Results (from the past 240 hour(s))  SARS Coronavirus 2 [facility] order, Performed in [facility] hospital lab) Nasopharyngeal Nasopharyngeal Swab     Status: None   Collection Time: 01/01/19 10:21 PM   Specimen: Nasopharyngeal Swab  Result Value Ref Range Status   SARS Coronavirus 2 NEGATIVE NEGATIVE Final    Comment: (NOTE) If result is NEGATIVE SARS-CoV-2 target nucleic acids are NOT DETECTED. The  SARS-CoV-2 RNA is generally detectable in upper and lower  respiratory specimens during the acute phase of infection. The lowest  concentration of SARS-CoV-2 viral copies this assay can detect is 250  copies / mL. A negative result does not preclude SARS-CoV-2 infection  and should not be used as the sole basis for treatment or other  patient management decisions.  A negative result may occur with  improper specimen collection / handling, submission of specimen other  than nasopharyngeal swab, presence of viral mutation(s) within the  areas targeted by this assay, and inadequate number of viral copies  (<250 copies / mL). A negative result must be combined with clinical  observations, patient history, and epidemiological information. If result is POSITIVE SARS-CoV-2 target nucleic acids are DETECTED. The SARS-CoV-2 RNA is generally detectable in upper and lower  respiratory specimens dur ing the acute phase of infection.  Positive  results are indicative of active infection with SARS-CoV-2.  Clinical  correlation with patient history and other diagnostic information is  necessary to determine patient infection status.  Positive results do  not rule out bacterial infection or co-infection with other viruses. If result is PRESUMPTIVE POSTIVE SARS-CoV-2 nucleic acids MAY BE PRESENT.   A presumptive positive result was obtained on the submitted specimen  and confirmed on repeat testing.  While 2019 novel coronavirus  (SARS-CoV-2) nucleic acids may be present in the submitted sample  additional confirmatory testing may be necessary for epidemiological  and / or clinical management purposes  to differentiate between  SARS-CoV-2 and other Sarbecovirus currently known to infect humans.  If clinically indicated additional testing with an alternate test  methodology [phone]) is advised. The SARS-CoV-2 RNA is generally  detectable in upper and lower respiratory sp ecimens during the acute  phase of infection. The expected result is Negative. Fact Sheet for Patients:  StrictlyIdeas.no Fact Sheet for Healthcare Providers: BankingDealers.co.za This test is not yet approved or cleared by the [place] FDA and has been authorized for detection and/or diagnosis of SARS-CoV-2 by FDA under an Emergency Use Authorization (EUA).  This EUA will remain in effect (meaning this test can be used) for the duration of the COVID-19 declaration under Section 564(b)(1) of the Act, 21 U.S.C. section 360bbb-3(b)(1), unless the authorization is terminated or revoked sooner. Performed at [facility], [address], [place], [place] [address]   Urine culture     Status: Abnormal   Collection Time: 01/02/19 12:46 AM   Specimen: Urine, Catheterized  Result Value Ref Range Status   Specimen Description   Final    URINE, CATHETERIZED Performed at [facility], [address], [place], [place] [address]    Special Requests   Final    Normal Performed at [facility], [address], [place], [place] [address]    Culture (A)  Final    >=100,000 COLONIES/mL STAPHYLOCOCCUS SPECIES (COAGULASE NEGATIVE)   Report Status 01/04/2019 FINAL  Final   Organism ID, Bacteria STAPHYLOCOCCUS SPECIES (COAGULASE NEGATIVE) (A)  Final      Susceptibility   Staphylococcus species (coagulase negative) - MIC*    CIPROFLOXACIN >=8 RESISTANT Resistant     GENTAMICIN <=0.5 SENSITIVE Sensitive     NITROFURANTOIN <=16 SENSITIVE Sensitive     OXACILLIN >=4 RESISTANT Resistant     TETRACYCLINE 2 SENSITIVE Sensitive     VANCOMYCIN 2 SENSITIVE Sensitive     TRIMETH/SULFA <=10 SENSITIVE Sensitive     CLINDAMYCIN <=0.25 SENSITIVE Sensitive     RIFAMPIN <=0.5 SENSITIVE Sensitive     Inducible Clindamycin NEGATIVE Sensitive     * >=100,000 COLONIES/mL STAPHYLOCOCCUS SPECIES (COAGULASE NEGATIVE)  MRSA PCR Screening     Status: None   Collection Time: 01/02/19  8:35 AM    Specimen: Nasopharyngeal  Result Value Ref Range Status   MRSA by PCR NEGATIVE NEGATIVE Final    Comment:        The GeneXpert MRSA Assay (FDA approved for NASAL specimens only), is one component of a comprehensive MRSA colonization surveillance program. It is not intended to diagnose MRSA infection nor to guide or monitor treatment for MRSA infections. Performed at [facility], [address], [place], [place] [address]   Culture, sputum-assessment     Status: None   Collection Time: 01/02/19  8:39 AM   Specimen: Sputum  Result Value Ref Range Status   Specimen Description SPUTUM  Final   Special Requests NONE  Final   Sputum evaluation   Final    Sputum specimen not acceptable for testing.  Please recollect.   NOTIFIED NURSE DAVIS AT 1300 ON 01/02/2019 BY EVA Performed at [facility], [address] [place], [place] [address]    Report Status 01/02/2019 FINAL  Final     Labs: Basic Metabolic Panel: Recent Labs  Lab 01/05/19 0649 01/06/19 0808 01/07/19 0832 01/10/19 0511  NA 134* 133* 133* 130*  K 4.1 3.9 3.6 3.5  CL 93* 88* 81* 84*  CO2 36* 33* 39* 37*  GLUCOSE 129* 138* 77 111*  BUN 37* 34* 35* 38*  CREATININE 0.98 0.86 0.99 0.85  CALCIUM 8.5* 8.8* 9.0 8.7*  MG 1.9  --   --   --     BNP (last 3 results) Recent Labs    01/01/19 2327  BNP 885.0*  Signed:  Barton Dubois MD.  [facility] 01/11/2019, 8:58 AM

## 2019-01-12 ENCOUNTER — Non-Acute Institutional Stay (SKILLED_NURSING_FACILITY): Payer: Medicare Other | Admitting: Adult Health

## 2019-01-12 ENCOUNTER — Encounter: Payer: Self-pay | Admitting: Adult Health

## 2019-01-12 DIAGNOSIS — I5031 Acute diastolic (congestive) heart failure: Secondary | ICD-10-CM

## 2019-01-12 DIAGNOSIS — J449 Chronic obstructive pulmonary disease, unspecified: Secondary | ICD-10-CM

## 2019-01-12 DIAGNOSIS — I471 Supraventricular tachycardia: Secondary | ICD-10-CM

## 2019-01-12 DIAGNOSIS — I2510 Atherosclerotic heart disease of native coronary artery without angina pectoris: Secondary | ICD-10-CM | POA: Diagnosis not present

## 2019-01-12 DIAGNOSIS — I4892 Unspecified atrial flutter: Secondary | ICD-10-CM

## 2019-01-12 DIAGNOSIS — E785 Hyperlipidemia, unspecified: Secondary | ICD-10-CM

## 2019-01-12 DIAGNOSIS — G609 Hereditary and idiopathic neuropathy, unspecified: Secondary | ICD-10-CM

## 2019-01-12 DIAGNOSIS — H401130 Primary open-angle glaucoma, bilateral, stage unspecified: Secondary | ICD-10-CM

## 2019-01-12 DIAGNOSIS — F411 Generalized anxiety disorder: Secondary | ICD-10-CM

## 2019-01-12 DIAGNOSIS — J61 Pneumoconiosis due to asbestos and other mineral fibers: Secondary | ICD-10-CM

## 2019-01-12 DIAGNOSIS — I739 Peripheral vascular disease, unspecified: Secondary | ICD-10-CM | POA: Diagnosis not present

## 2019-01-12 DIAGNOSIS — J9622 Acute and chronic respiratory failure with hypercapnia: Secondary | ICD-10-CM

## 2019-01-12 DIAGNOSIS — R339 Retention of urine, unspecified: Secondary | ICD-10-CM

## 2019-01-12 DIAGNOSIS — J9621 Acute and chronic respiratory failure with hypoxia: Secondary | ICD-10-CM

## 2019-01-12 DIAGNOSIS — E876 Hypokalemia: Secondary | ICD-10-CM

## 2019-01-12 NOTE — Progress Notes (Addendum)
Location:    [place] Room Number: 157/D Place of Service:  SNF (31)   CODE STATUS: Full Code  Allergies  Allergen Reactions  . Codeine Itching  . Trileptal [Oxcarbazepine]     Other reaction(s): Other (See Comments) Abnormal sodium levels Hyponatremia    Chief Complaint  Patient presents with  . Hospitalization Follow-up    HPI:  He is a 73 year old man who has been hospitalized from 12-05-18 through 01-11-19. He had been hospitalized from 12-15-18 through 12-19-18 for pneumonia. He presented to the ED with altered mental status. He was treated for acute on chronic resp failure required BIPAP. He was treated for copd exacerbation and is on a prednisone taper. He was treated for acute chf. He has a foley and has failed 2 voiding trials and will need a urology follow up. He is here for short term rehab with his goal to return home. He denies any cough or shortness of breath; denies any leg swelling. He will continue to be followed for his chronic illnesses including: chf; copd; pvd.   Past Medical History:  Diagnosis Date  . AAA (abdominal aortic aneurysm), stable 04/20/2014  . Arthritis   . 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 CAD (coronary artery disease)    a. s/p cath in 2015 showing occluded RCA and high-grade LCx stenosis treated with DESx2 to LCx  . Cancer ([place])   . Depression   . Diverticulitis    hospital 2011 [facility]  . Diverticulosis   . GERD (gastroesophageal reflux disease)   . 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 Hernia of unspecified site of abdominal cavity without mention of obstruction or gangrene    hiatal  . Hyperlipidemia LDL goal <70 04/20/2014  . Hypertension   . IBS (irritable bowel syndrome)   . Myocardial infarction ([place]) 01/2014   NSTEMI  . Peripheral vascular disease ([facility])    ankle brachial index of 0.79 on the right and 0.61 on the left.   . 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 Reflex sympathetic dystrophy   . 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 SOB (shortness of breath)     Past Surgical History:  Procedure Laterality Date  . CARDIAC CATHETERIZATION    . CATARACT EXTRACTION    . CORONARY ANGIOPLASTY    . HERNIA REPAIR    . LEFT HEART CATHETERIZATION WITH CORONARY ANGIOGRAM N/A 01/17/2014   Procedure: LEFT HEART CATHETERIZATION WITH CORONARY ANGIOGRAM;  Surgeon: Leonie Man, MD;  Location[facility] CATH LAB;  Service: Cardiovascular;  Laterality: N/A;  . left shoulder      x 3  . NISSEN FUNDOPLICATION    . right elbow surgery     x 2  . right knee arthroscopy    . squamous cell skin cancer     Left Hand  . SUPRAVENTRICULAR TACHYCARDIA ABLATION N/A 01/16/2014   Procedure: SUPRAVENTRICULAR TACHYCARDIA ABLATION;  Surgeon: Evans Lance, MD;  Location: [facility] CATH LAB;  Service: Cardiovascular;  Laterality: N/A;    Social History   Socioeconomic History  . Marital status: Widowed    Spouse name: Not on file  . Number of children: 2  . Years of education: 39  . Highest education level: Not on file  Occupational History  . Occupation: [employer]  . Financial resource strain: Not on file  . Food insecurity    Worry: Not on file    Inability: Not on file  . Transportation  needs    Medical: Not on file    Non-medical: Not on file  Tobacco Use  . Smoking status: Former Smoker    Packs/day: 2.50    Years: 50.00    Pack years: 125.00    Types: Cigarettes    Quit date: 05/05/2005    Years since quitting: 13.7  . Smokeless tobacco: Former Systems developer    Quit date: 05/03/2012  Substance and Sexual Activity  . Alcohol use: No    Alcohol/week: 0.0 standard drinks    Comment: last use 2013  . Drug use: No  . Sexual activity: Not on file    Comment: widowed, Veteran  Lifestyle  . Physical activity    Days per week: Not on file    Minutes per session: Not on file  . Stress: Not on file  Relationships  . Social Herbalist on phone: Not on file    Gets together: Not on file    Attends  religious service: Not on file    Active member of club or organization: Not on file    Attends meetings of clubs or organizations: Not on file    Relationship status: Not on file  . Intimate partner violence    Fear of current or ex partner: Not on file    Emotionally abused: Not on file    Physically abused: Not on file    Forced sexual activity: Not on file  Other Topics Concern  . Not on file  Social History Narrative   Patient drinks 3 cups of caffeine daily.   Patient is right handed.   Family History  Problem Relation Age of Onset  . Colon cancer Mother   . Colon cancer Maternal Aunt   . Colon cancer Maternal Uncle   . Heart attack Neg Hx   . Stroke Neg Hx       VITAL SIGNS BP 122/64   Pulse 62   Temp 97.8 F (36.6 C) (Oral)   Resp 20   Ht 6' (1.829 m)   Wt 187 lb 3.2 oz (84.9 kg)   SpO2 94%   BMI 25.39 kg/m   Outpatient Encounter Medications as of 01/12/2019  Medication Sig  . albuterol (VENTOLIN HFA) 108 (90 Base) MCG/ACT inhaler Inhale 2 puffs into the lungs every 6 (six) hours as needed for wheezing or shortness of breath.  . alprazolam (XANAX) 2 MG tablet Take 1 tablet (2 mg total) by mouth 2 (two) times daily.  [name] amitriptyline (ELAVIL) 100 MG tablet Take 100 mg by mouth at bedtime.  [name] aspirin EC 81 MG tablet Take 1 tablet (81 mg total) by mouth daily. Or Enteric Coated (EC) 81 mg  . atorvastatin (LIPITOR) 40 MG tablet Take 1 tablet (40 mg total) by mouth daily at 6 PM.  . bisacodyl (DULCOLAX) 5 MG EC tablet Take 5 mg by mouth daily as needed for mild constipation or moderate constipation.  . budesonide-formoterol (SYMBICORT) 80-4.5 MCG/ACT inhaler Inhale 2 puffs into the lungs every morning.  . docusate sodium (COLACE) 100 MG capsule Take 100 mg by mouth daily as needed for mild constipation.  . finasteride (PROSCAR) 5 MG tablet Take 5 mg by mouth every morning.   . furosemide (LASIX) 40 MG tablet Take 1 tablet (40 mg total) by mouth daily.  [name] gabapentin  (NEURONTIN) 800 MG tablet Take 1 tablet (800 mg total) by mouth 3 (three) times daily.  [name] guaiFENesin (MUCINEX) 600 MG 12 hr tablet Take 600  mg by mouth 2 (two) times daily.   [name] latanoprost (XALATAN) 0.005 % ophthalmic solution Place 1 drop into both eyes at bedtime.  . metoprolol tartrate (LOPRESSOR) 25 MG tablet Take 12.5 mg by mouth 2 (two) times daily.  . nitroGLYCERIN (NITROSTAT) 0.4 MG SL tablet Place 1 tablet (0.4 mg total) under the tongue every 5 (five) minutes x 3 doses as needed for chest pain.  . NON FORMULARY Diet: Regular, NAS, Consistent Carbohydrate  . OXYGEN Oxygen @@ 3L/mn via [place] Special Instructions: Document O2 sat qshift. Every Shift Day, Evening, Night  . potassium chloride (K-DUR) 10 MEQ tablet Take 1 tablet (10 mEq total) by mouth daily.  . predniSONE (DELTASONE) 20 MG tablet Take 20 mg on 01/12/2019-01/13/2019 by mouth daily x2-day; then 10 mg on 01/14/2019-01/16/2019 by mouth daily x3 days and stop prednisone.  . tamsulosin (FLOMAX) 0.4 MG CAPS capsule Take 0.4 mg by mouth at bedtime.   No facility-administered encounter medications on file as of 01/12/2019.      SIGNIFICANT DIAGNOSTIC EXAMS  TODAY;   01-01-19: chest x-ray:  1. Cardiomegaly with vascular congestion. 2. Small bilateral pleural effusions and bibasilar densities similar to prior radiograph  01-02-19: bilateral lower extremity venous doppler:  1. No evidence of deep venous thrombosis in either lower extremity. 2. Increased pulsatility of the venous waveforms suggests elevated right heart pressures. Findings can be seen in tricuspid regurgitation, right heart failure, pulmonary hypertension and COPD.  01-02-19: 2-d echo:   1. The left ventricle has normal systolic function, with an ejection fraction of 55-60%. The cavity size was normal. There is mildly increased left ventricular wall thickness. Left ventricular diastolic Doppler parameters are indeterminate.  2. The right ventricle has normal systolic  function. The cavity was normal. There is no increase in right ventricular wall thickness. Right ventricular systolic pressure could not be assessed.  3. No evidence of mitral valve stenosis.  4. The aortic valve is tricuspid. Mild thickening of the aortic valve. Mild calcification of the aortic valve. No stenosis of the aortic valve.  5. The aorta is normal unless otherwise noted.  6. The ascending aorta is normal in size and structure.  01-03-19: chest x-ray: Stable cardiomegaly, pulmonary vascular congestion, and bilateral pleural-parenchymal scarring. No acute findings.  LABS REVIEWED TODAY;   01-01-19: wbc 8.2; hgb 14.2; hct 51.1; mcv 103.4; plt 216; glucose 100; bun 46; creat 1.88; k+ 5.8; na++ 142; ca 8.7; liver normal albumin 3.1 01-02-19: wbc 5.3; hgb 145; hct 51.8; mcv 101.2; plt 219; glucose 132; bun 45; creat 1.52; k+ 4.7; na++ 143; ca 9.0 ;liver normal albumin 3.3; tsh 0.435 vit B 12; 296; urine culture: staphylococcus species 01-05-19: glucose 129; bun 37; creat 0.98; k+ 4.1; na++ 134; ca 8.5; mag 1.9 01-10-19: glucose 111; bun 38; creat 0.85; k+ 3.5; na++ 130; ca 8.7    Review of Systems  Constitutional: Negative for malaise/fatigue.  Respiratory: Negative for cough and shortness of breath.   Cardiovascular: Negative for chest pain, palpitations and leg swelling.  Gastrointestinal: Negative for abdominal pain, constipation and heartburn.  Musculoskeletal: Negative for back pain, joint pain and myalgias.  Skin: Negative.   Neurological: Negative for dizziness.  Psychiatric/Behavioral: The patient is not nervous/anxious.       Physical Exam Constitutional:      General: He is not in acute distress.    Appearance: He is well-developed. He is not diaphoretic.  Eyes:     Comments: History of cataract extraction  Neck:  Musculoskeletal: Neck supple.     Thyroid: No thyromegaly.  Cardiovascular:     Rate and Rhythm: Normal rate and regular rhythm.     Heart sounds: Normal  heart sounds.     Comments: History of SVT ablation; ptca  Pulmonary:     Effort: Pulmonary effort is normal. No respiratory distress.     Breath sounds: Normal breath sounds.  Abdominal:     General: Bowel sounds are normal. There is no distension.     Palpations: Abdomen is soft.     Tenderness: There is no abdominal tenderness.  Musculoskeletal: Normal range of motion.     Comments: History of :  Left shoulder surgery X3; right elbow surgery X 3  Lymphadenopathy:     Cervical: No cervical adenopathy.  Skin:    General: Skin is warm and dry.  Neurological:     Mental Status: He is alert and oriented to person, place, and time.      ASSESSMENT/ PLAN:  TODAY;   1. Acute diastolic CHF (Congestive heart failure) EF 55-60% (01-02-19) is stable will continue lasix 40 mg daily; lopressor 12.5 mg twice daily   2. Atrial flutter/ SVT (supraventricular tachycardia) s/p ablation: heart rate is stable: will continue asa 81 mg daily lopressor 12.5 mg twice daily for rate control.  3. Peripheral vascular disease/heredity and idiopathic peripheral neuropathy: is stable will continue asa 81 mg daily elavil 100 mg nightly and neurontin 800 mg three times daily   4.  Acute on chronic respiratory failure with hypoxia and hypercapnia / COPD with chronic bronchitis/ asbestosis: is stable is 02 dependent will continue albuterol 2 puffs every 6 hours as needed; symbicort 80/4.5 mcg 2 puffs twice daily; mucinex 600 mg twice daily  will complete prednisone taper.   5. Urinary retention: is without change has foley has failed voiding trial twice daily is awaiting urology consult: will continue proscar 5 mg daily and flomax 0.4 mg daily   6. Hyperlipidemia LDL goal <70: is stable will continue lipitor 40 mg daily   7. Hypokalemia: is stable k+ 3.5 will continue k+ 10 meq daily   8. Primary open angle glaucoma of both eyes unspecified stage: is stable will continue xalatan to both eyes  9.   Generalized anxiety disorder: is stable will continue xanax 2 mg twice daily   10. Physical deconditioning multiple falls: is without change: will continue therapy as directed for gait balance strength and adl training.    On 01-17-19: will check BMP       MD is aware of resident's narcotic use and is in agreement with current plan of care. We will attempt to wean resident as appropriate.  Ok Edwards NP [facility]  Contact [phone] Monday through Friday 8am- 5pm  After hours call [phone]

## 2019-01-15 DIAGNOSIS — E876 Hypokalemia: Secondary | ICD-10-CM | POA: Insufficient documentation

## 2019-01-15 DIAGNOSIS — I4892 Unspecified atrial flutter: Secondary | ICD-10-CM | POA: Insufficient documentation

## 2019-01-15 DIAGNOSIS — F419 Anxiety disorder, unspecified: Secondary | ICD-10-CM | POA: Insufficient documentation

## 2019-01-15 DIAGNOSIS — H4010X0 Unspecified open-angle glaucoma, stage unspecified: Secondary | ICD-10-CM | POA: Insufficient documentation

## 2019-01-17 ENCOUNTER — Other Ambulatory Visit: Payer: Self-pay | Admitting: Adult Health

## 2019-01-17 MED ORDER — ALPRAZOLAM 2 MG PO TABS: 2.0000 mg | ORAL_TABLET | Freq: Two times a day (BID) | ORAL | 0 refills | Status: DC

## 2019-01-19 ENCOUNTER — Non-Acute Institutional Stay (SKILLED_NURSING_FACILITY): Payer: Medicare Other | Admitting: Adult Health

## 2019-01-19 ENCOUNTER — Encounter: Payer: Self-pay | Admitting: Adult Health

## 2019-01-19 DIAGNOSIS — I471 Supraventricular tachycardia, unspecified: Secondary | ICD-10-CM

## 2019-01-19 DIAGNOSIS — J61 Pneumoconiosis due to asbestos and other mineral fibers: Secondary | ICD-10-CM | POA: Diagnosis not present

## 2019-01-19 DIAGNOSIS — J449 Chronic obstructive pulmonary disease, unspecified: Secondary | ICD-10-CM | POA: Diagnosis not present

## 2019-01-19 DIAGNOSIS — J4489 Other specified chronic obstructive pulmonary disease: Secondary | ICD-10-CM

## 2019-01-19 DIAGNOSIS — I4892 Unspecified atrial flutter: Secondary | ICD-10-CM

## 2019-01-19 DIAGNOSIS — I5031 Acute diastolic (congestive) heart failure: Secondary | ICD-10-CM

## 2019-01-19 DIAGNOSIS — J9611 Chronic respiratory failure with hypoxia: Secondary | ICD-10-CM

## 2019-01-19 NOTE — Progress Notes (Signed)
Location:  [facility] Room Number: 157-D Place of Service:  SNF (31)   CODE STATUS: Full Code  Allergies  Allergen Reactions  . Codeine Itching  . Trileptal [Oxcarbazepine]     Other reaction(s): Other (See Comments) Abnormal sodium levels Hyponatremia    Chief Complaint  Patient presents with  . Medical Management of Chronic Issues          Acute on chronic respiratory failure with hypoxia and hyyercapnia/ COPD with chronic bronchitis asbestosis:   Acute diastolic CHF (congestive heart failure) Atrial flutter/ SVT (suprventricular tachycardia)    Weekly follow up for the first 30 days post hospitalization.      HPI:  He is a 72 year old short term rehab patient being seen for the management of his chronic illnesses; respiratory failure; chf; atrial flutter. He continues to participate in short term rehab. He denies any cough shortness of breath or palpitations.    Past Medical History:  Diagnosis Date  . AAA (abdominal aortic aneurysm), stable 04/20/2014  . Arthritis   . 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 CAD (coronary artery disease)    a. s/p cath in 2015 showing occluded RCA and high-grade LCx stenosis treated with DESx2 to LCx  . Cancer ([place])   . Depression   . Diverticulitis    hospital 2011 [facility]  . Diverticulosis   . GERD (gastroesophageal reflux disease)   . 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 Hernia of unspecified site of abdominal cavity without mention of obstruction or gangrene    hiatal  . Hyperlipidemia LDL goal <70 04/20/2014  . Hypertension   . IBS (irritable bowel syndrome)   . Myocardial infarction ([place]) 01/2014   NSTEMI  . Peripheral vascular disease (HCC)    ankle brachial index of 0.79 on the right and 0.61 on the left.   . 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 Reflex sympathetic dystrophy   . 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 SOB (shortness of breath)     Past Surgical History:  Procedure Laterality Date  . CARDIAC  CATHETERIZATION    . CATARACT EXTRACTION    . CORONARY ANGIOPLASTY    . HERNIA REPAIR    . LEFT HEART CATHETERIZATION WITH CORONARY ANGIOGRAM N/A 01/17/2014   Procedure: LEFT HEART CATHETERIZATION WITH CORONARY ANGIOGRAM;  Surgeon: Leonie Man, MD;  Location: [facility] CATH LAB;  Service: Cardiovascular;  Laterality: N/A;  . left shoulder      x 3  . NISSEN FUNDOPLICATION    . right elbow surgery     x 2  . right knee arthroscopy    . squamous cell skin cancer     Left Hand  . SUPRAVENTRICULAR TACHYCARDIA ABLATION N/A 01/16/2014   Procedure: SUPRAVENTRICULAR TACHYCARDIA ABLATION;  Surgeon: Evans Lance, MD;  Location: [facility] CATH LAB;  Service: Cardiovascular;  Laterality: N/A;    Social History   Socioeconomic History  . Marital status: Widowed    Spouse name: Not on file  . Number of children: 2  . Years of education: 57  . Highest education level: Not on file  Occupational History  . Occupation: [employer]  . Financial resource strain: Not on file  . Food insecurity    Worry: Not on file    Inability: Not on file  . Transportation needs    Medical: Not on file    Non-medical: Not on file  Tobacco Use  . Smoking status: Former  Smoker    Packs/day: 2.50    Years: 50.00    Pack years: 125.00    Types: Cigarettes    Quit date: 05/05/2005    Years since quitting: 13.7  . Smokeless tobacco: Former Systems developer    Quit date: 05/03/2012  Substance and Sexual Activity  . Alcohol use: No    Alcohol/week: 0.0 standard drinks    Comment: last use 2013  . Drug use: No  . Sexual activity: Not on file    Comment: widowed, Veteran  Lifestyle  . Physical activity    Days per week: Not on file    Minutes per session: Not on file  . Stress: Not on file  Relationships  . Social Herbalist on phone: Not on file    Gets together: Not on file    Attends religious service: Not on file    Active member of club or organization: Not on file    Attends meetings of clubs or  organizations: Not on file    Relationship status: Not on file  . Intimate partner violence    Fear of current or ex partner: Not on file    Emotionally abused: Not on file    Physically abused: Not on file    Forced sexual activity: Not on file  Other Topics Concern  . Not on file  Social History Narrative   Patient drinks 3 cups of caffeine daily.   Patient is right handed.   Family History  Problem Relation Age of Onset  . Colon cancer Mother   . Colon cancer Maternal Aunt   . Colon cancer Maternal Uncle   . Heart attack Neg Hx   . Stroke Neg Hx       VITAL SIGNS BP 120/78   Pulse 80   Temp 97.6 F (36.4 C) (Oral)   Resp 20   Ht 6' (1.829 m)   Wt 192 lb 12.8 oz (87.5 kg)   SpO2 94%   BMI 26.15 kg/m   Outpatient Encounter Medications as of 01/19/2019  Medication Sig  . albuterol (VENTOLIN HFA) 108 (90 Base) MCG/ACT inhaler Inhale 2 puffs into the lungs every 6 (six) hours as needed for wheezing or shortness of breath.  . alprazolam (XANAX) 2 MG tablet Take 1 tablet (2 mg total) by mouth 2 (two) times daily.  [name] amitriptyline (ELAVIL) 100 MG tablet Take 100 mg by mouth at bedtime.  [name] apixaban (ELIQUIS) 2.5 MG TABS tablet Take 2.5 mg by mouth 2 (two) times daily.  [name] atorvastatin (LIPITOR) 40 MG tablet Take 40 mg by mouth daily.  Roseanne Kaufman Peru-Castor Oil (VENELEX) OINT Apply 1 application topically See admin instructions. Apply to coccyx, sacrum, bilateral buttocks each shift and prn  . bisacodyl (DULCOLAX) 5 MG EC tablet Take 5 mg by mouth daily as needed for mild constipation or moderate constipation.  . budesonide-formoterol (SYMBICORT) 80-4.5 MCG/ACT inhaler Inhale 2 puffs into the lungs every morning.  . docusate sodium (COLACE) 100 MG capsule Take 100 mg by mouth daily as needed for mild constipation.  . finasteride (PROSCAR) 5 MG tablet Take 5 mg by mouth every morning.   . furosemide (LASIX) 40 MG tablet Take 1 tablet (40 mg total) by mouth daily.  [name] gabapentin  (NEURONTIN) 800 MG tablet Take 1 tablet (800 mg total) by mouth 3 (three) times daily.  [name] guaiFENesin (MUCINEX) 600 MG 12 hr tablet Take 600 mg by mouth 2 (two) times daily.   [name]  latanoprost (XALATAN) 0.005 % ophthalmic solution Place 1 drop into both eyes at bedtime.  . metoprolol tartrate (LOPRESSOR) 25 MG tablet Take 12.5 mg by mouth 2 (two) times daily.  . nitroGLYCERIN (NITROSTAT) 0.4 MG SL tablet Place 1 tablet (0.4 mg total) under the tongue every 5 (five) minutes x 3 doses as needed for chest pain.  . NON FORMULARY Diet: Regular, NAS, Consistent Carbohydrate  . OXYGEN Oxygen @@ 3L/mn via [place] Special Instructions: Document O2 sat qshift. Every Shift Day, Evening, Night  . potassium chloride (K-DUR) 10 MEQ tablet Take 1 tablet (10 mEq total) by mouth daily.  . tamsulosin (FLOMAX) 0.4 MG CAPS capsule Take 0.4 mg by mouth at bedtime.  . [DISCONTINUED] aspirin EC 81 MG tablet Take 1 tablet (81 mg total) by mouth daily. Or Enteric Coated (EC) 81 mg  . [DISCONTINUED] atorvastatin (LIPITOR) 40 MG tablet Take 1 tablet (40 mg total) by mouth daily at 6 PM.  . [DISCONTINUED] lamoTRIgine (LAMICTAL) 100 MG tablet Take 1 tablet (100 mg total) by mouth 2 (two) times daily.  . [DISCONTINUED] predniSONE (DELTASONE) 20 MG tablet Take 20 mg on 01/12/2019-01/13/2019 by mouth daily x2-day; then 10 mg on 01/14/2019-01/16/2019 by mouth daily x3 days and stop prednisone.   No facility-administered encounter medications on file as of 01/19/2019.      SIGNIFICANT DIAGNOSTIC EXAMS  PREVIOUS;   01-01-19: chest x-ray:  1. Cardiomegaly with vascular congestion. 2. Small bilateral pleural effusions and bibasilar densities similar to prior radiograph  01-02-19: bilateral lower extremity venous doppler:  1. No evidence of deep venous thrombosis in either lower extremity. 2. Increased pulsatility of the venous waveforms suggests elevated right heart pressures. Findings can be seen in tricuspid regurgitation, right heart  failure, pulmonary hypertension and COPD.  01-02-19: 2-d echo:   1. The left ventricle has normal systolic function, with an ejection fraction of 55-60%. The cavity size was normal. There is mildly increased left ventricular wall thickness. Left ventricular diastolic Doppler parameters are indeterminate.  2. The right ventricle has normal systolic function. The cavity was normal. There is no increase in right ventricular wall thickness. Right ventricular systolic pressure could not be assessed.  3. No evidence of mitral valve stenosis.  4. The aortic valve is tricuspid. Mild thickening of the aortic valve. Mild calcification of the aortic valve. No stenosis of the aortic valve.  5. The aorta is normal unless otherwise noted.  6. The ascending aorta is normal in size and structure.  01-03-19: chest x-ray: Stable cardiomegaly, pulmonary vascular congestion, and bilateral pleural-parenchymal scarring. No acute findings.  NO NEW EXAMS.   LABS REVIEWED PREVIOUS;   01-01-19: wbc 8.2; hgb 14.2; hct 51.1; mcv 103.4; plt 216; glucose 100; bun 46; creat 1.88; k+ 5.8; na++ 142; ca 8.7; liver normal albumin 3.1 01-02-19: wbc 5.3; hgb 145; hct 51.8; mcv 101.2; plt 219; glucose 132; bun 45; creat 1.52; k+ 4.7; na++ 143; ca 9.0 ;liver normal albumin 3.3; tsh 0.435 vit B 12; 296; urine culture: staphylococcus species 01-05-19: glucose 129; bun 37; creat 0.98; k+ 4.1; na++ 134; ca 8.5; mag 1.9 01-10-19: glucose 111; bun 38; creat 0.85; k+ 3.5; na++ 130; ca 8.7   NO NEW LABS.    Review of Systems  Constitutional: Negative for malaise/fatigue.  Respiratory: Negative for cough and shortness of breath.   Cardiovascular: Negative for chest pain, palpitations and leg swelling.  Gastrointestinal: Negative for abdominal pain, constipation and heartburn.  Musculoskeletal: Negative for back pain, joint pain and myalgias.  Skin: Positive for rash.  Neurological: Negative for dizziness.  Psychiatric/Behavioral: The  patient is not nervous/anxious.     Physical Exam Constitutional:      General: He is not in acute distress.    Appearance: He is well-developed. He is not diaphoretic.  Eyes:     Comments: History of cataract extraction   Neck:     Musculoskeletal: Neck supple.     Thyroid: No thyromegaly.  Cardiovascular:     Rate and Rhythm: Normal rate and regular rhythm.     Pulses: Normal pulses.     Heart sounds: Normal heart sounds.     Comments: History of SVT ablation; ptca  Pulmonary:     Effort: Pulmonary effort is normal. No respiratory distress.     Breath sounds: Normal breath sounds.  Abdominal:     General: Bowel sounds are normal. There is no distension.     Palpations: Abdomen is soft.     Tenderness: There is no abdominal tenderness.  Genitourinary:    Comments: Has foley  Musculoskeletal: Normal range of motion.     Right lower leg: No edema.     Left lower leg: No edema.     Comments:  History of :  Left shoulder surgery X3; right elbow surgery X 3   Lymphadenopathy:     Cervical: No cervical adenopathy.  Skin:    General: Skin is warm and dry.  Neurological:     Mental Status: He is alert and oriented to person, place, and time.  Psychiatric:        Mood and Affect: Mood normal.         ASSESSMENT/ PLAN:  TODAY;   1. Acute on chronic respiratory failure with hypoxia and hyyercapnia/ COPD with chronic bronchitis asbestosis: is stable is 02 dependent will continue albuterol 2 puffs every 6 hours as needed; symbicort 80/4.5 mcg 2 puffs twice daily mucinex 600 mg  twice daily   2. Acute diastolic CHF (congestive heart failure) EF 55-60% (12-06-18) is stable will continue lasix 40 mg daily lopressor 12.5 mg twice daily   3. Atrial flutter/ SVT (suprventricular tachycardia) s/p ablation: heart rate is stable will continue asa 81 mg daily lopressor 12.5 mg twice daily for rate control.   PREVIOUS   4. Peripheral vascular disease/heredity and idiopathic peripheral  neuropathy: is stable will continue asa 81 mg daily elavil 100 mg nightly and neurontin 800 mg three times daily   5. Urinary retention: is without change has foley has failed voiding trial twice daily is awaiting urology consult: will continue proscar 5 mg daily and flomax 0.4 mg daily   6. Hyperlipidemia LDL goal <70: is stable will continue lipitor 40 mg daily   7. Hypokalemia: is stable k+ 3.5 will continue k+ 10 meq daily   8. Primary open angle glaucoma of both eyes unspecified stage: is stable will continue xalatan to both eyes  9.  Generalized anxiety disorder: is stable will continue xanax 2 mg twice daily   10. Physical deconditioning multiple falls: is without change: will continue therapy as directed for gait balance strength and adl training.           MD is aware of resident's narcotic use and is in agreement with current plan of care. We will attempt to wean resident as appropriate.  Ok Edwards NP [facility]  Contact [phone] Monday through Friday 8am- 5pm  After hours call [phone]

## 2019-01-20 NOTE — Progress Notes (Deleted)
Cardiology Office Note   Date:  01/20/2019   ID:  Curtis [name], DOB Jul 08, 1945, MRN [id number]  PCP:  [name], MD  Cardiologist:  Dr. Jacinta Shoe    No chief complaint on file.     History of Present Illness: Curtis [name] is a 73 y.o. 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 Carles Collet.   At discharge at a flutter with rate control. Plan for DCCV in 3 weeks Weight down to 185.6 lbs (patient reported previous baseline of 215 lbs so will need to establish new dry weight).  Neg troponin.  ASA stopped since anticoagulation started.    /p RFCA by Dr. Lovena Le in 2015.No recurrent SVT by telemetry review. Remains on Lopressor 12.5 mg twice daily on atorvastatin for HLD.   Has foley due to urinary retention.  Past Medical History:  Diagnosis Date  . AAA (abdominal aortic aneurysm), stable 04/20/2014  . Arthritis   . 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 CAD (coronary artery disease)    a. s/p cath in 2015 showing occluded RCA and high-grade LCx stenosis treated with DESx2 to LCx  . Cancer ([place])   . Depression   . Diverticulitis    hospital 2011 [facility]  . Diverticulosis   . GERD (gastroesophageal reflux disease)   . 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 Hernia of unspecified site of abdominal cavity without mention of obstruction or gangrene    hiatal  . Hyperlipidemia LDL goal <70 04/20/2014  . Hypertension   . IBS (irritable bowel syndrome)   . Myocardial infarction ([place]) 01/2014   NSTEMI  . Peripheral vascular disease (HCC)    ankle brachial index of 0.79 on the right and 0.61 on the left.   . 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[place])   . Reflex sympathetic dystrophy   . Reflex sympathetic dystrophy of left  lower extremity   . SOB (shortness of breath)     Past Surgical History:  Procedure Laterality Date  . CARDIAC CATHETERIZATION    . CATARACT EXTRACTION    . CORONARY ANGIOPLASTY    . HERNIA REPAIR    . LEFT HEART CATHETERIZATION WITH CORONARY ANGIOGRAM N/A 01/17/2014   Procedure: LEFT HEART CATHETERIZATION WITH CORONARY ANGIOGRAM;  Surgeon: Leonie Man, MD;  Location: [facility] CATH LAB;  Service: Cardiovascular;  Laterality: N/A;  . left shoulder      x 3  . NISSEN FUNDOPLICATION    . right elbow surgery     x 2  . right knee arthroscopy    . squamous cell skin cancer     Left Hand  . SUPRAVENTRICULAR TACHYCARDIA ABLATION N/A 01/16/2014   Procedure: SUPRAVENTRICULAR TACHYCARDIA ABLATION;  Surgeon: Evans Lance, MD;  Location: [facility] CATH LAB;  Service: Cardiovascular;  Laterality: N/A;     Current Outpatient Medications  Medication Sig Dispense Refill  . alprazolam (XANAX) 2 MG tablet Take 1 tablet (2 mg total) by mouth 2 (two) times daily. 30 tablet 0   No current facility-administered medications for this visit.     Allergies:   Codeine and Trileptal [oxcarbazepine]    Social History:  The patient  reports that he quit smoking about 13 years ago.  His smoking use included cigarettes. He has a 125.00 pack-year smoking history. He quit smokeless tobacco use about 6 years ago. He reports that he does not drink alcohol or use drugs.   Family History:  The patient's ***family history includes Colon cancer in his maternal aunt, maternal uncle, and mother.    ROS:  General:no colds or fevers, no weight changes Skin:no rashes or ulcers HEENT:no blurred vision, no congestion CV:see HPI PUL:see HPI GI:no diarrhea constipation or melena, no indigestion GU:no hematuria, no dysuria MS:no joint pain, no claudication Neuro:no syncope, no lightheadedness Endo:no diabetes, no thyroid disease Wt Readings from Last 3 Encounters:  01/19/19 192 lb 12.8 oz (87.5 kg)  01/12/19 187 lb 3.2 oz  (84.9 kg)  01/11/19 186 lb 1.1 oz (84.4 kg)     PHYSICAL EXAM: VS:  There were no vitals taken for this visit. , BMI There is no height or weight on file to calculate BMI. General:Pleasant affect, NAD Skin:Warm and dry, brisk capillary refill HEENT:normocephalic, sclera clear, mucus membranes moist Neck:supple, no JVD, no bruits  Heart:S1S2 RRR without murmur, gallup, rub or click Lungs:clear without rales, rhonchi, or wheezes [id number], non tender, + BS, do not palpate liver spleen or masses Ext:no lower ext edema, 2+ pedal pulses, 2+ radial pulses Neuro:alert and oriented, MAE, follows commands, + facial symmetry    EKG:  EKG is ordered today. The ekg ordered today demonstrates ***   Recent Labs: 01/01/2019: B Natriuretic Peptide 885.0 01/02/2019: ALT 36; Hemoglobin 14.5; Platelets 219; TSH 0.435 01/05/2019: Magnesium 1.9 01/10/2019: BUN 38; Creatinine, Ser 0.85; Potassium 3.5; Sodium 130    Lipid Panel    Component Value Date/Time   CHOL 102 04/18/2014 0306   TRIG 222 (H) 04/18/2014 0306   HDL 24 (L) 04/18/2014 0306   CHOLHDL 4.3 04/18/2014 0306   VLDL 44 (H) 04/18/2014 0306   LDLCALC 34 04/18/2014 0306   LDLDIRECT 43.7 04/03/2014 1020       Other studies Reviewed: Additional studies/ records that were reviewed today include: ***.   ASSESSMENT AND PLAN:  1.  ***   Current medicines are reviewed with the patient today.  The patient Has no concerns regarding medicines.  The following changes have been made:  See above Labs/ tests ordered today include:see above  Disposition:   FU:  see above  Signed, Cecilie Kicks, NP  01/20/2019 10:41 PM    [facility] [address], [place], [place], [place] Phone: [phone]; Fax: [phone]

## 2019-01-21 ENCOUNTER — Ambulatory Visit: Payer: Medicare Other | Admitting: Cardiology

## 2019-01-24 ENCOUNTER — Non-Acute Institutional Stay (SKILLED_NURSING_FACILITY): Payer: Medicare Other | Admitting: Adult Health

## 2019-01-24 ENCOUNTER — Encounter: Payer: Self-pay | Admitting: Adult Health

## 2019-01-24 DIAGNOSIS — L89626 Pressure-induced deep tissue damage of left heel: Secondary | ICD-10-CM | POA: Diagnosis not present

## 2019-01-24 DIAGNOSIS — L89612 Pressure ulcer of right heel, stage 2: Secondary | ICD-10-CM | POA: Diagnosis not present

## 2019-01-24 NOTE — Progress Notes (Signed)
Location:    [place] Room Number: 157/D Place of Service:  SNF (31)   CODE STATUS: Full Code  Allergies  Allergen Reactions  . Codeine Itching  . Trileptal [Oxcarbazepine]     Other reaction(s): Other (See Comments) Abnormal sodium levels Hyponatremia    Chief Complaint  Patient presents with  . Acute Visit    Heel Ulcerations     HPI:  Staff report that he has bilateral heel ulcerations. He has a DTI on the left heel and stage 2 on the right. He denies any pain in the heels. No reports of fevers. No signs of infection present. He is wearing profon boots   Past Medical History:  Diagnosis Date  . AAA (abdominal aortic aneurysm), stable 04/20/2014  . Arthritis   . 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 CAD (coronary artery disease)    a. s/p cath in 2015 showing occluded RCA and high-grade LCx stenosis treated with DESx2 to LCx  . Cancer ([place])   . Depression   . Diverticulitis    hospital 2011 [facility]  . Diverticulosis   . GERD (gastroesophageal reflux disease)   . 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 Hernia of unspecified site of abdominal cavity without mention of obstruction or gangrene    hiatal  . Hyperlipidemia LDL goal <70 04/20/2014  . Hypertension   . IBS (irritable bowel syndrome)   . Myocardial infarction ([place]) 01/2014   NSTEMI  . Peripheral vascular disease (HCC)    ankle brachial index of 0.79 on the right and 0.61 on the left.   . 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 Reflex sympathetic dystrophy   . 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 SOB (shortness of breath)     Past Surgical History:  Procedure Laterality Date  . CARDIAC CATHETERIZATION    . CATARACT EXTRACTION    . CORONARY ANGIOPLASTY    . HERNIA REPAIR    . LEFT HEART CATHETERIZATION WITH CORONARY ANGIOGRAM N/A 01/17/2014   Procedure: LEFT HEART CATHETERIZATION WITH CORONARY ANGIOGRAM;  Surgeon: Leonie Man, MD;  Location: [facility] CATH LAB;  Service: Cardiovascular;   Laterality: N/A;  . left shoulder      x 3  . NISSEN FUNDOPLICATION    . right elbow surgery     x 2  . right knee arthroscopy    . squamous cell skin cancer     Left Hand  . SUPRAVENTRICULAR TACHYCARDIA ABLATION N/A 01/16/2014   Procedure: SUPRAVENTRICULAR TACHYCARDIA ABLATION;  Surgeon: Evans Lance, MD;  Location: [facility] CATH LAB;  Service: Cardiovascular;  Laterality: N/A;    Social History   Socioeconomic History  . Marital status: Widowed    Spouse name: Not on file  . Number of children: 2  . Years of education: 50  . Highest education level: Not on file  Occupational History  . Occupation: [employer]  . Financial resource strain: Not on file  . Food insecurity    Worry: Not on file    Inability: Not on file  . Transportation needs    Medical: Not on file    Non-medical: Not on file  Tobacco Use  . Smoking status: Former Smoker    Packs/day: 2.50    Years: 50.00    Pack years: 125.00    Types: Cigarettes    Quit date: 05/05/2005    Years since quitting: 13.7  . Smokeless tobacco: Former Systems developer    Quit date:  05/03/2012  Substance and Sexual Activity  . Alcohol use: No    Alcohol/week: 0.0 standard drinks    Comment: last use 2013  . Drug use: No  . Sexual activity: Not on file    Comment: widowed, Veteran  Lifestyle  . Physical activity    Days per week: Not on file    Minutes per session: Not on file  . Stress: Not on file  Relationships  . Social Herbalist on phone: Not on file    Gets together: Not on file    Attends religious service: Not on file    Active member of club or organization: Not on file    Attends meetings of clubs or organizations: Not on file    Relationship status: Not on file  . Intimate partner violence    Fear of current or ex partner: Not on file    Emotionally abused: Not on file    Physically abused: Not on file    Forced sexual activity: Not on file  Other Topics Concern  . Not on file  Social History  Narrative   Patient drinks 3 cups of caffeine daily.   Patient is right handed.   Family History  Problem Relation Age of Onset  . Colon cancer Mother   . Colon cancer Maternal Aunt   . Colon cancer Maternal Uncle   . Heart attack Neg Hx   . Stroke Neg Hx       VITAL SIGNS BP 119/76   Pulse (!) 102   Temp 97.9 F (36.6 C) (Oral)   Resp 17   Ht 6' (1.829 m)   Wt 196 lb (88.9 kg)   SpO2 94%   BMI 26.58 kg/m   Outpatient Encounter Medications as of 01/24/2019  Medication Sig  . albuterol (VENTOLIN HFA) 108 (90 Base) MCG/ACT inhaler Inhale 2 puffs into the lungs every 6 (six) hours as needed for wheezing or shortness of breath.  . alprazolam (XANAX) 2 MG tablet Take 1 tablet (2 mg total) by mouth 2 (two) times daily.  [name] amitriptyline (ELAVIL) 100 MG tablet Take 100 mg by mouth at bedtime.  [name] apixaban (ELIQUIS) 2.5 MG TABS tablet Take 5 mg by mouth 2 (two) times daily.   [name] atorvastatin (LIPITOR) 40 MG tablet Take 40 mg by mouth daily.  Roseanne Kaufman [place]-Castor Oil (VENELEX) OINT Apply 1 application topically See admin instructions. Apply to coccyx, sacrum, bilateral buttocks each shift and prn  . bisacodyl (DULCOLAX) 5 MG EC tablet Take 5 mg by mouth daily as needed for mild constipation or moderate constipation.  . budesonide-formoterol (SYMBICORT) 80-4.5 MCG/ACT inhaler Inhale 2 puffs into the lungs every morning.  . docusate sodium (COLACE) 100 MG capsule Take 100 mg by mouth daily as needed for mild constipation.  . finasteride (PROSCAR) 5 MG tablet Take 5 mg by mouth every morning.   . furosemide (LASIX) 40 MG tablet Take 1 tablet (40 mg total) by mouth daily.  [name] gabapentin (NEURONTIN) 800 MG tablet Take 1 tablet (800 mg total) by mouth 3 (three) times daily.  [name] guaiFENesin (MUCINEX) 600 MG 12 hr tablet Take 600 mg by mouth 2 (two) times daily.   [name] latanoprost (XALATAN) 0.005 % ophthalmic solution Place 1 drop into both eyes at bedtime.  . metoprolol tartrate (LOPRESSOR) 25 MG  tablet Take 12.5 mg by mouth 2 (two) times daily.  . nitroGLYCERIN (NITROSTAT) 0.4 MG SL tablet Place 1 tablet (0.4 mg total) under  the tongue every 5 (five) minutes x 3 doses as needed for chest pain.  . NON FORMULARY Diet: Regular, NAS, Consistent Carbohydrate  . OXYGEN Oxygen @@ 3L/mn via [place] Special Instructions: Document O2 sat qshift. Every Shift Day, Evening, Night  . potassium chloride (K-DUR) 10 MEQ tablet Take 1 tablet (10 mEq total) by mouth daily.  . tamsulosin (FLOMAX) 0.4 MG CAPS capsule Take 0.4 mg by mouth at bedtime.  . [DISCONTINUED] lamoTRIgine (LAMICTAL) 100 MG tablet Take 1 tablet (100 mg total) by mouth 2 (two) times daily.   No facility-administered encounter medications on file as of 01/24/2019.      SIGNIFICANT DIAGNOSTIC EXAMS   PREVIOUS;   01-01-19: chest x-ray:  1. Cardiomegaly with vascular congestion. 2. Small bilateral pleural effusions and bibasilar densities similar to prior radiograph  01-02-19: bilateral lower extremity venous doppler:  1. No evidence of deep venous thrombosis in either lower extremity. 2. Increased pulsatility of the venous waveforms suggests elevated right heart pressures. Findings can be seen in tricuspid regurgitation, right heart failure, pulmonary hypertension and COPD.  01-02-19: 2-d echo:   1. The left ventricle has normal systolic function, with an ejection fraction of 55-60%. The cavity size was normal. There is mildly increased left ventricular wall thickness. Left ventricular diastolic Doppler parameters are indeterminate.  2. The right ventricle has normal systolic function. The cavity was normal. There is no increase in right ventricular wall thickness. Right ventricular systolic pressure could not be assessed.  3. No evidence of mitral valve stenosis.  4. The aortic valve is tricuspid. Mild thickening of the aortic valve. Mild calcification of the aortic valve. No stenosis of the aortic valve.  5. The aorta is normal  unless otherwise noted.  6. The ascending aorta is normal in size and structure.  01-03-19: chest x-ray: Stable cardiomegaly, pulmonary vascular congestion, and bilateral pleural-parenchymal scarring. No acute findings.  NO NEW EXAMS.   LABS REVIEWED PREVIOUS;   01-01-19: wbc 8.2; hgb 14.2; hct 51.1; mcv 103.4; plt 216; glucose 100; bun 46; creat 1.88; k+ 5.8; na++ 142; ca 8.7; liver normal albumin 3.1 01-02-19: wbc 5.3; hgb 145; hct 51.8; mcv 101.2; plt 219; glucose 132; bun 45; creat 1.52; k+ 4.7; na++ 143; ca 9.0 ;liver normal albumin 3.3; tsh 0.435 vit B 12; 296; urine culture: staphylococcus species 01-05-19: glucose 129; bun 37; creat 0.98; k+ 4.1; na++ 134; ca 8.5; mag 1.9 01-10-19: glucose 111; bun 38; creat 0.85; k+ 3.5; na++ 130; ca 8.7   NO NEW LABS.   Review of Systems  Constitutional: Negative for malaise/fatigue.  Respiratory: Negative for cough and shortness of breath.   Cardiovascular: Negative for chest pain, palpitations and leg swelling.  Gastrointestinal: Negative for abdominal pain, constipation and heartburn.  Musculoskeletal: Negative for back pain, joint pain and myalgias.  Skin:       Sores   Neurological: Negative for dizziness.  Psychiatric/Behavioral: The patient is not nervous/anxious.     Physical Exam Constitutional:      General: He is not in acute distress.    Appearance: He is well-developed. He is not diaphoretic.  Eyes:     Comments: History of cataract extraction    Neck:     Musculoskeletal: Neck supple.     Thyroid: No thyromegaly.  Cardiovascular:     Rate and Rhythm: Normal rate and regular rhythm.     Pulses: Normal pulses.     Heart sounds: Normal heart sounds.     Comments:  History of  SVT ablation; ptca  Pulmonary:     Effort: Pulmonary effort is normal. No respiratory distress.     Breath sounds: Normal breath sounds.     Comments: 02 dependent  Abdominal:     General: Bowel sounds are normal. There is no distension.      Palpations: Abdomen is soft.     Tenderness: There is no abdominal tenderness.  Genitourinary:    Comments: Has foley  Musculoskeletal: Normal range of motion.  Lymphadenopathy:     Cervical: No cervical adenopathy.  Skin:    General: Skin is warm and dry.     Comments: Left heel DTI: 4.5 x 7 cm Right heel stage II: 1.5 x 2.0 cm  Without signs of infection present   Neurological:     Mental Status: He is alert and oriented to person, place, and time.  Psychiatric:        Mood and Affect: Mood normal.        ASSESSMENT/ PLAN:  TODAY  1. Left heel deep tissue injury 2. Stage 2 pressure ulcer of right heel  Will continue wound care as directed and will monitor his status.   MD is aware of resident's narcotic use and is in agreement with current plan of care. We will attempt to wean resident as appropriate.  Ok Edwards NP [facility]  Contact [phone] Monday through Friday 8am- 5pm  After hours call [phone]

## 2019-01-25 ENCOUNTER — Encounter: Payer: Self-pay | Admitting: Adult Health

## 2019-01-25 ENCOUNTER — Non-Acute Institutional Stay (SKILLED_NURSING_FACILITY): Payer: Medicare Other | Admitting: Adult Health

## 2019-01-25 DIAGNOSIS — E785 Hyperlipidemia, unspecified: Secondary | ICD-10-CM | POA: Diagnosis not present

## 2019-01-25 DIAGNOSIS — G609 Hereditary and idiopathic neuropathy, unspecified: Secondary | ICD-10-CM

## 2019-01-25 DIAGNOSIS — R339 Retention of urine, unspecified: Secondary | ICD-10-CM

## 2019-01-25 DIAGNOSIS — I739 Peripheral vascular disease, unspecified: Secondary | ICD-10-CM | POA: Diagnosis not present

## 2019-01-25 NOTE — Progress Notes (Signed)
: Provider:  Hennie Duos., MD Location:  [facility] Room Number: 102-P Place of Service:  SNF ([phone])  PCP: [name], MD Patient Care Team: [name], MD as PCP - General (Family Medicine) Herminio Commons, MD as PCP - Cardiology (Cardiology)  Extended Emergency Contact Information Primary Emergency Contact: Jutras,Mark Address: [place]          [place] Phone: [phone] Mobile Phone: [phone] Relation: Son Secondary Emergency Contact: [facility] Address: [address]          [place], [place] [address] [place] Phone: [phone] Mobile Phone: [phone] Relation: Other     Allergies: Codeine and Trileptal [oxcarbazepine]  Chief Complaint  Patient presents with   New Admit To SNF    New admission to [facility]    HPI: Patient is a 73 y.o. male with pulmonary asbestosis, COPD, CAD, hypertension, hyperlipidemia, SVT status post ablation who presented to the emergency department with altered mental status.  The patient had been admitted to the hospital from 8/12-16 for sepsis secondary to pneumonia, COPD exacerbation and acute on chronic respiratory failure.  He was discharged home with home health physical therapy and oral antibiotics and a prednisone taper.  Patient son said the patient had generalized weakness for about a week with some confusion that had worsened over the past 3 days.  On the day of admission patient was somnolent and difficult to arouse.  In the emergency department patient was placed on BiPAP, chest x-ray showed vascular congestion.  Patient was started on steroids and IV Lasix.  Patient was admitted to the hospital from 8/29-9/8 where he was treated for acute on chronic respiratory failure with hypoxia and hypercarbia secondary to pulmonary edema and COPD exacerbation.  Hospital course was complicated by SVT and atrial flutter and  patient was started on Eliquis.  Also patient had acute urinary retention and failed voiding trials so was discharged on Foley with follow-up with urology patient also had mild acute kidney injury and some hyperkalemia which resolved. patient also had left lower extremity edema and DVT was ruled out.  Patient is admitted to skilled nursing facility for OT/PT.  While at skilled nursing facility patient will be followed for coronary artery disease treated with metoprolol 12.5 mg twice daily and aspirin, hypertension treated with metoprolol and Lasix and anxiety and depression treated with Xanax and Elavil.  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 s/p cath in 2015 showing occluded RCA and high-grade LCx stenosis treated with DESx2 to LCx   Cancer [facility])    Depression    Diverticulitis    hospital 2011 [facility]   Diverticulosis    GERD (gastroesophageal reflux disease)    Glaucoma 2016   bilateral   Hernia of unspecified site of abdominal cavity without mention of obstruction or gangrene    hiatal   Hyperlipidemia LDL goal <70 04/20/2014   Hypertension    IBS (irritable bowel syndrome)    Myocardial infarction ([place]) 01/2014   NSTEMI   Peripheral vascular disease (HCC)    ankle brachial index of 0.79 on the right and 0.61 on the left.    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  SOB (shortness of breath)     Past Surgical History:  Procedure Laterality Date   CARDIAC CATHETERIZATION     CATARACT EXTRACTION     CORONARY ANGIOPLASTY     HERNIA REPAIR     LEFT HEART CATHETERIZATION WITH CORONARY ANGIOGRAM N/A 01/17/2014   Procedure: LEFT HEART CATHETERIZATION WITH CORONARY ANGIOGRAM;  Surgeon: Leonie Man, MD;  Location: [facility] CATH LAB;  Service: Cardiovascular;  Laterality:  N/A;   left shoulder      x 3   NISSEN FUNDOPLICATION     right elbow surgery     x 2   right knee arthroscopy     squamous cell skin cancer     Left Hand   SUPRAVENTRICULAR TACHYCARDIA ABLATION N/A 01/16/2014   Procedure: SUPRAVENTRICULAR TACHYCARDIA ABLATION;  Surgeon: Evans Lance, MD;  Location: [facility] CATH LAB;  Service: Cardiovascular;  Laterality: N/A;    Allergies as of 01/26/2019      Reactions   Codeine Itching   Trileptal [oxcarbazepine]    Other reaction(s): Other (See Comments) Abnormal sodium levels Hyponatremia      Medication List    Notice   This visit is during an admission. Changes to the med list made in this visit will be reflected in the After Visit Summary of the admission.     No orders of the defined types were placed in this encounter.   There is no immunization history for the selected administration types on file for this patient.  Social History   Tobacco Use   Smoking status: Former Smoker    Packs/day: 2.50    Years: 50.00    Pack years: 125.00    Types: Cigarettes    Quit date: 05/05/2005    Years since quitting: 13.7   Smokeless tobacco: Former Systems developer    Quit date: 05/03/2012  Substance Use Topics   Alcohol use: No    Alcohol/week: 0.0 standard drinks    Comment: last use 2013    Family history is   Family History  Problem Relation Age of Onset   Colon cancer Mother    Colon cancer Maternal Aunt    Colon cancer Maternal Uncle    Heart attack Neg Hx    Stroke Neg Hx       Review of Systems  DATA OBTAINED: from patient GENERAL:  no fevers, fatigue, appetite changes SKIN: No itching, or rash EYES: No eye pain, redness, discharge EARS: No earache, tinnitus, change in hearing NOSE: No congestion, drainage or bleeding  MOUTH/THROAT: No mouth or tooth pain, No sore throat RESPIRATORY: No cough, wheezing, SOB CARDIAC: No chest pain, palpitations, lower extremity edema  GI: No abdominal pain, No N/V/D or  constipation, No heartburn or reflux  GU: No dysuria, frequency or urgency, or incontinence  MUSCULOSKELETAL: No unrelieved bone/joint pain NEUROLOGIC: No headache, dizziness or focal weakness PSYCHIATRIC: No c/o anxiety or sadness   Vitals:   01/26/19 0901  BP: 133/67  Pulse: 78  Resp: 20  Temp: 98.4 F (36.9 C)  SpO2: 93%    SpO2 Readings from Last 1 Encounters:  01/27/19 (!) 88%   Body mass index is 26.77 kg/m.     Physical Exam  GENERAL APPEARANCE: Alert, conversant,  No acute distress.  SKIN: No diaphoresis rash HEAD: Normocephalic, atraumatic  EYES: Conjunctiva/lids clear. Pupils round, reactive. EOMs intact.  EARS: External exam WNL, canals clear. Hearing grossly normal.  NOSE: No deformity or discharge.  MOUTH/THROAT: Lips w/o lesions  RESPIRATORY: Breathing is  even, unlabored. Lung sounds are clear   CARDIOVASCULAR: Heart RRR no murmurs, rubs or gallops. 1+ peripheral edema.   GASTROINTESTINAL: Abdomen is soft, non-tender, not distended w/ normal bowel sounds. GENITOURINARY: Bladder non tender, not distended  MUSCULOSKELETAL: No abnormal joints or musculature NEUROLOGIC:  Cranial nerves 2-12 grossly intact. Moves all extremities  PSYCHIATRIC: Mood and affect appropriate to situation, no behavioral issues  Patient Active Problem List   Diagnosis Date Noted   Pressure injury of deep tissue of left heel 01/27/2019   Atrial flutter ([place]) 01/15/2019   Hypokalemia 01/15/2019   Bilateral open angle glaucoma 01/15/2019   Anxiety disorder [id number]   Acute diastolic CHF (congestive heart failure) ([place]) 01/03/2019   Pressure ulcer of right heel, stage 2 ([place]) 01/03/2019   COPD exacerbation ([place]) 01/02/2019   Acute on chronic respiratory failure with hypoxia and hypercapnia (HCC) 01/02/2019   Acute CHF (congestive heart failure) ([place]) 01/02/2019   Hyperkalemia 01/02/2019   Sepsis ([place]) 12/15/2018   Carotid artery disease ([place]) 02/27/2016   Acute  hyponatremia 02/12/2015   Hereditary and idiopathic peripheral neuropathy 01/30/2015   Acute on chronic respiratory failure ([place]) 05/08/2014   Anemia, iron deficiency 05/08/2014   Hyponatremia 05/08/2014   Urinary retention 05/08/2014   Chronic respiratory failure with hypoxia (HCC) 04/30/2014   Shortness of breath    HCAP (healthcare-associated pneumonia) 04/24/2014   Hyperlipidemia LDL goal <70 04/20/2014   AAA (abdominal aortic aneurysm), stable 04/20/2014   Abdominal pain, generalized    SOB (shortness of breath)    Peripheral vascular disease (HCC)    Presence of drug coated stent in left circumflex coronary artery: Promus DES 3.5 mm x 38 mm (3.9 mm) 01/17/2014    Class: Acute   CAD (coronary artery disease), native coronary artery    SVT (supraventricular tachycardia) s/p ablation 01/16/2014 01/16/2014   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   TIA (transient ischemic attack) 09/06/2013   HTN (hypertension) 09/05/2013   AKI (acute kidney injury) ([place]) 11/01/2012   TOBACCO ABUSE, hx 08/23/2007   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   Asbestosis ([place]) 08/12/2007   Reflex sympathetic dystrophy 05/29/2007   GERD 05/29/2007      Labs reviewed: Basic Metabolic Panel:    Component Value Date/Time   NA 134 (L) 01/28/2019 0010   K 4.5 01/28/2019 0010   CL 91 (L) 01/28/2019 0010   CO2 33 (H) 01/28/2019 0010   GLUCOSE 125 (H) 01/28/2019 0010   BUN 20 01/28/2019 0010   CREATININE 1.08 01/28/2019 0010   CALCIUM 8.9 01/28/2019 0010   PROT 6.8 01/02/2019 0957   PROT 7.6 01/30/2015 1455   ALBUMIN 3.3 (L) 01/02/2019 0957   AST 26 01/02/2019 0957   ALT 36 01/02/2019 0957   ALKPHOS 80 01/02/2019 0957   BILITOT 0.9 01/02/2019 0957   GFRNONAA >60 01/28/2019 0010   GFRAA >60 01/28/2019 0010    Recent Labs    01/03/19 0425  01/04/19 0525 01/05/19 0649  01/07/19 0832 01/10/19 0511 01/28/19 0010  NA 139  --  137 134*   < > 133*  130* 134*  K 4.6   < > 4.1 4.1   < > 3.6 3.5 4.5  CL 94*  --  90* 93*   < > 81* 84* 91*  CO2 35*  --  36* 36*   < > 39* 37* 33*  GLUCOSE 144*  --  150* 129*   < > 77 111* 125*  BUN 45*  --  45*  37*   < > 35* 38* 20  CREATININE 1.32*  --  1.11 0.98   < > 0.99 0.85 1.08  CALCIUM 8.6*  --  8.5* 8.5*   < > 9.0 8.7* 8.9  MG 1.9  --  1.9 1.9  --   --   --   --    < > = values in this interval not displayed.   Liver Function Tests: Recent Labs    12/16/18 0330 01/01/19 2235 01/02/19 0957  AST 13* 28 26  ALT 10 32 36  ALKPHOS 69 72 80  BILITOT 0.5 0.6 0.9  PROT 6.7 6.6 6.8  ALBUMIN 2.7* 3.1* 3.3*   No results for input(s): LIPASE, AMYLASE in the last 8760 hours. No results for input(s): AMMONIA in the last 8760 hours. CBC: Recent Labs    12/15/18 1551  01/01/19 2327 01/02/19 0606 01/02/19 0957 01/28/19 0010  WBC 15.0*   < > 8.2  --  5.3 8.6  NEUTROABS 12.4*  --  6.6  --   --  6.7  HGB 14.9   < > 14.2  --  14.5 11.0*  HCT 50.2   < > 51.1 49.7 51.8 36.1*  MCV 92.8   < > 103.4*  --  101.2* 93.0  PLT 260   < > 216  --  219 277   < > = values in this interval not displayed.   Lipid No results for input(s): CHOL, HDL, LDLCALC, TRIG in the last 8760 hours.  Cardiac Enzymes: No results for input(s): CKTOTAL, CKMB, CKMBINDEX, TROPONINI in the last 8760 hours. BNP: Recent Labs    01/01/19 2327  BNP 885.0*   No results found for: [facility] Lab Results  Component Value Date   HGBA1C 6.2 (H) 09/07/2013   Lab Results  Component Value Date   TSH 0.435 01/02/2019   Lab Results  Component Value Date   [id number] 01/02/2019   Lab Results  Component Value Date   FOLATE 6.2 01/04/2019   No results found for: IRON, TIBC, FERRITIN  Imaging and Procedures obtained prior to SNF admission: No results found.   Not all labs, radiology exams or other studies done during hospitalization come through on my EPIC note; however they are reviewed by me.    Assessment and  Plan  Acute on chronic respiratory failure with hypoxia and hypercapnia-secondary to pulmonary edema and COPD exacerbation chest x-ray with bilateral pleural plaques, bilateral pleural effusion right greater than left; patient was weaned off BiPAP and using 3 to 4 L which is patient's baseline SNF- admitted for OT/PT; continue O2 3 to 4 L nasal cannula  COPD exacerbation SNF- continue Pulmicort and steroid taper along with guaifenesin  Acute diastolic congestive heart failure; echo with a EF of 55 to 60% with normal RV function indeterminate diastolic function SNF- continue Lasix 40 mg daily  SVT/atrial flutter-personally reviewed EKG consistent with atrial flutter; cardiology in setting of prior SVT with ablation congestive heart failure recommended continue metoprolol and start the use of Eliquis; replete electrolytes of course SNF- continue metoprolol 12.5 mg twice daily  Urinary retention- pull Foley was placed and patient failed voiding trial 9 1 and also 9/3 so is being discharged to SNF with Foley SNF- continue Foley until follow-up with outpatient urology; continue Proscar 5 mg daily and Flomax 0.4 mg daily  AKI/hyperkalemia- baseline creatinine 0.9-1.2 with a presenting creatinine of 1.8 with hyperkalemia, both of which resolved with IV hydration SNF- follow-up CMP  CAD  SNF-continue metoprolol 12.5 mg twice daily and ASA 81 mg daily; patient has PRN nitroglycerin sublingual  Hypertension SNF-controlled; continue Norvasc 2.5 mg daily and Lasix 40 mg daily along with metoprolol 12.5 mg twice daily  Anxiety/depression SNF- appears controlled; continue Elavil 100 mg daily and Xanax 2 mg twice daily   Time spent greater than 45 minutes;> 50% of time with patient was spent reviewing records, labs, tests and studies, counseling and developing plan of care  Hennie Duos, MD

## 2019-01-25 NOTE — Progress Notes (Signed)
Location:    [place] Room Number: 157/D Place of Service:  SNF (31)   CODE STATUS: Full Code  Allergies  Allergen Reactions  . Codeine Itching  . Trileptal [Oxcarbazepine]     Other reaction(s): Other (See Comments) Abnormal sodium levels Hyponatremia    Chief Complaint  Patient presents with  . Medical Management of Chronic Issues       Peripheral vascular disease/heredity and idiopathic peripheral neuropathy:  Urinary retention:  Hyperlipidemia:  Weekly follow up for the first 30 days post hospitalization.       HPI:  He is a 73 year old short term rehab patient being seen for the management of his chronic illnesses: pvd; peripheral neuropathy; urine retention; hyperlipidemia. He continues to participate in therapy. He denies any uncontrolled pain. He denies any cough or shortness of breath. He denies any changes in his appetite.   Past Medical History:  Diagnosis Date  . AAA (abdominal aortic aneurysm), stable 04/20/2014  . Arthritis   . 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 CAD (coronary artery disease)    a. s/p cath in 2015 showing occluded RCA and high-grade LCx stenosis treated with DESx2 to LCx  . Cancer ([place])   . Depression   . Diverticulitis    hospital 2011 [facility]  . Diverticulosis   . GERD (gastroesophageal reflux disease)   . 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 Hernia of unspecified site of abdominal cavity without mention of obstruction or gangrene    hiatal  . Hyperlipidemia LDL goal <70 04/20/2014  . Hypertension   . IBS (irritable bowel syndrome)   . Myocardial infarction ([place]) 01/2014   NSTEMI  . Peripheral vascular disease (HCC)    ankle brachial index of 0.79 on the right and 0.61 on the left.   . 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 Reflex sympathetic dystrophy   . 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 SOB (shortness of breath)     Past Surgical History:  Procedure Laterality Date  . CARDIAC CATHETERIZATION    . CATARACT  EXTRACTION    . CORONARY ANGIOPLASTY    . HERNIA REPAIR    . LEFT HEART CATHETERIZATION WITH CORONARY ANGIOGRAM N/A 01/17/2014   Procedure: LEFT HEART CATHETERIZATION WITH CORONARY ANGIOGRAM;  Surgeon: Leonie Man, MD;  Location: [facility] CATH LAB;  Service: Cardiovascular;  Laterality: N/A;  . left shoulder      x 3  . NISSEN FUNDOPLICATION    . right elbow surgery     x 2  . right knee arthroscopy    . squamous cell skin cancer     Left Hand  . SUPRAVENTRICULAR TACHYCARDIA ABLATION N/A 01/16/2014   Procedure: SUPRAVENTRICULAR TACHYCARDIA ABLATION;  Surgeon: Evans Lance, MD;  Location: [facility] CATH LAB;  Service: Cardiovascular;  Laterality: N/A;    Social History   Socioeconomic History  . Marital status: Widowed    Spouse name: Not on file  . Number of children: 2  . Years of education: 35  . Highest education level: Not on file  Occupational History  . Occupation: [employer]  . Financial resource strain: Not on file  . Food insecurity    Worry: Not on file    Inability: Not on file  . Transportation needs    Medical: Not on file    Non-medical: Not on file  Tobacco Use  . Smoking status: Former Smoker    Packs/day: 2.50    Years:  50.00    Pack years: 125.00    Types: Cigarettes    Quit date: 05/05/2005    Years since quitting: 13.7  . Smokeless tobacco: Former Systems developer    Quit date: 05/03/2012  Substance and Sexual Activity  . Alcohol use: No    Alcohol/week: 0.0 standard drinks    Comment: last use 2013  . Drug use: No  . Sexual activity: Not on file    Comment: widowed, Veteran  Lifestyle  . Physical activity    Days per week: Not on file    Minutes per session: Not on file  . Stress: Not on file  Relationships  . Social Herbalist on phone: Not on file    Gets together: Not on file    Attends religious service: Not on file    Active member of club or organization: Not on file    Attends meetings of clubs or organizations: Not on file     Relationship status: Not on file  . Intimate partner violence    Fear of current or ex partner: Not on file    Emotionally abused: Not on file    Physically abused: Not on file    Forced sexual activity: Not on file  Other Topics Concern  . Not on file  Social History Narrative   Patient drinks 3 cups of caffeine daily.   Patient is right handed.   Family History  Problem Relation Age of Onset  . Colon cancer Mother   . Colon cancer Maternal Aunt   . Colon cancer Maternal Uncle   . Heart attack Neg Hx   . Stroke Neg Hx       VITAL SIGNS BP 133/67   Pulse 78   Temp 98.4 F (36.9 C) (Oral)   Resp 20   Ht 6' (1.829 m)   Wt 197 lb 6.4 oz (89.5 kg)   SpO2 92%   BMI 26.77 kg/m   Outpatient Encounter Medications as of 01/25/2019  Medication Sig  . albuterol (VENTOLIN HFA) 108 (90 Base) MCG/ACT inhaler Inhale 2 puffs into the lungs every 6 (six) hours as needed for wheezing or shortness of breath.  . alprazolam (XANAX) 2 MG tablet Take 1 tablet (2 mg total) by mouth 2 (two) times daily.  [name] amitriptyline (ELAVIL) 100 MG tablet Take 100 mg by mouth at bedtime.  [name] apixaban (ELIQUIS) 2.5 MG TABS tablet Take 5 mg by mouth 2 (two) times daily.   [name] atorvastatin (LIPITOR) 40 MG tablet Take 40 mg by mouth daily.  Roseanne Kaufman Peru-Castor Oil (VENELEX) OINT Apply 1 application topically See admin instructions. Apply to coccyx, sacrum, bilateral buttocks each shift and prn  . bisacodyl (DULCOLAX) 5 MG EC tablet Take 5 mg by mouth daily as needed for mild constipation or moderate constipation.  . budesonide-formoterol (SYMBICORT) 80-4.5 MCG/ACT inhaler Inhale 2 puffs into the lungs every morning.  . docusate sodium (COLACE) 100 MG capsule Take 100 mg by mouth daily as needed for mild constipation.  . finasteride (PROSCAR) 5 MG tablet Take 5 mg by mouth every morning.   . furosemide (LASIX) 40 MG tablet Take 1 tablet (40 mg total) by mouth daily.  [name] gabapentin (NEURONTIN) 800 MG tablet Take 1  tablet (800 mg total) by mouth 3 (three) times daily.  [name] guaiFENesin (MUCINEX) 600 MG 12 hr tablet Take 600 mg by mouth 2 (two) times daily.   [name] latanoprost (XALATAN) 0.005 % ophthalmic solution Place 1 drop  into both eyes at bedtime.  . metoprolol tartrate (LOPRESSOR) 25 MG tablet Take 12.5 mg by mouth 2 (two) times daily.  . nitroGLYCERIN (NITROSTAT) 0.4 MG SL tablet Place 1 tablet (0.4 mg total) under the tongue every 5 (five) minutes x 3 doses as needed for chest pain.  . NON FORMULARY Diet: Regular, NAS, Consistent Carbohydrate  . OXYGEN Oxygen @@ 3L/mn via [place] Special Instructions: Document O2 sat qshift. Every Shift Day, Evening, Night  . potassium chloride (K-DUR) 10 MEQ tablet Take 1 tablet (10 mEq total) by mouth daily.  . tamsulosin (FLOMAX) 0.4 MG CAPS capsule Take 0.4 mg by mouth at bedtime.  . [DISCONTINUED] lamoTRIgine (LAMICTAL) 100 MG tablet Take 1 tablet (100 mg total) by mouth 2 (two) times daily.   No facility-administered encounter medications on file as of 01/25/2019.      SIGNIFICANT DIAGNOSTIC EXAMS   PREVIOUS;   01-01-19: chest x-ray:  1. Cardiomegaly with vascular congestion. 2. Small bilateral pleural effusions and bibasilar densities similar to prior radiograph  01-02-19: bilateral lower extremity venous doppler:  1. No evidence of deep venous thrombosis in either lower extremity. 2. Increased pulsatility of the venous waveforms suggests elevated right heart pressures. Findings can be seen in tricuspid regurgitation, right heart failure, pulmonary hypertension and COPD.  01-02-19: 2-d echo:   1. The left ventricle has normal systolic function, with an ejection fraction of 55-60%. The cavity size was normal. There is mildly increased left ventricular wall thickness. Left ventricular diastolic Doppler parameters are indeterminate.  2. The right ventricle has normal systolic function. The cavity was normal. There is no increase in right ventricular wall  thickness. Right ventricular systolic pressure could not be assessed.  3. No evidence of mitral valve stenosis.  4. The aortic valve is tricuspid. Mild thickening of the aortic valve. Mild calcification of the aortic valve. No stenosis of the aortic valve.  5. The aorta is normal unless otherwise noted.  6. The ascending aorta is normal in size and structure.  01-03-19: chest x-ray: Stable cardiomegaly, pulmonary vascular congestion, and bilateral pleural-parenchymal scarring. No acute findings.  NO NEW EXAMS.   LABS REVIEWED PREVIOUS;   01-01-19: wbc 8.2; hgb 14.2; hct 51.1; mcv 103.4; plt 216; glucose 100; bun 46; creat 1.88; k+ 5.8; na++ 142; ca 8.7; liver normal albumin 3.1 01-02-19: wbc 5.3; hgb 145; hct 51.8; mcv 101.2; plt 219; glucose 132; bun 45; creat 1.52; k+ 4.7; na++ 143; ca 9.0 ;liver normal albumin 3.3; tsh 0.435 vit B 12; 296; urine culture: staphylococcus species 01-05-19: glucose 129; bun 37; creat 0.98; k+ 4.1; na++ 134; ca 8.5; mag 1.9 01-10-19: glucose 111; bun 38; creat 0.85; k+ 3.5; na++ 130; ca 8.7   NO NEW LABS.    Review of Systems  Constitutional: Negative for malaise/fatigue.  Respiratory: Negative for cough and shortness of breath.   Cardiovascular: Negative for chest pain, palpitations and leg swelling.  Gastrointestinal: Negative for abdominal pain, constipation and heartburn.  Musculoskeletal: Negative for back pain, joint pain and myalgias.  Skin:       sores  Neurological: Negative for dizziness.  Psychiatric/Behavioral: The patient is not nervous/anxious.     Physical Exam Constitutional:      General: He is not in acute distress.    Appearance: He is well-developed. He is not diaphoretic.  Eyes:     Comments: History of cataract extraction     Neck:     Musculoskeletal: Neck supple.     Thyroid: No thyromegaly.  Cardiovascular:     Rate and Rhythm: Normal rate and regular rhythm.     Pulses: Normal pulses.     Heart sounds: Normal heart sounds.      Comments: History of SVT ablation; ptca  Pulmonary:     Effort: Pulmonary effort is normal. No respiratory distress.     Breath sounds: Normal breath sounds.     Comments: 02 dependent  Abdominal:     General: Bowel sounds are normal. There is no distension.     Palpations: Abdomen is soft.     Tenderness: There is no abdominal tenderness.  Genitourinary:    Comments: foley Musculoskeletal: Normal range of motion.  Lymphadenopathy:     Cervical: No cervical adenopathy.  Skin:    General: Skin is warm and dry.     Comments: Left heel DTI: 4.5 x 7 cm Right heel stage II: 1.5 x 2.0 cm  Without signs of infection present    Neurological:     Mental Status: He is alert and oriented to person, place, and time.  Psychiatric:        Mood and Affect: Mood normal.      ASSESSMENT/ PLAN:  TODAY;   1. Peripheral vascular disease/heredity and idiopathic peripheral neuropathy: is stable will continue asa 81 mg daily elavil 100 mg nightly and neurontin 800 mg three times daily   2. Urinary retention: is stable has foley has failed voiding trail twice daily; awaiting urology consult will continue proscar 5 mg and flomax 0.4 mg daily   3. Hyperlipidemia: LDL goal <70; is stable will continue lipitor 40 mg daily   PREVIOUS   4. Hypokalemia: is stable k+ 3.5 will continue k+ 10 meq daily   5. Primary open angle glaucoma of both eyes unspecified stage: is stable will continue xalatan to both eyes  6.  Generalized anxiety disorder: is stable will continue xanax 2 mg twice daily   7. Physical deconditioning multiple falls: is without change: will continue therapy as directed for gait balance strength and adl training.   8. Acute on chronic respiratory failure with hypoxia and hyyercapnia/ COPD with chronic bronchitis asbestosis: is stable is 02 dependent will continue albuterol 2 puffs every 6 hours as needed; symbicort 80/4.5 mcg 2 puffs twice daily mucinex 600 mg  twice daily   9.  Acute diastolic CHF (congestive heart failure) EF 55-60% (12-06-18) is stable will continue lasix 40 mg daily lopressor 12.5 mg twice daily   10. Atrial flutter/ SVT (suprventricular tachycardia) s/p ablation: heart rate is stable will continue asa 81 mg daily lopressor 12.5 mg twice daily for rate control.   11. Right heel stage 2 ulceration; left heel DTI: is stable will continue treatment as directed and will monitor      MD is aware of resident's narcotic use and is in agreement with current plan of care. We will attempt to wean resident as appropriate.  Ok Edwards NP [facility] Adult Medicine  Contact [phone] Monday through Friday 8am- 5pm  After hours call [phone]

## 2019-01-26 ENCOUNTER — Non-Acute Institutional Stay (SKILLED_NURSING_FACILITY): Payer: Medicare Other | Admitting: Internal Medicine

## 2019-01-26 DIAGNOSIS — I739 Peripheral vascular disease, unspecified: Secondary | ICD-10-CM

## 2019-01-26 DIAGNOSIS — J441 Chronic obstructive pulmonary disease with (acute) exacerbation: Secondary | ICD-10-CM

## 2019-01-26 DIAGNOSIS — N179 Acute kidney failure, unspecified: Secondary | ICD-10-CM

## 2019-01-26 DIAGNOSIS — F4323 Adjustment disorder with mixed anxiety and depressed mood: Secondary | ICD-10-CM

## 2019-01-26 DIAGNOSIS — I471 Supraventricular tachycardia: Secondary | ICD-10-CM | POA: Diagnosis not present

## 2019-01-26 DIAGNOSIS — J9621 Acute and chronic respiratory failure with hypoxia: Secondary | ICD-10-CM

## 2019-01-26 DIAGNOSIS — I779 Disorder of arteries and arterioles, unspecified: Secondary | ICD-10-CM

## 2019-01-26 DIAGNOSIS — J9622 Acute and chronic respiratory failure with hypercapnia: Secondary | ICD-10-CM

## 2019-01-26 DIAGNOSIS — I5031 Acute diastolic (congestive) heart failure: Secondary | ICD-10-CM | POA: Diagnosis not present

## 2019-01-26 DIAGNOSIS — I10 Essential (primary) hypertension: Secondary | ICD-10-CM

## 2019-01-26 DIAGNOSIS — R339 Retention of urine, unspecified: Secondary | ICD-10-CM

## 2019-01-26 DIAGNOSIS — E875 Hyperkalemia: Secondary | ICD-10-CM

## 2019-01-27 ENCOUNTER — Other Ambulatory Visit: Payer: Self-pay | Admitting: Adult Health

## 2019-01-27 ENCOUNTER — Non-Acute Institutional Stay (SKILLED_NURSING_FACILITY): Payer: Medicare Other | Admitting: Adult Health

## 2019-01-27 ENCOUNTER — Encounter: Payer: Self-pay | Admitting: Adult Health

## 2019-01-27 DIAGNOSIS — I4892 Unspecified atrial flutter: Secondary | ICD-10-CM

## 2019-01-27 DIAGNOSIS — J9611 Chronic respiratory failure with hypoxia: Secondary | ICD-10-CM

## 2019-01-27 DIAGNOSIS — F411 Generalized anxiety disorder: Secondary | ICD-10-CM | POA: Diagnosis not present

## 2019-01-27 DIAGNOSIS — L89626 Pressure-induced deep tissue damage of left heel: Secondary | ICD-10-CM | POA: Insufficient documentation

## 2019-01-27 MED ORDER — ALPRAZOLAM 2 MG PO TABS: 2.0000 mg | ORAL_TABLET | Freq: Three times a day (TID) | ORAL | 0 refills | Status: DC

## 2019-01-27 NOTE — Progress Notes (Signed)
Location:    [place] Room Number: 102/P Place of Service:  SNF (31)   CODE STATUS: Full Code  Allergies  Allergen Reactions   Codeine Itching   Trileptal [Oxcarbazepine]     Other reaction(s): Other (See Comments) Abnormal sodium levels Hyponatremia    Chief Complaint  Patient presents with   Acute Visit    Status    HPI:  He is complaining of worsening anxiety. He states he was taking xanax three times daily. He states that he is prone to panic attacks. He has had his lopressor increased due tachycardia; but his blood pressure is low. He has had shortness of breath with 02 sats in the 88-90. He denies any cough. Staff feel as though he will benefit from a spacer.   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 s/p cath in 2015 showing occluded RCA and high-grade LCx stenosis treated with DESx2 to LCx   Cancer [facility])    Depression    Diverticulitis    hospital 2011 [facility]   Diverticulosis    GERD (gastroesophageal reflux disease)    Glaucoma 2016   bilateral   Hernia of unspecified site of abdominal cavity without mention of obstruction or gangrene    hiatal   Hyperlipidemia LDL goal <70 04/20/2014   Hypertension    IBS (irritable bowel syndrome)    Myocardial infarction ([place]) 01/2014   NSTEMI   Peripheral vascular disease (HCC)    ankle brachial index of 0.79 on the right and 0.61 on the left.    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 CATH LAB;  Service: Cardiovascular;  Laterality: N/A;   left shoulder      x 3   NISSEN FUNDOPLICATION     right elbow surgery     x 2   right knee arthroscopy     squamous cell skin cancer     Left Hand   SUPRAVENTRICULAR TACHYCARDIA ABLATION N/A 01/16/2014   Procedure: SUPRAVENTRICULAR TACHYCARDIA ABLATION;  Surgeon: Evans Lance, MD;  Location: [facility] CATH LAB;  Service: Cardiovascular;  Laterality: N/A;    Social History   Socioeconomic History   Marital status: Widowed    Spouse name: Not on file   Number of children: 2   Years of education: 12   Highest education level: Not on file  Occupational History   Occupation: Associate Professor strain: Not on file   Food insecurity    Worry: Not on file    Inability: Not on file   Transportation needs    Medical: Not on file    Non-medical: Not on file  Tobacco Use   Smoking status: Former Smoker    Packs/day: 2.50    Years: 50.00    Pack years: 125.00    Types: Cigarettes    Quit date: 05/05/2005  Years since quitting: 13.7   Smokeless tobacco: Former Systems developer    Quit date: 05/03/2012  Substance and Sexual Activity   Alcohol use: No    Alcohol/week: 0.0 standard drinks    Comment: last use 2013   Drug use: No   Sexual activity: Not on file    Comment: widowed, Veteran  Lifestyle   Physical activity    Days per week: Not on file    Minutes per session: Not on file   Stress: Not on file  Relationships   Social connections    Talks on phone: Not on file    Gets together: Not on file    Attends religious service: Not on file    Active member of club or organization: Not on file    Attends meetings of clubs or organizations: Not on file    Relationship status: Not on file   Intimate partner violence    Fear of current or ex partner: Not on file    Emotionally abused: Not on file    Physically abused:  Not on file    Forced sexual activity: Not on file  Other Topics Concern   Not on file  Social History Narrative   Patient drinks 3 cups of caffeine daily.   Patient is right handed.   Family History  Problem Relation Age of Onset   Colon cancer Mother    Colon cancer Maternal Aunt    Colon cancer Maternal Uncle    Heart attack Neg Hx    Stroke Neg Hx       VITAL SIGNS BP (!) 99/56    Pulse 74    Temp 98.7 F (37.1 C) (Oral)    Resp 20    Ht 6' (1.829 m)    Wt 195 lb 3.2 oz (88.5 kg)    SpO2 (!) 88%    BMI 26.47 kg/m   Outpatient Encounter Medications as of 01/27/2019  Medication Sig   alprazolam (XANAX) 2 MG tablet Take 2 mg by mouth 2 (two) times daily.   Amino Acids-Protein Hydrolys (FEEDING SUPPLEMENT, PRO-STAT SUGAR FREE 64,) LIQD Take 30 mLs by mouth daily.   amitriptyline (ELAVIL) 100 MG tablet Take 100 mg by mouth at bedtime.   apixaban (ELIQUIS) 2.5 MG TABS tablet Take 5 mg by mouth 2 (two) times daily.    atorvastatin (LIPITOR) 40 MG tablet Take 40 mg by mouth daily.   Balsam [place]-Castor Oil (VENELEX) OINT Apply 1 application topically See admin instructions. Apply to coccyx, sacrum, bilateral buttocks each shift and prn   bisacodyl (DULCOLAX) 5 MG EC tablet Take 5 mg by mouth daily as needed for mild constipation or moderate constipation.   budesonide-formoterol (SYMBICORT) 80-4.5 MCG/ACT inhaler Inhale 2 puffs into the lungs every morning.   docusate sodium (COLACE) 100 MG capsule Take 100 mg by mouth daily as needed for mild constipation.   finasteride (PROSCAR) 5 MG tablet Take 5 mg by mouth every morning.    furosemide (LASIX) 40 MG tablet Take 1 tablet (40 mg total) by mouth daily.   gabapentin (NEURONTIN) 800 MG tablet Take 1 tablet (800 mg total) by mouth 3 (three) times daily.   guaiFENesin (MUCINEX) 600 MG 12 hr tablet Take 600 mg by mouth 2 (two) times daily.    hydrocortisone cream 1 % Apply 1 application topically 2 (two) times daily.  Apply sparingtly   ipratropium-albuterol (DUONEB) 0.5-2.5 (3) MG/3ML SOLN Take 3 mLs by nebulization every 4 (four) hours as needed (For  wheezing and worsening shortness of breath or respiratory distress).   latanoprost (XALATAN) 0.005 % ophthalmic solution Place 1 drop into both eyes at bedtime.   metoprolol tartrate (LOPRESSOR) 25 MG tablet Take 25 mg by mouth 2 (two) times daily.    nitroGLYCERIN (NITROSTAT) 0.4 MG SL tablet Place 1 tablet (0.4 mg total) under the tongue every 5 (five) minutes x 3 doses as needed for chest pain.   NON FORMULARY Diet: Regular, NAS, Consistent Carbohydrate   OXYGEN Oxygen @@ 3L/mn via [place] Special Instructions: Document O2 sat qshift. Every Shift Day, Evening, Night   potassium chloride (K-DUR) 10 MEQ tablet Take 1 tablet (10 mEq total) by mouth daily.   tamsulosin (FLOMAX) 0.4 MG CAPS capsule Take 0.4 mg by mouth at bedtime.   [DISCONTINUED] albuterol (VENTOLIN HFA) 108 (90 Base) MCG/ACT inhaler Inhale 2 puffs into the lungs every 6 (six) hours as needed for wheezing or shortness of breath.   [DISCONTINUED] alprazolam (XANAX) 2 MG tablet Take 1 tablet (2 mg total) by mouth 3 (three) times daily. (Patient taking differently: Take 2 mg by mouth 2 (two) times daily. )   [DISCONTINUED] lamoTRIgine (LAMICTAL) 100 MG tablet Take 1 tablet (100 mg total) by mouth 2 (two) times daily.   No facility-administered encounter medications on file as of 01/27/2019.      SIGNIFICANT DIAGNOSTIC EXAMS  PREVIOUS;   01-01-19: chest x-ray:  1. Cardiomegaly with vascular congestion. 2. Small bilateral pleural effusions and bibasilar densities similar to prior radiograph  01-02-19: bilateral lower extremity venous doppler:  1. No evidence of deep venous thrombosis in either lower extremity. 2. Increased pulsatility of the venous waveforms suggests elevated right heart pressures. Findings can be seen in tricuspid regurgitation, right heart failure, pulmonary  hypertension and COPD.  01-02-19: 2-d echo:   1. The left ventricle has normal systolic function, with an ejection fraction of 55-60%. The cavity size was normal. There is mildly increased left ventricular wall thickness. Left ventricular diastolic Doppler parameters are indeterminate.  2. The right ventricle has normal systolic function. The cavity was normal. There is no increase in right ventricular wall thickness. Right ventricular systolic pressure could not be assessed.  3. No evidence of mitral valve stenosis.  4. The aortic valve is tricuspid. Mild thickening of the aortic valve. Mild calcification of the aortic valve. No stenosis of the aortic valve.  5. The aorta is normal unless otherwise noted.  6. The ascending aorta is normal in size and structure.  01-03-19: chest x-ray: Stable cardiomegaly, pulmonary vascular congestion, and bilateral pleural-parenchymal scarring. No acute findings.  NO NEW EXAMS.   LABS REVIEWED PREVIOUS;   01-01-19: wbc 8.2; hgb 14.2; hct 51.1; mcv 103.4; plt 216; glucose 100; bun 46; creat 1.88; k+ 5.8; na++ 142; ca 8.7; liver normal albumin 3.1 01-02-19: wbc 5.3; hgb 145; hct 51.8; mcv 101.2; plt 219; glucose 132; bun 45; creat 1.52; k+ 4.7; na++ 143; ca 9.0 ;liver normal albumin 3.3; tsh 0.435 vit B 12; 296; urine culture: staphylococcus species 01-05-19: glucose 129; bun 37; creat 0.98; k+ 4.1; na++ 134; ca 8.5; mag 1.9 01-10-19: glucose 111; bun 38; creat 0.85; k+ 3.5; na++ 130; ca 8.7   NO NEW LABS.   Review of Systems  Constitutional: Negative for malaise/fatigue.  Respiratory: Positive for shortness of breath. Negative for cough.   Cardiovascular: Negative for chest pain, palpitations and leg swelling.  Gastrointestinal: Negative for abdominal pain, constipation and heartburn.  Musculoskeletal: Negative for back pain, joint pain and  myalgias.  Skin: Negative.   Neurological: Negative for dizziness.  Psychiatric/Behavioral: The patient is  nervous/anxious. The patient does not have insomnia.     Physical Exam Constitutional:      General: He is not in acute distress.    Appearance: He is well-developed. He is not diaphoretic.  Eyes:     Comments: History of cataract extraction      Neck:     Musculoskeletal: Neck supple.     Thyroid: No thyromegaly.  Cardiovascular:     Rate and Rhythm: Normal rate and regular rhythm.     Pulses: Normal pulses.     Heart sounds: Normal heart sounds.     Comments: History of SVT ablation; ptca  Pulmonary:     Effort: Pulmonary effort is normal. No respiratory distress.     Breath sounds: Normal breath sounds.     Comments: 02 dependent  Abdominal:     General: Bowel sounds are normal. There is no distension.     Palpations: Abdomen is soft.     Tenderness: There is no abdominal tenderness.  Genitourinary:    Comments: Foley  Musculoskeletal: Normal range of motion.     Right lower leg: No edema.     Left lower leg: No edema.  Lymphadenopathy:     Cervical: No cervical adenopathy.  Skin:    General: Skin is warm and dry.     Comments:  Left heel DTI: 4.5 x 7 cm Right heel stage II: 1.5 x 2.0 cm  Without signs of infection present     Neurological:     Mental Status: He is alert and oriented to person, place, and time.  Psychiatric:        Mood and Affect: Mood normal.     ASSESSMENT/ PLAN:  TODAY  1. Atrial flutter unspecified type 2. Chronic respiratory failure with hypoxia 3. Generalized anxiety disorder  Is worse Will increase xanax to 2 mg three times daily  Will get a spacer for his inhalers Will hold lopressor for systolic b/p [id number] Will monitor his status.       MD is aware of resident's narcotic use and is in agreement with current plan of care. We will attempt to wean resident as appropriate.  Ok Edwards NP [facility] Adult Medicine  Contact [phone] Monday through Friday 8am- 5pm  After hours call [phone]

## 2019-01-28 ENCOUNTER — Telehealth: Payer: Self-pay | Admitting: Adult Health

## 2019-01-28 ENCOUNTER — Ambulatory Visit (HOSPITAL_COMMUNITY)
Admission: RE | Admit: 2019-01-28 | Discharge: 2019-01-28 | Disposition: A | Discharge status: Home / Self Care | Payer: Medicare Other | Source: Ambulatory Visit | Attending: Internal Medicine | Admitting: Internal Medicine

## 2019-01-28 ENCOUNTER — Other Ambulatory Visit (HOSPITAL_COMMUNITY)
Admission: AD | Admit: 2019-01-28 | Discharge: 2019-01-28 | Disposition: A | Discharge status: Home / Self Care | Payer: Medicare Other | Source: Skilled Nursing Facility | Attending: *Deleted | Admitting: *Deleted

## 2019-01-28 DIAGNOSIS — Z20828 Contact with and (suspected) exposure to other viral communicable diseases: Secondary | ICD-10-CM | POA: Insufficient documentation

## 2019-01-28 DIAGNOSIS — R05 Cough: Secondary | ICD-10-CM | POA: Insufficient documentation

## 2019-01-28 DIAGNOSIS — R5381 Other malaise: Secondary | ICD-10-CM | POA: Insufficient documentation

## 2019-01-28 DIAGNOSIS — R509 Fever, unspecified: Secondary | ICD-10-CM | POA: Insufficient documentation

## 2019-01-28 LAB — CBC WITH DIFFERENTIAL/PLATELET
Abs Immature Granulocytes: 0.19 10*3/uL — ABNORMAL HIGH (ref 0.00–0.07)
Basophils Absolute: 0.1 10*3/uL (ref 0.0–0.1)
Basophils Relative: 1 %
Eosinophils Absolute: 0.2 10*3/uL (ref 0.0–0.5)
Eosinophils Relative: 2 %
HCT: 36.1 % — ABNORMAL LOW (ref 39.0–52.0)
Hemoglobin: 11 g/dL — ABNORMAL LOW (ref 13.0–17.0)
Immature Granulocytes: 2 %
Lymphocytes Relative: 8 %
Lymphs Abs: 0.7 10*3/uL (ref 0.7–4.0)
MCH: 28.4 pg (ref 26.0–34.0)
MCHC: 30.5 g/dL (ref 30.0–36.0)
MCV: 93 fL (ref 80.0–100.0)
Monocytes Absolute: 0.7 10*3/uL (ref 0.1–1.0)
Monocytes Relative: 8 %
Neutro Abs: 6.7 10*3/uL (ref 1.7–7.7)
Neutrophils Relative %: 79 %
Platelets: 277 10*3/uL (ref 150–400)
RBC: 3.88 MIL/uL — ABNORMAL LOW (ref 4.22–5.81)
RDW: 17.4 % — ABNORMAL HIGH (ref 11.5–15.5)
WBC: 8.6 10*3/uL (ref 4.0–10.5)
nRBC: 0 % (ref 0.0–0.2)

## 2019-01-28 LAB — SARS CORONAVIRUS 2 BY RT PCR (HOSPITAL ORDER, PERFORMED IN ~~LOC~~ HOSPITAL LAB): SARS Coronavirus 2: NEGATIVE

## 2019-01-28 LAB — BASIC METABOLIC PANEL
Anion gap: 10 (ref 5–15)
BUN: 20 mg/dL (ref 8–23)
CO2: 33 mmol/L — ABNORMAL HIGH (ref 22–32)
Calcium: 8.9 mg/dL (ref 8.9–10.3)
Chloride: 91 mmol/L — ABNORMAL LOW (ref 98–111)
Creatinine, Ser: 1.08 mg/dL (ref 0.61–1.24)
GFR calc Af Amer: >60 mL/min (ref >60)
GFR calc non Af Amer: >60 mL/min (ref >60)
Glucose, Bld: 125 mg/dL — ABNORMAL HIGH (ref 70–99)
Potassium: 4.5 mmol/L (ref 3.5–5.1)
Sodium: 134 mmol/L — ABNORMAL LOW (ref 135–145)

## 2019-01-28 LAB — URINALYSIS, ROUTINE W REFLEX MICROSCOPIC
Bilirubin Urine: NEGATIVE
Glucose, UA: NEGATIVE mg/dL
Hgb urine dipstick: NEGATIVE
Ketones, ur: NEGATIVE mg/dL
Nitrite: NEGATIVE
Protein, ur: 30 mg/dL — AB
Specific Gravity, Urine: 1.019 (ref 1.005–1.030)
pH: 5 (ref 5.0–8.0)

## 2019-01-28 NOTE — Telephone Encounter (Signed)
I received a phone call from Inez Catalina, the nurse at [facility] nsg center. She reported that this resident had a temp on 9/24 of 101. He also had sats of 88% per epic notes. Labs, urine, covid, and CXR.  This morning his temp is 98.9 per nurse and appears stable. CXR is showing some worsening in prior exam ?pna.  Given the above symptoms and his complex medical hx will begin Levaquin 500 mg qd x 7 days and Florastor 1 cap bid x 7 days. Continue to monitor resident and report if no improvement.

## 2019-01-29 ENCOUNTER — Encounter: Payer: Self-pay | Admitting: Internal Medicine

## 2019-01-29 LAB — URINE CULTURE: Culture: NO GROWTH

## 2019-02-01 ENCOUNTER — Non-Acute Institutional Stay (SKILLED_NURSING_FACILITY): Payer: Medicare Other | Admitting: Adult Health

## 2019-02-01 ENCOUNTER — Encounter: Payer: Self-pay | Admitting: Adult Health

## 2019-02-01 DIAGNOSIS — R5381 Other malaise: Secondary | ICD-10-CM

## 2019-02-01 DIAGNOSIS — J189 Pneumonia, unspecified organism: Secondary | ICD-10-CM | POA: Diagnosis not present

## 2019-02-01 DIAGNOSIS — H401130 Primary open-angle glaucoma, bilateral, stage unspecified: Secondary | ICD-10-CM | POA: Diagnosis not present

## 2019-02-01 DIAGNOSIS — I5031 Acute diastolic (congestive) heart failure: Secondary | ICD-10-CM | POA: Diagnosis not present

## 2019-02-01 NOTE — Progress Notes (Signed)
Location:    [facility] Room Number: 102/P Place of Service:  SNF (31)   CODE STATUS: Full Code  Allergies  Allergen Reactions  . Codeine Itching  . Trileptal [Oxcarbazepine]     Other reaction(s): Other (See Comments) Abnormal sodium levels Hyponatremia   Chief Complaint  Patient presents with  . Medical Management of Chronic Issues         HCAP:  Primary open glaucoma of both eyes unspecified stage: Physical deconditioning:  Acute diastolic CHF (Congestive heart failure)  Weekly follow up for the first 30 days post hospitalization.       HPI:  Curtis [name] is a 73 year old short term rehab patient being seen for the management of his chronic illnesses: glaucoma; physical deconditioning; chf. Curtis [name] is presently being treated for hcap. Curtis [name] states that Curtis [name] is feeling better; is breathing easier; no cough; has more energy.   Past Medical History:  Diagnosis Date  . AAA (abdominal aortic aneurysm), stable 04/20/2014  . Arthritis   . 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 CAD (coronary artery disease)    a. s/p cath in 2015 showing occluded RCA and high-grade LCx stenosis treated with DESx2 to LCx  . Cancer ([place])   . Depression   . Diverticulitis    hospital 2011 [facility]  . Diverticulosis   . GERD (gastroesophageal reflux disease)   . 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 Hernia of unspecified site of abdominal cavity without mention of obstruction or gangrene    hiatal  . Hyperlipidemia LDL goal <70 04/20/2014  . Hypertension   . IBS (irritable bowel syndrome)   . Myocardial infarction ([place]) 01/2014   NSTEMI  . Peripheral vascular disease (HCC)    ankle brachial index of 0.79 on the right and 0.61 on the left.   . 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 Reflex sympathetic dystrophy   . 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 SOB (shortness of breath)     Past Surgical History:  Procedure Laterality Date  . CARDIAC CATHETERIZATION    . CATARACT EXTRACTION    . CORONARY  ANGIOPLASTY    . HERNIA REPAIR    . LEFT HEART CATHETERIZATION WITH CORONARY ANGIOGRAM N/A 01/17/2014   Procedure: LEFT HEART CATHETERIZATION WITH CORONARY ANGIOGRAM;  Surgeon: Leonie Man, MD;  Location: [facility] CATH LAB;  Service: Cardiovascular;  Laterality: N/A;  . left shoulder      x 3  . NISSEN FUNDOPLICATION    . right elbow surgery     x 2  . right knee arthroscopy    . squamous cell skin cancer     Left Hand  . SUPRAVENTRICULAR TACHYCARDIA ABLATION N/A 01/16/2014   Procedure: SUPRAVENTRICULAR TACHYCARDIA ABLATION;  Surgeon: Evans Lance, MD;  Location: [facility] CATH LAB;  Service: Cardiovascular;  Laterality: N/A;    Social History   Socioeconomic History  . Marital status: Widowed    Spouse name: Not on file  . Number of children: 2  . Years of education: 23  . Highest education level: Not on file  Occupational History  . Occupation: [employer]  . Financial resource strain: Not on file  . Food insecurity    Worry: Not on file    Inability: Not on file  . Transportation needs    Medical: Not on file    Non-medical: Not on file  Tobacco Use  . Smoking status: Former Smoker    Packs/day: 2.50  Years: 50.00    Pack years: 125.00    Types: Cigarettes    Quit date: 05/05/2005    Years since quitting: 13.7  . Smokeless tobacco: Former Systems developer    Quit date: 05/03/2012  Substance and Sexual Activity  . Alcohol use: No    Alcohol/week: 0.0 standard drinks    Comment: last use 2013  . Drug use: No  . Sexual activity: Not on file    Comment: widowed, Veteran  Lifestyle  . Physical activity    Days per week: Not on file    Minutes per session: Not on file  . Stress: Not on file  Relationships  . Social Herbalist on phone: Not on file    Gets together: Not on file    Attends religious service: Not on file    Active member of club or organization: Not on file    Attends meetings of clubs or organizations: Not on file    Relationship status: Not  on file  . Intimate partner violence    Fear of current or ex partner: Not on file    Emotionally abused: Not on file    Physically abused: Not on file    Forced sexual activity: Not on file  Other Topics Concern  . Not on file  Social History Narrative   Patient drinks 3 cups of caffeine daily.   Patient is right handed.   Family History  Problem Relation Age of Onset  . Colon cancer Mother   . Colon cancer Maternal Aunt   . Colon cancer Maternal Uncle   . Heart attack Neg Hx   . Stroke Neg Hx       VITAL SIGNS BP 128/64   Pulse (!) 110   Temp 98.4 F (36.9 C) (Oral)   Resp (!) 26   Ht 6' (1.829 m)   Wt 195 lb 6.4 oz (88.6 kg)   SpO2 94%   BMI 26.50 kg/m   Outpatient Encounter Medications as of 02/01/2019  Medication Sig  . acetaminophen (TYLENOL) 325 MG tablet Take 650 mg by mouth every 6 (six) hours as needed.  [name] alprazolam (XANAX) 2 MG tablet Take 2 mg by mouth 2 (two) times daily.  . Amino Acids-Protein Hydrolys (FEEDING SUPPLEMENT, PRO-STAT SUGAR FREE 64,) LIQD Take 30 mLs by mouth daily.  [name] amitriptyline (ELAVIL) 100 MG tablet Take 100 mg by mouth at bedtime.  [name] apixaban (ELIQUIS) 2.5 MG TABS tablet Take 5 mg by mouth 2 (two) times daily.   [name] atorvastatin (LIPITOR) 40 MG tablet Take 40 mg by mouth daily.  Roseanne Kaufman Peru-Castor Oil (VENELEX) OINT Apply 1 application topically See admin instructions. Apply to coccyx, sacrum, bilateral buttocks each shift and prn  . bisacodyl (DULCOLAX) 5 MG EC tablet Take 5 mg by mouth daily as needed for mild constipation or moderate constipation.  . budesonide-formoterol (SYMBICORT) 80-4.5 MCG/ACT inhaler Inhale 2 puffs into the lungs every morning.  . docusate sodium (COLACE) 100 MG capsule Take 100 mg by mouth daily as needed for mild constipation.  . finasteride (PROSCAR) 5 MG tablet Take 5 mg by mouth every morning.   . furosemide (LASIX) 40 MG tablet Take 1 tablet (40 mg total) by mouth daily.  [name] gabapentin (NEURONTIN) 800  MG tablet Take 1 tablet (800 mg total) by mouth 3 (three) times daily.  [name] guaiFENesin (MUCINEX) 600 MG 12 hr tablet Take 600 mg by mouth 2 (two) times daily.   . hydrocortisone  cream 1 % Apply 1 application topically 2 (two) times daily. Apply sparingtly  . ipratropium-albuterol (DUONEB) 0.5-2.5 (3) MG/3ML SOLN Take 3 mLs by nebulization every 4 (four) hours as needed (For wheezing and worsening shortness of breath or respiratory distress).  [name] latanoprost (XALATAN) 0.005 % ophthalmic solution Place 1 drop into both eyes at bedtime.  [name] levofloxacin (LEVAQUIN) 500 MG tablet Take 500 mg by mouth daily.  . metoprolol tartrate (LOPRESSOR) 25 MG tablet Take 25 mg by mouth 2 (two) times daily.   . nitroGLYCERIN (NITROSTAT) 0.4 MG SL tablet Place 1 tablet (0.4 mg total) under the tongue every 5 (five) minutes x 3 doses as needed for chest pain.  . NON FORMULARY Diet: Regular, NAS, Consistent Carbohydrate  . OXYGEN Oxygen @@ 3L/mn via  Special Instructions: Document O2 sat qshift. Every Shift Day, Evening, Night  . potassium chloride (K-DUR) 10 MEQ tablet Take 1 tablet (10 mEq total) by mouth daily.  . Probiotic Product (RISA-BID PROBIOTIC) TABS Take 1 tablet by mouth 2 (two) times daily.  . tamsulosin (FLOMAX) 0.4 MG CAPS capsule Take 0.4 mg by mouth at bedtime.  . [DISCONTINUED] lamoTRIgine (LAMICTAL) 100 MG tablet Take 1 tablet (100 mg total) by mouth 2 (two) times daily.   No facility-administered encounter medications on file as of 02/01/2019.      SIGNIFICANT DIAGNOSTIC EXAMS   PREVIOUS;   01-01-19: chest x-ray:  1. Cardiomegaly with vascular congestion. 2. Small bilateral pleural effusions and bibasilar densities similar to prior radiograph  01-02-19: bilateral lower extremity venous doppler:  1. No evidence of deep venous thrombosis in either lower extremity. 2. Increased pulsatility of the venous waveforms suggests elevated right heart pressures. Findings can be seen in tricuspid  regurgitation, right heart failure, pulmonary hypertension and COPD.  01-02-19: 2-d echo:   1. The left ventricle has normal systolic function, with an ejection fraction of 55-60%. The cavity size was normal. There is mildly increased left ventricular wall thickness. Left ventricular diastolic Doppler parameters are indeterminate.  2. The right ventricle has normal systolic function. The cavity was normal. There is no increase in right ventricular wall thickness. Right ventricular systolic pressure could not be assessed.  3. No evidence of mitral valve stenosis.  4. The aortic valve is tricuspid. Mild thickening of the aortic valve. Mild calcification of the aortic valve. No stenosis of the aortic valve.  5. The aorta is normal unless otherwise noted.  6. The ascending aorta is normal in size and structure.  01-03-19: chest x-ray: Stable cardiomegaly, pulmonary vascular congestion, and bilateral pleural-parenchymal scarring. No acute findings.  TODAY;   01-28-19: chest x-ray: Similar appearance of reticulonodular opacities in the mid and lower lungs, which has progressed slowly from 2016 on chest x-ray exams. While the changes most likely represent chronic interstitial fibrosis/scarring, superimposed infection cannot be excluded radiographically   LABS REVIEWED PREVIOUS;   01-01-19: wbc 8.2; hgb 14.2; hct 51.1; mcv 103.4; plt 216; glucose 100; bun 46; creat 1.88; k+ 5.8; na++ 142; ca 8.7; liver normal albumin 3.1 01-02-19: wbc 5.3; hgb 145; hct 51.8; mcv 101.2; plt 219; glucose 132; bun 45; creat 1.52; k+ 4.7; na++ 143; ca 9.0 ;liver normal albumin 3.3; tsh 0.435 vit B 12; 296; urine culture: staphylococcus species 01-05-19: glucose 129; bun 37; creat 0.98; k+ 4.1; na++ 134; ca 8.5; mag 1.9 01-10-19: glucose 111; bun 38; creat 0.85; k+ 3.5; na++ 130; ca 8.7   TODAY;   01-28-19: wbc 8.6; hgb 11.0 hct 36.1; mcv 93.0  plt 277; glucose 125; bun 20; creat 1.08; k+ 4.5; na++ 134; ca 8.9 blood and urine  cultures: no growth.  Review of Systems  Constitutional: Negative for malaise/fatigue.  Respiratory: Negative for cough and shortness of breath.   Cardiovascular: Negative for chest pain, palpitations and leg swelling.  Gastrointestinal: Negative for abdominal pain, constipation and heartburn.  Musculoskeletal: Negative for back pain, joint pain and myalgias.  Skin: Negative.   Neurological: Negative for dizziness.  Psychiatric/Behavioral: The patient is not nervous/anxious.       Physical Exam Constitutional:      General: Curtis [name] is not in acute distress.    Appearance: Curtis [name] is well-developed. Curtis [name] is not diaphoretic.  Eyes:     Comments: History of cataract extraction       Neck:     Musculoskeletal: Neck supple.     Thyroid: No thyromegaly.  Cardiovascular:     Rate and Rhythm: Normal rate and regular rhythm.     Pulses: Normal pulses.     Heart sounds: Normal heart sounds.     Comments: History of SVT ablation; ptca  Pulmonary:     Effort: Pulmonary effort is normal. No respiratory distress.     Comments: 02 dependent Lung sounds diminished  Abdominal:     General: Bowel sounds are normal. There is no distension.     Palpations: Abdomen is soft.     Tenderness: There is no abdominal tenderness.  Genitourinary:    Comments: Foley  Musculoskeletal: Normal range of motion.     Right lower leg: No edema.     Left lower leg: No edema.  Lymphadenopathy:     Cervical: No cervical adenopathy.  Skin:    General: Skin is warm and dry.     Comments: Left heel: DTI: 4.3 x 6.4 cm Right heel: stage II: 1.5 x 1.8 cm Without signs of infection present.   Neurological:     Mental Status: Curtis [name] is alert and oriented to person, place, and time.  Psychiatric:        Mood and Affect: Mood normal.      ASSESSMENT/ PLAN:  TODAY;   1. HCAP: is stable will complete abt and will continue to monitor his status.   2. Primary open glaucoma of both eyes unspecified stage: is stable will  continue xalatan to both eyes  3. Physical deconditioning: status post multiple falls: is stable will continue therapy as directed to improve upon his level of independence with his adls.   4. Acute diastolic CHF (Congestive heart failure) EF 55-60% (12-06-18) will continue lasix 40 mg daily lopressor 25 mg twice daily will monitor   PREVIOUS   5. Hypokalemia: is stable k+ 4.5  will continue k+ 10 meq daily   6.  Generalized anxiety disorder: is stable will continue xanax 2 mg twice daily   7. Acute on chronic respiratory failure with hypoxia and hyyercapnia/ COPD with chronic bronchitis asbestosis: is stable is 02 dependent will continue albuterol 2 puffs every 6 hours as needed; symbicort 80/4.5 mcg 2 puffs twice daily mucinex 600 mg  twice daily    8. Atrial flutter/ SVT (suprventricular tachycardia) s/p ablation: heart rate is stable will continue eliquis 5 mg twice  daily lopressor 25 mg twice daily for rate control.   9. Right heel stage 2 ulceration; left heel DTI: is stable will continue treatment as directed and will monitor   10. Peripheral vascular disease/heredity and idiopathic peripheral neuropathy: is stable will continue elavil 100 mg nightly  and neurontin 800 mg three times daily   11. Urinary retention: is stable has foley has failed voiding trail twice daily; awaiting urology consult will continue proscar 5 mg and flomax 0.4 mg daily   12. Hyperlipidemia: LDL goal <70; is stable will continue lipitor 40 mg daily      MD is aware of resident's narcotic use and is in agreement with current plan of care. We will attempt to wean resident as appropriate.  Ok Edwards NP [facility]  Contact [phone] Monday through Friday 8am- 5pm  After hours call [phone]

## 2019-02-02 LAB — CULTURE, BLOOD (ROUTINE X 2)
Culture: NO GROWTH
Culture: NO GROWTH
Special Requests: ADEQUATE
Special Requests: ADEQUATE

## 2019-02-04 ENCOUNTER — Non-Acute Institutional Stay (SKILLED_NURSING_FACILITY): Payer: Medicare Other | Admitting: Adult Health

## 2019-02-04 ENCOUNTER — Encounter: Payer: Self-pay | Admitting: Adult Health

## 2019-02-04 ENCOUNTER — Encounter (HOSPITAL_COMMUNITY)
Admission: AD | Admit: 2019-02-04 | Discharge: 2019-02-04 | Disposition: A | Discharge status: Home / Self Care | Payer: Medicare Other | Source: Skilled Nursing Facility | Attending: Adult Health | Admitting: Adult Health

## 2019-02-04 ENCOUNTER — Ambulatory Visit (HOSPITAL_COMMUNITY)
Admission: RE | Admit: 2019-02-04 | Discharge: 2019-02-04 | Disposition: A | Discharge status: Home / Self Care | Payer: Medicare Other | Source: Ambulatory Visit | Attending: Internal Medicine | Admitting: Internal Medicine

## 2019-02-04 DIAGNOSIS — J441 Chronic obstructive pulmonary disease with (acute) exacerbation: Secondary | ICD-10-CM | POA: Insufficient documentation

## 2019-02-04 DIAGNOSIS — J9622 Acute and chronic respiratory failure with hypercapnia: Secondary | ICD-10-CM

## 2019-02-04 DIAGNOSIS — J189 Pneumonia, unspecified organism: Secondary | ICD-10-CM | POA: Diagnosis not present

## 2019-02-04 DIAGNOSIS — R5381 Other malaise: Secondary | ICD-10-CM | POA: Insufficient documentation

## 2019-02-04 DIAGNOSIS — J449 Chronic obstructive pulmonary disease, unspecified: Secondary | ICD-10-CM

## 2019-02-04 DIAGNOSIS — J9621 Acute and chronic respiratory failure with hypoxia: Secondary | ICD-10-CM | POA: Diagnosis not present

## 2019-02-04 LAB — CBC WITH DIFFERENTIAL/PLATELET
Abs Immature Granulocytes: 1.5 10*3/uL — ABNORMAL HIGH (ref 0.00–0.07)
Basophils Absolute: 0.2 10*3/uL — ABNORMAL HIGH (ref 0.0–0.1)
Basophils Relative: 1 %
Eosinophils Absolute: 0 10*3/uL (ref 0.0–0.5)
Eosinophils Relative: 0 %
HCT: 36.3 % — ABNORMAL LOW (ref 39.0–52.0)
Hemoglobin: 11 g/dL — ABNORMAL LOW (ref 13.0–17.0)
Immature Granulocytes: 8 %
Lymphocytes Relative: 4 %
Lymphs Abs: 0.8 10*3/uL (ref 0.7–4.0)
MCH: 28 pg (ref 26.0–34.0)
MCHC: 30.3 g/dL (ref 30.0–36.0)
MCV: 92.4 fL (ref 80.0–100.0)
Monocytes Absolute: 1.4 10*3/uL — ABNORMAL HIGH (ref 0.1–1.0)
Monocytes Relative: 8 %
Neutro Abs: 14.1 10*3/uL — ABNORMAL HIGH (ref 1.7–7.7)
Neutrophils Relative %: 79 %
Platelets: 545 10*3/uL — ABNORMAL HIGH (ref 150–400)
RBC: 3.93 MIL/uL — ABNORMAL LOW (ref 4.22–5.81)
RDW: 17.4 % — ABNORMAL HIGH (ref 11.5–15.5)
WBC: 18 10*3/uL — ABNORMAL HIGH (ref 4.0–10.5)
nRBC: 0 % (ref 0.0–0.2)

## 2019-02-04 NOTE — Progress Notes (Signed)
Location:    [facility] Room Number: 102/P Place of Service:  SNF (31)   CODE STATUS: Full Code  Allergies  Allergen Reactions  . Codeine Itching  . Trileptal [Oxcarbazepine]     Other reaction(s): Other (See Comments) Abnormal sodium levels Hyponatremia    Chief Complaint  Patient presents with  . Acute Visit    Fever    HPI:  He has ran fevers over the past night with a t max of 100.9.  He has less endurance to activities; he has more shortness of breath and more fatigue. His appetite is poor. He denies any cough.    Past Medical History:  Diagnosis Date  . AAA (abdominal aortic aneurysm), stable 04/20/2014  . Arthritis   . 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 CAD (coronary artery disease)    a. s/p cath in 2015 showing occluded RCA and high-grade LCx stenosis treated with DESx2 to LCx  . Cancer ([place])   . Depression   . Diverticulitis    hospital 2011 [facility]  . Diverticulosis   . GERD (gastroesophageal reflux disease)   . 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 Hernia of unspecified site of abdominal cavity without mention of obstruction or gangrene    hiatal  . Hyperlipidemia LDL goal <70 04/20/2014  . Hypertension   . IBS (irritable bowel syndrome)   . Myocardial infarction ([place]) 01/2014   NSTEMI  . Peripheral vascular disease (H[facility])    ankle brachial index of 0.79 on the right and 0.61 on the left.   . 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 Reflex sympathetic dystrophy   . 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 SOB (shortness of breath)     Past Surgical History:  Procedure Laterality Date  . CARDIAC CATHETERIZATION    . CATARACT EXTRACTION    . CORONARY ANGIOPLASTY    . HERNIA REPAIR    . LEFT HEART CATHETERIZATION WITH CORONARY ANGIOGRAM N/A 01/17/2014   Procedure: LEFT HEART CATHETERIZATION WITH CORONARY ANGIOGRAM;  Surgeon: Leonie Man, MD;  Location: [facility] CATH LAB;  Service: Cardiovascular;  Laterality: N/A;  . left shoulder      x 3  .  NISSEN FUNDOPLICATION    . right elbow surgery     x 2  . right knee arthroscopy    . squamous cell skin cancer     Left Hand  . SUPRAVENTRICULAR TACHYCARDIA ABLATION N/A 01/16/2014   Procedure: SUPRAVENTRICULAR TACHYCARDIA ABLATION;  Surgeon: Evans Lance, MD;  Location: [facility] CATH LAB;  Service: Cardiovascular;  Laterality: N/A;    Social History   Socioeconomic History  . Marital status: Widowed    Spouse name: Not on file  . Number of children: 2  . Years of education: 68  . Highest education level: Not on file  Occupational History  . Occupation: [employer]  . Financial resource strain: Not on file  . Food insecurity    Worry: Not on file    Inability: Not on file  . Transportation needs    Medical: Not on file    Non-medical: Not on file  Tobacco Use  . Smoking status: Former Smoker    Packs/day: 2.50    Years: 50.00    Pack years: 125.00    Types: Cigarettes    Quit date: 05/05/2005    Years since quitting: 13.7  . Smokeless tobacco: Former Systems developer    Quit date: 05/03/2012  Substance and Sexual Activity  .  Alcohol use: No    Alcohol/week: 0.0 standard drinks    Comment: last use 2013  . Drug use: No  . Sexual activity: Not on file    Comment: widowed, Veteran  Lifestyle  . Physical activity    Days per week: Not on file    Minutes per session: Not on file  . Stress: Not on file  Relationships  . Social Herbalist on phone: Not on file    Gets together: Not on file    Attends religious service: Not on file    Active member of club or organization: Not on file    Attends meetings of clubs or organizations: Not on file    Relationship status: Not on file  . Intimate partner violence    Fear of current or ex partner: Not on file    Emotionally abused: Not on file    Physically abused: Not on file    Forced sexual activity: Not on file  Other Topics Concern  . Not on file  Social History Narrative   Patient drinks 3 cups of caffeine  daily.   Patient is right handed.   Family History  Problem Relation Age of Onset  . Colon cancer Mother   . Colon cancer Maternal Aunt   . Colon cancer Maternal Uncle   . Heart attack Neg Hx   . Stroke Neg Hx       VITAL SIGNS BP (!) 114/59   Pulse 73   Temp 98.5 F (36.9 C) (Oral)   Resp (!) 26   Ht 6' (1.829 m)   Wt 196 lb 3.2 oz (89 kg)   SpO2 90%   BMI 26.61 kg/m   Outpatient Encounter Medications as of 02/04/2019  Medication Sig  . acetaminophen (TYLENOL) 325 MG tablet Take 650 mg by mouth every 6 (six) hours as needed.  [name] alprazolam (XANAX) 2 MG tablet Take 2 mg by mouth 2 (two) times daily.  . Amino Acids-Protein Hydrolys (FEEDING SUPPLEMENT, PRO-STAT SUGAR FREE 64,) LIQD Take 30 mLs by mouth 2 (two) times daily between meals.   [name] amitriptyline (ELAVIL) 100 MG tablet Take 100 mg by mouth at bedtime.  [name] apixaban (ELIQUIS) 2.5 MG TABS tablet Take 5 mg by mouth 2 (two) times daily.   [name] atorvastatin (LIPITOR) 40 MG tablet Take 40 mg by mouth daily.  Roseanne Kaufman [place]-Castor Oil (VENELEX) OINT Apply 1 application topically See admin instructions. Apply to coccyx, sacrum, bilateral buttocks each shift and prn  . bisacodyl (DULCOLAX) 5 MG EC tablet Take 5 mg by mouth daily as needed for mild constipation or moderate constipation.  . budesonide-formoterol (SYMBICORT) 80-4.5 MCG/ACT inhaler Inhale 2 puffs into the lungs every morning.  . docusate sodium (COLACE) 100 MG capsule Take 100 mg by mouth daily as needed for mild constipation.  . finasteride (PROSCAR) 5 MG tablet Take 5 mg by mouth every morning.   . furosemide (LASIX) 40 MG tablet Take 1 tablet (40 mg total) by mouth daily.  [name] gabapentin (NEURONTIN) 800 MG tablet Take 1 tablet (800 mg total) by mouth 3 (three) times daily.  [name] guaiFENesin (MUCINEX) 600 MG 12 hr tablet Take 600 mg by mouth 2 (two) times daily.   . hydrocortisone cream 1 % Apply 1 application topically 2 (two) times daily as needed. Apply sparingtly   .  ipratropium-albuterol (DUONEB) 0.5-2.5 (3) MG/3ML SOLN Take 3 mLs by nebulization every 4 (four) hours as needed (For wheezing and worsening  shortness of breath or respiratory distress).  [name] latanoprost (XALATAN) 0.005 % ophthalmic solution Place 1 drop into both eyes at bedtime.  . metoprolol tartrate (LOPRESSOR) 25 MG tablet Take 25 mg by mouth 2 (two) times daily.   . Multiple Vitamins-Minerals (MULTIVITAMIN WITH MINERALS) tablet Take 1 tablet by mouth daily.  . nitroGLYCERIN (NITROSTAT) 0.4 MG SL tablet Place 1 tablet (0.4 mg total) under the tongue every 5 (five) minutes x 3 doses as needed for chest pain.  . NON FORMULARY Diet: Regular, NAS, Consistent Carbohydrate  . ondansetron (ZOFRAN) 4 MG tablet Take 4 mg by mouth 4 (four) times daily as needed for nausea or vomiting.  . OXYGEN Oxygen @@ 3L/mn via [place] Special Instructions: Document O2 sat qshift. Every Shift Day, Evening, Night  . potassium chloride (K-DUR) 10 MEQ tablet Take 1 tablet (10 mEq total) by mouth daily.  . tamsulosin (FLOMAX) 0.4 MG CAPS capsule Take 0.4 mg by mouth at bedtime.  . [DISCONTINUED] lamoTRIgine (LAMICTAL) 100 MG tablet Take 1 tablet (100 mg total) by mouth 2 (two) times daily.  . [DISCONTINUED] levofloxacin (LEVAQUIN) 500 MG tablet Take 500 mg by mouth daily.  . [DISCONTINUED] Probiotic Product (RISA-BID PROBIOTIC) TABS Take 1 tablet by mouth 2 (two) times daily.   No facility-administered encounter medications on file as of 02/04/2019.      SIGNIFICANT DIAGNOSTIC EXAMS   PREVIOUS;   01-01-19: chest x-ray:  1. Cardiomegaly with vascular congestion. 2. Small bilateral pleural effusions and bibasilar densities similar to prior radiograph  01-02-19: bilateral lower extremity venous doppler:  1. No evidence of deep venous thrombosis in either lower extremity. 2. Increased pulsatility of the venous waveforms suggests elevated right heart pressures. Findings can be seen in tricuspid regurgitation, right heart  failure, pulmonary hypertension and COPD.  01-02-19: 2-d echo:   1. The left ventricle has normal systolic function, with an ejection fraction of 55-60%. The cavity size was normal. There is mildly increased left ventricular wall thickness. Left ventricular diastolic Doppler parameters are indeterminate.  2. The right ventricle has normal systolic function. The cavity was normal. There is no increase in right ventricular wall thickness. Right ventricular systolic pressure could not be assessed.  3. No evidence of mitral valve stenosis.  4. The aortic valve is tricuspid. Mild thickening of the aortic valve. Mild calcification of the aortic valve. No stenosis of the aortic valve.  5. The aorta is normal unless otherwise noted.  6. The ascending aorta is normal in size and structure.  01-03-19: chest x-ray: Stable cardiomegaly, pulmonary vascular congestion, and bilateral pleural-parenchymal scarring. No acute findings.  01-28-19: chest x-ray: Similar appearance of reticulonodular opacities in the mid and lower lungs, which has progressed slowly from 2016 on chest x-ray exams. While the changes most likely represent chronic interstitial fibrosis/scarring, superimposed infection cannot be excluded radiographically   NO NEW EXAMS.   LABS REVIEWED PREVIOUS;   01-01-19: wbc 8.2; hgb 14.2; hct 51.1; mcv 103.4; plt 216; glucose 100; bun 46; creat 1.88; k+ 5.8; na++ 142; ca 8.7; liver normal albumin 3.1 01-02-19: wbc 5.3; hgb 145; hct 51.8; mcv 101.2; plt 219; glucose 132; bun 45; creat 1.52; k+ 4.7; na++ 143; ca 9.0 ;liver normal albumin 3.3; tsh 0.435 vit B 12; 296; urine culture: staphylococcus species 01-05-19: glucose 129; bun 37; creat 0.98; k+ 4.1; na++ 134; ca 8.5; mag 1.9 01-10-19: glucose 111; bun 38; creat 0.85; k+ 3.5; na++ 130; ca 8.7  01-28-19: wbc 8.6; hgb 11.0 hct 36.1; mcv 93.0  plt 277; glucose 125; bun 20; creat 1.08; k+ 4.5; na++ 134; ca 8.9 blood and urine cultures: no growth.  TODAY;'   02-04-19: wbc 18.0; hgb 11.0; hct 36.3; mcv 92.4; plt 545   Review of Systems  Constitutional: Positive for malaise/fatigue.  Respiratory: Positive for shortness of breath. Negative for cough.   Cardiovascular: Negative for chest pain, palpitations and leg swelling.  Gastrointestinal: Negative for abdominal pain, constipation and heartburn.  Musculoskeletal: Negative for back pain, joint pain and myalgias.  Skin: Negative.   Neurological: Negative for dizziness.  Psychiatric/Behavioral: The patient is not nervous/anxious.       Physical Exam Constitutional:      General: He is not in acute distress.    Appearance: He is well-developed. He is not diaphoretic.  Eyes:     Comments: History of cataract extraction  Neck:     Musculoskeletal: Neck supple.     Thyroid: No thyromegaly.  Cardiovascular:     Rate and Rhythm: Normal rate and regular rhythm.     Pulses: Normal pulses.     Heart sounds: Normal heart sounds.     Comments: History of SVT ablation; ptca  Pulmonary:     Effort: Pulmonary effort is normal. No respiratory distress.     Breath sounds: Normal breath sounds.     Comments: 02 dependent Lung sounds significantly  diminished  Abdominal:     General: Bowel sounds are normal. There is no distension.     Palpations: Abdomen is soft.     Tenderness: There is no abdominal tenderness.  Genitourinary:    Comments: Foley  Musculoskeletal: Normal range of motion.  Lymphadenopathy:     Cervical: No cervical adenopathy.  Skin:    General: Skin is warm and dry.     Comments: Left heel: DTI: 4.3 x 6.4 cm Right heel: stage II: 1.5 x 1.8 cm Without signs of infection present.   Neurological:     Mental Status: He is alert and oriented to person, place, and time.  Psychiatric:        Mood and Affect: Mood normal.     ASSESSMENT/ PLAN:  TODAY;   1. HCAP (health care associated pneumonia) 2. COPD with bronchitis 3. Acute on chronic respiratory failure with hypoxia  and hypercapnia  CBC done Will get stat chest x-ray Will insert picc line Will increase symbicort to 180/4.5 mcg 2 puffs twice daily  Will begin zoysn 4.5 gm every 6 hours through 02-14-19.  Will monitor his status.      MD is aware of resident's narcotic use and is in agreement with current plan of care. We will attempt to wean resident as appropriate.  Ok Edwards NP [facility]  Contact [phone] Monday through Friday 8am- 5pm  After hours call [phone]

## 2019-02-07 ENCOUNTER — Ambulatory Visit: Payer: Medicare Other | Admitting: Physician Assistant

## 2019-02-08 ENCOUNTER — Encounter (HOSPITAL_COMMUNITY): Payer: Self-pay | Admitting: Emergency Medicine

## 2019-02-08 ENCOUNTER — Other Ambulatory Visit (HOSPITAL_COMMUNITY)
Admission: RE | Admit: 2019-02-08 | Discharge: 2019-02-08 | Disposition: A | Discharge status: Home / Self Care | Payer: Medicare Other | Source: Skilled Nursing Facility | Attending: Adult Health | Admitting: Adult Health

## 2019-02-08 ENCOUNTER — Emergency Department (HOSPITAL_COMMUNITY): Payer: Medicare Other

## 2019-02-08 ENCOUNTER — Encounter: Payer: Self-pay | Admitting: Adult Health

## 2019-02-08 ENCOUNTER — Non-Acute Institutional Stay (SKILLED_NURSING_FACILITY): Payer: Medicare Other | Admitting: Adult Health

## 2019-02-08 ENCOUNTER — Other Ambulatory Visit: Payer: Self-pay

## 2019-02-08 ENCOUNTER — Inpatient Hospital Stay (HOSPITAL_COMMUNITY)
Admission: EM | Admit: 2019-02-08 | Discharge: 2019-02-14 | DRG: 091 | Disposition: A | Discharge status: Skilled Nursing Facility | Payer: Medicare Other | Attending: Internal Medicine | Admitting: Internal Medicine

## 2019-02-08 DIAGNOSIS — I10 Essential (primary) hypertension: Secondary | ICD-10-CM | POA: Diagnosis present

## 2019-02-08 DIAGNOSIS — J449 Chronic obstructive pulmonary disease, unspecified: Secondary | ICD-10-CM | POA: Diagnosis not present

## 2019-02-08 DIAGNOSIS — I5031 Acute diastolic (congestive) heart failure: Secondary | ICD-10-CM | POA: Diagnosis not present

## 2019-02-08 DIAGNOSIS — L89320 Pressure ulcer of left buttock, unstageable: Secondary | ICD-10-CM | POA: Diagnosis present

## 2019-02-08 DIAGNOSIS — E669 Obesity, unspecified: Secondary | ICD-10-CM | POA: Diagnosis present

## 2019-02-08 DIAGNOSIS — Z9861 Coronary angioplasty status: Secondary | ICD-10-CM

## 2019-02-08 DIAGNOSIS — T50905A Adverse effect of unspecified drugs, medicaments and biological substances, initial encounter: Secondary | ICD-10-CM | POA: Diagnosis present

## 2019-02-08 DIAGNOSIS — Z888 Allergy status to other drugs, medicaments and biological substances status: Secondary | ICD-10-CM | POA: Diagnosis not present

## 2019-02-08 DIAGNOSIS — R112 Nausea with vomiting, unspecified: Secondary | ICD-10-CM | POA: Diagnosis not present

## 2019-02-08 DIAGNOSIS — I252 Old myocardial infarction: Secondary | ICD-10-CM

## 2019-02-08 DIAGNOSIS — K589 Irritable bowel syndrome without diarrhea: Secondary | ICD-10-CM | POA: Diagnosis present

## 2019-02-08 DIAGNOSIS — J189 Pneumonia, unspecified organism: Secondary | ICD-10-CM | POA: Diagnosis present

## 2019-02-08 DIAGNOSIS — I471 Supraventricular tachycardia: Secondary | ICD-10-CM | POA: Diagnosis present

## 2019-02-08 DIAGNOSIS — B179 Acute viral hepatitis, unspecified: Secondary | ICD-10-CM | POA: Diagnosis present

## 2019-02-08 DIAGNOSIS — K828 Other specified diseases of gallbladder: Secondary | ICD-10-CM | POA: Diagnosis present

## 2019-02-08 DIAGNOSIS — Z800 Family history of malignant neoplasm of digestive organs: Secondary | ICD-10-CM

## 2019-02-08 DIAGNOSIS — J440 Chronic obstructive pulmonary disease with acute lower respiratory infection: Secondary | ICD-10-CM | POA: Diagnosis present

## 2019-02-08 DIAGNOSIS — N179 Acute kidney failure, unspecified: Secondary | ICD-10-CM | POA: Diagnosis present

## 2019-02-08 DIAGNOSIS — Y95 Nosocomial condition: Secondary | ICD-10-CM | POA: Diagnosis present

## 2019-02-08 DIAGNOSIS — M79604 Pain in right leg: Secondary | ICD-10-CM | POA: Diagnosis present

## 2019-02-08 DIAGNOSIS — L89154 Pressure ulcer of sacral region, stage 4: Secondary | ICD-10-CM

## 2019-02-08 DIAGNOSIS — R7989 Other specified abnormal findings of blood chemistry: Secondary | ICD-10-CM | POA: Diagnosis not present

## 2019-02-08 DIAGNOSIS — E785 Hyperlipidemia, unspecified: Secondary | ICD-10-CM | POA: Diagnosis present

## 2019-02-08 DIAGNOSIS — F329 Major depressive disorder, single episode, unspecified: Secondary | ICD-10-CM | POA: Diagnosis present

## 2019-02-08 DIAGNOSIS — R339 Retention of urine, unspecified: Secondary | ICD-10-CM | POA: Diagnosis not present

## 2019-02-08 DIAGNOSIS — G9050 Complex regional pain syndrome I, unspecified: Secondary | ICD-10-CM | POA: Diagnosis present

## 2019-02-08 DIAGNOSIS — E1151 Type 2 diabetes mellitus with diabetic peripheral angiopathy without gangrene: Secondary | ICD-10-CM | POA: Diagnosis present

## 2019-02-08 DIAGNOSIS — L89612 Pressure ulcer of right heel, stage 2: Secondary | ICD-10-CM | POA: Diagnosis present

## 2019-02-08 DIAGNOSIS — F410 Panic disorder [episodic paroxysmal anxiety] without agoraphobia: Secondary | ICD-10-CM | POA: Diagnosis present

## 2019-02-08 DIAGNOSIS — E860 Dehydration: Secondary | ICD-10-CM | POA: Diagnosis present

## 2019-02-08 DIAGNOSIS — I4892 Unspecified atrial flutter: Secondary | ICD-10-CM | POA: Diagnosis present

## 2019-02-08 DIAGNOSIS — I2510 Atherosclerotic heart disease of native coronary artery without angina pectoris: Secondary | ICD-10-CM | POA: Diagnosis present

## 2019-02-08 DIAGNOSIS — F17210 Nicotine dependence, cigarettes, uncomplicated: Secondary | ICD-10-CM | POA: Diagnosis present

## 2019-02-08 DIAGNOSIS — Z8673 Personal history of transient ischemic attack (TIA), and cerebral infarction without residual deficits: Secondary | ICD-10-CM

## 2019-02-08 DIAGNOSIS — L89152 Pressure ulcer of sacral region, stage 2: Secondary | ICD-10-CM | POA: Diagnosis present

## 2019-02-08 DIAGNOSIS — Z7901 Long term (current) use of anticoagulants: Secondary | ICD-10-CM

## 2019-02-08 DIAGNOSIS — R590 Localized enlarged lymph nodes: Secondary | ICD-10-CM | POA: Diagnosis present

## 2019-02-08 DIAGNOSIS — K219 Gastro-esophageal reflux disease without esophagitis: Secondary | ICD-10-CM | POA: Diagnosis present

## 2019-02-08 DIAGNOSIS — G92 Toxic encephalopathy: Principal | ICD-10-CM | POA: Diagnosis present

## 2019-02-08 DIAGNOSIS — Z20828 Contact with and (suspected) exposure to other viral communicable diseases: Secondary | ICD-10-CM | POA: Diagnosis present

## 2019-02-08 DIAGNOSIS — H409 Unspecified glaucoma: Secondary | ICD-10-CM | POA: Diagnosis present

## 2019-02-08 DIAGNOSIS — Z7951 Long term (current) use of inhaled steroids: Secondary | ICD-10-CM

## 2019-02-08 DIAGNOSIS — M79605 Pain in left leg: Secondary | ICD-10-CM | POA: Diagnosis present

## 2019-02-08 DIAGNOSIS — R7401 Elevation of levels of liver transaminase levels: Secondary | ICD-10-CM | POA: Diagnosis present

## 2019-02-08 DIAGNOSIS — Z79899 Other long term (current) drug therapy: Secondary | ICD-10-CM

## 2019-02-08 DIAGNOSIS — Z77090 Contact with and (suspected) exposure to asbestos: Secondary | ICD-10-CM | POA: Diagnosis present

## 2019-02-08 DIAGNOSIS — Z85828 Personal history of other malignant neoplasm of skin: Secondary | ICD-10-CM

## 2019-02-08 DIAGNOSIS — M1990 Unspecified osteoarthritis, unspecified site: Secondary | ICD-10-CM | POA: Diagnosis present

## 2019-02-08 DIAGNOSIS — Z885 Allergy status to narcotic agent status: Secondary | ICD-10-CM

## 2019-02-08 DIAGNOSIS — I714 Abdominal aortic aneurysm, without rupture: Secondary | ICD-10-CM | POA: Diagnosis present

## 2019-02-08 DIAGNOSIS — J61 Pneumoconiosis due to asbestos and other mineral fibers: Secondary | ICD-10-CM | POA: Diagnosis present

## 2019-02-08 DIAGNOSIS — R748 Abnormal levels of other serum enzymes: Secondary | ICD-10-CM | POA: Diagnosis not present

## 2019-02-08 DIAGNOSIS — Z6830 Body mass index (BMI) 30.0-30.9, adult: Secondary | ICD-10-CM

## 2019-02-08 DIAGNOSIS — I110 Hypertensive heart disease with heart failure: Secondary | ICD-10-CM | POA: Diagnosis present

## 2019-02-08 DIAGNOSIS — R197 Diarrhea, unspecified: Secondary | ICD-10-CM

## 2019-02-08 HISTORY — DX: Pneumonia, unspecified organism: J18.9

## 2019-02-08 LAB — COMPREHENSIVE METABOLIC PANEL
ALT: 846 U/L — ABNORMAL HIGH (ref 0–44)
AST: 1485 U/L — ABNORMAL HIGH (ref 15–41)
Albumin: 2.1 g/dL — ABNORMAL LOW (ref 3.5–5.0)
Alkaline Phosphatase: 88 U/L (ref 38–126)
Anion gap: 12 (ref 5–15)
BUN: 36 mg/dL — ABNORMAL HIGH (ref 8–23)
CO2: 27 mmol/L (ref 22–32)
Calcium: 8.7 mg/dL — ABNORMAL LOW (ref 8.9–10.3)
Chloride: 92 mmol/L — ABNORMAL LOW (ref 98–111)
Creatinine, Ser: 2.31 mg/dL — ABNORMAL HIGH (ref 0.61–1.24)
GFR calc Af Amer: 31 mL/min — ABNORMAL LOW (ref >60)
GFR calc non Af Amer: 27 mL/min — ABNORMAL LOW (ref >60)
Glucose, Bld: 201 mg/dL — ABNORMAL HIGH (ref 70–99)
Potassium: 4.8 mmol/L (ref 3.5–5.1)
Sodium: 131 mmol/L — ABNORMAL LOW (ref 135–145)
Total Bilirubin: 0.5 mg/dL (ref 0.3–1.2)
Total Protein: 6.3 g/dL — ABNORMAL LOW (ref 6.5–8.1)

## 2019-02-08 LAB — C DIFFICILE QUICK SCREEN W PCR REFLEX
C Diff antigen: NEGATIVE
C Diff interpretation: NOT DETECTED
C Diff toxin: NEGATIVE

## 2019-02-08 LAB — CBC
HCT: 34 % — ABNORMAL LOW (ref 39.0–52.0)
Hemoglobin: 10.4 g/dL — ABNORMAL LOW (ref 13.0–17.0)
MCH: 28.7 pg (ref 26.0–34.0)
MCHC: 30.6 g/dL (ref 30.0–36.0)
MCV: 93.7 fL (ref 80.0–100.0)
Platelets: 478 10*3/uL — ABNORMAL HIGH (ref 150–400)
RBC: 3.63 MIL/uL — ABNORMAL LOW (ref 4.22–5.81)
RDW: 18.4 % — ABNORMAL HIGH (ref 11.5–15.5)
WBC: 27.4 10*3/uL — ABNORMAL HIGH (ref 4.0–10.5)
nRBC: 0.2 % (ref 0.0–0.2)

## 2019-02-08 LAB — HEPATITIS PANEL, ACUTE
HCV Ab: NONREACTIVE
Hep A IgM: NONREACTIVE
Hep B C IgM: NONREACTIVE
Hepatitis B Surface Ag: NONREACTIVE

## 2019-02-08 LAB — BILIRUBIN, DIRECT: Bilirubin, Direct: 0.1 mg/dL (ref 0.0–0.2)

## 2019-02-08 LAB — AMYLASE: Amylase: 16 U/L — ABNORMAL LOW (ref 28–100)

## 2019-02-08 LAB — D-DIMER, QUANTITATIVE: D-Dimer, Quant: 4.15 ug/mL-FEU — ABNORMAL HIGH (ref 0.00–0.50)

## 2019-02-08 LAB — FERRITIN: Ferritin: 1701 ng/mL — ABNORMAL HIGH (ref 24–336)

## 2019-02-08 LAB — LACTIC ACID, PLASMA: Lactic Acid, Venous: 2.5 mmol/L (ref 0.5–1.9)

## 2019-02-08 LAB — LIPASE, BLOOD: Lipase: 22 U/L (ref 11–51)

## 2019-02-08 MED ORDER — METRONIDAZOLE IN NACL 5-0.79 MG/ML-% IV SOLN
500.0000 mg | Freq: Once | INTRAVENOUS | Status: AC
  Administered 2019-02-08: 500 mg via INTRAVENOUS
  Filled 2019-02-08: qty 100

## 2019-02-08 MED ORDER — SODIUM CHLORIDE 0.9 % IV SOLN
1.0000 g | Freq: Once | INTRAVENOUS | Status: AC
  Administered 2019-02-08: 1 g via INTRAVENOUS
  Filled 2019-02-08 (×2): qty 1

## 2019-02-08 NOTE — ED Notes (Signed)
CRITICAL VALUE ALERT  Critical Value: Lactic Acid 2.5  Date & Time Notied: 02/08/19 2224  Provider Notified: Dr. Sabra Heck  Orders Received/Actions taken: n/a

## 2019-02-08 NOTE — ED Triage Notes (Signed)
Pt sent from [facility] for elevated D-Dimer and possible COVID. Per staff at [facility] pt was tested today and had negative result. Pt denies SOB, chest pain, and back pain.

## 2019-02-08 NOTE — Progress Notes (Signed)
Location:    [place] Room Number: 102/P Place of Service:  SNF (31)   CODE STATUS: Full Code  Allergies  Allergen Reactions  . Codeine Itching  . Trileptal [Oxcarbazepine]     Other reaction(s): Other (See Comments) Abnormal sodium levels Hyponatremia    Chief Complaint  Patient presents with  . Acute Visit    Change in status     HPI:  He has had a decline in his status. Staff reports that his wounds have gotten worse and has developed a wound on his left buttock. He has had n/v/d. He is more lethargic; his po intake is very poor.  He had a rapid covid which as negative. His liver enzymes are significantly elevated; his d-dimer is elevated and wbc is more elevated    There are no reports of further fevers.   Past Medical History:  Diagnosis Date  . AAA (abdominal aortic aneurysm), stable 04/20/2014  . Arthritis   . 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 CAD (coronary artery disease)    a. s/p cath in 2015 showing occluded RCA and high-grade LCx stenosis treated with DESx2 to LCx  . Cancer ([place])   . Depression   . Diverticulitis    hospital 2011 [facility]  . Diverticulosis   . GERD (gastroesophageal reflux disease)   . 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 Hernia of unspecified site of abdominal cavity without mention of obstruction or gangrene    hiatal  . Hyperlipidemia LDL goal <70 04/20/2014  . Hypertension   . IBS (irritable bowel syndrome)   . Myocardial infarction ([place]) 01/2014   NSTEMI  . Peripheral vascular disease ([facility])    ankle brachial index of 0.79 on the right and 0.61 on the left.   . 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 Reflex sympathetic dystrophy   . 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 SOB (shortness of breath)     Past Surgical History:  Procedure Laterality Date  . CARDIAC CATHETERIZATION    . CATARACT EXTRACTION    . CORONARY ANGIOPLASTY    . HERNIA REPAIR    . LEFT HEART CATHETERIZATION WITH CORONARY ANGIOGRAM N/A 01/17/2014   Procedure: LEFT HEART CATHETERIZATION WITH CORONARY ANGIOGRAM;  Surgeon: Leonie Man, MD;  Location: [facility] CATH LAB;  Service: Cardiovascular;  Laterality: N/A;  . left shoulder      x 3  . NISSEN FUNDOPLICATION    . right elbow surgery     x 2  . right knee arthroscopy    . squamous cell skin cancer     Left Hand  . SUPRAVENTRICULAR TACHYCARDIA ABLATION N/A 01/16/2014   Procedure: SUPRAVENTRICULAR TACHYCARDIA ABLATION;  Surgeon: Evans Lance, MD;  Location: [facility] CATH LAB;  Service: Cardiovascular;  Laterality: N/A;    Social History   Socioeconomic History  . Marital status: Widowed    Spouse name: Not on file  . Number of children: 2  . Years of education: 63  . Highest education level: Not on file  Occupational History  . Occupation: [employer]  . Financial resource strain: Not on file  . Food insecurity    Worry: Not on file    Inability: Not on file  . Transportation needs    Medical: Not on file    Non-medical: Not on file  Tobacco Use  . Smoking status: Former Smoker    Packs/day: 2.50    Years: 50.00    Pack years: 125.00  Types: Cigarettes    Quit date: 05/05/2005    Years since quitting: 13.7  . Smokeless tobacco: Former Systems developer    Quit date: 05/03/2012  Substance and Sexual Activity  . Alcohol use: No    Alcohol/week: 0.0 standard drinks    Comment: last use 2013  . Drug use: No  . Sexual activity: Not on file    Comment: widowed, Veteran  Lifestyle  . Physical activity    Days per week: Not on file    Minutes per session: Not on file  . Stress: Not on file  Relationships  . Social Herbalist on phone: Not on file    Gets together: Not on file    Attends religious service: Not on file    Active member of club or organization: Not on file    Attends meetings of clubs or organizations: Not on file    Relationship status: Not on file  . Intimate partner violence    Fear of current or ex partner: Not on file    Emotionally  abused: Not on file    Physically abused: Not on file    Forced sexual activity: Not on file  Other Topics Concern  . Not on file  Social History Narrative   Patient drinks 3 cups of caffeine daily.   Patient is right handed.   Family History  Problem Relation Age of Onset  . Colon cancer Mother   . Colon cancer Maternal Aunt   . Colon cancer Maternal Uncle   . Heart attack Neg Hx   . Stroke Neg Hx       VITAL SIGNS BP (!) 109/57   Pulse 62   Temp 98.1 F (36.7 C) (Oral)   Resp (!) 26   Ht 6' (1.829 m)   Wt 202 lb 3.2 oz (91.7 kg)   SpO2 90%   BMI 27.42 kg/m   Outpatient Encounter Medications as of 02/08/2019  Medication Sig  . acetaminophen (TYLENOL) 325 MG tablet Take 650 mg by mouth every 6 (six) hours as needed.  [name] alprazolam (XANAX) 2 MG tablet Take 2 mg by mouth 2 (two) times daily.  . Amino Acids-Protein Hydrolys (FEEDING SUPPLEMENT, PRO-STAT SUGAR FREE 64,) LIQD Take 30 mLs by mouth 2 (two) times daily between meals.   [name] amitriptyline (ELAVIL) 100 MG tablet Take 100 mg by mouth at bedtime.  [name] apixaban (ELIQUIS) 2.5 MG TABS tablet Take 5 mg by mouth 2 (two) times daily.   [name] atorvastatin (LIPITOR) 40 MG tablet Take 40 mg by mouth daily.  Roseanne Kaufman [place]-Castor Oil (VENELEX) OINT Apply 1 application topically See admin instructions. Apply to coccyx, sacrum, bilateral buttocks each shift and prn  . bisacodyl (DULCOLAX) 5 MG EC tablet Take 5 mg by mouth daily as needed for mild constipation or moderate constipation.  . budesonide-formoterol (SYMBICORT) 160-4.5 MCG/ACT inhaler Inhale 2 puffs into the lungs every morning.  . camphor-menthol (ANTI-ITCH) lotion Apply 1 application topically 2 (two) times daily as needed for itching. Sparingly  . cefTRIAXone (ROCEPHIN) IVPB Inject 2 g into the vein daily.  . collagenase (SANTYL) ointment Apply 1 application topically daily. Apply per treatment orders  . docusate sodium (COLACE) 100 MG capsule Take 100 mg by mouth daily as  needed for mild constipation.  . finasteride (PROSCAR) 5 MG tablet Take 5 mg by mouth every morning.   . furosemide (LASIX) 40 MG tablet Take 1 tablet (40 mg total) by mouth daily.  [name]  gabapentin (NEURONTIN) 800 MG tablet Take 1 tablet (800 mg total) by mouth 3 (three) times daily.  [name] guaiFENesin (MUCINEX) 600 MG 12 hr tablet Take 600 mg by mouth 2 (two) times daily.   [name] ipratropium-albuterol (DUONEB) 0.5-2.5 (3) MG/3ML SOLN Take 3 mLs by nebulization every 4 (four) hours as needed (For wheezing and worsening shortness of breath or respiratory distress).  [name] latanoprost (XALATAN) 0.005 % ophthalmic solution Place 1 drop into both eyes at bedtime.  . metoprolol tartrate (LOPRESSOR) 25 MG tablet Take 25 mg by mouth 2 (two) times daily.   . Multiple Vitamins-Minerals (MULTIVITAMIN WITH MINERALS) tablet Take 1 tablet by mouth daily.  . nitroGLYCERIN (NITROSTAT) 0.4 MG SL tablet Place 1 tablet (0.4 mg total) under the tongue every 5 (five) minutes x 3 doses as needed for chest pain.  . NON FORMULARY Diet: Regular, NAS, Consistent Carbohydrate  . ondansetron (ZOFRAN) 4 MG tablet Take 4 mg by mouth every 6 (six) hours.   . OXYGEN Oxygen @@ 3L/mn via [place] Special Instructions: Document O2 sat qshift. Every Shift Day, Evening, Night  . potassium chloride (K-DUR) 10 MEQ tablet Take 1 tablet (10 mEq total) by mouth daily.  . tamsulosin (FLOMAX) 0.4 MG CAPS capsule Take 0.4 mg by mouth at bedtime.  . [DISCONTINUED] hydrocortisone cream 1 % Apply 1 application topically 2 (two) times daily as needed. Apply sparingtly   . [DISCONTINUED] lamoTRIgine (LAMICTAL) 100 MG tablet Take 1 tablet (100 mg total) by mouth 2 (two) times daily.   No facility-administered encounter medications on file as of 02/08/2019.      SIGNIFICANT DIAGNOSTIC EXAMS  PREVIOUS;   01-01-19: chest x-ray:  1. Cardiomegaly with vascular congestion. 2. Small bilateral pleural effusions and bibasilar densities similar to prior radiograph   01-02-19: bilateral lower extremity venous doppler:  1. No evidence of deep venous thrombosis in either lower extremity. 2. Increased pulsatility of the venous waveforms suggests elevated right heart pressures. Findings can be seen in tricuspid regurgitation, right heart failure, pulmonary hypertension and COPD.  01-02-19: 2-d echo:   1. The left ventricle has normal systolic function, with an ejection fraction of 55-60%. The cavity size was normal. There is mildly increased left ventricular wall thickness. Left ventricular diastolic Doppler parameters are indeterminate.  2. The right ventricle has normal systolic function. The cavity was normal. There is no increase in right ventricular wall thickness. Right ventricular systolic pressure could not be assessed.  3. No evidence of mitral valve stenosis.  4. The aortic valve is tricuspid. Mild thickening of the aortic valve. Mild calcification of the aortic valve. No stenosis of the aortic valve.  5. The aorta is normal unless otherwise noted.  6. The ascending aorta is normal in size and structure.  01-03-19: chest x-ray: Stable cardiomegaly, pulmonary vascular congestion, and bilateral pleural-parenchymal scarring. No acute findings.  01-28-19: chest x-ray: Similar appearance of reticulonodular opacities in the mid and lower lungs, which has progressed slowly from 2016 on chest x-ray exams. While the changes most likely represent chronic interstitial fibrosis/scarring, superimposed infection cannot be excluded radiographically   TODAY;   02-04-19: chest x-ray: Extensive bilateral calcified pleural plaques. Chronic increased markings throughout the lungs, favor scarring. No definite acute process   LABS REVIEWED PREVIOUS;   01-01-19: wbc 8.2; hgb 14.2; hct 51.1; mcv 103.4; plt 216; glucose 100; bun 46; creat 1.88; k+ 5.8; na++ 142; ca 8.7; liver normal albumin 3.1 01-02-19: wbc 5.3; hgb 145; hct 51.8; mcv 101.2; plt 219; glucose 132;  bun 45; creat  1.52; k+ 4.7; na++ 143; ca 9.0 ;liver normal albumin 3.3; tsh 0.435 vit B 12; 296; urine culture: staphylococcus species 01-05-19: glucose 129; bun 37; creat 0.98; k+ 4.1; na++ 134; ca 8.5; mag 1.9 01-10-19: glucose 111; bun 38; creat 0.85; k+ 3.5; na++ 130; ca 8.7  01-28-19: wbc 8.6; hgb 11.0 hct 36.1; mcv 93.0 plt 277; glucose 125; bun 20; creat 1.08; k+ 4.5; na++ 134; ca 8.9 blood and urine cultures: no growth. 02-04-19: wbc 18.0; hgb 11.0; hct 36.3; mcv 92.4; plt 545  TODAY;   02-08-19: wbc 27.4; hgb 10.4; hct 34.0; mcv 93.7; plt 478; glucose 201; bun 36; creat 2.31; k+ 4.8; na++ 131; ca 8.7; ast 1485.0; alt 846.0; albumin 2.1; amalse 16; lipase 22; direct bili 0.1 d-dimer: 4.15  c-diff: neg     Review of Systems  Unable to perform ROS: Medical condition    Physical Exam Constitutional:      General: He is not in acute distress.    Appearance: He is well-developed. He is not diaphoretic.  Eyes:     Comments: History of cataract extraction  Neck:     Musculoskeletal: Neck supple.     Thyroid: No thyromegaly.  Cardiovascular:     Rate and Rhythm: Regular rhythm. Bradycardia present.     Heart sounds: Normal heart sounds.     Comments: History of SVT ablation; ptca  Pedal pulses present Feet cool to touch Pulmonary:     Effort: Pulmonary effort is normal. No respiratory distress.     Breath sounds: Normal breath sounds.     Comments: 02 dependent Bilateral lobes diminished sounds  Abdominal:     General: Bowel sounds are normal. There is no distension.     Palpations: Abdomen is soft.     Tenderness: There is abdominal tenderness.     Comments: Has bruising present on lower quads Mild right upper quad tenderness   Genitourinary:    Comments: Foley  Musculoskeletal:     Right lower leg: No edema.     Left lower leg: No edema.     Comments: Is able to move all extremities   Lymphadenopathy:     Cervical: No cervical adenopathy.  Skin:    General: Skin is warm and dry.      Comments: Left heel: unstaged soft eschar: 7 x 8 cm Right heel stage IV: soft eschar: 4.5 x 3.5 cm Left buttock: unstaged: soft eschar: 5 x 5 cm with peri-area DTI No signs of infection present   Neurological:     Comments: Is aware       ASSESSMENT/ PLAN:  TODAY  1. Elevated liver enzymes 2. HCAP 3. Elevated d-dimer' 4. N/v 5. Acute renal failure   Will get liver ultrasound Will check ferritin hepatitis panel Will change zofran 4 mg every 6 hours Will stop zoysn  Will begin rocephin 2 gm INV through 02-12-19 Will stop lipitor and tylenol Will begin NS at 100 cc per hour for 2 liters.    ADDENDUM;  WILL SEND HIM TO THE ED FOR FURTHER WORKUP        MD is aware of resident's narcotic use and is in agreement with current plan of care. We will attempt to wean resident as appropriate.  Ok Edwards NP [facility]  Contact [phone] Monday through Friday 8am- 5pm  After hours call [phone]

## 2019-02-08 NOTE — H&P (Signed)
History and Physical    Curtis [name] [id number] DOB: Mar 05, 1946 DOA: 02/08/2019  PCP: [name], MD   Patient coming from: [facility].  I have personally briefly reviewed patient's old medical records in [place]  Chief Complaint: Elevated d-dimer.  HPI: Curtis [name] is a 73 y.o. male with medical history significant of AAA, osteoarthritis, asbestosis, CAD, history of an STEMI, cancer, depression, diverticulosis,/diabetic colitis, GERD, glaucoma, abdominal hernia, hyperlipidemia, hypertension, IBS, peripheral vascular disease, history of pneumonia, reflex sympathetic dystrophy, stage III and IV decubiti ulcers who is brought to the emergency department due to elevated d-dimer and decreased mentation.  The patient has been getting IV antibiotics at the facility for pneumonia.  He is unable to provide further information at this time.  ED Course: Patient received IV vancomycin, metronidazole and cefepime in the emergency department. Labs done earlier today showed patient with a white count of 27.4, hemoglobin 10.4 g/dL and platelets 478.  Sodium 131, potassium 4.8, chloride 92 and CO2 27 mmol/L.  His glucose 201, BUN 36 and creatinine 2.31 mg/dL.  His baseline creatinine is normal and most recently was 1.08, 0.85 and 0.99 mg/dL within the last 5 weeks.  CMP results are also significant for elevated AST 1485 and ALT 846.Chest radiograph shows unchanged reticulonodular opacities.  CT abdomen did not show any acute abnormalities.  Please see images and full radiology reports for further detail.  Review of Systems: As per HPI otherwise 10 point review of systems negative.    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 s/p cath in 2015 showing occluded RCA and high-grade LCx stenosis treated with DESx2 to LCx   Cancer [facility])    Depression     Diverticulitis    hospital 2011 [facility]   Diverticulosis    GERD (gastroesophageal reflux disease)    Glaucoma 2016   bilateral   Hernia of unspecified site of abdominal cavity without mention of obstruction or gangrene    hiatal   Hyperlipidemia LDL goal <70 04/20/2014   Hypertension    IBS (irritable bowel syndrome)    Myocardial infarction ([place]) 01/2014   NSTEMI   Peripheral vascular disease (HCC)    ankle brachial index of 0.79 on the right and 0.61 on the left.    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    SOB (shortness of breath)     Past Surgical History:  Procedure Laterality Date   CARDIAC CATHETERIZATION     CATARACT EXTRACTION     CORONARY ANGIOPLASTY     HERNIA REPAIR     LEFT HEART CATHETERIZATION WITH CORONARY ANGIOGRAM N/A 01/17/2014   Procedure: LEFT HEART CATHETERIZATION WITH CORONARY ANGIOGRAM;  Surgeon: Leonie Man, MD;  Location: [facility] CATH LAB;  Service: Cardiovascular;  Laterality: N/A;   left shoulder      x 3   NISSEN FUNDOPLICATION     right elbow surgery     x 2   right knee arthroscopy     squamous cell skin cancer     Left Hand   SUPRAVENTRICULAR TACHYCARDIA ABLATION N/A 01/16/2014   Procedure: SUPRAVENTRICULAR TACHYCARDIA ABLATION;  Surgeon: Evans Lance, MD;  Location: [facility] CATH LAB;  Service: Cardiovascular;  Laterality: N/A;     reports  that he quit smoking about 13 years ago. His smoking use included cigarettes. He has a 125.00 pack-year smoking history. He quit smokeless tobacco use about 6 years ago. He reports that he does not drink alcohol or use drugs.  Allergies  Allergen Reactions   Codeine Itching   Trileptal [Oxcarbazepine]     Other reaction(s): Other (See Comments) Abnormal sodium levels Hyponatremia    Family History  Problem Relation Age of Onset   Colon cancer Mother    Colon cancer Maternal Aunt     Colon cancer Maternal Uncle    Heart attack Neg Hx    Stroke Neg Hx    Prior to Admission medications   Medication Sig Start Date End Date Taking? Authorizing Provider  alprazolam [name]) 2 MG tablet Take 2 mg by mouth 2 (two) times daily.    [provider]  Amino Acids-Protein Hydrolys (FEEDING SUPPLEMENT, PRO-STAT SUGAR FREE 64,) LIQD Take 30 mLs by mouth 2 (two) times daily between meals.     [provider]  amitriptyline (ELAVIL) 100 MG tablet Take 100 mg by mouth at bedtime.    [provider]  apixaban (ELIQUIS) 2.5 MG TABS tablet Take 5 mg by mouth 2 (two) times daily.     [provider]  Janne Lab Oil [facility]) OINT Apply 1 application topically See admin instructions. Apply to coccyx, sacrum, bilateral buttocks each shift and prn    [provider]  bisacodyl (DULCOLAX) 5 MG EC tablet Take 5 mg by mouth daily as needed for mild constipation or moderate constipation.    [provider]  budesonide-formoterol (SYMBICORT) 160-4.5 MCG/ACT inhaler Inhale 2 puffs into the lungs 2 (two) times daily.    [provider]  camphor-menthol (ANTI-ITCH) lotion Apply 1 application topically 2 (two) times daily as needed for itching. Sparingly    [provider]  cefTRIAXone (ROCEPHIN) IVPB Inject 2 g into the vein daily. 02/08/19 02/12/19  [provider]  collagenase (SANTYL) ointment Apply 1 application topically daily. Apply per treatment orders    [provider]  docusate sodium (COLACE) 100 MG capsule Take 100 mg by mouth daily as needed for mild constipation.    [provider]  finasteride (PROSCAR) 5 MG tablet Take 5 mg by mouth every morning.     [provider]  furosemide (LASIX) 40 MG tablet Take 1 tablet (40 mg total) by mouth daily. 01/11/19   Barton Dubois, MD  gabapentin (NEURONTIN) 800 MG tablet Take 1 tablet (800 mg total) by mouth 3 (three) times daily. 08/23/18    Ward Givens, NP  guaiFENesin (MUCINEX) 600 MG 12 hr tablet Take 600 mg by mouth 2 (two) times daily.     [provider]  ipratropium-albuterol (DUONEB) 0.5-2.5 (3) MG/3ML SOLN Take 3 mLs by nebulization every 4 (four) hours as needed (For wheezing and worsening shortness of breath or respiratory distress).    [provider]  latanoprost (XALATAN) 0.005 % ophthalmic solution Place 1 drop into both eyes at bedtime.    [provider]  metoprolol tartrate (LOPRESSOR) 25 MG tablet Take 25 mg by mouth 2 (two) times daily.     [provider]  Multiple Vitamins-Minerals (MULTIVITAMIN WITH MINERALS) tablet Take 1 tablet by mouth daily.    [provider]  nitroGLYCERIN (NITROSTAT) 0.4 MG SL tablet Place 1 tablet (0.4 mg total) under the tongue every 5 (five) minutes x 3 doses as needed for chest pain. 04/20/14   Dorene Ar,  Otilio Carpen, NP  NON FORMULARY Diet: Regular, NAS, Consistent Carbohydrate    [provider]  ondansetron (ZOFRAN) 4 MG tablet Take 4 mg by mouth every 6 (six) hours.     [provider]  OXYGEN Oxygen @@ 3L/mn via [place] Special Instructions: Document O2 sat qshift. Every Shift Day, Evening, Night    [provider]  potassium chloride (K-DUR) 10 MEQ tablet Take 1 tablet (10 mEq total) by mouth daily. 01/07/19   Barton Dubois, MD  tamsulosin (FLOMAX) 0.4 MG CAPS capsule Take 0.4 mg by mouth at bedtime.    [provider]  lamoTRIgine (LAMICTAL) 100 MG tablet Take 1 tablet (100 mg total) by mouth 2 (two) times daily. 01/23/16 01/29/16  Kathrynn Ducking, MD    Physical Exam: Vitals:   02/08/19 2055 02/08/19 2100 02/08/19 2130 02/08/19 2230  BP:  122/65 113/63 108/61  Pulse:  74    Resp:  20 (!) 22 16  Temp:  (!) 97.5 F (36.4 C)    SpO2:  100%  98%  Weight: 125.8 kg       Constitutional: Somnolent, but arousable. Eyes: PERRL, lids and conjunctivae normal ENMT: Nasal cannula on.  Mucous membranes are  dry. Posterior pharynx clear of any exudate or lesions. Neck: normal, supple, no masses, no thyromegaly Respiratory: Decreased breath sounds on bases with bilateral rhonchi, no wheezing, no crackles. Normal respiratory effort. No accessory muscle use.  Cardiovascular: Regular rate and rhythm, no murmurs / rubs / gallops. No extremity edema. 2+ pedal pulses. No carotid bruits.  Abdomen: Obese, nondistended.  Bowel sounds positive.  Soft, no tenderness, no masses palpated. No hepatosplenomegaly.  Musculoskeletal: no clubbing / cyanosis.Good ROM, no contractures. Normal muscle tone.  Skin: Stage IV sacral and stage II left heel decubiti Neurologic: CN 2-12 grossly intact.  All extremities. Psychiatric: Somnolent, but arousable.  Answers simple questions.  Labs on Admission: I have personally reviewed following labs and imaging studies  CBC: Recent Labs  Lab 02/04/19 1300 02/08/19 1300  WBC 18.0* 27.4*  NEUTROABS 14.1*  --   HGB 11.0* 10.4*  HCT 36.3* 34.0*  MCV 92.4 93.7  PLT 545* 123456*   Basic Metabolic Panel: Recent Labs  Lab 02/08/19 1300  NA 131*  K 4.8  CL 92*  CO2 27  GLUCOSE 201*  BUN 36*  CREATININE 2.31*  CALCIUM 8.7*   GFR: Estimated Creatinine Clearance: 38.5 mL/min (A) (by C-G formula based on SCr of 2.31 mg/dL (H)). Liver Function Tests: Recent Labs  Lab 02/08/19 1300  AST 1,485*  ALT 846*  ALKPHOS 88  BILITOT 0.5  PROT 6.3*  ALBUMIN 2.1*   Recent Labs  Lab 02/08/19 1545  LIPASE 22  AMYLASE 16*   No results for input(s): AMMONIA in the last 168 hours. Coagulation Profile: No results for input(s): INR, PROTIME in the last 168 hours. Cardiac Enzymes: No results for input(s): CKTOTAL, CKMB, CKMBINDEX, TROPONINI in the last 168 hours. BNP (last 3 results) No results for input(s): PROBNP in the last 8760 hours. HbA1C: No results for input(s): HGBA1C in the last 72 hours. CBG: No results for input(s): GLUCAP in the last 168 hours. Lipid  Profile: No results for input(s): CHOL, HDL, LDLCALC, TRIG, CHOLHDL, LDLDIRECT in the last 72 hours. Thyroid Function Tests: No results for input(s): TSH, T4TOTAL, FREET4, T3FREE, THYROIDAB in the last 72 hours. Anemia Panel: Recent Labs    02/08/19 1545  FERRITIN 1,701*   Urine analysis:    Component Value Date/Time  COLORURINE YELLOW 01/28/2019 0010   APPEARANCEUR HAZY (A) 01/28/2019 0010   LABSPEC 1.019 01/28/2019 0010   PHURINE 5.0 01/28/2019 0010   GLUCOSEU NEGATIVE 01/28/2019 0010   HGBUR NEGATIVE 01/28/2019 0010   BILIRUBINUR NEGATIVE 01/28/2019 0010   KETONESUR NEGATIVE 01/28/2019 0010   PROTEINUR 30 (A) 01/28/2019 0010   UROBILINOGEN 0.2 02/12/2015 1859   NITRITE NEGATIVE 01/28/2019 0010   LEUKOCYTESUR SMALL (A) 01/28/2019 0010    Radiological Exams on Admission: Ct Abdomen Pelvis Wo Contrast  Result Date: 02/08/2019 CLINICAL DATA:  Nausea, vomiting, and diarrhea. Abnormal LFTs. Acute renal failure. EXAM: CT ABDOMEN AND PELVIS WITHOUT CONTRAST TECHNIQUE: Multidetector CT imaging of the abdomen and pelvis was performed following the standard protocol without IV contrast. COMPARISON:  CT abdomen pelvis dated November 10, 2014. FINDINGS: Lower chest: No acute abnormality. Cardiomegaly. Chronic atelectasis and scarring in the lung basis with multiple calcified pleural plaques. Hepatobiliary: No focal liver abnormality is seen. No gallstones, gallbladder wall thickening, or biliary dilatation. Pancreas: Unremarkable. No pancreatic ductal dilatation or surrounding inflammatory changes. Spleen: Normal in size without focal abnormality. Adrenals/Urinary Tract: The adrenal glands are unremarkable. 1.9 cm right renal simple cyst, mildly enlarged since 2016. No renal calculi or hydronephrosis. The bladder is decompressed by Foley catheter. Stomach/Bowel: Prior Nissen fundoplication. Stomach is otherwise within normal limits. No bowel wall thickening, distention, or surrounding inflammatory  changes. Mild left-sided colonic diverticulosis. Normal appendix. Vascular/Lymphatic: Extensive aortoiliac atherosclerosis with unchanged 3.2 cm infrarenal abdominal aortic aneurysm. No enlarged abdominal or pelvic lymph nodes. Reproductive: Prostate is unremarkable. Other: New small ascites.  No pneumoperitoneum. Musculoskeletal: No acute or significant osseous findings. IMPRESSION: 1.  No acute intra-abdominal process. 2. New small ascites. 3. Unchanged 3.2 cm infrarenal abdominal aortic aneurysm. Recommend followup by ultrasound in 3 years. This recommendation follows ACR consensus guidelines: White Paper of the ACR Incidental Findings Committee II on Vascular Findings. J Am Coll Radiol 2013; 10:789-794. Aortic aneurysm NOS (ICD10-I71.9) 4.  Aortic atherosclerosis (ICD10-I70.0). Electronically Signed   By: Titus Dubin M.D.   On: 02/08/2019 22:48   Dg Chest Port 1 View  Result Date: 02/08/2019 CLINICAL DATA:  Cough, elevated D-dimer EXAM: PORTABLE CHEST 1 VIEW COMPARISON:  February 04, 2019 FINDINGS: There is cardiomegaly. Increased reticulonodular opacities are again noted throughout both lungs. Calcified pleural plaques are again noted. A right-sided PICC catheter is seen with the tip just entering the upper SVC. No pneumothorax. No acute osseous abnormality. IMPRESSION: Unchanged reticulonodular opacities throughout both lungs, likely due to chronic lung changes, however cannot exclude superimposed infectious etiology. Electronically Signed   By: Prudencio Pair M.D.   On: 02/08/2019 22:28    EKG: Independently reviewed.  Vent. rate 87 BPM PR interval * ms QRS duration 122 ms QT/QTc 397/478 ms P-R-T axes * 50 0 Atrial flutter with predominant 3:1 AV block Right bundle branch block Borderline ST elevation, lateral leads  Assessment/Plan Principal Problem:   Acute hepatitis Admit to telemetry/inpatient. Keep n.p.o. Continue IV fluids. Check RUQ ultrasound. Avoid hepatotoxic  agents. Follow-up acute hepatitis panel. Monitor hepatic function test daily.  Active Problems:   AKI (acute kidney injury) ([place]) Continue IV fluids. Monitor intake and output. Follow-up renal function electrolytes.    HCAP (healthcare-associated pneumonia) Continue supplemental oxygen. Continue vancomycin and cefepime. Duo nebs 4 times a day.    GERD Famotidine p.o. while in the hospital.    HTN (hypertension) Continue metoprolol 25 mg p.o. twice daily.    SVT (supraventricular tachycardia) s/p ablation 01/16/2014 Currently in 321  flutter. Continue apixaban and metoprolol once doses confirmed.    Pressure ulcer of right heel, stage 2 ([place]) Continue local care.    DVT prophylaxis: On apixaban. Code Status: Partial code (no mechanical ventilation). Family Communication: Disposition Plan: Admit for further evaluation and treatment. Consults called: Admission status: Inpatient/telemetry.   Reubin Milan MD [facility]  If 7PM-7AM, please contact night-coverage  02/08/2019, 11:45 PM   This document was prepared using Dragon voice recognition software and  may contain some unintended transcription errors.

## 2019-02-08 NOTE — ED Provider Notes (Signed)
[facility] EMERGENCY DEPARTMENT Provider Note   CSN: [id number] Arrival date & time: 02/08/19  2041     History   Chief Complaint Chief Complaint  Patient presents with   Abnormal Lab    HPI Curtis [name] is a 73 y.o. male.     HPI  This patient is a debilitated 73 year old male who has a known history of asbestosis of the lungs, currently on antibiotics through a right midline catheter for pneumonia.  He has had a decline in his status with increasing weakness, his wounds are getting worse on his buttock and he was noted to be hypoxic, labs were obtained showing a d-dimer that was elevated above 4 and LFTs that were in the multiple 100s to over 1000 range.  He was sent to the emergency department for further evaluation this evening at 9:00 PM.  The patient is not able to give me much in the way of information, he appears pleasantly confused  Past Medical History:  Diagnosis Date   AAA (abdominal aortic aneurysm), stable 04/20/2014   Arthritis    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)    CAD (coronary artery disease)    a. s/p cath in 2015 showing occluded RCA and high-grade LCx stenosis treated with DESx2 to LCx   Cancer [facility])    Depression    Diverticulitis    hospital 2011 [facility]   Diverticulosis    GERD (gastroesophageal reflux disease)    Glaucoma 2016   bilateral   Hernia of unspecified site of abdominal cavity without mention of obstruction or gangrene    hiatal   Hyperlipidemia LDL goal <70 04/20/2014   Hypertension    IBS (irritable bowel syndrome)    Myocardial infarction ([place]) 01/2014   NSTEMI   Peripheral vascular disease ([place])    ankle brachial index of 0.79 on the right and 0.61 on the left.    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    SOB (shortness of breath)     Patient Active Problem List   Diagnosis Date Noted   D-dimer, elevated 02/08/2019    Elevated liver enzymes 02/08/2019   Nausea vomiting and diarrhea 02/08/2019   Physical deconditioning 02/04/2019   Pressure injury of deep tissue of left heel 01/27/2019   Atrial flutter ([place]) 01/15/2019   Hypokalemia 01/15/2019   Bilateral open angle glaucoma 01/15/2019   Anxiety disorder [id number]   Acute diastolic CHF (congestive heart failure) ([place]) 01/03/2019   Pressure ulcer of right heel, stage 2 ([place]) 01/03/2019   COPD exacerbation ([place]) 01/02/2019   Acute on chronic respiratory failure with hypoxia and hypercapnia ([place]) 01/02/2019   Acute CHF (congestive heart failure) ([place]) 01/02/2019   Hyperkalemia 01/02/2019   Sepsis ([place]) 12/15/2018   Situational mixed anxiety and depressive disorder 02/28/2018   Carotid artery disease ([place]) 02/27/2016   Acute hyponatremia 02/12/2015   Hereditary and idiopathic peripheral neuropathy 01/30/2015   Acute on chronic respiratory failure ([place]) 05/08/2014   Anemia, iron deficiency 05/08/2014   Hyponatremia 05/08/2014   Urinary retention 05/08/2014   Chronic respiratory failure with hypoxia ([place]) 04/30/2014   Shortness of breath    HCAP (healthcare-associated pneumonia) 04/24/2014   Hyperlipidemia LDL goal <70 04/20/2014   AAA (abdominal aortic aneurysm), stable 04/20/2014   Abdominal pain, generalized    SOB (shortness of breath)    Peripheral vascular disease (HCC)    Presence of drug coated stent in left circumflex coronary  artery: Promus DES 3.5 mm x 38 mm (3.9 mm) 01/17/2014    Class: Acute   CAD (coronary artery disease), native coronary artery    SVT (supraventricular tachycardia) s/p ablation 01/16/2014 01/16/2014   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   TIA (transient ischemic attack) 09/06/2013   HTN (hypertension) 09/05/2013   AKI (acute kidney injury) ([place]) 11/01/2012   TOBACCO ABUSE, hx 08/23/2007   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   Asbestosis ([place])  08/12/2007   Reflex sympathetic dystrophy 05/29/2007   GERD 05/29/2007    Past Surgical History:  Procedure Laterality Date   CARDIAC CATHETERIZATION     CATARACT EXTRACTION     CORONARY ANGIOPLASTY     HERNIA REPAIR     LEFT HEART CATHETERIZATION WITH CORONARY ANGIOGRAM N/A 01/17/2014   Procedure: LEFT HEART CATHETERIZATION WITH CORONARY ANGIOGRAM;  Surgeon: Leonie Man, MD;  Location: [facility] CATH LAB;  Service: Cardiovascular;  Laterality: N/A;   left shoulder      x 3   NISSEN FUNDOPLICATION     right elbow surgery     x 2   right knee arthroscopy     squamous cell skin cancer     Left Hand   SUPRAVENTRICULAR TACHYCARDIA ABLATION N/A 01/16/2014   Procedure: SUPRAVENTRICULAR TACHYCARDIA ABLATION;  Surgeon: Evans Lance, MD;  Location: [facility] CATH LAB;  Service: Cardiovascular;  Laterality: N/A;        Home Medications    Prior to Admission medications   Medication Sig Start Date End Date Taking? Authorizing Provider  alprazolam [name]) 2 MG tablet Take 2 mg by mouth 2 (two) times daily.    [provider]  Amino Acids-Protein Hydrolys (FEEDING SUPPLEMENT, PRO-STAT SUGAR FREE 64,) LIQD Take 30 mLs by mouth 2 (two) times daily between meals.     [provider]  amitriptyline (ELAVIL) 100 MG tablet Take 100 mg by mouth at bedtime.    [provider]  apixaban (ELIQUIS) 2.5 MG TABS tablet Take 5 mg by mouth 2 (two) times daily.     [provider]  Janne Lab Oil [facility]) OINT Apply 1 application topically See admin instructions. Apply to coccyx, sacrum, bilateral buttocks each shift and prn    [provider]  bisacodyl (DULCOLAX) 5 MG EC tablet Take 5 mg by mouth daily as needed for mild constipation or moderate constipation.    [provider]  budesonide-formoterol (SYMBICORT) 160-4.5 MCG/ACT inhaler Inhale 2 puffs into the lungs 2 (two) times daily.    [provider]  camphor-menthol  (ANTI-ITCH) lotion Apply 1 application topically 2 (two) times daily as needed for itching. Sparingly    [provider]  cefTRIAXone (ROCEPHIN) IVPB Inject 2 g into the vein daily. 02/08/19 02/12/19  [provider]  collagenase (SANTYL) ointment Apply 1 application topically daily. Apply per treatment orders    [provider]  docusate sodium (COLACE) 100 MG capsule Take 100 mg by mouth daily as needed for mild constipation.    [provider]  finasteride (PROSCAR) 5 MG tablet Take 5 mg by mouth every morning.     [provider]  furosemide (LASIX) 40 MG tablet Take 1 tablet (40 mg total) by mouth daily. 01/11/19   Barton Dubois, MD  gabapentin (NEURONTIN) 800 MG tablet Take 1 tablet (800 mg total) by mouth 3 (three) times daily. 08/23/18   Ward Givens, NP  guaiFENesin (MUCINEX) 600 MG 12 hr tablet Take 600 mg by mouth 2 (  two) times daily.     [provider]  ipratropium-albuterol (DUONEB) 0.5-2.5 (3) MG/3ML SOLN Take 3 mLs by nebulization every 4 (four) hours as needed (For wheezing and worsening shortness of breath or respiratory distress).    [provider]  latanoprost (XALATAN) 0.005 % ophthalmic solution Place 1 drop into both eyes at bedtime.    [provider]  metoprolol tartrate (LOPRESSOR) 25 MG tablet Take 25 mg by mouth 2 (two) times daily.     [provider]  Multiple Vitamins-Minerals (MULTIVITAMIN WITH MINERALS) tablet Take 1 tablet by mouth daily.    [provider]  nitroGLYCERIN (NITROSTAT) 0.4 MG SL tablet Place 1 tablet (0.4 mg total) under the tongue every 5 (five) minutes x 3 doses as needed for chest pain. 04/20/14   [name], NP  NON FORMULARY Diet: Regular, NAS, Consistent Carbohydrate    [provider]  ondansetron (ZOFRAN) 4 MG tablet Take 4 mg by mouth every 6 (six) hours.     [provider]  OXYGEN Oxygen @@ 3L/mn via [facility] Instructions:  Document O2 sat qshift. Every Shift Day, Evening, Night    [provider]  potassium chloride (K-DUR) 10 MEQ tablet Take 1 tablet (10 mEq total) by mouth daily. 01/07/19   Barton Dubois, MD  tamsulosin (FLOMAX) 0.4 MG CAPS capsule Take 0.4 mg by mouth at bedtime.    [provider]  lamoTRIgine (LAMICTAL) 100 MG tablet Take 1 tablet (100 mg total) by mouth 2 (two) times daily. 01/23/16 01/29/16  Kathrynn Ducking, MD    Family History Family History  Problem Relation Age of Onset   Colon cancer Mother    Colon cancer Maternal Aunt    Colon cancer Maternal Uncle    Heart attack Neg Hx    Stroke Neg Hx     Social History Social History   Tobacco Use   Smoking status: Former Smoker    Packs/day: 2.50    Years: 50.00    Pack years: 125.00    Types: Cigarettes    Quit date: 05/05/2005    Years since quitting: 13.7   Smokeless tobacco: Former Systems developer    Quit date: 05/03/2012  Substance Use Topics   Alcohol use: No    Alcohol/week: 0.0 standard drinks    Comment: last use 2013   Drug use: No     Allergies   Codeine and Trileptal [oxcarbazepine]   Review of Systems Review of Systems  Unable to perform ROS: Mental status change     Physical Exam Updated Vital Signs BP 108/61    Pulse 74    Temp (!) 97.5 F (36.4 C)    Resp 16    Wt 125.8 kg    SpO2 98%    BMI 37.62 kg/m   Physical Exam Vitals signs and nursing note reviewed.  Constitutional:      General: He is not in acute distress.    Appearance: He is well-developed.  HENT:     Head: Normocephalic and atraumatic.     Mouth/Throat:     Pharynx: No oropharyngeal exudate.  Eyes:     General: No scleral icterus.       Right eye: No discharge.        Left eye: No discharge.     Conjunctiva/sclera: Conjunctivae normal.     Pupils: Pupils are equal, round, and reactive to light.  Neck:     Musculoskeletal: Normal range of motion and neck supple.  Thyroid: No thyromegaly.     Vascular:  No JVD.  Cardiovascular:     Rate and Rhythm: Normal rate and regular rhythm.     Heart sounds: Normal heart sounds. No murmur. No friction rub. No gallop.   Pulmonary:     Effort: Pulmonary effort is normal. No respiratory distress.     Breath sounds: Rhonchi and rales present. No wheezing.  Abdominal:     General: Bowel sounds are normal. There is no distension.     Palpations: Abdomen is soft. There is no mass.     Tenderness: There is abdominal tenderness.     Comments: Right upper quadrant tenderness without guarding  Genitourinary:    Comments: Foley catheter in place Musculoskeletal: Normal range of motion.        General: No tenderness.     Right lower leg: No edema.     Left lower leg: Edema present.     Comments: Asymmetrical lower extremity edema left greater than right  Lymphadenopathy:     Cervical: No cervical adenopathy.  Skin:    General: Skin is warm and dry.     Findings: No erythema or rash.  Neurological:     Mental Status: He is alert.     Coordination: Coordination normal.     Comments: The patient is able to talk, he is generally diffusely weak but awake  Psychiatric:        Behavior: Behavior normal.      ED Treatments / Results  Labs (all labs ordered are listed, but only abnormal results are displayed) Labs Reviewed  LACTIC ACID, PLASMA - Abnormal; Notable for the following components:      Result Value   Lactic Acid, Venous 2.5 (*)    All other components within normal limits  URINE CULTURE  SARS CORONAVIRUS 2 (HOSPITAL ORDER, [place])  URINALYSIS, ROUTINE W REFLEX MICROSCOPIC    EKG None  Radiology Ct Abdomen Pelvis Wo Contrast  Result Date: 02/08/2019 CLINICAL DATA:  Nausea, vomiting, and diarrhea. Abnormal LFTs. Acute renal failure. EXAM: CT ABDOMEN AND PELVIS WITHOUT CONTRAST TECHNIQUE: Multidetector CT imaging of the abdomen and pelvis was performed following the standard protocol without IV contrast.  COMPARISON:  CT abdomen pelvis dated November 10, 2014. FINDINGS: Lower chest: No acute abnormality. Cardiomegaly. Chronic atelectasis and scarring in the lung basis with multiple calcified pleural plaques. Hepatobiliary: No focal liver abnormality is seen. No gallstones, gallbladder wall thickening, or biliary dilatation. Pancreas: Unremarkable. No pancreatic ductal dilatation or surrounding inflammatory changes. Spleen: Normal in size without focal abnormality. Adrenals/Urinary Tract: The adrenal glands are unremarkable. 1.9 cm right renal simple cyst, mildly enlarged since 2016. No renal calculi or hydronephrosis. The bladder is decompressed by Foley catheter. Stomach/Bowel: Prior Nissen fundoplication. Stomach is otherwise within normal limits. No bowel wall thickening, distention, or surrounding inflammatory changes. Mild left-sided colonic diverticulosis. Normal appendix. Vascular/Lymphatic: Extensive aortoiliac atherosclerosis with unchanged 3.2 cm infrarenal abdominal aortic aneurysm. No enlarged abdominal or pelvic lymph nodes. Reproductive: Prostate is unremarkable. Other: New small ascites.  No pneumoperitoneum. Musculoskeletal: No acute or significant osseous findings. IMPRESSION: 1.  No acute intra-abdominal process. 2. New small ascites. 3. Unchanged 3.2 cm infrarenal abdominal aortic aneurysm. Recommend followup by ultrasound in 3 years. This recommendation follows ACR consensus guidelines: White Paper of the ACR Incidental Findings Committee II on Vascular Findings. J Am Coll Radiol 2013; 10:789-794. Aortic aneurysm NOS (ICD10-I71.9) 4.  Aortic atherosclerosis (ICD10-I70.0). Electronically Signed   By: Huntley Dec  Derry M.D.   On: 02/08/2019 22:48   Dg Chest Port 1 View  Result Date: 02/08/2019 CLINICAL DATA:  Cough, elevated D-dimer EXAM: PORTABLE CHEST 1 VIEW COMPARISON:  February 04, 2019 FINDINGS: There is cardiomegaly. Increased reticulonodular opacities are again noted throughout both lungs.  Calcified pleural plaques are again noted. A right-sided PICC catheter is seen with the tip just entering the upper SVC. No pneumothorax. No acute osseous abnormality. IMPRESSION: Unchanged reticulonodular opacities throughout both lungs, likely due to chronic lung changes, however cannot exclude superimposed infectious etiology. Electronically Signed   By: Prudencio Pair M.D.   On: 02/08/2019 22:28    Procedures Procedures (including critical care time)  Medications Ordered in ED Medications  ceFEPIme (MAXIPIME) 1 g in sodium chloride 0.9 % 100 mL IVPB (1 g Intravenous New Bag/Given 02/08/19 2233)    And  metroNIDAZOLE (FLAGYL) IVPB 500 mg (0 mg Intravenous Stopped 02/08/19 2234)     Initial Impression / Assessment and Plan / ED Course  I have reviewed the triage vital signs and the nursing notes.  Pertinent labs & imaging results that were available during my care of the patient were reviewed by me and considered in my medical decision making (see chart for details).        This patient appears generally in no distress, he is coughing occasionally and has oxygen levels that seem to be above 95% on our monitoring.  He does wear a couple liters of nasal cannula oxygen, this is likely related to his underlying asbestosis.  He was sent apparently for an elevated d-dimer which in this situation may not be very significant however given his elevated creatinine at over 2 he cannot have an angiogram.  He does have asymmetrical edema of the legs and will need to have DVT studies, likely a nuclear medicine study tomorrow.  Will obtain labs although it should be noted that hepatitis panel is pending, amylase was normal, blood counts showed a white blood cell count of 27,400, this could indicate infection, his sodium is 131, chronically low, creatinine is 2.3 which is an acute kidney injury and his AST was 1485.  Bilirubin is 0.5.  We will start antibiotics, CT scan without contrast of the abdomen and  pelvis to further evaluate for possible causes, will likely need to be admitted to the hospital to high level of care.  CT scan does not give a definite etiology of the patient's pain, his labs were reviewed showing multiple abnormalities including acute renal failure and liver failure of unknown etiology.  I discussed the case with the hospitalist will admit.  He has been given antibiotics, Dr. Olevia Bowens will admit  Curtis [name] was evaluated in Emergency Department on 02/08/2019 for the symptoms described in the history of present illness. He was evaluated in the context of the global COVID-19 pandemic, which necessitated consideration that the patient might be at risk for infection with the SARS-CoV-2 virus that causes COVID-19. Institutional protocols and algorithms that pertain to the evaluation of patients at risk for COVID-19 are in a state of rapid change based on information released by regulatory bodies including the CDC and federal and state organizations. These policies and algorithms were followed during the patient's care in the ED.   Final Clinical Impressions(s) / ED Diagnoses   Final diagnoses:  Acute hepatitis  Acute renal failure, unspecified acute renal failure type [facility])    ED Discharge Orders    None       Noemi Chapel,  MD 02/08/19 2325

## 2019-02-09 ENCOUNTER — Ambulatory Visit (HOSPITAL_COMMUNITY): Admit: 2019-02-09 | Payer: Medicare Other

## 2019-02-09 ENCOUNTER — Encounter (HOSPITAL_COMMUNITY): Payer: Self-pay

## 2019-02-09 ENCOUNTER — Inpatient Hospital Stay (HOSPITAL_COMMUNITY): Payer: Medicare Other

## 2019-02-09 LAB — COMPREHENSIVE METABOLIC PANEL
ALT: 801 U/L — ABNORMAL HIGH (ref 0–44)
AST: 651 U/L — ABNORMAL HIGH (ref 15–41)
Albumin: 2 g/dL — ABNORMAL LOW (ref 3.5–5.0)
Alkaline Phosphatase: 74 U/L (ref 38–126)
Anion gap: 10 (ref 5–15)
BUN: 37 mg/dL — ABNORMAL HIGH (ref 8–23)
CO2: 27 mmol/L (ref 22–32)
Calcium: 8.1 mg/dL — ABNORMAL LOW (ref 8.9–10.3)
Chloride: 96 mmol/L — ABNORMAL LOW (ref 98–111)
Creatinine, Ser: 2.14 mg/dL — ABNORMAL HIGH (ref 0.61–1.24)
GFR calc Af Amer: 34 mL/min — ABNORMAL LOW (ref >60)
GFR calc non Af Amer: 29 mL/min — ABNORMAL LOW (ref >60)
Glucose, Bld: 106 mg/dL — ABNORMAL HIGH (ref 70–99)
Potassium: 4 mmol/L (ref 3.5–5.1)
Sodium: 133 mmol/L — ABNORMAL LOW (ref 135–145)
Total Bilirubin: 0.5 mg/dL (ref 0.3–1.2)
Total Protein: 6.1 g/dL — ABNORMAL LOW (ref 6.5–8.1)

## 2019-02-09 LAB — URINALYSIS, ROUTINE W REFLEX MICROSCOPIC
Bilirubin Urine: NEGATIVE
Glucose, UA: NEGATIVE mg/dL
Ketones, ur: NEGATIVE mg/dL
Nitrite: NEGATIVE
Protein, ur: 100 mg/dL — AB
RBC / HPF: >50 RBC/hpf — ABNORMAL HIGH (ref 0–5)
Specific Gravity, Urine: 1.017 (ref 1.005–1.030)
WBC, UA: >50 WBC/hpf — ABNORMAL HIGH (ref 0–5)
pH: 5 (ref 5.0–8.0)

## 2019-02-09 LAB — CBC WITH DIFFERENTIAL/PLATELET
Abs Immature Granulocytes: 1.49 10*3/uL — ABNORMAL HIGH (ref 0.00–0.07)
Basophils Absolute: 0.1 10*3/uL (ref 0.0–0.1)
Basophils Relative: 1 %
Eosinophils Absolute: 0.2 10*3/uL (ref 0.0–0.5)
Eosinophils Relative: 1 %
HCT: 32.7 % — ABNORMAL LOW (ref 39.0–52.0)
Hemoglobin: 9.7 g/dL — ABNORMAL LOW (ref 13.0–17.0)
Immature Granulocytes: 9 %
Lymphocytes Relative: 6 %
Lymphs Abs: 1 10*3/uL (ref 0.7–4.0)
MCH: 28.2 pg (ref 26.0–34.0)
MCHC: 29.7 g/dL — ABNORMAL LOW (ref 30.0–36.0)
MCV: 95.1 fL (ref 80.0–100.0)
Monocytes Absolute: 0.8 10*3/uL (ref 0.1–1.0)
Monocytes Relative: 5 %
Neutro Abs: 12.6 10*3/uL — ABNORMAL HIGH (ref 1.7–7.7)
Neutrophils Relative %: 78 %
Platelets: 347 10*3/uL (ref 150–400)
RBC: 3.44 MIL/uL — ABNORMAL LOW (ref 4.22–5.81)
RDW: 18.3 % — ABNORMAL HIGH (ref 11.5–15.5)
WBC: 16.2 10*3/uL — ABNORMAL HIGH (ref 4.0–10.5)
nRBC: 0.4 % — ABNORMAL HIGH (ref 0.0–0.2)

## 2019-02-09 LAB — AMMONIA: Ammonia: 28 umol/L (ref 9–35)

## 2019-02-09 LAB — APTT: aPTT: 36 seconds (ref 24–36)

## 2019-02-09 LAB — PROTIME-INR
INR: 2.4 — ABNORMAL HIGH (ref 0.8–1.2)
Prothrombin Time: 25.7 seconds — ABNORMAL HIGH (ref 11.4–15.2)

## 2019-02-09 LAB — ACETAMINOPHEN LEVEL: Acetaminophen (Tylenol), Serum: <10 ug/mL — ABNORMAL LOW (ref 10–30)

## 2019-02-09 LAB — TSH: TSH: 2.14 u[IU]/mL (ref 0.350–4.500)

## 2019-02-09 LAB — SARS CORONAVIRUS 2 BY RT PCR (HOSPITAL ORDER, PERFORMED IN ~~LOC~~ HOSPITAL LAB): SARS Coronavirus 2: NEGATIVE

## 2019-02-09 LAB — LACTIC ACID, PLASMA: Lactic Acid, Venous: 1 mmol/L (ref 0.5–1.9)

## 2019-02-09 LAB — CK: Total CK: 91 U/L (ref 49–397)

## 2019-02-09 MED ORDER — ONDANSETRON HCL 4 MG PO TABS: 4.0000 mg | ORAL_TABLET | Freq: Four times a day (QID) | ORAL | Status: DC | PRN

## 2019-02-09 MED ORDER — SODIUM CHLORIDE 0.9 % IV SOLN
2.0000 g | Freq: Two times a day (BID) | INTRAVENOUS | Status: DC
  Administered 2019-02-09 – 2019-02-11 (×5): 2 g via INTRAVENOUS
  Filled 2019-02-09 (×5): qty 2

## 2019-02-09 MED ORDER — APIXABAN 5 MG PO TABS
5.0000 mg | ORAL_TABLET | Freq: Two times a day (BID) | ORAL | Status: DC
  Administered 2019-02-09 – 2019-02-14 (×11): 5 mg via ORAL
  Filled 2019-02-09 (×11): qty 1

## 2019-02-09 MED ORDER — CAMPHOR-MENTHOL 0.5-0.5 % EX LOTN: 1.0000 "application " | TOPICAL_LOTION | Freq: Two times a day (BID) | CUTANEOUS | Status: DC | PRN

## 2019-02-09 MED ORDER — METOPROLOL TARTRATE 25 MG PO TABS
25.0000 mg | ORAL_TABLET | Freq: Two times a day (BID) | ORAL | Status: DC
  Administered 2019-02-09 – 2019-02-14 (×11): 25 mg via ORAL
  Filled 2019-02-09 (×11): qty 1

## 2019-02-09 MED ORDER — LATANOPROST 0.005 % OP SOLN
1.0000 [drp] | Freq: Every day | OPHTHALMIC | Status: DC
  Administered 2019-02-09 – 2019-02-13 (×5): 1 [drp] via OPHTHALMIC
  Filled 2019-02-09: qty 2.5

## 2019-02-09 MED ORDER — VANCOMYCIN HCL IN DEXTROSE 1-5 GM/200ML-% IV SOLN
1000.0000 mg | Freq: Once | INTRAVENOUS | Status: AC
  Administered 2019-02-09: 1000 mg via INTRAVENOUS
  Filled 2019-02-09: qty 200

## 2019-02-09 MED ORDER — SODIUM CHLORIDE 0.9 % IV BOLUS (SEPSIS)
2000.0000 mL | Freq: Once | INTRAVENOUS | Status: AC
  Administered 2019-02-09: 2000 mL via INTRAVENOUS

## 2019-02-09 MED ORDER — FINASTERIDE 5 MG PO TABS
5.0000 mg | ORAL_TABLET | ORAL | Status: DC
  Administered 2019-02-10 – 2019-02-14 (×5): 5 mg via ORAL
  Filled 2019-02-09 (×6): qty 1

## 2019-02-09 MED ORDER — ACETAMINOPHEN 325 MG PO TABS
650.0000 mg | ORAL_TABLET | Freq: Four times a day (QID) | ORAL | Status: DC | PRN
  Administered 2019-02-11 – 2019-02-12 (×3): 650 mg via ORAL
  Filled 2019-02-09 (×3): qty 2

## 2019-02-09 MED ORDER — PRO-STAT SUGAR FREE PO LIQD
30.0000 mL | Freq: Two times a day (BID) | ORAL | Status: DC
  Administered 2019-02-09 – 2019-02-13 (×7): 30 mL via ORAL
  Filled 2019-02-09 (×10): qty 30

## 2019-02-09 MED ORDER — SODIUM CHLORIDE 0.9 % IV SOLN
500.0000 mg | Freq: Every day | INTRAVENOUS | Status: DC
  Administered 2019-02-09 – 2019-02-10 (×3): 500 mg via INTRAVENOUS
  Filled 2019-02-09 (×3): qty 500

## 2019-02-09 MED ORDER — ONDANSETRON HCL 4 MG/2ML IJ SOLN
4.0000 mg | Freq: Four times a day (QID) | INTRAMUSCULAR | Status: DC | PRN
  Administered 2019-02-11: 4 mg via INTRAVENOUS
  Filled 2019-02-09 (×2): qty 2

## 2019-02-09 MED ORDER — IPRATROPIUM-ALBUTEROL 0.5-2.5 (3) MG/3ML IN SOLN
3.0000 mL | RESPIRATORY_TRACT | Status: DC | PRN
  Administered 2019-02-12 (×2): 3 mL via RESPIRATORY_TRACT
  Filled 2019-02-09 (×2): qty 3

## 2019-02-09 MED ORDER — BISACODYL 5 MG PO TBEC: 5.0000 mg | DELAYED_RELEASE_TABLET | Freq: Every day | ORAL | Status: DC | PRN

## 2019-02-09 MED ORDER — TAMSULOSIN HCL 0.4 MG PO CAPS
0.4000 mg | ORAL_CAPSULE | Freq: Every day | ORAL | Status: DC
  Administered 2019-02-09 – 2019-02-13 (×5): 0.4 mg via ORAL
  Filled 2019-02-09 (×5): qty 1

## 2019-02-09 MED ORDER — VANCOMYCIN HCL IN DEXTROSE 1-5 GM/200ML-% IV SOLN
1000.0000 mg | INTRAVENOUS | Status: DC
  Administered 2019-02-10: 1000 mg via INTRAVENOUS
  Filled 2019-02-09: qty 200

## 2019-02-09 MED ORDER — HYDROCORTISONE 1 % EX CREA
TOPICAL_CREAM | Freq: Two times a day (BID) | CUTANEOUS | Status: DC
  Administered 2019-02-09 – 2019-02-14 (×10): via TOPICAL
  Filled 2019-02-09: qty 28

## 2019-02-09 MED ORDER — CHLORHEXIDINE GLUCONATE CLOTH 2 % EX PADS
6.0000 | MEDICATED_PAD | Freq: Every day | CUTANEOUS | Status: DC
  Administered 2019-02-09 – 2019-02-14 (×7): 6 via TOPICAL

## 2019-02-09 MED ORDER — MOMETASONE FURO-FORMOTEROL FUM 200-5 MCG/ACT IN AERO
2.0000 | INHALATION_SPRAY | Freq: Two times a day (BID) | RESPIRATORY_TRACT | Status: DC
  Administered 2019-02-09 – 2019-02-14 (×10): 2 via RESPIRATORY_TRACT
  Filled 2019-02-09: qty 8.8

## 2019-02-09 MED ORDER — SODIUM CHLORIDE 0.9 % IV SOLN
INTRAVENOUS | Status: AC
  Administered 2019-02-09 (×2): via INTRAVENOUS

## 2019-02-09 MED ORDER — ACETAMINOPHEN 650 MG RE SUPP: 650.0000 mg | Freq: Four times a day (QID) | RECTAL | Status: DC | PRN

## 2019-02-09 NOTE — Progress Notes (Deleted)
Cardiology Office Note    Date:  02/09/2019   ID:  Curtis [name], DOB [birth year], MRN [id number]  PCP:  [name], MD  Cardiologist: Kate Sable, MD EPS: None  No chief complaint on file.   History of Present Illness:  Curtis [name] is a 73 y.o. 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 Started on eliquis and metoprolol with plans for DCCV in 3-4 weeks if doesn't convert. Diuresed over 8L. Echo normal LVEF 55-60%  Readmitted 02/09/19 with acute encephalopathy elevated d-dimer and transaminitis.   Past Medical History:  Diagnosis Date  . AAA (abdominal aortic aneurysm), stable 04/20/2014  . Arthritis   . 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 CAD (coronary artery disease)    a. s/p cath in 2015 showing occluded RCA and high-grade LCx stenosis treated with DESx2 to LCx  . Cancer ([place])   . Depression   . Diverticulitis    hospital 2011 [facility]  . Diverticulosis   . GERD (gastroesophageal reflux disease)   . 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 Hernia of unspecified site of abdominal cavity without mention of obstruction or gangrene    hiatal  . Hyperlipidemia LDL goal <70 04/20/2014  . Hypertension   . IBS (irritable bowel syndrome)   . Myocardial infarction ([place]) 01/2014   NSTEMI  . Peripheral vascular disease (HCC)    ankle brachial index of 0.79 on the right and 0.61 on the left.   . Pneumonia   . 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[place])   . Reflex sympathetic dystrophy   . 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 SOB (shortness of breath)     Past Surgical History:  Procedure Laterality Date  . CARDIAC CATHETERIZATION    . CATARACT EXTRACTION    . CORONARY ANGIOPLASTY    . HERNIA REPAIR     . LEFT HEART CATHETERIZATION WITH CORONARY ANGIOGRAM N/A 01/17/2014   Procedure: LEFT HEART CATHETERIZATION WITH CORONARY ANGIOGRAM;  Surgeon: Leonie Man, MD;  Location: [facility] CATH LAB;  Service: Cardiovascular;  Laterality: N/A;  . left shoulder      x 3  . NISSEN FUNDOPLICATION    . right elbow surgery     x 2  . right knee arthroscopy    . squamous cell skin cancer     Left Hand  . SUPRAVENTRICULAR TACHYCARDIA ABLATION N/A 01/16/2014   Procedure: SUPRAVENTRICULAR TACHYCARDIA ABLATION;  Surgeon: Evans Lance, MD;  Location: [facility] CATH LAB;  Service: Cardiovascular;  Laterality: N/A;    Current Medications: No outpatient medications have been marked as taking for the 02/14/19 encounter (Appointment) with Imogene Burn, PA-C.     Allergies:   Codeine and Trileptal [oxcarbazepine]   Social History   Socioeconomic History  . Marital status: Widowed    Spouse name: Not on file  . Number of children: 2  . Years of education: 55  . Highest education level: Not on file  Occupational History  . Occupation: [employer]  . Financial resource strain: Not on file  . Food insecurity    Worry: Not on file    Inability: Not on file  . Transportation needs    Medical:  Not on file    Non-medical: Not on file  Tobacco Use  . Smoking status: Former Smoker    Packs/day: 2.50    Years: 50.00    Pack years: 125.00    Types: Cigarettes    Quit date: 05/05/2005    Years since quitting: 13.7  . Smokeless tobacco: Former Systems developer    Quit date: 05/03/2012  Substance and Sexual Activity  . Alcohol use: No    Alcohol/week: 0.0 standard drinks    Comment: last use 2013  . Drug use: No  . Sexual activity: Not on file    Comment: widowed, Veteran  Lifestyle  . Physical activity    Days per week: Not on file    Minutes per session: Not on file  . Stress: Not on file  Relationships  . Social Herbalist on phone: Not on file    Gets together: Not on file    Attends  religious service: Not on file    Active member of club or organization: Not on file    Attends meetings of clubs or organizations: Not on file    Relationship status: Not on file  Other Topics Concern  . Not on file  Social History Narrative   Patient drinks 3 cups of caffeine daily.   Patient is right handed.     Family History:  The patient's ***family history includes Colon cancer in his maternal aunt, maternal uncle, and mother.   ROS:   Please see the history of present illness.    ROS All other systems reviewed and are negative.   PHYSICAL EXAM:   VS:  There were no vitals taken for this visit.  Physical Exam  GEN: Well nourished, well developed, in no acute distress  HEENT: normal  Neck: no JVD, carotid bruits, or masses Cardiac:RRR; no murmurs, rubs, or gallops  Respiratory:  clear to auscultation bilaterally, normal work of breathing GI: soft, nontender, nondistended, + BS Ext: without cyanosis, clubbing, or edema, Good distal pulses bilaterally MS: no deformity or atrophy  Skin: warm and dry, no rash Neuro:  Alert and Oriented x 3, Strength and sensation are intact Psych: euthymic mood, full affect  Wt Readings from Last 3 Encounters:  02/08/19 277 lb 6.4 oz (125.8 kg)  02/08/19 202 lb 3.2 oz (91.7 kg)  02/04/19 196 lb 3.2 oz (89 kg)      Studies/Labs Reviewed:   EKG:  EKG is*** ordered today.  The ekg ordered today demonstrates ***  Recent Labs: 01/01/2019: B Natriuretic Peptide 885.0 01/02/2019: TSH 0.435 01/05/2019: Magnesium 1.9 02/09/2019: ALT 801; BUN 37; Creatinine, Ser 2.14; Hemoglobin 9.7; Platelets 347; Potassium 4.0; Sodium 133   Lipid Panel    Component Value Date/Time   CHOL 102 04/18/2014 0306   TRIG 222 (H) 04/18/2014 0306   HDL 24 (L) 04/18/2014 0306   CHOLHDL 4.3 04/18/2014 0306   VLDL 44 (H) 04/18/2014 0306   LDLCALC 34 04/18/2014 0306   LDLDIRECT 43.7 04/03/2014 1020    Additional studies/ records that were reviewed today include:   ***    ASSESSMENT:    No diagnosis found.   PLAN:  In order of problems listed above:      Medication Adjustments/Labs and Tests Ordered: Current medicines are reviewed at length with the patient today.  Concerns regarding medicines are outlined above.  Medication changes, Labs and Tests ordered today are listed in the Patient Instructions below. There are no Patient Instructions on file for this  visit.   Signed, Ermalinda Barrios, PA-C  02/09/2019 3:11 PM    [facility] [address], [place], [place]  [address] Phone: [phone]; Fax: [phone]

## 2019-02-09 NOTE — Progress Notes (Addendum)
Pharmacy Antibiotic Note   Curtis [name] is a 73 y.o. male admitted on 02/08/2019 with PNA. Pharmacy has been consulted for vancomycin and cefepime  dosing.  Plan:  Vancomycin 2000 mg IV x1, then vancomycin 1000 mg IV q24h.  Cefepime 2 gr IV q12h  Azithromycin 500 mg IV q24 ( MD )   Monitor clinical course, renal function, cultures as available   Weight: 277 lb 6.4 oz (125.8 kg)  Temp (24hrs), Avg:97.8 F (36.6 C), Min:97.5 F (36.4 C), Max:98.1 F (36.7 C)  Recent Labs  Lab 02/04/19 1300 02/08/19 1300 02/08/19 2129  WBC 18.0* 27.4*  --   CREATININE  --  2.31*  --   LATICACIDVEN  --   --  2.5*    Estimated Creatinine Clearance: 38.5 mL/min (A) (by C-G formula based on SCr of 2.31 mg/dL (H)).    Allergies  Allergen Reactions  . Codeine Itching  . Trileptal [Oxcarbazepine]     Other reaction(s): Other (See Comments) Abnormal sodium levels Hyponatremia    Antimicrobials this admission: 10/6 metronidazole x 1 10/6 cefepime >>  10/7 vancomycin >>  10/7 azithromycin >>   Dose adjustments this admission:    Microbiology results: 10/6 COVID-19: negative  10/6 Hepatitis panel: negative  10/6 C.diff PCR: negative  UCx:  Ordered    Thank you for allowing pharmacy to be a part of this patient's care.   Royetta Asal, PharmD, BCPS 02/09/2019 2:59 AM

## 2019-02-09 NOTE — Progress Notes (Addendum)
PROGRESS NOTE    Curtis [name]  [id number] DOB: 09-20-1945 DOA: 02/08/2019 PCP: [name], MD   Brief Narrative:  Per HPI: Curtis [name] is a 73 y.o. male with medical history significant of AAA, osteoarthritis, asbestosis, CAD, history of an STEMI, cancer, depression, diverticulosis,/diabetic colitis, GERD, glaucoma, abdominal hernia, hyperlipidemia, hypertension, IBS, peripheral vascular disease, history of pneumonia, reflex sympathetic dystrophy, stage III and IV decubiti ulcers who is brought to the emergency department due to elevated d-dimer and decreased mentation.  The patient has been getting IV antibiotics at the facility for pneumonia.  He is unable to provide further information at this time.  10/7: Patient has been admitted with acute encephalopathy as well as elevated d-dimer and transaminitis with further studies currently pending.  Assessment & Plan:   Principal Problem:   Acute hepatitis Active Problems:   GERD   AKI (acute kidney injury) ([place])   HTN (hypertension)   SVT (supraventricular tachycardia) s/p ablation 01/16/2014   HCAP (healthcare-associated pneumonia)   Pressure ulcer of right heel, stage 2 (HCC)   Acute metabolic encephalopathy likely secondary to polypharmacy in the setting of AKI -We will check urine analysis as well as TSH and ammonia to complete work-up and consider CT head as needed -Continue to monitor closely -Hold home Xanax, gabapentin, and Elavil for now  Elevated d-dimer -I do not suspect PE at this time and patient has been on Eliquis -We will consider CT angiogram for thorough evaluation once AKI resolved -No associated signs or symptoms or increased oxygen needs noted  Acute kidney injury-improving -Continue IV fluids as ordered -Urine output not recorded and will need to be monitored closely -Avoid nephrotoxic agents -Repeat renal panel in a.m. -Check CK level  Transaminitis-resolving -Right upper quadrant  ultrasound with gallbladder distention no cholelithiasis noted-no other acute findings -Monitor CMP in a.m. -No abdominal pain or other signs of jaundice noted  HCAP -Continue on current antibiotics and monitor MRSA PCR -Monitor microbiology -Continue supplemental oxygen as well as duo nebs as needed  GERD -Continue famotidine  Hypertension -Continue metoprolol 25mg  BID  SVT status post ablation 01/2014 -Continue apixaban and metoprolol  Pressure ulcer of bilateral heels stage II -Continue local wound care  DVT prophylaxis: Apixaban Code Status: Partial code-no mechanical ventilation Family Communication: We will plan to contact family Disposition Plan: Continue to monitor encephalopathy maintain on IV fluid and repeat labs.  Discharge back to [facility] once stable.   Consultants:   None  Procedures:   None  Antimicrobials:  Anti-infectives (From admission, onward)   Start     Dose/Rate Route Frequency Ordered Stop   02/10/19 0600  vancomycin (VANCOCIN) IVPB 1000 mg/200 mL premix     1,000 mg 200 mL/hr over 60 Minutes Intravenous Every 24 hours 02/09/19 0304     02/09/19 0600  ceFEPIme (MAXIPIME) 2 g in sodium chloride 0.9 % 100 mL IVPB     2 g 200 mL/hr over 30 Minutes Intravenous Every 12 hours 02/09/19 0304     02/09/19 0415  vancomycin (VANCOCIN) IVPB 1000 mg/200 mL premix     1,000 mg 200 mL/hr over 60 Minutes Intravenous  Once 02/09/19 0304 02/09/19 0633   02/09/19 0315  vancomycin (VANCOCIN) IVPB 1000 mg/200 mL premix     1,000 mg 200 mL/hr over 60 Minutes Intravenous  Once 02/09/19 0304 02/09/19 0510   02/09/19 0230  azithromycin (ZITHROMAX) 500 mg in sodium chloride 0.9 % 250 mL IVPB     500 mg  250 mL/hr over 60 Minutes Intravenous Daily at bedtime 02/09/19 0218     02/08/19 2115  ceFEPIme (MAXIPIME) 1 g in sodium chloride 0.9 % 100 mL IVPB     1 g 200 mL/hr over 30 Minutes Intravenous  Once 02/08/19 2112 02/08/19 2330   02/08/19 2115  metroNIDAZOLE  (FLAGYL) IVPB 500 mg     500 mg 100 mL/hr over 60 Minutes Intravenous  Once 02/08/19 2112 02/08/19 2234       Subjective: Patient seen and evaluated today with no new acute complaints or concerns. No acute concerns or events noted overnight.  He appears confused and cannot give any meaningful history at this time.  Remains on 4 L nasal cannula oxygen.  Objective: Vitals:   02/09/19 0730 02/09/19 0800 02/09/19 0913 02/09/19 1301  BP: 104/61 (!) 100/59 113/68 127/62  Pulse: 80 94 79 97  Resp: 12 14 18 19   Temp:   98 F (36.7 C) 97.6 F (36.4 C)  TempSrc:   Oral Oral  SpO2: 100% 100% 90%   Weight:        Intake/Output Summary (Last 24 hours) at 02/09/2019 1317 Last data filed at 02/09/2019 0745 Gross per 24 hour  Intake 2850 ml  Output --  Net 2850 ml   Filed Weights   02/08/19 2055  Weight: 125.8 kg    Examination:  General exam: Appears calm and comfortable, confused Respiratory system: Clear to auscultation. Respiratory effort normal.  Currently with 4 L nasal cannula oxygen Cardiovascular system: S1 & S2 heard, RRR. No JVD, murmurs, rubs, gallops or clicks. No pedal edema. Gastrointestinal system: Abdomen is nondistended, soft and nontender. No organomegaly or masses felt. Normal bowel sounds heard. Central nervous system: Alert and confused Extremities: Symmetric 5 x 5 power. Skin: Bilateral heels with stage II ulcers Psychiatry: Flat affect    Data Reviewed: I have personally reviewed following labs and imaging studies  CBC: Recent Labs  Lab 02/04/19 1300 02/08/19 1300 02/09/19 0930  WBC 18.0* 27.4* 16.2*  NEUTROABS 14.1*  --  12.6*  HGB 11.0* 10.4* 9.7*  HCT 36.3* 34.0* 32.7*  MCV 92.4 93.7 95.1  PLT 545* 478* [id number]   Basic Metabolic Panel: Recent Labs  Lab 02/08/19 1300 02/09/19 0930  NA 131* 133*  K 4.8 4.0  CL 92* 96*  CO2 27 27  GLUCOSE 201* 106*  BUN 36* 37*  CREATININE 2.31* 2.14*  CALCIUM 8.7* 8.1*   GFR: Estimated Creatinine  Clearance: 41.5 mL/min (A) (by C-G formula based on SCr of 2.14 mg/dL (H)). Liver Function Tests: Recent Labs  Lab 02/08/19 1300 02/09/19 0930  AST 1,485* 651*  ALT 846* 801*  ALKPHOS 88 74  BILITOT 0.5 0.5  PROT 6.3* 6.1*  ALBUMIN 2.1* 2.0*   Recent Labs  Lab 02/08/19 1545  LIPASE 22  AMYLASE 16*   No results for input(s): AMMONIA in the last 168 hours. Coagulation Profile: Recent Labs  Lab 02/09/19 0930  INR 2.4*   Cardiac Enzymes: No results for input(s): CKTOTAL, CKMB, CKMBINDEX, TROPONINI in the last 168 hours. BNP (last 3 results) No results for input(s): PROBNP in the last 8760 hours. HbA1C: No results for input(s): HGBA1C in the last 72 hours. CBG: No results for input(s): GLUCAP in the last 168 hours. Lipid Profile: No results for input(s): CHOL, HDL, LDLCALC, TRIG, CHOLHDL, LDLDIRECT in the last 72 hours. Thyroid Function Tests: No results for input(s): TSH, T4TOTAL, FREET4, T3FREE, THYROIDAB in the last 72 hours. Anemia Panel: Recent Labs  02/08/19 1545  FERRITIN 1,701*   Sepsis Labs: Recent Labs  Lab 02/08/19 2129 02/09/19 0930  LATICACIDVEN 2.5* 1.0    Recent Results (from the past 240 hour(s))  C difficile quick scan w PCR reflex     Status: None   Collection Time: 02/08/19  9:41 AM   Specimen: STOOL  Result Value Ref Range Status   C Diff antigen NEGATIVE NEGATIVE Final   C Diff toxin NEGATIVE NEGATIVE Final   C Diff interpretation No C. difficile detected.  Final    Comment: Performed at [facility], [address] [place], [place] [address]  SARS Coronavirus 2 [facility] order, Performed in [facility] hospital lab) Nasopharyngeal Nasopharyngeal Swab     Status: None   Collection Time: 02/08/19 11:33 PM   Specimen: Nasopharyngeal Swab  Result Value Ref Range Status   SARS Coronavirus 2 NEGATIVE NEGATIVE Final    Comment: (NOTE) If result is NEGATIVE SARS-CoV-2 target nucleic acids are NOT DETECTED. The SARS-CoV-2 RNA is generally  detectable in upper and lower  respiratory specimens during the acute phase of infection. The lowest  concentration of SARS-CoV-2 viral copies this assay can detect is 250  copies / mL. A negative result does not preclude SARS-CoV-2 infection  and should not be used as the sole basis for treatment or other  patient management decisions.  A negative result may occur with  improper specimen collection / handling, submission of specimen other  than nasopharyngeal swab, presence of viral mutation(s) within the  areas targeted by this assay, and inadequate number of viral copies  (<250 copies / mL). A negative result must be combined with clinical  observations, patient history, and epidemiological information. If result is POSITIVE SARS-CoV-2 target nucleic acids are DETECTED. The SARS-CoV-2 RNA is generally detectable in upper and lower  respiratory specimens dur ing the acute phase of infection.  Positive  results are indicative of active infection with SARS-CoV-2.  Clinical  correlation with patient history and other diagnostic information is  necessary to determine patient infection status.  Positive results do  not rule out bacterial infection or co-infection with other viruses. If result is PRESUMPTIVE POSTIVE SARS-CoV-2 nucleic acids MAY BE PRESENT.   A presumptive positive result was obtained on the submitted specimen  and confirmed on repeat testing.  While 2019 novel coronavirus  (SARS-CoV-2) nucleic acids may be present in the submitted sample  additional confirmatory testing may be necessary for epidemiological  and / or clinical management purposes  to differentiate between  SARS-CoV-2 and other Sarbecovirus currently known to infect humans.  If clinically indicated additional testing with an alternate test  methodology [phone]) is advised. The SARS-CoV-2 RNA is generally  detectable in upper and lower respiratory sp ecimens during the acute  phase of infection. The  expected result is Negative. Fact Sheet for Patients:  StrictlyIdeas.no Fact Sheet for Healthcare Providers: BankingDealers.co.za This test is not yet approved or cleared by the [place] FDA and has been authorized for detection and/or diagnosis of SARS-CoV-2 by FDA under an Emergency Use Authorization (EUA).  This EUA will remain in effect (meaning this test can be used) for the duration of the COVID-19 declaration under Section 564(b)(1) of the Act, 21 U.S.C. section 360bbb-3(b)(1), unless the authorization is terminated or revoked sooner. Performed at [facility], [address] [place], [place] [address]          Radiology Studies: Ct Abdomen Pelvis Wo Contrast  Result Date: 02/08/2019 CLINICAL DATA:  Nausea, vomiting,  and diarrhea. Abnormal LFTs. Acute renal failure. EXAM: CT ABDOMEN AND PELVIS WITHOUT CONTRAST TECHNIQUE: Multidetector CT imaging of the abdomen and pelvis was performed following the standard protocol without IV contrast. COMPARISON:  CT abdomen pelvis dated November 10, 2014. FINDINGS: Lower chest: No acute abnormality. Cardiomegaly. Chronic atelectasis and scarring in the lung basis with multiple calcified pleural plaques. Hepatobiliary: No focal liver abnormality is seen. No gallstones, gallbladder wall thickening, or biliary dilatation. Pancreas: Unremarkable. No pancreatic ductal dilatation or surrounding inflammatory changes. Spleen: Normal in size without focal abnormality. Adrenals/Urinary Tract: The adrenal glands are unremarkable. 1.9 cm right renal simple cyst, mildly enlarged since 2016. No renal calculi or hydronephrosis. The bladder is decompressed by Foley catheter. Stomach/Bowel: Prior Nissen fundoplication. Stomach is otherwise within normal limits. No bowel wall thickening, distention, or surrounding inflammatory changes. Mild left-sided colonic diverticulosis. Normal appendix. Vascular/Lymphatic: Extensive  aortoiliac atherosclerosis with unchanged 3.2 cm infrarenal abdominal aortic aneurysm. No enlarged abdominal or pelvic lymph nodes. Reproductive: Prostate is unremarkable. Other: New small ascites.  No pneumoperitoneum. Musculoskeletal: No acute or significant osseous findings. IMPRESSION: 1.  No acute intra-abdominal process. 2. New small ascites. 3. Unchanged 3.2 cm infrarenal abdominal aortic aneurysm. Recommend followup by ultrasound in 3 years. This recommendation follows ACR consensus guidelines: White Paper of the ACR Incidental Findings Committee II on Vascular Findings. J Am Coll Radiol 2013; 10:789-794. Aortic aneurysm NOS (ICD10-I71.9) 4.  Aortic atherosclerosis (ICD10-I70.0). Electronically Signed   By: Titus Dubin M.D.   On: 02/08/2019 22:48   Dg Chest Port 1 View  Result Date: 02/08/2019 CLINICAL DATA:  Cough, elevated D-dimer EXAM: PORTABLE CHEST 1 VIEW COMPARISON:  February 04, 2019 FINDINGS: There is cardiomegaly. Increased reticulonodular opacities are again noted throughout both lungs. Calcified pleural plaques are again noted. A right-sided PICC catheter is seen with the tip just entering the upper SVC. No pneumothorax. No acute osseous abnormality. IMPRESSION: Unchanged reticulonodular opacities throughout both lungs, likely due to chronic lung changes, however cannot exclude superimposed infectious etiology. Electronically Signed   By: Prudencio Pair M.D.   On: 02/08/2019 22:28   US Abdomen Limited Ruq  Result Date: 02/09/2019 CLINICAL DATA:  Acute hepatitis. EXAM: ULTRASOUND ABDOMEN LIMITED RIGHT UPPER QUADRANT COMPARISON:  CT scan of February 08, 2019. FINDINGS: Gallbladder: Gallbladder distention is noted without cholelithiasis or gallbladder wall thickening. No pericholecystic fluid is noted. The absence or presence of sonographic Murphy's sign was not reported by the technologist. Common bile duct: Diameter: 4 mm which is within normal limits. Liver: No focal lesion identified.  Within normal limits in parenchymal echogenicity. Portal vein is patent on color Doppler imaging with normal direction of blood flow towards the liver. Other: None. IMPRESSION: Gallbladder distention is noted without cholelithiasis. No gallbladder wall thickening or pericholecystic fluid is noted. No other abnormality seen in the right upper quadrant of the abdomen. Electronically Signed   By: Marijo Conception M.D.   On: 02/09/2019 08:59        Scheduled Meds:  Chlorhexidine Gluconate Cloth  6 each Topical Daily   Continuous Infusions:  sodium chloride 100 mL/hr at 02/09/19 0937   azithromycin Stopped (02/09/19 0403)   ceFEPime (MAXIPIME) IV Stopped (02/09/19 0745)   [START ON 02/10/2019] vancomycin       LOS: 1 day    Time spent: 30 minutes    Zacharey Jensen Darleen Crocker, DO [facility] Pager [phone]  If 7PM-7AM, please contact night-coverage [email or web address] Password [email or web address] 02/09/2019, 1:17 PM

## 2019-02-09 NOTE — TOC Initial Note (Signed)
Transition of Care [facility]) - Initial/Assessment Note    Patient Details  Name: Curtis [name] MRN: [id number] Date of Birth: 1946-03-15  Transition of Care [facility] [name]) CM/SW Contact:    Trish Mage, LCSW Phone Number: 02/09/2019, 11:56 AM  Clinical Narrative:  Pt from [facility] with pneumonia, acute hepatitis and AKI, where he landed after d/c from here one month ago.  Was doing well with rehab until late last  week when began running a fever and became significantly weaker.  Plan is for patient to return to [facility] upon d/c to complete his rehab process before returning home.  TOC will continue to follow during the course of hospitalization.            Expected Discharge Plan: Skilled Nursing Facility Barriers to Discharge: No Barriers Identified   Patient Goals and CMS Choice        Expected Discharge Plan and Services Expected Discharge Plan: [place]   Discharge Planning Services: CM Consult   Living arrangements for the past 2 months: [place]                                      Prior Living Arrangements/Services Living arrangements for the past 2 months: [place] Lives with:: Facility Resident Patient language and need for interpreter reviewed:: Yes Do you feel safe going back to the place where you live?: Yes      Need for Family Participation in Patient Care: Yes (Comment) Care giver support system in place?: Yes (comment)   Criminal Activity/Legal Involvement Pertinent to Current Situation/Hospitalization: No - Comment as needed  Activities of Daily Living Home Assistive Devices/Equipment: None ADL Screening (condition at time of admission) Patient's cognitive ability adequate to safely complete daily activities?: Yes Is the patient deaf or have difficulty hearing?: No Does the patient have difficulty seeing, even when wearing glasses/contacts?: No Does the patient have difficulty concentrating, remembering, or  making decisions?: No Patient able to express need for assistance with ADLs?: Yes Does the patient have difficulty dressing or bathing?: No Independently performs ADLs?: Yes (appropriate for developmental age) Does the patient have difficulty walking or climbing stairs?: No Weakness of Legs: None Weakness of Arms/Hands: None  Permission Sought/Granted                  Emotional Assessment Appearance:: Appears younger than stated age Attitude/Demeanor/Rapport: Unable to Assess Affect (typically observed): Unable to Assess Orientation: : Oriented to Self, Oriented to Place, Oriented to  Time, Oriented to Situation Alcohol / Substance Use: Not Applicable Psych Involvement: No (comment)  Admission diagnosis:  Acute hepatitis [B17.9] Acute renal failure, unspecified acute renal failure type [facility]) [N17.9] Patient Active Problem List   Diagnosis Date Noted  . D-dimer, elevated 02/08/2019  . Elevated liver enzymes 02/08/2019  . Nausea vomiting and diarrhea 02/08/2019  . Acute hepatitis 02/08/2019  . Physical deconditioning 02/04/2019  . Pressure injury of deep tissue of left heel 01/27/2019  . Atrial flutter ([place]) 01/15/2019  . Hypokalemia 01/15/2019  . Bilateral open angle glaucoma 01/15/2019  . Anxiety disorder 01/15/2019  . Acute diastolic CHF (congestive heart failure) ([place]) 01/03/2019  . Pressure ulcer of right heel, stage 2 ([place]) 01/03/2019  . COPD exacerbation ([place]) 01/02/2019  . Acute on chronic respiratory failure with hypoxia and hypercapnia ([place]) 01/02/2019  . Acute CHF (congestive heart failure) ([place]) 01/02/2019  . Hyperkalemia 01/02/2019  .  Sepsis ([place]) 12/15/2018  . Situational mixed anxiety and depressive disorder 02/28/2018  . Carotid artery disease ([place]) 02/27/2016  . Acute hyponatremia 02/12/2015  . Hereditary and idiopathic peripheral neuropathy 01/30/2015  . Acute on chronic respiratory failure ([place]) 05/08/2014  . Anemia, iron deficiency 05/08/2014  .  Hyponatremia 05/08/2014  . Urinary retention 05/08/2014  . Chronic respiratory failure with hypoxia ([employer]) 04/30/2014  . Shortness of breath   . HCAP (healthcare-associated pneumonia) 04/24/2014  . Hyperlipidemia LDL goal <70 04/20/2014  . AAA (abdominal aortic aneurysm), stable 04/20/2014  . Abdominal pain, generalized   . SOB (shortness of breath)   . Peripheral vascular disease ([place])   . Presence of drug coated stent in left circumflex coronary artery: Promus DES 3.5 mm x 38 mm (3.9 mm) 01/17/2014    Class: Acute  . CAD (coronary artery disease), native coronary artery   . SVT (supraventricular tachycardia) s/p ablation 01/16/2014 01/16/2014  . 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  . TIA (transient ischemic attack) 09/06/2013  . HTN (hypertension) 09/05/2013  . AKI (acute kidney injury) ([place]) 11/01/2012  . TOBACCO ABUSE, hx 08/23/2007  . COPD with chronic bronchitis ([place]) 08/12/2007  . Asbestosis ([place]) 08/12/2007  . Reflex sympathetic dystrophy 05/29/2007  . GERD 05/29/2007   PCP:  [name], MD Pharmacy:   [facility] [place], [place] [address] [address] [place] [address] Phone: [phone] Fax: [phone]  [id number] - [place], [place] - [address] [place]  [address] Phone: [phone] Fax: [phone]  [facility] - [place], [place] - [address] [place] [address] Phone: [phone] Fax: [phone]     Social Determinants of Health ([place]) Interventions    Readmission Risk Interventions Readmission Risk Prevention Plan 01/04/2019 12/19/2018  Transportation Screening Complete Complete  PCP or Specialist Appt within 5-7 Days - Complete  PCP or Specialist Appt within 3-5 Days Complete -  Home Care Screening - Complete  Medication Review (RN CM) - Complete  HRI or Home Care  Consult Complete -  Social Work Consult for [place] Planning/Counseling Complete -  Palliative Care Screening Not Applicable -  Medication Review Press photographer) Complete -  Some recent data might be hidden

## 2019-02-09 NOTE — Progress Notes (Signed)
[facility] to verify with staff that patient's cell phone and glasses were still in his room (#102). Staff member verified that items were still in his room. Stated they would make arrangements to have both cell phone and glasses delivered here to patient's hospital room #310 tomorrow morning. Pt aware and agreeable.

## 2019-02-10 ENCOUNTER — Ambulatory Visit (HOSPITAL_COMMUNITY): Payer: Medicare Other

## 2019-02-10 LAB — COMPREHENSIVE METABOLIC PANEL
ALT: 515 U/L — ABNORMAL HIGH (ref 0–44)
AST: 209 U/L — ABNORMAL HIGH (ref 15–41)
Albumin: 1.9 g/dL — ABNORMAL LOW (ref 3.5–5.0)
Alkaline Phosphatase: 74 U/L (ref 38–126)
Anion gap: 8 (ref 5–15)
BUN: 34 mg/dL — ABNORMAL HIGH (ref 8–23)
CO2: 27 mmol/L (ref 22–32)
Calcium: 8.2 mg/dL — ABNORMAL LOW (ref 8.9–10.3)
Chloride: 100 mmol/L (ref 98–111)
Creatinine, Ser: 1.72 mg/dL — ABNORMAL HIGH (ref 0.61–1.24)
GFR calc Af Amer: 44 mL/min — ABNORMAL LOW (ref >60)
GFR calc non Af Amer: 38 mL/min — ABNORMAL LOW (ref >60)
Glucose, Bld: 90 mg/dL (ref 70–99)
Potassium: 3.9 mmol/L (ref 3.5–5.1)
Sodium: 135 mmol/L (ref 135–145)
Total Bilirubin: 0.3 mg/dL (ref 0.3–1.2)
Total Protein: 5.9 g/dL — ABNORMAL LOW (ref 6.5–8.1)

## 2019-02-10 LAB — CBC
HCT: 30.1 % — ABNORMAL LOW (ref 39.0–52.0)
Hemoglobin: 9 g/dL — ABNORMAL LOW (ref 13.0–17.0)
MCH: 28.4 pg (ref 26.0–34.0)
MCHC: 29.9 g/dL — ABNORMAL LOW (ref 30.0–36.0)
MCV: 95 fL (ref 80.0–100.0)
Platelets: 329 10*3/uL (ref 150–400)
RBC: 3.17 MIL/uL — ABNORMAL LOW (ref 4.22–5.81)
RDW: 18.5 % — ABNORMAL HIGH (ref 11.5–15.5)
WBC: 16 10*3/uL — ABNORMAL HIGH (ref 4.0–10.5)
nRBC: 0.6 % — ABNORMAL HIGH (ref 0.0–0.2)

## 2019-02-10 LAB — GLUCOSE, CAPILLARY: Glucose-Capillary: 101 mg/dL — ABNORMAL HIGH (ref 70–99)

## 2019-02-10 LAB — MRSA PCR SCREENING: MRSA by PCR: NEGATIVE

## 2019-02-10 MED ORDER — LACTATED RINGERS IV SOLN
INTRAVENOUS | Status: DC
  Administered 2019-02-10: 12:00:00 via INTRAVENOUS
  Administered 2019-02-11: 100 mL/h via INTRAVENOUS
  Administered 2019-02-11: via INTRAVENOUS
  Administered 2019-02-12: 100 mL/h via INTRAVENOUS

## 2019-02-10 MED ORDER — GUAIFENESIN-DM 100-10 MG/5ML PO SYRP
5.0000 mL | ORAL_SOLUTION | ORAL | Status: DC | PRN
  Administered 2019-02-11: 5 mL via ORAL
  Filled 2019-02-10: qty 5

## 2019-02-10 MED ORDER — GABAPENTIN 400 MG PO CAPS
400.0000 mg | ORAL_CAPSULE | Freq: Three times a day (TID) | ORAL | Status: DC
  Administered 2019-02-10 – 2019-02-11 (×4): 400 mg via ORAL
  Filled 2019-02-10 (×4): qty 1

## 2019-02-10 NOTE — Progress Notes (Signed)
PROGRESS NOTE    Curtis [name]  [id number] DOB: 03/31/1946 DOA: 02/08/2019 PCP: [name], MD   Brief Narrative:  Per HPI: Curtis Cipro Malloyis a 73 y.o.malewith medical history significant ofAAA, osteoarthritis, asbestosis, CAD, history of an STEMI, cancer, depression, diverticulosis,/diabetic colitis, GERD, glaucoma, abdominal hernia, hyperlipidemia, hypertension, IBS, peripheral vascular disease, history of pneumonia, reflex sympathetic dystrophy, stage III and IV decubiti ulcers who is brought to the emergency department due to elevated d-dimer and decreased mentation. The patient has been getting IV antibiotics at the facility for pneumonia. He is unable to provide further information at this time.  10/7: Patient has been admitted with acute encephalopathy as well as elevated d-dimer and transaminitis with further studies currently pending.  10/8: Acute encephalopathy appears to be improving along with AKI.  Will resume half dose of home gabapentin due to some lower extremity pain noted.  Transaminitis is downtrending with hepatitis panel negative as well as abdominal ultrasound with no acute findings.  Assessment & Plan:   Principal Problem:   Acute hepatitis Active Problems:   GERD   AKI (acute kidney injury) ([place])   HTN (hypertension)   SVT (supraventricular tachycardia) s/p ablation 01/16/2014   HCAP (healthcare-associated pneumonia)   Pressure ulcer of right heel, stage 2 (HCC)   Acute metabolic encephalopathy likely secondary to polypharmacy in the setting of AKI -Patient appears to be improving with ammonia level of 28 and TSH of 2.1 -UA seems to demonstrate UTI with urine culture pending, continue current antibiotics -Continue to monitor closely -Hold home Xanax, and Elavil and restart half dose of home gabapentin due to lower extremity pain  Elevated d-dimer -I do not suspect PE at this time and patient has been on Eliquis -We will consider CT  angiogram for thorough evaluation once AKI resolved -No associated signs or symptoms or increased oxygen needs noted  Acute kidney injury-improving -Continue IV fluids as ordered -Urine output not recorded and will need to be monitored closely -Avoid nephrotoxic agents -Repeat renal panel in a.m. -Check CK level-92  Transaminitis-resolving -Right upper quadrant ultrasound with gallbladder distention no cholelithiasis noted-no other acute findings -Acute hepatitis panel with no acute findings -Monitor CMP in a.m. -No abdominal pain or other signs of jaundice noted  HCAP -Continue on current antibiotics and discontinue vancomycin today since MRSA PCR negative -Monitor microbiology -Continue supplemental oxygen as well as duo nebs as needed -Robitussin prescribed for cough  GERD -Continue famotidine  Hypertension -Continue metoprolol 25mg  BID  SVT status post ablation 01/2014 -Continue apixaban and metoprolol  Pressure ulcer of bilateral heels stage II -Continue local wound care  DVT prophylaxis: Apixaban Code Status: Partial code-no mechanical ventilation Family Communication: We will plan to contact family Disposition Plan: Continue to monitor encephalopathy maintain on IV fluid and repeat labs.  Discharge back to [facility] once stable.   Consultants:   None  Procedures:   None  Antimicrobials:  Anti-infectives (From admission, onward)   Start     Dose/Rate Route Frequency Ordered Stop   02/10/19 0600  vancomycin (VANCOCIN) IVPB 1000 mg/200 mL premix  Status:  Discontinued     1,000 mg 200 mL/hr over 60 Minutes Intravenous Every 24 hours 02/09/19 0304 02/10/19 1048   02/09/19 0600  ceFEPIme (MAXIPIME) 2 g in sodium chloride 0.9 % 100 mL IVPB     2 g 200 mL/hr over 30 Minutes Intravenous Every 12 hours 02/09/19 0304     02/09/19 0415  vancomycin (VANCOCIN) IVPB 1000 mg/200 mL premix  1,000 mg 200 mL/hr over 60 Minutes Intravenous  Once 02/09/19  0304 02/09/19 0633   02/09/19 0315  vancomycin (VANCOCIN) IVPB 1000 mg/200 mL premix     1,000 mg 200 mL/hr over 60 Minutes Intravenous  Once 02/09/19 0304 02/09/19 0510   02/09/19 0230  azithromycin (ZITHROMAX) 500 mg in sodium chloride 0.9 % 250 mL IVPB     500 mg 250 mL/hr over 60 Minutes Intravenous Daily at bedtime 02/09/19 0218     02/08/19 2115  ceFEPIme (MAXIPIME) 1 g in sodium chloride 0.9 % 100 mL IVPB     1 g 200 mL/hr over 30 Minutes Intravenous  Once 02/08/19 2112 02/08/19 2330   02/08/19 2115  metroNIDAZOLE (FLAGYL) IVPB 500 mg     500 mg 100 mL/hr over 60 Minutes Intravenous  Once 02/08/19 2112 02/08/19 2234       Subjective: Patient seen and evaluated today with no new acute complaints or concerns. No acute concerns or events noted overnight.  He appears to be more alert and less confused today.  Complaining of some lower extremity pain bilaterally.  He is having some ongoing coughing.  Objective: Vitals:   02/09/19 1939 02/09/19 2211 02/10/19 0522 02/10/19 0802  BP:  120/70 (!) 108/59   Pulse:  92 92   Resp:  18 20   Temp:  97.7 F (36.5 C) 98.9 F (37.2 C)   TempSrc:  Oral Oral   SpO2: 94%  95% 95%  Weight:        Intake/Output Summary (Last 24 hours) at 02/10/2019 1111 Last data filed at 02/10/2019 [id number] Gross per 24 hour  Intake 2617.19 ml  Output 860 ml  Net 1757.19 ml   Filed Weights   02/08/19 2055  Weight: 125.8 kg    Examination:  General exam: Appears calm and comfortable  Respiratory system: Clear to auscultation. Respiratory effort normal.  Currently on 3 L nasal cannula oxygen. Cardiovascular system: S1 & S2 heard, RRR. No JVD, murmurs, rubs, gallops or clicks. No pedal edema. Gastrointestinal system: Abdomen is nondistended, soft and nontender. No organomegaly or masses felt. Normal bowel sounds heard. Central nervous system: Alert and awake Extremities: Symmetric 5 x 5 power. Skin: No rashes, lesions or ulcers Psychiatry: Judgement  and insight appear normal. Mood & affect appropriate.     Data Reviewed: I have personally reviewed following labs and imaging studies  CBC: Recent Labs  Lab 02/04/19 1300 02/08/19 1300 02/09/19 0930 02/10/19 0520  WBC 18.0* 27.4* 16.2* 16.0*  NEUTROABS 14.1*  --  12.6*  --   HGB 11.0* 10.4* 9.7* 9.0*  HCT 36.3* 34.0* 32.7* 30.1*  MCV 92.4 93.7 95.1 95.0  PLT 545* 478* 347 [id number]   Basic Metabolic Panel: Recent Labs  Lab 02/08/19 1300 02/09/19 0930 02/10/19 0520  NA 131* 133* 135  K 4.8 4.0 3.9  CL 92* 96* 100  CO2 27 27 27   GLUCOSE 201* 106* 90  BUN 36* 37* 34*  CREATININE 2.31* 2.14* 1.72*  CALCIUM 8.7* 8.1* 8.2*   GFR: Estimated Creatinine Clearance: 51.6 mL/min (A) (by C-G formula based on SCr of 1.72 mg/dL (H)). Liver Function Tests: Recent Labs  Lab 02/08/19 1300 02/09/19 0930 02/10/19 0520  AST 1,485* 651* 209*  ALT 846* 801* 515*  ALKPHOS 88 74 74  BILITOT 0.5 0.5 0.3  PROT 6.3* 6.1* 5.9*  ALBUMIN 2.1* 2.0* 1.9*   Recent Labs  Lab 02/08/19 1545  LIPASE 22  AMYLASE 16*   Recent Labs  Lab  02/09/19 1453  AMMONIA 28   Coagulation Profile: Recent Labs  Lab 02/09/19 0930  INR 2.4*   Cardiac Enzymes: Recent Labs  Lab 02/09/19 1453  CKTOTAL 91   BNP (last 3 results) No results for input(s): PROBNP in the last 8760 hours. HbA1C: No results for input(s): HGBA1C in the last 72 hours. CBG: No results for input(s): GLUCAP in the last 168 hours. Lipid Profile: No results for input(s): CHOL, HDL, LDLCALC, TRIG, CHOLHDL, LDLDIRECT in the last 72 hours. Thyroid Function Tests: Recent Labs    02/09/19 1453  TSH 2.140   Anemia Panel: Recent Labs    02/08/19 1545  FERRITIN 1,701*   Sepsis Labs: Recent Labs  Lab 02/08/19 2129 02/09/19 0930  LATICACIDVEN 2.5* 1.0    Recent Results (from the past 240 hour(s))  C difficile quick scan w PCR reflex     Status: None   Collection Time: 02/08/19  9:41 AM   Specimen: STOOL  Result Value  Ref Range Status   C Diff antigen NEGATIVE NEGATIVE Final   C Diff toxin NEGATIVE NEGATIVE Final   C Diff interpretation No C. difficile detected.  Final    Comment: Performed at [facility], [address], [place], [place] [address]  SARS Coronavirus 2 [facility] order, Performed in [facility] hospital lab) Nasopharyngeal Nasopharyngeal Swab     Status: None   Collection Time: 02/08/19 11:33 PM   Specimen: Nasopharyngeal Swab  Result Value Ref Range Status   SARS Coronavirus 2 NEGATIVE NEGATIVE Final    Comment: (NOTE) If result is NEGATIVE SARS-CoV-2 target nucleic acids are NOT DETECTED. The SARS-CoV-2 RNA is generally detectable in upper and lower  respiratory specimens during the acute phase of infection. The lowest  concentration of SARS-CoV-2 viral copies this assay can detect is 250  copies / mL. A negative result does not preclude SARS-CoV-2 infection  and should not be used as the sole basis for treatment or other  patient management decisions.  A negative result may occur with  improper specimen collection / handling, submission of specimen other  than nasopharyngeal swab, presence of viral mutation(s) within the  areas targeted by this assay, and inadequate number of viral copies  (<250 copies / mL). A negative result must be combined with clinical  observations, patient history, and epidemiological information. If result is POSITIVE SARS-CoV-2 target nucleic acids are DETECTED. The SARS-CoV-2 RNA is generally detectable in upper and lower  respiratory specimens dur ing the acute phase of infection.  Positive  results are indicative of active infection with SARS-CoV-2.  Clinical  correlation with patient history and other diagnostic information is  necessary to determine patient infection status.  Positive results do  not rule out bacterial infection or co-infection with other viruses. If result is PRESUMPTIVE POSTIVE SARS-CoV-2 nucleic acids MAY BE PRESENT.   A  presumptive positive result was obtained on the submitted specimen  and confirmed on repeat testing.  While 2019 novel coronavirus  (SARS-CoV-2) nucleic acids may be present in the submitted sample  additional confirmatory testing may be necessary for epidemiological  and / or clinical management purposes  to differentiate between  SARS-CoV-2 and other Sarbecovirus currently known to infect humans.  If clinically indicated additional testing with an alternate test  methodology [phone]) is advised. The SARS-CoV-2 RNA is generally  detectable in upper and lower respiratory sp ecimens during the acute  phase of infection. The expected result is Negative. Fact Sheet for Patients:  StrictlyIdeas.no Fact Sheet for  Healthcare Providers: BankingDealers.co.za This test is not yet approved or cleared by the [place] and has been authorized for detection and/or diagnosis of SARS-CoV-2 by FDA under an Emergency Use Authorization (EUA).  This EUA will remain in effect (meaning this test can be used) for the duration of the COVID-19 declaration under Section 564(b)(1) of the Act, 21 U.S.C. section 360bbb-3(b)(1), unless the authorization is terminated or revoked sooner. Performed at [facility], [address], [place], [place] [address]   MRSA PCR Screening     Status: None   Collection Time: 02/09/19 10:35 PM   Specimen: Urine, Catheterized; Nasopharyngeal  Result Value Ref Range Status   MRSA by PCR NEGATIVE NEGATIVE Final    Comment:        The GeneXpert MRSA Assay (FDA approved for NASAL specimens only), is one component of a comprehensive MRSA colonization surveillance program. It is not intended to diagnose MRSA infection nor to guide or monitor treatment for MRSA infections. Performed at [facility], [address] [place], [place] [address]          Radiology Studies: Ct Abdomen Pelvis Wo Contrast  Result Date:  02/08/2019 CLINICAL DATA:  Nausea, vomiting, and diarrhea. Abnormal LFTs. Acute renal failure. EXAM: CT ABDOMEN AND PELVIS WITHOUT CONTRAST TECHNIQUE: Multidetector CT imaging of the abdomen and pelvis was performed following the standard protocol without IV contrast. COMPARISON:  CT abdomen pelvis dated November 10, 2014. FINDINGS: Lower chest: No acute abnormality. Cardiomegaly. Chronic atelectasis and scarring in the lung basis with multiple calcified pleural plaques. Hepatobiliary: No focal liver abnormality is seen. No gallstones, gallbladder wall thickening, or biliary dilatation. Pancreas: Unremarkable. No pancreatic ductal dilatation or surrounding inflammatory changes. Spleen: Normal in size without focal abnormality. Adrenals/Urinary Tract: The adrenal glands are unremarkable. 1.9 cm right renal simple cyst, mildly enlarged since 2016. No renal calculi or hydronephrosis. The bladder is decompressed by Foley catheter. Stomach/Bowel: Prior Nissen fundoplication. Stomach is otherwise within normal limits. No bowel wall thickening, distention, or surrounding inflammatory changes. Mild left-sided colonic diverticulosis. Normal appendix. Vascular/Lymphatic: Extensive aortoiliac atherosclerosis with unchanged 3.2 cm infrarenal abdominal aortic aneurysm. No enlarged abdominal or pelvic lymph nodes. Reproductive: Prostate is unremarkable. Other: New small ascites.  No pneumoperitoneum. Musculoskeletal: No acute or significant osseous findings. IMPRESSION: 1.  No acute intra-abdominal process. 2. New small ascites. 3. Unchanged 3.2 cm infrarenal abdominal aortic aneurysm. Recommend followup by ultrasound in 3 years. This recommendation follows ACR consensus guidelines: White Paper of the ACR Incidental Findings Committee II on Vascular Findings. J Am Coll Radiol 2013; 10:789-794. Aortic aneurysm NOS (ICD10-I71.9) 4.  Aortic atherosclerosis (ICD10-I70.0). Electronically Signed   By: Titus Dubin M.D.   On: 02/08/2019  22:48   Dg Chest Port 1 View  Result Date: 02/08/2019 CLINICAL DATA:  Cough, elevated D-dimer EXAM: PORTABLE CHEST 1 VIEW COMPARISON:  February 04, 2019 FINDINGS: There is cardiomegaly. Increased reticulonodular opacities are again noted throughout both lungs. Calcified pleural plaques are again noted. A right-sided PICC catheter is seen with the tip just entering the upper SVC. No pneumothorax. No acute osseous abnormality. IMPRESSION: Unchanged reticulonodular opacities throughout both lungs, likely due to chronic lung changes, however cannot exclude superimposed infectious etiology. Electronically Signed   By: Prudencio Pair M.D.   On: 02/08/2019 22:28   US Abdomen Limited Ruq  Result Date: 02/09/2019 CLINICAL DATA:  Acute hepatitis. EXAM: ULTRASOUND ABDOMEN LIMITED RIGHT UPPER QUADRANT COMPARISON:  CT scan of February 08, 2019. FINDINGS: Gallbladder: Gallbladder distention is noted without  cholelithiasis or gallbladder wall thickening. No pericholecystic fluid is noted. The absence or presence of sonographic Murphy's sign was not reported by the technologist. Common bile duct: Diameter: 4 mm which is within normal limits. Liver: No focal lesion identified. Within normal limits in parenchymal echogenicity. Portal vein is patent on color Doppler imaging with normal direction of blood flow towards the liver. Other: None. IMPRESSION: Gallbladder distention is noted without cholelithiasis. No gallbladder wall thickening or pericholecystic fluid is noted. No other abnormality seen in the right upper quadrant of the abdomen. Electronically Signed   By: Marijo Conception M.D.   On: 02/09/2019 08:59        Scheduled Meds:  apixaban  5 mg Oral BID   Chlorhexidine Gluconate Cloth  6 each Topical Daily   feeding supplement (PRO-STAT SUGAR FREE 64)  30 mL Oral BID BM   finasteride  5 mg Oral [id number]   gabapentin  400 mg Oral TID   hydrocortisone cream   Topical BID   latanoprost  1 drop Both Eyes QHS    metoprolol tartrate  25 mg Oral BID   mometasone-formoterol  2 puff Inhalation BID   tamsulosin  0.4 mg Oral QHS   Continuous Infusions:  azithromycin 500 mg (02/09/19 2221)   ceFEPime (MAXIPIME) IV 2 g (02/10/19 0552)     LOS: 2 days    Time spent: 30 minutes    Xavia Kniskern Darleen Crocker, DO [facility] Pager [phone]  If 7PM-7AM, please contact night-coverage [email or web address] Password [email or web address] 02/10/2019, 11:11 AM

## 2019-02-11 ENCOUNTER — Inpatient Hospital Stay (HOSPITAL_COMMUNITY): Payer: Medicare Other

## 2019-02-11 DIAGNOSIS — L89612 Pressure ulcer of right heel, stage 2: Secondary | ICD-10-CM

## 2019-02-11 LAB — CBC
HCT: 30.4 % — ABNORMAL LOW (ref 39.0–52.0)
Hemoglobin: 9.1 g/dL — ABNORMAL LOW (ref 13.0–17.0)
MCH: 28.5 pg (ref 26.0–34.0)
MCHC: 29.9 g/dL — ABNORMAL LOW (ref 30.0–36.0)
MCV: 95.3 fL (ref 80.0–100.0)
Platelets: 295 10*3/uL (ref 150–400)
RBC: 3.19 MIL/uL — ABNORMAL LOW (ref 4.22–5.81)
RDW: 18.6 % — ABNORMAL HIGH (ref 11.5–15.5)
WBC: 11.6 10*3/uL — ABNORMAL HIGH (ref 4.0–10.5)
nRBC: 0.5 % — ABNORMAL HIGH (ref 0.0–0.2)

## 2019-02-11 LAB — COMPREHENSIVE METABOLIC PANEL
ALT: 352 U/L — ABNORMAL HIGH (ref 0–44)
AST: 112 U/L — ABNORMAL HIGH (ref 15–41)
Albumin: 1.9 g/dL — ABNORMAL LOW (ref 3.5–5.0)
Alkaline Phosphatase: 72 U/L (ref 38–126)
Anion gap: 6 (ref 5–15)
BUN: 29 mg/dL — ABNORMAL HIGH (ref 8–23)
CO2: 27 mmol/L (ref 22–32)
Calcium: 8.3 mg/dL — ABNORMAL LOW (ref 8.9–10.3)
Chloride: 103 mmol/L (ref 98–111)
Creatinine, Ser: 1.21 mg/dL (ref 0.61–1.24)
GFR calc Af Amer: >60 mL/min (ref >60)
GFR calc non Af Amer: 59 mL/min — ABNORMAL LOW (ref >60)
Glucose, Bld: 96 mg/dL (ref 70–99)
Potassium: 3.7 mmol/L (ref 3.5–5.1)
Sodium: 136 mmol/L (ref 135–145)
Total Bilirubin: 0.5 mg/dL (ref 0.3–1.2)
Total Protein: 5.8 g/dL — ABNORMAL LOW (ref 6.5–8.1)

## 2019-02-11 LAB — URINE CULTURE: Culture: 40000 — AB

## 2019-02-11 MED ORDER — COLLAGENASE 250 UNIT/GM EX OINT
TOPICAL_OINTMENT | Freq: Every day | CUTANEOUS | Status: DC
  Administered 2019-02-11 – 2019-02-14 (×4): via TOPICAL
  Filled 2019-02-11: qty 30

## 2019-02-11 MED ORDER — IOHEXOL 350 MG/ML SOLN
100.0000 mL | Freq: Once | INTRAVENOUS | Status: AC | PRN
  Administered 2019-02-11: 100 mL via INTRAVENOUS

## 2019-02-11 MED ORDER — OCUVITE-LUTEIN PO CAPS
1.0000 | ORAL_CAPSULE | Freq: Every day | ORAL | Status: DC
  Administered 2019-02-11 – 2019-02-14 (×4): 1 via ORAL
  Filled 2019-02-11 (×4): qty 1

## 2019-02-11 MED ORDER — SACCHAROMYCES BOULARDII 250 MG PO CAPS
250.0000 mg | ORAL_CAPSULE | Freq: Two times a day (BID) | ORAL | Status: DC
  Administered 2019-02-11 – 2019-02-14 (×7): 250 mg via ORAL
  Filled 2019-02-11 (×7): qty 1

## 2019-02-11 MED ORDER — ENSURE MAX PROTEIN PO LIQD
11.0000 [oz_av] | Freq: Two times a day (BID) | ORAL | Status: DC
  Administered 2019-02-11 – 2019-02-13 (×2): 11 [oz_av] via ORAL

## 2019-02-11 MED ORDER — JUVEN PO PACK
1.0000 | PACK | Freq: Two times a day (BID) | ORAL | Status: DC
  Administered 2019-02-11 – 2019-02-14 (×4): 1 via ORAL
  Filled 2019-02-11 (×7): qty 1

## 2019-02-11 MED ORDER — GABAPENTIN 400 MG PO CAPS
800.0000 mg | ORAL_CAPSULE | Freq: Three times a day (TID) | ORAL | Status: DC
  Administered 2019-02-11 – 2019-02-14 (×9): 800 mg via ORAL
  Filled 2019-02-11 (×9): qty 2

## 2019-02-11 MED ORDER — SODIUM CHLORIDE 0.9 % IV SOLN
2.0000 g | Freq: Three times a day (TID) | INTRAVENOUS | Status: DC
  Administered 2019-02-11 – 2019-02-13 (×7): 2 g via INTRAVENOUS
  Filled 2019-02-11 (×8): qty 2

## 2019-02-11 MED ORDER — MORPHINE SULFATE (PF) 2 MG/ML IV SOLN
0.5000 mg | INTRAVENOUS | Status: AC | PRN
  Administered 2019-02-11 (×2): 0.5 mg via INTRAVENOUS
  Filled 2019-02-11 (×2): qty 1

## 2019-02-11 NOTE — Progress Notes (Signed)
Initial Nutrition Assessment  DOCUMENTATION CODES:   Obesity unspecified  INTERVENTION:  Continue Prostat po BID, each supplement provides 100 kcal and 15 grams protein  -Ensure Max po, BID; each supplement provides 150 kcal and 30 grams of protein  -Juven BID, each packet provides 95 calories, 2.5 grams of protein (collagen), and 9.8 grams of carbohydrate (3 grams sugar); also contains 7 grams of L-arginine and L-glutamine, 300 mg vitamin C, 15 mg vitamin E, 1.2 mcg vitamin B-12, 9.5 mg zinc, 200 mg calcium, and 1.5 g  Calcium Beta-hydroxy-Beta-methylbutyrate to support wound healing  -Ocuvite daily, MVI provides 200 mg vitC, 40 mg zinc, 55 mcg selenium, 2 mg copper, and 2 mg luetin to support wound healing  -Recommend recheck of current weight (weight difference of 75 lb from 10/6 [facility])   NUTRITION DIAGNOSIS:   Increased nutrient needs related to wound healing as evidenced by estimated needs.   GOAL:   Patient will meet greater than or equal to 90% of their needs   MONITOR:   PO intake, I & O's, Skin, Supplement acceptance, Labs, Weight trends  REASON FOR ASSESSMENT:   Consult Wound healing  ASSESSMENT:  73 year old male with medical history significant of AAA, osteoarthritis, asbestosis, CAD, hx of STEMI, cancer, depression, diverticulosis, diabetic colitis, GERD, abdominal hernia, HLD, HLD, IBS, PVD, recent 8/29 [facility] admit with pnemonia who presented to ED from [facility] due to elevated d-dimer and decreased mentation. Patient on IV antibiotics at facility for pneumonia.  Unable to obtain nutrition history at this time; patient out of room for CT. Currently eating 50-100% of meals at this time. Patient presents with with worsening of pressure injuries: 5cm x 3 cm x 0.1 cm to sacrum, 6.5cm x 7.7cm x 0.1cm to left heel, 5cm x 3cm x 0cm to right heel. WOC noted minimal liklihood of heeling considering PVD. Surgery consulted; noted possible need for debridement of sacral decubitus  ulcer in the next few days; would like to minimize size of open wound if possible. Provided Ensure Max as well as liquid protein supplement Juven to aid with wound healing.  Suspect weight entry error; patient with 2 weights taken on 10/6 (91.7 kg and 125.8 kg) Discussed weights with [facility] RD who reports weight prior to current admission as 202 lb. Considering current wt trends per chart review, will use 91.7 kg for assessment of needs.  I/Os: +4071 ml since admit      -535 ml x 24 hr UOP: 3100 ml x 24 hr  Medications reviewed and include: gabapentin, prostat, maxipime  Labs: CBGS 101 Ca corrects for low albumin 9.9 WBC 11.6 trending down  Current wt 91.7 kg (201.7 lb) 8/30 pt weighed 95.7 kg (210.5 lb); 84.9 kg - 89 kg over the past month  NUTRITION - FOCUSED PHYSICAL EXAM: Unable to complete at this time; pt out of room   Diet Order:   Diet Order            Diet Heart Room service appropriate? Yes; Fluid consistency: Thin  Diet effective now              EDUCATION NEEDS:   No education needs have been identified at this time  Skin:  Skin Assessment: Skin Integrity Issues: Skin Integrity Issues:: Stage II, Unstageable, Other (Comment) Stage II: sacrum Unstageable: bilateral heels Other: MASD; perineum; abdomen; toe; left; great  Last BM:  10/9; type 6; large; brown  Height:   Ht Readings from Last 1 Encounters:  02/08/19 6' (  1.829 m)    Weight:   Wt Readings from Last 1 Encounters:  02/08/19 125.8 kg    Ideal Body Weight:  80.9 kg  BMI:  Body mass index is 37.62 kg/m.  Estimated Nutritional Needs:   Kcal:  2040-2210 (MSJ 1.2-1.3)  Protein:  121-138 (1.5-1.7 g/kg/IBW)  Fluid:  >/= 2 L/day   Lajuan Lines, RD, LDN Clinical Nutrition  Office [phone] After Hours/Weekend Pager: [phone]

## 2019-02-11 NOTE — TOC Transition Note (Addendum)
Transition of Care [facility]) - CM/SW Discharge Note   Patient Details  Name: Curtis [name] MRN: [id number] Date of Birth: 02/13/1946  [facility]) CM/SW Contact:  Curtis Flood, LCSW Phone Number: 02/11/2019, 11:49 AM   Clinical Narrative:     Anticipating pt will dc back to [facility] over the weekend. Updated Kerri at [facility]. She states that RN can call report and they will come get pt.  Notified Kerri at [facility] that wound nurse recommending air mattress for pt due to three pressure wounds. Per Marianna Fuss, their wound care RN will review record and they will arrange if pt meets criteria.  Weekend TOC will be available if needed.   Final next level of care: Skilled Nursing Facility Barriers to Discharge: No Barriers Identified   Patient Goals and CMS Choice        Discharge Placement                       Discharge Plan and Services   Discharge Planning Services: CM Consult                                 Social Determinants of Health (SDOH) Interventions     Readmission Risk Interventions Readmission Risk Prevention Plan 02/11/2019 01/04/2019 12/19/2018  Transportation Screening Complete Complete Complete  PCP or Specialist Appt within 5-7 Days - - Complete  PCP or Specialist Appt within 3-5 Days - Complete -  Home Care Screening - - Complete  Medication Review (RN CM) - - Complete  [place] or [place] - Complete -  Social Work Consult for [place] Planning/Counseling - Complete -  Palliative Care Screening - Not Applicable -  Medication Review Press photographer) Complete Complete -  PCP or Specialist appointment within 3-5 days of discharge Not Complete - -  PCP/Specialist Appt Not Complete comments Pt returning to SNF - -  [place] or [place] Not Complete - -  [place] or [place] Pt Refusal Comments Pt returning to SNF - -  [facility] Recovery Care/Counseling Consult Complete - -  Palliative Care Screening Not Applicable - -  [place] Complete - -  Some recent data might be hidden

## 2019-02-11 NOTE — Consult Note (Signed)
Reason for Consult: Bilateral heel, sacral decubitus ulcers Referring Physician: Dr. Randa Spike is an 73 y.o. male.  HPI: Patient is a 73 year old white male with multiple medical problems who was found on admission to have multiple eschars present on both heels and sacrum.  A wound nurse consultation was obtained.  Please see their note.  I was asked to see the patient concerning possible debridement of the sacral decubitus ulcer.  I could not get a review of systems from the patient due to his mental status.  Past Medical History:  Diagnosis Date  . AAA (abdominal aortic aneurysm), stable 04/20/2014  . Arthritis   . 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 CAD (coronary artery disease)    a. s/p cath in 2015 showing occluded RCA and high-grade LCx stenosis treated with DESx2 to LCx  . Cancer ([place])   . Depression   . Diverticulitis    hospital 2011 [facility]  . Diverticulosis   . GERD (gastroesophageal reflux disease)   . 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 Hernia of unspecified site of abdominal cavity without mention of obstruction or gangrene    hiatal  . Hyperlipidemia LDL goal <70 04/20/2014  . Hypertension   . IBS (irritable bowel syndrome)   . Myocardial infarction ([place]) 01/2014   NSTEMI  . Peripheral vascular disease ([facility])    ankle brachial index of 0.79 on the right and 0.61 on the left.   . Pneumonia   . 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 Reflex sympathetic dystrophy   . 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 SOB (shortness of breath)     Past Surgical History:  Procedure Laterality Date  . CARDIAC CATHETERIZATION    . CATARACT EXTRACTION    . CORONARY ANGIOPLASTY    . HERNIA REPAIR    . LEFT HEART CATHETERIZATION WITH CORONARY ANGIOGRAM N/A 01/17/2014   Procedure: LEFT HEART CATHETERIZATION WITH CORONARY ANGIOGRAM;  Surgeon: Leonie Man, MD;  Location: [facility] CATH LAB;  Service: Cardiovascular;  Laterality: N/A;  . left shoulder      x 3  . NISSEN FUNDOPLICATION    .  right elbow surgery     x 2  . right knee arthroscopy    . squamous cell skin cancer     Left Hand  . SUPRAVENTRICULAR TACHYCARDIA ABLATION N/A 01/16/2014   Procedure: SUPRAVENTRICULAR TACHYCARDIA ABLATION;  Surgeon: Evans Lance, MD;  Location: [facility] CATH LAB;  Service: Cardiovascular;  Laterality: N/A;    Family History  Problem Relation Age of Onset  . Colon cancer Mother   . Colon cancer Maternal Aunt   . Colon cancer Maternal Uncle   . Heart attack Neg Hx   . Stroke Neg Hx     Social History:  reports that he quit smoking about 13 years ago. His smoking use included cigarettes. He has a 125.00 pack-year smoking history. He quit smokeless tobacco use about 6 years ago. He reports that he does not drink alcohol or use drugs.  Allergies:  Allergies  Allergen Reactions  . Codeine Itching  . Trileptal [Oxcarbazepine]     Other reaction(s): Other (See Comments) Abnormal sodium levels Hyponatremia    Medications: I have reviewed the patient's current medications.  Results for orders placed or performed during the hospital encounter of 02/08/19 (from the past 48 hour(s))  CK     Status: None   Collection Time: 02/09/19  2:53 PM  Result Value Ref Range   Total  CK 91 49 - 397 U/L    Comment: Performed at [facility], [address] [place], [place] [address]  TSH     Status: None   Collection Time: 02/09/19  2:53 PM  Result Value Ref Range   TSH 2.140 0.350 - 4.500 uIU/mL    Comment: Performed by a 3rd Generation assay with a functional sensitivity of <=0.01 uIU/mL. Performed at [facility], [address], [place], [place] [address]   Ammonia     Status: None   Collection Time: 02/09/19  2:53 PM  Result Value Ref Range   Ammonia 28 9 - 35 umol/L    Comment: Performed at [facility], [address], [place], [place] [address]  MRSA PCR Screening     Status: None   Collection Time: 02/09/19 10:35 PM   Specimen: Urine, Catheterized; Nasopharyngeal  Result Value Ref Range    MRSA by PCR NEGATIVE NEGATIVE    Comment:        The GeneXpert MRSA Assay (FDA approved for NASAL specimens only), is one component of a comprehensive MRSA colonization surveillance program. It is not intended to diagnose MRSA infection nor to guide or monitor treatment for MRSA infections. Performed at [facility], [address] [place], [place] [address]   Comprehensive metabolic panel     Status: Abnormal   Collection Time: 02/10/19  5:20 AM  Result Value Ref Range   Sodium 135 135 - 145 mmol/L   Potassium 3.9 3.5 - 5.1 mmol/L   Chloride 100 98 - 111 mmol/L   CO2 27 22 - 32 mmol/L   Glucose, Bld 90 70 - 99 mg/dL   BUN 34 (H) 8 - 23 mg/dL   Creatinine, Ser 1.72 (H) 0.61 - 1.24 mg/dL   Calcium 8.2 (L) 8.9 - 10.3 mg/dL   Total Protein 5.9 (L) 6.5 - 8.1 g/dL   Albumin 1.9 (L) 3.5 - 5.0 g/dL   AST 209 (H) 15 - 41 U/L   ALT 515 (H) 0 - 44 U/L   Alkaline Phosphatase 74 38 - 126 U/L   Total Bilirubin 0.3 0.3 - 1.2 mg/dL   GFR calc non Af Amer 38 (L) >60 mL/min   GFR calc Af Amer 44 (L) >60 mL/min   Anion gap 8 5 - 15    Comment: Performed at [facility], [address] [place], [place] [address]  CBC     Status: Abnormal   Collection Time: 02/10/19  5:20 AM  Result Value Ref Range   WBC 16.0 (H) 4.0 - 10.5 K/uL   RBC 3.17 (L) 4.22 - 5.81 MIL/uL   Hemoglobin 9.0 (L) 13.0 - 17.0 g/dL   HCT 30.1 (L) 39.0 - 52.0 %   MCV 95.0 80.0 - 100.0 fL   MCH 28.4 26.0 - 34.0 pg   MCHC 29.9 (L) 30.0 - 36.0 g/dL   RDW 18.5 (H) 11.5 - 15.5 %   Platelets 329 150 - 400 K/uL   nRBC 0.6 (H) 0.0 - 0.2 %    Comment: Performed at [facility], [address] [place], [place] [address]  Glucose, capillary     Status: Abnormal   Collection Time: 02/10/19  9:56 PM  Result Value Ref Range   Glucose-Capillary 101 (H) 70 - 99 mg/dL   Comment 1 Notify RN    Comment 2 Document in Chart   Comprehensive metabolic panel     Status: Abnormal   Collection Time: 02/11/19  4:22 AM  Result Value  Ref  Range   Sodium 136 135 - 145 mmol/L   Potassium 3.7 3.5 - 5.1 mmol/L   Chloride 103 98 - 111 mmol/L   CO2 27 22 - 32 mmol/L   Glucose, Bld 96 70 - 99 mg/dL   BUN 29 (H) 8 - 23 mg/dL   Creatinine, Ser 1.21 0.61 - 1.24 mg/dL   Calcium 8.3 (L) 8.9 - 10.3 mg/dL   Total Protein 5.8 (L) 6.5 - 8.1 g/dL   Albumin 1.9 (L) 3.5 - 5.0 g/dL   AST 112 (H) 15 - 41 U/L   ALT 352 (H) 0 - 44 U/L   Alkaline Phosphatase 72 38 - 126 U/L   Total Bilirubin 0.5 0.3 - 1.2 mg/dL   GFR calc non Af Amer 59 (L) >60 mL/min   GFR calc Af Amer >60 >60 mL/min   Anion gap 6 5 - 15    Comment: Performed at [facility], [address] [place], [place] [address]  CBC     Status: Abnormal   Collection Time: 02/11/19  4:22 AM  Result Value Ref Range   WBC 11.6 (H) 4.0 - 10.5 K/uL   RBC 3.19 (L) 4.22 - 5.81 MIL/uL   Hemoglobin 9.1 (L) 13.0 - 17.0 g/dL   HCT 30.4 (L) 39.0 - 52.0 %   MCV 95.3 80.0 - 100.0 fL   MCH 28.5 26.0 - 34.0 pg   MCHC 29.9 (L) 30.0 - 36.0 g/dL   RDW 18.6 (H) 11.5 - 15.5 %   Platelets 295 150 - 400 K/uL   nRBC 0.5 (H) 0.0 - 0.2 %    Comment: Performed at [facility], [address] [place], [place] [address]    Ct Angio Chest Pe W Or Wo Contrast  Result Date: 02/11/2019 CLINICAL DATA:  Shortness of breath EXAM: CT ANGIOGRAPHY CHEST WITH CONTRAST TECHNIQUE: Multidetector CT imaging of the chest was performed using the standard protocol during bolus administration of intravenous contrast. Multiplanar CT image reconstructions and MIPs were obtained to evaluate the vascular anatomy. CONTRAST:  132mL OMNIPAQUE IOHEXOL 350 MG/ML SOLN COMPARISON:  Abdomen CT abdomen pelvis, 02/08/2019, CT chest, 04/24/2014 FINDINGS: Cardiovascular: Satisfactory opacification of the pulmonary arteries to the segmental level. No evidence of pulmonary embolism. Cardiomegaly with 3 vessel coronary artery calcifications and/or stents. Trace pericardial effusion. Enlargement of the main pulmonary artery up to 4.0 cm. Aortic  atherosclerosis. Mediastinum/Nodes: Bulky mediastinal and hilar lymphadenopathy, similar to prior examination dated 04/24/2014, largest right paratracheal lymph nodes measuring 2.4 x 2.4 cm (series 4, image 25). Thyroid gland, trachea, and esophagus demonstrate no significant findings. Lungs/Pleura: There are large, bilateral calcified pleural plaques throughout. Small bilateral pleural effusions, generally loculated and chronic appearing. Upper Abdomen: Small volume ascites in the included upper abdomen. Musculoskeletal: No chest wall abnormality. No acute or significant osseous findings. Review of the MIP images confirms the above findings. IMPRESSION: 1.  Negative examination for pulmonary embolism. 2. There are large, bilateral calcified pleural plaques throughout. Small bilateral pleural effusions, generally loculated and chronic appearing. 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 3. Bulky mediastinal and hilar lymphadenopathy, similar to prior examination dated 04/24/2014, largest right paratracheal lymph nodes measuring 2.4 x 2.4 cm (series 4, image 25), of uncertain significance given long-term stability, perhaps reactive to extensive pleural disease. 4.  Cardiomegaly and coronary artery disease. 5. Enlargement of the main pulmonary artery, as can be seen in pulmonary hypertension. 6.  Ascites in the included upper abdomen. 7.  Aortic Atherosclerosis (ICD10-I70.0).  Electronically Signed   By: Eddie Candle M.D.   On: 02/11/2019 12:32    ROS:  Review of systems not obtained due to patient factors.  Blood pressure 140/74, pulse 95, temperature 98.4 F (36.9 C), temperature source Oral, resp. rate 16, weight 125.8 kg, SpO2 96 %. Physical Exam: Pleasant white male no acute distress Head is normocephalic, atraumatic Left heel with thick eschar present.  No purulent drainage present. Right heel with thick eschar present, no purulent drainage present. Sacrum with some skin necrosis/soft  eschar present which is approximately 4 to 5 cm in its greatest diameter.  No purulent drainage is noted.  Assessment/Plan: Impression: Multiple pressure ulcerations present in both heels and sacrum.  None appeared to be significantly infected at this time.  I agree with continuing the current wound care and that the wound care nurse has prescribed.  May need debridement of the sacral decubitus ulcer in the next few days.  Would like to see if we can minimize the size of an open wound.  Will follow with you.  Aviva Signs 02/11/2019, 1:15 PM

## 2019-02-11 NOTE — Consult Note (Signed)
[facility] Nurse wound consult note Reason for Consult: heel and sacral pressure injures Patient from SNF with worsening of pressure injures   Wound type: 1. Left heel; unstageable pressure injury; 100% eschar 2. Right heel; unstageable pressure injury;  100% black eschar 3. Left buttock: unstageable pressure injury; 70% black/dark purple/30% pink along the distal portion of the wound bed Pressure Injury POA: Yes Measurement: 1. Left heel: 6.5cm x 7.7cm x 0.1cm  2. Right heel: 5cm x 3cm x 0cm  3. Sacrum; 5cm x 4cm x 0.1cm  Wound bed:See above  Drainage (amount, consistency, odor) left heel and sacrum have some drainage, serosanginous noted on dressings in images, no odor noted on nursing flowsheets  Periwound: intact , noted some superficial sheer injury distal to the left buttock wound  Dressing procedure/placement/frequency: 1. Add enzymatic debridement ointment to the sacrum for conservative care, discussed with primary care that patient would need surgical debridement if the patient and family wish for aggressive care 2. Added air mattress for moisture management and pressure redistribution 3. Added Prevalon boots for offloading heels 4. Added alginate to line wound edges of heel wounds, best practice would be to try to leave the eschar intact. It is noted that they are draining some now. I am hoping the alginate will dry the wound edges and keep the eschar stable. The likelihood of him healing these is minimal especially in the presence of PVD and malnutrition.   5. Will request RD if not already involved for supplementation for wound healing.  6. Requested CM to have SNF provide low air loss mattress for patient upon his return to the facility.   Discussed POC with bedside nurse.  Re consult if needed, will not follow at this time. Thanks  Jadaya Sommerfield [employer], RN,CWOCN, CNS, [place] [phone])

## 2019-02-11 NOTE — Progress Notes (Signed)
PROGRESS NOTE    Curtis [name]  [id number] DOB: 03-01-1946 DOA: 02/08/2019 PCP: [name], MD   Brief Narrative:  Per HPI: Curtis [name] a 73 y.o.malewith medical history significant ofAAA, osteoarthritis, asbestosis, CAD, history of an STEMI, cancer, depression, diverticulosis,/diabetic colitis, GERD, glaucoma, abdominal hernia, hyperlipidemia, hypertension, IBS, peripheral vascular disease, history of pneumonia, reflex sympathetic dystrophy, stage III and IV decubiti ulcers who is brought to the emergency department due to elevated d-dimer and decreased mentation. The patient has been getting IV antibiotics at the facility for pneumonia. He is unable to provide further information at this time.  10/7:Patient has been admitted with acute encephalopathy as well as elevated d-dimer and transaminitis with further studies currently pending.  10/8: Acute encephalopathy appears to be improving along with AKI.  Will resume half dose of home gabapentin due to some lower extremity pain noted.  Transaminitis is downtrending with hepatitis panel negative as well as abdominal ultrasound with no acute findings.  10/9: Patient appears to have eschars on heels and decubitus ulcer of sacrum for which we will have wound care nurse evaluate further.  He appears to be doing well from an acute kidney injury standpoint and appears to be more alert and oriented.  We will continue antibiotics for H CAP for now and check CT angiogram to ensure no PE today.  Anticipate discharge potentially to SNF over the weekend.  Assessment & Plan:   Principal Problem:   Acute hepatitis Active Problems:   GERD   AKI (acute kidney injury) ([place])   HTN (hypertension)   SVT (supraventricular tachycardia) s/p ablation 01/16/2014   HCAP (healthcare-associated pneumonia)   Pressure ulcer of right heel, stage 2 (HCC)   Acute metabolic encephalopathy likely secondary to polypharmacy in the setting of AKI  -Patient appears to be improving with ammonia level of 28 and TSH of 2.1 -UA seems to demonstrate UTI with urine culture pending, continue current antibiotics -Continue to monitor closely -Hold home Xanax, and Elavil and restart full dose of home gabapentin due to lower extremity pain  Elevated d-dimer -I do not suspect PE at this time and patient has been on Eliquis -We will check CT chest angiogram to ensure no PE  Acute kidney injury-improving -Continue IV fluids as ordered -Urine output not recorded and will need to be monitored closely -Avoid nephrotoxic agents -Repeat renal panel in a.m. -Check CK level-92  Transaminitis-resolving -Right upper quadrant ultrasound with gallbladder distention no cholelithiasis noted-no other acute findings -Acute hepatitis panel with no acute findings -Monitor CMP in a.m. -No abdominal pain or other signs of jaundice noted  HCAP -Continue on current antibiotics and discontinue vancomycin today since MRSA PCR negative -Monitor microbiology -Continue supplemental oxygen as well as duo nebs as needed -Robitussin prescribed for cough  GERD -Continue famotidine  Hypertension -Continue metoprolol 25mg  BID  SVT status post ablation 01/2014 -Continue apixaban and metoprolol  Pressure ulcer of bilateral heels stage II -Continue local wound care  DVT prophylaxis:Apixaban Code Status:Partial code-no mechanical ventilation Family Communication:We will plan to contact family Disposition Plan:Continue on current antibiotics and monitor stools.  We will plan to discharge back to [facility] soon.   Consultants:  None  Procedures:  None  Antimicrobials:  Anti-infectives (From admission, onward)   Start     Dose/Rate Route Frequency Ordered Stop   02/11/19 1400  ceFEPIme (MAXIPIME) 2 g in sodium chloride 0.9 % 100 mL IVPB     2 g 200 mL/hr over 30 Minutes Intravenous  Every 8 hours 02/11/19 1049     02/10/19 0600   vancomycin (VANCOCIN) IVPB 1000 mg/200 mL premix  Status:  Discontinued     1,000 mg 200 mL/hr over 60 Minutes Intravenous Every 24 hours 02/09/19 0304 02/10/19 1048   02/09/19 0600  ceFEPIme (MAXIPIME) 2 g in sodium chloride 0.9 % 100 mL IVPB  Status:  Discontinued     2 g 200 mL/hr over 30 Minutes Intravenous Every 12 hours 02/09/19 0304 02/11/19 1049   02/09/19 0415  vancomycin (VANCOCIN) IVPB 1000 mg/200 mL premix     1,000 mg 200 mL/hr over 60 Minutes Intravenous  Once 02/09/19 0304 02/09/19 0633   02/09/19 0315  vancomycin (VANCOCIN) IVPB 1000 mg/200 mL premix     1,000 mg 200 mL/hr over 60 Minutes Intravenous  Once 02/09/19 0304 02/09/19 0510   02/09/19 0230  azithromycin (ZITHROMAX) 500 mg in sodium chloride 0.9 % 250 mL IVPB     500 mg 250 mL/hr over 60 Minutes Intravenous Daily at bedtime 02/09/19 0218     02/08/19 2115  ceFEPIme (MAXIPIME) 1 g in sodium chloride 0.9 % 100 mL IVPB     1 g 200 mL/hr over 30 Minutes Intravenous  Once 02/08/19 2112 02/08/19 2330   02/08/19 2115  metroNIDAZOLE (FLAGYL) IVPB 500 mg     500 mg 100 mL/hr over 60 Minutes Intravenous  Once 02/08/19 2112 02/08/19 2234       Subjective: Patient seen and evaluated today with no new acute complaints or concerns. No acute concerns or events noted overnight.  He is starting to have some loose stools.  Objective: Vitals:   02/10/19 2149 02/10/19 2150 02/11/19 0612 02/11/19 0800  BP: 135/71  140/74   Pulse: 96  95   Resp:  18 16   Temp: 97.8 F (36.6 C)  98.4 F (36.9 C)   TempSrc: Oral  Oral   SpO2: 94%  99% 96%  Weight:        Intake/Output Summary (Last 24 hours) at 02/11/2019 1108 Last data filed at 02/11/2019 0234 Gross per 24 hour  Intake 2444.37 ml  Output 3100 ml  Net -655.63 ml   Filed Weights   02/08/19 2055  Weight: 125.8 kg    Examination:  General exam: Appears calm and comfortable  Respiratory system: Clear to auscultation. Respiratory effort normal.  Currently on nasal  cannula oxygen. Cardiovascular system: S1 & S2 heard, RRR. No JVD, murmurs, rubs, gallops or clicks. No pedal edema. Gastrointestinal system: Abdomen is nondistended, soft and nontender. No organomegaly or masses felt. Normal bowel sounds heard. Central nervous system: Alert and awake Extremities: Symmetric 5 x 5 power. Skin: Decubitus ulcers on left buttock as well as bilateral heels is noted. Psychiatry: Judgement and insight appear normal. Mood & affect appropriate.     Data Reviewed: I have personally reviewed following labs and imaging studies  CBC: Recent Labs  Lab 02/04/19 1300 02/08/19 1300 02/09/19 0930 02/10/19 0520 02/11/19 0422  WBC 18.0* 27.4* 16.2* 16.0* 11.6*  NEUTROABS 14.1*  --  12.6*  --   --   HGB 11.0* 10.4* 9.7* 9.0* 9.1*  HCT 36.3* 34.0* 32.7* 30.1* 30.4*  MCV 92.4 93.7 95.1 95.0 95.3  PLT 545* 478* 347 329 [id number]   Basic Metabolic Panel: Recent Labs  Lab 02/08/19 1300 02/09/19 0930 02/10/19 0520 02/11/19 0422  NA 131* 133* 135 136  K 4.8 4.0 3.9 3.7  CL 92* 96* 100 103  CO2 27 27 27  27  GLUCOSE 201* 106* 90 96  BUN 36* 37* 34* 29*  CREATININE 2.31* 2.14* 1.72* 1.21  CALCIUM 8.7* 8.1* 8.2* 8.3*   GFR: Estimated Creatinine Clearance: 73.4 mL/min (by C-G formula based on SCr of 1.21 mg/dL). Liver Function Tests: Recent Labs  Lab 02/08/19 1300 02/09/19 0930 02/10/19 0520 02/11/19 0422  AST 1,485* 651* 209* 112*  ALT 846* 801* 515* 352*  ALKPHOS 88 74 74 72  BILITOT 0.5 0.5 0.3 0.5  PROT 6.3* 6.1* 5.9* 5.8*  ALBUMIN 2.1* 2.0* 1.9* 1.9*   Recent Labs  Lab 02/08/19 1545  LIPASE 22  AMYLASE 16*   Recent Labs  Lab 02/09/19 1453  AMMONIA 28   Coagulation Profile: Recent Labs  Lab 02/09/19 0930  INR 2.4*   Cardiac Enzymes: Recent Labs  Lab 02/09/19 1453  CKTOTAL 91   BNP (last 3 results) No results for input(s): PROBNP in the last 8760 hours. HbA1C: No results for input(s): HGBA1C in the last 72 hours. CBG: Recent Labs   Lab 02/10/19 2156  GLUCAP 101*   Lipid Profile: No results for input(s): CHOL, HDL, LDLCALC, TRIG, CHOLHDL, LDLDIRECT in the last 72 hours. Thyroid Function Tests: Recent Labs    02/09/19 1453  TSH 2.140   Anemia Panel: Recent Labs    02/08/19 1545  FERRITIN 1,701*   Sepsis Labs: Recent Labs  Lab 02/08/19 2129 02/09/19 0930  LATICACIDVEN 2.5* 1.0    Recent Results (from the past 240 hour(s))  C difficile quick scan w PCR reflex     Status: None   Collection Time: 02/08/19  9:41 AM   Specimen: STOOL  Result Value Ref Range Status   C Diff antigen NEGATIVE NEGATIVE Final   C Diff toxin NEGATIVE NEGATIVE Final   C Diff interpretation No C. difficile detected.  Final    Comment: Performed at [facility], [address], [place], [place] [address]  SARS Coronavirus 2 [facility] order, Performed in [facility] hospital lab) Nasopharyngeal Nasopharyngeal Swab     Status: None   Collection Time: 02/08/19 11:33 PM   Specimen: Nasopharyngeal Swab  Result Value Ref Range Status   SARS Coronavirus 2 NEGATIVE NEGATIVE Final    Comment: (NOTE) If result is NEGATIVE SARS-CoV-2 target nucleic acids are NOT DETECTED. The SARS-CoV-2 RNA is generally detectable in upper and lower  respiratory specimens during the acute phase of infection. The lowest  concentration of SARS-CoV-2 viral copies this assay can detect is 250  copies / mL. A negative result does not preclude SARS-CoV-2 infection  and should not be used as the sole basis for treatment or other  patient management decisions.  A negative result may occur with  improper specimen collection / handling, submission of specimen other  than nasopharyngeal swab, presence of viral mutation(s) within the  areas targeted by this assay, and inadequate number of viral copies  (<250 copies / mL). A negative result must be combined with clinical  observations, patient history, and epidemiological information. If result is POSITIVE  SARS-CoV-2 target nucleic acids are DETECTED. The SARS-CoV-2 RNA is generally detectable in upper and lower  respiratory specimens dur ing the acute phase of infection.  Positive  results are indicative of active infection with SARS-CoV-2.  Clinical  correlation with patient history and other diagnostic information is  necessary to determine patient infection status.  Positive results do  not rule out bacterial infection or co-infection with other viruses. If result is PRESUMPTIVE POSTIVE SARS-CoV-2 nucleic acids MAY BE PRESENT.  A presumptive positive result was obtained on the submitted specimen  and confirmed on repeat testing.  While 2019 novel coronavirus  (SARS-CoV-2) nucleic acids may be present in the submitted sample  additional confirmatory testing may be necessary for epidemiological  and / or clinical management purposes  to differentiate between  SARS-CoV-2 and other Sarbecovirus currently known to infect humans.  If clinically indicated additional testing with an alternate test  methodology [phone]) is advised. The SARS-CoV-2 RNA is generally  detectable in upper and lower respiratory sp ecimens during the acute  phase of infection. The expected result is Negative. Fact Sheet for Patients:  StrictlyIdeas.no Fact Sheet for Healthcare Providers: BankingDealers.co.za This test is not yet approved or cleared by the [place] FDA and has been authorized for detection and/or diagnosis of SARS-CoV-2 by FDA under an Emergency Use Authorization (EUA).  This EUA will remain in effect (meaning this test can be used) for the duration of the COVID-19 declaration under Section 564(b)(1) of the Act, 21 U.S.C. section 360bbb-3(b)(1), unless the authorization is terminated or revoked sooner. Performed at [facility], [address], [place], [place] [address]   MRSA PCR Screening     Status: None   Collection Time: 02/09/19 10:35 PM    Specimen: Urine, Catheterized; Nasopharyngeal  Result Value Ref Range Status   MRSA by PCR NEGATIVE NEGATIVE Final    Comment:        The GeneXpert MRSA Assay (FDA approved for NASAL specimens only), is one component of a comprehensive MRSA colonization surveillance program. It is not intended to diagnose MRSA infection nor to guide or monitor treatment for MRSA infections. Performed at [facility], [address], [place],  [address]          Radiology Studies: No results found.      Scheduled Meds: . apixaban  5 mg Oral BID  . Chlorhexidine Gluconate Cloth  6 each Topical Daily  . feeding supplement (PRO-STAT SUGAR FREE 64)  30 mL Oral BID BM  . finasteride  5 mg Oral [id number]  . gabapentin  800 mg Oral TID  . hydrocortisone cream   Topical BID  . latanoprost  1 drop Both Eyes QHS  . metoprolol tartrate  25 mg Oral BID  . mometasone-formoterol  2 puff Inhalation BID  . tamsulosin  0.4 mg Oral QHS   Continuous Infusions: . azithromycin 500 mg (02/10/19 2145)  . ceFEPime (MAXIPIME) IV    . lactated ringers 100 mL/hr at 02/11/19 0015     LOS: 3 days    Time spent: 30 minutes    Devani Odonnel Darleen Crocker, DO [facility] Pager [phone]  If 7PM-7AM, please contact night-coverage [email or web address] Password [email or web address] 02/11/2019, 11:08 AM

## 2019-02-12 LAB — COMPREHENSIVE METABOLIC PANEL
ALT: 272 U/L — ABNORMAL HIGH (ref 0–44)
AST: 77 U/L — ABNORMAL HIGH (ref 15–41)
Albumin: 2.1 g/dL — ABNORMAL LOW (ref 3.5–5.0)
Alkaline Phosphatase: 79 U/L (ref 38–126)
Anion gap: 9 (ref 5–15)
BUN: 24 mg/dL — ABNORMAL HIGH (ref 8–23)
CO2: 25 mmol/L (ref 22–32)
Calcium: 8.9 mg/dL (ref 8.9–10.3)
Chloride: 102 mmol/L (ref 98–111)
Creatinine, Ser: 0.96 mg/dL (ref 0.61–1.24)
GFR calc Af Amer: >60 mL/min (ref >60)
GFR calc non Af Amer: >60 mL/min (ref >60)
Glucose, Bld: 96 mg/dL (ref 70–99)
Potassium: 3.9 mmol/L (ref 3.5–5.1)
Sodium: 136 mmol/L (ref 135–145)
Total Bilirubin: 0.6 mg/dL (ref 0.3–1.2)
Total Protein: 6.2 g/dL — ABNORMAL LOW (ref 6.5–8.1)

## 2019-02-12 LAB — CBC
HCT: 33.3 % — ABNORMAL LOW (ref 39.0–52.0)
Hemoglobin: 9.9 g/dL — ABNORMAL LOW (ref 13.0–17.0)
MCH: 28.4 pg (ref 26.0–34.0)
MCHC: 29.7 g/dL — ABNORMAL LOW (ref 30.0–36.0)
MCV: 95.4 fL (ref 80.0–100.0)
Platelets: 324 10*3/uL (ref 150–400)
RBC: 3.49 MIL/uL — ABNORMAL LOW (ref 4.22–5.81)
RDW: 19.2 % — ABNORMAL HIGH (ref 11.5–15.5)
WBC: 14.8 10*3/uL — ABNORMAL HIGH (ref 4.0–10.5)
nRBC: 0.1 % (ref 0.0–0.2)

## 2019-02-12 MED ORDER — ALPRAZOLAM 1 MG PO TABS
2.0000 mg | ORAL_TABLET | Freq: Two times a day (BID) | ORAL | Status: DC
  Administered 2019-02-12 – 2019-02-14 (×5): 2 mg via ORAL
  Filled 2019-02-12 (×5): qty 2

## 2019-02-12 MED ORDER — AMITRIPTYLINE HCL 25 MG PO TABS
100.0000 mg | ORAL_TABLET | Freq: Every day | ORAL | Status: DC
  Administered 2019-02-12 – 2019-02-13 (×2): 100 mg via ORAL
  Filled 2019-02-12 (×2): qty 4

## 2019-02-12 MED ORDER — LORAZEPAM 2 MG/ML IJ SOLN
1.0000 mg | Freq: Once | INTRAMUSCULAR | Status: AC
  Administered 2019-02-12: 1 mg via INTRAVENOUS
  Filled 2019-02-12: qty 1

## 2019-02-12 MED ORDER — IPRATROPIUM-ALBUTEROL 0.5-2.5 (3) MG/3ML IN SOLN
3.0000 mL | Freq: Two times a day (BID) | RESPIRATORY_TRACT | Status: DC
  Administered 2019-02-13 – 2019-02-14 (×3): 3 mL via RESPIRATORY_TRACT
  Filled 2019-02-12 (×3): qty 3

## 2019-02-12 NOTE — Progress Notes (Addendum)
PROGRESS NOTE    Curtis [name]  [id number] DOB: July 16, 1945 DOA: 02/08/2019 PCP: [name], MD   Brief Narrative:  Per HPI: [name] a 73 y.o.malewith medical history significant ofAAA, osteoarthritis, asbestosis, CAD, history of an STEMI, cancer, depression, diverticulosis,/diabetic colitis, GERD, glaucoma, abdominal hernia, hyperlipidemia, hypertension, IBS, peripheral vascular disease, history of pneumonia, reflex sympathetic dystrophy, stage III and IV decubiti ulcers who is brought to the emergency department due to elevated d-dimer and decreased mentation. The patient has been getting IV antibiotics at the facility for pneumonia. He is unable to provide further information at this time.  10/7:Patient has been admitted with acute encephalopathy as well as elevated d-dimer and transaminitis with further studies currently pending.  10/8: Acute encephalopathy appears to be improving along with AKI. Will resume half dose of home gabapentin due to some lower extremity pain noted. Transaminitis is downtrending with hepatitis panel negative as well as abdominal ultrasound with no acute findings.  10/9: Patient appears to have eschars on heels and decubitus ulcer of sacrum for which we will have wound care nurse evaluate further.  He appears to be doing well from an acute kidney injury standpoint and appears to be more alert and oriented.  We will continue antibiotics for H CAP for now and check CT angiogram to ensure no PE today.  Anticipate discharge potentially to SNF over the weekend.  10/10: Patient has less confusion and acute kidney injury appears to have resolved.  Will discontinue IV fluid.  General surgery consultation appreciated for potential wound debridement of buttock decubitus ulcer.  Continue antibiotics for now.  CT angiogram with no evidence of PE.  Plan to discontinue Foley catheter if not needed.  Assessment & Plan:   Principal Problem:   Acute  hepatitis Active Problems:   GERD   AKI (acute kidney injury) ([place])   HTN (hypertension)   SVT (supraventricular tachycardia) s/p ablation 01/16/2014   HCAP (healthcare-associated pneumonia)   Pressure ulcer of right heel, stage 2 (HCC)   Acute metabolic encephalopathy likely secondary to polypharmacy in the setting of AKI-resolved -Patient appears to be improving with ammonia level of 28 and TSH of 2.1 -UA seems to demonstrate UTI with urine culture demonstrating minimal yeast which is likely from chronic Foley -Continue to monitor closely -Resume home Xanax and Elavil as well as full dose gabapentin  Panic attack -Ativan given overnight with resolution -Resume prior Xanax and Elavil  Elevated d-dimer -No sign of PE noted on CT angiogram  Signs of bilateral pleural plaques and bulky lymphadenopathy -Likely related to prior asbestos exposure -Signs of pulmonary hypertension also noted -Continue oxygen support  Acute kidney injury-improving -Continue IV fluids as ordered -Urine output not recorded and will need to be monitored closely -Avoid nephrotoxic agents -Repeat renal panel in a.m. -Check CK level-92  Transaminitis-resolving -Right upper quadrant ultrasound with gallbladder distention no cholelithiasis noted-no other acute findings -Acute hepatitis panel with no acute findings -Monitor CMP in a.m. -No abdominal pain or other signs of jaundice noted  HCAP -Continue on current antibioticsand discontinue vancomycin today since MRSA PCR negative -Monitor microbiology -Continue supplemental oxygen as well as duo nebs as needed -Robitussin prescribed for cough  GERD -Continue famotidine  Hypertension -Continue metoprolol 25mg  BID  SVT status post ablation 01/2014 -Continue apixaban and metoprolol  Pressure ulcer of bilateral heels stage II -Continue local wound care  Urinary retention -Foley catheter present on discharge 01/11/19 -Needs to follow up  with Urology  -Continue Flomax and  Finasteride  DVT prophylaxis:Apixaban Code Status:Partial code-no mechanical ventilation Family Communication:We will plan to contact son Disposition Plan:Continue on current antibiotics and discontinue IV fluid.  Resume prior home medications.  General surgery to evaluate for debridement.  Anticipate discharge back to [facility] in 1 to 2 days.   Consultants:  Gen Surg  Procedures:  None  Antimicrobials:  Anti-infectives (From admission, onward)   Start     Dose/Rate Route Frequency Ordered Stop   02/11/19 1400  ceFEPIme (MAXIPIME) 2 g in sodium chloride 0.9 % 100 mL IVPB     2 g 200 mL/hr over 30 Minutes Intravenous Every 8 hours 02/11/19 1049     02/10/19 0600  vancomycin (VANCOCIN) IVPB 1000 mg/200 mL premix  Status:  Discontinued     1,000 mg 200 mL/hr over 60 Minutes Intravenous Every 24 hours 02/09/19 0304 02/10/19 1048   02/09/19 0600  ceFEPIme (MAXIPIME) 2 g in sodium chloride 0.9 % 100 mL IVPB  Status:  Discontinued     2 g 200 mL/hr over 30 Minutes Intravenous Every 12 hours 02/09/19 0304 02/11/19 1049   02/09/19 0415  vancomycin (VANCOCIN) IVPB 1000 mg/200 mL premix     1,000 mg 200 mL/hr over 60 Minutes Intravenous  Once 02/09/19 0304 02/09/19 0633   02/09/19 0315  vancomycin (VANCOCIN) IVPB 1000 mg/200 mL premix     1,000 mg 200 mL/hr over 60 Minutes Intravenous  Once 02/09/19 0304 02/09/19 0510   02/09/19 0230  azithromycin (ZITHROMAX) 500 mg in sodium chloride 0.9 % 250 mL IVPB  Status:  Discontinued     500 mg 250 mL/hr over 60 Minutes Intravenous Daily at bedtime 02/09/19 0218 02/11/19 1117   02/08/19 2115  ceFEPIme (MAXIPIME) 1 g in sodium chloride 0.9 % 100 mL IVPB     1 g 200 mL/hr over 30 Minutes Intravenous  Once 02/08/19 2112 02/08/19 2330   02/08/19 2115  metroNIDAZOLE (FLAGYL) IVPB 500 mg     500 mg 100 mL/hr over 60 Minutes Intravenous  Once 02/08/19 2112 02/08/19 2234       Subjective: Patient  seen and evaluated today with no new acute complaints or concerns. No acute concerns or events noted overnight. He appears less confused today. He did have a panic attack overnight.  Objective: Vitals:   02/12/19 0623 02/12/19 0647 02/12/19 0700 02/12/19 0738  BP: (!) 148/73  134/82   Pulse: (!) 136  (!) 125   Resp: (!) 24  18   Temp: 98.1 F (36.7 C)  98.2 F (36.8 C)   TempSrc: Oral  Oral   SpO2: 93%  91% 92%  Weight:  101.8 kg      Intake/Output Summary (Last 24 hours) at 02/12/2019 1130 Last data filed at 02/12/2019 0900 Gross per 24 hour  Intake 2946.35 ml  Output --  Net 2946.35 ml   Filed Weights   02/08/19 2055 02/12/19 0647  Weight: 125.8 kg 101.8 kg    Examination:  General exam: Appears calm and comfortable  Respiratory system: Clear to auscultation. Respiratory effort normal.  Remains on 3 L nasal cannula oxygen. Cardiovascular system: S1 & S2 heard, RRR. No JVD, murmurs, rubs, gallops or clicks. No pedal edema. Gastrointestinal system: Abdomen is nondistended, soft and nontender. No organomegaly or masses felt. Normal bowel sounds heard. Central nervous system: Alert and oriented. No focal neurological deficits. Extremities: Symmetric 5 x 5 power. Skin: No rashes, lesions or ulcers Psychiatry: Judgement and insight appear normal. Mood & affect appropriate.  Foley with clear, yellow urine output noted    Data Reviewed: I have personally reviewed following labs and imaging studies  CBC: Recent Labs  Lab 02/08/19 1300 02/09/19 0930 02/10/19 0520 02/11/19 0422 02/12/19 0622  WBC 27.4* 16.2* 16.0* 11.6* 14.8*  NEUTROABS  --  12.6*  --   --   --   HGB 10.4* 9.7* 9.0* 9.1* 9.9*  HCT 34.0* 32.7* 30.1* 30.4* 33.3*  MCV 93.7 95.1 95.0 95.3 95.4  PLT 478* 347 329 295 0000000   Basic Metabolic Panel: Recent Labs  Lab 02/08/19 1300 02/09/19 0930 02/10/19 0520 02/11/19 0422 02/12/19 0622  NA 131* 133* 135 136 136  K 4.8 4.0 3.9 3.7 3.9  CL 92* 96* 100  103 102  CO2 27 27 27 27 25   GLUCOSE 201* 106* 90 96 96  BUN 36* 37* 34* 29* 24*  CREATININE 2.31* 2.14* 1.72* 1.21 0.96  CALCIUM 8.7* 8.1* 8.2* 8.3* 8.9   GFR: Estimated Creatinine Clearance: 83.4 mL/min (by C-G formula based on SCr of 0.96 mg/dL). Liver Function Tests: Recent Labs  Lab 02/08/19 1300 02/09/19 0930 02/10/19 0520 02/11/19 0422 02/12/19 0622  AST 1,485* 651* 209* 112* 77*  ALT 846* 801* 515* 352* 272*  ALKPHOS 88 74 74 72 79  BILITOT 0.5 0.5 0.3 0.5 0.6  PROT 6.3* 6.1* 5.9* 5.8* 6.2*  ALBUMIN 2.1* 2.0* 1.9* 1.9* 2.1*   Recent Labs  Lab 02/08/19 1545  LIPASE 22  AMYLASE 16*   Recent Labs  Lab 02/09/19 1453  AMMONIA 28   Coagulation Profile: Recent Labs  Lab 02/09/19 0930  INR 2.4*   Cardiac Enzymes: Recent Labs  Lab 02/09/19 1453  CKTOTAL 91   BNP (last 3 results) No results for input(s): PROBNP in the last 8760 hours. HbA1C: No results for input(s): HGBA1C in the last 72 hours. CBG: Recent Labs  Lab 02/10/19 2156  GLUCAP 101*   Lipid Profile: No results for input(s): CHOL, HDL, LDLCALC, TRIG, CHOLHDL, LDLDIRECT in the last 72 hours. Thyroid Function Tests: Recent Labs    02/09/19 1453  TSH 2.140   Anemia Panel: No results for input(s): VITAMINB12, FOLATE, FERRITIN, TIBC, IRON, RETICCTPCT in the last 72 hours. Sepsis Labs: Recent Labs  Lab 02/08/19 2129 02/09/19 0930  LATICACIDVEN 2.5* 1.0    Recent Results (from the past 240 hour(s))  C difficile quick scan w PCR reflex     Status: None   Collection Time: 02/08/19  9:41 AM   Specimen: STOOL  Result Value Ref Range Status   C Diff antigen NEGATIVE NEGATIVE Final   C Diff toxin NEGATIVE NEGATIVE Final   C Diff interpretation No C. difficile detected.  Final    Comment: Performed at [facility], [address], [place], [place] [address]  Urine Culture     Status: Abnormal   Collection Time: 02/08/19 10:40 PM   Specimen: Urine, Catheterized  Result Value Ref Range  Status   Specimen Description   Final    URINE, CATHETERIZED Performed at [facility], [address], [place], [place] [address]    Special Requests   Final    NONE Performed at [facility], [address], [place], [place] [address]    Culture 40,000 COLONIES/mL YEAST (A)  Final   Report Status 02/11/2019 FINAL  Final  SARS Coronavirus 2 [facility] order, Performed in [facility] hospital lab) Nasopharyngeal Nasopharyngeal Swab     Status: None   Collection Time: 02/08/19 11:33 PM   Specimen: Nasopharyngeal  Swab  Result Value Ref Range Status   SARS Coronavirus 2 NEGATIVE NEGATIVE Final    Comment: (NOTE) If result is NEGATIVE SARS-CoV-2 target nucleic acids are NOT DETECTED. The SARS-CoV-2 RNA is generally detectable in upper and lower  respiratory specimens during the acute phase of infection. The lowest  concentration of SARS-CoV-2 viral copies this assay can detect is 250  copies / mL. A negative result does not preclude SARS-CoV-2 infection  and should not be used as the sole basis for treatment or other  patient management decisions.  A negative result may occur with  improper specimen collection / handling, submission of specimen other  than nasopharyngeal swab, presence of viral mutation(s) within the  areas targeted by this assay, and inadequate number of viral copies  (<250 copies / mL). A negative result must be combined with clinical  observations, patient history, and epidemiological information. If result is POSITIVE SARS-CoV-2 target nucleic acids are DETECTED. The SARS-CoV-2 RNA is generally detectable in upper and lower  respiratory specimens dur ing the acute phase of infection.  Positive  results are indicative of active infection with SARS-CoV-2.  Clinical  correlation with patient history and other diagnostic information is  necessary to determine patient infection status.  Positive results do  not rule out bacterial infection or co-infection with other  viruses. If result is PRESUMPTIVE POSTIVE SARS-CoV-2 nucleic acids MAY BE PRESENT.   A presumptive positive result was obtained on the submitted specimen  and confirmed on repeat testing.  While 2019 novel coronavirus  (SARS-CoV-2) nucleic acids may be present in the submitted sample  additional confirmatory testing may be necessary for epidemiological  and / or clinical management purposes  to differentiate between  SARS-CoV-2 and other Sarbecovirus currently known to infect humans.  If clinically indicated additional testing with an alternate test  methodology [phone]) is advised. The SARS-CoV-2 RNA is generally  detectable in upper and lower respiratory sp ecimens during the acute  phase of infection. The expected result is Negative. Fact Sheet for Patients:  StrictlyIdeas.no Fact Sheet for Healthcare Providers: BankingDealers.co.za This test is not yet approved or cleared by the [place] FDA and has been authorized for detection and/or diagnosis of SARS-CoV-2 by FDA under an Emergency Use Authorization (EUA).  This EUA will remain in effect (meaning this test can be used) for the duration of the COVID-19 declaration under Section 564(b)(1) of the Act, 21 U.S.C. section 360bbb-3(b)(1), unless the authorization is terminated or revoked sooner. Performed at [facility], [address] [place], [place] [address]   MRSA PCR Screening     Status: None   Collection Time: 02/09/19 10:35 PM   Specimen: Urine, Catheterized; Nasopharyngeal  Result Value Ref Range Status   MRSA by PCR NEGATIVE NEGATIVE Final    Comment:        The GeneXpert MRSA Assay (FDA approved for NASAL specimens only), is one component of a comprehensive MRSA colonization surveillance program. It is not intended to diagnose MRSA infection nor to guide or monitor treatment for MRSA infections. Performed at [facility], [address], [place], [place]  [address]          Radiology Studies: Ct Angio Chest Pe W Or Wo Contrast  Result Date: 02/11/2019 CLINICAL DATA:  Shortness of breath EXAM: CT ANGIOGRAPHY CHEST WITH CONTRAST TECHNIQUE: Multidetector CT imaging of the chest was performed using the standard protocol during bolus administration of intravenous contrast. Multiplanar CT image reconstructions and MIPs were obtained to evaluate the  vascular anatomy. CONTRAST:  131mL OMNIPAQUE IOHEXOL 350 MG/ML SOLN COMPARISON:  Abdomen CT abdomen pelvis, 02/08/2019, CT chest, 04/24/2014 FINDINGS: Cardiovascular: Satisfactory opacification of the pulmonary arteries to the segmental level. No evidence of pulmonary embolism. Cardiomegaly with 3 vessel coronary artery calcifications and/or stents. Trace pericardial effusion. Enlargement of the main pulmonary artery up to 4.0 cm. Aortic atherosclerosis. Mediastinum/Nodes: Bulky mediastinal and hilar lymphadenopathy, similar to prior examination dated 04/24/2014, largest right paratracheal lymph nodes measuring 2.4 x 2.4 cm (series 4, image 25). Thyroid gland, trachea, and esophagus demonstrate no significant findings. Lungs/Pleura: There are large, bilateral calcified pleural plaques throughout. Small bilateral pleural effusions, generally loculated and chronic appearing. Upper Abdomen: Small volume ascites in the included upper abdomen. Musculoskeletal: No chest wall abnormality. No acute or significant osseous findings. Review of the MIP images confirms the above findings. IMPRESSION: 1.  Negative examination for pulmonary embolism. 2. There are large, bilateral calcified pleural plaques throughout. Small bilateral pleural effusions, generally loculated and chronic appearing. 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 3. Bulky mediastinal and hilar lymphadenopathy, similar to prior examination dated 04/24/2014, largest right paratracheal lymph nodes measuring 2.4 x 2.4 cm (series 4, image 25), of uncertain  significance given long-term stability, perhaps reactive to extensive pleural disease. 4.  Cardiomegaly and coronary artery disease. 5. Enlargement of the main pulmonary artery, as can be seen in pulmonary hypertension. 6.  Ascites in the included upper abdomen. 7.  Aortic Atherosclerosis (ICD10-I70.0). Electronically Signed   By: Eddie Candle M.D.   On: 02/11/2019 12:32        Scheduled Meds:  alprazolam  2 mg Oral BID   amitriptyline  100 mg Oral QHS   apixaban  5 mg Oral BID   Chlorhexidine Gluconate Cloth  6 each Topical Daily   collagenase   Topical Daily   feeding supplement (PRO-STAT SUGAR FREE 64)  30 mL Oral BID BM   finasteride  5 mg Oral [id number]   gabapentin  800 mg Oral TID   hydrocortisone cream   Topical BID   latanoprost  1 drop Both Eyes QHS   metoprolol tartrate  25 mg Oral BID   mometasone-formoterol  2 puff Inhalation BID   multivitamin-lutein  1 capsule Oral Daily   nutrition supplement (JUVEN)  1 packet Oral BID BM   Ensure Max Protein  11 oz Oral BID   saccharomyces boulardii  250 mg Oral BID   tamsulosin  0.4 mg Oral QHS   Continuous Infusions:  ceFEPime (MAXIPIME) IV 2 g (02/12/19 0654)     LOS: 4 days    Time spent: 30 minutes    Jensen Kilburg Darleen Crocker, DO [facility] Pager [phone]  If 7PM-7AM, please contact night-coverage [email or web address] Password [email or web address] 02/12/2019, 11:30 AM

## 2019-02-12 NOTE — Progress Notes (Signed)
Change in MEWS score. Patient declared a panic attack earlier. B/P and HR trending down. Physician notified. Elodia Florence RN 02/12/2019 @0750 

## 2019-02-13 DIAGNOSIS — L89154 Pressure ulcer of sacral region, stage 4: Secondary | ICD-10-CM

## 2019-02-13 DIAGNOSIS — L89152 Pressure ulcer of sacral region, stage 2: Secondary | ICD-10-CM

## 2019-02-13 LAB — CBC
HCT: 34.6 % — ABNORMAL LOW (ref 39.0–52.0)
Hemoglobin: 10.3 g/dL — ABNORMAL LOW (ref 13.0–17.0)
MCH: 28.5 pg (ref 26.0–34.0)
MCHC: 29.8 g/dL — ABNORMAL LOW (ref 30.0–36.0)
MCV: 95.8 fL (ref 80.0–100.0)
Platelets: 328 10*3/uL (ref 150–400)
RBC: 3.61 MIL/uL — ABNORMAL LOW (ref 4.22–5.81)
RDW: 19.8 % — ABNORMAL HIGH (ref 11.5–15.5)
WBC: 15.5 10*3/uL — ABNORMAL HIGH (ref 4.0–10.5)
nRBC: 0.3 % — ABNORMAL HIGH (ref 0.0–0.2)

## 2019-02-13 LAB — BASIC METABOLIC PANEL
Anion gap: 9 (ref 5–15)
BUN: 24 mg/dL — ABNORMAL HIGH (ref 8–23)
CO2: 26 mmol/L (ref 22–32)
Calcium: 9.1 mg/dL (ref 8.9–10.3)
Chloride: 103 mmol/L (ref 98–111)
Creatinine, Ser: 1.11 mg/dL (ref 0.61–1.24)
GFR calc Af Amer: >60 mL/min (ref >60)
GFR calc non Af Amer: >60 mL/min (ref >60)
Glucose, Bld: 123 mg/dL — ABNORMAL HIGH (ref 70–99)
Potassium: 4.7 mmol/L (ref 3.5–5.1)
Sodium: 138 mmol/L (ref 135–145)

## 2019-02-13 LAB — GLUCOSE, CAPILLARY
Glucose-Capillary: 110 mg/dL — ABNORMAL HIGH (ref 70–99)
Glucose-Capillary: 132 mg/dL — ABNORMAL HIGH (ref 70–99)
Glucose-Capillary: 102 mg/dL — ABNORMAL HIGH (ref 70–99)

## 2019-02-13 MED ORDER — DIPHENHYDRAMINE-ZINC ACETATE 2-0.1 % EX CREA
TOPICAL_CREAM | Freq: Two times a day (BID) | CUTANEOUS | Status: DC | PRN
  Filled 2019-02-13: qty 28

## 2019-02-13 MED ORDER — LACTATED RINGERS IV SOLN
INTRAVENOUS | Status: AC
  Administered 2019-02-13: 14:00:00 via INTRAVENOUS

## 2019-02-13 NOTE — Progress Notes (Signed)
Subjective: Patient resting comfortably.  Objective: Vital signs in last 24 hours: Temp:  [98.1 F (36.7 C)-98.6 F (37 C)] 98.2 F (36.8 C) (10/11 0537) Pulse Rate:  [104-139] 131 (10/11 0921) Resp:  [18-20] 20 (10/11 0537) BP: (115-160)/(73-94) 140/73 (10/11 0921) SpO2:  [72 %-100 %] 96 % (10/11 0748) Weight:  [102.5 kg] 102.5 kg (10/11 0628) Last BM Date: 02/13/19  Intake/Output from previous day: 10/10 0701 - 10/11 0700 In: 803.4 [P.O.:600; IV Piggyback:203.4] Out: 850 [Urine:850] Intake/Output this shift: Total I/O In: 240 [P.O.:240] Out: -   General appearance: no distress Skin: Sacral decubitus wound excised at bedside with a scalpel.  No abscess cavity found, though a culture swab was taken.  Resultant wound is 4 x 5 cm with a depth of 1 cm noted.  Subcutaneous fat tissue present and dusky but not necrotic.  No bone exposed.  Picture taken.    Media Information    Document Information  Photos    02/13/2019 10:57  Attached To:  Hospital Encounter on 02/08/19  Source Information  Aviva Signs, MD  Ap-Dept 300     Lab Results:  Recent Labs    02/12/19 0622 02/13/19 0653  WBC 14.8* 15.5*  HGB 9.9* 10.3*  HCT 33.3* 34.6*  PLT 324 328   BMET Recent Labs    02/12/19 0622 02/13/19 0653  NA 136 138  K 3.9 4.7  CL 102 103  CO2 25 26  GLUCOSE 96 123*  BUN 24* 24*  CREATININE 0.96 1.11  CALCIUM 8.9 9.1   PT/INR No results for input(s): LABPROT, INR in the last 72 hours.  Studies/Results: Ct Angio Chest Pe W Or Wo Contrast  Result Date: 02/11/2019 CLINICAL DATA:  Shortness of breath EXAM: CT ANGIOGRAPHY CHEST WITH CONTRAST TECHNIQUE: Multidetector CT imaging of the chest was performed using the standard protocol during bolus administration of intravenous contrast. Multiplanar CT image reconstructions and MIPs were obtained to evaluate the vascular anatomy. CONTRAST:  170mL OMNIPAQUE IOHEXOL 350 MG/ML SOLN COMPARISON:  Abdomen CT abdomen  pelvis, 02/08/2019, CT chest, 04/24/2014 FINDINGS: Cardiovascular: Satisfactory opacification of the pulmonary arteries to the segmental level. No evidence of pulmonary embolism. Cardiomegaly with 3 vessel coronary artery calcifications and/or stents. Trace pericardial effusion. Enlargement of the main pulmonary artery up to 4.0 cm. Aortic atherosclerosis. Mediastinum/Nodes: Bulky mediastinal and hilar lymphadenopathy, similar to prior examination dated 04/24/2014, largest right paratracheal lymph nodes measuring 2.4 x 2.4 cm (series 4, image 25). Thyroid gland, trachea, and esophagus demonstrate no significant findings. Lungs/Pleura: There are large, bilateral calcified pleural plaques throughout. Small bilateral pleural effusions, generally loculated and chronic appearing. Upper Abdomen: Small volume ascites in the included upper abdomen. Musculoskeletal: No chest wall abnormality. No acute or significant osseous findings. Review of the MIP images confirms the above findings. IMPRESSION: 1.  Negative examination for pulmonary embolism. 2. There are large, bilateral calcified pleural plaques throughout. Small bilateral pleural effusions, generally loculated and chronic appearing. 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 3. Bulky mediastinal and hilar lymphadenopathy, similar to prior examination dated 04/24/2014, largest right paratracheal lymph nodes measuring 2.4 x 2.4 cm (series 4, image 25), of uncertain significance given long-term stability, perhaps reactive to extensive pleural disease. 4.  Cardiomegaly and coronary artery disease. 5. Enlargement of the main pulmonary artery, as can be seen in pulmonary hypertension. 6.  Ascites in the included upper abdomen. 7.  Aortic Atherosclerosis (ICD10-I70.0). Electronically Signed   By: Eddie Candle M.D.   On: 02/11/2019 12:32  Anti-infectives: Anti-infectives (From admission, onward)   Start     Dose/Rate Route Frequency Ordered Stop   02/11/19  1400  ceFEPIme (MAXIPIME) 2 g in sodium chloride 0.9 % 100 mL IVPB     2 g 200 mL/hr over 30 Minutes Intravenous Every 8 hours 02/11/19 1049     02/10/19 0600  vancomycin (VANCOCIN) IVPB 1000 mg/200 mL premix  Status:  Discontinued     1,000 mg 200 mL/hr over 60 Minutes Intravenous Every 24 hours 02/09/19 0304 02/10/19 1048   02/09/19 0600  ceFEPIme (MAXIPIME) 2 g in sodium chloride 0.9 % 100 mL IVPB  Status:  Discontinued     2 g 200 mL/hr over 30 Minutes Intravenous Every 12 hours 02/09/19 0304 02/11/19 1049   02/09/19 0415  vancomycin (VANCOCIN) IVPB 1000 mg/200 mL premix     1,000 mg 200 mL/hr over 60 Minutes Intravenous  Once 02/09/19 0304 02/09/19 0633   02/09/19 0315  vancomycin (VANCOCIN) IVPB 1000 mg/200 mL premix     1,000 mg 200 mL/hr over 60 Minutes Intravenous  Once 02/09/19 0304 02/09/19 0510   02/09/19 0230  azithromycin (ZITHROMAX) 500 mg in sodium chloride 0.9 % 250 mL IVPB  Status:  Discontinued     500 mg 250 mL/hr over 60 Minutes Intravenous Daily at bedtime 02/09/19 0218 02/11/19 1117   02/08/19 2115  ceFEPIme (MAXIPIME) 1 g in sodium chloride 0.9 % 100 mL IVPB     1 g 200 mL/hr over 30 Minutes Intravenous  Once 02/08/19 2112 02/08/19 2330   02/08/19 2115  metroNIDAZOLE (FLAGYL) IVPB 500 mg     500 mg 100 mL/hr over 60 Minutes Intravenous  Once 02/08/19 2112 02/08/19 2234      Assessment/Plan: Imp: Stage II sacral decubitus ulcer. Plan: Normal saline wet-to-dry dressings.  LOS: 5 days    Aviva Signs 02/13/2019

## 2019-02-13 NOTE — Progress Notes (Addendum)
PROGRESS NOTE    Curtis [name]  [id number] DOB: 1945/10/19 DOA: 02/08/2019 PCP: [name], MD   Brief Narrative:  Per HPI: Curtis Cipro Malloyis a 73 y.o.malewith medical history significant ofAAA, osteoarthritis, asbestosis, CAD, history of an STEMI, cancer, depression, diverticulosis,/diabetic colitis, GERD, glaucoma, abdominal hernia, hyperlipidemia, hypertension, IBS, peripheral vascular disease, history of pneumonia, reflex sympathetic dystrophy, stage III and IV decubiti ulcers who is brought to the emergency department due to elevated d-dimer and decreased mentation. The patient has been getting IV antibiotics at the facility for pneumonia. He is unable to provide further information at this time.  10/7:Patient has been admitted with acute encephalopathy as well as elevated d-dimer and transaminitis with further studies currently pending.  10/8: Acute encephalopathy appears to be improving along with AKI. Will resume half dose of home gabapentin due to some lower extremity pain noted. Transaminitis is downtrending with hepatitis panel negative as well as abdominal ultrasound with no acute findings.  10/9:Patient appears to have eschars on heels and decubitus ulcer of sacrum for which we will have wound care nurse evaluate further. He appears to be doing well from an acute kidney injury standpoint and appears to be more alert and oriented. We will continue antibiotics for H CAP for now and check CT angiogram to ensure no PE today. Anticipate discharge potentially to SNF over the weekend.  10/10: Patient has less confusion and acute kidney injury appears to have resolved.  Will discontinue IV fluid.  General surgery consultation appreciated for potential wound debridement of buttock decubitus ulcer.  Continue antibiotics for now.  CT angiogram with no evidence of PE.  Plan to discontinue Foley catheter if not needed.  10/11: Patient overall appears to be less  confused and is sleeping well.  Appreciate general surgery for debridement of decubitus ulcer and will start wet-to-dry dressings.  Anticipate transfer back to SNF in a.m. with Foley catheter at which point antibiotics will be complete.  They appear to have been started on 10/2.  Assessment & Plan:   Principal Problem:   Acute hepatitis Active Problems:   GERD   AKI (acute kidney injury) ([place])   HTN (hypertension)   SVT (supraventricular tachycardia) s/p ablation 01/16/2014   HCAP (healthcare-associated pneumonia)   Pressure ulcer of right heel, stage 2 ([place])   Sacral decubitus ulcer, stage II ([place])   Acute metabolic encephalopathy likely secondary to polypharmacy in the setting of AKI-resolved -Patient appears to be improving with ammonia level of 28 and TSH of 2.1 -UA seems to demonstrate UTI with urine culture demonstrating minimal yeast which is likely from chronic Foley -Continue to monitor closely -Resume home Xanax and Elavil as well as full dose gabapentin -We will obtain brain MRI in a.m. to ensure that there has not been any new recent stroke or normal pressure hydrocephalus development as it appears that he was quite functional just 2 months prior.  Panic attack -Ativan given overnight with resolution -Resume prior Xanax and Elavil  Elevated d-dimer -No sign of PE noted on CT angiogram  Signs of bilateral pleural plaques and bulky lymphadenopathy -Likely related to prior asbestos exposure -Signs of pulmonary hypertension also noted -Continue oxygen support  Acute kidney injury- stable -Continue IV fluids as ordered for 12 more hours -Urine output not recorded and will need to be monitored closely -Avoid nephrotoxic agents -Repeat renal panel in a.m. -Check CK level-92  Transaminitis-resolving -Right upper quadrant ultrasound with gallbladder distention no cholelithiasis noted-no other acute findings -Acute hepatitis panel  with no acute findings -Monitor CMP  in a.m. -No abdominal pain or other signs of jaundice noted  HCAP -Continue on current antibioticsand discontinue vancomycin today since MRSA PCR negative -Monitor microbiology with no growth noted -DC all antibiotics by tomorrow and remove PICC line prior to discharge -Continue supplemental oxygen as well as duo nebs as needed -Robitussin prescribed for cough  GERD -Continue famotidine  Hypertension -Continue metoprolol 25mg  BID  SVT status post ablation 01/2014 -Continue apixaban and metoprolol  Pressure ulcer of bilateral heels stage II -Continue local wound care  Urinary retention -Foley catheter present on discharge 01/11/19 -Needs to follow up with Urology  -Continue Flomax and Finasteride  DVT prophylaxis:Apixaban Code Status:Partial code-no mechanical ventilation Family Communication:We will plan to contact son, discussed on 10/11 Disposition Plan:Continue on current antibiotics and discontinue IV fluid.  Resume prior home medications.  General surgery debrided left buttock wound today.  Anticipate discharge back to [facility] tomorrow.   Consultants:  Gen Surg  Procedures:  None  Antimicrobials:  Anti-infectives (From admission, onward)   Start     Dose/Rate Route Frequency Ordered Stop   02/11/19 1400  ceFEPIme (MAXIPIME) 2 g in sodium chloride 0.9 % 100 mL IVPB     2 g 200 mL/hr over 30 Minutes Intravenous Every 8 hours 02/11/19 1049     02/10/19 0600  vancomycin (VANCOCIN) IVPB 1000 mg/200 mL premix  Status:  Discontinued     1,000 mg 200 mL/hr over 60 Minutes Intravenous Every 24 hours 02/09/19 0304 02/10/19 1048   02/09/19 0600  ceFEPIme (MAXIPIME) 2 g in sodium chloride 0.9 % 100 mL IVPB  Status:  Discontinued     2 g 200 mL/hr over 30 Minutes Intravenous Every 12 hours 02/09/19 0304 02/11/19 1049   02/09/19 0415  vancomycin (VANCOCIN) IVPB 1000 mg/200 mL premix     1,000 mg 200 mL/hr over 60 Minutes Intravenous  Once 02/09/19 0304  02/09/19 0633   02/09/19 0315  vancomycin (VANCOCIN) IVPB 1000 mg/200 mL premix     1,000 mg 200 mL/hr over 60 Minutes Intravenous  Once 02/09/19 0304 02/09/19 0510   02/09/19 0230  azithromycin (ZITHROMAX) 500 mg in sodium chloride 0.9 % 250 mL IVPB  Status:  Discontinued     500 mg 250 mL/hr over 60 Minutes Intravenous Daily at bedtime 02/09/19 0218 02/11/19 1117   02/08/19 2115  ceFEPIme (MAXIPIME) 1 g in sodium chloride 0.9 % 100 mL IVPB     1 g 200 mL/hr over 30 Minutes Intravenous  Once 02/08/19 2112 02/08/19 2330   02/08/19 2115  metroNIDAZOLE (FLAGYL) IVPB 500 mg     500 mg 100 mL/hr over 60 Minutes Intravenous  Once 02/08/19 2112 02/08/19 2234       Subjective: Patient seen and evaluated today with no new acute complaints or concerns. No acute concerns or events noted overnight.  He says he slept fairly well and has no further anxiety today.  Objective: Vitals:   02/13/19 0537 02/13/19 0628 02/13/19 0748 02/13/19 0921  BP: 115/74   140/73  Pulse: (!) 109   (!) 131  Resp: 20     Temp: 98.2 F (36.8 C)     TempSrc: Oral     SpO2: (!) 72% 100% 96%   Weight:  102.5 kg      Intake/Output Summary (Last 24 hours) at 02/13/2019 1116 Last data filed at 02/13/2019 0858 Gross per 24 hour  Intake 803.39 ml  Output 850 ml  Net -46.61  ml   Filed Weights   02/08/19 2055 02/12/19 0647 02/13/19 0628  Weight: 125.8 kg 101.8 kg 102.5 kg    Examination:  General exam: Appears calm and comfortable  Respiratory system: Clear to auscultation. Respiratory effort normal.  Currently on 2 L nasal cannula. Cardiovascular system: S1 & S2 heard, RRR. No JVD, murmurs, rubs, gallops or clicks. No pedal edema. Gastrointestinal system: Abdomen is nondistended, soft and nontender. No organomegaly or masses felt. Normal bowel sounds heard. Central nervous system: Alert and oriented. No focal neurological deficits. Extremities: Symmetric 5 x 5 power. Skin: Heel decubitus ulcers as well as  left gluteus ulcers as documented. Psychiatry: Judgement and insight appear normal. Mood & affect appropriate.  Foley with clear, yellow urine output noted    Data Reviewed: I have personally reviewed following labs and imaging studies  CBC: Recent Labs  Lab 02/09/19 0930 02/10/19 0520 02/11/19 0422 02/12/19 0622 02/13/19 0653  WBC 16.2* 16.0* 11.6* 14.8* 15.5*  NEUTROABS 12.6*  --   --   --   --   HGB 9.7* 9.0* 9.1* 9.9* 10.3*  HCT 32.7* 30.1* 30.4* 33.3* 34.6*  MCV 95.1 95.0 95.3 95.4 95.8  PLT 347 329 295 324 [id number]   Basic Metabolic Panel: Recent Labs  Lab 02/09/19 0930 02/10/19 0520 02/11/19 0422 02/12/19 0622 02/13/19 0653  NA 133* 135 136 136 138  K 4.0 3.9 3.7 3.9 4.7  CL 96* 100 103 102 103  CO2 27 27 27 25 26   GLUCOSE 106* 90 96 96 123*  BUN 37* 34* 29* 24* 24*  CREATININE 2.14* 1.72* 1.21 0.96 1.11  CALCIUM 8.1* 8.2* 8.3* 8.9 9.1   GFR: Estimated Creatinine Clearance: 72.3 mL/min (by C-G formula based on SCr of 1.11 mg/dL). Liver Function Tests: Recent Labs  Lab 02/08/19 1300 02/09/19 0930 02/10/19 0520 02/11/19 0422 02/12/19 0622  AST 1,485* 651* 209* 112* 77*  ALT 846* 801* 515* 352* 272*  ALKPHOS 88 74 74 72 79  BILITOT 0.5 0.5 0.3 0.5 0.6  PROT 6.3* 6.1* 5.9* 5.8* 6.2*  ALBUMIN 2.1* 2.0* 1.9* 1.9* 2.1*   Recent Labs  Lab 02/08/19 1545  LIPASE 22  AMYLASE 16*   Recent Labs  Lab 02/09/19 1453  AMMONIA 28   Coagulation Profile: Recent Labs  Lab 02/09/19 0930  INR 2.4*   Cardiac Enzymes: Recent Labs  Lab 02/09/19 1453  CKTOTAL 91   BNP (last 3 results) No results for input(s): PROBNP in the last 8760 hours. HbA1C: No results for input(s): HGBA1C in the last 72 hours. CBG: Recent Labs  Lab 02/10/19 2156 02/13/19 0747 02/13/19 1112  GLUCAP 101* 110* 132*   Lipid Profile: No results for input(s): CHOL, HDL, LDLCALC, TRIG, CHOLHDL, LDLDIRECT in the last 72 hours. Thyroid Function Tests: No results for input(s): TSH,  T4TOTAL, FREET4, T3FREE, THYROIDAB in the last 72 hours. Anemia Panel: No results for input(s): VITAMINB12, FOLATE, FERRITIN, TIBC, IRON, RETICCTPCT in the last 72 hours. Sepsis Labs: Recent Labs  Lab 02/08/19 2129 02/09/19 0930  LATICACIDVEN 2.5* 1.0    Recent Results (from the past 240 hour(s))  C difficile quick scan w PCR reflex     Status: None   Collection Time: 02/08/19  9:41 AM   Specimen: STOOL  Result Value Ref Range Status   C Diff antigen NEGATIVE NEGATIVE Final   C Diff toxin NEGATIVE NEGATIVE Final   C Diff interpretation No C. difficile detected.  Final    Comment: Performed at [facility],  [address], [place], [place] [address]  Urine Culture     Status: Abnormal   Collection Time: 02/08/19 10:40 PM   Specimen: Urine, Catheterized  Result Value Ref Range Status   Specimen Description   Final    URINE, CATHETERIZED Performed at [facility], [address], [place], [place] [address]    Special Requests   Final    NONE Performed at [facility], [address] [place], [place] [address]    Culture 40,000 COLONIES/mL YEAST (A)  Final   Report Status 02/11/2019 FINAL  Final  SARS Coronavirus [address] [facility] order, Performed in [facility] hospital lab) Nasopharyngeal Nasopharyngeal Swab     Status: None   Collection Time: 02/08/19 11:33 PM   Specimen: Nasopharyngeal Swab  Result Value Ref Range Status   SARS Coronavirus 2 NEGATIVE NEGATIVE Final    Comment: (NOTE) If result is NEGATIVE SARS-CoV-2 target nucleic acids are NOT DETECTED. The SARS-CoV-2 RNA is generally detectable in upper and lower  respiratory specimens during the acute phase of infection. The lowest  concentration of SARS-CoV-2 viral copies this assay can detect is 250  copies / mL. A negative result does not preclude SARS-CoV-2 infection  and should not be used as the sole basis for treatment or other  patient management decisions.  A negative result may occur with  improper specimen  collection / handling, submission of specimen other  than nasopharyngeal swab, presence of viral mutation(s) within the  areas targeted by this assay, and inadequate number of viral copies  (<250 copies / mL). A negative result must be combined with clinical  observations, patient history, and epidemiological information. If result is POSITIVE SARS-CoV-2 target nucleic acids are DETECTED. The SARS-CoV-2 RNA is generally detectable in upper and lower  respiratory specimens dur ing the acute phase of infection.  Positive  results are indicative of active infection with SARS-CoV-2.  Clinical  correlation with patient history and other diagnostic information is  necessary to determine patient infection status.  Positive results do  not rule out bacterial infection or co-infection with other viruses. If result is PRESUMPTIVE POSTIVE SARS-CoV-2 nucleic acids MAY BE PRESENT.   A presumptive positive result was obtained on the submitted specimen  and confirmed on repeat testing.  While 2019 novel coronavirus  (SARS-CoV-2) nucleic acids may be present in the submitted sample  additional confirmatory testing may be necessary for epidemiological  and / or clinical management purposes  to differentiate between  SARS-CoV-2 and other Sarbecovirus currently known to infect humans.  If clinically indicated additional testing with an alternate test  methodology [phone]) is advised. The SARS-CoV-2 RNA is generally  detectable in upper and lower respiratory sp ecimens during the acute  phase of infection. The expected result is Negative. Fact Sheet for Patients:  StrictlyIdeas.no Fact Sheet for Healthcare Providers: BankingDealers.co.za This test is not yet approved or cleared by the [place] FDA and has been authorized for detection and/or diagnosis of SARS-CoV-2 by FDA under an Emergency Use Authorization (EUA).  This EUA will remain in effect  (meaning this test can be used) for the duration of the COVID-19 declaration under Section 564(b)(1) of the Act, 21 U.S.C. section 360bbb-3(b)(1), unless the authorization is terminated or revoked sooner. Performed at [facility], [address], [place], [place] [address]   MRSA PCR Screening     Status: None   Collection Time: 02/09/19 10:35 PM   Specimen: Urine, Catheterized; Nasopharyngeal  Result Value Ref Range Status   MRSA  by PCR NEGATIVE NEGATIVE Final    Comment:        The GeneXpert MRSA Assay (FDA approved for NASAL specimens only), is one component of a comprehensive MRSA colonization surveillance program. It is not intended to diagnose MRSA infection nor to guide or monitor treatment for MRSA infections. Performed at [facility], [address], [place], [place] [address]          Radiology Studies: Ct Angio Chest Pe W Or Wo Contrast  Result Date: 02/11/2019 CLINICAL DATA:  Shortness of breath EXAM: CT ANGIOGRAPHY CHEST WITH CONTRAST TECHNIQUE: Multidetector CT imaging of the chest was performed using the standard protocol during bolus administration of intravenous contrast. Multiplanar CT image reconstructions and MIPs were obtained to evaluate the vascular anatomy. CONTRAST:  180mL OMNIPAQUE IOHEXOL 350 MG/ML SOLN COMPARISON:  Abdomen CT abdomen pelvis, 02/08/2019, CT chest, 04/24/2014 FINDINGS: Cardiovascular: Satisfactory opacification of the pulmonary arteries to the segmental level. No evidence of pulmonary embolism. Cardiomegaly with 3 vessel coronary artery calcifications and/or stents. Trace pericardial effusion. Enlargement of the main pulmonary artery up to 4.0 cm. Aortic atherosclerosis. Mediastinum/Nodes: Bulky mediastinal and hilar lymphadenopathy, similar to prior examination dated 04/24/2014, largest right paratracheal lymph nodes measuring 2.4 x 2.4 cm (series 4, image 25). Thyroid gland, trachea, and esophagus demonstrate no significant findings.  Lungs/Pleura: There are large, bilateral calcified pleural plaques throughout. Small bilateral pleural effusions, generally loculated and chronic appearing. Upper Abdomen: Small volume ascites in the included upper abdomen. Musculoskeletal: No chest wall abnormality. No acute or significant osseous findings. Review of the MIP images confirms the above findings. IMPRESSION: 1.  Negative examination for pulmonary embolism. 2. There are large, bilateral calcified pleural plaques throughout. Small bilateral pleural effusions, generally loculated and chronic appearing. 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 3. Bulky mediastinal and hilar lymphadenopathy, similar to prior examination dated 04/24/2014, largest right paratracheal lymph nodes measuring 2.4 x 2.4 cm (series 4, image 25), of uncertain significance given long-term stability, perhaps reactive to extensive pleural disease. 4.  Cardiomegaly and coronary artery disease. 5. Enlargement of the main pulmonary artery, as can be seen in pulmonary hypertension. 6.  Ascites in the included upper abdomen. 7.  Aortic Atherosclerosis (ICD10-I70.0). Electronically Signed   By: Eddie Candle M.D.   On: 02/11/2019 12:32        Scheduled Meds: . alprazolam  2 mg Oral BID  . amitriptyline  100 mg Oral QHS  . apixaban  5 mg Oral BID  . Chlorhexidine Gluconate Cloth  6 each Topical Daily  . collagenase   Topical Daily  . feeding supplement (PRO-STAT SUGAR FREE 64)  30 mL Oral BID BM  . finasteride  5 mg Oral [id number]  . gabapentin  800 mg Oral TID  . hydrocortisone cream   Topical BID  . ipratropium-albuterol  3 mL Nebulization BID  . latanoprost  1 drop Both Eyes QHS  . metoprolol tartrate  25 mg Oral BID  . mometasone-formoterol  2 puff Inhalation BID  . multivitamin-lutein  1 capsule Oral Daily  . nutrition supplement (JUVEN)  1 packet Oral BID BM  . Ensure Max Protein  11 oz Oral BID  . saccharomyces boulardii  250 mg Oral BID  . tamsulosin   0.4 mg Oral QHS   Continuous Infusions: . ceFEPime (MAXIPIME) IV 2 g (02/13/19 [id number])     LOS: 5 days    Time spent: 30 minutes    Alanii Ramer Darleen Crocker, DO [facility] Pager [phone]  If 7PM-7AM, please contact night-coverage [email or web address] Password [email or web address] 02/13/2019, 11:16 AM

## 2019-02-14 ENCOUNTER — Non-Acute Institutional Stay (SKILLED_NURSING_FACILITY): Payer: Medicare Other | Admitting: Adult Health

## 2019-02-14 ENCOUNTER — Encounter: Payer: Self-pay | Admitting: Adult Health

## 2019-02-14 ENCOUNTER — Inpatient Hospital Stay
Admission: RE | Admit: 2019-02-14 | Discharge: 2019-04-04 | Disposition: A | Discharge status: Home / Self Care | Payer: Medicare Other | Source: Ambulatory Visit | Attending: Internal Medicine | Admitting: Internal Medicine

## 2019-02-14 ENCOUNTER — Inpatient Hospital Stay (HOSPITAL_COMMUNITY): Payer: Medicare Other

## 2019-02-14 ENCOUNTER — Ambulatory Visit: Payer: Medicare Other | Admitting: Physician Assistant

## 2019-02-14 DIAGNOSIS — D508 Other iron deficiency anemias: Secondary | ICD-10-CM

## 2019-02-14 DIAGNOSIS — R0602 Shortness of breath: Secondary | ICD-10-CM

## 2019-02-14 DIAGNOSIS — J441 Chronic obstructive pulmonary disease with (acute) exacerbation: Principal | ICD-10-CM

## 2019-02-14 DIAGNOSIS — G609 Hereditary and idiopathic neuropathy, unspecified: Secondary | ICD-10-CM

## 2019-02-14 DIAGNOSIS — I471 Supraventricular tachycardia: Secondary | ICD-10-CM

## 2019-02-14 DIAGNOSIS — I4892 Unspecified atrial flutter: Secondary | ICD-10-CM | POA: Diagnosis not present

## 2019-02-14 DIAGNOSIS — L89620 Pressure ulcer of left heel, unstageable: Secondary | ICD-10-CM

## 2019-02-14 DIAGNOSIS — K219 Gastro-esophageal reflux disease without esophagitis: Secondary | ICD-10-CM

## 2019-02-14 DIAGNOSIS — L89610 Pressure ulcer of right heel, unstageable: Secondary | ICD-10-CM

## 2019-02-14 DIAGNOSIS — L89320 Pressure ulcer of left buttock, unstageable: Secondary | ICD-10-CM

## 2019-02-14 DIAGNOSIS — R339 Retention of urine, unspecified: Secondary | ICD-10-CM

## 2019-02-14 DIAGNOSIS — E43 Unspecified severe protein-calorie malnutrition: Secondary | ICD-10-CM

## 2019-02-14 DIAGNOSIS — J449 Chronic obstructive pulmonary disease, unspecified: Secondary | ICD-10-CM | POA: Diagnosis not present

## 2019-02-14 DIAGNOSIS — R609 Edema, unspecified: Secondary | ICD-10-CM

## 2019-02-14 DIAGNOSIS — R5383 Other fatigue: Secondary | ICD-10-CM

## 2019-02-14 DIAGNOSIS — I5031 Acute diastolic (congestive) heart failure: Secondary | ICD-10-CM | POA: Diagnosis not present

## 2019-02-14 DIAGNOSIS — J9611 Chronic respiratory failure with hypoxia: Secondary | ICD-10-CM

## 2019-02-14 DIAGNOSIS — F411 Generalized anxiety disorder: Secondary | ICD-10-CM

## 2019-02-14 LAB — CBC
HCT: 32.9 % — ABNORMAL LOW (ref 39.0–52.0)
Hemoglobin: 9.8 g/dL — ABNORMAL LOW (ref 13.0–17.0)
MCH: 28.9 pg (ref 26.0–34.0)
MCHC: 29.8 g/dL — ABNORMAL LOW (ref 30.0–36.0)
MCV: 97.1 fL (ref 80.0–100.0)
Platelets: 312 10*3/uL (ref 150–400)
RBC: 3.39 MIL/uL — ABNORMAL LOW (ref 4.22–5.81)
RDW: 20 % — ABNORMAL HIGH (ref 11.5–15.5)
WBC: 12.5 10*3/uL — ABNORMAL HIGH (ref 4.0–10.5)
nRBC: 0.4 % — ABNORMAL HIGH (ref 0.0–0.2)

## 2019-02-14 LAB — COMPREHENSIVE METABOLIC PANEL
ALT: 134 U/L — ABNORMAL HIGH (ref 0–44)
AST: 18 U/L (ref 15–41)
Albumin: 2 g/dL — ABNORMAL LOW (ref 3.5–5.0)
Alkaline Phosphatase: 66 U/L (ref 38–126)
Anion gap: 8 (ref 5–15)
BUN: 25 mg/dL — ABNORMAL HIGH (ref 8–23)
CO2: 26 mmol/L (ref 22–32)
Calcium: 9.2 mg/dL (ref 8.9–10.3)
Chloride: 104 mmol/L (ref 98–111)
Creatinine, Ser: 0.95 mg/dL (ref 0.61–1.24)
GFR calc Af Amer: >60 mL/min (ref >60)
GFR calc non Af Amer: >60 mL/min (ref >60)
Glucose, Bld: 114 mg/dL — ABNORMAL HIGH (ref 70–99)
Potassium: 4.1 mmol/L (ref 3.5–5.1)
Sodium: 138 mmol/L (ref 135–145)
Total Bilirubin: 0.3 mg/dL (ref 0.3–1.2)
Total Protein: 5.9 g/dL — ABNORMAL LOW (ref 6.5–8.1)

## 2019-02-14 MED ORDER — ENSURE MAX PROTEIN PO LIQD: 11.0000 [oz_av] | Freq: Two times a day (BID) | ORAL | 0 refills | Status: DC

## 2019-02-14 NOTE — Discharge Summary (Signed)
Physician Discharge Summary  Curtis [name] [id number] DOB: [birth year] DOA: 02/08/2019  PCP: [name], MD  Admit date: 02/08/2019  Discharge date: 02/14/2019  Admitted From:SNF  Disposition:  [facility] SNF  Recommendations for Outpatient Follow-up:  1. Follow up with PCP in 1-2 weeks 2. Please establish urology follow-up for Foley catheter that needs to be addressed.  This has been present since 01/21/2019 and will at least require an exchange by 02/20/2019.  Apparently patient did have a urology follow-up that was canceled earlier. 3. Patient has completed all antibiotics for suspected HCAP and has received 10 days worth of antibiotics at this point.  PICC line will be removed. 4. Please continue to hold Lasix and use as needed for worsening edema. 5. Continue prior medications. 6. Please continue close wound monitoring to left buttock as well as bilateral heels.  Patient has had debridement of stage II gluteal ulcer and will require ongoing wet-to-dry dressings.  Home Health: None  Equipment/Devices: None  Discharge Condition: Stable  CODE STATUS: Partial code-no mechanical ventilation  Diet recommendation: Heart Healthy  Brief/Interim Summary: Per HPI: Curtis [name] a 73 y.o.malewith medical history significant ofAAA, osteoarthritis, asbestosis, CAD, history of an STEMI, cancer, depression, diverticulosis,/diabetic colitis, GERD, glaucoma, abdominal hernia, hyperlipidemia, hypertension, IBS, peripheral vascular disease, history of pneumonia, reflex sympathetic dystrophy, stage III and IV decubiti ulcers who is brought to the emergency department due to elevated d-dimer and decreased mentation. The patient has been getting IV antibiotics at the facility for pneumonia. He is unable to provide further information at this time.  10/7:Patient has been admitted with acute encephalopathy as well as elevated d-dimer and transaminitis with further studies currently  pending.  10/8: Acute encephalopathy appears to be improving along with AKI. Will resume half dose of home gabapentin due to some lower extremity pain noted. Transaminitis is downtrending with hepatitis panel negative as well as abdominal ultrasound with no acute findings.  10/9:Patient appears to have eschars on heels and decubitus ulcer of sacrum for which we will have wound care nurse evaluate further. He appears to be doing well from an acute kidney injury standpoint and appears to be more alert and oriented. We will continue antibiotics for H CAP for now and check CT angiogram to ensure no PE today. Anticipate discharge potentially to SNF over the weekend.  10/10:Patient has less confusion and acute kidney injury appears to have resolved. Will discontinue IV fluid. General surgery consultation appreciated for potential wound debridement of buttock decubitus ulcer. Continue antibiotics for now. CT angiogram with no evidence of PE. Plan to discontinue Foley catheter if not needed.  10/11: Patient overall appears to be less confused and is sleeping well.  Appreciate general surgery for debridement of decubitus ulcer and will start wet-to-dry dressings.  Anticipate transfer back to SNF in a.m. with Foley catheter at which point antibiotics will be complete.  They appear to have been started on 10/2.  10/12: Patient overall appears to be doing quite well and is receiving wet-to-dry dressings to the gluteal region with no issues or concerns.  Antibiotics have been discontinued at this point as he is completed his course of treatment.  He is stable for discharge at this point.  He did have brain MRI this morning to ensure that there were no other intracranial processes contributing to his confusion and this is without any acute abnormalities.  It appears at this point, the patient likely had polypharmacy related to acute kidney injury and this needs to  be closely monitored.  I will recommend  that he remain off of his Lasix unless needed for any worsening edema as his appetite levels are not consistent.  He will need close follow-up with urology regarding urinary retention issues and chronic Foley catheter use.  Discharge Diagnoses:  Principal Problem:   Acute hepatitis Active Problems:   GERD   AKI (acute kidney injury) ([place])   HTN (hypertension)   SVT (supraventricular tachycardia) s/p ablation 01/16/2014   HCAP (healthcare-associated pneumonia)   Pressure ulcer of right heel, stage 2 ([place])   Sacral decubitus ulcer, stage II ([place])  Principal discharge diagnosis: Acute toxic encephalopathy secondary to polypharmacy in the setting of AKI with dehydration.  Discharge Instructions  Discharge Instructions    Diet - low sodium heart healthy   Complete by: As directed    Increase activity slowly   Complete by: As directed      Allergies as of 02/14/2019      Reactions   Codeine Itching   Trileptal [oxcarbazepine]    Other reaction(s): Other (See Comments) Abnormal sodium levels Hyponatremia      Medication List    STOP taking these medications   cefTRIAXone  IVPB Commonly known as: ROCEPHIN   furosemide 40 MG tablet Commonly known as: LASIX   potassium chloride 10 MEQ tablet Commonly known as: KLOR-CON     TAKE these medications   alprazolam 2 MG tablet Commonly known as: XANAX Take 2 mg by mouth 2 (two) times daily.   amitriptyline 100 MG tablet Commonly known as: ELAVIL Take 100 mg by mouth at bedtime.   Anti-Itch lotion Generic drug: camphor-menthol Apply 1 application topically 2 (two) times daily as needed for itching. Sparingly   apixaban 2.5 MG Tabs tablet Commonly known as: ELIQUIS Take 5 mg by mouth 2 (two) times daily.   bisacodyl 5 MG EC tablet Commonly known as: DULCOLAX Take 5 mg by mouth daily as needed for mild constipation or moderate constipation.   budesonide-formoterol 160-4.5 MCG/ACT inhaler Commonly known as:  SYMBICORT Inhale 2 puffs into the lungs 2 (two) times daily.   docusate sodium 100 MG capsule Commonly known as: COLACE Take 100 mg by mouth daily as needed for mild constipation.   Ensure Max Protein Liqd Take 330 mLs (11 oz total) by mouth 2 (two) times daily.   feeding supplement (PRO-STAT SUGAR FREE 64) Liqd Take 30 mLs by mouth 2 (two) times daily between meals.   finasteride 5 MG tablet Commonly known as: PROSCAR Take 5 mg by mouth every morning.   gabapentin 800 MG tablet Commonly known as: NEURONTIN Take 1 tablet (800 mg total) by mouth 3 (three) times daily.   guaiFENesin 600 MG 12 hr tablet Commonly known as: MUCINEX Take 600 mg by mouth 2 (two) times daily.   ipratropium-albuterol 0.5-2.5 (3) MG/3ML Soln Commonly known as: DUONEB Take 3 mLs by nebulization every 4 (four) hours as needed (For wheezing and worsening shortness of breath or respiratory distress).   latanoprost 0.005 % ophthalmic solution Commonly known as: XALATAN Place 1 drop into both eyes at bedtime.   metoprolol tartrate 25 MG tablet Commonly known as: LOPRESSOR Take 25 mg by mouth 2 (two) times daily.   multivitamin with minerals tablet Take 1 tablet by mouth daily.   nitroGLYCERIN 0.4 MG SL tablet Commonly known as: NITROSTAT Place 1 tablet (0.4 mg total) under the tongue every 5 (five) minutes x 3 doses as needed for chest pain.   NON FORMULARY Diet:  Regular, NAS, Consistent Carbohydrate   ondansetron 4 MG tablet Commonly known as: ZOFRAN Take 4 mg by mouth every 6 (six) hours.   OXYGEN Oxygen @@ 3L/mn via [place] Special Instructions: Document O2 sat qshift. Every Shift Day, Evening, Night   Santyl ointment Generic drug: collagenase Apply 1 application topically daily. Apply per treatment orders   sodium chloride 0.9 % Soln Piggyback; - ; amt: 172mls/hr; intravenous  Special Instructions: Normal saline 0.9% at 100cc/hr X2 litres Every Shift Day, Evening, Night   tamsulosin  0.4 MG Caps capsule Commonly known as: FLOMAX Take 0.4 mg by mouth at bedtime.   Venelex Oint Apply 1 application topically See admin instructions. Apply to coccyx, sacrum, bilateral buttocks each shift and prn      Follow-up Information    [name], MD Follow up in 1 week(s).   Specialty: Family Medicine Contact information: [address] [place] [address] [phone]        Herminio Commons, MD .   Specialty: Cardiology Contact information: [place] [address] [phone]        urology. Schedule an appointment as soon as possible for a visit in 1 week(s).          Allergies  Allergen Reactions  . Codeine Itching  . Trileptal [Oxcarbazepine]     Other reaction(s): Other (See Comments) Abnormal sodium levels Hyponatremia    Consultations:  General surgery   Procedures/Studies: Ct Abdomen Pelvis Wo Contrast  Result Date: 02/08/2019 CLINICAL DATA:  Nausea, vomiting, and diarrhea. Abnormal LFTs. Acute renal failure. EXAM: CT ABDOMEN AND PELVIS WITHOUT CONTRAST TECHNIQUE: Multidetector CT imaging of the abdomen and pelvis was performed following the standard protocol without IV contrast. COMPARISON:  CT abdomen pelvis dated November 10, 2014. FINDINGS: Lower chest: No acute abnormality. Cardiomegaly. Chronic atelectasis and scarring in the lung basis with multiple calcified pleural plaques. Hepatobiliary: No focal liver abnormality is seen. No gallstones, gallbladder wall thickening, or biliary dilatation. Pancreas: Unremarkable. No pancreatic ductal dilatation or surrounding inflammatory changes. Spleen: Normal in size without focal abnormality. Adrenals/Urinary Tract: The adrenal glands are unremarkable. 1.9 cm right renal simple cyst, mildly enlarged since 2016. No renal calculi or hydronephrosis. The bladder is decompressed by Foley catheter. Stomach/Bowel: Prior Nissen fundoplication. Stomach is otherwise within normal limits. No bowel  wall thickening, distention, or surrounding inflammatory changes. Mild left-sided colonic diverticulosis. Normal appendix. Vascular/Lymphatic: Extensive aortoiliac atherosclerosis with unchanged 3.2 cm infrarenal abdominal aortic aneurysm. No enlarged abdominal or pelvic lymph nodes. Reproductive: Prostate is unremarkable. Other: New small ascites.  No pneumoperitoneum. Musculoskeletal: No acute or significant osseous findings. IMPRESSION: 1.  No acute intra-abdominal process. 2. New small ascites. 3. Unchanged 3.2 cm infrarenal abdominal aortic aneurysm. Recommend followup by ultrasound in 3 years. This recommendation follows ACR consensus guidelines: White Paper of the ACR Incidental Findings Committee II on Vascular Findings. J Am Coll Radiol 2013; 10:789-794. Aortic aneurysm NOS (ICD10-I71.9) 4.  Aortic atherosclerosis (ICD10-I70.0). Electronically Signed   By: Titus Dubin M.D.   On: 02/08/2019 22:48   Dg Chest 1 View  Result Date: 01/28/2019 CLINICAL DATA:  73 year old male with a history of cough malaise and fever EXAM: CHEST  1 VIEW COMPARISON:  January 01, 2019 FINDINGS: Cardiomediastinal silhouette unchanged in size and contour. Similar appearance of diffuse reticulonodular opacities of lungs, with similar appearance over the course of several comparison chest x-rays. There has been slight progression since the chest x-ray of October 2016. No pneumothorax. No acute  displaced fracture IMPRESSION: Similar appearance of reticulonodular opacities in the mid and lower lungs, which has progressed slowly from 2016 on chest x-ray exams. While the changes most likely represent chronic interstitial fibrosis/scarring, superimposed infection cannot be excluded radiographically. Electronically Signed   By: Corrie Mckusick D.O.   On: 01/28/2019 10:57   Dg Chest 2 View  Result Date: 02/04/2019 CLINICAL DATA:  COPD EXAM: CHEST - 2 VIEW COMPARISON:  01/28/2019 FINDINGS: Cardiomegaly. Calcified pleural plaques  bilaterally. There is hyperinflation of the lungs compatible with COPD. Reticulonodular opacities throughout the lungs, favor scarring, stable. No visible effusions or acute bony abnormality. IMPRESSION: Extensive bilateral calcified pleural plaques. Chronic increased markings throughout the lungs, favor scarring. No definite acute process. Electronically Signed   By: Rolm Baptise M.D.   On: 02/04/2019 14:51   Ct Angio Chest Pe W Or Wo Contrast  Result Date: 02/11/2019 CLINICAL DATA:  Shortness of breath EXAM: CT ANGIOGRAPHY CHEST WITH CONTRAST TECHNIQUE: Multidetector CT imaging of the chest was performed using the standard protocol during bolus administration of intravenous contrast. Multiplanar CT image reconstructions and MIPs were obtained to evaluate the vascular anatomy. CONTRAST:  140mL OMNIPAQUE IOHEXOL 350 MG/ML SOLN COMPARISON:  Abdomen CT abdomen pelvis, 02/08/2019, CT chest, 04/24/2014 FINDINGS: Cardiovascular: Satisfactory opacification of the pulmonary arteries to the segmental level. No evidence of pulmonary embolism. Cardiomegaly with 3 vessel coronary artery calcifications and/or stents. Trace pericardial effusion. Enlargement of the main pulmonary artery up to 4.0 cm. Aortic atherosclerosis. Mediastinum/Nodes: Bulky mediastinal and hilar lymphadenopathy, similar to prior examination dated 04/24/2014, largest right paratracheal lymph nodes measuring 2.4 x 2.4 cm (series 4, image 25). Thyroid gland, trachea, and esophagus demonstrate no significant findings. Lungs/Pleura: There are large, bilateral calcified pleural plaques throughout. Small bilateral pleural effusions, generally loculated and chronic appearing. Upper Abdomen: Small volume ascites in the included upper abdomen. Musculoskeletal: No chest wall abnormality. No acute or significant osseous findings. Review of the MIP images confirms the above findings. IMPRESSION: 1.  Negative examination for pulmonary embolism. 2. There are large,  bilateral calcified pleural plaques throughout. Small bilateral pleural effusions, generally loculated and chronic appearing. 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 3. Bulky mediastinal and hilar lymphadenopathy, similar to prior examination dated 04/24/2014, largest right paratracheal lymph nodes measuring 2.4 x 2.4 cm (series 4, image 25), of uncertain significance given long-term stability, perhaps reactive to extensive pleural disease. 4.  Cardiomegaly and coronary artery disease. 5. Enlargement of the main pulmonary artery, as can be seen in pulmonary hypertension. 6.  Ascites in the included upper abdomen. 7.  Aortic Atherosclerosis (ICD10-I70.0). Electronically Signed   By: Eddie Candle M.D.   On: 02/11/2019 12:32   Mr Brain Wo Contrast  Result Date: 02/14/2019 CLINICAL DATA:  Altered level of consciousness EXAM: MRI HEAD WITHOUT CONTRAST TECHNIQUE: Multiplanar, multiecho pulse sequences of the brain and surrounding structures were obtained without intravenous contrast. COMPARISON:  09/07/2013 FINDINGS: Brain: No acute infarction, hemorrhage, hydrocephalus, or masslike finding. Mild asymmetric accumulation of CSF along the left cerebellum, stable. Mild to moderate chronic small vessel ischemic type change in the deep cerebral white matter, not significantly progressed although there has been an interval lacunar infarct at the left caudate. Brain atrophy that is generalized and shows only mild progression from 2015. Vascular: Preserved flow voids. Skull and upper cervical spine: No evidence of marrow lesion Sinuses/Orbits: Negative Other: Significant and progressive motion degradation. Diffusion imaging is diagnostic IMPRESSION: 1. Significant and progressive motion degradation. 2. Diffusion imaging is diagnostic  and negative for acute infarct. 3. Senescent changes without significant progression since 2015. Electronically Signed   By: Monte Fantasia M.D.   On: 02/14/2019 11:13   Dg  Chest Port 1 View  Result Date: 02/08/2019 CLINICAL DATA:  Cough, elevated D-dimer EXAM: PORTABLE CHEST 1 VIEW COMPARISON:  February 04, 2019 FINDINGS: There is cardiomegaly. Increased reticulonodular opacities are again noted throughout both lungs. Calcified pleural plaques are again noted. A right-sided PICC catheter is seen with the tip just entering the upper SVC. No pneumothorax. No acute osseous abnormality. IMPRESSION: Unchanged reticulonodular opacities throughout both lungs, likely due to chronic lung changes, however cannot exclude superimposed infectious etiology. Electronically Signed   By: Prudencio Pair M.D.   On: 02/08/2019 22:28   US Abdomen Limited Ruq  Result Date: 02/09/2019 CLINICAL DATA:  Acute hepatitis. EXAM: ULTRASOUND ABDOMEN LIMITED RIGHT UPPER QUADRANT COMPARISON:  CT scan of February 08, 2019. FINDINGS: Gallbladder: Gallbladder distention is noted without cholelithiasis or gallbladder wall thickening. No pericholecystic fluid is noted. The absence or presence of sonographic Murphy's sign was not reported by the technologist. Common bile duct: Diameter: 4 mm which is within normal limits. Liver: No focal lesion identified. Within normal limits in parenchymal echogenicity. Portal vein is patent on color Doppler imaging with normal direction of blood flow towards the liver. Other: None. IMPRESSION: Gallbladder distention is noted without cholelithiasis. No gallbladder wall thickening or pericholecystic fluid is noted. No other abnormality seen in the right upper quadrant of the abdomen. Electronically Signed   By: Marijo Conception M.D.   On: 02/09/2019 08:59     Discharge Exam: Vitals:   02/14/19 0535 02/14/19 0824  BP: 120/70   Pulse: 99   Resp: 16   Temp: 98.1 F (36.7 C)   SpO2: 92% 94%   Vitals:   02/13/19 2130 02/14/19 0435 02/14/19 0535 02/14/19 0824  BP: (!) 159/87  120/70   Pulse: (!) 104  99   Resp: 17  16   Temp: 98.1 F (36.7 C)  98.1 F (36.7 C)   TempSrc:  Oral     SpO2: 95%  92% 94%  Weight:  101.2 kg      General: Pt is alert, awake, not in acute distress Cardiovascular: RRR, S1/S2 +, no rubs, no gallops Respiratory: CTA bilaterally, no wheezing, no rhonchi, currently on 3 L nasal cannula oxygen Abdominal: Soft, NT, ND, bowel sounds + Extremities: no edema, no cyanosis    The results of significant diagnostics from this hospitalization (including imaging, microbiology, ancillary and laboratory) are listed below for reference.     Microbiology: Recent Results (from the past 240 hour(s))  C difficile quick scan w PCR reflex     Status: None   Collection Time: 02/08/19  9:41 AM   Specimen: STOOL  Result Value Ref Range Status   C Diff antigen NEGATIVE NEGATIVE Final   C Diff toxin NEGATIVE NEGATIVE Final   C Diff interpretation No C. difficile detected.  Final    Comment: Performed at [facility], [address] [place], [place] [address]  Urine Culture     Status: Abnormal   Collection Time: 02/08/19 10:40 PM   Specimen: Urine, Catheterized  Result Value Ref Range Status   Specimen Description   Final    URINE, CATHETERIZED Performed at [facility], [address] [place], [place] [address]    Special Requests   Final    NONE Performed at [facility], [address], [place], [place] [address]  Culture 40,000 COLONIES/mL YEAST (A)  Final   Report Status 02/11/2019 FINAL  Final  SARS Coronavirus 2 [facility] order, Performed in [facility] hospital lab) Nasopharyngeal Nasopharyngeal Swab     Status: None   Collection Time: 02/08/19 11:33 PM   Specimen: Nasopharyngeal Swab  Result Value Ref Range Status   SARS Coronavirus 2 NEGATIVE NEGATIVE Final    Comment: (NOTE) If result is NEGATIVE SARS-CoV-2 target nucleic acids are NOT DETECTED. The SARS-CoV-2 RNA is generally detectable in upper and lower  respiratory specimens during the acute phase of infection. The lowest  concentration of SARS-CoV-2 viral copies this  assay can detect is 250  copies / mL. A negative result does not preclude SARS-CoV-2 infection  and should not be used as the sole basis for treatment or other  patient management decisions.  A negative result may occur with  improper specimen collection / handling, submission of specimen other  than nasopharyngeal swab, presence of viral mutation(s) within the  areas targeted by this assay, and inadequate number of viral copies  (<250 copies / mL). A negative result must be combined with clinical  observations, patient history, and epidemiological information. If result is POSITIVE SARS-CoV-2 target nucleic acids are DETECTED. The SARS-CoV-2 RNA is generally detectable in upper and lower  respiratory specimens dur ing the acute phase of infection.  Positive  results are indicative of active infection with SARS-CoV-2.  Clinical  correlation with patient history and other diagnostic information is  necessary to determine patient infection status.  Positive results do  not rule out bacterial infection or co-infection with other viruses. If result is PRESUMPTIVE POSTIVE SARS-CoV-2 nucleic acids MAY BE PRESENT.   A presumptive positive result was obtained on the submitted specimen  and confirmed on repeat testing.  While 2019 novel coronavirus  (SARS-CoV-2) nucleic acids may be present in the submitted sample  additional confirmatory testing may be necessary for epidemiological  and / or clinical management purposes  to differentiate between  SARS-CoV-2 and other Sarbecovirus currently known to infect humans.  If clinically indicated additional testing with an alternate test  methodology [phone]) is advised. The SARS-CoV-2 RNA is generally  detectable in upper and lower respiratory sp ecimens during the acute  phase of infection. The expected result is Negative. Fact Sheet for Patients:  StrictlyIdeas.no Fact Sheet for Healthcare  Providers: BankingDealers.co.za This test is not yet approved or cleared by the [place] FDA and has been authorized for detection and/or diagnosis of SARS-CoV-2 by FDA under an Emergency Use Authorization (EUA).  This EUA will remain in effect (meaning this test can be used) for the duration of the COVID-19 declaration under Section 564(b)(1) of the Act, 21 U.S.C. section 360bbb-3(b)(1), unless the authorization is terminated or revoked sooner. Performed at [facility], [address] [place], [place] [address]   MRSA PCR Screening     Status: None   Collection Time: 02/09/19 10:35 PM   Specimen: Urine, Catheterized; Nasopharyngeal  Result Value Ref Range Status   MRSA by PCR NEGATIVE NEGATIVE Final    Comment:        The GeneXpert MRSA Assay (FDA approved for NASAL specimens only), is one component of a comprehensive MRSA colonization surveillance program. It is not intended to diagnose MRSA infection nor to guide or monitor treatment for MRSA infections. Performed at [facility], [address], [place], [place] [address]   Aerobic Culture (superficial specimen)     Status: None (Preliminary result)   Collection Time:  02/13/19 11:30 AM   Specimen: Skin, Cyst/Tag/Debridement  Result Value Ref Range Status   Specimen Description   Final    SKIN Performed at [facility], [address] [place], [place] [address]    Special Requests   Final    Immunocompromised Performed at [facility], [address] [place], [place] [address]    Gram Stain   Final    NO WBC SEEN RARE GRAM POSITIVE COCCI IN PAIRS Performed at [facility], [place] [address] [place], [place] [address]    Culture PENDING  Incomplete   Report Status PENDING  Incomplete     Labs: BNP (last 3 results) Recent Labs    01/01/19 2327  BNP [id number]*   Basic Metabolic Panel: Recent Labs  Lab 02/10/19 0520 02/11/19 0422 02/12/19 0622 02/13/19 0653 02/14/19 0509  NA  135 136 136 138 138  K 3.9 3.7 3.9 4.7 4.1  CL 100 103 102 103 104  CO2 27 27 25 26 26   GLUCOSE 90 96 96 123* 114*  BUN 34* 29* 24* 24* 25*  CREATININE 1.72* 1.21 0.96 1.11 0.95  CALCIUM 8.2* 8.3* 8.9 9.1 9.2   Liver Function Tests: Recent Labs  Lab 02/09/19 0930 02/10/19 0520 02/11/19 0422 02/12/19 0622 02/14/19 0509  AST 651* 209* 112* 77* 18  ALT 801* 515* 352* 272* 134*  ALKPHOS 74 74 72 79 66  BILITOT 0.5 0.3 0.5 0.6 0.3  PROT 6.1* 5.9* 5.8* 6.2* 5.9*  ALBUMIN 2.0* 1.9* 1.9* 2.1* 2.0*   Recent Labs  Lab 02/08/19 1545  LIPASE 22  AMYLASE 16*   Recent Labs  Lab 02/09/19 1453  AMMONIA 28   CBC: Recent Labs  Lab 02/09/19 0930 02/10/19 0520 02/11/19 0422 02/12/19 0622 02/13/19 0653 02/14/19 0509  WBC 16.2* 16.0* 11.6* 14.8* 15.5* 12.5*  NEUTROABS 12.6*  --   --   --   --   --   HGB 9.7* 9.0* 9.1* 9.9* 10.3* 9.8*  HCT 32.7* 30.1* 30.4* 33.3* 34.6* 32.9*  MCV 95.1 95.0 95.3 95.4 95.8 97.1  PLT 347 329 295 324 328 312   Cardiac Enzymes: Recent Labs  Lab 02/09/19 1453  CKTOTAL 91   BNP: Invalid input(s): POCBNP CBG: Recent Labs  Lab 02/10/19 2156 02/13/19 0747 02/13/19 1112 02/13/19 1605  GLUCAP 101* 110* 132* 102*   D-Dimer No results for input(s): DDIMER in the last 72 hours. Hgb A1c No results for input(s): HGBA1C in the last 72 hours. Lipid Profile No results for input(s): CHOL, HDL, LDLCALC, TRIG, CHOLHDL, LDLDIRECT in the last 72 hours. Thyroid function studies No results for input(s): TSH, T4TOTAL, T3FREE, THYROIDAB in the last 72 hours.  Invalid input(s): FREET3 Anemia work up No results for input(s): VITAMINB12, FOLATE, FERRITIN, TIBC, IRON, RETICCTPCT in the last 72 hours. Urinalysis    Component Value Date/Time   COLORURINE YELLOW 02/08/2019 2240   APPEARANCEUR CLOUDY (A) 02/08/2019 2240   LABSPEC 1.017 02/08/2019 2240   PHURINE 5.0 02/08/2019 2240   GLUCOSEU NEGATIVE 02/08/2019 2240   HGBUR MODERATE (A) 02/08/2019 2240    BILIRUBINUR NEGATIVE 02/08/2019 2240   KETONESUR NEGATIVE 02/08/2019 2240   PROTEINUR 100 (A) 02/08/2019 2240   UROBILINOGEN 0.2 02/12/2015 1859   NITRITE NEGATIVE 02/08/2019 2240   LEUKOCYTESUR LARGE (A) 02/08/2019 2240   Sepsis Labs Invalid input(s): PROCALCITONIN,  WBC,  LACTICIDVEN Microbiology Recent Results (from the past 240 hour(s))  C difficile quick scan w PCR reflex     Status: None  Collection Time: 02/08/19  9:41 AM   Specimen: STOOL  Result Value Ref Range Status   C Diff antigen NEGATIVE NEGATIVE Final   C Diff toxin NEGATIVE NEGATIVE Final   C Diff interpretation No C. difficile detected.  Final    Comment: Performed at [facility], [address], [place], [place] [address]  Urine Culture     Status: Abnormal   Collection Time: 02/08/19 10:40 PM   Specimen: Urine, Catheterized  Result Value Ref Range Status   Specimen Description   Final    URINE, CATHETERIZED Performed at [facility], [address] [place], [place] [address]    Special Requests   Final    NONE Performed at [facility], [address] [place], [place] [address]    Culture 40,000 COLONIES/mL YEAST (A)  Final   Report Status 02/11/2019 FINAL  Final  SARS Coronavirus 2 [facility] order, Performed in [facility] hospital lab) Nasopharyngeal Nasopharyngeal Swab     Status: None   Collection Time: 02/08/19 11:33 PM   Specimen: Nasopharyngeal Swab  Result Value Ref Range Status   SARS Coronavirus 2 NEGATIVE NEGATIVE Final    Comment: (NOTE) If result is NEGATIVE SARS-CoV-2 target nucleic acids are NOT DETECTED. The SARS-CoV-2 RNA is generally detectable in upper and lower  respiratory specimens during the acute phase of infection. The lowest  concentration of SARS-CoV-2 viral copies this assay can detect is 250  copies / mL. A negative result does not preclude SARS-CoV-2 infection  and should not be used as the sole basis for treatment or other  patient management decisions.  A negative  result may occur with  improper specimen collection / handling, submission of specimen other  than nasopharyngeal swab, presence of viral mutation(s) within the  areas targeted by this assay, and inadequate number of viral copies  (<250 copies / mL). A negative result must be combined with clinical  observations, patient history, and epidemiological information. If result is POSITIVE SARS-CoV-2 target nucleic acids are DETECTED. The SARS-CoV-2 RNA is generally detectable in upper and lower  respiratory specimens dur ing the acute phase of infection.  Positive  results are indicative of active infection with SARS-CoV-2.  Clinical  correlation with patient history and other diagnostic information is  necessary to determine patient infection status.  Positive results do  not rule out bacterial infection or co-infection with other viruses. If result is PRESUMPTIVE POSTIVE SARS-CoV-2 nucleic acids MAY BE PRESENT.   A presumptive positive result was obtained on the submitted specimen  and confirmed on repeat testing.  While 2019 novel coronavirus  (SARS-CoV-2) nucleic acids may be present in the submitted sample  additional confirmatory testing may be necessary for epidemiological  and / or clinical management purposes  to differentiate between  SARS-CoV-2 and other Sarbecovirus currently known to infect humans.  If clinically indicated additional testing with an alternate test  methodology [phone]) is advised. The SARS-CoV-2 RNA is generally  detectable in upper and lower respiratory sp ecimens during the acute  phase of infection. The expected result is Negative. Fact Sheet for Patients:  StrictlyIdeas.no Fact Sheet for Healthcare Providers: BankingDealers.co.za This test is not yet approved or cleared by the [place] FDA and has been authorized for detection and/or diagnosis of SARS-CoV-2 by FDA under an Emergency Use Authorization  (EUA).  This EUA will remain in effect (meaning this test can be used) for the duration of the COVID-19 declaration under Section 564(b)(1) of the Act, 21 U.S.C. section 360bbb-3(b)(1),  unless the authorization is terminated or revoked sooner. Performed at [facility], [address] [place], [place] [address]   MRSA PCR Screening     Status: None   Collection Time: 02/09/19 10:35 PM   Specimen: Urine, Catheterized; Nasopharyngeal  Result Value Ref Range Status   MRSA by PCR NEGATIVE NEGATIVE Final    Comment:        The GeneXpert MRSA Assay (FDA approved for NASAL specimens only), is one component of a comprehensive MRSA colonization surveillance program. It is not intended to diagnose MRSA infection nor to guide or monitor treatment for MRSA infections. Performed at [facility], [address], [place], [place] [address]   Aerobic Culture (superficial specimen)     Status: None (Preliminary result)   Collection Time: 02/13/19 11:30 AM   Specimen: Skin, Cyst/Tag/Debridement  Result Value Ref Range Status   Specimen Description   Final    SKIN Performed at [facility], [address], [place], [place] [address]    Special Requests   Final    Immunocompromised Performed at [facility], [address] [place], [place] [address]    Gram Stain   Final    NO WBC SEEN RARE GRAM POSITIVE COCCI IN PAIRS Performed at [facility], [place] [address], [place], [place] [address]    Culture PENDING  Incomplete   Report Status PENDING  Incomplete     Time coordinating discharge: 40 minutes  SIGNED:   Rodena Goldmann, DO [facility] 02/14/2019, 12:36 PM  If 7PM-7AM, please contact night-coverage [email or web address] Password [email or web address]

## 2019-02-14 NOTE — TOC Progression Note (Signed)
Transition of Care [facility]) - Progression Note    Patient Details  Name: Curtis [name] MRN: [id number] Date of Birth: 1945-11-16  Transition [facility]) CM/SW Contact  Shade Flood, LCSW Phone Number: 02/14/2019, 3:09 PM  Clinical Narrative:     Pt transitioning back to [facility] this afternoon. DC clinical sent electronically. Updated pt' son by phone.  Expected Discharge Plan: Skilled Nursing Facility Barriers to Discharge: No Barriers Identified  Expected Discharge Plan and Services Expected Discharge Plan: [place]   Discharge Planning Services: CM Consult   Living arrangements for the past 2 months: [place] Expected Discharge Date: 02/14/19                                     Social Determinants of Health (SDOH) Interventions    Readmission Risk Interventions Readmission Risk Prevention Plan 02/11/2019 01/04/2019 12/19/2018  Transportation Screening Complete Complete Complete  PCP or Specialist Appt within 5-7 Days - - Complete  PCP or Specialist Appt within 3-5 Days - Complete -  Home Care Screening - - Complete  Medication Review (RN CM) - - Complete  [place] or [place] - Complete -  Social Work Consult for Recovery Care Planning/Counseling - Complete -  Palliative Care Screening - Not Applicable -  Medication Review Press photographer) Complete Complete -  PCP or Specialist appointment within 3-5 days of discharge Not Complete - -  PCP/Specialist Appt Not Complete comments Pt returning to SNF - -  [place] or [place] Not Complete - -  [place] or [place] Pt Refusal Comments Pt returning to SNF - -  SW Recovery Care/Counseling Consult Complete - -  Palliative Care Screening Not Applicable - -  [place] Complete - -  Some recent data might be hidden

## 2019-02-14 NOTE — Care Management Important Message (Signed)
Important Message  Patient Details  Name: Curtis [name] MRN: [id number] Date of Birth: 1946-01-22   Medicare Important Message Given:  Yes     Tommy Medal 02/14/2019, 2:53 PM

## 2019-02-14 NOTE — Plan of Care (Signed)

## 2019-02-14 NOTE — Progress Notes (Signed)
Location:    [place] Room Number: 157/D Place of Service:  SNF (31)   CODE STATUS: Full Code  Allergies  Allergen Reactions  . Codeine Itching  . Trileptal [Oxcarbazepine]     Other reaction(s): Other (See Comments) Abnormal sodium levels Hyponatremia    Chief Complaint  Patient presents with  . Hospitalization Follow-up    Hospitalization Follow Up     HPI:    Past Medical History:  Diagnosis Date  . AAA (abdominal aortic aneurysm), stable 04/20/2014  . Arthritis   . 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 CAD (coronary artery disease)    a. s/p cath in 2015 showing occluded RCA and high-grade LCx stenosis treated with DESx2 to LCx  . Cancer ([place])   . Depression   . Diverticulitis    hospital 2011 [facility]  . Diverticulosis   . GERD (gastroesophageal reflux disease)   . 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 Hernia of unspecified site of abdominal cavity without mention of obstruction or gangrene    hiatal  . Hyperlipidemia LDL goal <70 04/20/2014  . Hypertension   . IBS (irritable bowel syndrome)   . Myocardial infarction ([place]) 01/2014   NSTEMI  . Peripheral vascular disease (H[facility])    ankle brachial index of 0.79 on the right and 0.61 on the left.   . Pneumonia   . 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 Reflex sympathetic dystrophy   . 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 SOB (shortness of breath)     Past Surgical History:  Procedure Laterality Date  . CARDIAC CATHETERIZATION    . CATARACT EXTRACTION    . CORONARY ANGIOPLASTY    . HERNIA REPAIR    . LEFT HEART CATHETERIZATION WITH CORONARY ANGIOGRAM N/A 01/17/2014   Procedure: LEFT HEART CATHETERIZATION WITH CORONARY ANGIOGRAM;  Surgeon: Leonie Man, MD;  Location: [facility] C[facility] LAB;  Service: Cardiovascular;  Laterality: N/A;  . left shoulder      x 3  . NISSEN FUNDOPLICATION    . right elbow surgery     x 2  . right knee arthroscopy    . squamous cell skin cancer     Left Hand  .  SUPRAVENTRICULAR TACHYCARDIA ABLATION N/A 01/16/2014   Procedure: SUPRAVENTRICULAR TACHYCARDIA ABLATION;  Surgeon: Evans Lance, MD;  Location: [facility] CATH LAB;  Service: Cardiovascular;  Laterality: N/A;    Social History   Socioeconomic History  . Marital status: Widowed    Spouse name: Not on file  . Number of children: 2  . Years of education: 11  . Highest education level: Not on file  Occupational History  . Occupation: [employer]  . Financial resource strain: Not on file  . Food insecurity    Worry: Not on file    Inability: Not on file  . Transportation needs    Medical: Not on file    Non-medical: Not on file  Tobacco Use  . Smoking status: Former Smoker    Packs/day: 2.50    Years: 50.00    Pack years: 125.00    Types: Cigarettes    Quit date: 05/05/2005    Years since quitting: 13.7  . Smokeless tobacco: Former Systems developer    Quit date: 05/03/2012  Substance and Sexual Activity  . Alcohol use: No    Alcohol/week: 0.0 standard drinks    Comment: last use 2013  . Drug use: No  . Sexual activity: Not on file  Comment: widowed, Veteran  Lifestyle  . Physical activity    Days per week: Not on file    Minutes per session: Not on file  . Stress: Not on file  Relationships  . Social Herbalist on phone: Not on file    Gets together: Not on file    Attends religious service: Not on file    Active member of club or organization: Not on file    Attends meetings of clubs or organizations: Not on file    Relationship status: Not on file  . Intimate partner violence    Fear of current or ex partner: Not on file    Emotionally abused: Not on file    Physically abused: Not on file    Forced sexual activity: Not on file  Other Topics Concern  . Not on file  Social History Narrative   Patient drinks 3 cups of caffeine daily.   Patient is right handed.   Family History  Problem Relation Age of Onset  . Colon cancer Mother   . Colon cancer Maternal  Aunt   . Colon cancer Maternal Uncle   . Heart attack Neg Hx   . Stroke Neg Hx       VITAL SIGNS BP 118/68   Ht 6' (1.829 m)   SpO2 92%   BMI 30.24 kg/m   Facility-Administered Encounter Medications as of 02/14/2019  Medication  . acetaminophen (TYLENOL) tablet 650 mg   Or  . acetaminophen (TYLENOL) suppository 650 mg  . ALPRAZolam (XANAX) tablet 2 mg  . amitriptyline (ELAVIL) tablet 100 mg  . apixaban (ELIQUIS) tablet 5 mg  . bisacodyl (DULCOLAX) EC tablet 5 mg  . camphor-menthol (SARNA) lotion 1 application  . Chlorhexidine Gluconate Cloth 2 % PADS 6 each  . collagenase (SANTYL) ointment  . diphenhydrAMINE-zinc acetate (BENADRYL) 2-0.1 % cream  . feeding supplement (PRO-STAT SUGAR FREE 64) liquid 30 mL  . finasteride (PROSCAR) tablet 5 mg  . gabapentin (NEURONTIN) capsule 800 mg  . guaiFENesin-dextromethorphan (ROBITUSSIN DM) 100-10 MG/5ML syrup 5 mL  . hydrocortisone cream 1 %  . ipratropium-albuterol (DUONEB) 0.5-2.5 (3) MG/3ML nebulizer solution 3 mL  . ipratropium-albuterol (DUONEB) 0.5-2.5 (3) MG/3ML nebulizer solution 3 mL  . latanoprost (XALATAN) 0.005 % ophthalmic solution 1 drop  . metoprolol tartrate (LOPRESSOR) tablet 25 mg  . mometasone-formoterol (DULERA) 200-5 MCG/ACT inhaler 2 puff  . multivitamin-lutein (OCUVITE-LUTEIN) capsule 1 capsule  . nutrition supplement (JUVEN) (JUVEN) powder packet 1 packet  . ondansetron (ZOFRAN) tablet 4 mg   Or  . ondansetron (ZOFRAN) injection 4 mg  . protein supplement (ENSURE MAX) liquid  . saccharomyces boulardii (FLORASTOR) capsule 250 mg  . tamsulosin (FLOMAX) capsule 0.4 mg   Outpatient Encounter Medications as of 02/14/2019  Medication Sig  . alprazolam (XANAX) 2 MG tablet Take 2 mg by mouth 2 (two) times daily.  . Amino Acids-Protein Hydrolys (FEEDING SUPPLEMENT, PRO-STAT SUGAR FREE 64,) LIQD Take 30 mLs by mouth 2 (two) times daily between meals.   [name] amitriptyline (ELAVIL) 100 MG tablet Take 100 mg by mouth at  bedtime.  [name] apixaban (ELIQUIS) 2.5 MG TABS tablet Take 5 mg by mouth 2 (two) times daily.   Roseanne Kaufman [place]-Castor Oil (VENELEX) OINT Apply 1 application topically See admin instructions. Apply to coccyx, sacrum, bilateral buttocks each shift and prn  . bisacodyl (DULCOLAX) 5 MG EC tablet Take 5 mg by mouth daily as needed for mild constipation or moderate constipation.  . budesonide-formoterol (  SYMBICORT) 160-4.5 MCG/ACT inhaler Inhale 2 puffs into the lungs 2 (two) times daily.  . camphor-menthol (ANTI-ITCH) lotion Apply 1 application topically 2 (two) times daily as needed for itching. Sparingly  . collagenase (SANTYL) ointment Apply 1 application topically daily. Apply per treatment orders  . docusate sodium (COLACE) 100 MG capsule Take 100 mg by mouth daily as needed for mild constipation.  . finasteride (PROSCAR) 5 MG tablet Take 5 mg by mouth every morning.   . furosemide (LASIX) 40 MG tablet Take 1 tablet (40 mg total) by mouth daily.  [name] gabapentin (NEURONTIN) 800 MG tablet Take 1 tablet (800 mg total) by mouth 3 (three) times daily.  [name] guaiFENesin (MUCINEX) 600 MG 12 hr tablet Take 600 mg by mouth 2 (two) times daily.   [name] ipratropium-albuterol (DUONEB) 0.5-2.5 (3) MG/3ML SOLN Take 3 mLs by nebulization every 4 (four) hours as needed (For wheezing and worsening shortness of breath or respiratory distress).  [name] latanoprost (XALATAN) 0.005 % ophthalmic solution Place 1 drop into both eyes at bedtime.  . metoprolol tartrate (LOPRESSOR) 25 MG tablet Take 25 mg by mouth 2 (two) times daily.   . Multiple Vitamins-Minerals (MULTIVITAMIN WITH MINERALS) tablet Take 1 tablet by mouth daily.  . nitroGLYCERIN (NITROSTAT) 0.4 MG SL tablet Place 1 tablet (0.4 mg total) under the tongue every 5 (five) minutes x 3 doses as needed for chest pain.  . NON FORMULARY Diet: Regular, NAS, Consistent Carbohydrate  . ondansetron (ZOFRAN) 4 MG tablet Take 4 mg by mouth every 6 (six) hours.   . OXYGEN Oxygen @@ 3L/mn  via [place] Special Instructions: Document O2 sat qshift. Every Shift Day, Evening, Night  . potassium chloride (K-DUR) 10 MEQ tablet Take 1 tablet (10 mEq total) by mouth daily.  . sodium chloride 0.9 % SOLN Piggyback; - ; amt: 116mls/hr; intravenous  Special Instructions: Normal saline 0.9% at 100cc/hr X2 litres Every Shift Day, Evening, Night  . tamsulosin (FLOMAX) 0.4 MG CAPS capsule Take 0.4 mg by mouth at bedtime.  . [DISCONTINUED] lamoTRIgine (LAMICTAL) 100 MG tablet Take 1 tablet (100 mg total) by mouth 2 (two) times daily.     SIGNIFICANT DIAGNOSTIC EXAMS       ASSESSMENT/ PLAN:    MD is aware of resident's narcotic use and is in agreement with current plan of care. We will attempt to wean resident as appropriate.  Ok Edwards NP [facility]  Contact [phone] Monday through Friday 8am- 5pm  After hours call [phone]   This encounter was created in error - please disregard.

## 2019-02-14 NOTE — Progress Notes (Signed)
Location:    [facility] Room Number: 157/D Place of Service:  SNF (31)   CODE STATUS: Full Code  Allergies  Allergen Reactions  . Codeine Itching  . Trileptal [Oxcarbazepine]     Other reaction(s): Other (See Comments) Abnormal sodium levels Hyponatremia    Chief Complaint  Patient presents with  . Hospitalization Follow-up    Hospitalization Follow Up    HPI:  He is a 73 year old resident of this facility who has been hospitalized from 02-08-19 through 02-14-19. He had been treated for HCAP; his wbc became more elevated despite his abt use. His d-dimer was significantly elevated; liver enzymes significantly elevated. He completed his abt; was given IVF; his encephalopathy improved; his liver function has improved. He denies any uncontrolled pain. he does have significant edema present. He states that he has poor energy and poor appetite. He will continue to be followed for his chronic illnesses including: chf; resp failure; atrial flutter.   Past Medical History:  Diagnosis Date  . AAA (abdominal aortic aneurysm), stable 04/20/2014  . Arthritis   . 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 CAD (coronary artery disease)    a. s/p cath in 2015 showing occluded RCA and high-grade LCx stenosis treated with DESx2 to LCx  . Cancer ([place])   . Depression   . Diverticulitis    hospital 2011 [facility]  . Diverticulosis   . GERD (gastroesophageal reflux disease)   . 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 Hernia of unspecified site of abdominal cavity without mention of obstruction or gangrene    hiatal  . Hyperlipidemia LDL goal <70 04/20/2014  . Hypertension   . IBS (irritable bowel syndrome)   . Myocardial infarction ([place]) 01/2014   NSTEMI  . Peripheral vascular disease ([facility])    ankle brachial index of 0.79 on the right and 0.61 on the left.   . Pneumonia   . 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 Reflex sympathetic dystrophy   . 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 SOB  (shortness of breath)     Past Surgical History:  Procedure Laterality Date  . CARDIAC CATHETERIZATION    . CATARACT EXTRACTION    . CORONARY ANGIOPLASTY    . HERNIA REPAIR    . LEFT HEART CATHETERIZATION WITH CORONARY ANGIOGRAM N/A 01/17/2014   Procedure: LEFT HEART CATHETERIZATION WITH CORONARY ANGIOGRAM;  Surgeon: Leonie Man, MD;  Location: [facility] CATH LAB;  Service: Cardiovascular;  Laterality: N/A;  . left shoulder      x 3  . NISSEN FUNDOPLICATION    . right elbow surgery     x 2  . right knee arthroscopy    . squamous cell skin cancer     Left Hand  . SUPRAVENTRICULAR TACHYCARDIA ABLATION N/A 01/16/2014   Procedure: SUPRAVENTRICULAR TACHYCARDIA ABLATION;  Surgeon: Evans Lance, MD;  Location: [facility] CATH LAB;  Service: Cardiovascular;  Laterality: N/A;    Social History   Socioeconomic History  . Marital status: Widowed    Spouse name: Not on file  . Number of children: 2  . Years of education: 32  . Highest education level: Not on file  Occupational History  . Occupation: [employer]  . Financial resource strain: Not on file  . Food insecurity    Worry: Not on file    Inability: Not on file  . Transportation needs    Medical: Not on file    Non-medical: Not on file  Tobacco Use  . Smoking status: Former Smoker    Packs/day: 2.50    Years: 50.00    Pack years: 125.00    Types: Cigarettes    Quit date: 05/05/2005    Years since quitting: 13.7  . Smokeless tobacco: Former Systems developer    Quit date: 05/03/2012  Substance and Sexual Activity  . Alcohol use: No    Alcohol/week: 0.0 standard drinks    Comment: last use 2013  . Drug use: No  . Sexual activity: Not on file    Comment: widowed, Veteran  Lifestyle  . Physical activity    Days per week: Not on file    Minutes per session: Not on file  . Stress: Not on file  Relationships  . Social Herbalist on phone: Not on file    Gets together: Not on file    Attends religious service: Not on  file    Active member of club or organization: Not on file    Attends meetings of clubs or organizations: Not on file    Relationship status: Not on file  . Intimate partner violence    Fear of current or ex partner: Not on file    Emotionally abused: Not on file    Physically abused: Not on file    Forced sexual activity: Not on file  Other Topics Concern  . Not on file  Social History Narrative   Patient drinks 3 cups of caffeine daily.   Patient is right handed.   Family History  Problem Relation Age of Onset  . Colon cancer Mother   . Colon cancer Maternal Aunt   . Colon cancer Maternal Uncle   . Heart attack Neg Hx   . Stroke Neg Hx       VITAL SIGNS There were no vitals taken for this visit.  Outpatient Encounter Medications as of 02/14/2019  Medication Sig  . alprazolam (XANAX) 2 MG tablet Take 2 mg by mouth 2 (two) times daily.  . Amino Acids-Protein Hydrolys (FEEDING SUPPLEMENT, PRO-STAT SUGAR FREE 64,) LIQD Take 30 mLs by mouth 2 (two) times daily between meals.   [name] amitriptyline (ELAVIL) 100 MG tablet Take 100 mg by mouth at bedtime.  [name] apixaban (ELIQUIS) 2.5 MG TABS tablet Take 5 mg by mouth 2 (two) times daily.   Roseanne Kaufman [place]-Castor Oil (VENELEX) OINT Apply 1 application topically See admin instructions. Apply to coccyx, sacrum, bilateral buttocks each shift and prn  . bisacodyl (DULCOLAX) 5 MG EC tablet Take 5 mg by mouth daily as needed for mild constipation or moderate constipation.  . budesonide-formoterol (SYMBICORT) 160-4.5 MCG/ACT inhaler Inhale 2 puffs into the lungs 2 (two) times daily.  . camphor-menthol (ANTI-ITCH) lotion Apply 1 application topically 2 (two) times daily as needed for itching. Sparingly  . collagenase (SANTYL) ointment Apply 1 application topically daily. Apply per treatment orders  . docusate sodium (COLACE) 100 MG capsule Take 100 mg by mouth daily as needed for mild constipation.  . Ensure Max Protein (ENSURE MAX PROTEIN) LIQD Take  330 mLs (11 oz total) by mouth 2 (two) times daily.  . finasteride (PROSCAR) 5 MG tablet Take 5 mg by mouth every morning.   . gabapentin (NEURONTIN) 800 MG tablet Take 1 tablet (800 mg total) by mouth 3 (three) times daily.  [name] guaiFENesin (MUCINEX) 600 MG 12 hr tablet Take 600 mg by mouth 2 (two) times daily.   [name] ipratropium-albuterol (DUONEB) 0.5-2.5 (3) MG/3ML SOLN Take 3  mLs by nebulization every 4 (four) hours as needed (For wheezing and worsening shortness of breath or respiratory distress).  [name] latanoprost (XALATAN) 0.005 % ophthalmic solution Place 1 drop into both eyes at bedtime.  . metoprolol tartrate (LOPRESSOR) 25 MG tablet Take 25 mg by mouth 2 (two) times daily.   . Multiple Vitamins-Minerals (MULTIVITAMIN WITH MINERALS) tablet Take 1 tablet by mouth daily.  . nitroGLYCERIN (NITROSTAT) 0.4 MG SL tablet Place 1 tablet (0.4 mg total) under the tongue every 5 (five) minutes x 3 doses as needed for chest pain.  . NON FORMULARY Diet: Regular, NAS, Consistent Carbohydrate  . ondansetron (ZOFRAN) 4 MG tablet Take 4 mg by mouth every 6 (six) hours.   . OXYGEN Oxygen @@ 3L/mn via [place] Special Instructions: Document O2 sat qshift. Every Shift Day, Evening, Night  . potassium chloride (KLOR-CON) 10 MEQ tablet Take 10 mEq by mouth daily.  . tamsulosin (FLOMAX) 0.4 MG CAPS capsule Take 0.4 mg by mouth at bedtime.  . torsemide (DEMADEX) 20 MG tablet Take 20 mg by mouth daily.      SIGNIFICANT DIAGNOSTIC EXAMS   PREVIOUS;   01-01-19: chest x-ray:  1. Cardiomegaly with vascular congestion. 2. Small bilateral pleural effusions and bibasilar densities similar to prior radiograph  01-02-19: bilateral lower extremity venous doppler:  1. No evidence of deep venous thrombosis in either lower extremity. 2. Increased pulsatility of the venous waveforms suggests elevated right heart pressures. Findings can be seen in tricuspid regurgitation, right heart failure, pulmonary hypertension and COPD.   01-02-19: 2-d echo:   1. The left ventricle has normal systolic function, with an ejection fraction of 55-60%. The cavity size was normal. There is mildly increased left ventricular wall thickness. Left ventricular diastolic Doppler parameters are indeterminate.  2. The right ventricle has normal systolic function. The cavity was normal. There is no increase in right ventricular wall thickness. Right ventricular systolic pressure could not be assessed.  3. No evidence of mitral valve stenosis.  4. The aortic valve is tricuspid. Mild thickening of the aortic valve. Mild calcification of the aortic valve. No stenosis of the aortic valve.  5. The aorta is normal unless otherwise noted.  6. The ascending aorta is normal in size and structure.  01-03-19: chest x-ray: Stable cardiomegaly, pulmonary vascular congestion, and bilateral pleural-parenchymal scarring. No acute findings.  01-28-19: chest x-ray: Similar appearance of reticulonodular opacities in the mid and lower lungs, which has progressed slowly from 2016 on chest x-ray exams. While the changes most likely represent chronic interstitial fibrosis/scarring, superimposed infection cannot be excluded radiographically   02-04-19: chest x-ray: Extensive bilateral calcified pleural plaques. Chronic increased markings throughout the lungs, favor scarring. No definite acute process   TODAY;   02-08-19: chest x-ray; Unchanged reticulonodular opacities throughout both lungs, likely due to chronic lung changes, however cannot exclude superimposed infectious etiology  02-08-19: ct of abdomen and pelvis;  1.  No acute intra-abdominal process. 2. New small ascites. 3. Unchanged 3.2 cm infrarenal abdominal aortic aneurysm. Recommend followup by ultrasound in 3 years. T . Aortic aneurysm  4.  Aortic atherosclerosis   02-09-19: right upper quad ultrasound: Gallbladder distention is noted without cholelithiasis. No gallbladder wall thickening or pericholecystic  fluid is noted. No other abnormality seen in the right upper quadrant of the abdomen.  02-11-19: ct angio of chest:  1.  Negative examination for pulmonary embolism. 2. There are large, bilateral calcified pleural plaques throughout. Small bilateral pleural effusions, generally loculated and chronic appearing. Findings  are in keeping with prior asbestos exposure. 3. Bulky mediastinal and hilar lymphadenopathy, similar to prior examination dated 04/24/2014, largest right paratracheal lymph nodes measuring 2.4 x 2.4 cm (series 4, image 25), of uncertain significance given long-term stability, perhaps reactive to extensive pleural disease. 4.  Cardiomegaly and coronary artery disease. 5. Enlargement of the main pulmonary artery, as can be seen in pulmonary hypertension. 6.  Ascites in the included upper abdomen. 7.  Aortic Atherosclerosis  02-14-19: MRI of brain 1. Significant and progressive motion degradation. 2. Diffusion imaging is diagnostic and negative for acute infarct. 3. Senescent changes without significant progression since 2015.    LABS REVIEWED PREVIOUS;   01-01-19: wbc 8.2; hgb 14.2; hct 51.1; mcv 103.4; plt 216; glucose 100; bun 46; creat 1.88; k+ 5.8; na++ 142; ca 8.7; liver normal albumin 3.1 01-02-19: wbc 5.3; hgb 145; hct 51.8; mcv 101.2; plt 219; glucose 132; bun 45; creat 1.52; k+ 4.7; na++ 143; ca 9.0 ;liver normal albumin 3.3; tsh 0.435 vit B 12; 296; urine culture: staphylococcus species 01-05-19: glucose 129; bun 37; creat 0.98; k+ 4.1; na++ 134; ca 8.5; mag 1.9 01-10-19: glucose 111; bun 38; creat 0.85; k+ 3.5; na++ 130; ca 8.7  01-28-19: wbc 8.6; hgb 11.0 hct 36.1; mcv 93.0 plt 277; glucose 125; bun 20; creat 1.08; k+ 4.5; na++ 134; ca 8.9 blood and urine cultures: no growth. 02-04-19: wbc 18.0; hgb 11.0; hct 36.3; mcv 92.4; plt 545 02-08-19: wbc 27.4; hgb 10.4; hct 34.0; mcv 93.7; plt 478; glucose 201; bun 36; creat 2.31; k+ 4.8; na++ 131; ca 8.7; ast 1485.0; alt 846.0;  albumin 2.1; amalse 16; lipase 22; direct bili 0.1 d-dimer: 4.15  c-diff: neg  Hepatitis panel: neg  TODAY;  02-09-19: wbc 16.2; hgb 9.7; hct 32.7; mcv 95.1; plt 347; glucose 106; bun 37; creat 2.14; k+ 4.0; na++ 133; ca 8.1; ast 651; alt 801; albumin 2.0; tsh 2.140; INR 2.4 02-12-19: wbc 14.8; hgb 9.9; hct 33.3; mcv 95.4; plt 324; glucose 96; bun 24; creat 0.96; k+ 3.9; na++ 136; ca 8.9; ast 77 alt 272; albumin 2.1 02-14-19: wbc 12.5; hgb 9.8; hct 32.9; mcv 97.1 plt 312; glucose 114; bun 25; creat 0.95; k+ 4.1; na++ 138; ca 9.2; ast 18; alt 134; albumin 2.0    Review of Systems  Constitutional: Negative for malaise/fatigue.  Respiratory: Negative for cough and shortness of breath.   Cardiovascular: Negative for chest pain, palpitations and leg swelling.  Gastrointestinal: Negative for abdominal pain, constipation and heartburn.  Musculoskeletal: Negative for back pain, joint pain and myalgias.  Skin: Negative.   Neurological: Negative for dizziness.  Psychiatric/Behavioral: The patient is not nervous/anxious.     Physical Exam Constitutional:      General: He is not in acute distress.    Appearance: He is well-developed. He is not diaphoretic.  Eyes:     Comments: History of cataract extraction   Neck:     Musculoskeletal: Neck supple.     Thyroid: No thyromegaly.  Cardiovascular:     Rate and Rhythm: Normal rate and regular rhythm.     Pulses: Normal pulses.     Heart sounds: Normal heart sounds.     Comments: History of SVT ablation; ptca  Pedal pulses present Feet cool to touch Pulmonary:     Effort: Pulmonary effort is normal. No respiratory distress.     Breath sounds: Normal breath sounds.     Comments: 02 dependent Bilateral lobes diminished sounds  Abdominal:     General:  Bowel sounds are normal. There is distension.     Palpations: Abdomen is soft.     Tenderness: There is no abdominal tenderness.     Comments: Has bruising present on lower quads Mild right  upper quad tenderness    Genitourinary:    Comments: Foley Penile edema present  Musculoskeletal:     Right lower leg: Edema present.     Left lower leg: Edema present.     Comments: Has significant  edema present abdomen to feet   Lymphadenopathy:     Cervical: No cervical adenopathy.  Skin:    General: Skin is warm and dry.     Comments: Left heel: unstaged soft eschar: 7 x 8 x 0.1cm Right heel unstaged: soft eschar: 5 x 4 cm Left buttock: unstaged: soft eschar: 5 x 5.5 x 1.5 cm with 1.5 cm undermining present No signs of infection present  Has skin yeast infection to buttocks.   Neurological:     Mental Status: He is alert. Mental status is at baseline.  Psychiatric:        Mood and Affect: Mood normal.      ASSESSMENT/ PLAN:  TODAY ;  1. Acute diastolic CHF (congestive heart failure) EF 55-60% (12-06-18) will begin demadex 20 mg daily with k+ 10 meq daily lopressor 25 mg twice daily has ntg as needed for chest pain.   2. Hypokalemia: is stable k+ 4.1 will continue k+ 10 meq daily   3. Generalized anxiety disorder: is stable will continue xanax 2 mg twice daily   4. Acute on chronic respiratory failure with hypoxia and hypercapnia/COPD with chronic bronchitis asbestosis: is stable is 02 dependent; will continue mucinex 600 mg twice daily duoneb every 4 hours as needed; symbicort 160/4.5 mcg 2 puffs twice daily   5. Atrial flutter/SVT (supraventricular tachycardia) s/p ablation: heart rate is stable will continue lopressor 25 mg twice daily for rate control and eliquis 5 mg twice daily   6. Peripheral vascular disease/heredity and idiopathic peripheral neuropathy: is stable will continue elavil 100 mg nightly and neurontin 800 mg three times daily   7.  Urinary retention is stable has foley has failed 2 voiding trial; awaiting urology consult will continue flomax 0.4 mg daily and proscar 5 mg daily   8. Hyperlipidemia LDL goal <70: has been taken off lipitor due to liver  function will monitor   9. Protein calorie malnutrition severe: albumin 2.0 will continue prostat twice daily and supplements as directed will monitor  10. Primary open glaucoma of both eyes unspecified stage: will continue xalatan to both eyes  11. GERD without esophagitis: is stable at this time will continue zofram 4 mg every 6 hours for nausea  12. Unstagable pressure ulcer of left buttock/decubitus ulcer bilateral heels unstagable: will continue current treatments and supplements.   13. Elevated liver enzymes; acute hepatitis; elevated d-dimer: will check INR and d-dimer; will monitor his status.   14. Skin yeast on buttocks: will use lotrimin crm 1% three times daily to buttocks for 2 weeks and will monitor; will avoid diflucan due to his liver function.      MD is aware of resident's narcotic use and is in agreement with current plan of care. We will attempt to wean resident as appropriate.  Ok Edwards NP [facility]  Contact [phone] Monday through Friday 8am- 5pm  After hours call [phone]

## 2019-02-14 NOTE — Progress Notes (Signed)
Report called to [place], LPN at [facility].

## 2019-02-15 ENCOUNTER — Encounter: Payer: Self-pay | Admitting: Adult Health

## 2019-02-15 ENCOUNTER — Non-Acute Institutional Stay (SKILLED_NURSING_FACILITY): Payer: Medicare Other | Admitting: Adult Health

## 2019-02-15 ENCOUNTER — Inpatient Hospital Stay (HOSPITAL_COMMUNITY): Payer: Medicare Other | Attending: Adult Health

## 2019-02-15 ENCOUNTER — Encounter (HOSPITAL_COMMUNITY)
Admission: RE | Admit: 2019-02-15 | Discharge: 2019-02-15 | Disposition: A | Discharge status: Home / Self Care | Payer: Medicare Other | Source: Skilled Nursing Facility | Attending: Internal Medicine | Admitting: Internal Medicine

## 2019-02-15 DIAGNOSIS — J441 Chronic obstructive pulmonary disease with (acute) exacerbation: Secondary | ICD-10-CM | POA: Insufficient documentation

## 2019-02-15 DIAGNOSIS — K219 Gastro-esophageal reflux disease without esophagitis: Secondary | ICD-10-CM | POA: Insufficient documentation

## 2019-02-15 DIAGNOSIS — I5031 Acute diastolic (congestive) heart failure: Secondary | ICD-10-CM

## 2019-02-15 DIAGNOSIS — N179 Acute kidney failure, unspecified: Secondary | ICD-10-CM | POA: Insufficient documentation

## 2019-02-15 DIAGNOSIS — R7401 Elevation of levels of liver transaminase levels: Secondary | ICD-10-CM | POA: Insufficient documentation

## 2019-02-15 LAB — PROTIME-INR
INR: 1.6 — ABNORMAL HIGH (ref 0.8–1.2)
Prothrombin Time: 18.8 seconds — ABNORMAL HIGH (ref 11.4–15.2)

## 2019-02-15 LAB — D-DIMER, QUANTITATIVE: D-Dimer, Quant: 3.24 ug/mL-FEU — ABNORMAL HIGH (ref 0.00–0.50)

## 2019-02-15 NOTE — Progress Notes (Signed)
Location:    [facility] Room Number: 157/D Place of Service:  SNF (31)   CODE STATUS: Full Code  Allergies  Allergen Reactions  . Codeine Itching  . Trileptal [Oxcarbazepine]     Other reaction(s): Other (See Comments) Abnormal sodium levels Hyponatremia   Chief Complaint  Patient presents with  . Acute Visit    chnage in status      HPI:    Past Medical History:  Diagnosis Date  . AAA (abdominal aortic aneurysm), stable 04/20/2014  . Arthritis   . 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 CAD (coronary artery disease)    a. s/p cath in 2015 showing occluded RCA and high-grade LCx stenosis treated with DESx2 to LCx  . Cancer ([place])   . Depression   . Diverticulitis    hospital 2011 [facility]  . Diverticulosis   . GERD (gastroesophageal reflux disease)   . 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 Hernia of unspecified site of abdominal cavity without mention of obstruction or gangrene    hiatal  . Hyperlipidemia LDL goal <70 04/20/2014  . Hypertension   . IBS (irritable bowel syndrome)   . Myocardial infarction ([place]) 01/2014   NSTEMI  . Peripheral vascular disease (HCC)    ankle brachial index of 0.79 on the right and 0.61 on the left.   . Pneumonia   . 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 Reflex sympathetic dystrophy   . 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 SOB (shortness of breath)     Past Surgical History:  Procedure Laterality Date  . CARDIAC CATHETERIZATION    . CATARACT EXTRACTION    . CORONARY ANGIOPLASTY    . HERNIA REPAIR    . LEFT HEART CATHETERIZATION WITH CORONARY ANGIOGRAM N/A 01/17/2014   Procedure: LEFT HEART CATHETERIZATION WITH CORONARY ANGIOGRAM;  Surgeon: Leonie Man, MD;  Location: [facility] CATH LAB;  Service: Cardiovascular;  Laterality: N/A;  . left shoulder      x 3  . NISSEN FUNDOPLICATION    . right elbow surgery     x 2  . right knee arthroscopy    . squamous cell skin cancer     Left Hand  . SUPRAVENTRICULAR TACHYCARDIA  ABLATION N/A 01/16/2014   Procedure: SUPRAVENTRICULAR TACHYCARDIA ABLATION;  Surgeon: Evans Lance, MD;  Location: [facility] CATH LAB;  Service: Cardiovascular;  Laterality: N/A;    Social History   Socioeconomic History  . Marital status: Widowed    Spouse name: Not on file  . Number of children: 2  . Years of education: 55  . Highest education level: Not on file  Occupational History  . Occupation: [employer]  . Financial resource strain: Not on file  . Food insecurity    Worry: Not on file    Inability: Not on file  . Transportation needs    Medical: Not on file    Non-medical: Not on file  Tobacco Use  . Smoking status: Former Smoker    Packs/day: 2.50    Years: 50.00    Pack years: 125.00    Types: Cigarettes    Quit date: 05/05/2005    Years since quitting: 13.7  . Smokeless tobacco: Former Systems developer    Quit date: 05/03/2012  Substance and Sexual Activity  . Alcohol use: No    Alcohol/week: 0.0 standard drinks    Comment: last use 2013  . Drug use: No  . Sexual activity: Not on file  Comment: widowed, Veteran  Lifestyle  . Physical activity    Days per week: Not on file    Minutes per session: Not on file  . Stress: Not on file  Relationships  . Social Herbalist on phone: Not on file    Gets together: Not on file    Attends religious service: Not on file    Active member of club or organization: Not on file    Attends meetings of clubs or organizations: Not on file    Relationship status: Not on file  . Intimate partner violence    Fear of current or ex partner: Not on file    Emotionally abused: Not on file    Physically abused: Not on file    Forced sexual activity: Not on file  Other Topics Concern  . Not on file  Social History Narrative   Patient drinks 3 cups of caffeine daily.   Patient is right handed.   Family History  Problem Relation Age of Onset  . Colon cancer Mother   . Colon cancer Maternal Aunt   . Colon cancer  Maternal Uncle   . Heart attack Neg Hx   . Stroke Neg Hx       VITAL SIGNS BP (!) 151/83   Pulse 98   Temp 98.9 F (37.2 C) (Oral)   Resp 20   Ht 6' (1.829 m)   Wt 202 lb 3.2 oz (91.7 kg)   SpO2 98%   BMI 27.42 kg/m   Outpatient Encounter Medications as of 02/15/2019  Medication Sig  . alprazolam (XANAX) 2 MG tablet Take 2 mg by mouth 2 (two) times daily.  . Amino Acids-Protein Hydrolys (FEEDING SUPPLEMENT, PRO-STAT SUGAR FREE 64,) LIQD Take 30 mLs by mouth 2 (two) times daily between meals.   [name] amitriptyline (ELAVIL) 100 MG tablet Take 100 mg by mouth at bedtime.  [name] apixaban (ELIQUIS) 2.5 MG TABS tablet Take 5 mg by mouth 2 (two) times daily.   Roseanne Kaufman [place]-Castor Oil (VENELEX) OINT Apply 1 application topically See admin instructions. Apply to coccyx, sacrum, bilateral buttocks each shift and prn  . bisacodyl (DULCOLAX) 5 MG EC tablet Take 5 mg by mouth daily as needed for mild constipation or moderate constipation.  . budesonide-formoterol (SYMBICORT) 160-4.5 MCG/ACT inhaler Inhale 2 puffs into the lungs 2 (two) times daily.  . camphor-menthol (ANTI-ITCH) lotion Apply 1 application topically 2 (two) times daily as needed for itching. Sparingly  . clotrimazole (LOTRIMIN) 1 % cream Instructions: apply to buttock rash three times daily Three Times A Day 09:00 AM, 02:00 PM, 09:00 PM  . collagenase (SANTYL) ointment Apply 1 application topically daily. Apply per treatment orders  . docusate sodium (COLACE) 100 MG capsule Take 100 mg by mouth daily as needed for mild constipation.  . Ensure Max Protein (ENSURE MAX PROTEIN) LIQD Take 330 mLs (11 oz total) by mouth 2 (two) times daily.  . finasteride (PROSCAR) 5 MG tablet Take 5 mg by mouth every morning.   . gabapentin (NEURONTIN) 800 MG tablet Take 1 tablet (800 mg total) by mouth 3 (three) times daily.  [name] guaiFENesin (MUCINEX) 600 MG 12 hr tablet Take 600 mg by mouth 2 (two) times daily.   [name] ipratropium-albuterol (DUONEB) 0.5-2.5  (3) MG/3ML SOLN Take 3 mLs by nebulization every 4 (four) hours as needed (For wheezing and worsening shortness of breath or respiratory distress).  [name] latanoprost (XALATAN) 0.005 % ophthalmic solution Place 1 drop into both  eyes at bedtime.  . metoprolol tartrate (LOPRESSOR) 25 MG tablet Take 25 mg by mouth 2 (two) times daily.   . Multiple Vitamins-Minerals (MULTIVITAMIN WITH MINERALS) tablet Take 1 tablet by mouth daily.  . nitroGLYCERIN (NITROSTAT) 0.4 MG SL tablet Place 1 tablet (0.4 mg total) under the tongue every 5 (five) minutes x 3 doses as needed for chest pain.  . NON FORMULARY Diet: Regular, NAS, Consistent Carbohydrate  . ondansetron (ZOFRAN) 4 MG tablet Take 4 mg by mouth every 6 (six) hours.   . OXYGEN Oxygen @@ 3L/mn via [place] Special Instructions: Document O2 sat qshift. Every Shift Day, Evening, Night  . spironolactone (ALDACTONE) 25 MG tablet Take 25 mg by mouth daily.  . tamsulosin (FLOMAX) 0.4 MG CAPS capsule Take 0.4 mg by mouth at bedtime.  . torsemide (DEMADEX) 20 MG tablet Take 20 mg by mouth daily.  . [DISCONTINUED] lamoTRIgine (LAMICTAL) 100 MG tablet Take 1 tablet (100 mg total) by mouth 2 (two) times daily.  . [DISCONTINUED] potassium chloride (KLOR-CON) 10 MEQ tablet Take 10 mEq by mouth daily.   No facility-administered encounter medications on file as of 02/15/2019.      SIGNIFICANT DIAGNOSTIC EXAMS   PREVIOUS;   01-01-19: chest x-ray:  1. Cardiomegaly with vascular congestion. 2. Small bilateral pleural effusions and bibasilar densities similar to prior radiograph  01-02-19: bilateral lower extremity venous doppler:  1. No evidence of deep venous thrombosis in either lower extremity. 2. Increased pulsatility of the venous waveforms suggests elevated right heart pressures. Findings can be seen in tricuspid regurgitation, right heart failure, pulmonary hypertension and COPD.  01-02-19: 2-d echo:   1. The left ventricle has normal systolic function, with an  ejection fraction of 55-60%. The cavity size was normal. There is mildly increased left ventricular wall thickness. Left ventricular diastolic Doppler parameters are indeterminate.  2. The right ventricle has normal systolic function. The cavity was normal. There is no increase in right ventricular wall thickness. Right ventricular systolic pressure could not be assessed.  3. No evidence of mitral valve stenosis.  4. The aortic valve is tricuspid. Mild thickening of the aortic valve. Mild calcification of the aortic valve. No stenosis of the aortic valve.  5. The aorta is normal unless otherwise noted.  6. The ascending aorta is normal in size and structure.  01-03-19: chest x-ray: Stable cardiomegaly, pulmonary vascular congestion, and bilateral pleural-parenchymal scarring. No acute findings.  01-28-19: chest x-ray: Similar appearance of reticulonodular opacities in the mid and lower lungs, which has progressed slowly from 2016 on chest x-ray exams. While the changes most likely represent chronic interstitial fibrosis/scarring, superimposed infection cannot be excluded radiographically   02-04-19: chest x-ray: Extensive bilateral calcified pleural plaques. Chronic increased markings throughout the lungs, favor scarring. No definite acute process   02-08-19: chest x-ray; Unchanged reticulonodular opacities throughout both lungs, likely due to chronic lung changes, however cannot exclude superimposed infectious etiology  02-08-19: ct of abdomen and pelvis;  1.  No acute intra-abdominal process. 2. New small ascites. 3. Unchanged 3.2 cm infrarenal abdominal aortic aneurysm. Recommend followup by ultrasound in 3 years. T . Aortic aneurysm  4.  Aortic atherosclerosis   02-09-19: right upper quad ultrasound: Gallbladder distention is noted without cholelithiasis. No gallbladder wall thickening or pericholecystic fluid is noted. No other abnormality seen in the right upper quadrant of the abdomen.   02-11-19: ct angio of chest:  1.  Negative examination for pulmonary embolism. 2. There are large, bilateral calcified pleural plaques throughout.  Small bilateral pleural effusions, generally loculated and chronic appearing. 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 3. Bulky mediastinal and hilar lymphadenopathy, similar to prior examination dated 04/24/2014, largest right paratracheal lymph nodes measuring 2.4 x 2.4 cm (series 4, image 25), of uncertain significance given long-term stability, perhaps reactive to extensive pleural disease. 4.  Cardiomegaly and coronary artery disease. 5. Enlargement of the main pulmonary artery, as can be seen in pulmonary hypertension. 6.  Ascites in the included upper abdomen. 7.  Aortic Atherosclerosis  02-14-19: MRI of brain 1. Significant and progressive motion degradation. 2. Diffusion imaging is diagnostic and negative for acute infarct. 3. Senescent changes without significant progression since 2015.  NO NEW EXAMS.     LABS REVIEWED PREVIOUS;   01-01-19: wbc 8.2; hgb 14.2; hct 51.1; mcv 103.4; plt 216; glucose 100; bun 46; creat 1.88; k+ 5.8; na++ 142; ca 8.7; liver normal albumin 3.1 01-02-19: wbc 5.3; hgb 145; hct 51.8; mcv 101.2; plt 219; glucose 132; bun 45; creat 1.52; k+ 4.7; na++ 143; ca 9.0 ;liver normal albumin 3.3; tsh 0.435 vit B 12; 296; urine culture: staphylococcus species 01-05-19: glucose 129; bun 37; creat 0.98; k+ 4.1; na++ 134; ca 8.5; mag 1.9 01-10-19: glucose 111; bun 38; creat 0.85; k+ 3.5; na++ 130; ca 8.7  01-28-19: wbc 8.6; hgb 11.0 hct 36.1; mcv 93.0 plt 277; glucose 125; bun 20; creat 1.08; k+ 4.5; na++ 134; ca 8.9 blood and urine cultures: no growth. 02-04-19: wbc 18.0; hgb 11.0; hct 36.3; mcv 92.4; plt 545 02-08-19: wbc 27.4; hgb 10.4; hct 34.0; mcv 93.7; plt 478; glucose 201; bun 36; creat 2.31; k+ 4.8; na++ 131; ca 8.7; ast 1485.0; alt 846.0; albumin 2.1; amalse 16; lipase 22; direct bili 0.1 d-dimer: 4.15   c-diff: neg  Hepatitis panel: neg 02-09-19: wbc 16.2; hgb 9.7; hct 32.7; mcv 95.1; plt 347; glucose 106; bun 37; creat 2.14; k+ 4.0; na++ 133; ca 8.1; ast 651; alt 801; albumin 2.0; tsh 2.140; INR 2.4 02-12-19: wbc 14.8; hgb 9.9; hct 33.3; mcv 95.4; plt 324; glucose 96; bun 24; creat 0.96; k+ 3.9; na++ 136; ca 8.9; ast 77 alt 272; albumin 2.1 02-14-19: wbc 12.5; hgb 9.8; hct 32.9; mcv 97.1 plt 312; glucose 114; bun 25; creat 0.95; k+ 4.1; na++ 138; ca 9.2; ast 18; alt 134; albumin 2.0  NO NEW LABS    Review of Systems  Constitutional: Positive for malaise/fatigue.  Respiratory: Positive for cough and shortness of breath.   Cardiovascular: Negative for chest pain, palpitations and leg swelling.  Gastrointestinal: Negative for abdominal pain, constipation and heartburn.  Musculoskeletal: Negative for back pain, joint pain and myalgias.  Skin: Negative.   Neurological: Negative for dizziness.  Psychiatric/Behavioral: The patient is not nervous/anxious.       Physical Exam Constitutional:      General: He is not in acute distress.    Appearance: He is well-developed. He is not diaphoretic.  Eyes:     Comments: History of cataract extraction    Neck:     Musculoskeletal: Neck supple.     Thyroid: No thyromegaly.  Cardiovascular:     Rate and Rhythm: Normal rate and regular rhythm.     Pulses: Normal pulses.     Heart sounds: Normal heart sounds.     Comments: History of SVT ablation; ptca  Pedal pulses present Feet cool to touch Pulmonary:     Effort: Pulmonary effort is normal. No respiratory distress.     Comments: 02 dependent Bilateral  lobes diminished sounds  Abdominal:     General: Bowel sounds are normal. There is no distension.     Palpations: Abdomen is soft.     Tenderness: There is no abdominal tenderness.     Comments: Has bruising present on lower quads  Genitourinary:    Comments: Foley Penile edema  Musculoskeletal:     Right lower leg: Edema present.     Left  lower leg: Edema present.     Comments: Is able to move all extremities Has significant edema present abdomen to feet   Lymphadenopathy:     Cervical: No cervical adenopathy.  Skin:    General: Skin is warm and dry.     Comments: Left heel: unstaged soft eschar: 7 x 8 x 0.1cm Right heel unstaged: soft eschar: 5 x 4 cm Left buttock: unstaged: soft eschar: 5 x 5.5 x 1.5 cm with 1.5 cm undermining present No signs of infection present  Has skin yeast infection to buttocks.    Neurological:     Mental Status: He is alert. Mental status is at baseline.  Psychiatric:        Mood and Affect: Mood normal.       ASSESSMENT/ PLAN:  TODAY ;  1. Acute diastolic CHF (congestive heart failure)   Is worse Will begin demadex 40 mg daily Will begin aldactone 25 mg daily Will check kub chest x-ray       MD is aware of resident's narcotic use and is in agreement with current plan of care. We will attempt to wean resident as appropriate.  Ok Edwards NP [facility]  Contact [phone] Monday through Friday 8am- 5pm  After hours call [phone]

## 2019-02-16 ENCOUNTER — Encounter: Payer: Self-pay | Admitting: Internal Medicine

## 2019-02-16 ENCOUNTER — Non-Acute Institutional Stay (SKILLED_NURSING_FACILITY): Payer: Medicare Other | Admitting: Internal Medicine

## 2019-02-16 DIAGNOSIS — L89612 Pressure ulcer of right heel, stage 2: Secondary | ICD-10-CM

## 2019-02-16 DIAGNOSIS — J189 Pneumonia, unspecified organism: Secondary | ICD-10-CM

## 2019-02-16 DIAGNOSIS — N401 Enlarged prostate with lower urinary tract symptoms: Secondary | ICD-10-CM

## 2019-02-16 DIAGNOSIS — R7989 Other specified abnormal findings of blood chemistry: Secondary | ICD-10-CM

## 2019-02-16 DIAGNOSIS — N179 Acute kidney failure, unspecified: Secondary | ICD-10-CM

## 2019-02-16 DIAGNOSIS — B179 Acute viral hepatitis, unspecified: Secondary | ICD-10-CM

## 2019-02-16 DIAGNOSIS — I471 Supraventricular tachycardia: Secondary | ICD-10-CM

## 2019-02-16 DIAGNOSIS — R601 Generalized edema: Secondary | ICD-10-CM

## 2019-02-16 DIAGNOSIS — L89626 Pressure-induced deep tissue damage of left heel: Secondary | ICD-10-CM

## 2019-02-16 DIAGNOSIS — L89152 Pressure ulcer of sacral region, stage 2: Secondary | ICD-10-CM

## 2019-02-16 DIAGNOSIS — N138 Other obstructive and reflux uropathy: Secondary | ICD-10-CM

## 2019-02-16 DIAGNOSIS — G609 Hereditary and idiopathic neuropathy, unspecified: Secondary | ICD-10-CM

## 2019-02-16 DIAGNOSIS — G9340 Encephalopathy, unspecified: Secondary | ICD-10-CM

## 2019-02-16 LAB — AEROBIC CULTURE W GRAM STAIN (SUPERFICIAL SPECIMEN): Gram Stain: NONE SEEN

## 2019-02-16 NOTE — Progress Notes (Signed)
: Provider:  Hennie Duos., MD Location:  [facility] Room Number: 157-D Place of Service:  SNF ([phone])  PCP: [name], MD Patient Care Team: [name], MD as PCP - General (Family Medicine) Herminio Commons, MD as PCP - Cardiology (Cardiology)  Extended Emergency Contact Information Primary Emergency Contact: [facility] Address: [address]          [place], [place] [address] Johnnette Litter of [place] Phone: [phone] Mobile Phone: [phone] Relation: Other Secondary Emergency Contact: Mcgregory,Mark Address: [place]          [place], [place] Phone: [phone] Mobile Phone: [phone] Relation: Son     Allergies: Codeine and Trileptal [oxcarbazepine]  Chief Complaint  Patient presents with   Readmit To SNF    Readmission to [facility]    HPI: Patient is a 73 y.o. male with a AAA, osteoarthritis, asbestosis, CAD with history of STEMI consult cancer, depression, diverticulosis/diabetic colitis, GERD, glaucoma, abdominal hernia, hyperlipidemia, hypertension, IBS, peripheral vascular disease, reflex sympathetic dystrophy, stage III and IV decubitus ulcers who was brought to the emergency department due to decreased mentation and D-dimer elevated 8 times normal and liver function elevations.  Patient has been getting IV antibiotics at the facility for pneumonia.  Patient is admitted to [facility] from 10/6-12 for acute encephalopathy AKI and transaminitis.  CT PE was negative.  Patient underwent surgery for debridement of decubitus ulcer.  Patient's liver functions began to fall, patient's antibiotics were discontinued and patient was considered stable for transfer back to [facility].  While at [facility] patient will be followed for paroxysmal SVT treated with metoprolol and Eliquis, BPH treated with Proscar and Flomax and peripheral neuropathy treated with  Neurontin.  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 s/p cath in 2015 showing occluded RCA and high-grade LCx stenosis treated with DESx2 to LCx   Cancer [facility])    Depression    Diverticulitis    hospital 2011 [facility]   Diverticulosis    GERD (gastroesophageal reflux disease)    Glaucoma 2016   bilateral   Hernia of unspecified site of abdominal cavity without mention of obstruction or gangrene    hiatal   Hyperlipidemia LDL goal <70 04/20/2014   Hypertension    IBS (irritable bowel syndrome)    Myocardial infarction ([place]) 01/2014   NSTEMI   Peripheral vascular disease (HCC)    ankle brachial index of 0.79 on the right and 0.61 on the left.    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    SOB (shortness of breath)     Past Surgical History:  Procedure Laterality Date   CARDIAC CATHETERIZATION     CATARACT EXTRACTION     CORONARY ANGIOPLASTY     HERNIA REPAIR     LEFT HEART CATHETERIZATION WITH CORONARY ANGIOGRAM N/A 01/17/2014   Procedure: LEFT HEART CATHETERIZATION WITH CORONARY ANGIOGRAM;  Surgeon: Leonie Man, MD;  Location: [facility] CATH LAB;  Service: Cardiovascular;  Laterality: N/A;   left shoulder      x 3   NISSEN FUNDOPLICATION     right elbow surgery     x 2   right knee arthroscopy  squamous cell skin cancer     Left Hand   SUPRAVENTRICULAR TACHYCARDIA ABLATION N/A 01/16/2014   Procedure: SUPRAVENTRICULAR TACHYCARDIA ABLATION;  Surgeon: Evans Lance, MD;  Location: [facility] CATH LAB;  Service: Cardiovascular;  Laterality: N/A;    Allergies as of 02/16/2019      Reactions   Codeine Itching   Trileptal [oxcarbazepine]    Other reaction(s): Other (See Comments) Abnormal sodium levels Hyponatremia      Medication List     Notice   This visit is during an admission. Changes to the med list made in this visit will be reflected in the After Visit Summary of the admission.     No orders of the defined types were placed in this encounter.   Immunization History  Administered Date(s) Administered   Tdap 01/25/2019    Social History   Tobacco Use   Smoking status: Former Smoker    Packs/day: 2.50    Years: 50.00    Pack years: 125.00    Types: Cigarettes    Quit date: 05/05/2005    Years since quitting: 13.7   Smokeless tobacco: Former Systems developer    Quit date: 05/03/2012  Substance Use Topics   Alcohol use: No    Alcohol/week: 0.0 standard drinks    Comment: last use 2013    Family history is   Family History  Problem Relation Age of Onset   Colon cancer Mother    Colon cancer Maternal Aunt    Colon cancer Maternal Uncle    Heart attack Neg Hx    Stroke Neg Hx       Review of Systems  DATA OBTAINED: from patient- limited; nursing-no acute concerns GENERAL:  no fevers, fatigue, appetite changes SKIN: No itching, or rash EYES: No eye pain, redness, discharge EARS: No earache, tinnitus, change in hearing NOSE: No congestion, drainage or bleeding  MOUTH/THROAT: No mouth or tooth pain, No sore throat RESPIRATORY: No cough, wheezing, SOB CARDIAC: No chest pain, palpitations, lower extremity edema  GI: No abdominal pain, No N/V/D or constipation, No heartburn or reflux  GU: No dysuria, frequency or urgency, or incontinence  MUSCULOSKELETAL: No unrelieved bone/joint pain NEUROLOGIC: No headache, dizziness or focal weakness PSYCHIATRIC: No c/o anxiety or sadness   Vitals:   02/16/19 1109  BP: 128/64  Pulse: 100  Resp: 20  Temp: 98.7 F (37.1 C)  SpO2: 100%    SpO2 Readings from Last 1 Encounters:  02/16/19 100%   Body mass index is 27.42 kg/m.     Physical Exam  GENERAL APPEARANCE: Alert, conversant,  No acute distress.  SKIN: No diaphoresis rash HEAD:  Normocephalic, atraumatic  EYES: Conjunctiva/lids clear. Pupils round, reactive. EOMs intact.  EARS: External exam WNL, canals clear. Hearing grossly normal.  NOSE: No deformity or discharge.  MOUTH/THROAT: Lips w/o lesions  RESPIRATORY: Breathing is even, unlabored. Lung sounds are clear   CARDIOVASCULAR: Heart RRR no murmurs, rubs or gallops.  Minimal swelling of upper extremities, 2+ edema bilateral lower extremities.   GASTROINTESTINAL: Abdomen is soft, non-tender, + distended w/ normal bowel sounds, questionable fluid wave appreciated. GENITOURINARY: Bladder non tender, not distended  MUSCULOSKELETAL: No abnormal joints or musculature NEUROLOGIC:  Cranial nerves 2-12 grossly intact. Moves all extremities  PSYCHIATRIC: Mood and affect appropriate to situation, no behavioral issues  Patient Active Problem List   Diagnosis Date Noted   Sacral decubitus ulcer, stage II (HCC)    D-dimer, elevated 02/08/2019   Elevated liver enzymes 02/08/2019   Nausea vomiting  and diarrhea 02/08/2019   Acute hepatitis 02/08/2019   Physical deconditioning 02/04/2019   Pressure injury of deep tissue of left heel 01/27/2019   Atrial flutter ([place]) 01/15/2019   Hypokalemia 01/15/2019   Bilateral open angle glaucoma 01/15/2019   Anxiety disorder [id number]   Acute diastolic CHF (congestive heart failure) ([place]) 01/03/2019   Pressure ulcer of right heel, stage 2 ([place]) 01/03/2019   COPD exacerbation ([place]) 01/02/2019   Acute on chronic respiratory failure with hypoxia and hypercapnia (HCC) 01/02/2019   Acute CHF (congestive heart failure) ([place]) 01/02/2019   Hyperkalemia 01/02/2019   Sepsis ([place]) 12/15/2018   Situational mixed anxiety and depressive disorder 02/28/2018   Carotid artery disease ([place]) 02/27/2016   Acute hyponatremia 02/12/2015   Hereditary and idiopathic peripheral neuropathy 01/30/2015   Acute on chronic respiratory failure ([place]) 05/08/2014   Anemia, iron deficiency  05/08/2014   Hyponatremia 05/08/2014   Urinary retention 05/08/2014   Chronic respiratory failure with hypoxia ([place]) 04/30/2014   Shortness of breath    HCAP (healthcare-associated pneumonia) 04/24/2014   Hyperlipidemia LDL goal <70 04/20/2014   AAA (abdominal aortic aneurysm), stable 04/20/2014   Abdominal pain, generalized    SOB (shortness of breath)    Peripheral vascular disease ([place])    Presence of drug coated stent in left circumflex coronary artery: Promus DES 3.5 mm x 38 mm (3.9 mm) 01/17/2014    Class: Acute   CAD (coronary artery disease), native coronary artery    SVT (supraventricular tachycardia) s/p ablation 01/16/2014 01/16/2014   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   TIA (transient ischemic attack) 09/06/2013   HTN (hypertension) 09/05/2013   AKI (acute kidney injury) ([place]) 11/01/2012   TOBACCO ABUSE, hx 08/23/2007   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   Asbestosis ([place]) 08/12/2007   Reflex sympathetic dystrophy 05/29/2007   GERD 05/29/2007      Labs reviewed: Basic Metabolic Panel:    Component Value Date/Time   NA 138 02/14/2019 0509   K 4.1 02/14/2019 0509   CL 104 02/14/2019 0509   CO2 26 02/14/2019 0509   GLUCOSE 114 (H) 02/14/2019 0509   BUN 25 (H) 02/14/2019 0509   CREATININE 0.95 02/14/2019 0509   CALCIUM 9.2 02/14/2019 0509   PROT 5.9 (L) 02/14/2019 0509   PROT 7.6 01/30/2015 1455   ALBUMIN 2.0 (L) 02/14/2019 0509   AST 18 02/14/2019 0509   ALT 134 (H) 02/14/2019 0509   ALKPHOS 66 02/14/2019 0509   BILITOT 0.3 02/14/2019 0509   GFRNONAA >60 02/14/2019 0509   GFRAA >60 02/14/2019 0509    Recent Labs    01/03/19 0425  01/04/19 0525 01/05/19 0649  02/12/19 0622 02/13/19 0653 02/14/19 0509  NA 139  --  137 134*   < > 136 138 138  K 4.6   < > 4.1 4.1   < > 3.9 4.7 4.1  CL 94*  --  90* 93*   < > 102 103 104  CO2 35*  --  36* 36*   < > 25 26 26   GLUCOSE 144*  --  150* 129*   < > 96 123*  114*  BUN 45*  --  45* 37*   < > 24* 24* 25*  CREATININE 1.32*  --  1.11 0.98   < > 0.96 1.11 0.95  CALCIUM 8.6*  --  8.5* 8.5*   < > 8.9 9.1 9.2  MG 1.9  --  1.9 1.9  --   --   --   --    < > =  values in this interval not displayed.   Liver Function Tests: Recent Labs    02/11/19 0422 02/12/19 0622 02/14/19 0509  AST 112* 77* 18  ALT 352* 272* 134*  ALKPHOS 72 79 66  BILITOT 0.5 0.6 0.3  PROT 5.8* 6.2* 5.9*  ALBUMIN 1.9* 2.1* 2.0*   Recent Labs    02/08/19 1545  LIPASE 22  AMYLASE 16*   Recent Labs    02/09/19 1453  AMMONIA 28   CBC: Recent Labs    01/28/19 0010 02/04/19 1300  02/09/19 0930  02/12/19 0622 02/13/19 0653 02/14/19 0509  WBC 8.6 18.0*   < > 16.2*   < > 14.8* 15.5* 12.5*  NEUTROABS 6.7 14.1*  --  12.6*  --   --   --   --   HGB 11.0* 11.0*   < > 9.7*   < > 9.9* 10.3* 9.8*  HCT 36.1* 36.3*   < > 32.7*   < > 33.3* 34.6* 32.9*  MCV 93.0 92.4   < > 95.1   < > 95.4 95.8 97.1  PLT 277 545*   < > 347   < > 324 328 312   < > = values in this interval not displayed.   Lipid No results for input(s): CHOL, HDL, LDLCALC, TRIG in the last 8760 hours.  Cardiac Enzymes: Recent Labs    02/09/19 1453  CKTOTAL 91   BNP: Recent Labs    01/01/19 2327  BNP 885.0*   No results found for: [facility] Lab Results  Component Value Date   HGBA1C 6.2 (H) 09/07/2013   Lab Results  Component Value Date   TSH 2.140 02/09/2019   Lab Results  Component Value Date   [id number] 01/02/2019   Lab Results  Component Value Date   FOLATE 6.2 01/04/2019   Lab Results  Component Value Date   FERRITIN 1,701 (H) 02/08/2019    Imaging and Procedures obtained prior to SNF admission: Dg Chest 2 View  Result Date: 02/15/2019 CLINICAL DATA:  Increasing edema and shortness of breath. History of asbestosis. EXAM: CHEST - 2 VIEW COMPARISON:  CT chest 02/11/2019. PA and lateral chest 02/08/2019 and 02/04/2019. FINDINGS: There is marked cardiomegaly and interstitial  pulmonary edema. Bilateral pleural effusions are present. Extensive calcified pleural plaques are seen. Aeration appears worse than on the most recent comparison plain films. IMPRESSION: Worsened aeration since the most recent plain films likely due to pulmonary edema in this patient with cardiomegaly and small effusions. Calcified pleural plaques. Electronically Signed   By: Inge Rise M.D.   On: 02/15/2019 16:01   Dg Abd 1 View  Result Date: 02/15/2019 CLINICAL DATA:  Increasing edema and shortness of breath. EXAM: ABDOMEN - 1 VIEW COMPARISON:  None. FINDINGS: The bowel gas pattern is normal. No radio-opaque calculi or other significant radiographic abnormality are seen. Calcified pleural plaques noted. IMPRESSION: Negative exam. Electronically Signed   By: Inge Rise M.D.   On: 02/15/2019 16:02   Mr Brain Wo Contrast  Result Date: 02/14/2019 CLINICAL DATA:  Altered level of consciousness EXAM: MRI HEAD WITHOUT CONTRAST TECHNIQUE: Multiplanar, multiecho pulse sequences of the brain and surrounding structures were obtained without intravenous contrast. COMPARISON:  09/07/2013 FINDINGS: Brain: No acute infarction, hemorrhage, hydrocephalus, or masslike finding. Mild asymmetric accumulation of CSF along the left cerebellum, stable. Mild to moderate chronic small vessel ischemic type change in the deep cerebral white matter, not significantly progressed although there has been an interval lacunar infarct at the left  caudate. Brain atrophy that is generalized and shows only mild progression from 2015. Vascular: Preserved flow voids. Skull and upper cervical spine: No evidence of marrow lesion Sinuses/Orbits: Negative Other: Significant and progressive motion degradation. Diffusion imaging is diagnostic IMPRESSION: 1. Significant and progressive motion degradation. 2. Diffusion imaging is diagnostic and negative for acute infarct. 3. Senescent changes without significant progression since 2015.  Electronically Signed   By: Monte Fantasia M.D.   On: 02/14/2019 11:13     Not all labs, radiology exams or other studies done during hospitalization come through on my EPIC note; however they are reviewed by me.    Assessment and Plan  Transaminitis/acute hepatitis/acute encephalopathy -cause unknown; resolving SNF-patient dated back to skilled nursing facility for OT/PT; repeat CMP for resolution complete of elevated liver function  AKI-with a creatinine up to 1.7, with IV fluids return to 0.95 SNF-follow-up BMP  Elevated D-dimer-8 times normal-CT PE negative  HCAP-patient has been treated with antibiotics at SNF and course was completed while in hospital  Decubitus ulcer sacrum/pressure ulcer of right and left heel -General surgery consulted and wound was debrided SNF-continue wound care  Anasarca SNF -noted to return to SNF with upper extremity edema and lower extremity edema and rails; much improved today after Demadex 60 mg yesterday with minimal upper extremity edema and 2+ edema lower extremities; follow-up BMP to follow renal function daily continue Demadex 20 mg daily  SVT status post ablation 01/2014 SNF-continue Eliquis 5 mg twice daily and metoprolol 25 mg twice daily; of note patient's INR in the hospital was 2.4 which is extremely odd as his hepatitis was not nearly severe enough to cause such a change  Peripheral neuropathy SNF-continue gabapentin 800 mg 3 times daily for bilateral leg pain  BPH SNF-no signs of obstruction; continue Proscar 5 mg and Flomax 0.4 mg daily   Time spent greater than 35 minutes;> 50% of time with patient was spent reviewing records, labs, tests and studies, counseling and developing plan of care  Hennie Duos, MD

## 2019-02-20 ENCOUNTER — Encounter: Payer: Self-pay | Admitting: Internal Medicine

## 2019-02-20 DIAGNOSIS — G9340 Encephalopathy, unspecified: Secondary | ICD-10-CM | POA: Insufficient documentation

## 2019-02-20 DIAGNOSIS — R601 Generalized edema: Secondary | ICD-10-CM | POA: Insufficient documentation

## 2019-02-20 DIAGNOSIS — N401 Enlarged prostate with lower urinary tract symptoms: Secondary | ICD-10-CM | POA: Insufficient documentation

## 2019-02-20 DIAGNOSIS — N138 Other obstructive and reflux uropathy: Secondary | ICD-10-CM | POA: Insufficient documentation

## 2019-02-21 DIAGNOSIS — L89610 Pressure ulcer of right heel, unstageable: Secondary | ICD-10-CM | POA: Insufficient documentation

## 2019-02-21 DIAGNOSIS — L89320 Pressure ulcer of left buttock, unstageable: Secondary | ICD-10-CM | POA: Insufficient documentation

## 2019-02-21 DIAGNOSIS — L89620 Pressure ulcer of left heel, unstageable: Secondary | ICD-10-CM | POA: Insufficient documentation

## 2019-02-22 ENCOUNTER — Non-Acute Institutional Stay (SKILLED_NURSING_FACILITY): Payer: Medicare Other | Admitting: Adult Health

## 2019-02-22 ENCOUNTER — Encounter (HOSPITAL_COMMUNITY)
Admission: RE | Admit: 2019-02-22 | Discharge: 2019-02-22 | Disposition: A | Discharge status: Home / Self Care | Payer: Medicare Other | Source: Skilled Nursing Facility | Attending: Internal Medicine | Admitting: Internal Medicine

## 2019-02-22 ENCOUNTER — Encounter: Payer: Self-pay | Admitting: Adult Health

## 2019-02-22 DIAGNOSIS — I5033 Acute on chronic diastolic (congestive) heart failure: Secondary | ICD-10-CM | POA: Insufficient documentation

## 2019-02-22 DIAGNOSIS — K219 Gastro-esophageal reflux disease without esophagitis: Secondary | ICD-10-CM | POA: Insufficient documentation

## 2019-02-22 DIAGNOSIS — J4489 Other specified chronic obstructive pulmonary disease: Secondary | ICD-10-CM

## 2019-02-22 DIAGNOSIS — N179 Acute kidney failure, unspecified: Secondary | ICD-10-CM | POA: Insufficient documentation

## 2019-02-22 DIAGNOSIS — J9622 Acute and chronic respiratory failure with hypercapnia: Secondary | ICD-10-CM

## 2019-02-22 DIAGNOSIS — I471 Supraventricular tachycardia, unspecified: Secondary | ICD-10-CM

## 2019-02-22 DIAGNOSIS — J449 Chronic obstructive pulmonary disease, unspecified: Secondary | ICD-10-CM

## 2019-02-22 DIAGNOSIS — I4892 Unspecified atrial flutter: Secondary | ICD-10-CM | POA: Diagnosis not present

## 2019-02-22 DIAGNOSIS — J441 Chronic obstructive pulmonary disease with (acute) exacerbation: Secondary | ICD-10-CM | POA: Insufficient documentation

## 2019-02-22 DIAGNOSIS — R7401 Elevation of levels of liver transaminase levels: Secondary | ICD-10-CM | POA: Insufficient documentation

## 2019-02-22 DIAGNOSIS — J9621 Acute and chronic respiratory failure with hypoxia: Secondary | ICD-10-CM | POA: Diagnosis not present

## 2019-02-22 DIAGNOSIS — G609 Hereditary and idiopathic neuropathy, unspecified: Secondary | ICD-10-CM

## 2019-02-22 LAB — BASIC METABOLIC PANEL
Anion gap: 10 (ref 5–15)
BUN: 13 mg/dL (ref 8–23)
CO2: 40 mmol/L — ABNORMAL HIGH (ref 22–32)
Calcium: 8.8 mg/dL — ABNORMAL LOW (ref 8.9–10.3)
Chloride: 90 mmol/L — ABNORMAL LOW (ref 98–111)
Creatinine, Ser: 1.01 mg/dL (ref 0.61–1.24)
GFR calc Af Amer: >60 mL/min (ref >60)
GFR calc non Af Amer: >60 mL/min (ref >60)
Glucose, Bld: 100 mg/dL — ABNORMAL HIGH (ref 70–99)
Potassium: 4 mmol/L (ref 3.5–5.1)
Sodium: 140 mmol/L (ref 135–145)

## 2019-02-22 NOTE — Progress Notes (Signed)
Location:    [place] Room Number: 157/D Place of Service:  SNF (31)   CODE STATUS: Full Code  Allergies  Allergen Reactions  . Codeine Itching  . Trileptal [Oxcarbazepine]     Other reaction(s): Other (See Comments) Abnormal sodium levels Hyponatremia   Chief Complaint  Patient presents with  . 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      HPI:  He is a short term rehab patient being seen for the management of his chronic illnesses: resp failure; copd; atrial flutter peripheral neuropathy.  . He states that he is feeling better today. His breathing is easier. He denies any cough; he continues to have poor tolerance to activity. There is less edema present. There are no reports of fevers present.   Past Medical History:  Diagnosis Date  . AAA (abdominal aortic aneurysm), stable 04/20/2014  . Arthritis   . 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 CAD (coronary artery disease)    a. s/p cath in 2015 showing occluded RCA and high-grade LCx stenosis treated with DESx2 to LCx  . Cancer ([place])   . Depression   . Diverticulitis    hospital 2011 [facility]  . Diverticulosis   . GERD (gastroesophageal reflux disease)   . 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 Hernia of unspecified site of abdominal cavity without mention of obstruction or gangrene    hiatal  . Hyperlipidemia LDL goal <70 04/20/2014  . Hypertension   . IBS (irritable bowel syndrome)   . Myocardial infarction ([place]) 01/2014   NSTEMI  . Peripheral vascular disease (H[facility])    ankle brachial index of 0.79 on the right and 0.61 on the left.   . Pneumonia   . 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 Reflex sympathetic dystrophy   . 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 SOB (shortness of breath)     Past Surgical History:  Procedure Laterality Date  . CARDIAC CATHETERIZATION    . CATARACT EXTRACTION    . CORONARY ANGIOPLASTY    . HERNIA REPAIR    . LEFT HEART CATHETERIZATION WITH CORONARY ANGIOGRAM N/A 01/17/2014   Procedure: LEFT HEART CATHETERIZATION WITH CORONARY ANGIOGRAM;  Surgeon: Leonie Man, MD;  Location: [facility] CATH LAB;  Service: Cardiovascular;  Laterality: N/A;  . left shoulder      x 3  . NISSEN FUNDOPLICATION    . right elbow surgery     x 2  . right knee arthroscopy    . squamous cell skin cancer     Left Hand  . SUPRAVENTRICULAR TACHYCARDIA ABLATION N/A 01/16/2014   Procedure: SUPRAVENTRICULAR TACHYCARDIA ABLATION;  Surgeon: Evans Lance, MD;  Location: [facility] CATH LAB;  Service: Cardiovascular;  Laterality: N/A;    Social History   Socioeconomic History  . Marital status: Widowed    Spouse name: Not on file  . Number of children: 2  . Years of education: 48  . Highest education level: Not on file  Occupational History  . Occupation: [employer]  . Financial resource strain: Not on file  . Food insecurity    Worry: Not on file    Inability: Not on file  . Transportation needs  Medical: Not on file    Non-medical: Not on file  Tobacco Use  . Smoking status: Former Smoker    Packs/day: 2.50    Years: 50.00    Pack years: 125.00    Types: Cigarettes    Quit date: 05/05/2005    Years since quitting: 13.8  . Smokeless tobacco: Former Systems developer    Quit date: 05/03/2012  Substance and Sexual Activity  . Alcohol use: No    Alcohol/week: 0.0 standard drinks    Comment: last use 2013  . Drug use: No  . Sexual activity: Not on file    Comment: widowed, Veteran  Lifestyle  . Physical activity    Days per week: Not on file    Minutes per session: Not on file  . Stress: Not on file  Relationships  . Social Herbalist on phone: Not on file    Gets  together: Not on file    Attends religious service: Not on file    Active member of club or organization: Not on file    Attends meetings of clubs or organizations: Not on file    Relationship status: Not on file  . Intimate partner violence    Fear of current or ex partner: Not on file    Emotionally abused: Not on file    Physically abused: Not on file    Forced sexual activity: Not on file  Other Topics Concern  . Not on file  Social History Narrative   Patient drinks 3 cups of caffeine daily.   Patient is right handed.   Family History  Problem Relation Age of Onset  . Colon cancer Mother   . Colon cancer Maternal Aunt   . Colon cancer Maternal Uncle   . Heart attack Neg Hx   . Stroke Neg Hx       VITAL SIGNS BP 121/76   Pulse 86   Temp (!) 97.3 F (36.3 C) (Oral)   Resp 20   Ht 6' (1.829 m)   Wt 207 lb 6.4 oz (94.1 kg)   PF 92 L/min   BMI 28.13 kg/m   Outpatient Encounter Medications as of 02/22/2019  Medication Sig  . alprazolam (XANAX) 2 MG tablet Take 2 mg by mouth 2 (two) times daily.  . Amino Acids-Protein Hydrolys (FEEDING SUPPLEMENT, PRO-STAT SUGAR FREE 64,) LIQD Take 30 mLs by mouth 2 (two) times daily between meals.   [name] amitriptyline (ELAVIL) 100 MG tablet Take 100 mg by mouth at bedtime.  [name] apixaban (ELIQUIS) 2.5 MG TABS tablet Take 5 mg by mouth 2 (two) times daily.   Roseanne Kaufman [place]-Castor Oil (VENELEX) OINT Apply 1 application topically See admin instructions. Apply to coccyx, sacrum, bilateral buttocks each shift and prn  . bisacodyl (DULCOLAX) 5 MG EC tablet Take 5 mg by mouth daily as needed for mild constipation or moderate constipation.  . budesonide-formoterol (SYMBICORT) 160-4.5 MCG/ACT inhaler Inhale 2 puffs into the lungs 2 (two) times daily.  . camphor-menthol (ANTI-ITCH) lotion Apply 1 application topically 2 (two) times daily as needed for itching. Sparingly  . clotrimazole (LOTRIMIN) 1 % cream Instructions: apply to buttock rash three  times daily Three Times A Day 09:00 AM, 02:00 PM, 09:00 PM  . collagenase (SANTYL) ointment Apply 1 application topically daily. Apply per treatment orders  . docusate sodium (COLACE) 100 MG capsule Take 100 mg by mouth daily as needed for mild constipation.  . finasteride (PROSCAR) 5 MG tablet Take 5 mg  by mouth every morning.   . gabapentin (NEURONTIN) 800 MG tablet Take 1 tablet (800 mg total) by mouth 3 (three) times daily.  [name] guaiFENesin (MUCINEX) 600 MG 12 hr tablet Take 600 mg by mouth 2 (two) times daily.   [name] ipratropium-albuterol (DUONEB) 0.5-2.5 (3) MG/3ML SOLN Take 3 mLs by nebulization every 4 (four) hours as needed (For wheezing and worsening shortness of breath or respiratory distress).  [name] latanoprost (XALATAN) 0.005 % ophthalmic solution Place 1 drop into both eyes at bedtime.  . metoprolol tartrate (LOPRESSOR) 25 MG tablet Take 25 mg by mouth 2 (two) times daily.   . Multiple Vitamins-Minerals (MULTIVITAMIN WITH MINERALS) tablet Take 1 tablet by mouth daily.  . nitroGLYCERIN (NITROSTAT) 0.4 MG SL tablet Place 1 tablet (0.4 mg total) under the tongue every 5 (five) minutes x 3 doses as needed for chest pain.  . NON FORMULARY Diet: Regular, NAS, Consistent Carbohydrate  . ondansetron (ZOFRAN) 4 MG tablet Take 4 mg by mouth every 6 (six) hours.   . OXYGEN Oxygen @@ 3L/mn via [place] Special Instructions: Document O2 sat qshift. Every Shift Day, Evening, Night  . spironolactone (ALDACTONE) 25 MG tablet Take 25 mg by mouth daily.  . tamsulosin (FLOMAX) 0.4 MG CAPS capsule Take 0.4 mg by mouth at bedtime.  . torsemide (DEMADEX) 20 MG tablet Take 20 mg by mouth daily.  . [DISCONTINUED] Ensure Max Protein (ENSURE MAX PROTEIN) LIQD Take 330 mLs (11 oz total) by mouth 2 (two) times daily.  . [DISCONTINUED] lamoTRIgine (LAMICTAL) 100 MG tablet Take 1 tablet (100 mg total) by mouth 2 (two) times daily.   No facility-administered encounter medications on file as of 02/22/2019.       SIGNIFICANT DIAGNOSTIC EXAMS    PREVIOUS;   01-01-19: chest x-ray:  1. Cardiomegaly with vascular congestion. 2. Small bilateral pleural effusions and bibasilar densities similar to prior radiograph  01-02-19: bilateral lower extremity venous doppler:  1. No evidence of deep venous thrombosis in either lower extremity. 2. Increased pulsatility of the venous waveforms suggests elevated right heart pressures. Findings can be seen in tricuspid regurgitation, right heart failure, pulmonary hypertension and COPD.  01-02-19: 2-d echo:   1. The left ventricle has normal systolic function, with an ejection fraction of 55-60%. The cavity size was normal. There is mildly increased left ventricular wall thickness. Left ventricular diastolic Doppler parameters are indeterminate.  2. The right ventricle has normal systolic function. The cavity was normal. There is no increase in right ventricular wall thickness. Right ventricular systolic pressure could not be assessed.  3. No evidence of mitral valve stenosis.  4. The aortic valve is tricuspid. Mild thickening of the aortic valve. Mild calcification of the aortic valve. No stenosis of the aortic valve.  5. The aorta is normal unless otherwise noted.  6. The ascending aorta is normal in size and structure.  01-03-19: chest x-ray: Stable cardiomegaly, pulmonary vascular congestion, and bilateral pleural-parenchymal scarring. No acute findings.  01-28-19: chest x-ray: Similar appearance of reticulonodular opacities in the mid and lower lungs, which has progressed slowly from 2016 on chest x-ray exams. While the changes most likely represent chronic interstitial fibrosis/scarring, superimposed infection cannot be excluded radiographically   02-04-19: chest x-ray: Extensive bilateral calcified pleural plaques. Chronic increased markings throughout the lungs, favor scarring. No definite acute process   02-08-19: chest x-ray; Unchanged reticulonodular opacities  throughout both lungs, likely due to chronic lung changes, however cannot exclude superimposed infectious etiology  02-08-19: ct of abdomen and  pelvis;  1.  No acute intra-abdominal process. 2. New small ascites. 3. Unchanged 3.2 cm infrarenal abdominal aortic aneurysm. Recommend followup by ultrasound in 3 years. T . Aortic aneurysm  4.  Aortic atherosclerosis   02-09-19: right upper quad ultrasound: Gallbladder distention is noted without cholelithiasis. No gallbladder wall thickening or pericholecystic fluid is noted. No other abnormality seen in the right upper quadrant of the abdomen.  02-11-19: ct angio of chest:  1.  Negative examination for pulmonary embolism. 2. There are large, bilateral calcified pleural plaques throughout. Small bilateral pleural effusions, generally loculated and chronic appearing. 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 3. Bulky mediastinal and hilar lymphadenopathy, similar to prior examination dated 04/24/2014, largest right paratracheal lymph nodes measuring 2.4 x 2.4 cm (series 4, image 25), of uncertain significance given long-term stability, perhaps reactive to extensive pleural disease. 4.  Cardiomegaly and coronary artery disease. 5. Enlargement of the main pulmonary artery, as can be seen in pulmonary hypertension. 6.  Ascites in the included upper abdomen. 7.  Aortic Atherosclerosis  02-14-19: MRI of brain 1. Significant and progressive motion degradation. 2. Diffusion imaging is diagnostic and negative for acute infarct. 3. Senescent changes without significant progression since 2015.  NO NEW EXAMS.     LABS REVIEWED PREVIOUS;   01-01-19: wbc 8.2; hgb 14.2; hct 51.1; mcv 103.4; plt 216; glucose 100; bun 46; creat 1.88; k+ 5.8; na++ 142; ca 8.7; liver normal albumin 3.1 01-02-19: wbc 5.3; hgb 145; hct 51.8; mcv 101.2; plt 219; glucose 132; bun 45; creat 1.52; k+ 4.7; na++ 143; ca 9.0 ;liver normal albumin 3.3; tsh 0.435 vit B 12; 296;  urine culture: staphylococcus species 01-05-19: glucose 129; bun 37; creat 0.98; k+ 4.1; na++ 134; ca 8.5; mag 1.9 01-10-19: glucose 111; bun 38; creat 0.85; k+ 3.5; na++ 130; ca 8.7  01-28-19: wbc 8.6; hgb 11.0 hct 36.1; mcv 93.0 plt 277; glucose 125; bun 20; creat 1.08; k+ 4.5; na++ 134; ca 8.9 blood and urine cultures: no growth. 02-04-19: wbc 18.0; hgb 11.0; hct 36.3; mcv 92.4; plt 545 02-08-19: wbc 27.4; hgb 10.4; hct 34.0; mcv 93.7; plt 478; glucose 201; bun 36; creat 2.31; k+ 4.8; na++ 131; ca 8.7; ast 1485.0; alt 846.0; albumin 2.1; amalse 16; lipase 22; direct bili 0.1 d-dimer: 4.15  c-diff: neg  Hepatitis panel: neg 02-09-19: wbc 16.2; hgb 9.7; hct 32.7; mcv 95.1; plt 347; glucose 106; bun 37; creat 2.14; k+ 4.0; na++ 133; ca 8.1; ast 651; alt 801; albumin 2.0; tsh 2.140; INR 2.4 02-12-19: wbc 14.8; hgb 9.9; hct 33.3; mcv 95.4; plt 324; glucose 96; bun 24; creat 0.96; k+ 3.9; na++ 136; ca 8.9; ast 77 alt 272; albumin 2.1 02-14-19: wbc 12.5; hgb 9.8; hct 32.9; mcv 97.1 plt 312; glucose 114; bun 25; creat 0.95; k+ 4.1; na++ 138; ca 9.2; ast 18; alt 134; albumin 2.0  TODAY  02-15-19: INR 1.6; d-dimer: 3.24 02-22-19: glucose 100; bun 13; creat 1.01; k+ 4.0; na++ 140; ca 8.8     Review of Systems  Constitutional: Positive for malaise/fatigue.  Respiratory: Negative for cough and shortness of breath.   Cardiovascular: Positive for leg swelling. Negative for chest pain and palpitations.  Gastrointestinal: Negative for abdominal pain, constipation and heartburn.  Musculoskeletal: Negative for back pain, joint pain and myalgias.  Skin: Negative.   Neurological: Negative for dizziness.  Psychiatric/Behavioral: The patient is not nervous/anxious.     Physical Exam Constitutional:      General: He is not in  acute distress.    Appearance: He is well-developed. He is not diaphoretic.  Eyes:     Comments: History of cataract extraction     Neck:     Musculoskeletal: Neck supple.     Thyroid: No  thyromegaly.  Cardiovascular:     Rate and Rhythm: Normal rate. Rhythm irregular.     Pulses: Normal pulses.     Heart sounds: Normal heart sounds.     Comments: History of SVT ablation; ptca  Pedal pulses present Feet cool to touch Pulmonary:     Effort: No respiratory distress.     Breath sounds: Normal breath sounds.     Comments: 02 dependent Bilateral lobes diminished sounds  Abdominal:     General: Bowel sounds are normal. There is no distension.     Palpations: Abdomen is soft.     Tenderness: There is no abdominal tenderness.  Genitourinary:    Comments: foley Musculoskeletal:     Right lower leg: Edema present.     Left lower leg: Edema present.     Comments: Trace bilateral lower extremity edema Is able to move all extremities   Lymphadenopathy:     Cervical: No cervical adenopathy.  Skin:    General: Skin is warm and dry.     Comments: Left heel: unstaged soft eschar: 7 x 8 x 0.1cm Right heel unstaged: soft eschar: 5 x 4 cm Left buttock: unstaged: soft eschar: 5 x 5.5 x 1.5 cm with 1.5 cm undermining present No signs of infection present   Neurological:     Mental Status: He is alert and oriented to person, place, and time.  Psychiatric:        Mood and Affect: Mood normal.         ASSESSMENT/ PLAN:  TODAY ;  1. Acute on chronic respiratory failure with hypoxia and hypercapnia/copd with chronic bronchitis asbestosis is stable 02 dependent will continue mucinex 600 mg twice daily duoneb every 4 hours as needed symbicort 160/4.5 mcg 2 puffs twice daily   2. Atrial flutter (SVT (suprventricular tachycardia) s/p ablation heart rate is stable will continue lopressor 25 mg twice daily for rate control elqius 5 mg twice daily   3. Peripheral vascular disease/heredity and idiopathic peripheral neuropathy: is stable will continue 100 mg nightly and neurontin 800 mg three times daily   PREVIOUS  8.  Urinary retention is stable has foley has failed 2 voiding  trial; awaiting urology consult will continue flomax 0.4 mg daily and proscar 5 mg daily   9. Hyperlipidemia LDL goal <70: has been taken off lipitor due to liver function will monitor   10. Protein calorie malnutrition severe: albumin 2.0 will continue prostat twice daily and supplements as directed will monitor  11. 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 GERD without esophagitis: is stable at this time will continue zofram 4 mg every 6 hours for nausea  13. Unstagable pressure ulcer of left buttock/decubitus ulcer bilateral heels unstagable: will continue current treatments and supplements.   14. Elevated liver enzymes; acute hepatitis; elevated d-dimer: will check INR and d-dimer; will monitor his status.   15. Acute diastolic CHF (congestive heart failure) EF 55-60% (12-06-18) will begin demadex 20 mg daily with k+ 10 meq daily lopressor 25 mg twice daily has ntg as needed for chest pain.   16. Hypokalemia: is stable k+ 4.0 will continue k+ 10 meq daily   17. Generalized anxiety disorder: is stable  will continue xanax 2 mg twice daily      MD is aware of resident's narcotic use and is in agreement with current plan of care. We will attempt to wean resident as appropriate.  Ok Edwards NP [facility]  Contact [phone] Monday through Friday 8am- 5pm  After hours call [phone]

## 2019-02-23 ENCOUNTER — Encounter: Payer: Self-pay | Admitting: Adult Health

## 2019-02-23 ENCOUNTER — Non-Acute Institutional Stay (SKILLED_NURSING_FACILITY): Payer: Medicare Other | Admitting: Adult Health

## 2019-02-23 ENCOUNTER — Other Ambulatory Visit: Payer: Self-pay | Admitting: Adult Health

## 2019-02-23 DIAGNOSIS — I4892 Unspecified atrial flutter: Secondary | ICD-10-CM

## 2019-02-23 DIAGNOSIS — J449 Chronic obstructive pulmonary disease, unspecified: Secondary | ICD-10-CM | POA: Diagnosis not present

## 2019-02-23 DIAGNOSIS — J9622 Acute and chronic respiratory failure with hypercapnia: Secondary | ICD-10-CM

## 2019-02-23 DIAGNOSIS — J9621 Acute and chronic respiratory failure with hypoxia: Secondary | ICD-10-CM

## 2019-02-23 DIAGNOSIS — I5031 Acute diastolic (congestive) heart failure: Secondary | ICD-10-CM

## 2019-02-23 MED ORDER — ALPRAZOLAM 2 MG PO TABS: 2.0000 mg | ORAL_TABLET | Freq: Two times a day (BID) | ORAL | 0 refills | Status: DC

## 2019-02-23 NOTE — Progress Notes (Signed)
Location:  [place] [facility] Room Number: 157-D Place of Service:  SNF (31)   CODE STATUS: Full Code  Allergies  Allergen Reactions  . Codeine Itching  . Trileptal [Oxcarbazepine]     Other reaction(s): Other (See Comments) Abnormal sodium levels Hyponatremia    Chief Complaint  Patient presents with  . Acute Visit    Patient is seen for change in status.    HPI:  He is more short of breath and has tachycardia. He does have wheezing present. There are no reports of fevers present. He is having more shortness of breath present. There are no reports of fevers. He is po intake is poor. He is having increased difficulty maintaining his 02 sat. His covid tests have been negative.    Past Medical History:  Diagnosis Date  . AAA (abdominal aortic aneurysm), stable 04/20/2014  . Arthritis   . 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 CAD (coronary artery disease)    a. s/p cath in 2015 showing occluded RCA and high-grade LCx stenosis treated with DESx2 to LCx  . Cancer ([place])   . Depression   . Diverticulitis    hospital 2011 [facility]  . Diverticulosis   . GERD (gastroesophageal reflux disease)   . 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 Hernia of unspecified site of abdominal cavity without mention of obstruction or gangrene    hiatal  . Hyperlipidemia LDL goal <70 04/20/2014  . Hypertension   . IBS (irritable bowel syndrome)   . Myocardial infarction ([place]) 01/2014   NSTEMI  . Peripheral vascular disease (HCC)    ankle brachial index of 0.79 on the right and 0.61 on the left.   . Pneumonia   . 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 Reflex sympathetic dystrophy   . 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 SOB (shortness of breath)     Past Surgical History:  Procedure Laterality Date  . CARDIAC CATHETERIZATION    . CATARACT EXTRACTION    . CORONARY ANGIOPLASTY    . HERNIA REPAIR    . LEFT HEART CATHETERIZATION WITH CORONARY ANGIOGRAM N/A 01/17/2014   Procedure: LEFT HEART  CATHETERIZATION WITH CORONARY ANGIOGRAM;  Surgeon: Leonie Man, MD;  Location: [facility] CATH LAB;  Service: Cardiovascular;  Laterality: N/A;  . left shoulder      x 3  . NISSEN FUNDOPLICATION    . right elbow surgery     x 2  . right knee arthroscopy    . squamous cell skin cancer     Left Hand  . SUPRAVENTRICULAR TACHYCARDIA ABLATION N/A 01/16/2014   Procedure: SUPRAVENTRICULAR TACHYCARDIA ABLATION;  Surgeon: Evans Lance, MD;  Location: [facility] CATH LAB;  Service: Cardiovascular;  Laterality: N/A;    Social History   Socioeconomic History  . Marital status: Widowed    Spouse name: Not on file  . Number of children: 2  . Years of education: 61  . Highest education level: Not on file  Occupational History  . Occupation:  [employer]  . Financial resource strain: Not on file  . Food insecurity    Worry: Not on file    Inability: Not on file  . Transportation needs    Medical: Not on file    Non-medical: Not on file  Tobacco Use  . Smoking status: Former Smoker    Packs/day: 2.50    Years: 50.00    Pack years: 125.00    Types: Cigarettes    Quit date:  05/05/2005    Years since quitting: 13.8  . Smokeless tobacco: Former Systems developer    Quit date: 05/03/2012  Substance and Sexual Activity  . Alcohol use: No    Alcohol/week: 0.0 standard drinks    Comment: last use 2013  . Drug use: No  . Sexual activity: Not on file    Comment: widowed, Veteran  Lifestyle  . Physical activity    Days per week: Not on file    Minutes per session: Not on file  . Stress: Not on file  Relationships  . Social Herbalist on phone: Not on file    Gets together: Not on file    Attends religious service: Not on file    Active member of club or organization: Not on file    Attends meetings of clubs or organizations: Not on file    Relationship status: Not on file  . Intimate partner violence    Fear of current or ex partner: Not on file    Emotionally abused: Not on file     Physically abused: Not on file    Forced sexual activity: Not on file  Other Topics Concern  . Not on file  Social History Narrative   Patient drinks 3 cups of caffeine daily.   Patient is right handed.   Family History  Problem Relation Age of Onset  . Colon cancer Mother   . Colon cancer Maternal Aunt   . Colon cancer Maternal Uncle   . Heart attack Neg Hx   . Stroke Neg Hx       VITAL SIGNS BP 110/60   Pulse 80   Temp (!) 97.1 F (36.2 C) (Oral)   Resp 20   Ht 6' (1.829 m)   Wt 207 lb 6.4 oz (94.1 kg)   SpO2 92%   BMI 28.13 kg/m   Outpatient Encounter Medications as of 02/23/2019  Medication Sig  . alprazolam (XANAX) 2 MG tablet Take 2 mg by mouth 2 (two) times daily.  . Amino Acids-Protein Hydrolys (FEEDING SUPPLEMENT, PRO-STAT SUGAR FREE 64,) LIQD Take 30 mLs by mouth 2 (two) times daily between meals.   [name] amitriptyline (ELAVIL) 100 MG tablet Take 100 mg by mouth at bedtime.  [name] apixaban (ELIQUIS) 2.5 MG TABS tablet Take 5 mg by mouth 2 (two) times daily.   Roseanne Kaufman [place]-Castor Oil (VENELEX) OINT Apply 1 application topically See admin instructions. Apply to coccyx, sacrum, bilateral buttocks each shift and prn  . bisacodyl (DULCOLAX) 5 MG EC tablet Take 5 mg by mouth daily as needed for mild constipation or moderate constipation.  . budesonide-formoterol (SYMBICORT) 160-4.5 MCG/ACT inhaler Inhale 2 puffs into the lungs 2 (two) times daily.  . camphor-menthol (ANTI-ITCH) lotion Apply 1 application topically 2 (two) times daily as needed for itching. Sparingly  . clotrimazole (LOTRIMIN) 1 % cream Instructions: apply to buttock rash three times daily Three Times A Day 09:00 AM, 02:00 PM, 09:00 PM  . collagenase (SANTYL) ointment Apply 1 application topically daily. Apply to left buttock wound per treatment orders  . docusate sodium (COLACE) 100 MG capsule Take 100 mg by mouth daily as needed for mild constipation.  . finasteride (PROSCAR) 5 MG tablet Take 5 mg by mouth  every morning.   . gabapentin (NEURONTIN) 800 MG tablet Take 1 tablet (800 mg total) by mouth 3 (three) times daily.  [name] guaiFENesin (MUCINEX) 600 MG 12 hr tablet Take 600 mg by mouth 2 (two) times daily.   [name]  ipratropium-albuterol (DUONEB) 0.5-2.5 (3) MG/3ML SOLN Take 3 mLs by nebulization every 4 (four) hours as needed (For wheezing and worsening shortness of breath or respiratory distress).  [name] latanoprost (XALATAN) 0.005 % ophthalmic solution Place 1 drop into both eyes at bedtime.  . metoprolol tartrate (LOPRESSOR) 25 MG tablet Take 25 mg by mouth 2 (two) times daily.   . Multiple Vitamins-Minerals (MULTIVITAMIN WITH MINERALS) tablet Take 1 tablet by mouth daily.  . nitroGLYCERIN (NITROSTAT) 0.4 MG SL tablet Place 1 tablet (0.4 mg total) under the tongue every 5 (five) minutes x 3 doses as needed for chest pain.  . NON FORMULARY Diet: Regular, NAS, Consistent Carbohydrate  . ondansetron (ZOFRAN) 4 MG tablet Take 4 mg by mouth every 6 (six) hours.   . OXYGEN Oxygen @@ 3L/mn via [place] Special Instructions: Document O2 sat qshift. Every Shift Day, Evening, Night  . spironolactone (ALDACTONE) 25 MG tablet Take 25 mg by mouth daily.  . tamsulosin (FLOMAX) 0.4 MG CAPS capsule Take 0.4 mg by mouth at bedtime.  . torsemide (DEMADEX) 20 MG tablet Take 20 mg by mouth daily.  . [DISCONTINUED] lamoTRIgine (LAMICTAL) 100 MG tablet Take 1 tablet (100 mg total) by mouth 2 (two) times daily.   No facility-administered encounter medications on file as of 02/23/2019.      SIGNIFICANT DIAGNOSTIC EXAMS   PREVIOUS;   01-01-19: chest x-ray:  1. Cardiomegaly with vascular congestion. 2. Small bilateral pleural effusions and bibasilar densities similar to prior radiograph  01-02-19: bilateral lower extremity venous doppler:  1. No evidence of deep venous thrombosis in either lower extremity. 2. Increased pulsatility of the venous waveforms suggests elevated right heart pressures. Findings can be seen in  tricuspid regurgitation, right heart failure, pulmonary hypertension and COPD.  01-02-19: 2-d echo:   1. The left ventricle has normal systolic function, with an ejection fraction of 55-60%. The cavity size was normal. There is mildly increased left ventricular wall thickness. Left ventricular diastolic Doppler parameters are indeterminate.  2. The right ventricle has normal systolic function. The cavity was normal. There is no increase in right ventricular wall thickness. Right ventricular systolic pressure could not be assessed.  3. No evidence of mitral valve stenosis.  4. The aortic valve is tricuspid. Mild thickening of the aortic valve. Mild calcification of the aortic valve. No stenosis of the aortic valve.  5. The aorta is normal unless otherwise noted.  6. The ascending aorta is normal in size and structure.  01-03-19: chest x-ray: Stable cardiomegaly, pulmonary vascular congestion, and bilateral pleural-parenchymal scarring. No acute findings.  01-28-19: chest x-ray: Similar appearance of reticulonodular opacities in the mid and lower lungs, which has progressed slowly from 2016 on chest x-ray exams. While the changes most likely represent chronic interstitial fibrosis/scarring, superimposed infection cannot be excluded radiographically   02-04-19: chest x-ray: Extensive bilateral calcified pleural plaques. Chronic increased markings throughout the lungs, favor scarring. No definite acute process   02-08-19: chest x-ray; Unchanged reticulonodular opacities throughout both lungs, likely due to chronic lung changes, however cannot exclude superimposed infectious etiology  02-08-19: ct of abdomen and pelvis;  1.  No acute intra-abdominal process. 2. New small ascites. 3. Unchanged 3.2 cm infrarenal abdominal aortic aneurysm. Recommend followup by ultrasound in 3 years. T . Aortic aneurysm  4.  Aortic atherosclerosis   02-09-19: right upper quad ultrasound: Gallbladder distention is noted  without cholelithiasis. No gallbladder wall thickening or pericholecystic fluid is noted. No other abnormality seen in the right upper quadrant of  the abdomen.  02-11-19: ct angio of chest:  1.  Negative examination for pulmonary embolism. 2. There are large, bilateral calcified pleural plaques throughout. Small bilateral pleural effusions, generally loculated and chronic appearing. 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 3. Bulky mediastinal and hilar lymphadenopathy, similar to prior examination dated 04/24/2014, largest right paratracheal lymph nodes measuring 2.4 x 2.4 cm (series 4, image 25), of uncertain significance given long-term stability, perhaps reactive to extensive pleural disease. 4.  Cardiomegaly and coronary artery disease. 5. Enlargement of the main pulmonary artery, as can be seen in pulmonary hypertension. 6.  Ascites in the included upper abdomen. 7.  Aortic Atherosclerosis  02-14-19: MRI of brain 1. Significant and progressive motion degradation. 2. Diffusion imaging is diagnostic and negative for acute infarct. 3. Senescent changes without significant progression since 2015.  NO NEW EXAMS.     LABS REVIEWED PREVIOUS;   01-01-19: wbc 8.2; hgb 14.2; hct 51.1; mcv 103.4; plt 216; glucose 100; bun 46; creat 1.88; k+ 5.8; na++ 142; ca 8.7; liver normal albumin 3.1 01-02-19: wbc 5.3; hgb 145; hct 51.8; mcv 101.2; plt 219; glucose 132; bun 45; creat 1.52; k+ 4.7; na++ 143; ca 9.0 ;liver normal albumin 3.3; tsh 0.435 vit B 12; 296; urine culture: staphylococcus species 01-05-19: glucose 129; bun 37; creat 0.98; k+ 4.1; na++ 134; ca 8.5; mag 1.9 01-10-19: glucose 111; bun 38; creat 0.85; k+ 3.5; na++ 130; ca 8.7  01-28-19: wbc 8.6; hgb 11.0 hct 36.1; mcv 93.0 plt 277; glucose 125; bun 20; creat 1.08; k+ 4.5; na++ 134; ca 8.9 blood and urine cultures: no growth. 02-04-19: wbc 18.0; hgb 11.0; hct 36.3; mcv 92.4; plt 545 02-08-19: wbc 27.4; hgb 10.4; hct 34.0; mcv 93.7;  plt 478; glucose 201; bun 36; creat 2.31; k+ 4.8; na++ 131; ca 8.7; ast 1485.0; alt 846.0; albumin 2.1; amalse 16; lipase 22; direct bili 0.1 d-dimer: 4.15  c-diff: neg  Hepatitis panel: neg 02-09-19: wbc 16.2; hgb 9.7; hct 32.7; mcv 95.1; plt 347; glucose 106; bun 37; creat 2.14; k+ 4.0; na++ 133; ca 8.1; ast 651; alt 801; albumin 2.0; tsh 2.140; INR 2.4 02-12-19: wbc 14.8; hgb 9.9; hct 33.3; mcv 95.4; plt 324; glucose 96; bun 24; creat 0.96; k+ 3.9; na++ 136; ca 8.9; ast 77 alt 272; albumin 2.1 02-14-19: wbc 12.5; hgb 9.8; hct 32.9; mcv 97.1 plt 312; glucose 114; bun 25; creat 0.95; k+ 4.1; na++ 138; ca 9.2; ast 18; alt 134; albumin 2.0 02-15-19: INR 1.6; d-dimer: 3.24 02-22-19: glucose 100; bun 13; creat 1.01; k+ 4.0; na++ 140; ca 8.8   NO NEW LABS.     Review of Systems  Constitutional: Positive for malaise/fatigue.  Respiratory: Positive for shortness of breath. Negative for cough.   Cardiovascular: Negative for chest pain, palpitations and leg swelling.  Gastrointestinal: Negative for abdominal pain, constipation and heartburn.  Musculoskeletal: Negative for back pain, joint pain and myalgias.  Skin: Negative.   Neurological: Negative for dizziness.  Psychiatric/Behavioral: The patient is not nervous/anxious.     Physical Exam Constitutional:      General: He is not in acute distress.    Appearance: He is well-developed. He is not diaphoretic.  Eyes:     Comments: History of cataract extraction  Neck:     Musculoskeletal: Neck supple.     Thyroid: No thyromegaly.  Cardiovascular:     Rate and Rhythm: Tachycardia present. Rhythm irregular.     Pulses: Normal pulses.     Heart sounds: Normal  heart sounds.     Comments: History of SVT ablation; ptca  Pulmonary:     Effort: Pulmonary effort is normal. No respiratory distress.     Breath sounds: Wheezing present.     Comments: 02 dependent  Abdominal:     General: Bowel sounds are normal. There is no distension.      Palpations: Abdomen is soft.     Tenderness: There is no abdominal tenderness.  Genitourinary:    Comments: Foley  Musculoskeletal:     Right lower leg: Edema present.     Left lower leg: Edema present.     Comments: Trace bilateral lower extremity edema Is able to move all extremities    Lymphadenopathy:     Cervical: No cervical adenopathy.  Skin:    General: Skin is warm and dry.     Comments: Left heel: unstaged soft eschar: 7 x 8 x 0.1cm Right heel unstaged: soft eschar: 5 x 4 cm Left buttock: unstaged: soft eschar: 5 x 5.5 x 1.5 cm with 1.5 cm undermining present No signs of infection present    Neurological:     Mental Status: He is alert and oriented to person, place, and time.  Psychiatric:        Mood and Affect: Mood normal.         ASSESSMENT/ PLAN:  TODAY;   1. Acute diastolic CHF (Congestive heart failure) 2. COPD with chronic bronchitis 3. Acute on chronic respiratory failure with hypoxia and hypercapnia 4. Atrial flutter, chronic   Is worse Will increase demadex to 20 mg twice daily  Will increase lopressor to 50 mg twice daily  Will monitor his status.     MD is aware of resident's narcotic use and is in agreement with current plan of care. We will attempt to wean resident as appropriate.  Ok Edwards NP [facility]  Contact [phone] Monday through Friday 8am- 5pm  After hours call [phone]

## 2019-02-24 ENCOUNTER — Non-Acute Institutional Stay (SKILLED_NURSING_FACILITY): Payer: Medicare Other | Admitting: Adult Health

## 2019-02-24 ENCOUNTER — Other Ambulatory Visit: Payer: Self-pay | Admitting: Adult Health

## 2019-02-24 ENCOUNTER — Encounter (HOSPITAL_COMMUNITY)
Admission: RE | Admit: 2019-02-24 | Discharge: 2019-02-24 | Disposition: A | Discharge status: Home / Self Care | Payer: Medicare Other | Source: Skilled Nursing Facility | Attending: Adult Health | Admitting: Adult Health

## 2019-02-24 DIAGNOSIS — I5032 Chronic diastolic (congestive) heart failure: Secondary | ICD-10-CM

## 2019-02-24 DIAGNOSIS — I4892 Unspecified atrial flutter: Secondary | ICD-10-CM

## 2019-02-24 DIAGNOSIS — J9611 Chronic respiratory failure with hypoxia: Secondary | ICD-10-CM | POA: Diagnosis not present

## 2019-02-24 LAB — BASIC METABOLIC PANEL
Anion gap: 11 (ref 5–15)
BUN: 18 mg/dL (ref 8–23)
CO2: 39 mmol/L — ABNORMAL HIGH (ref 22–32)
Calcium: 9.2 mg/dL (ref 8.9–10.3)
Chloride: 86 mmol/L — ABNORMAL LOW (ref 98–111)
Creatinine, Ser: 1.1 mg/dL (ref 0.61–1.24)
GFR calc Af Amer: >60 mL/min (ref >60)
GFR calc non Af Amer: >60 mL/min (ref >60)
Glucose, Bld: 98 mg/dL (ref 70–99)
Potassium: 4.5 mmol/L (ref 3.5–5.1)
Sodium: 136 mmol/L (ref 135–145)

## 2019-02-24 LAB — MAGNESIUM: Magnesium: 1.7 mg/dL (ref 1.7–2.4)

## 2019-02-24 NOTE — Progress Notes (Signed)
Location:  [facility] Room Number: 157-D Place of Service:  SNF (31)   CODE STATUS: Full Code  Allergies  Allergen Reactions  . Codeine Itching  . Trileptal [Oxcarbazepine]     Other reaction(s): Other (See Comments) Abnormal sodium levels Hyponatremia   Chief Complaint  Patient presents with  . Acute Visit    care plan     HPI:  We have come together for his care plan meeting to review his overall status. His son is present via phone. He is not doing well with therapy and is having a poor progression. He is able to stand with moderate assistance; but requires max assist for his adl care. He does have a foley. He is tachycardic. He denies any shortness of breath. He does complain of fatigue. His appetite is variable We have discussed his future care needs. He will require 24 hour care. His son did verbalized understanding. He said that if his dad wants to come home he will not stop him and will support as able.  He and I have discussed his advanced directive: at this time he wants CPR.   Past Medical History:  Diagnosis Date  . AAA (abdominal aortic aneurysm), stable 04/20/2014  . Arthritis   . 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 CAD (coronary artery disease)    a. s/p cath in 2015 showing occluded RCA and high-grade LCx stenosis treated with DESx2 to LCx  . Cancer ([place])   . Depression   . Diverticulitis    hospital [facility]  . Diverticulosis   . GERD (gastroesophageal reflux disease)   . 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 Hernia of unspecified site of abdominal cavity without mention of obstruction or gangrene    hiatal  . Hyperlipidemia LDL goal <70 04/20/2014  . Hypertension   . IBS (irritable bowel syndrome)   . Myocardial infarction ([place]) 01/2014   NSTEMI  . Peripheral vascular disease ([facility])    ankle brachial index of 0.79 on the right and 0.61 on the left.   . Pneumonia   . 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 Reflex sympathetic dystrophy   . 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 SOB (shortness of breath)     Past Surgical History:  Procedure Laterality Date  . CARDIAC CATHETERIZATION    . CATARACT EXTRACTION    . CORONARY ANGIOPLASTY    . HERNIA REPAIR    . LEFT HEART CATHETERIZATION WITH CORONARY ANGIOGRAM N/A 01/17/2014   Procedure: LEFT HEART CATHETERIZATION WITH CORONARY ANGIOGRAM;  Surgeon: Leonie Man, MD;  Location: [facility] CATH LAB;  Service: Cardiovascular;  Laterality: N/A;  . left shoulder      x 3  . NISSEN FUNDOPLICATION    . right elbow surgery     x 2  . right knee arthroscopy    . squamous cell skin cancer     Left Hand  . SUPRAVENTRICULAR TACHYCARDIA ABLATION N/A 01/16/2014   Procedure: SUPRAVENTRICULAR TACHYCARDIA ABLATION;  Surgeon: Evans Lance, MD;  Location: [facility] CATH LAB;  Service: Cardiovascular;  Laterality: N/A;    Social History   Socioeconomic History  . Marital status: Widowed    Spouse name: Not on file  . Number of children: 2  . Years of education: 60  . Highest education level: Not on file  Occupational History  . Occupation: [employer]  . Financial resource strain: Not on file  . Food insecurity    Worry:  Not on file    Inability: Not on file  . Transportation needs    Medical: Not on file    Non-medical: Not on file  Tobacco Use  . Smoking status: Former Smoker    Packs/day: 2.50    Years: 50.00    Pack years: 125.00    Types: Cigarettes    Quit date: 05/05/2005    Years since quitting: 13.8  . Smokeless tobacco: Former Systems developer    Quit date: 05/03/2012  Substance and Sexual Activity  . Alcohol use: No    Alcohol/week: 0.0 standard drinks    Comment: last use 2013  . Drug use: No  . Sexual activity: Not on file    Comment: widowed, Veteran  Lifestyle  . Physical activity    Days per week: Not on file    Minutes per session: Not on file  . Stress: Not on file  Relationships  . Social Herbalist on phone: Not on file    Gets together: Not on  file    Attends religious service: Not on file    Active member of club or organization: Not on file    Attends meetings of clubs or organizations: Not on file    Relationship status: Not on file  . Intimate partner violence    Fear of current or ex partner: Not on file    Emotionally abused: Not on file    Physically abused: Not on file    Forced sexual activity: Not on file  Other Topics Concern  . Not on file  Social History Narrative   Patient drinks 3 cups of caffeine daily.   Patient is right handed.   Family History  Problem Relation Age of Onset  . Colon cancer Mother   . Colon cancer Maternal Aunt   . Colon cancer Maternal Uncle   . Heart attack Neg Hx   . Stroke Neg Hx       VITAL SIGNS BP 125/77   Pulse 86   Temp 98.8 F (37.1 C) (Oral)   Resp 20   Ht 6' (1.829 m)   Wt 207 lb 6.4 oz (94.1 kg)   SpO2 90%   BMI 28.13 kg/m   Outpatient Encounter Medications as of 02/24/2019  Medication Sig  . alprazolam (XANAX) 2 MG tablet Take 1 tablet (2 mg total) by mouth 2 (two) times daily.  . Amino Acids-Protein Hydrolys (FEEDING SUPPLEMENT, PRO-STAT SUGAR FREE 64,) LIQD Take 30 mLs by mouth 2 (two) times daily between meals.   [name] amitriptyline (ELAVIL) 100 MG tablet Take 100 mg by mouth at bedtime.  [name] apixaban (ELIQUIS) 2.5 MG TABS tablet Take 5 mg by mouth 2 (two) times daily.   Roseanne Kaufman [place]-Castor Oil (VENELEX) OINT Apply 1 application topically See admin instructions. Apply to coccyx, sacrum, bilateral buttocks each shift and prn  . bisacodyl (DULCOLAX) 5 MG EC tablet Take 5 mg by mouth daily as needed for mild constipation or moderate constipation.  . budesonide-formoterol (SYMBICORT) 160-4.5 MCG/ACT inhaler Inhale 2 puffs into the lungs 2 (two) times daily.  . camphor-menthol (ANTI-ITCH) lotion Apply 1 application topically 2 (two) times daily as needed for itching. Sparingly  . clotrimazole (LOTRIMIN) 1 % cream Instructions: apply to buttock rash three times daily  Three Times A Day 09:00 AM, 02:00 PM, 09:00 PM  . collagenase (SANTYL) ointment Apply 1 application topically daily. Apply to left buttock wound per treatment orders  . digoxin (LANOXIN) 0.125 MG tablet  Take 0.125 mg by mouth daily.  [name] docusate sodium (COLACE) 100 MG capsule Take 100 mg by mouth daily as needed for mild constipation.  . finasteride (PROSCAR) 5 MG tablet Take 5 mg by mouth every morning.   . gabapentin (NEURONTIN) 800 MG tablet Take 1 tablet (800 mg total) by mouth 3 (three) times daily.  [name] guaiFENesin (MUCINEX) 600 MG 12 hr tablet Take 600 mg by mouth 2 (two) times daily.   [name] ipratropium-albuterol (DUONEB) 0.5-2.5 (3) MG/3ML SOLN Take 3 mLs by nebulization every 4 (four) hours as needed (For wheezing and worsening shortness of breath or respiratory distress).  [name] latanoprost (XALATAN) 0.005 % ophthalmic solution Place 1 drop into both eyes at bedtime.  . metoprolol tartrate (LOPRESSOR) 25 MG tablet Take 25 mg by mouth 2 (two) times daily.   . Multiple Vitamins-Minerals (MULTIVITAMIN WITH MINERALS) tablet Take 1 tablet by mouth daily.  . nitroGLYCERIN (NITROSTAT) 0.4 MG SL tablet Place 1 tablet (0.4 mg total) under the tongue every 5 (five) minutes x 3 doses as needed for chest pain.  . NON FORMULARY Diet: Regular, NAS, Consistent Carbohydrate  . ondansetron (ZOFRAN) 4 MG tablet Take 4 mg by mouth every 6 (six) hours.   . OXYGEN Place 4 L/min into the nose continuous.   [name] spironolactone (ALDACTONE) 25 MG tablet Take 25 mg by mouth daily.  . tamsulosin (FLOMAX) 0.4 MG CAPS capsule Take 0.4 mg by mouth at bedtime.  . torsemide (DEMADEX) 20 MG tablet Take 20 mg by mouth daily.  . [DISCONTINUED] lamoTRIgine (LAMICTAL) 100 MG tablet Take 1 tablet (100 mg total) by mouth 2 (two) times daily.   No facility-administered encounter medications on file as of 02/24/2019.      SIGNIFICANT DIAGNOSTIC EXAMS   PREVIOUS;   01-01-19: chest x-ray:  1. Cardiomegaly with vascular congestion.  2. Small bilateral pleural effusions and bibasilar densities similar to prior radiograph  01-02-19: bilateral lower extremity venous doppler:  1. No evidence of deep venous thrombosis in either lower extremity. 2. Increased pulsatility of the venous waveforms suggests elevated right heart pressures. Findings can be seen in tricuspid regurgitation, right heart failure, pulmonary hypertension and COPD.  01-02-19: 2-d echo:   1. The left ventricle has normal systolic function, with an ejection fraction of 55-60%. The cavity size was normal. There is mildly increased left ventricular wall thickness. Left ventricular diastolic Doppler parameters are indeterminate.  2. The right ventricle has normal systolic function. The cavity was normal. There is no increase in right ventricular wall thickness. Right ventricular systolic pressure could not be assessed.  3. No evidence of mitral valve stenosis.  4. The aortic valve is tricuspid. Mild thickening of the aortic valve. Mild calcification of the aortic valve. No stenosis of the aortic valve.  5. The aorta is normal unless otherwise noted.  6. The ascending aorta is normal in size and structure.  01-03-19: chest x-ray: Stable cardiomegaly, pulmonary vascular congestion, and bilateral pleural-parenchymal scarring. No acute findings.  01-28-19: chest x-ray: Similar appearance of reticulonodular opacities in the mid and lower lungs, which has progressed slowly from 2016 on chest x-ray exams. While the changes most likely represent chronic interstitial fibrosis/scarring, superimposed infection cannot be excluded radiographically   02-04-19: chest x-ray: Extensive bilateral calcified pleural plaques. Chronic increased markings throughout the lungs, favor scarring. No definite acute process   02-08-19: chest x-ray; Unchanged reticulonodular opacities throughout both lungs, likely due to chronic lung changes, however cannot exclude superimposed infectious etiology   02-08-19:  ct of abdomen and pelvis;  1.  No acute intra-abdominal process. 2. New small ascites. 3. Unchanged 3.2 cm infrarenal abdominal aortic aneurysm. Recommend followup by ultrasound in 3 years. T . Aortic aneurysm  4.  Aortic atherosclerosis   02-09-19: right upper quad ultrasound: Gallbladder distention is noted without cholelithiasis. No gallbladder wall thickening or pericholecystic fluid is noted. No other abnormality seen in the right upper quadrant of the abdomen.  02-11-19: ct angio of chest:  1.  Negative examination for pulmonary embolism. 2. There are large, bilateral calcified pleural plaques throughout. Small bilateral pleural effusions, generally loculated and chronic appearing. 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 3. Bulky mediastinal and hilar lymphadenopathy, similar to prior examination dated 04/24/2014, largest right paratracheal lymph nodes measuring 2.4 x 2.4 cm (series 4, image 25), of uncertain significance given long-term stability, perhaps reactive to extensive pleural disease. 4.  Cardiomegaly and coronary artery disease. 5. Enlargement of the main pulmonary artery, as can be seen in pulmonary hypertension. 6.  Ascites in the included upper abdomen. 7.  Aortic Atherosclerosis  02-14-19: MRI of brain 1. Significant and progressive motion degradation. 2. Diffusion imaging is diagnostic and negative for acute infarct. 3. Senescent changes without significant progression since 2015.  TODAY;   02-23-19: chest x-ray: mild CHF bilateral pleural effusion    LABS REVIEWED PREVIOUS;   01-01-19: wbc 8.2; hgb 14.2; hct 51.1; mcv 103.4; plt 216; glucose 100; bun 46; creat 1.88; k+ 5.8; na++ 142; ca 8.7; liver normal albumin 3.1 01-02-19: wbc 5.3; hgb 145; hct 51.8; mcv 101.2; plt 219; glucose 132; bun 45; creat 1.52; k+ 4.7; na++ 143; ca 9.0 ;liver normal albumin 3.3; tsh 0.435 vit B 12; 296; urine culture: staphylococcus species 01-05-19: glucose 129;  bun 37; creat 0.98; k+ 4.1; na++ 134; ca 8.5; mag 1.9 01-10-19: glucose 111; bun 38; creat 0.85; k+ 3.5; na++ 130; ca 8.7  01-28-19: wbc 8.6; hgb 11.0 hct 36.1; mcv 93.0 plt 277; glucose 125; bun 20; creat 1.08; k+ 4.5; na++ 134; ca 8.9 blood and urine cultures: no growth. 02-04-19: wbc 18.0; hgb 11.0; hct 36.3; mcv 92.4; plt 545 02-08-19: wbc 27.4; hgb 10.4; hct 34.0; mcv 93.7; plt 478; glucose 201; bun 36; creat 2.31; k+ 4.8; na++ 131; ca 8.7; ast 1485.0; alt 846.0; albumin 2.1; amalse 16; lipase 22; direct bili 0.1 d-dimer: 4.15  c-diff: neg  Hepatitis panel: neg 02-09-19: wbc 16.2; hgb 9.7; hct 32.7; mcv 95.1; plt 347; glucose 106; bun 37; creat 2.14; k+ 4.0; na++ 133; ca 8.1; ast 651; alt 801; albumin 2.0; tsh 2.140; INR 2.4 02-12-19: wbc 14.8; hgb 9.9; hct 33.3; mcv 95.4; plt 324; glucose 96; bun 24; creat 0.96; k+ 3.9; na++ 136; ca 8.9; ast 77 alt 272; albumin 2.1 02-14-19: wbc 12.5; hgb 9.8; hct 32.9; mcv 97.1 plt 312; glucose 114; bun 25; creat 0.95; k+ 4.1; na++ 138; ca 9.2; ast 18; alt 134; albumin 2.0 02-15-19: INR 1.6; d-dimer: 3.24 02-22-19: glucose 100; bun 13; creat 1.01; k+ 4.0; na++ 140; ca 8.8   NO NEW LABS.     Review of Systems  Constitutional: Positive for malaise/fatigue.  Respiratory: Negative for cough and shortness of breath.   Cardiovascular: Negative for chest pain, palpitations and leg swelling.  Gastrointestinal: Negative for abdominal pain, constipation and heartburn.  Musculoskeletal: Negative for back pain, joint pain and myalgias.  Skin: Negative.   Neurological: Negative for dizziness.  Psychiatric/Behavioral: The patient is not nervous/anxious.    Physical Exam Constitutional:  General: He is not in acute distress.    Appearance: He is well-developed. He is not diaphoretic.  Neck:     Musculoskeletal: Neck supple.     Thyroid: No thyromegaly.  Cardiovascular:     Rate and Rhythm: Tachycardia present. Rhythm irregular.     Pulses: Normal pulses.      Heart sounds: Normal heart sounds.     Comments: History of SVT ablation; ptca  Pulmonary:     Effort: Pulmonary effort is normal. No respiratory distress.     Breath sounds: Normal breath sounds.     Comments: 02 dependent  Abdominal:     General: Bowel sounds are normal. There is no distension.     Palpations: Abdomen is soft.     Tenderness: There is no abdominal tenderness.  Musculoskeletal:     Right lower leg: No edema.     Left lower leg: No edema.     Comments: Able to move all extremities   Lymphadenopathy:     Cervical: No cervical adenopathy.  Skin:    General: Skin is warm and dry.     Comments: Left heel: unstaged soft eschar: 7 x 8 x 0.1cm Right heel unstaged: soft eschar: 5 x 4 cm Left buttock: unstaged: soft eschar: 5 x 5.5 x 1.5 cm with 1.5 cm undermining present No signs of infection present   Neurological:     Mental Status: He is alert and oriented to person, place, and time.  Psychiatric:        Mood and Affect: Mood normal.        ASSESSMENT/ PLAN:  TODAY;   1. Chronic diastolic heart failure 2. Atrial flutter chronic 3. Chronic respiratory failure with hypoxia  Is worse Will check bmp and magnesium Will begin digoxin 125 mcg daily for rate control Will get EKG  Will continue to monitor his status.      MD is aware of resident's narcotic use and is in agreement with current plan of care. We will attempt to wean resident as appropriate.  Ok Edwards NP [facility]  Contact [phone] Monday through Friday 8am- 5pm  After hours call [phone]

## 2019-02-26 ENCOUNTER — Encounter (HOSPITAL_COMMUNITY)
Admission: AD | Admit: 2019-02-26 | Discharge: 2019-02-26 | Disposition: A | Discharge status: Home / Self Care | Payer: Medicare Other | Source: Skilled Nursing Facility

## 2019-02-26 LAB — SARS CORONAVIRUS 2 BY RT PCR (HOSPITAL ORDER, PERFORMED IN ~~LOC~~ HOSPITAL LAB): SARS Coronavirus 2: NEGATIVE

## 2019-02-27 ENCOUNTER — Telehealth: Payer: Self-pay | Admitting: Internal Medicine

## 2019-02-27 NOTE — Telephone Encounter (Signed)
Order given for sending formal PCR covid test due to faint positive rapid test.

## 2019-03-01 ENCOUNTER — Ambulatory Visit (HOSPITAL_COMMUNITY)
Admission: RE | Admit: 2019-03-01 | Discharge: 2019-03-01 | Disposition: A | Discharge status: Home / Self Care | Payer: Medicare Other | Source: Ambulatory Visit | Attending: Internal Medicine | Admitting: Internal Medicine

## 2019-03-01 ENCOUNTER — Encounter: Payer: Self-pay | Admitting: Adult Health

## 2019-03-01 ENCOUNTER — Non-Acute Institutional Stay (SKILLED_NURSING_FACILITY): Payer: Medicare Other | Admitting: Adult Health

## 2019-03-01 DIAGNOSIS — I4892 Unspecified atrial flutter: Secondary | ICD-10-CM | POA: Diagnosis not present

## 2019-03-01 DIAGNOSIS — G609 Hereditary and idiopathic neuropathy, unspecified: Secondary | ICD-10-CM

## 2019-03-01 DIAGNOSIS — I471 Supraventricular tachycardia: Secondary | ICD-10-CM

## 2019-03-01 DIAGNOSIS — E43 Unspecified severe protein-calorie malnutrition: Secondary | ICD-10-CM | POA: Diagnosis not present

## 2019-03-01 DIAGNOSIS — I739 Peripheral vascular disease, unspecified: Secondary | ICD-10-CM | POA: Diagnosis not present

## 2019-03-01 DIAGNOSIS — E785 Hyperlipidemia, unspecified: Secondary | ICD-10-CM

## 2019-03-01 NOTE — Progress Notes (Signed)
Location:    [place] Room Number: 157/D Place of Service:  SNF (31)   CODE STATUS: Full Code  Allergies  Allergen Reactions  . Codeine Itching  . Trileptal [Oxcarbazepine]     Other reaction(s): Other (See Comments) Abnormal sodium levels Hyponatremia   Chief Complaint  Patient presents with  . Medical Management of Chronic Issues          Atrial flutter/SVT (supraventricular tachycardia) Hyperlipidemia LDL goal <70  Peripheral vascular disease/heredity and idiopathic peripheral neuropathy:   Weekly follow up for the first 30 days post hospitalization.      HPI:  He is a 73 year old short term rehab patient being seen for the management of his chronic illnesses: atrial flutter; svt; hyperlipidemia; pvd; peripheral neuropathy. He has been started on digoxin for rate control. This medication has been effective in maintaining his heart rate. He denies any uncontrolled pain. His anxiety is presently being management   Past Medical History:  Diagnosis Date  . AAA (abdominal aortic aneurysm), stable 04/20/2014  . Arthritis   . 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 CAD (coronary artery disease)    a. s/p cath in 2015 showing occluded RCA and high-grade LCx stenosis treated with DESx2 to LCx  . Cancer ([place])   . Depression   . Diverticulitis    hospital 2011 [facility]  . Diverticulosis   . GERD (gastroesophageal reflux disease)   . 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 Hernia of unspecified site of abdominal cavity without mention of obstruction or gangrene    hiatal  . Hyperlipidemia LDL goal <70 04/20/2014  . Hypertension   . IBS (irritable bowel syndrome)   . Myocardial infarction ([place]) 01/2014   NSTEMI  . Peripheral vascular disease (H[facility])    ankle brachial index of 0.79 on the right and 0.61 on the left.   . Pneumonia   . 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 Reflex sympathetic dystrophy   . 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 SOB (shortness of breath)      Past Surgical History:  Procedure Laterality Date  . CARDIAC CATHETERIZATION    . CATARACT EXTRACTION    . CORONARY ANGIOPLASTY    . HERNIA REPAIR    . LEFT HEART CATHETERIZATION WITH CORONARY ANGIOGRAM N/A 01/17/2014   Procedure: LEFT HEART CATHETERIZATION WITH CORONARY ANGIOGRAM;  Surgeon: Leonie Man, MD;  Location: [facility] CATH LAB;  Service: Cardiovascular;  Laterality: N/A;  . left shoulder      x 3  . NISSEN FUNDOPLICATION    . right elbow surgery     x 2  . right knee arthroscopy    . squamous cell skin cancer     Left Hand  . SUPRAVENTRICULAR TACHYCARDIA ABLATION N/A 01/16/2014   Procedure: SUPRAVENTRICULAR TACHYCARDIA ABLATION;  Surgeon: Evans Lance, MD;  Location: [facility] CATH LAB;  Service: Cardiovascular;  Laterality: N/A;    Social History   Socioeconomic History  . Marital status: Widowed    Spouse name: Not on file  . Number of children: 2  . Years of education: 72  . Highest education level: Not on file  Occupational History  . Occupation: [employer]  . Financial resource strain: Not on file  . Food insecurity    Worry: Not on file    Inability: Not on file  . Transportation needs    Medical: Not on file    Non-medical: Not on file  Tobacco Use  .  Smoking status: Former Smoker    Packs/day: 2.50    Years: 50.00    Pack years: 125.00    Types: Cigarettes    Quit date: 05/05/2005    Years since quitting: 13.8  . Smokeless tobacco: Former Systems developer    Quit date: 05/03/2012  Substance and Sexual Activity  . Alcohol use: No    Alcohol/week: 0.0 standard drinks    Comment: last use 2013  . Drug use: No  . Sexual activity: Not on file    Comment: widowed, Veteran  Lifestyle  . Physical activity    Days per week: Not on file    Minutes per session: Not on file  . Stress: Not on file  Relationships  . Social Herbalist on phone: Not on file    Gets together: Not on file    Attends religious service: Not on file    Active member of  club or organization: Not on file    Attends meetings of clubs or organizations: Not on file    Relationship status: Not on file  . Intimate partner violence    Fear of current or ex partner: Not on file    Emotionally abused: Not on file    Physically abused: Not on file    Forced sexual activity: Not on file  Other Topics Concern  . Not on file  Social History Narrative   Patient drinks 3 cups of caffeine daily.   Patient is right handed.   Family History  Problem Relation Age of Onset  . Colon cancer Mother   . Colon cancer Maternal Aunt   . Colon cancer Maternal Uncle   . Heart attack Neg Hx   . Stroke Neg Hx       VITAL SIGNS BP 117/71   Pulse 73   Temp 98.4 F (36.9 C) (Oral)   Resp 18   Ht 6' (1.829 m)   Wt 200 lb 3.2 oz (90.8 kg)   SpO2 97%   BMI 27.15 kg/m   Outpatient Encounter Medications as of 03/01/2019  Medication Sig  . alprazolam (XANAX) 2 MG tablet Take 1 tablet (2 mg total) by mouth 2 (two) times daily.  . Amino Acids-Protein Hydrolys (FEEDING SUPPLEMENT, PRO-STAT SUGAR FREE 64,) LIQD Take 30 mLs by mouth 2 (two) times daily between meals.   [name] amitriptyline (ELAVIL) 100 MG tablet Take 100 mg by mouth at bedtime.  [name] apixaban (ELIQUIS) 2.5 MG TABS tablet Take 5 mg by mouth 2 (two) times daily.   Roseanne Kaufman [place]-Castor Oil (VENELEX) OINT Apply 1 application topically See admin instructions. Apply to coccyx, sacrum, bilateral buttocks each shift and prn  . bisacodyl (DULCOLAX) 5 MG EC tablet Take 5 mg by mouth daily as needed for mild constipation or moderate constipation.  . budesonide-formoterol (SYMBICORT) 160-4.5 MCG/ACT inhaler Inhale 2 puffs into the lungs 2 (two) times daily.  . camphor-menthol (ANTI-ITCH) lotion Apply 1 application topically 2 (two) times daily as needed for itching. Sparingly  . clotrimazole (LOTRIMIN) 1 % cream Instructions: apply to buttock rash three times daily Three Times A Day 09:00 AM, 02:00 PM, 09:00 PM  . collagenase  (SANTYL) ointment Apply 1 application topically daily. Apply to left buttock wound per treatment orders  . digoxin (LANOXIN) 0.125 MG tablet Take 0.125 mg by mouth daily.  [name] docusate sodium (COLACE) 100 MG capsule Take 100 mg by mouth daily as needed for mild constipation.  . finasteride (PROSCAR) 5 MG tablet Take  5 mg by mouth every morning.   . gabapentin (NEURONTIN) 800 MG tablet Take 1 tablet (800 mg total) by mouth 3 (three) times daily.  [name] guaiFENesin (MUCINEX) 600 MG 12 hr tablet Take 600 mg by mouth 2 (two) times daily.   [name] ipratropium-albuterol (DUONEB) 0.5-2.5 (3) MG/3ML SOLN Take 3 mLs by nebulization every 4 (four) hours as needed (For wheezing and worsening shortness of breath or respiratory distress).  [name] latanoprost (XALATAN) 0.005 % ophthalmic solution Place 1 drop into both eyes at bedtime.  . metoprolol tartrate (LOPRESSOR) 25 MG tablet Take 25 mg by mouth 2 (two) times daily.   . Multiple Vitamins-Minerals (MULTIVITAMIN WITH MINERALS) tablet Take 1 tablet by mouth daily.  . nitroGLYCERIN (NITROSTAT) 0.4 MG SL tablet Place 1 tablet (0.4 mg total) under the tongue every 5 (five) minutes x 3 doses as needed for chest pain.  . NON FORMULARY Diet: Regular, NAS, Consistent Carbohydrate  . ondansetron (ZOFRAN) 4 MG tablet Take 4 mg by mouth every 6 (six) hours as needed for nausea or vomiting.   . OXYGEN Place 4 L/min into the nose continuous.   [name] spironolactone (ALDACTONE) 25 MG tablet Take 25 mg by mouth daily.  . tamsulosin (FLOMAX) 0.4 MG CAPS capsule Take 0.4 mg by mouth at bedtime.  . torsemide (DEMADEX) 20 MG tablet Take 20 mg by mouth 2 (two) times daily between meals.   . [DISCONTINUED] lamoTRIgine (LAMICTAL) 100 MG tablet Take 1 tablet (100 mg total) by mouth 2 (two) times daily.   No facility-administered encounter medications on file as of 03/01/2019.      SIGNIFICANT DIAGNOSTIC EXAMS   PREVIOUS;   01-01-19: chest x-ray:  1. Cardiomegaly with vascular congestion.  2. Small bilateral pleural effusions and bibasilar densities similar to prior radiograph  01-02-19: bilateral lower extremity venous doppler:  1. No evidence of deep venous thrombosis in either lower extremity. 2. Increased pulsatility of the venous waveforms suggests elevated right heart pressures. Findings can be seen in tricuspid regurgitation, right heart failure, pulmonary hypertension and COPD.  01-02-19: 2-d echo:   1. The left ventricle has normal systolic function, with an ejection fraction of 55-60%. The cavity size was normal. There is mildly increased left ventricular wall thickness. Left ventricular diastolic Doppler parameters are indeterminate.  2. The right ventricle has normal systolic function. The cavity was normal. There is no increase in right ventricular wall thickness. Right ventricular systolic pressure could not be assessed.  3. No evidence of mitral valve stenosis.  4. The aortic valve is tricuspid. Mild thickening of the aortic valve. Mild calcification of the aortic valve. No stenosis of the aortic valve.  5. The aorta is normal unless otherwise noted.  6. The ascending aorta is normal in size and structure.  01-03-19: chest x-ray: Stable cardiomegaly, pulmonary vascular congestion, and bilateral pleural-parenchymal scarring. No acute findings.  01-28-19: chest x-ray: Similar appearance of reticulonodular opacities in the mid and lower lungs, which has progressed slowly from 2016 on chest x-ray exams. While the changes most likely represent chronic interstitial fibrosis/scarring, superimposed infection cannot be excluded radiographically   02-04-19: chest x-ray: Extensive bilateral calcified pleural plaques. Chronic increased markings throughout the lungs, favor scarring. No definite acute process   02-08-19: chest x-ray; Unchanged reticulonodular opacities throughout both lungs, likely due to chronic lung changes, however cannot exclude superimposed infectious etiology   02-08-19: ct of abdomen and pelvis;  1.  No acute intra-abdominal process. 2. New small ascites. 3. Unchanged 3.2 cm infrarenal  abdominal aortic aneurysm. Recommend followup by ultrasound in 3 years. T . Aortic aneurysm  4.  Aortic atherosclerosis   02-09-19: right upper quad ultrasound: Gallbladder distention is noted without cholelithiasis. No gallbladder wall thickening or pericholecystic fluid is noted. No other abnormality seen in the right upper quadrant of the abdomen.  02-11-19: ct angio of chest:  1.  Negative examination for pulmonary embolism. 2. There are large, bilateral calcified pleural plaques throughout. Small bilateral pleural effusions, generally loculated and chronic appearing. 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 3. Bulky mediastinal and hilar lymphadenopathy, similar to prior examination dated 04/24/2014, largest right paratracheal lymph nodes measuring 2.4 x 2.4 cm (series 4, image 25), of uncertain significance given long-term stability, perhaps reactive to extensive pleural disease. 4.  Cardiomegaly and coronary artery disease. 5. Enlargement of the main pulmonary artery, as can be seen in pulmonary hypertension. 6.  Ascites in the included upper abdomen. 7.  Aortic Atherosclerosis  02-14-19: MRI of brain 1. Significant and progressive motion degradation. 2. Diffusion imaging is diagnostic and negative for acute infarct. 3. Senescent changes without significant progression since 2015.  02-23-19: chest x-ray: mild CHF bilateral pleural effusion  NO NEW EXAMS.     LABS REVIEWED PREVIOUS;   01-01-19: wbc 8.2; hgb 14.2; hct 51.1; mcv 103.4; plt 216; glucose 100; bun 46; creat 1.88; k+ 5.8; na++ 142; ca 8.7; liver normal albumin 3.1 01-02-19: wbc 5.3; hgb 145; hct 51.8; mcv 101.2; plt 219; glucose 132; bun 45; creat 1.52; k+ 4.7; na++ 143; ca 9.0 ;liver normal albumin 3.3; tsh 0.435 vit B 12; 296; urine culture: staphylococcus species 01-05-19: glucose  129; bun 37; creat 0.98; k+ 4.1; na++ 134; ca 8.5; mag 1.9 01-10-19: glucose 111; bun 38; creat 0.85; k+ 3.5; na++ 130; ca 8.7  01-28-19: wbc 8.6; hgb 11.0 hct 36.1; mcv 93.0 plt 277; glucose 125; bun 20; creat 1.08; k+ 4.5; na++ 134; ca 8.9 blood and urine cultures: no growth. 02-04-19: wbc 18.0; hgb 11.0; hct 36.3; mcv 92.4; plt 545 02-08-19: wbc 27.4; hgb 10.4; hct 34.0; mcv 93.7; plt 478; glucose 201; bun 36; creat 2.31; k+ 4.8; na++ 131; ca 8.7; ast 1485.0; alt 846.0; albumin 2.1; amalse 16; lipase 22; direct bili 0.1 d-dimer: 4.15  c-diff: neg  Hepatitis panel: neg 02-09-19: wbc 16.2; hgb 9.7; hct 32.7; mcv 95.1; plt 347; glucose 106; bun 37; creat 2.14; k+ 4.0; na++ 133; ca 8.1; ast 651; alt 801; albumin 2.0; tsh 2.140; INR 2.4 02-12-19: wbc 14.8; hgb 9.9; hct 33.3; mcv 95.4; plt 324; glucose 96; bun 24; creat 0.96; k+ 3.9; na++ 136; ca 8.9; ast 77 alt 272; albumin 2.1 02-14-19: wbc 12.5; hgb 9.8; hct 32.9; mcv 97.1 plt 312; glucose 114; bun 25; creat 0.95; k+ 4.1; na++ 138; ca 9.2; ast 18; alt 134; albumin 2.0 02-15-19: INR 1.6; d-dimer: 3.24 02-22-19: glucose 100; bun 13; creat 1.01; k+ 4.0; na++ 140; ca 8.8   NO NEW LABS.    Review of Systems  Constitutional: Positive for malaise/fatigue.  Respiratory: Negative for cough and shortness of breath.   Cardiovascular: Negative for chest pain, palpitations and leg swelling.  Gastrointestinal: Negative for abdominal pain, constipation and heartburn.  Musculoskeletal: Negative for back pain, joint pain and myalgias.  Skin: Negative.   Neurological: Negative for dizziness.  Psychiatric/Behavioral: The patient is not nervous/anxious.      Physical Exam Constitutional:      General: He is not in acute distress.    Appearance: He is  well-developed. He is not diaphoretic.  Neck:     Musculoskeletal: Neck supple.     Thyroid: No thyromegaly.  Cardiovascular:     Rate and Rhythm: Normal rate. Rhythm irregular.     Pulses: Normal pulses.      Heart sounds: Normal heart sounds.     Comments: History of SVT ablation; ptca Pulmonary:     Effort: Pulmonary effort is normal. No respiratory distress.     Breath sounds: Normal breath sounds.     Comments: 02 dependent  Abdominal:     General: Bowel sounds are normal. There is no distension.     Palpations: Abdomen is soft.     Tenderness: There is no abdominal tenderness.  Genitourinary:    Comments: foley Musculoskeletal: Normal range of motion.     Right lower leg: No edema.     Left lower leg: No edema.  Lymphadenopathy:     Cervical: No cervical adenopathy.  Skin:    General: Skin is warm and dry.     Comments:  Left heel: unstaged soft eschar: 7 x 8 x 0.1cm Right heel unstaged: soft eschar: 5 x 4 cm Left buttock: unstaged: soft eschar: 5 x 5.5 x 1.5 cm with 1.5 cm undermining present No signs of infection present    Neurological:     Mental Status: He is alert and oriented to person, place, and time.  Psychiatric:        Mood and Affect: Mood normal.      ASSESSMENT/ PLAN:  TODAY ;  1. Atrial flutter/SVT (supraventricular tachycardia) s/p ablation heart rate stable will continue lopressor 25 mg twice daily and digoxin 0.125 mg daily for rate control eliquis 5 mg twice daily   2. Hyperlipidemia LDL goal <70 is off lipitor due to recent hepatitis   3. Peripheral vascular disease/heredity and idiopathic peripheral neuropathy: is stable will continue elevil 100 mg nightly and neurontin 80 mg three times daily   PREVIOUS  4.  Urinary retention is stable has foley has failed 2 voiding trial; awaiting urology consult will continue flomax 0.4 mg daily and proscar 5 mg daily   5. Protein calorie malnutrition severe: albumin 2.0 will continue prostat twice daily and supplements as directed will monitor  6. Primary open glaucoma of both eyes unspecified stage: will continue xalatan to both eyes  7. GERD without esophagitis: is stable at this time will continue zofram 4  mg every 6 hours as needed  for nausea  8. Unstagable pressure ulcer of left buttock/decubitus ulcer bilateral heels unstagable: will continue current treatments and supplements.   9. Elevated liver enzymes; acute hepatitis; elevated d-dimer: will check INR and d-dimer; will monitor his status.   10. Acute diastolic CHF (congestive heart failure) EF 55-60% (12-06-18) will begin demadex 20 mg twice daily with k+ 10 meq daily lopressor 25 mg twice daily has ntg as needed for chest pain.   11. Hypokalemia: is stable k+ 4.0 will continue k+ 10 meq daily   12. Generalized anxiety disorder: is stable will continue xanax 2 mg twice daily   13. Acute on chronic respiratory failure with hypoxia and hypercapnia/copd with chronic bronchitis asbestosis is stable 02 dependent will continue mucinex 600 mg twice daily duoneb every 4 hours as needed symbicort 160/4.5 mcg 2 puffs twice daily      MD is aware of resident's narcotic use and is in agreement with current plan of care. We will attempt to wean resident as appropriate.  Ok Edwards NP  [place] [phone] Monday through Friday 8am- 5pm  After hours call [phone]

## 2019-03-02 ENCOUNTER — Encounter: Payer: Self-pay | Admitting: Internal Medicine

## 2019-03-02 ENCOUNTER — Ambulatory Visit (INDEPENDENT_AMBULATORY_CARE_PROVIDER_SITE_OTHER): Payer: Medicare Other | Admitting: Urology

## 2019-03-02 ENCOUNTER — Non-Acute Institutional Stay (SKILLED_NURSING_FACILITY): Payer: Medicare Other | Admitting: Internal Medicine

## 2019-03-02 DIAGNOSIS — L89154 Pressure ulcer of sacral region, stage 4: Secondary | ICD-10-CM

## 2019-03-02 DIAGNOSIS — R338 Other retention of urine: Secondary | ICD-10-CM

## 2019-03-02 DIAGNOSIS — R634 Abnormal weight loss: Secondary | ICD-10-CM

## 2019-03-02 DIAGNOSIS — G9340 Encephalopathy, unspecified: Secondary | ICD-10-CM | POA: Diagnosis not present

## 2019-03-02 DIAGNOSIS — Z09 Encounter for follow-up examination after completed treatment for conditions other than malignant neoplasm: Secondary | ICD-10-CM | POA: Diagnosis not present

## 2019-03-02 DIAGNOSIS — I5032 Chronic diastolic (congestive) heart failure: Secondary | ICD-10-CM | POA: Insufficient documentation

## 2019-03-02 NOTE — Progress Notes (Signed)
Location:  [facility] Room Number: 157/D Place of Service:  SNF (31)  Curtis Sacramento, MD  Patient Care Team: Curtis Sacramento, MD as PCP - General (Family Medicine) Herminio Commons, MD as PCP - Cardiology (Cardiology)  Extended Emergency Contact Information Primary Emergency Contact: [facility] Address: [place], [place] [address] Johnnette Litter of [place] Phone: [phone] Mobile Phone: [phone] Relation: Other Secondary Emergency Contact: Braziel,Mark Address: [place], [place] of [place] Phone: [phone] Mobile Phone: [phone] Relation: Son    Allergies: Codeine and Trileptal [oxcarbazepine]  Chief Complaint  Patient presents with   Follow-up    Acute Follow     HPI: Patient is 73 y.o. male who is being seen in follow-up for his hospitalization for acute hepatitis, AKI, encephalopathy and stage IV decubitus ulcer which was debrided.  Since readmission patient has eaten 0 to 25% of his meals and his weight has dropped from 207 pounds -200.9.  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 s/p cath in 2015 showing occluded RCA and high-grade LCx stenosis treated with DESx2 to LCx   Cancer [facility])    Depression    Diverticulitis    hospital 2011 [facility]   Diverticulosis    GERD (gastroesophageal reflux disease)    Glaucoma 2016   bilateral   Hernia of unspecified site of abdominal cavity without mention of obstruction or gangrene    hiatal   Hyperlipidemia LDL goal <70 04/20/2014   Hypertension    IBS (irritable bowel syndrome)    Myocardial infarction ([place]) 01/2014   NSTEMI   Peripheral vascular disease (HCC)    ankle brachial index of 0.79 on the right and 0.61 on the left.    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    SOB (shortness of breath)     Past Surgical History:  Procedure Laterality Date   CARDIAC CATHETERIZATION     CATARACT EXTRACTION     CORONARY ANGIOPLASTY     HERNIA REPAIR     LEFT HEART CATHETERIZATION WITH CORONARY ANGIOGRAM N/A 01/17/2014   Procedure: LEFT HEART CATHETERIZATION WITH CORONARY ANGIOGRAM;  Surgeon: Leonie Man, MD;  Location: [facility] CATH LAB;  Service: Cardiovascular;  Laterality: N/A;   left shoulder      x 3   NISSEN FUNDOPLICATION     right elbow surgery     x 2   right knee arthroscopy     squamous cell skin cancer     Left Hand   SUPRAVENTRICULAR TACHYCARDIA ABLATION N/A 01/16/2014   Procedure: SUPRAVENTRICULAR TACHYCARDIA ABLATION;  Surgeon: Evans Lance, MD;  Location: [facility] CATH LAB;  Service: Cardiovascular;  Laterality: N/A;    Outpatient Encounter Medications as of 03/02/2019  Medication Sig   alprazolam (XANAX) 2 MG tablet Take 1 tablet (2 mg total) by mouth 2 (two) times daily.   Amino Acids-Protein Hydrolys (FEEDING SUPPLEMENT, PRO-STAT SUGAR FREE 64,) LIQD Take 30 mLs by mouth 2 (two) times daily between meals.    amitriptyline (ELAVIL) 100 MG tablet Take 100 mg by mouth at bedtime.   apixaban (ELIQUIS) 2.5 MG  TABS tablet Take 5 mg by mouth 2 (two) times daily.    Balsam [place]-Castor Oil (VENELEX) OINT Apply 1 application topically See admin instructions. Apply to coccyx, sacrum, bilateral buttocks each shift and prn   bisacodyl (DULCOLAX) 5 MG EC tablet Take 5 mg by mouth daily as needed for mild constipation or moderate constipation.   budesonide-formoterol (SYMBICORT) 160-4.5 MCG/ACT inhaler Inhale 2 puffs into the lungs 2 (two) times daily.   camphor-menthol (ANTI-ITCH) lotion Apply 1 application topically 2 (two) times daily as needed for itching. Sparingly   clotrimazole (LOTRIMIN) 1 % cream Instructions: apply to buttock rash three times  daily Three Times A Day 09:00 AM, 02:00 PM, 09:00 PM   collagenase (SANTYL) ointment Apply 1 application topically daily. Apply to left buttock wound per treatment orders   digoxin (LANOXIN) 0.125 MG tablet Take 0.125 mg by mouth daily.   docusate sodium (COLACE) 100 MG capsule Take 100 mg by mouth daily as needed for mild constipation.   finasteride (PROSCAR) 5 MG tablet Take 5 mg by mouth every morning.    gabapentin (NEURONTIN) 800 MG tablet Take 1 tablet (800 mg total) by mouth 3 (three) times daily.   guaiFENesin (MUCINEX) 600 MG 12 hr tablet Take 600 mg by mouth 2 (two) times daily.    ipratropium-albuterol (DUONEB) 0.5-2.5 (3) MG/3ML SOLN Take 3 mLs by nebulization every 4 (four) hours as needed (For wheezing and worsening shortness of breath or respiratory distress).   latanoprost (XALATAN) 0.005 % ophthalmic solution Place 1 drop into both eyes at bedtime.   metoprolol tartrate (LOPRESSOR) 25 MG tablet Take 25 mg by mouth 2 (two) times daily.    Multiple Vitamins-Minerals (MULTIVITAMIN WITH MINERALS) tablet Take 1 tablet by mouth daily.   nitroGLYCERIN (NITROSTAT) 0.4 MG SL tablet Place 1 tablet (0.4 mg total) under the tongue every 5 (five) minutes x 3 doses as needed for chest pain.   NON FORMULARY Diet: Regular, NAS, Consistent Carbohydrate   ondansetron (ZOFRAN) 4 MG tablet Take 4 mg by mouth every 6 (six) hours as needed for nausea or vomiting.    OXYGEN Place 4 L/min into the nose continuous.    spironolactone (ALDACTONE) 25 MG tablet Take 25 mg by mouth daily.   tamsulosin (FLOMAX) 0.4 MG CAPS capsule Take 0.4 mg by mouth at bedtime.   torsemide (DEMADEX) 20 MG tablet Take 20 mg by mouth 2 (two) times daily between meals.    [DISCONTINUED] lamoTRIgine (LAMICTAL) 100 MG tablet Take 1 tablet (100 mg total) by mouth 2 (two) times daily.   No facility-administered encounter medications on file as of 03/02/2019.     No orders of the defined types were placed in  this encounter.   Immunization History  Administered Date(s) Administered   Tdap 01/25/2019    Social History   Tobacco Use   Smoking status: Former Smoker    Packs/day: 2.50    Years: 50.00    Pack years: 125.00    Types: Cigarettes    Quit date: 05/05/2005    Years since quitting: 13.8   Smokeless tobacco: Former Systems developer    Quit date: 05/03/2012  Substance Use Topics   Alcohol use: No    Alcohol/week: 0.0 standard drinks    Comment: last use 2013    Review of Systems  GENERAL:  no fevers, fatigue, poor appetite SKIN: No itching, rash HEENT: No complaint RESPIRATORY: No cough, wheezing, SOB CARDIAC: No chest pain, palpitations, lower extremity edema  GI: No abdominal pain,  No N/V/D or constipation, No heartburn or reflux  GU: No dysuria, frequency or urgency, or incontinence  MUSCULOSKELETAL: No unrelieved bone/joint pain NEUROLOGIC: No headache, dizziness  PSYCHIATRIC: No overt anxiety or sadness  Vitals:   03/02/19 1142  BP: 120/71  Pulse: 91  Resp: 20  Temp: 97.6 F (36.4 C)  SpO2: 91%   Body mass index is 27.25 kg/m. Physical Exam  GENERAL APPEARANCE: Alert, conversant, No acute distress  SKIN: No diaphoresis rash HEENT: Unremarkable RESPIRATORY: Breathing is even, unlabored. Lung sounds are clear   CARDIOVASCULAR: Heart RRR no murmurs, rubs or gallops. No peripheral edema  GASTROINTESTINAL: Abdomen is soft, non-tender, not distended w/ normal bowel sounds.  GENITOURINARY: Bladder non tender, not distended  MUSCULOSKELETAL: No abnormal joints or musculature NEUROLOGIC: Cranial nerves 2-12 grossly intact. Moves all extremities PSYCHIATRIC: Patient is perseverating and often has moments of confusion and at times it back to his baseline, no behavioral issues  Patient Active Problem List   Diagnosis Date Noted   Unstageable pressure ulcer of left buttock ([place]) 02/21/2019   Decubitus ulcer of heel, bilateral, unstageable ([place]) 02/21/2019   Acute  encephalopathy 02/20/2019   Anasarca 02/20/2019   BPH with urinary obstruction 02/20/2019   Sacral decubitus ulcer, stage II ([place])    D-dimer, elevated 02/08/2019   Elevated liver enzymes 02/08/2019   Nausea vomiting and diarrhea 02/08/2019   Acute hepatitis 02/08/2019   Physical deconditioning 02/04/2019   Pressure injury of deep tissue of left heel 01/27/2019   Atrial flutter, chronic ([place]) 01/15/2019   Hypokalemia 01/15/2019   Bilateral open angle glaucoma 01/15/2019   Anxiety disorder [id number]   Acute diastolic CHF (congestive heart failure) ([place]) 01/03/2019   Pressure ulcer of right heel, stage 2 ([place]) 01/03/2019   COPD exacerbation ([place]) 01/02/2019   Acute on chronic respiratory failure with hypoxia and hypercapnia ([place]) 01/02/2019   Acute CHF (congestive heart failure) ([place]) 01/02/2019   Hyperkalemia 01/02/2019   Sepsis ([place]) 12/15/2018   Situational mixed anxiety and depressive disorder 02/28/2018   Carotid artery disease ([place]) 02/27/2016   Acute hyponatremia 02/12/2015   Hereditary and idiopathic peripheral neuropathy 01/30/2015   Acute on chronic respiratory failure ([place]) 05/08/2014   Anemia, iron deficiency 05/08/2014   Hyponatremia 05/08/2014   Urinary retention 05/08/2014   Chronic respiratory failure with hypoxia (HCC) 04/30/2014   Shortness of breath    HCAP (healthcare-associated pneumonia) 04/24/2014   Hyperlipidemia LDL goal <70 04/20/2014   AAA (abdominal aortic aneurysm), stable 04/20/2014   Abdominal pain, generalized    SOB (shortness of breath)    Peripheral vascular disease (HCC)    Presence of drug coated stent in left circumflex coronary artery: Promus DES 3.5 mm x 38 mm (3.9 mm) 01/17/2014    Class: Acute   CAD (coronary artery disease), native coronary artery    SVT (supraventricular tachycardia) s/p ablation 01/16/2014 01/16/2014   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   TIA (transient  ischemic attack) 09/06/2013   HTN (hypertension) 09/05/2013   AKI (acute kidney injury) ([place]) 11/01/2012   TOBACCO ABUSE, hx 08/23/2007   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   Asbestosis ([place]) 08/12/2007   Reflex sympathetic dystrophy 05/29/2007   GERD 05/29/2007    CMP     Component Value Date/Time   NA 136 02/24/2019 1300   K 4.5 02/24/2019 1300   CL 86 (L) 02/24/2019 1300   CO2 39 (H) 02/24/2019 1300   GLUCOSE 98 02/24/2019 1300   BUN 18 02/24/2019 1300  CREATININE 1.10 02/24/2019 1300   CALCIUM 9.2 02/24/2019 1300   PROT 5.9 (L) 02/14/2019 0509   PROT 7.6 01/30/2015 1455   ALBUMIN 2.0 (L) 02/14/2019 0509   AST 18 02/14/2019 0509   ALT 134 (H) 02/14/2019 0509   ALKPHOS 66 02/14/2019 0509   BILITOT 0.3 02/14/2019 0509   GFRNONAA >60 02/24/2019 1300   GFRAA >60 02/24/2019 1300   Recent Labs    01/04/19 0525 01/05/19 0649  02/14/19 0509 02/22/19 0600 02/24/19 1300  NA 137 134*   < > 138 140 136  K 4.1 4.1   < > 4.1 4.0 4.5  CL 90* 93*   < > 104 90* 86*  CO2 36* 36*   < > 26 40* 39*  GLUCOSE 150* 129*   < > 114* 100* 98  BUN 45* 37*   < > 25* 13 18  CREATININE 1.11 0.98   < > 0.95 1.01 1.10  CALCIUM 8.5* 8.5*   < > 9.2 8.8* 9.2  MG 1.9 1.9  --   --   --  1.7   < > = values in this interval not displayed.   Recent Labs    02/11/19 0422 02/12/19 0622 02/14/19 0509  AST 112* 77* 18  ALT 352* 272* 134*  ALKPHOS 72 79 66  BILITOT 0.5 0.6 0.3  PROT 5.8* 6.2* 5.9*  ALBUMIN 1.9* 2.1* 2.0*   Recent Labs    01/28/19 0010 02/04/19 1300  02/09/19 0930  02/12/19 0622 02/13/19 0653 02/14/19 0509  WBC 8.6 18.0*   < > 16.2*   < > 14.8* 15.5* 12.5*  NEUTROABS 6.7 14.1*  --  12.6*  --   --   --   --   HGB 11.0* 11.0*   < > 9.7*   < > 9.9* 10.3* 9.8*  HCT 36.1* 36.3*   < > 32.7*   < > 33.3* 34.6* 32.9*  MCV 93.0 92.4   < > 95.1   < > 95.4 95.8 97.1  PLT 277 545*   < > 347   < > 324 328 312   < > = values in this interval not displayed.   No  results for input(s): CHOL, LDLCALC, TRIG in the last 8760 hours.  Invalid input(s): HCL No results found for: [facility] Lab Results  Component Value Date   TSH 2.140 02/09/2019   Lab Results  Component Value Date   HGBA1C 6.2 (H) 09/07/2013   Lab Results  Component Value Date   CHOL 102 04/18/2014   HDL 24 (L) 04/18/2014   LDLCALC 34 04/18/2014   LDLDIRECT 43.7 04/03/2014   TRIG 222 (H) 04/18/2014   CHOLHDL 4.3 04/18/2014    Significant Diagnostic Results in last 30 days:  Ct Abdomen Pelvis Wo Contrast  Result Date: 02/08/2019 CLINICAL DATA:  Nausea, vomiting, and diarrhea. Abnormal LFTs. Acute renal failure. EXAM: CT ABDOMEN AND PELVIS WITHOUT CONTRAST TECHNIQUE: Multidetector CT imaging of the abdomen and pelvis was performed following the standard protocol without IV contrast. COMPARISON:  CT abdomen pelvis dated November 10, 2014. FINDINGS: Lower chest: No acute abnormality. Cardiomegaly. Chronic atelectasis and scarring in the lung basis with multiple calcified pleural plaques. Hepatobiliary: No focal liver abnormality is seen. No gallstones, gallbladder wall thickening, or biliary dilatation. Pancreas: Unremarkable. No pancreatic ductal dilatation or surrounding inflammatory changes. Spleen: Normal in size without focal abnormality. Adrenals/Urinary Tract: The adrenal glands are unremarkable. 1.9 cm right renal simple cyst, mildly enlarged since 2016. No renal calculi  or hydronephrosis. The bladder is decompressed by Foley catheter. Stomach/Bowel: Prior Nissen fundoplication. Stomach is otherwise within normal limits. No bowel wall thickening, distention, or surrounding inflammatory changes. Mild left-sided colonic diverticulosis. Normal appendix. Vascular/Lymphatic: Extensive aortoiliac atherosclerosis with unchanged 3.2 cm infrarenal abdominal aortic aneurysm. No enlarged abdominal or pelvic lymph nodes. Reproductive: Prostate is unremarkable. Other: New small ascites.  No  pneumoperitoneum. Musculoskeletal: No acute or significant osseous findings. IMPRESSION: 1.  No acute intra-abdominal process. 2. New small ascites. 3. Unchanged 3.2 cm infrarenal abdominal aortic aneurysm. Recommend followup by ultrasound in 3 years. This recommendation follows ACR consensus guidelines: White Paper of the ACR Incidental Findings Committee II on Vascular Findings. J Am Coll Radiol 2013; 10:789-794. Aortic aneurysm NOS (ICD10-I71.9) 4.  Aortic atherosclerosis (ICD10-I70.0). Electronically Signed   By: Titus Dubin M.D.   On: 02/08/2019 22:48   Dg Chest 2 View  Result Date: 02/15/2019 CLINICAL DATA:  Increasing edema and shortness of breath. History of asbestosis. EXAM: CHEST - 2 VIEW COMPARISON:  CT chest 02/11/2019. PA and lateral chest 02/08/2019 and 02/04/2019. FINDINGS: There is marked cardiomegaly and interstitial pulmonary edema. Bilateral pleural effusions are present. Extensive calcified pleural plaques are seen. Aeration appears worse than on the most recent comparison plain films. IMPRESSION: Worsened aeration since the most recent plain films likely due to pulmonary edema in this patient with cardiomegaly and small effusions. Calcified pleural plaques. Electronically Signed   By: Inge Rise M.D.   On: 02/15/2019 16:01   Dg Chest 2 View  Result Date: 02/04/2019 CLINICAL DATA:  COPD EXAM: CHEST - 2 VIEW COMPARISON:  01/28/2019 FINDINGS: Cardiomegaly. Calcified pleural plaques bilaterally. There is hyperinflation of the lungs compatible with COPD. Reticulonodular opacities throughout the lungs, favor scarring, stable. No visible effusions or acute bony abnormality. IMPRESSION: Extensive bilateral calcified pleural plaques. Chronic increased markings throughout the lungs, favor scarring. No definite acute process. Electronically Signed   By: Rolm Baptise M.D.   On: 02/04/2019 14:51   Dg Abd 1 View  Result Date: 02/15/2019 CLINICAL DATA:  Increasing edema and shortness of  breath. EXAM: ABDOMEN - 1 VIEW COMPARISON:  None. FINDINGS: The bowel gas pattern is normal. No radio-opaque calculi or other significant radiographic abnormality are seen. Calcified pleural plaques noted. IMPRESSION: Negative exam. Electronically Signed   By: Inge Rise M.D.   On: 02/15/2019 16:02   Ct Angio Chest Pe W Or Wo Contrast  Result Date: 02/11/2019 CLINICAL DATA:  Shortness of breath EXAM: CT ANGIOGRAPHY CHEST WITH CONTRAST TECHNIQUE: Multidetector CT imaging of the chest was performed using the standard protocol during bolus administration of intravenous contrast. Multiplanar CT image reconstructions and MIPs were obtained to evaluate the vascular anatomy. CONTRAST:  136mL OMNIPAQUE IOHEXOL 350 MG/ML SOLN COMPARISON:  Abdomen CT abdomen pelvis, 02/08/2019, CT chest, 04/24/2014 FINDINGS: Cardiovascular: Satisfactory opacification of the pulmonary arteries to the segmental level. No evidence of pulmonary embolism. Cardiomegaly with 3 vessel coronary artery calcifications and/or stents. Trace pericardial effusion. Enlargement of the main pulmonary artery up to 4.0 cm. Aortic atherosclerosis. Mediastinum/Nodes: Bulky mediastinal and hilar lymphadenopathy, similar to prior examination dated 04/24/2014, largest right paratracheal lymph nodes measuring 2.4 x 2.4 cm (series 4, image 25). Thyroid gland, trachea, and esophagus demonstrate no significant findings. Lungs/Pleura: There are large, bilateral calcified pleural plaques throughout. Small bilateral pleural effusions, generally loculated and chronic appearing. Upper Abdomen: Small volume ascites in the included upper abdomen. Musculoskeletal: No chest wall abnormality. No acute or significant osseous findings. Review of the MIP images  confirms the above findings. IMPRESSION: 1.  Negative examination for pulmonary embolism. 2. There are large, bilateral calcified pleural plaques throughout. Small bilateral pleural effusions, generally loculated and  chronic appearing. 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 3. Bulky mediastinal and hilar lymphadenopathy, similar to prior examination dated 04/24/2014, largest right paratracheal lymph nodes measuring 2.4 x 2.4 cm (series 4, image 25), of uncertain significance given long-term stability, perhaps reactive to extensive pleural disease. 4.  Cardiomegaly and coronary artery disease. 5. Enlargement of the main pulmonary artery, as can be seen in pulmonary hypertension. 6.  Ascites in the included upper abdomen. 7.  Aortic Atherosclerosis (ICD10-I70.0). Electronically Signed   By: Eddie Candle M.D.   On: 02/11/2019 12:32   Mr Brain Wo Contrast  Result Date: 02/14/2019 CLINICAL DATA:  Altered level of consciousness EXAM: MRI HEAD WITHOUT CONTRAST TECHNIQUE: Multiplanar, multiecho pulse sequences of the brain and surrounding structures were obtained without intravenous contrast. COMPARISON:  09/07/2013 FINDINGS: Brain: No acute infarction, hemorrhage, hydrocephalus, or masslike finding. Mild asymmetric accumulation of CSF along the left cerebellum, stable. Mild to moderate chronic small vessel ischemic type change in the deep cerebral white matter, not significantly progressed although there has been an interval lacunar infarct at the left caudate. Brain atrophy that is generalized and shows only mild progression from 2015. Vascular: Preserved flow voids. Skull and upper cervical spine: No evidence of marrow lesion Sinuses/Orbits: Negative Other: Significant and progressive motion degradation. Diffusion imaging is diagnostic IMPRESSION: 1. Significant and progressive motion degradation. 2. Diffusion imaging is diagnostic and negative for acute infarct. 3. Senescent changes without significant progression since 2015. Electronically Signed   By: Monte Fantasia M.D.   On: 02/14/2019 11:13   US Arterial Abi (screening Lower Extremity)  Result Date: 03/01/2019 CLINICAL DATA:  73 year old male with a  history cardiovascular risk factors EXAM: NONINVASIVE PHYSIOLOGIC VASCULAR STUDY OF BILATERAL LOWER EXTREMITIES TECHNIQUE: Evaluation of both lower extremities was performed at rest, including calculation of ankle-brachial indices, multiple segmental pressure evaluation, segmental Doppler and segmental pulse volume recording. COMPARISON:  None. FINDINGS: Right ABI:  0.61 Left ABI:  0.62 Right Lower Extremity: Segmental Doppler at the right ankle demonstrates monophasic waveforms. Left Lower Extremity: Segmental Doppler at the left ankle demonstrates monophasic waveforms. IMPRESSION: Resting ABI the bilateral lower extremities in the moderate range arterial occlusive disease, with the segmental exam demonstrating occlusive disease proximal to the ankles. Signed, Dulcy Fanny. Dellia Nims, RPVI [facility] Radiology Electronically Signed   By: Corrie Mckusick D.O.   On: 03/01/2019 13:52   Dg Chest Port 1 View  Result Date: 02/08/2019 CLINICAL DATA:  Cough, elevated D-dimer EXAM: PORTABLE CHEST 1 VIEW COMPARISON:  February 04, 2019 FINDINGS: There is cardiomegaly. Increased reticulonodular opacities are again noted throughout both lungs. Calcified pleural plaques are again noted. A right-sided PICC catheter is seen with the tip just entering the upper SVC. No pneumothorax. No acute osseous abnormality. IMPRESSION: Unchanged reticulonodular opacities throughout both lungs, likely due to chronic lung changes, however cannot exclude superimposed infectious etiology. Electronically Signed   By: Prudencio Pair M.D.   On: 02/08/2019 22:28   US Abdomen Limited Ruq  Result Date: 02/09/2019 CLINICAL DATA:  Acute hepatitis. EXAM: ULTRASOUND ABDOMEN LIMITED RIGHT UPPER QUADRANT COMPARISON:  CT scan of February 08, 2019. FINDINGS: Gallbladder: Gallbladder distention is noted without cholelithiasis or gallbladder wall thickening. No pericholecystic fluid is noted. The absence or presence  of sonographic Murphy's sign was not reported by the  technologist. Common bile duct: Diameter: 4 mm which is within normal limits. Liver: No focal lesion identified. Within normal limits in parenchymal echogenicity. Portal vein is patent on color Doppler imaging with normal direction of blood flow towards the liver. Other: None. IMPRESSION: Gallbladder distention is noted without cholelithiasis. No gallbladder wall thickening or pericholecystic fluid is noted. No other abnormality seen in the right upper quadrant of the abdomen. Electronically Signed   By: Marijo Conception M.D.   On: 02/09/2019 08:59    Assessment and Plan  Hospital follow-up for hepatitis/AKI/acute encephalopathy/stage IV sacral decubitus ulcer with bilateral heel ulcers-patient took a cognitive hit during his past hospitalization is eating very poorly.  He remains to be seen the patient will eventually return to his baseline; will continue supportive care, nutritional supplements, especially prostat to help heal his stage IV decubitus and continued wound care.  Will monitor weights closely.  Discussed with midlevel NP    Time spent greater than 35 minutes;> 50% of time with patient was spent reviewing records, labs, tests and studies, counseling and developing plan of care  Hennie Duos, MD

## 2019-03-06 ENCOUNTER — Encounter: Payer: Self-pay | Admitting: Internal Medicine

## 2019-03-06 DIAGNOSIS — Z09 Encounter for follow-up examination after completed treatment for conditions other than malignant neoplasm: Secondary | ICD-10-CM | POA: Insufficient documentation

## 2019-03-08 ENCOUNTER — Encounter: Payer: Self-pay | Admitting: Adult Health

## 2019-03-08 ENCOUNTER — Non-Acute Institutional Stay (SKILLED_NURSING_FACILITY): Payer: Medicare Other | Admitting: Adult Health

## 2019-03-08 DIAGNOSIS — E43 Unspecified severe protein-calorie malnutrition: Secondary | ICD-10-CM | POA: Diagnosis not present

## 2019-03-08 DIAGNOSIS — H401130 Primary open-angle glaucoma, bilateral, stage unspecified: Secondary | ICD-10-CM | POA: Diagnosis not present

## 2019-03-08 DIAGNOSIS — L89620 Pressure ulcer of left heel, unstageable: Secondary | ICD-10-CM

## 2019-03-08 DIAGNOSIS — L89610 Pressure ulcer of right heel, unstageable: Secondary | ICD-10-CM

## 2019-03-08 DIAGNOSIS — L89320 Pressure ulcer of left buttock, unstageable: Secondary | ICD-10-CM

## 2019-03-08 NOTE — Progress Notes (Signed)
Location:    [facility] [place] Room Number: 157/D Place of Service:  SNF (31)   CODE STATUS: Full Code  Allergies  Allergen Reactions  . Codeine Itching  . Trileptal [Oxcarbazepine]     Other reaction(s): Other (See Comments) Abnormal sodium levels Hyponatremia   Chief Complaint  Patient presents with  . 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      HPI:  He is a 73 year old short term rehab patient being seen for the management of his chronic illnesses: malnutrition; glaucoma; and pressure ulcerations. He denies any pain; no cough or shortness of breath. He denies any changes in his appetite.   Past Medical History:  Diagnosis Date  . AAA (abdominal aortic aneurysm), stable 04/20/2014  . Arthritis   . 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 CAD (coronary artery disease)    a. s/p cath in 2015 showing occluded RCA and high-grade LCx stenosis treated with DESx2 to LCx  . Cancer ([place])   . Depression   . Diverticulitis    hospital 2011 [facility]  . Diverticulosis   . GERD (gastroesophageal reflux disease)   . 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 Hernia of unspecified site of abdominal cavity without mention of obstruction or gangrene    hiatal  . Hyperlipidemia LDL goal <70 04/20/2014  . Hypertension   . IBS (irritable bowel syndrome)   . Myocardial infarction ([place]) 01/2014   NSTEMI  . Peripheral vascular disease (HCC)    ankle brachial index of 0.79 on the right and 0.61 on the left.   . Pneumonia   . 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 Reflex sympathetic dystrophy   . 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 SOB (shortness of breath)     Past Surgical History:  Procedure Laterality Date  . CARDIAC CATHETERIZATION    . CATARACT  EXTRACTION    . CORONARY ANGIOPLASTY    . HERNIA REPAIR    . LEFT HEART CATHETERIZATION WITH CORONARY ANGIOGRAM N/A 01/17/2014   Procedure: LEFT HEART CATHETERIZATION WITH CORONARY ANGIOGRAM;  Surgeon: Leonie Man, MD;  Location: [facility] CATH LAB;  Service: Cardiovascular;  Laterality: N/A;  . left shoulder      x 3  . NISSEN FUNDOPLICATION    . right elbow surgery     x 2  . right knee arthroscopy    . squamous cell skin cancer     Left Hand  . SUPRAVENTRICULAR TACHYCARDIA ABLATION N/A 01/16/2014   Procedure: SUPRAVENTRICULAR TACHYCARDIA ABLATION;  Surgeon: Evans Lance, MD;  Location: [facility] CATH LAB;  Service: Cardiovascular;  Laterality: N/A;    Social History   Socioeconomic History  . Marital status: Widowed    Spouse name: Not on file  . Number of children: 2  . Years of education: 43  . Highest education level: Not on file  Occupational History  . Occupation: [employer]  . Financial resource strain: Not on file  . Food insecurity    Worry: Not on file    Inability: Not on file  . Transportation needs    Medical: Not on file    Non-medical: Not on file  Tobacco Use  . Smoking status: Former Smoker  Packs/day: 2.50    Years: 50.00    Pack years: 125.00    Types: Cigarettes    Quit date: 05/05/2005    Years since quitting: 13.8  . Smokeless tobacco: Former Systems developer    Quit date: 05/03/2012  Substance and Sexual Activity  . Alcohol use: No    Alcohol/week: 0.0 standard drinks    Comment: last use 2013  . Drug use: No  . Sexual activity: Not on file    Comment: widowed, Veteran  Lifestyle  . Physical activity    Days per week: Not on file    Minutes per session: Not on file  . Stress: Not on file  Relationships  . Social Herbalist on phone: Not on file    Gets together: Not on file    Attends religious service: Not on file    Active member of club or organization: Not on file    Attends meetings of clubs or organizations: Not on file     Relationship status: Not on file  . Intimate partner violence    Fear of current or ex partner: Not on file    Emotionally abused: Not on file    Physically abused: Not on file    Forced sexual activity: Not on file  Other Topics Concern  . Not on file  Social History Narrative   Patient drinks 3 cups of caffeine daily.   Patient is right handed.   Family History  Problem Relation Age of Onset  . Colon cancer Mother   . Colon cancer Maternal Aunt   . Colon cancer Maternal Uncle   . Heart attack Neg Hx   . Stroke Neg Hx       VITAL SIGNS BP 111/68   Pulse 66   Temp 98 F (36.7 C) (Oral)   Resp 20   Ht 6' (1.829 m)   Wt 199 lb 8 oz (90.5 kg)   SpO2 94%   BMI 27.06 kg/m   Outpatient Encounter Medications as of 03/08/2019  Medication Sig  . acetaminophen (TYLENOL) 650 MG CR tablet Take 650 mg by mouth 3 (three) times daily.  [name] alprazolam (XANAX) 2 MG tablet Take 1 tablet (2 mg total) by mouth 2 (two) times daily.  . Amino Acids-Protein Hydrolys (FEEDING SUPPLEMENT, PRO-STAT SUGAR FREE 64,) LIQD Take 30 mLs by mouth 2 (two) times daily between meals.   [name] amitriptyline (ELAVIL) 100 MG tablet Take 100 mg by mouth at bedtime.  [name] apixaban (ELIQUIS) 2.5 MG TABS tablet Take 5 mg by mouth 2 (two) times daily.   Roseanne Kaufman [place]-Castor Oil (VENELEX) OINT Apply 1 application topically See admin instructions. Apply to coccyx, sacrum, bilateral buttocks each shift and prn  . bisacodyl (DULCOLAX) 5 MG EC tablet Take 5 mg by mouth daily as needed for mild constipation or moderate constipation.  . budesonide-formoterol (SYMBICORT) 160-4.5 MCG/ACT inhaler Inhale 2 puffs into the lungs 2 (two) times daily.  . camphor-menthol (ANTI-ITCH) lotion Apply 1 application topically 2 (two) times daily as needed for itching. Sparingly  . clotrimazole (LOTRIMIN) 1 % cream Instructions: apply to buttock rash three times daily Three Times A Day 09:00 AM, 02:00 PM, 09:00 PM  . collagenase (SANTYL) ointment  Apply 1 application topically daily. Apply to left buttock wound per treatment orders  . digoxin (LANOXIN) 0.125 MG tablet Take 0.125 mg by mouth daily.  [name] docusate sodium (COLACE) 100 MG capsule Take 100 mg by mouth daily as needed for  mild constipation.  . finasteride (PROSCAR) 5 MG tablet Take 5 mg by mouth every morning.   . gabapentin (NEURONTIN) 800 MG tablet Take 1 tablet (800 mg total) by mouth 3 (three) times daily.  [name] guaiFENesin (MUCINEX) 600 MG 12 hr tablet Take 600 mg by mouth 2 (two) times daily.   [name] ipratropium-albuterol (DUONEB) 0.5-2.5 (3) MG/3ML SOLN Take 3 mLs by nebulization every 4 (four) hours as needed (For wheezing and worsening shortness of breath or respiratory distress).  [name] latanoprost (XALATAN) 0.005 % ophthalmic solution Place 1 drop into both eyes at bedtime.  . metoprolol tartrate (LOPRESSOR) 25 MG tablet Take 50 mg by mouth 2 (two) times daily.   . Multiple Vitamins-Minerals (MULTIVITAMIN WITH MINERALS) tablet Take 1 tablet by mouth daily.  . nitroGLYCERIN (NITROSTAT) 0.4 MG SL tablet Place 1 tablet (0.4 mg total) under the tongue every 5 (five) minutes x 3 doses as needed for chest pain.  . NON FORMULARY Diet: Regular, NAS, Consistent Carbohydrate  . ondansetron (ZOFRAN) 4 MG tablet Take 4 mg by mouth every 6 (six) hours as needed for nausea or vomiting.   . OXYGEN Place 4 L/min into the nose continuous.   [name] spironolactone (ALDACTONE) 25 MG tablet Take 25 mg by mouth daily.  . tamsulosin (FLOMAX) 0.4 MG CAPS capsule Take 0.4 mg by mouth at bedtime.  . torsemide (DEMADEX) 20 MG tablet Take 20 mg by mouth 2 (two) times daily between meals.   . [DISCONTINUED] lamoTRIgine (LAMICTAL) 100 MG tablet Take 1 tablet (100 mg total) by mouth 2 (two) times daily.   No facility-administered encounter medications on file as of 03/08/2019.      SIGNIFICANT DIAGNOSTIC EXAMS   PREVIOUS;   01-01-19: chest x-ray:  1. Cardiomegaly with vascular congestion. 2. Small bilateral  pleural effusions and bibasilar densities similar to prior radiograph  01-02-19: bilateral lower extremity venous doppler:  1. No evidence of deep venous thrombosis in either lower extremity. 2. Increased pulsatility of the venous waveforms suggests elevated right heart pressures. Findings can be seen in tricuspid regurgitation, right heart failure, pulmonary hypertension and COPD.  01-02-19: 2-d echo:   1. The left ventricle has normal systolic function, with an ejection fraction of 55-60%. The cavity size was normal. There is mildly increased left ventricular wall thickness. Left ventricular diastolic Doppler parameters are indeterminate.  2. The right ventricle has normal systolic function. The cavity was normal. There is no increase in right ventricular wall thickness. Right ventricular systolic pressure could not be assessed.  3. No evidence of mitral valve stenosis.  4. The aortic valve is tricuspid. Mild thickening of the aortic valve. Mild calcification of the aortic valve. No stenosis of the aortic valve.  5. The aorta is normal unless otherwise noted.  6. The ascending aorta is normal in size and structure.  01-03-19: chest x-ray: Stable cardiomegaly, pulmonary vascular congestion, and bilateral pleural-parenchymal scarring. No acute findings.  01-28-19: chest x-ray: Similar appearance of reticulonodular opacities in the mid and lower lungs, which has progressed slowly from 2016 on chest x-ray exams. While the changes most likely represent chronic interstitial fibrosis/scarring, superimposed infection cannot be excluded radiographically   02-04-19: chest x-ray: Extensive bilateral calcified pleural plaques. Chronic increased markings throughout the lungs, favor scarring. No definite acute process   02-08-19: chest x-ray; Unchanged reticulonodular opacities throughout both lungs, likely due to chronic lung changes, however cannot exclude superimposed infectious etiology  02-08-19: ct of  abdomen and pelvis;  1.  No acute intra-abdominal  process. 2. New small ascites. 3. Unchanged 3.2 cm infrarenal abdominal aortic aneurysm. Recommend followup by ultrasound in 3 years. T . Aortic aneurysm  4.  Aortic atherosclerosis   02-09-19: right upper quad ultrasound: Gallbladder distention is noted without cholelithiasis. No gallbladder wall thickening or pericholecystic fluid is noted. No other abnormality seen in the right upper quadrant of the abdomen.  02-11-19: ct angio of chest:  1.  Negative examination for pulmonary embolism. 2. There are large, bilateral calcified pleural plaques throughout. Small bilateral pleural effusions, generally loculated and chronic appearing. 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 3. Bulky mediastinal and hilar lymphadenopathy, similar to prior examination dated 04/24/2014, largest right paratracheal lymph nodes measuring 2.4 x 2.4 cm (series 4, image 25), of uncertain significance given long-term stability, perhaps reactive to extensive pleural disease. 4.  Cardiomegaly and coronary artery disease. 5. Enlargement of the main pulmonary artery, as can be seen in pulmonary hypertension. 6.  Ascites in the included upper abdomen. 7.  Aortic Atherosclerosis  02-14-19: MRI of brain 1. Significant and progressive motion degradation. 2. Diffusion imaging is diagnostic and negative for acute infarct. 3. Senescent changes without significant progression since 2015.  02-23-19: chest x-ray: mild CHF bilateral pleural effusion  NO NEW EXAMS.     LABS REVIEWED PREVIOUS;   01-01-19: wbc 8.2; hgb 14.2; hct 51.1; mcv 103.4; plt 216; glucose 100; bun 46; creat 1.88; k+ 5.8; na++ 142; ca 8.7; liver normal albumin 3.1 01-02-19: wbc 5.3; hgb 145; hct 51.8; mcv 101.2; plt 219; glucose 132; bun 45; creat 1.52; k+ 4.7; na++ 143; ca 9.0 ;liver normal albumin 3.3; tsh 0.435 vit B 12; 296; urine culture: staphylococcus species 01-05-19: glucose 129; bun 37;  creat 0.98; k+ 4.1; na++ 134; ca 8.5; mag 1.9 01-10-19: glucose 111; bun 38; creat 0.85; k+ 3.5; na++ 130; ca 8.7  01-28-19: wbc 8.6; hgb 11.0 hct 36.1; mcv 93.0 plt 277; glucose 125; bun 20; creat 1.08; k+ 4.5; na++ 134; ca 8.9 blood and urine cultures: no growth. 02-04-19: wbc 18.0; hgb 11.0; hct 36.3; mcv 92.4; plt 545 02-08-19: wbc 27.4; hgb 10.4; hct 34.0; mcv 93.7; plt 478; glucose 201; bun 36; creat 2.31; k+ 4.8; na++ 131; ca 8.7; ast 1485.0; alt 846.0; albumin 2.1; amalse 16; lipase 22; direct bili 0.1 d-dimer: 4.15  c-diff: neg  Hepatitis panel: neg 02-09-19: wbc 16.2; hgb 9.7; hct 32.7; mcv 95.1; plt 347; glucose 106; bun 37; creat 2.14; k+ 4.0; na++ 133; ca 8.1; ast 651; alt 801; albumin 2.0; tsh 2.140; INR 2.4 02-12-19: wbc 14.8; hgb 9.9; hct 33.3; mcv 95.4; plt 324; glucose 96; bun 24; creat 0.96; k+ 3.9; na++ 136; ca 8.9; ast 77 alt 272; albumin 2.1 02-14-19: wbc 12.5; hgb 9.8; hct 32.9; mcv 97.1 plt 312; glucose 114; bun 25; creat 0.95; k+ 4.1; na++ 138; ca 9.2; ast 18; alt 134; albumin 2.0 02-15-19: INR 1.6; d-dimer: 3.24 02-22-19: glucose 100; bun 13; creat 1.01; k+ 4.0; na++ 140; ca 8.8   NO NEW LABS.    Review of Systems  Constitutional: Negative for malaise/fatigue.  Respiratory: Negative for cough and shortness of breath.   Cardiovascular: Negative for chest pain, palpitations and leg swelling.  Gastrointestinal: Negative for abdominal pain, constipation and heartburn.  Musculoskeletal: Negative for back pain, joint pain and myalgias.  Skin: Negative.   Neurological: Negative for dizziness.  Psychiatric/Behavioral: The patient is not nervous/anxious.     Physical Exam Constitutional:      General: He is not  in acute distress.    Appearance: He is well-developed. He is not diaphoretic.  Neck:     Musculoskeletal: Neck supple.     Thyroid: No thyromegaly.  Cardiovascular:     Rate and Rhythm: Normal rate. Rhythm irregular.     Pulses: Normal pulses.     Heart sounds:  Normal heart sounds.     Comments: History of SVT ablation; ptca Pulmonary:     Effort: Pulmonary effort is normal. No respiratory distress.     Breath sounds: Normal breath sounds.     Comments: 02 dependent  Abdominal:     General: Bowel sounds are normal. There is no distension.     Palpations: Abdomen is soft.     Tenderness: There is no abdominal tenderness.  Genitourinary:    Comments: Has foley  Musculoskeletal: Normal range of motion.     Right lower leg: No edema.     Left lower leg: No edema.  Lymphadenopathy:     Cervical: No cervical adenopathy.  Skin:    General: Skin is warm and dry.     Comments: Left heel: unstaged soft eschar: 7 x 8 cm Right heel unstaged: soft eschar: 4.8 x 4 cm Left buttock: unstaged: soft eschar: 4.9 x 8.0 x 1.3 cm  No signs of infection present     Neurological:     Mental Status: He is alert and oriented to person, place, and time.  Psychiatric:        Mood and Affect: Mood normal.       ASSESSMENT/ PLAN:  TODAY ;  1. Protein calorie malnutrition severe: is stable albumin 2.0 continue prostat twice daily and supplements as ordered will monitor   2. 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 Unstaged pressure ulcer left buttock/decubitus ulcer bilateral heels unstagable: will continue current treatments and supplements.    PREVIOUS  4.  Urinary retention is stable has foley has failed 2 voiding trial;  will continue flomax 0.4 mg daily and proscar 5 mg daily   5. GERD without esophagitis: is stable at this time will continue zofram 4 mg every 6 hours as needed  for nausea  6. Acute diastolic CHF (congestive heart failure) EF 55-60% (12-06-18) will begin demadex 20 mg twice daily with k+ 10 meq daily lopressor 25 mg twice daily has ntg as needed for chest pain.   7. Hypokalemia: is stable k+ 4.0 will continue k+ 10 meq daily   8. Generalized anxiety disorder: is stable will  continue xanax 2 mg twice daily   9. Acute on chronic respiratory failure with hypoxia and hypercapnia/copd with chronic bronchitis asbestosis is stable 02 dependent will continue mucinex 600 mg twice daily duoneb every 4 hours as needed symbicort 160/4.5 mcg 2 puffs twice daily   10. Atrial flutter/SVT (supraventricular tachycardia) s/p ablation heart rate stable will continue lopressor 25 mg twice daily and digoxin 0.125 mg daily for rate control eliquis 5 mg twice daily   11. Hyperlipidemia LDL goal <70 is off lipitor due to recent hepatitis   12. Peripheral vascular disease/heredity and idiopathic peripheral neuropathy: is stable will continue elevil 100 mg nightly and neurontin 80 mg three times daily       MD is aware of resident's narcotic use and is in agreement with current plan of care. We will attempt to wean resident as appropriate.  Ok Edwards NP [facility]  Contact [phone] Monday through Friday 8am- 5pm  After hours call [phone]

## 2019-03-09 ENCOUNTER — Encounter: Payer: Self-pay | Admitting: Adult Health

## 2019-03-09 NOTE — Progress Notes (Signed)
Location:  [place] Room Number: 157-D Place of Service:  SNF (31)   CODE STATUS: Full Code  Allergies  Allergen Reactions  . Codeine Itching  . Trileptal [Oxcarbazepine]     Other reaction(s): Other (See Comments) Abnormal sodium levels Hyponatremia    Chief Complaint  Patient presents with  . Acute Visit    Patient is seen for weight gain.     HPI:    Past Medical History:  Diagnosis Date  . AAA (abdominal aortic aneurysm), stable 04/20/2014  . Arthritis   . 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 CAD (coronary artery disease)    a. s/p cath in 2015 showing occluded RCA and high-grade LCx stenosis treated with DESx2 to LCx  . Cancer ([place])   . Depression   . Diverticulitis    hospital 2011 [facility]  . Diverticulosis   . GERD (gastroesophageal reflux disease)   . 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 Hernia of unspecified site of abdominal cavity without mention of obstruction or gangrene    hiatal  . Hyperlipidemia LDL goal <70 04/20/2014  . Hypertension   . IBS (irritable bowel syndrome)   . Myocardial infarction ([place]) 01/2014   NSTEMI  . Peripheral vascular disease ([facility])    ankle brachial index of 0.79 on the right and 0.61 on the left.   . Pneumonia   . 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 Reflex sympathetic dystrophy   . 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 SOB (shortness of breath)     Past Surgical History:  Procedure Laterality Date  . CARDIAC CATHETERIZATION    . CATARACT EXTRACTION    . CORONARY ANGIOPLASTY    . HERNIA REPAIR    . LEFT HEART CATHETERIZATION WITH CORONARY ANGIOGRAM N/A 01/17/2014   Procedure: LEFT HEART CATHETERIZATION WITH CORONARY ANGIOGRAM;  Surgeon: Leonie Man, MD;  Location: [facility] CATH LAB;  Service: Cardiovascular;  Laterality: N/A;  . left shoulder      x 3  . NISSEN FUNDOPLICATION    . right elbow surgery     x 2  . right knee arthroscopy    . squamous cell skin cancer     Left Hand  . SUPRAVENTRICULAR  TACHYCARDIA ABLATION N/A 01/16/2014   Procedure: SUPRAVENTRICULAR TACHYCARDIA ABLATION;  Surgeon: Evans Lance, MD;  Location: [facility] CATH LAB;  Service: Cardiovascular;  Laterality: N/A;    Social History   Socioeconomic History  . Marital status: Widowed    Spouse name: Not on file  . Number of children: 2  . Years of education: 32  . Highest education level: Not on file  Occupational History  . Occupation: [employer]  . Financial resource strain: Not on file  . Food insecurity    Worry: Not on file    Inability: Not on file  . Transportation needs    Medical: Not on file    Non-medical: Not on file  Tobacco Use  . Smoking status: Former Smoker    Packs/day: 2.50    Years: 50.00    Pack years: 125.00    Types: Cigarettes    Quit date: 05/05/2005    Years since quitting: 13.8  . Smokeless tobacco: Former Systems developer    Quit date: 05/03/2012  Substance and Sexual Activity  . Alcohol use: No    Alcohol/week: 0.0 standard drinks    Comment: last use 2013  . Drug use: No  . Sexual activity: Not on file  Comment: widowed, Veteran  Lifestyle  . Physical activity    Days per week: Not on file    Minutes per session: Not on file  . Stress: Not on file  Relationships  . Social Herbalist on phone: Not on file    Gets together: Not on file    Attends religious service: Not on file    Active member of club or organization: Not on file    Attends meetings of clubs or organizations: Not on file    Relationship status: Not on file  . Intimate partner violence    Fear of current or ex partner: Not on file    Emotionally abused: Not on file    Physically abused: Not on file    Forced sexual activity: Not on file  Other Topics Concern  . Not on file  Social History Narrative   Patient drinks 3 cups of caffeine daily.   Patient is right handed.   Family History  Problem Relation Age of Onset  . Colon cancer Mother   . Colon cancer Maternal Aunt   . Colon  cancer Maternal Uncle   . Heart attack Neg Hx   . Stroke Neg Hx       VITAL SIGNS BP 129/75   Pulse 71   Temp (!) 97 F (36.1 C) (Oral)   Resp 20   Ht 6' (1.829 m)   Wt 196 lb 4.8 oz (89 kg)   SpO2 90%   BMI 26.62 kg/m   Outpatient Encounter Medications as of 03/09/2019  Medication Sig  . acetaminophen (TYLENOL) 650 MG CR tablet Take 650 mg by mouth 3 (three) times daily.  [name] alprazolam (XANAX) 2 MG tablet Take 1 tablet (2 mg total) by mouth 2 (two) times daily.  . Amino Acids-Protein Hydrolys (FEEDING SUPPLEMENT, PRO-STAT SUGAR FREE 64,) LIQD Take 30 mLs by mouth 2 (two) times daily between meals.   [name] amitriptyline (ELAVIL) 100 MG tablet Take 100 mg by mouth at bedtime.  [name] apixaban (ELIQUIS) 2.5 MG TABS tablet Take 5 mg by mouth 2 (two) times daily.   Roseanne Kaufman [place]-Castor Oil (VENELEX) OINT Apply 1 application topically See admin instructions. Apply to coccyx, sacrum, bilateral buttocks each shift and prn  . bisacodyl (DULCOLAX) 5 MG EC tablet Take 5 mg by mouth daily as needed for mild constipation or moderate constipation.  . budesonide-formoterol (SYMBICORT) 160-4.5 MCG/ACT inhaler Inhale 2 puffs into the lungs 2 (two) times daily.  . camphor-menthol (ANTI-ITCH) lotion Apply 1 application topically 2 (two) times daily as needed for itching. Sparingly  . clotrimazole (LOTRIMIN) 1 % cream Instructions: apply to buttock rash three times daily Three Times A Day 09:00 AM, 02:00 PM, 09:00 PM  . collagenase (SANTYL) ointment Apply 1 application topically daily. Apply to left buttock wound per treatment orders  . digoxin (LANOXIN) 0.125 MG tablet Take 0.125 mg by mouth daily.  [name] docusate sodium (COLACE) 100 MG capsule Take 100 mg by mouth daily as needed for mild constipation.  . finasteride (PROSCAR) 5 MG tablet Take 5 mg by mouth every morning.   . gabapentin (NEURONTIN) 800 MG tablet Take 1 tablet (800 mg total) by mouth 3 (three) times daily.  [name] guaiFENesin (MUCINEX) 600 MG 12 hr  tablet Take 600 mg by mouth 2 (two) times daily.   [name] ipratropium-albuterol (DUONEB) 0.5-2.5 (3) MG/3ML SOLN Take 3 mLs by nebulization every 4 (four) hours as needed (For wheezing and worsening shortness of breath  or respiratory distress).  [name] latanoprost (XALATAN) 0.005 % ophthalmic solution Place 1 drop into both eyes at bedtime.  . metoprolol tartrate (LOPRESSOR) 25 MG tablet Take 50 mg by mouth 2 (two) times daily.   . Multiple Vitamins-Minerals (MULTIVITAMIN WITH MINERALS) tablet Take 1 tablet by mouth daily.  . nitroGLYCERIN (NITROSTAT) 0.4 MG SL tablet Place 1 tablet (0.4 mg total) under the tongue every 5 (five) minutes x 3 doses as needed for chest pain.  . NON FORMULARY Diet: Regular, NAS, Consistent Carbohydrate  . ondansetron (ZOFRAN) 4 MG tablet Take 4 mg by mouth every 6 (six) hours as needed for nausea or vomiting.   . OXYGEN Place 4 L/min into the nose continuous.   [name] spironolactone (ALDACTONE) 25 MG tablet Take 25 mg by mouth daily.  . tamsulosin (FLOMAX) 0.4 MG CAPS capsule Take 0.4 mg by mouth at bedtime.  . torsemide (DEMADEX) 20 MG tablet Take 20 mg by mouth 2 (two) times daily between meals.   . [DISCONTINUED] lamoTRIgine (LAMICTAL) 100 MG tablet Take 1 tablet (100 mg total) by mouth 2 (two) times daily.   No facility-administered encounter medications on file as of 03/09/2019.      SIGNIFICANT DIAGNOSTIC EXAMS       ASSESSMENT/ PLAN:    MD is aware of resident's narcotic use and is in agreement with current plan of care. We will attempt to wean resident as appropriate.  Ok Edwards NP [facility]  Contact [phone] Monday through Friday 8am- 5pm  After hours call [phone]   This encounter was created in error - please disregard.

## 2019-03-12 DIAGNOSIS — E43 Unspecified severe protein-calorie malnutrition: Secondary | ICD-10-CM | POA: Insufficient documentation

## 2019-03-18 ENCOUNTER — Encounter: Payer: Self-pay | Admitting: Adult Health

## 2019-03-18 ENCOUNTER — Non-Acute Institutional Stay (SKILLED_NURSING_FACILITY): Payer: Medicare Other | Admitting: Adult Health

## 2019-03-18 DIAGNOSIS — R339 Retention of urine, unspecified: Secondary | ICD-10-CM | POA: Diagnosis not present

## 2019-03-18 DIAGNOSIS — I5032 Chronic diastolic (congestive) heart failure: Secondary | ICD-10-CM

## 2019-03-18 DIAGNOSIS — K219 Gastro-esophageal reflux disease without esophagitis: Secondary | ICD-10-CM | POA: Diagnosis not present

## 2019-03-18 NOTE — Progress Notes (Signed)
Location:  [place] Room Number: 102-P Place of Service:  SNF (31)   CODE STATUS: Full Code  Allergies  Allergen Reactions  . Codeine Itching  . Trileptal [Oxcarbazepine]     Other reaction(s): Other (See Comments) Abnormal sodium levels Hyponatremia    Chief Complaint  Patient presents with  . Medical Management of Chronic Issues        Urinary retention   GERD without esophagitis:  Acute diastolic CHF (Congestive heart failure)    HPI:  He is a 73 year old short term rehab patient being seen for the management of his chronic illnesses; urine retention; gerd; chf. He denies any heart burn; no shortness of breath no leg swelling.   Past Medical History:  Diagnosis Date  . AAA (abdominal aortic aneurysm), stable 04/20/2014  . Arthritis   . 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 CAD (coronary artery disease)    a. s/p cath in 2015 showing occluded RCA and high-grade LCx stenosis treated with DESx2 to LCx  . Cancer ([place])   . Depression   . Diverticulitis    hospital 2011 [facility]  . Diverticulosis   . GERD (gastroesophageal reflux disease)   . 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 Hernia of unspecified site of abdominal cavity without mention of obstruction or gangrene    hiatal  . Hyperlipidemia LDL goal <70 04/20/2014  . Hypertension   . IBS (irritable bowel syndrome)   . Myocardial infarction ([place]) 01/2014   NSTEMI  . Peripheral vascular disease ([facility])    ankle brachial index of 0.79 on the right and 0.61 on the left.   . Pneumonia   . 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 Reflex sympathetic dystrophy   . 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 SOB (shortness of breath)     Past Surgical History:  Procedure Laterality Date  . CARDIAC CATHETERIZATION    . CATARACT EXTRACTION    . CORONARY ANGIOPLASTY    . HERNIA REPAIR    . LEFT HEART CATHETERIZATION WITH CORONARY ANGIOGRAM N/A 01/17/2014   Procedure: LEFT HEART CATHETERIZATION WITH CORONARY ANGIOGRAM;   Surgeon: Leonie Man, MD;  Location: [facility] CATH LAB;  Service: Cardiovascular;  Laterality: N/A;  . left shoulder      x 3  . NISSEN FUNDOPLICATION    . right elbow surgery     x 2  . right knee arthroscopy    . squamous cell skin cancer     Left Hand  . SUPRAVENTRICULAR TACHYCARDIA ABLATION N/A 01/16/2014   Procedure: SUPRAVENTRICULAR TACHYCARDIA ABLATION;  Surgeon: Evans Lance, MD;  Location: [facility] CATH LAB;  Service: Cardiovascular;  Laterality: N/A;    Social History   Socioeconomic History  . Marital status: Widowed    Spouse name: Not on file  . Number of children: 2  . Years of education: 55  . Highest education level: Not on file  Occupational History  . Occupation: [employer]  . Financial resource strain: Not on file  . Food insecurity    Worry: Not on file    Inability: Not on file  . Transportation needs    Medical: Not on file    Non-medical: Not on file  Tobacco Use  . Smoking status: Former Smoker    Packs/day: 2.50    Years: 50.00    Pack years: 125.00    Types: Cigarettes    Quit date: 05/05/2005    Years since quitting: 13.8  .  Smokeless tobacco: Former Systems developer    Quit date: 05/03/2012  Substance and Sexual Activity  . Alcohol use: No    Alcohol/week: 0.0 standard drinks    Comment: last use 2013  . Drug use: No  . Sexual activity: Not on file    Comment: widowed, Veteran  Lifestyle  . Physical activity    Days per week: Not on file    Minutes per session: Not on file  . Stress: Not on file  Relationships  . Social Herbalist on phone: Not on file    Gets together: Not on file    Attends religious service: Not on file    Active member of club or organization: Not on file    Attends meetings of clubs or organizations: Not on file    Relationship status: Not on file  . Intimate partner violence    Fear of current or ex partner: Not on file    Emotionally abused: Not on file    Physically abused: Not on file    Forced  sexual activity: Not on file  Other Topics Concern  . Not on file  Social History Narrative   Patient drinks 3 cups of caffeine daily.   Patient is right handed.   Family History  Problem Relation Age of Onset  . Colon cancer Mother   . Colon cancer Maternal Aunt   . Colon cancer Maternal Uncle   . Heart attack Neg Hx   . Stroke Neg Hx       VITAL SIGNS BP 121/72   Pulse 68   Temp (!) 96.8 F (36 C) (Oral)   Resp 18   Ht 6' (1.829 m)   Wt 191 lb 3.2 oz (86.7 kg)   SpO2 92%   BMI 25.93 kg/m   Outpatient Encounter Medications as of 03/18/2019  Medication Sig  . acetaminophen (TYLENOL) 650 MG CR tablet Take 650 mg by mouth 3 (three) times daily.  [name] alprazolam (XANAX) 2 MG tablet Take 1 tablet (2 mg total) by mouth 2 (two) times daily.  . Amino Acids-Protein Hydrolys (FEEDING SUPPLEMENT, PRO-STAT SUGAR FREE 64,) LIQD Take 30 mLs by mouth 2 (two) times daily between meals.   [name] amitriptyline (ELAVIL) 100 MG tablet Take 100 mg by mouth at bedtime.  [name] apixaban (ELIQUIS) 2.5 MG TABS tablet Take 5 mg by mouth 2 (two) times daily.   Roseanne Kaufman [place]-Castor Oil (VENELEX) OINT Apply 1 application topically See admin instructions. Apply to coccyx, sacrum, bilateral buttocks each shift and prn  . bisacodyl (DULCOLAX) 5 MG EC tablet Take 5 mg by mouth daily as needed for mild constipation or moderate constipation.  . budesonide-formoterol (SYMBICORT) 160-4.5 MCG/ACT inhaler Inhale 2 puffs into the lungs 2 (two) times daily.  . camphor-menthol (ANTI-ITCH) lotion Apply 1 application topically 2 (two) times daily as needed for itching. Sparingly  . clotrimazole (LOTRIMIN) 1 % cream Instructions: apply to buttock rash three times daily Three Times A Day 09:00 AM, 02:00 PM, 09:00 PM  . collagenase (SANTYL) ointment Apply 1 application topically daily. Apply to left buttock wound per treatment orders  . digoxin (LANOXIN) 0.125 MG tablet Take 0.125 mg by mouth daily.  [name] docusate sodium (COLACE)  100 MG capsule Take 100 mg by mouth daily as needed for mild constipation.  . finasteride (PROSCAR) 5 MG tablet Take 5 mg by mouth every morning.   . gabapentin (NEURONTIN) 800 MG tablet Take 1 tablet (800 mg total) by mouth  3 (three) times daily.  [name] guaiFENesin (MUCINEX) 600 MG 12 hr tablet Take 600 mg by mouth 2 (two) times daily.   [name] ipratropium-albuterol (DUONEB) 0.5-2.5 (3) MG/3ML SOLN Take 3 mLs by nebulization every 4 (four) hours as needed (For wheezing and worsening shortness of breath or respiratory distress).  [name] latanoprost (XALATAN) 0.005 % ophthalmic solution Place 1 drop into both eyes at bedtime.  . metoprolol tartrate (LOPRESSOR) 25 MG tablet Take 50 mg by mouth 2 (two) times daily.   . Multiple Vitamins-Minerals (MULTIVITAMIN WITH MINERALS) tablet Take 1 tablet by mouth daily.  . nitroGLYCERIN (NITROSTAT) 0.4 MG SL tablet Place 1 tablet (0.4 mg total) under the tongue every 5 (five) minutes x 3 doses as needed for chest pain.  . NON FORMULARY Diet: Regular, NAS, Consistent Carbohydrate  . ondansetron (ZOFRAN) 4 MG tablet Take 4 mg by mouth every 6 (six) hours as needed for nausea or vomiting.   . OXYGEN Place 4 L/min into the nose continuous.   [name] spironolactone (ALDACTONE) 25 MG tablet Take 25 mg by mouth daily.  . tamsulosin (FLOMAX) 0.4 MG CAPS capsule Take 0.4 mg by mouth at bedtime.  . torsemide (DEMADEX) 20 MG tablet Take 20 mg by mouth 2 (two) times daily between meals.   . [DISCONTINUED] lamoTRIgine (LAMICTAL) 100 MG tablet Take 1 tablet (100 mg total) by mouth 2 (two) times daily.   No facility-administered encounter medications on file as of 03/18/2019.      SIGNIFICANT DIAGNOSTIC EXAMS   PREVIOUS;   01-01-19: chest x-ray:  1. Cardiomegaly with vascular congestion. 2. Small bilateral pleural effusions and bibasilar densities similar to prior radiograph  01-02-19: bilateral lower extremity venous doppler:  1. No evidence of deep venous thrombosis in either lower  extremity. 2. Increased pulsatility of the venous waveforms suggests elevated right heart pressures. Findings can be seen in tricuspid regurgitation, right heart failure, pulmonary hypertension and COPD.  01-02-19: 2-d echo:   1. The left ventricle has normal systolic function, with an ejection fraction of 55-60%. The cavity size was normal. There is mildly increased left ventricular wall thickness. Left ventricular diastolic Doppler parameters are indeterminate.  2. The right ventricle has normal systolic function. The cavity was normal. There is no increase in right ventricular wall thickness. Right ventricular systolic pressure could not be assessed.  3. No evidence of mitral valve stenosis.  4. The aortic valve is tricuspid. Mild thickening of the aortic valve. Mild calcification of the aortic valve. No stenosis of the aortic valve.  5. The aorta is normal unless otherwise noted.  6. The ascending aorta is normal in size and structure.  01-03-19: chest x-ray: Stable cardiomegaly, pulmonary vascular congestion, and bilateral pleural-parenchymal scarring. No acute findings.  01-28-19: chest x-ray: Similar appearance of reticulonodular opacities in the mid and lower lungs, which has progressed slowly from 2016 on chest x-ray exams. While the changes most likely represent chronic interstitial fibrosis/scarring, superimposed infection cannot be excluded radiographically   02-04-19: chest x-ray: Extensive bilateral calcified pleural plaques. Chronic increased markings throughout the lungs, favor scarring. No definite acute process   02-08-19: chest x-ray; Unchanged reticulonodular opacities throughout both lungs, likely due to chronic lung changes, however cannot exclude superimposed infectious etiology  02-08-19: ct of abdomen and pelvis;  1.  No acute intra-abdominal process. 2. New small ascites. 3. Unchanged 3.2 cm infrarenal abdominal aortic aneurysm. Recommend followup by ultrasound in 3 years.  T . Aortic aneurysm  4.  Aortic atherosclerosis   02-09-19:  right upper quad ultrasound: Gallbladder distention is noted without cholelithiasis. No gallbladder wall thickening or pericholecystic fluid is noted. No other abnormality seen in the right upper quadrant of the abdomen.  02-11-19: ct angio of chest:  1.  Negative examination for pulmonary embolism. 2. There are large, bilateral calcified pleural plaques throughout. Small bilateral pleural effusions, generally loculated and chronic appearing. 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 3. Bulky mediastinal and hilar lymphadenopathy, similar to prior examination dated 04/24/2014, largest right paratracheal lymph nodes measuring 2.4 x 2.4 cm (series 4, image 25), of uncertain significance given long-term stability, perhaps reactive to extensive pleural disease. 4.  Cardiomegaly and coronary artery disease. 5. Enlargement of the main pulmonary artery, as can be seen in pulmonary hypertension. 6.  Ascites in the included upper abdomen. 7.  Aortic Atherosclerosis  02-14-19: MRI of brain 1. Significant and progressive motion degradation. 2. Diffusion imaging is diagnostic and negative for acute infarct. 3. Senescent changes without significant progression since 2015.  02-23-19: chest x-ray: mild CHF bilateral pleural effusion  NO NEW EXAMS.     LABS REVIEWED PREVIOUS;   01-01-19: wbc 8.2; hgb 14.2; hct 51.1; mcv 103.4; plt 216; glucose 100; bun 46; creat 1.88; k+ 5.8; na++ 142; ca 8.7; liver normal albumin 3.1 01-02-19: wbc 5.3; hgb 145; hct 51.8; mcv 101.2; plt 219; glucose 132; bun 45; creat 1.52; k+ 4.7; na++ 143; ca 9.0 ;liver normal albumin 3.3; tsh 0.435 vit B 12; 296; urine culture: staphylococcus species 01-05-19: glucose 129; bun 37; creat 0.98; k+ 4.1; na++ 134; ca 8.5; mag 1.9 01-10-19: glucose 111; bun 38; creat 0.85; k+ 3.5; na++ 130; ca 8.7  01-28-19: wbc 8.6; hgb 11.0 hct 36.1; mcv 93.0 plt 277; glucose 125; bun  20; creat 1.08; k+ 4.5; na++ 134; ca 8.9 blood and urine cultures: no growth. 02-04-19: wbc 18.0; hgb 11.0; hct 36.3; mcv 92.4; plt 545 02-08-19: wbc 27.4; hgb 10.4; hct 34.0; mcv 93.7; plt 478; glucose 201; bun 36; creat 2.31; k+ 4.8; na++ 131; ca 8.7; ast 1485.0; alt 846.0; albumin 2.1; amalse 16; lipase 22; direct bili 0.1 d-dimer: 4.15  c-diff: neg  Hepatitis panel: neg 02-09-19: wbc 16.2; hgb 9.7; hct 32.7; mcv 95.1; plt 347; glucose 106; bun 37; creat 2.14; k+ 4.0; na++ 133; ca 8.1; ast 651; alt 801; albumin 2.0; tsh 2.140; INR 2.4 02-12-19: wbc 14.8; hgb 9.9; hct 33.3; mcv 95.4; plt 324; glucose 96; bun 24; creat 0.96; k+ 3.9; na++ 136; ca 8.9; ast 77 alt 272; albumin 2.1 02-14-19: wbc 12.5; hgb 9.8; hct 32.9; mcv 97.1 plt 312; glucose 114; bun 25; creat 0.95; k+ 4.1; na++ 138; ca 9.2; ast 18; alt 134; albumin 2.0 02-15-19: INR 1.6; d-dimer: 3.24 02-22-19: glucose 100; bun 13; creat 1.01; k+ 4.0; na++ 140; ca 8.8   TODAY;   02-24-19: glucose 98; bun 18 ;creat 1.10; k+ 4.5; an++ 136; ca 9.2 mag 1.7    Review of Systems  Constitutional: Negative for malaise/fatigue.  Respiratory: Negative for cough and shortness of breath.   Cardiovascular: Negative for chest pain, palpitations and leg swelling.  Gastrointestinal: Negative for abdominal pain, constipation and heartburn.  Musculoskeletal: Negative for back pain, joint pain and myalgias.  Skin: Negative.   Neurological: Negative for dizziness.  Psychiatric/Behavioral: The patient is not nervous/anxious.     Physical Exam Constitutional:      General: He is not in acute distress.    Appearance: He is well-developed. He is not diaphoretic.  Neck:  Musculoskeletal: Neck supple.     Thyroid: No thyromegaly.  Cardiovascular:     Rate and Rhythm: Normal rate. Rhythm irregular.     Pulses: Normal pulses.     Heart sounds: Normal heart sounds.     Comments: History of SVT ablation; ptca Pulmonary:     Effort: Pulmonary effort is  normal. No respiratory distress.     Breath sounds: Normal breath sounds.     Comments: 02 dependent  Abdominal:     General: Bowel sounds are normal. There is no distension.     Palpations: Abdomen is soft.     Tenderness: There is no abdominal tenderness.  Genitourinary:    Comments: Foley  Musculoskeletal: Normal range of motion.     Right lower leg: No edema.     Left lower leg: No edema.  Lymphadenopathy:     Cervical: No cervical adenopathy.  Skin:    General: Skin is warm and dry.     Comments: Left heel: unstaged soft eschar: 7.4 x 6.8 cm Right heel unstaged: soft eschar: 3.5 x 3.0 cm Left buttock: unstaged: soft eschar: 6.5 x 5.8 x 2.2 cm  No signs of infection present      Neurological:     Mental Status: He is alert and oriented to person, place, and time.  Psychiatric:        Mood and Affect: Mood normal.      ASSESSMENT/ PLAN:  TODAY ;  1. Urinary retention is stable has foley; has failed 2 voiding trials; will continue flomax 0.4 mg daily and proscar 5 mg daily   2. GERD without esophagitis: is stable will continue zofran 4 mg every 6 hours as needed will monitor   3. Acute diastolic CHF (Congestive heart failure) EF 55-60 % (12-06-18) will continue demadex 20 mg twice daily with k+ 10 meq daily lopressor 25 mg twice daily and has ntg prn    PREVIOUS  4. Hypokalemia: is stable k+ 4.0 will continue k+ 10 meq daily   5. Generalized anxiety disorder: is stable will continue xanax 2 mg twice daily   6. 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 Atrial flutter/SVT (supraventricular tachycardia) s/p ablation heart rate stable will continue lopressor 25 mg twice daily and digoxin 0.125 mg daily for rate control eliquis 5 mg twice daily   8. Hyperlipidemia LDL  goal <70 is off lipitor due to recent hepatitis   9. Peripheral vascular disease/heredity and idiopathic peripheral neuropathy: is stable will continue elevil 100 mg nightly and neurontin 80 mg three times daily   10. Protein calorie malnutrition severe: is stable albumin 2.0 continue prostat twice daily and supplements as ordered will monitor   11. Primary open glaucoma of both eyes unspecfied stage: will continue xalatan to both eyes  12. Unstaged pressure ulcer left buttock/decubitus ulcer bilateral heels unstagable: will continue current treatments and supplements.       MD is aware of resident's narcotic use and is in agreement with current plan of care. We will attempt to wean resident as appropriate.  Ok Edwards NP [facility]  Contact [phone] Monday through Friday 8am- 5pm  After hours call [phone]

## 2019-03-21 ENCOUNTER — Other Ambulatory Visit: Payer: Self-pay | Admitting: Adult Health

## 2019-03-21 MED ORDER — ALPRAZOLAM 2 MG PO TABS: 2.0000 mg | ORAL_TABLET | Freq: Two times a day (BID) | ORAL | 0 refills | Status: DC

## 2019-03-22 ENCOUNTER — Non-Acute Institutional Stay (SKILLED_NURSING_FACILITY): Payer: Medicare Other | Admitting: Adult Health

## 2019-03-22 ENCOUNTER — Encounter: Payer: Self-pay | Admitting: Adult Health

## 2019-03-22 DIAGNOSIS — L89610 Pressure ulcer of right heel, unstageable: Secondary | ICD-10-CM

## 2019-03-22 DIAGNOSIS — L89154 Pressure ulcer of sacral region, stage 4: Secondary | ICD-10-CM | POA: Diagnosis not present

## 2019-03-22 DIAGNOSIS — L89620 Pressure ulcer of left heel, unstageable: Secondary | ICD-10-CM | POA: Diagnosis not present

## 2019-03-22 NOTE — Progress Notes (Signed)
Location:    [facility] Room Number: 102/P Place of Service:  SNF (31)   CODE STATUS: Full Code  Allergies  Allergen Reactions  . Codeine Itching  . Trileptal [Oxcarbazepine]     Other reaction(s): Other (See Comments) Abnormal sodium levels Hyponatremia    Chief Complaint  Patient presents with  . 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 There are no reports of pain present. His left heel is boggy. There are no reports of fevers present.   Past Medical History:  Diagnosis Date  . AAA (abdominal aortic aneurysm), stable 04/20/2014  . Arthritis   . 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 CAD (coronary artery disease)    a. s/p cath in 2015 showing occluded RCA and high-grade LCx stenosis treated with DESx2 to LCx  . Cancer ([place])   . Depression   . Diverticulitis    hospital 2011 [facility]  . Diverticulosis   . GERD (gastroesophageal reflux disease)   . 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 Hernia of unspecified site of abdominal cavity without mention of obstruction or gangrene    hiatal  . Hyperlipidemia LDL goal <70 04/20/2014  . Hypertension   . IBS (irritable bowel syndrome)   . Myocardial infarction ([place]) 01/2014   NSTEMI  . Peripheral vascular disease ([facility])    ankle brachial index of 0.79 on the right and 0.61 on the left.   . Pneumonia   . 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 Reflex sympathetic dystrophy   . 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 SOB (shortness of breath)     Past Surgical History:  Procedure Laterality Date  . CARDIAC CATHETERIZATION    . CATARACT EXTRACTION    . CORONARY ANGIOPLASTY    . HERNIA REPAIR    . LEFT HEART CATHETERIZATION WITH CORONARY ANGIOGRAM N/A 01/17/2014   Procedure: LEFT HEART CATHETERIZATION WITH CORONARY ANGIOGRAM;  Surgeon: Leonie Man, MD;  Location: [facility] CATH LAB;  Service: Cardiovascular;  Laterality: N/A;  . left shoulder      x 3  .  NISSEN FUNDOPLICATION    . right elbow surgery     x 2  . right knee arthroscopy    . squamous cell skin cancer     Left Hand  . SUPRAVENTRICULAR TACHYCARDIA ABLATION N/A 01/16/2014   Procedure: SUPRAVENTRICULAR TACHYCARDIA ABLATION;  Surgeon: Evans Lance, MD;  Location: [facility] CATH LAB;  Service: Cardiovascular;  Laterality: N/A;    Social History   Socioeconomic History  . Marital status: Widowed    Spouse name: Not on file  . Number of children: 2  . Years of education: 46  . Highest education level: Not on file  Occupational History  . Occupation: [employer]  . Financial resource strain: Not on file  . Food insecurity    Worry: Not on file    Inability: Not on file  . Transportation needs    Medical: Not on file    Non-medical: Not on file  Tobacco Use  . Smoking status: Former Smoker    Packs/day: 2.50    Years: 50.00    Pack years: 125.00    Types: Cigarettes    Quit date: 05/05/2005    Years since quitting: 13.8  . Smokeless tobacco: Former Systems developer    Quit date: 05/03/2012  Substance and Sexual Activity  . Alcohol use: No  Alcohol/week: 0.0 standard drinks    Comment: last use 2013  . Drug use: No  . Sexual activity: Not on file    Comment: widowed, Veteran  Lifestyle  . Physical activity    Days per week: Not on file    Minutes per session: Not on file  . Stress: Not on file  Relationships  . Social Herbalist on phone: Not on file    Gets together: Not on file    Attends religious service: Not on file    Active member of club or organization: Not on file    Attends meetings of clubs or organizations: Not on file    Relationship status: Not on file  . Intimate partner violence    Fear of current or ex partner: Not on file    Emotionally abused: Not on file    Physically abused: Not on file    Forced sexual activity: Not on file  Other Topics Concern  . Not on file  Social History Narrative   Patient drinks 3 cups of caffeine  daily.   Patient is right handed.   Family History  Problem Relation Age of Onset  . Colon cancer Mother   . Colon cancer Maternal Aunt   . Colon cancer Maternal Uncle   . Heart attack Neg Hx   . Stroke Neg Hx       VITAL SIGNS BP 130/72   Pulse 62   Temp (!) 96.9 F (36.1 C) (Oral)   Resp 19   Ht 6' (1.829 m)   Wt 180 lb 6.4 oz (81.8 kg)   SpO2 90%   BMI 24.47 kg/m   Outpatient Encounter Medications as of 03/22/2019  Medication Sig  . acetaminophen (TYLENOL) 650 MG CR tablet Take 650 mg by mouth 3 (three) times daily.  [name] alprazolam (XANAX) 2 MG tablet Take 1 tablet (2 mg total) by mouth 2 (two) times daily.  . Amino Acids-Protein Hydrolys (FEEDING SUPPLEMENT, PRO-STAT SUGAR FREE 64,) LIQD Take 30 mLs by mouth 2 (two) times daily between meals.   [name] amitriptyline (ELAVIL) 100 MG tablet Take 100 mg by mouth at bedtime.  [name] apixaban (ELIQUIS) 2.5 MG TABS tablet Take 5 mg by mouth 2 (two) times daily.   Roseanne Kaufman [place]-Castor Oil (VENELEX) OINT Apply 1 application topically See admin instructions. Apply to coccyx, sacrum, bilateral buttocks each shift and prn  . bisacodyl (DULCOLAX) 5 MG EC tablet Take 5 mg by mouth daily as needed for mild constipation or moderate constipation.  . budesonide-formoterol (SYMBICORT) 160-4.5 MCG/ACT inhaler Inhale 2 puffs into the lungs 2 (two) times daily.  . camphor-menthol (ANTI-ITCH) lotion Apply 1 application topically 2 (two) times daily as needed for itching. Sparingly  . clotrimazole (LOTRIMIN) 1 % cream Instructions: apply to buttock rash three times daily Three Times A Day 09:00 AM, 02:00 PM, 09:00 PM  . collagenase (SANTYL) ointment Apply 1 application topically daily. Apply to left buttock wound per treatment orders  . collagenase (SANTYL) ointment Instructions: Apply to left heel per treatment orders.  . digoxin (LANOXIN) 0.125 MG tablet Take 0.125 mg by mouth daily.  [name] docusate sodium (COLACE) 100 MG capsule Take 100 mg by mouth daily  as needed for mild constipation.  . finasteride (PROSCAR) 5 MG tablet Take 5 mg by mouth every morning.   . gabapentin (NEURONTIN) 800 MG tablet Take 1 tablet (800 mg total) by mouth 3 (three) times daily.  [name] guaiFENesin (MUCINEX) 600 MG  12 hr tablet Take 600 mg by mouth 2 (two) times daily.   [name] ipratropium-albuterol (DUONEB) 0.5-2.5 (3) MG/3ML SOLN Take 3 mLs by nebulization every 4 (four) hours as needed (For wheezing and worsening shortness of breath or respiratory distress).  [name] latanoprost (XALATAN) 0.005 % ophthalmic solution Place 1 drop into both eyes at bedtime.  . metoprolol tartrate (LOPRESSOR) 25 MG tablet Take 50 mg by mouth 2 (two) times daily.   . Multiple Vitamins-Minerals (MULTIVITAMIN WITH MINERALS) tablet Take 1 tablet by mouth daily.  . nitroGLYCERIN (NITROSTAT) 0.4 MG SL tablet Place 1 tablet (0.4 mg total) under the tongue every 5 (five) minutes x 3 doses as needed for chest pain.  . NON FORMULARY Diet: Regular, NAS, Consistent Carbohydrate  . ondansetron (ZOFRAN) 4 MG tablet Take 4 mg by mouth every 6 (six) hours as needed for nausea or vomiting.   . OXYGEN Place 4 L/min into the nose continuous.   [name] spironolactone (ALDACTONE) 25 MG tablet Take 25 mg by mouth daily.  . tamsulosin (FLOMAX) 0.4 MG CAPS capsule Take 0.4 mg by mouth at bedtime.  . torsemide (DEMADEX) 20 MG tablet Take 20 mg by mouth 2 (two) times daily between meals.   . [DISCONTINUED] lamoTRIgine (LAMICTAL) 100 MG tablet Take 1 tablet (100 mg total) by mouth 2 (two) times daily.   No facility-administered encounter medications on file as of 03/22/2019.      SIGNIFICANT DIAGNOSTIC EXAMS   PREVIOUS;   01-01-19: chest x-ray:  1. Cardiomegaly with vascular congestion. 2. Small bilateral pleural effusions and bibasilar densities similar to prior radiograph  01-02-19: bilateral lower extremity venous doppler:  1. No evidence of deep venous thrombosis in either lower extremity. 2. Increased pulsatility of  the venous waveforms suggests elevated right heart pressures. Findings can be seen in tricuspid regurgitation, right heart failure, pulmonary hypertension and COPD.  01-02-19: 2-d echo:   1. The left ventricle has normal systolic function, with an ejection fraction of 55-60%. The cavity size was normal. There is mildly increased left ventricular wall thickness. Left ventricular diastolic Doppler parameters are indeterminate.  2. The right ventricle has normal systolic function. The cavity was normal. There is no increase in right ventricular wall thickness. Right ventricular systolic pressure could not be assessed.  3. No evidence of mitral valve stenosis.  4. The aortic valve is tricuspid. Mild thickening of the aortic valve. Mild calcification of the aortic valve. No stenosis of the aortic valve.  5. The aorta is normal unless otherwise noted.  6. The ascending aorta is normal in size and structure.  01-03-19: chest x-ray: Stable cardiomegaly, pulmonary vascular congestion, and bilateral pleural-parenchymal scarring. No acute findings.  01-28-19: chest x-ray: Similar appearance of reticulonodular opacities in the mid and lower lungs, which has progressed slowly from 2016 on chest x-ray exams. While the changes most likely represent chronic interstitial fibrosis/scarring, superimposed infection cannot be excluded radiographically   02-04-19: chest x-ray: Extensive bilateral calcified pleural plaques. Chronic increased markings throughout the lungs, favor scarring. No definite acute process   02-08-19: chest x-ray; Unchanged reticulonodular opacities throughout both lungs, likely due to chronic lung changes, however cannot exclude superimposed infectious etiology  02-08-19: ct of abdomen and pelvis;  1.  No acute intra-abdominal process. 2. New small ascites. 3. Unchanged 3.2 cm infrarenal abdominal aortic aneurysm. Recommend followup by ultrasound in 3 years. T . Aortic aneurysm  4.  Aortic  atherosclerosis   02-09-19: right upper quad ultrasound: Gallbladder distention is noted without cholelithiasis.  No gallbladder wall thickening or pericholecystic fluid is noted. No other abnormality seen in the right upper quadrant of the abdomen.  02-11-19: ct angio of chest:  1.  Negative examination for pulmonary embolism. 2. There are large, bilateral calcified pleural plaques throughout. Small bilateral pleural effusions, generally loculated and chronic appearing. 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 3. Bulky mediastinal and hilar lymphadenopathy, similar to prior examination dated 04/24/2014, largest right paratracheal lymph nodes measuring 2.4 x 2.4 cm (series 4, image 25), of uncertain significance given long-term stability, perhaps reactive to extensive pleural disease. 4.  Cardiomegaly and coronary artery disease. 5. Enlargement of the main pulmonary artery, as can be seen in pulmonary hypertension. 6.  Ascites in the included upper abdomen. 7.  Aortic Atherosclerosis  02-14-19: MRI of brain 1. Significant and progressive motion degradation. 2. Diffusion imaging is diagnostic and negative for acute infarct. 3. Senescent changes without significant progression since 2015.  02-23-19: chest x-ray: mild CHF bilateral pleural effusion  NO NEW EXAMS.     LABS REVIEWED PREVIOUS;   01-01-19: wbc 8.2; hgb 14.2; hct 51.1; mcv 103.4; plt 216; glucose 100; bun 46; creat 1.88; k+ 5.8; na++ 142; ca 8.7; liver normal albumin 3.1 01-02-19: wbc 5.3; hgb 145; hct 51.8; mcv 101.2; plt 219; glucose 132; bun 45; creat 1.52; k+ 4.7; na++ 143; ca 9.0 ;liver normal albumin 3.3; tsh 0.435 vit B 12; 296; urine culture: staphylococcus species 01-05-19: glucose 129; bun 37; creat 0.98; k+ 4.1; na++ 134; ca 8.5; mag 1.9 01-10-19: glucose 111; bun 38; creat 0.85; k+ 3.5; na++ 130; ca 8.7  01-28-19: wbc 8.6; hgb 11.0 hct 36.1; mcv 93.0 plt 277; glucose 125; bun 20; creat 1.08; k+ 4.5; na++ 134;  ca 8.9 blood and urine cultures: no growth. 02-04-19: wbc 18.0; hgb 11.0; hct 36.3; mcv 92.4; plt 545 02-08-19: wbc 27.4; hgb 10.4; hct 34.0; mcv 93.7; plt 478; glucose 201; bun 36; creat 2.31; k+ 4.8; na++ 131; ca 8.7; ast 1485.0; alt 846.0; albumin 2.1; amalse 16; lipase 22; direct bili 0.1 d-dimer: 4.15  c-diff: neg  Hepatitis panel: neg 02-09-19: wbc 16.2; hgb 9.7; hct 32.7; mcv 95.1; plt 347; glucose 106; bun 37; creat 2.14; k+ 4.0; na++ 133; ca 8.1; ast 651; alt 801; albumin 2.0; tsh 2.140; INR 2.4 02-12-19: wbc 14.8; hgb 9.9; hct 33.3; mcv 95.4; plt 324; glucose 96; bun 24; creat 0.96; k+ 3.9; na++ 136; ca 8.9; ast 77 alt 272; albumin 2.1 02-14-19: wbc 12.5; hgb 9.8; hct 32.9; mcv 97.1 plt 312; glucose 114; bun 25; creat 0.95; k+ 4.1; na++ 138; ca 9.2; ast 18; alt 134; albumin 2.0 02-15-19: INR 1.6; d-dimer: 3.24 02-22-19: glucose 100; bun 13; creat 1.01; k+ 4.0; na++ 140; ca 8.8  02-24-19: glucose 98; bun 18 ;creat 1.10; k+ 4.5; an++ 136; ca 9.2 mag 1.7  NO NEW LABS.      Review of Systems  Constitutional: Negative for malaise/fatigue.  Respiratory: Negative for cough and shortness of breath.   Cardiovascular: Negative for chest pain, palpitations and leg swelling.  Gastrointestinal: Negative for abdominal pain, constipation and heartburn.  Musculoskeletal: Negative for back pain, joint pain and myalgias.  Skin:       Has sores   Neurological: Negative for dizziness.  Psychiatric/Behavioral: The patient is not nervous/anxious.     Physical Exam Constitutional:      General: He is not in acute distress.    Appearance: He is well-developed. He is not diaphoretic.  Neck:  Thyroid: No thyromegaly.  Cardiovascular:     Rate and Rhythm: Normal rate. Rhythm irregular.     Heart sounds: Normal heart sounds.     Comments:  History of SVT ablation; ptca Pulmonary:     Effort: Pulmonary effort is normal. No respiratory distress.     Breath sounds: Normal breath sounds.      Comments: 02 dependent  Abdominal:     General: Bowel sounds are normal. There is no distension.     Palpations: Abdomen is soft.     Tenderness: There is no abdominal tenderness.  Genitourinary:    Comments: Foley  Musculoskeletal: Normal range of motion.     Right lower leg: No edema.     Left lower leg: No edema.  Lymphadenopathy:     Cervical: No cervical adenopathy.  Skin:    General: Skin is warm and dry.     Comments: Right heel: 3.5 x 3.0  100 % eschar Left heel 7.0 x 6.5 x 0.1 cm boggy eschar periwound erythema  Left buttock: 5.0 x 4.0 x 2.0 80% slough 20% granulation   Neurological:     Mental Status: He is alert and oriented to person, place, and time.  Psychiatric:        Mood and Affect: Mood normal.         ASSESSMENT/ PLAN:  TODAY;   1. Sacral decubitus ulcer stage !V 2. Decubitus ulcer bilateral heels unstagable  Will continue current treatments at this time and will continue to monitor his status.      MD is aware of resident's narcotic use and is in agreement with current plan of care. We will attempt to wean resident as appropriate.  Ok Edwards NP [facility]  Contact [phone] Monday through Friday 8am- 5pm  After hours call [phone]

## 2019-03-24 ENCOUNTER — Encounter: Payer: Self-pay | Admitting: Adult Health

## 2019-03-24 ENCOUNTER — Non-Acute Institutional Stay (SKILLED_NURSING_FACILITY): Payer: Medicare Other | Admitting: Adult Health

## 2019-03-24 DIAGNOSIS — I5032 Chronic diastolic (congestive) heart failure: Secondary | ICD-10-CM

## 2019-03-24 DIAGNOSIS — I779 Disorder of arteries and arterioles, unspecified: Secondary | ICD-10-CM | POA: Diagnosis not present

## 2019-03-24 DIAGNOSIS — J449 Chronic obstructive pulmonary disease, unspecified: Secondary | ICD-10-CM

## 2019-03-24 NOTE — Progress Notes (Signed)
Location:  [place] Room Number: 102/P Place of Service:  SNF (31) Provider: Ok Edwards   Patient Care Team: [name], MD as PCP - General (Family Medicine) Herminio Commons, MD as PCP - Cardiology (Cardiology)  Extended Emergency Contact Information Primary Emergency Contact: [facility] Address: [place], [place] [address] [place] of [place] Phone: [phone] Mobile Phone: [phone] Relation: Other Secondary Emergency Contact: Crutcher,Mark Address: [address]          [place] Phone: [phone] Mobile Phone: [phone] Relation: Son  Code Status: full code  Goals of Care: Advanced Directive information Advanced Directives 03/24/2019  Does Patient Have a Medical Advance Directive? Yes  Type of Advance Directive (No Data)  Does patient want to make changes to medical advance directive? No - Patient declined  Copy of [place] in Chart? -  Would patient like information on creating a medical advance directive? -     Chief Complaint  Patient presents with  . Medicare Wellness    Annual Wellness Visit    HPI: Patient is a 73 y.o. male seen in today for an annual wellness exam.  He has been hospitalized several times over the past year for heart and lung disease and acute hepatitis. No reports of falls. His weight has been stable. He denies any uncontrolled pain; his anxiety is managed; his appetite is good. He continues to be followed for his chronic illnesses including: carotid artery disease; chf; copd.   Depression screen PHQ 2/9 03/24/2019  Decreased Interest 1  Down, Depressed, Hopeless 1  PHQ - 2 Score 2  Altered sleeping 0  Tired, decreased energy 1  Change in appetite 0  Feeling bad or failure about yourself  0  Trouble concentrating 0  Moving slowly or fidgety/restless 0  Suicidal thoughts 0  PHQ-9 Score 3   Difficult doing work/chores (No Data)    Fall Risk  03/24/2019 10/01/2017  Falls in the past year? 0 Yes  Number falls in past yr: - 1  Injury with Fall? - No  Risk for fall due to : - Impaired mobility     Health Maintenance  Topic Date Due  . PNA vac Low Risk Adult (1 of 2 - PCV13) 07/02/2009  . COLONOSCOPY  07/23/2015  . TETANUS/TDAP  01/24/2029  . INFLUENZA VACCINE  Completed  . Hepatitis C Screening  Completed     Functional Status Survey: Is the patient deaf or have difficulty hearing?: No Does the patient have difficulty seeing, even when wearing glasses/contacts?: No Does the patient have difficulty concentrating, remembering, or making decisions?: No Does the patient have difficulty walking or climbing stairs?: (long term resident of SNF) Does the patient have difficulty dressing or bathing?: Yes   Past Medical History:  Diagnosis Date  . AAA (abdominal aortic aneurysm), stable 04/20/2014  . Arthritis   . 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 CAD (coronary artery disease)    a. s/p cath in 2015 showing occluded RCA and high-grade LCx stenosis treated with DESx2 to LCx  . Cancer ([place])   . Depression   . Diverticulitis    hospital 2011 [facility]  . Diverticulosis   . GERD (gastroesophageal reflux disease)   . 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 Hernia of unspecified site of abdominal cavity without mention of obstruction or gangrene    hiatal  .  Hyperlipidemia LDL goal <70 04/20/2014  . Hypertension   . IBS (irritable bowel syndrome)   . Myocardial infarction ([place]) 01/2014   NSTEMI  . Peripheral vascular disease ([facility])    ankle brachial index of 0.79 on the right and 0.61 on the left.   . Pneumonia   . 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 Reflex sympathetic dystrophy   . 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 SOB (shortness of breath)     Past Surgical History:  Procedure Laterality Date  . CARDIAC CATHETERIZATION    . CATARACT EXTRACTION    . CORONARY ANGIOPLASTY     . HERNIA REPAIR    . LEFT HEART CATHETERIZATION WITH CORONARY ANGIOGRAM N/A 01/17/2014   Procedure: LEFT HEART CATHETERIZATION WITH CORONARY ANGIOGRAM;  Surgeon: Leonie Man, MD;  Location: [facility] CATH LAB;  Service: Cardiovascular;  Laterality: N/A;  . left shoulder      x 3  . NISSEN FUNDOPLICATION    . right elbow surgery     x 2  . right knee arthroscopy    . squamous cell skin cancer     Left Hand  . SUPRAVENTRICULAR TACHYCARDIA ABLATION N/A 01/16/2014   Procedure: SUPRAVENTRICULAR TACHYCARDIA ABLATION;  Surgeon: Evans Lance, MD;  Location: [facility] CATH LAB;  Service: Cardiovascular;  Laterality: N/A;    Family History  Problem Relation Age of Onset  . Colon cancer Mother   . Colon cancer Maternal Aunt   . Colon cancer Maternal Uncle   . Heart attack Neg Hx   . Stroke Neg Hx    Social History   Socioeconomic History  . Marital status: Widowed    Spouse name: Not on file  . Number of children: 2  . Years of education: 73  . Highest education level: Not on file  Occupational History  . Occupation: [employer]  . Occupation: retired   Scientific laboratory technician  . Financial resource strain: Not hard at all  . Food insecurity    Worry: Never true    Inability: Never true  . Transportation needs    Medical: No    Non-medical: No  Tobacco Use  . Smoking status: Former Smoker    Packs/day: 2.50    Years: 50.00    Pack years: 125.00    Types: Cigarettes    Quit date: 05/05/2005    Years since quitting: 13.8  . Smokeless tobacco: Former Systems developer    Quit date: 05/03/2012  Substance and Sexual Activity  . Alcohol use: No    Alcohol/week: 0.0 standard drinks    Comment: last use 2013  . Drug use: No  . Sexual activity: Not Currently    Comment: widowed, Veteran  Lifestyle  . Physical activity    Days per week: 0 days    Minutes per session: 0 min  . Stress: Not at all  Relationships  . Social Herbalist on phone: Once a week    Gets together: Never    Attends religious  service: Never    Active member of club or organization: No    Attends meetings of clubs or organizations: Not on file    Relationship status: Married  . Intimate partner violence    Fear of current or ex partner: No    Emotionally abused: No    Physically abused: No    Forced sexual activity: No  Other Topics Concern  . Not on file  Social History Narrative   Long term  resident of SNF      reports that he quit smoking about 13 years ago. His smoking use included cigarettes. He has a 125.00 pack-year smoking history. He quit smokeless tobacco use about 6 years ago. He reports that he does not drink alcohol or use drugs.   Allergies  Allergen Reactions  . Codeine Itching  . Trileptal [Oxcarbazepine]     Other reaction(s): Other (See Comments) Abnormal sodium levels Hyponatremia    Outpatient Encounter Medications as of 03/24/2019  Medication Sig  . acetaminophen (TYLENOL) 650 MG CR tablet Take 650 mg by mouth 3 (three) times daily.  [name] alprazolam (XANAX) 2 MG tablet Take 1 tablet (2 mg total) by mouth 2 (two) times daily.  . Amino Acids-Protein Hydrolys (FEEDING SUPPLEMENT, PRO-STAT SUGAR FREE 64,) LIQD Take 30 mLs by mouth 2 (two) times daily between meals.   [name] amitriptyline (ELAVIL) 100 MG tablet Take 100 mg by mouth at bedtime.  [name] apixaban (ELIQUIS) 2.5 MG TABS tablet Take 5 mg by mouth 2 (two) times daily.   Roseanne Kaufman Peru-Castor Oil (VENELEX) OINT Apply 1 application topically See admin instructions. Apply to coccyx, sacrum, bilateral buttocks each shift and prn  . bisacodyl (DULCOLAX) 5 MG EC tablet Take 5 mg by mouth daily as needed for mild constipation or moderate constipation.  . budesonide-formoterol (SYMBICORT) 160-4.5 MCG/ACT inhaler Inhale 2 puffs into the lungs 2 (two) times daily.  . camphor-menthol (ANTI-ITCH) lotion Apply 1 application topically 2 (two) times daily as needed for itching. Sparingly  . clotrimazole (LOTRIMIN) 1 % cream Instructions: apply to buttock  rash three times daily Three Times A Day 09:00 AM, 02:00 PM, 09:00 PM  . collagenase (SANTYL) ointment Apply 1 application topically daily. Apply to left buttock wound per treatment orders  . collagenase (SANTYL) ointment Instructions: Apply to left heel per treatment orders.  . digoxin (LANOXIN) 0.125 MG tablet Take 0.125 mg by mouth daily.  [name] docusate sodium (COLACE) 100 MG capsule Take 100 mg by mouth daily as needed for mild constipation.  . finasteride (PROSCAR) 5 MG tablet Take 5 mg by mouth every morning.   . gabapentin (NEURONTIN) 800 MG tablet Take 1 tablet (800 mg total) by mouth 3 (three) times daily.  [name] guaiFENesin (MUCINEX) 600 MG 12 hr tablet Take 600 mg by mouth 2 (two) times daily.   [name] ipratropium-albuterol (DUONEB) 0.5-2.5 (3) MG/3ML SOLN Take 3 mLs by nebulization every 4 (four) hours as needed (For wheezing and worsening shortness of breath or respiratory distress).  [name] latanoprost (XALATAN) 0.005 % ophthalmic solution Place 1 drop into both eyes at bedtime.  . metoprolol tartrate (LOPRESSOR) 25 MG tablet Take 50 mg by mouth 2 (two) times daily.   . Multiple Vitamins-Minerals (MULTIVITAMIN WITH MINERALS) tablet Take 1 tablet by mouth daily.  . nitroGLYCERIN (NITROSTAT) 0.4 MG SL tablet Place 1 tablet (0.4 mg total) under the tongue every 5 (five) minutes x 3 doses as needed for chest pain.  . NON FORMULARY Diet: Regular, NAS, Consistent Carbohydrate  . ondansetron (ZOFRAN) 4 MG tablet Take 4 mg by mouth every 6 (six) hours as needed for nausea or vomiting.   . OXYGEN Place 4 L/min into the nose continuous.   [name] spironolactone (ALDACTONE) 25 MG tablet Take 25 mg by mouth daily.  . tamsulosin (FLOMAX) 0.4 MG CAPS capsule Take 0.4 mg by mouth at bedtime.  . torsemide (DEMADEX) 20 MG tablet Take 20 mg by mouth 2 (two) times daily between meals.   . [  DISCONTINUED] lamoTRIgine (LAMICTAL) 100 MG tablet Take 1 tablet (100 mg total) by mouth 2 (two) times daily.   No  facility-administered encounter medications on file as of 03/24/2019.      Review of Systems:  Review of Systems  Constitutional: Negative for appetite change and fatigue.  HENT: Negative for congestion.   Respiratory: Negative for cough, chest tightness and shortness of breath.   Cardiovascular: Negative for chest pain, palpitations and leg swelling.  Gastrointestinal: Negative for abdominal pain, constipation, diarrhea and nausea.  Genitourinary:       Has foley   Musculoskeletal: Negative for arthralgias and myalgias.  Skin: Negative for pallor.  Neurological: Negative for dizziness.  Psychiatric/Behavioral: The patient is not nervous/anxious.     Physical Exam: Vitals:   03/24/19 1129  BP: 130/72  Pulse: 62  Resp: 19  Temp: (!) 96.9 F (36.1 C)  TempSrc: Oral  SpO2: 95%  Weight: 182 lb 9.6 oz (82.8 kg)  Height: 6' (1.829 m)   Body mass index is 24.77 kg/m. Physical Exam Constitutional:      General: He is not in acute distress.    Appearance: He is well-developed. He is not diaphoretic.  Neck:     Musculoskeletal: Neck supple.     Thyroid: No thyromegaly.  Cardiovascular:     Rate and Rhythm: Normal rate. Rhythm irregular.     Pulses: Normal pulses.     Heart sounds: Normal heart sounds.     Comments: History of SVT ablation; ptca Pulmonary:     Effort: Pulmonary effort is normal. No respiratory distress.     Breath sounds: Normal breath sounds.     Comments: 02 dependent  Abdominal:     General: Bowel sounds are normal. There is no distension.     Palpations: Abdomen is soft.     Tenderness: There is no abdominal tenderness.  Genitourinary:    Comments: Foley  Musculoskeletal: Normal range of motion.     Right lower leg: No edema.     Left lower leg: No edema.  Lymphadenopathy:     Cervical: No cervical adenopathy.  Skin:    General: Skin is warm and dry.     Comments: Right heel: 3.5 x 3.0  100 % eschar Left heel 7.0 x 6.5 x 0.1 cm boggy eschar  periwound erythema  Left buttock: 5.0 x 4.0 x 2.0 80% slough 20%   Neurological:     Mental Status: He is alert and oriented to person, place, and time.  Psychiatric:        Mood and Affect: Mood normal.      SIGNIFICANT DIAGNOSTIC EXAMS   PREVIOUS;   01-01-19: chest x-ray:  1. Cardiomegaly with vascular congestion. 2. Small bilateral pleural effusions and bibasilar densities similar to prior radiograph  01-02-19: bilateral lower extremity venous doppler:  1. No evidence of deep venous thrombosis in either lower extremity. 2. Increased pulsatility of the venous waveforms suggests elevated right heart pressures. Findings can be seen in tricuspid regurgitation, right heart failure, pulmonary hypertension and COPD.  01-02-19: 2-d echo:   1. The left ventricle has normal systolic function, with an ejection fraction of 55-60%. The cavity size was normal. There is mildly increased left ventricular wall thickness. Left ventricular diastolic Doppler parameters are indeterminate.  2. The right ventricle has normal systolic function. The cavity was normal. There is no increase in right ventricular wall thickness. Right ventricular systolic pressure could not be assessed.  3. No evidence of mitral valve stenosis.  4. The aortic valve is tricuspid. Mild thickening of the aortic valve. Mild calcification of the aortic valve. No stenosis of the aortic valve.  5. The aorta is normal unless otherwise noted.  6. The ascending aorta is normal in size and structure.  01-03-19: chest x-ray: Stable cardiomegaly, pulmonary vascular congestion, and bilateral pleural-parenchymal scarring. No acute findings.  01-28-19: chest x-ray: Similar appearance of reticulonodular opacities in the mid and lower lungs, which has progressed slowly from 2016 on chest x-ray exams. While the changes most likely represent chronic interstitial fibrosis/scarring, superimposed infection cannot be excluded radiographically    02-04-19: chest x-ray: Extensive bilateral calcified pleural plaques. Chronic increased markings throughout the lungs, favor scarring. No definite acute process   02-08-19: chest x-ray; Unchanged reticulonodular opacities throughout both lungs, likely due to chronic lung changes, however cannot exclude superimposed infectious etiology  02-08-19: ct of abdomen and pelvis;  1.  No acute intra-abdominal process. 2. New small ascites. 3. Unchanged 3.2 cm infrarenal abdominal aortic aneurysm. Recommend followup by ultrasound in 3 years. T . Aortic aneurysm  4.  Aortic atherosclerosis   02-09-19: right upper quad ultrasound: Gallbladder distention is noted without cholelithiasis. No gallbladder wall thickening or pericholecystic fluid is noted. No other abnormality seen in the right upper quadrant of the abdomen.  02-11-19: ct angio of chest:  1.  Negative examination for pulmonary embolism. 2. There are large, bilateral calcified pleural plaques throughout. Small bilateral pleural effusions, generally loculated and chronic appearing. 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 3. Bulky mediastinal and hilar lymphadenopathy, similar to prior examination dated 04/24/2014, largest right paratracheal lymph nodes measuring 2.4 x 2.4 cm (series 4, image 25), of uncertain significance given long-term stability, perhaps reactive to extensive pleural disease. 4.  Cardiomegaly and coronary artery disease. 5. Enlargement of the main pulmonary artery, as can be seen in pulmonary hypertension. 6.  Ascites in the included upper abdomen. 7.  Aortic Atherosclerosis  02-14-19: MRI of brain 1. Significant and progressive motion degradation. 2. Diffusion imaging is diagnostic and negative for acute infarct. 3. Senescent changes without significant progression since 2015.  02-23-19: chest x-ray: mild CHF bilateral pleural effusion  NO NEW EXAMS.     LABS REVIEWED PREVIOUS;   01-01-19: wbc 8.2; hgb  14.2; hct 51.1; mcv 103.4; plt 216; glucose 100; bun 46; creat 1.88; k+ 5.8; na++ 142; ca 8.7; liver normal albumin 3.1 01-02-19: wbc 5.3; hgb 145; hct 51.8; mcv 101.2; plt 219; glucose 132; bun 45; creat 1.52; k+ 4.7; na++ 143; ca 9.0 ;liver normal albumin 3.3; tsh 0.435 vit B 12; 296; urine culture: staphylococcus species 01-05-19: glucose 129; bun 37; creat 0.98; k+ 4.1; na++ 134; ca 8.5; mag 1.9 01-10-19: glucose 111; bun 38; creat 0.85; k+ 3.5; na++ 130; ca 8.7  01-28-19: wbc 8.6; hgb 11.0 hct 36.1; mcv 93.0 plt 277; glucose 125; bun 20; creat 1.08; k+ 4.5; na++ 134; ca 8.9 blood and urine cultures: no growth. 02-04-19: wbc 18.0; hgb 11.0; hct 36.3; mcv 92.4; plt 545 02-08-19: wbc 27.4; hgb 10.4; hct 34.0; mcv 93.7; plt 478; glucose 201; bun 36; creat 2.31; k+ 4.8; na++ 131; ca 8.7; ast 1485.0; alt 846.0; albumin 2.1; amalse 16; lipase 22; direct bili 0.1 d-dimer: 4.15  c-diff: neg  Hepatitis panel: neg 02-09-19: wbc 16.2; hgb 9.7; hct 32.7; mcv 95.1; plt 347; glucose 106; bun 37; creat 2.14; k+ 4.0; na++ 133; ca 8.1; ast 651; alt 801; albumin 2.0; tsh 2.140; INR 2.4 02-12-19: wbc 14.8; hgb  9.9; hct 33.3; mcv 95.4; plt 324; glucose 96; bun 24; creat 0.96; k+ 3.9; na++ 136; ca 8.9; ast 77 alt 272; albumin 2.1 02-14-19: wbc 12.5; hgb 9.8; hct 32.9; mcv 97.1 plt 312; glucose 114; bun 25; creat 0.95; k+ 4.1; na++ 138; ca 9.2; ast 18; alt 134; albumin 2.0 02-15-19: INR 1.6; d-dimer: 3.24 02-22-19: glucose 100; bun 13; creat 1.01; k+ 4.0; na++ 140; ca 8.8  02-24-19: glucose 98; bun 18 ;creat 1.10; k+ 4.5; an++ 136; ca 9.2 mag 1.7  NO NEW LABS.    Assessment/Plan   TODAY;   1. Chronic diastolic heart failure 2. COPD with chronic bronchitis 3. Bilateral carotid artery disease  Will continue current medications;  Will continue therapy as directed Will continue current plan of care Will continue to monitor his status.   Ok Edwards NP [facility]  Contact [phone] Monday through  Friday 8am- 5pm  After hours call [phone]

## 2019-03-28 ENCOUNTER — Non-Acute Institutional Stay (SKILLED_NURSING_FACILITY): Payer: Medicare Other | Admitting: Adult Health

## 2019-03-28 ENCOUNTER — Encounter: Payer: Self-pay | Admitting: Adult Health

## 2019-03-28 DIAGNOSIS — B179 Acute viral hepatitis, unspecified: Secondary | ICD-10-CM

## 2019-03-28 DIAGNOSIS — J449 Chronic obstructive pulmonary disease, unspecified: Secondary | ICD-10-CM

## 2019-03-28 DIAGNOSIS — J9611 Chronic respiratory failure with hypoxia: Secondary | ICD-10-CM

## 2019-03-28 DIAGNOSIS — I5032 Chronic diastolic (congestive) heart failure: Secondary | ICD-10-CM | POA: Diagnosis not present

## 2019-03-28 NOTE — Progress Notes (Signed)
Location:    [place] Room Number: 102/P Place of Service:  SNF (31)    CODE STATUS: Full Code  Allergies  Allergen Reactions  . Codeine Itching  . Trileptal [Oxcarbazepine]     Other reaction(s): Other (See Comments) Abnormal sodium levels Hyponatremia    Chief Complaint  Patient presents with  . Discharge Note    Discgarge Visit    HPI:  He is being discharged to home with home health for pt/ot/rn/cna/sw. He will need a standard wheelchair and an electric bed. He will need his prescriptions written and will need to follow up with his medical provider. He has had a complicated hospitalizations. For copd and acute hepatitis. He was admitted to this facility for short term rehab. He has 3 unstaged wounds both heels and sacrum. He is wanting to go home since he has completed therapy. His family is supportive and is agreeing with his decision.    Past Medical History:  Diagnosis Date  . AAA (abdominal aortic aneurysm), stable 04/20/2014  . Arthritis   . 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 CAD (coronary artery disease)    a. s/p cath in 2015 showing occluded RCA and high-grade LCx stenosis treated with DESx2 to LCx  . Cancer ([place])   . Depression   . Diverticulitis    hospital 2011 [facility] [employer]  . Diverticulosis   . GERD (gastroesophageal reflux disease)   . 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 Hernia of unspecified site of abdominal cavity without mention of obstruction or gangrene    hiatal  . Hyperlipidemia LDL goal <70 04/20/2014  . Hypertension   . IBS (irritable bowel syndrome)   . Myocardial infarction ([place]) 01/2014   NSTEMI  . Peripheral vascular disease (H[facility])    ankle brachial index of 0.79 on the right and 0.61 on the left.   . Pneumonia   . 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 Reflex sympathetic dystrophy   . 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 SOB (shortness of breath)     Past Surgical History:  Procedure Laterality Date  . CARDIAC  CATHETERIZATION    . CATARACT EXTRACTION    . CORONARY ANGIOPLASTY    . HERNIA REPAIR    . LEFT HEART CATHETERIZATION WITH CORONARY ANGIOGRAM N/A 01/17/2014   Procedure: LEFT HEART CATHETERIZATION WITH CORONARY ANGIOGRAM;  Surgeon: Leonie Man, MD;  Location: [facility] CATH LAB;  Service: Cardiovascular;  Laterality: N/A;  . left shoulder      x 3  . NISSEN FUNDOPLICATION    . right elbow surgery     x 2  . right knee arthroscopy    . squamous cell skin cancer     Left Hand  . SUPRAVENTRICULAR TACHYCARDIA ABLATION N/A 01/16/2014   Procedure: SUPRAVENTRICULAR TACHYCARDIA ABLATION;  Surgeon: Evans Lance, MD;  Location: [facility] CATH LAB;  Service: Cardiovascular;  Laterality: N/A;    Social History   Socioeconomic History  . Marital status: Widowed    Spouse name: Not on file  . Number of children: 2  . Years of education: 70  . Highest education level: Not on file  Occupational History  . Occupation: [employer]  . Occupation: retired   Scientific laboratory technician  . Financial resource strain: Not hard at all  . Food insecurity    Worry: Never true    Inability: Never true  . Transportation needs    Medical: No    Non-medical: No  Tobacco Use  .  Smoking status: Former Smoker    Packs/day: 2.50    Years: 50.00    Pack years: 125.00    Types: Cigarettes    Quit date: 05/05/2005    Years since quitting: 13.9  . Smokeless tobacco: Former Systems developer    Quit date: 05/03/2012  Substance and Sexual Activity  . Alcohol use: No    Alcohol/week: 0.0 standard drinks    Comment: last use 2013  . Drug use: No  . Sexual activity: Not Currently    Comment: widowed, Veteran  Lifestyle  . Physical activity    Days per week: 0 days    Minutes per session: 0 min  . Stress: Not at all  Relationships  . Social Herbalist on phone: Once a week    Gets together: Never    Attends religious service: Never    Active member of club or organization: No    Attends meetings of clubs or organizations: Not on  file    Relationship status: Married  . Intimate partner violence    Fear of current or ex partner: No    Emotionally abused: No    Physically abused: No    Forced sexual activity: No  Other Topics Concern  . Not on file  Social History Narrative   Long term resident of SNF    Family History  Problem Relation Age of Onset  . Colon cancer Mother   . Colon cancer Maternal Aunt   . Colon cancer Maternal Uncle   . Heart attack Neg Hx   . Stroke Neg Hx     VITAL SIGNS BP (!) 102/48   Pulse 63   Temp 97.9 F (36.6 C) (Oral)   Resp 18   Ht 6' (1.829 m)   Wt 182 lb 9.6 oz (82.8 kg)   SpO2 97%   BMI 24.77 kg/m   Patient's Medications  New Prescriptions   No medications on file  Previous Medications   ACETAMINOPHEN (TYLENOL) 650 MG CR TABLET    Take 650 mg by mouth 3 (three) times daily.   ALPRAZOLAM (XANAX) 2 MG TABLET    Take 1 tablet (2 mg total) by mouth 2 (two) times daily.   AMINO ACIDS-PROTEIN HYDROLYS (FEEDING SUPPLEMENT, PRO-STAT SUGAR FREE 64,) LIQD    Take 30 mLs by mouth 2 (two) times daily between meals.    AMITRIPTYLINE (ELAVIL) 100 MG TABLET    Take 100 mg by mouth at bedtime.   APIXABAN (ELIQUIS) 2.5 MG TABS TABLET    Take 5 mg by mouth 2 (two) times daily.    BALSAM PERU-CASTOR OIL (VENELEX) OINT    Apply 1 application topically See admin instructions. Apply to coccyx, sacrum, bilateral buttocks each shift and prn   BISACODYL (DULCOLAX) 5 MG EC TABLET    Take 5 mg by mouth daily as needed for mild constipation or moderate constipation.   BUDESONIDE-FORMOTEROL (SYMBICORT) 160-4.5 MCG/ACT INHALER    Inhale 2 puffs into the lungs 2 (two) times daily.   CAMPHOR-MENTHOL (ANTI-ITCH) LOTION    Apply 1 application topically 2 (two) times daily as needed for itching. Sparingly   CLOTRIMAZOLE (LOTRIMIN) 1 % CREAM    Instructions: apply to buttock rash three times daily Three Times A Day 09:00 AM, 02:00 PM, 09:00 PM   COLLAGENASE (SANTYL) OINTMENT    Apply 1 application  topically daily. Apply to left buttock wound per treatment orders   COLLAGENASE (SANTYL) OINTMENT    Instructions: Apply to left  heel per treatment orders.   DIGOXIN (LANOXIN) 0.125 MG TABLET    Take 0.125 mg by mouth daily.   DOCUSATE SODIUM (COLACE) 100 MG CAPSULE    Take 100 mg by mouth daily as needed for mild constipation.   FINASTERIDE (PROSCAR) 5 MG TABLET    Take 5 mg by mouth every morning.    GABAPENTIN (NEURONTIN) 800 MG TABLET    Take 1 tablet (800 mg total) by mouth 3 (three) times daily.   GUAIFENESIN (MUCINEX) 600 MG 12 HR TABLET    Take 600 mg by mouth 2 (two) times daily.    IPRATROPIUM-ALBUTEROL (DUONEB) 0.5-2.5 (3) MG/3ML SOLN    Take 3 mLs by nebulization every 4 (four) hours as needed (For wheezing and worsening shortness of breath or respiratory distress).   LATANOPROST (XALATAN) 0.005 % OPHTHALMIC SOLUTION    Place 1 drop into both eyes at bedtime.   METOPROLOL TARTRATE (LOPRESSOR) 25 MG TABLET    Take 50 mg by mouth 2 (two) times daily.    MULTIPLE VITAMINS-MINERALS (MULTIVITAMIN WITH MINERALS) TABLET    Take 1 tablet by mouth daily.   NITROGLYCERIN (NITROSTAT) 0.4 MG SL TABLET    Place 1 tablet (0.4 mg total) under the tongue every 5 (five) minutes x 3 doses as needed for chest pain.   NON FORMULARY    Diet: Regular, NAS, Consistent Carbohydrate   ONDANSETRON (ZOFRAN) 4 MG TABLET    Take 4 mg by mouth every 6 (six) hours as needed for nausea or vomiting.    OXYGEN    Place 4 L/min into the nose continuous.    SPIRONOLACTONE (ALDACTONE) 25 MG TABLET    Take 25 mg by mouth daily.   TAMSULOSIN (FLOMAX) 0.4 MG CAPS CAPSULE    Take 0.4 mg by mouth at bedtime.   TORSEMIDE (DEMADEX) 20 MG TABLET    Take 20 mg by mouth 2 (two) times daily between meals.   Modified Medications   No medications on file  Discontinued Medications   No medications on file     SIGNIFICANT DIAGNOSTIC EXAMS   PREVIOUS;   01-01-19: chest x-ray:  1. Cardiomegaly with vascular congestion. 2.  Small bilateral pleural effusions and bibasilar densities similar to prior radiograph  01-02-19: bilateral lower extremity venous doppler:  1. No evidence of deep venous thrombosis in either lower extremity. 2. Increased pulsatility of the venous waveforms suggests elevated right heart pressures. Findings can be seen in tricuspid regurgitation, right heart failure, pulmonary hypertension and COPD.  01-02-19: 2-d echo:   1. The left ventricle has normal systolic function, with an ejection fraction of 55-60%. The cavity size was normal. There is mildly increased left ventricular wall thickness. Left ventricular diastolic Doppler parameters are indeterminate.  2. The right ventricle has normal systolic function. The cavity was normal. There is no increase in right ventricular wall thickness. Right ventricular systolic pressure could not be assessed.  3. No evidence of mitral valve stenosis.  4. The aortic valve is tricuspid. Mild thickening of the aortic valve. Mild calcification of the aortic valve. No stenosis of the aortic valve.  5. The aorta is normal unless otherwise noted.  6. The ascending aorta is normal in size and structure.  01-03-19: chest x-ray: Stable cardiomegaly, pulmonary vascular congestion, and bilateral pleural-parenchymal scarring. No acute findings.  01-28-19: chest x-ray: Similar appearance of reticulonodular opacities in the mid and lower lungs, which has progressed slowly from 2016 on chest x-ray exams. While the changes most likely represent  chronic interstitial fibrosis/scarring, superimposed infection cannot be excluded radiographically   02-04-19: chest x-ray: Extensive bilateral calcified pleural plaques. Chronic increased markings throughout the lungs, favor scarring. No definite acute process   02-08-19: chest x-ray; Unchanged reticulonodular opacities throughout both lungs, likely due to chronic lung changes, however cannot exclude superimposed infectious etiology   02-08-19: ct of abdomen and pelvis;  1.  No acute intra-abdominal process. 2. New small ascites. 3. Unchanged 3.2 cm infrarenal abdominal aortic aneurysm. Recommend followup by ultrasound in 3 years. T . Aortic aneurysm  4.  Aortic atherosclerosis   02-09-19: right upper quad ultrasound: Gallbladder distention is noted without cholelithiasis. No gallbladder wall thickening or pericholecystic fluid is noted. No other abnormality seen in the right upper quadrant of the abdomen.  02-11-19: ct angio of chest:  1.  Negative examination for pulmonary embolism. 2. There are large, bilateral calcified pleural plaques throughout. Small bilateral pleural effusions, generally loculated and chronic appearing. 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 3. Bulky mediastinal and hilar lymphadenopathy, similar to prior examination dated 04/24/2014, largest right paratracheal lymph nodes measuring 2.4 x 2.4 cm (series 4, image 25), of uncertain significance given long-term stability, perhaps reactive to extensive pleural disease. 4.  Cardiomegaly and coronary artery disease. 5. Enlargement of the main pulmonary artery, as can be seen in pulmonary hypertension. 6.  Ascites in the included upper abdomen. 7.  Aortic Atherosclerosis  02-14-19: MRI of brain 1. Significant and progressive motion degradation. 2. Diffusion imaging is diagnostic and negative for acute infarct. 3. Senescent changes without significant progression since 2015.  02-23-19: chest x-ray: mild CHF bilateral pleural effusion  NO NEW EXAMS.     LABS REVIEWED PREVIOUS;   01-01-19: wbc 8.2; hgb 14.2; hct 51.1; mcv 103.4; plt 216; glucose 100; bun 46; creat 1.88; k+ 5.8; na++ 142; ca 8.7; liver normal albumin 3.1 01-02-19: wbc 5.3; hgb 145; hct 51.8; mcv 101.2; plt 219; glucose 132; bun 45; creat 1.52; k+ 4.7; na++ 143; ca 9.0 ;liver normal albumin 3.3; tsh 0.435 vit B 12; 296; urine culture: staphylococcus species 01-05-19: glucose  129; bun 37; creat 0.98; k+ 4.1; na++ 134; ca 8.5; mag 1.9 01-10-19: glucose 111; bun 38; creat 0.85; k+ 3.5; na++ 130; ca 8.7  01-28-19: wbc 8.6; hgb 11.0 hct 36.1; mcv 93.0 plt 277; glucose 125; bun 20; creat 1.08; k+ 4.5; na++ 134; ca 8.9 blood and urine cultures: no growth. 02-04-19: wbc 18.0; hgb 11.0; hct 36.3; mcv 92.4; plt 545 02-08-19: wbc 27.4; hgb 10.4; hct 34.0; mcv 93.7; plt 478; glucose 201; bun 36; creat 2.31; k+ 4.8; na++ 131; ca 8.7; ast 1485.0; alt 846.0; albumin 2.1; amalse 16; lipase 22; direct bili 0.1 d-dimer: 4.15  c-diff: neg  Hepatitis panel: neg 02-09-19: wbc 16.2; hgb 9.7; hct 32.7; mcv 95.1; plt 347; glucose 106; bun 37; creat 2.14; k+ 4.0; na++ 133; ca 8.1; ast 651; alt 801; albumin 2.0; tsh 2.140; INR 2.4 02-12-19: wbc 14.8; hgb 9.9; hct 33.3; mcv 95.4; plt 324; glucose 96; bun 24; creat 0.96; k+ 3.9; na++ 136; ca 8.9; ast 77 alt 272; albumin 2.1 02-14-19: wbc 12.5; hgb 9.8; hct 32.9; mcv 97.1 plt 312; glucose 114; bun 25; creat 0.95; k+ 4.1; na++ 138; ca 9.2; ast 18; alt 134; albumin 2.0 02-15-19: INR 1.6; d-dimer: 3.24 02-22-19: glucose 100; bun 13; creat 1.01; k+ 4.0; na++ 140; ca 8.8  02-24-19: glucose 98; bun 18 ;creat 1.10; k+ 4.5; an++ 136; ca 9.2 mag 1.7  NO NEW  LABS.    Review of Systems  Constitutional: Negative for malaise/fatigue.  Respiratory: Negative for cough and shortness of breath.   Cardiovascular: Negative for chest pain, palpitations and leg swelling.  Gastrointestinal: Negative for abdominal pain, constipation and heartburn.  Musculoskeletal: Negative for back pain, joint pain and myalgias.  Skin: Negative.   Neurological: Negative for dizziness.  Psychiatric/Behavioral: The patient is not nervous/anxious.     Physical Exam Constitutional:      General: He is not in acute distress.    Appearance: He is well-developed. He is not diaphoretic.  Neck:     Musculoskeletal: Neck supple.     Thyroid: No thyromegaly.  Cardiovascular:     Rate and  Rhythm: Normal rate. Rhythm irregular.     Pulses: Normal pulses.     Heart sounds: Normal heart sounds.     Comments: History of SVT ablation; ptca Pulmonary:     Effort: Pulmonary effort is normal. No respiratory distress.     Breath sounds: Normal breath sounds.     Comments: 02 dependent  Abdominal:     General: Bowel sounds are normal. There is no distension.     Palpations: Abdomen is soft.     Tenderness: There is no abdominal tenderness.  Genitourinary:    Comments: Foley  Musculoskeletal: Normal range of motion.  Lymphadenopathy:     Cervical: No cervical adenopathy.  Skin:    General: Skin is warm and dry.     Comments: Right heel: 3.5 x 3.0  100 % eschar Left heel 7.0 x 6.5 x 0.1 cm boggy eschar periwound erythema  Left buttock: 5.0 x 4.0 x 2.0 80% slough 20%   Neurological:     Mental Status: He is alert and oriented to person, place, and time.  Psychiatric:        Mood and Affect: Mood normal.        ASSESSMENT/ PLAN:   Patient is being discharged with the following home health services:  Pt/ot/rn/cna/sw; to evaluate and treat as indicated for gait balance strength adl training wound care adl care; community resources   Patient is being discharged with the following durable medical equipment:  Standard wheelchair to allow him to maintain his current level of independence with his adls which cannot be achieved with a walker. Semi-electric bed with pressure relieving air mattress due to his multiple unstagable decubiti on his left buttock and bilateral heels to reduce pressure and promote wound healing which cannot be achieved in a standard bed.    Patient has been advised to f/u with their PCP in 1-2 weeks to bring them up to date on their rehab stay.  Social services at facility was responsible for arranging this appointment.  Pt was provided with a 30 day supply of prescriptions for medications and refills must be obtained from their PCP.  For controlled  substances, a more limited supply may be provided adequate until PCP appointment only.   A 30 day supply of his prescription medications with #60 xanax 2 mg tabs have [facility] to [facility] [place]  Time spent with patient and family 40 minutes: dme; medications; home health.   Ok Edwards NP [facility]  Contact [phone] Monday through Friday 8am- 5pm  After hours call [phone]

## 2019-03-29 ENCOUNTER — Other Ambulatory Visit: Payer: Self-pay | Admitting: Adult Health

## 2019-03-29 ENCOUNTER — Encounter (HOSPITAL_COMMUNITY)
Admission: RE | Admit: 2019-03-29 | Discharge: 2019-03-29 | Disposition: A | Discharge status: Home / Self Care | Payer: Medicare Other | Source: Skilled Nursing Facility | Attending: Adult Health | Admitting: Adult Health

## 2019-03-29 DIAGNOSIS — J441 Chronic obstructive pulmonary disease with (acute) exacerbation: Secondary | ICD-10-CM | POA: Insufficient documentation

## 2019-03-29 DIAGNOSIS — E43 Unspecified severe protein-calorie malnutrition: Secondary | ICD-10-CM | POA: Insufficient documentation

## 2019-03-29 DIAGNOSIS — F411 Generalized anxiety disorder: Secondary | ICD-10-CM | POA: Insufficient documentation

## 2019-03-29 DIAGNOSIS — B179 Acute viral hepatitis, unspecified: Secondary | ICD-10-CM | POA: Insufficient documentation

## 2019-03-29 DIAGNOSIS — N179 Acute kidney failure, unspecified: Secondary | ICD-10-CM | POA: Insufficient documentation

## 2019-03-29 DIAGNOSIS — A403 Sepsis due to Streptococcus pneumoniae: Secondary | ICD-10-CM | POA: Insufficient documentation

## 2019-03-29 MED ORDER — APIXABAN 5 MG PO TABS: 5.0000 mg | ORAL_TABLET | Freq: Two times a day (BID) | ORAL | 0 refills | Status: DC

## 2019-03-29 MED ORDER — SPIRONOLACTONE 25 MG PO TABS: 25.0000 mg | ORAL_TABLET | Freq: Every day | ORAL | 0 refills | Status: DC

## 2019-03-29 MED ORDER — ONDANSETRON HCL 4 MG PO TABS: 4.0000 mg | ORAL_TABLET | Freq: Four times a day (QID) | ORAL | 0 refills | Status: DC | PRN

## 2019-03-29 MED ORDER — METOPROLOL TARTRATE 25 MG PO TABS: 50.0000 mg | ORAL_TABLET | Freq: Two times a day (BID) | ORAL | 0 refills | Status: DC

## 2019-03-29 MED ORDER — LATANOPROST 0.005 % OP SOLN: 1.0000 [drp] | Freq: Every day | OPHTHALMIC | 0 refills | Status: AC

## 2019-03-29 MED ORDER — DIGOXIN 125 MCG PO TABS: 0.1250 mg | ORAL_TABLET | Freq: Every day | ORAL | 0 refills | Status: DC

## 2019-03-29 MED ORDER — TAMSULOSIN HCL 0.4 MG PO CAPS: 0.4000 mg | ORAL_CAPSULE | Freq: Every day | ORAL | 0 refills | Status: AC

## 2019-03-29 MED ORDER — NITROGLYCERIN 0.4 MG SL SUBL: 0.4000 mg | SUBLINGUAL_TABLET | SUBLINGUAL | 0 refills | Status: AC | PRN

## 2019-03-29 MED ORDER — FINASTERIDE 5 MG PO TABS: 5.0000 mg | ORAL_TABLET | ORAL | 0 refills | Status: AC

## 2019-03-29 MED ORDER — BUDESONIDE-FORMOTEROL FUMARATE 160-4.5 MCG/ACT IN AERO: 2.0000 | INHALATION_SPRAY | Freq: Two times a day (BID) | RESPIRATORY_TRACT | 0 refills | Status: DC

## 2019-03-29 MED ORDER — SANTYL 250 UNIT/GM EX OINT: 1.0000 "application " | TOPICAL_OINTMENT | Freq: Every day | CUTANEOUS | 0 refills | Status: AC

## 2019-03-29 MED ORDER — TORSEMIDE 20 MG PO TABS: 20.0000 mg | ORAL_TABLET | Freq: Two times a day (BID) | ORAL | 0 refills | Status: DC

## 2019-03-29 MED ORDER — GABAPENTIN 800 MG PO TABS: 800.0000 mg | ORAL_TABLET | Freq: Three times a day (TID) | ORAL | 0 refills | Status: DC

## 2019-03-29 MED ORDER — ALPRAZOLAM 2 MG PO TABS: 2.0000 mg | ORAL_TABLET | Freq: Two times a day (BID) | ORAL | 0 refills | Status: DC

## 2019-03-29 MED ORDER — AMITRIPTYLINE HCL 100 MG PO TABS: 100.0000 mg | ORAL_TABLET | Freq: Every day | ORAL | 0 refills | Status: AC

## 2019-03-30 ENCOUNTER — Encounter (HOSPITAL_COMMUNITY)
Admission: RE | Admit: 2019-03-30 | Discharge: 2019-03-30 | Disposition: A | Discharge status: Home / Self Care | Payer: Medicare Other | Source: Skilled Nursing Facility | Attending: Adult Health | Admitting: Adult Health

## 2019-03-30 LAB — BASIC METABOLIC PANEL
Anion gap: 12 (ref 5–15)
BUN: 52 mg/dL — ABNORMAL HIGH (ref 8–23)
CO2: 34 mmol/L — ABNORMAL HIGH (ref 22–32)
Calcium: 9.9 mg/dL (ref 8.9–10.3)
Chloride: 91 mmol/L — ABNORMAL LOW (ref 98–111)
Creatinine, Ser: 1.08 mg/dL (ref 0.61–1.24)
GFR calc Af Amer: >60 mL/min (ref >60)
GFR calc non Af Amer: >60 mL/min (ref >60)
Glucose, Bld: 96 mg/dL (ref 70–99)
Potassium: 4.6 mmol/L (ref 3.5–5.1)
Sodium: 137 mmol/L (ref 135–145)

## 2019-03-31 ENCOUNTER — Other Ambulatory Visit (HOSPITAL_COMMUNITY)
Admission: RE | Admit: 2019-03-31 | Discharge: 2019-03-31 | Disposition: A | Discharge status: Home / Self Care | Payer: Medicare Other | Source: Skilled Nursing Facility | Attending: Internal Medicine | Admitting: Internal Medicine

## 2019-03-31 DIAGNOSIS — F411 Generalized anxiety disorder: Secondary | ICD-10-CM | POA: Insufficient documentation

## 2019-03-31 DIAGNOSIS — A403 Sepsis due to Streptococcus pneumoniae: Secondary | ICD-10-CM | POA: Insufficient documentation

## 2019-03-31 DIAGNOSIS — B179 Acute viral hepatitis, unspecified: Secondary | ICD-10-CM | POA: Insufficient documentation

## 2019-03-31 DIAGNOSIS — J441 Chronic obstructive pulmonary disease with (acute) exacerbation: Secondary | ICD-10-CM | POA: Insufficient documentation

## 2019-03-31 LAB — DIGOXIN LEVEL: Digoxin Level: 0.6 ng/mL — ABNORMAL LOW (ref 0.8–2.0)

## 2019-04-03 ENCOUNTER — Telehealth: Payer: Self-pay | Admitting: Adult Health

## 2019-04-06 ENCOUNTER — Other Ambulatory Visit: Payer: Self-pay | Admitting: Adult Health

## 2019-04-07 ENCOUNTER — Other Ambulatory Visit: Payer: Self-pay | Admitting: Adult Health

## 2019-04-12 ENCOUNTER — Encounter (HOSPITAL_COMMUNITY): Payer: Self-pay | Admitting: Emergency Medicine

## 2019-04-12 ENCOUNTER — Other Ambulatory Visit: Payer: Self-pay

## 2019-04-12 ENCOUNTER — Inpatient Hospital Stay (HOSPITAL_COMMUNITY)
Admission: EM | Admit: 2019-04-12 | Discharge: 2019-04-19 | DRG: 480 | Disposition: A | Discharge status: Skilled Nursing Facility | Payer: Medicare Other | Attending: Internal Medicine | Admitting: Internal Medicine

## 2019-04-12 DIAGNOSIS — E785 Hyperlipidemia, unspecified: Secondary | ICD-10-CM | POA: Diagnosis present

## 2019-04-12 DIAGNOSIS — D649 Anemia, unspecified: Secondary | ICD-10-CM | POA: Diagnosis present

## 2019-04-12 DIAGNOSIS — I471 Supraventricular tachycardia: Secondary | ICD-10-CM | POA: Diagnosis present

## 2019-04-12 DIAGNOSIS — W010XXA Fall on same level from slipping, tripping and stumbling without subsequent striking against object, initial encounter: Secondary | ICD-10-CM | POA: Diagnosis present

## 2019-04-12 DIAGNOSIS — L89612 Pressure ulcer of right heel, stage 2: Secondary | ICD-10-CM | POA: Diagnosis present

## 2019-04-12 DIAGNOSIS — E1151 Type 2 diabetes mellitus with diabetic peripheral angiopathy without gangrene: Secondary | ICD-10-CM | POA: Diagnosis present

## 2019-04-12 DIAGNOSIS — L89150 Pressure ulcer of sacral region, unstageable: Secondary | ICD-10-CM | POA: Diagnosis present

## 2019-04-12 DIAGNOSIS — N179 Acute kidney failure, unspecified: Secondary | ICD-10-CM | POA: Diagnosis present

## 2019-04-12 DIAGNOSIS — I739 Peripheral vascular disease, unspecified: Secondary | ICD-10-CM | POA: Diagnosis present

## 2019-04-12 DIAGNOSIS — Z87891 Personal history of nicotine dependence: Secondary | ICD-10-CM

## 2019-04-12 DIAGNOSIS — F0390 Unspecified dementia without behavioral disturbance: Secondary | ICD-10-CM | POA: Diagnosis present

## 2019-04-12 DIAGNOSIS — Z8673 Personal history of transient ischemic attack (TIA), and cerebral infarction without residual deficits: Secondary | ICD-10-CM

## 2019-04-12 DIAGNOSIS — K219 Gastro-esophageal reflux disease without esophagitis: Secondary | ICD-10-CM | POA: Diagnosis present

## 2019-04-12 DIAGNOSIS — N400 Enlarged prostate without lower urinary tract symptoms: Secondary | ICD-10-CM | POA: Diagnosis present

## 2019-04-12 DIAGNOSIS — Z800 Family history of malignant neoplasm of digestive organs: Secondary | ICD-10-CM

## 2019-04-12 DIAGNOSIS — F419 Anxiety disorder, unspecified: Secondary | ICD-10-CM | POA: Diagnosis present

## 2019-04-12 DIAGNOSIS — I2510 Atherosclerotic heart disease of native coronary artery without angina pectoris: Secondary | ICD-10-CM | POA: Diagnosis present

## 2019-04-12 DIAGNOSIS — L89623 Pressure ulcer of left heel, stage 3: Secondary | ICD-10-CM | POA: Diagnosis present

## 2019-04-12 DIAGNOSIS — Z419 Encounter for procedure for purposes other than remedying health state, unspecified: Secondary | ICD-10-CM

## 2019-04-12 DIAGNOSIS — Z20828 Contact with and (suspected) exposure to other viral communicable diseases: Secondary | ICD-10-CM | POA: Diagnosis present

## 2019-04-12 DIAGNOSIS — G9050 Complex regional pain syndrome I, unspecified: Secondary | ICD-10-CM | POA: Diagnosis present

## 2019-04-12 DIAGNOSIS — S72142A Displaced intertrochanteric fracture of left femur, initial encounter for closed fracture: Secondary | ICD-10-CM | POA: Diagnosis not present

## 2019-04-12 DIAGNOSIS — S72002A Fracture of unspecified part of neck of left femur, initial encounter for closed fracture: Secondary | ICD-10-CM | POA: Diagnosis present

## 2019-04-12 DIAGNOSIS — J61 Pneumoconiosis due to asbestos and other mineral fibers: Secondary | ICD-10-CM | POA: Diagnosis present

## 2019-04-12 DIAGNOSIS — G90522 Complex regional pain syndrome I of left lower limb: Secondary | ICD-10-CM | POA: Diagnosis present

## 2019-04-12 DIAGNOSIS — G459 Transient cerebral ischemic attack, unspecified: Secondary | ICD-10-CM | POA: Diagnosis present

## 2019-04-12 DIAGNOSIS — Z955 Presence of coronary angioplasty implant and graft: Secondary | ICD-10-CM

## 2019-04-12 DIAGNOSIS — F329 Major depressive disorder, single episode, unspecified: Secondary | ICD-10-CM | POA: Diagnosis present

## 2019-04-12 DIAGNOSIS — Z7951 Long term (current) use of inhaled steroids: Secondary | ICD-10-CM

## 2019-04-12 DIAGNOSIS — I4892 Unspecified atrial flutter: Secondary | ICD-10-CM | POA: Diagnosis present

## 2019-04-12 DIAGNOSIS — Z8701 Personal history of pneumonia (recurrent): Secondary | ICD-10-CM

## 2019-04-12 DIAGNOSIS — Z79899 Other long term (current) drug therapy: Secondary | ICD-10-CM

## 2019-04-12 DIAGNOSIS — R0789 Other chest pain: Secondary | ICD-10-CM | POA: Diagnosis present

## 2019-04-12 DIAGNOSIS — Y92009 Unspecified place in unspecified non-institutional (private) residence as the place of occurrence of the external cause: Secondary | ICD-10-CM

## 2019-04-12 DIAGNOSIS — I119 Hypertensive heart disease without heart failure: Secondary | ICD-10-CM | POA: Diagnosis present

## 2019-04-12 DIAGNOSIS — L89320 Pressure ulcer of left buttock, unstageable: Secondary | ICD-10-CM | POA: Diagnosis present

## 2019-04-12 DIAGNOSIS — J9611 Chronic respiratory failure with hypoxia: Secondary | ICD-10-CM | POA: Diagnosis present

## 2019-04-12 DIAGNOSIS — M25552 Pain in left hip: Secondary | ICD-10-CM

## 2019-04-12 DIAGNOSIS — J449 Chronic obstructive pulmonary disease, unspecified: Secondary | ICD-10-CM | POA: Diagnosis present

## 2019-04-12 DIAGNOSIS — J9612 Chronic respiratory failure with hypercapnia: Secondary | ICD-10-CM | POA: Diagnosis present

## 2019-04-12 DIAGNOSIS — H409 Unspecified glaucoma: Secondary | ICD-10-CM | POA: Diagnosis present

## 2019-04-12 DIAGNOSIS — Z85828 Personal history of other malignant neoplasm of skin: Secondary | ICD-10-CM

## 2019-04-12 DIAGNOSIS — Z7901 Long term (current) use of anticoagulants: Secondary | ICD-10-CM

## 2019-04-12 DIAGNOSIS — S72009A Fracture of unspecified part of neck of unspecified femur, initial encounter for closed fracture: Secondary | ICD-10-CM | POA: Diagnosis present

## 2019-04-12 DIAGNOSIS — Z9981 Dependence on supplemental oxygen: Secondary | ICD-10-CM

## 2019-04-12 DIAGNOSIS — I252 Old myocardial infarction: Secondary | ICD-10-CM

## 2019-04-12 DIAGNOSIS — I10 Essential (primary) hypertension: Secondary | ICD-10-CM | POA: Diagnosis present

## 2019-04-12 DIAGNOSIS — G9341 Metabolic encephalopathy: Secondary | ICD-10-CM | POA: Diagnosis present

## 2019-04-12 NOTE — ED Triage Notes (Signed)
Pt c/o left hip pain from fall Saturday. Son states pt cannot pivot or stand.

## 2019-04-12 NOTE — ED Provider Notes (Signed)
[facility] EMERGENCY DEPARTMENT Provider Note   CSN: [id number] Arrival date & time: 04/12/19  2248     History   Chief Complaint Chief Complaint  Patient presents with   Hip Pain    HPI Curtis [name] is a 73 y.o. male.     Patient presents to the emergency department for evaluation of hip injury.  Patient fell 3 days ago.  He reports that he was trying to sit down when he missed the chair and fell to the ground.  He normally gets around with a walker or wheelchair, doesn't walk without assistance.  Since the fall, however, he has been having severe left hip pain he cannot put any weight on the left leg.     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 s/p cath in 2015 showing occluded RCA and high-grade LCx stenosis treated with DESx2 to LCx   Cancer [facility])    Depression    Diverticulitis    hospital 2011 [facility]   Diverticulosis    GERD (gastroesophageal reflux disease)    Glaucoma 2016   bilateral   Hernia of unspecified site of abdominal cavity without mention of obstruction or gangrene    hiatal   Hyperlipidemia LDL goal <70 04/20/2014   Hypertension    IBS (irritable bowel syndrome)    Myocardial infarction ([place]) 01/2014   NSTEMI   Peripheral vascular disease (HCC)    ankle brachial index of 0.79 on the right and 0.61 on the left.    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    SOB (shortness of breath)     Patient Active Problem List   Diagnosis Date Noted   Protein-calorie malnutrition, severe ([place]) 03/12/2019   Hospital discharge follow-up 03/06/2019   Chronic diastolic heart failure ([place]) 03/02/2019   Unstageable pressure ulcer of left buttock ([place]) 02/21/2019   Decubitus ulcer of heel, bilateral, unstageable ([place])  02/21/2019   Acute encephalopathy 02/20/2019   Anasarca 02/20/2019   BPH with urinary obstruction 02/20/2019   Sacral decubitus ulcer, stage IV (HCC)    D-dimer, elevated 02/08/2019   Elevated liver enzymes 02/08/2019   Nausea vomiting and diarrhea 02/08/2019   Acute hepatitis 02/08/2019   Physical deconditioning 02/04/2019   Pressure injury of deep tissue of left heel 01/27/2019   Atrial flutter, chronic ([place]) 01/15/2019   Hypokalemia 01/15/2019   Bilateral open angle glaucoma 01/15/2019   Anxiety disorder [id number]   Acute diastolic CHF (congestive heart failure) ([place]) 01/03/2019   Pressure ulcer of right heel, stage 2 ([place]) 01/03/2019   COPD exacerbation ([place]) 01/02/2019   Acute on chronic respiratory failure with hypoxia and hypercapnia ([place]) 01/02/2019   Hyperkalemia 01/02/2019   Sepsis ([place]) 12/15/2018   Situational mixed anxiety and depressive disorder 02/28/2018   Carotid artery disease ([place]) 02/27/2016   Acute hyponatremia 02/12/2015   Hereditary and idiopathic peripheral neuropathy 01/30/2015   Acute on chronic respiratory failure ([place]) 05/08/2014   Anemia, iron deficiency 05/08/2014   Hyponatremia 05/08/2014   Urinary retention 05/08/2014   Chronic respiratory failure with hypoxia ([place]) 04/30/2014   Shortness of breath    HCAP (healthcare-associated pneumonia) 04/24/2014   Hyperlipidemia LDL goal <70 04/20/2014   AAA (abdominal aortic aneurysm), stable 04/20/2014   Abdominal pain, generalized  SOB (shortness of breath)    Peripheral vascular disease (HCC)    Presence of drug coated stent in left circumflex coronary artery: Promus DES 3.5 mm x 38 mm (3.9 mm) 01/17/2014    Class: Acute   CAD (coronary artery disease), native coronary artery    SVT (supraventricular tachycardia) s/p ablation 01/16/2014 01/16/2014   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   TIA (transient ischemic attack) 09/06/2013   HTN  (hypertension) 09/05/2013   AKI (acute kidney injury) ([place]) 11/01/2012   TOBACCO ABUSE, hx 08/23/2007   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   Asbestosis ([place]) 08/12/2007   Reflex sympathetic dystrophy 05/29/2007   GERD 05/29/2007    Past Surgical History:  Procedure Laterality Date   CARDIAC CATHETERIZATION     CATARACT EXTRACTION     CORONARY ANGIOPLASTY     HERNIA REPAIR     LEFT HEART CATHETERIZATION WITH CORONARY ANGIOGRAM N/A 01/17/2014   Procedure: LEFT HEART CATHETERIZATION WITH CORONARY ANGIOGRAM;  Surgeon: Leonie Man, MD;  Location: [facility] C[facility] LAB;  Service: Cardiovascular;  Laterality: N/A;   left shoulder      x 3   NISSEN FUNDOPLICATION     right elbow surgery     x 2   right knee arthroscopy     squamous cell skin cancer     Left Hand   SUPRAVENTRICULAR TACHYCARDIA ABLATION N/A 01/16/2014   Procedure: SUPRAVENTRICULAR TACHYCARDIA ABLATION;  Surgeon: Evans Lance, MD;  Location: [facility] CATH LAB;  Service: Cardiovascular;  Laterality: N/A;        Home Medications    Prior to Admission medications   Medication Sig Start Date End Date Taking? Authorizing Provider  acetaminophen (TYLENOL) 650 MG CR tablet Take 650 mg by mouth 3 (three) times daily.    [provider]  alprazolam [name]) 2 MG tablet Take 1 tablet (2 mg total) by mouth 2 (two) times daily. 03/29/19   Gerlene Fee, NP  Amino Acids-Protein Hydrolys (FEEDING SUPPLEMENT, PRO-STAT SUGAR FREE 64,) LIQD Take 30 mLs by mouth 2 (two) times daily between meals.     [provider]  amitriptyline (ELAVIL) 100 MG tablet Take 1 tablet (100 mg total) by mouth at bedtime. 03/29/19   Gerlene Fee, NP  apixaban (ELIQUIS) 5 MG TABS tablet Take 1 tablet (5 mg total) by mouth 2 (two) times daily. 03/29/19   Gerlene Fee, NP  Roseanne Kaufman [place]-Castor Oil [facility]) OINT Apply 1 application topically See admin instructions. Apply to coccyx, sacrum, bilateral buttocks each shift  and prn    [provider]  bisacodyl (DULCOLAX) 5 MG EC tablet Take 5 mg by mouth daily as needed for mild constipation or moderate constipation.    [provider]  budesonide-formoterol (SYMBICORT) 160-4.5 MCG/ACT inhaler Inhale 2 puffs into the lungs 2 (two) times daily. 03/29/19   Gerlene Fee, NP  camphor-menthol (ANTI-ITCH) lotion Apply 1 application topically 2 (two) times daily as needed for itching. Sparingly    [provider]  clotrimazole (LOTRIMIN) 1 % cream Instructions: apply to buttock rash three times daily Three Times A Day 09:00 AM, 02:00 PM, 09:00 PM    [provider]  collagenase (SANTYL) ointment Instructions: Apply to left heel per treatment orders.    [provider]  collagenase (SANTYL) ointment Apply 1 application topically daily. Apply to left buttock wound and bilateral heel ulcerations. 03/29/19   Gerlene Fee, NP  digoxin (LANOXIN) 0.125 MG tablet Take 1 tablet (  0.125 mg total) by mouth daily. 03/29/19   Gerlene Fee, NP  docusate sodium (COLACE) 100 MG capsule Take 100 mg by mouth daily as needed for mild constipation.    [provider]  finasteride (PROSCAR) 5 MG tablet Take 1 tablet (5 mg total) by mouth every morning. 03/29/19   Gerlene Fee, NP  gabapentin (NEURONTIN) 800 MG tablet Take 1 tablet (800 mg total) by mouth 3 (three) times daily. 03/29/19   Gerlene Fee, NP  guaiFENesin (MUCINEX) 600 MG 12 hr tablet Take 600 mg by mouth 2 (two) times daily.     [provider]  latanoprost (XALATAN) 0.005 % ophthalmic solution Place 1 drop into both eyes at bedtime. 03/29/19   Gerlene Fee, NP  metoprolol tartrate (LOPRESSOR) 25 MG tablet Take 2 tablets (50 mg total) by mouth 2 (two) times daily. 03/29/19   Gerlene Fee, NP  Multiple Vitamins-Minerals (MULTIVITAMIN WITH MINERALS) tablet Take 1 tablet by mouth daily.    [provider]  nitroGLYCERIN (NITROSTAT) 0.4 MG  SL tablet Place 1 tablet (0.4 mg total) under the tongue every 5 (five) minutes x 3 doses as needed for chest pain. 03/29/19   Gerlene Fee, NP  NON FORMULARY Diet: Regular, NAS, Consistent Carbohydrate    [provider]  ondansetron (ZOFRAN) 4 MG tablet Take 1 tablet (4 mg total) by mouth every 6 (six) hours as needed for nausea or vomiting. 03/29/19   Gerlene Fee, NP  OXYGEN Place 4 L/min into the nose continuous.     [provider]  spironolactone (ALDACTONE) 25 MG tablet Take 1 tablet (25 mg total) by mouth daily. 03/29/19   Gerlene Fee, NP  tamsulosin (FLOMAX) 0.4 MG CAPS capsule Take 1 capsule (0.4 mg total) by mouth at bedtime. 03/29/19   Gerlene Fee, NP  torsemide (DEMADEX) 20 MG tablet Take 1 tablet (20 mg total) by mouth 2 (two) times daily between meals. 03/29/19   Gerlene Fee, NP  lamoTRIgine (LAMICTAL) 100 MG tablet Take 1 tablet (100 mg total) by mouth 2 (two) times daily. 01/23/16 01/29/16  Kathrynn Ducking, MD    Family History Family History  Problem Relation Age of Onset   Colon cancer Mother    Colon cancer Maternal Aunt    Colon cancer Maternal Uncle    Heart attack Neg Hx    Stroke Neg Hx     Social History Social History   Tobacco Use   Smoking status: Former Smoker    Packs/day: 2.50    Years: 50.00    Pack years: 125.00    Types: Cigarettes    Quit date: 05/05/2005    Years since quitting: 13.9   Smokeless tobacco: Former Systems developer    Quit date: 05/03/2012  Substance Use Topics   Alcohol use: No    Alcohol/week: 0.0 standard drinks    Comment: last use 2013   Drug use: No     Allergies   Codeine and Trileptal [oxcarbazepine]   Review of Systems Review of Systems  Musculoskeletal: Positive for arthralgias.  All other systems reviewed and are negative.    Physical Exam Updated Vital Signs BP 118/73    Pulse 99    Temp 97.9 F (36.6 C)    Resp (!) 32    Ht 6' (1.829 m)    Wt 82.1 kg    SpO2 100%     BMI 24.55 kg/m   Physical Exam Vitals signs  and nursing note reviewed.  Constitutional:      General: He is not in acute distress.    Appearance: Normal appearance. He is well-developed.  HENT:     Head: Normocephalic and atraumatic.     Right Ear: Hearing normal.     Left Ear: Hearing normal.     Nose: Nose normal.  Eyes:     Conjunctiva/sclera: Conjunctivae normal.     Pupils: Pupils are equal, round, and reactive to light.  Neck:     Musculoskeletal: Normal range of motion and neck supple.  Cardiovascular:     Rate and Rhythm: Regular rhythm. Tachycardia present.     Heart sounds: S1 normal and S2 normal. No murmur. No friction rub. No gallop.   Pulmonary:     Effort: Accessory muscle usage present. No respiratory distress.     Breath sounds: Decreased air movement present. Rhonchi present.  Chest:     Chest wall: No tenderness.  Abdominal:     General: Bowel sounds are normal.     Palpations: Abdomen is soft.     Tenderness: There is no abdominal tenderness. There is no guarding or rebound. Negative signs include Murphy's sign and McBurney's sign.     Hernia: No hernia is present.  Musculoskeletal:     Left hip: He exhibits decreased range of motion and tenderness. He exhibits no deformity.  Skin:    General: Skin is warm and dry.     Findings: No rash.  Neurological:     Mental Status: He is alert and oriented to person, place, and time.     GCS: GCS eye subscore is 4. GCS verbal subscore is 5. GCS motor subscore is 6.     Cranial Nerves: No cranial nerve deficit.     Sensory: No sensory deficit.     Coordination: Coordination normal.  Psychiatric:        Speech: Speech normal.        Behavior: Behavior normal.        Thought Content: Thought content normal.      ED Treatments / Results  Labs (all labs ordered are listed, but only abnormal results are displayed) Labs Reviewed  CBC WITH DIFFERENTIAL/PLATELET - Abnormal; Notable for the following components:       Result Value   WBC 14.9 (*)    RBC 3.48 (*)    Hemoglobin 9.7 (*)    HCT 32.9 (*)    MCHC 29.5 (*)    RDW 16.4 (*)    nRBC 0.9 (*)    Neutro Abs 12.0 (*)    Lymphs Abs 0.5 (*)    Monocytes Absolute 1.2 (*)    Abs Immature Granulocytes 0.70 (*)    All other components within normal limits  PROTIME-INR - Abnormal; Notable for the following components:   Prothrombin Time 16.0 (*)    INR 1.3 (*)    All other components within normal limits  LACTIC ACID, PLASMA  BASIC METABOLIC PANEL  URINALYSIS, ROUTINE W REFLEX MICROSCOPIC  BRAIN NATRIURETIC PEPTIDE  POC SARS CORONAVIRUS 2 AG -  ED  TROPONIN I (HIGH SENSITIVITY)    EKG None  Radiology Dg Chest Port 1 View  Result Date: 04/13/2019 CLINICAL DATA:  Shortness of breath. EXAM: PORTABLE CHEST 1 VIEW COMPARISON:  Radiograph 02/15/2019. Chest CTA 02/11/2019 FINDINGS: Cardiomegaly with unchanged mediastinal contours. Atherosclerosis of the aortic arch. Bilateral calcified pleural plaques and pleural thickening/small effusions, chronic and not significantly changed. No evidence of acute airspace disease, evaluation  slightly limited due to underlying pleural disease. No pneumothorax. No acute osseous abnormalities. IMPRESSION: 1. Stable cardiomegaly. 2. Stable calcified pleural plaques and pleural chronic effusions. 3. No evidence of acute findings, evaluation slightly limited due to underlying pleural disease. Aortic Atherosclerosis (ICD10-I70.0). Electronically Signed   By: Keith Rake M.D.   On: 04/13/2019 00:40   Dg Hip Unilat With Pelvis 2-3 Views Left  Result Date: 04/13/2019 CLINICAL DATA:  Pain EXAM: DG HIP (WITH OR WITHOUT PELVIS) 2-3V LEFT COMPARISON:  None. FINDINGS: There is an acute mildly displaced intratrochanteric fracture of the proximal left femur. There is no dislocation. There is osteopenia. Vascular calcifications are noted. Phleboliths project over the patient's pelvis. IMPRESSION: Acute mildly displaced  intratrochanteric fracture of the proximal left femur. Electronically Signed   By: Constance Holster M.D.   On: 04/13/2019 00:39    Procedures Procedures (including critical care time)  Medications Ordered in ED Medications - No data to display   Initial Impression / Assessment and Plan / ED Course  I have reviewed the triage vital signs and the nursing notes.  Pertinent labs & imaging results that were available during my care of the patient were reviewed by me and considered in my medical decision making (see chart for details).        Patient presents to the emergency department for evaluation after a fall.  Patient fell 3 days ago while trying to transfer to a chair.  He has not been able to put any weight on the left leg or pivot since the fall.  X-ray confirms intertrochanteric fracture.  Patient noted to be dyspneic here in the ER.  EMS report that he was not on oxygen when they arrived.  He is supposed to be on 4 L normally.  He was very hypoxic initially for EMS on room air, but oxygenation has improved here now on his normal 4 L.  He has cough with a lot of congestion and upper airway resonance but chest x-ray does not show any acute infiltrates or new pathology.  Presentation consistent with his known asbestosis.  Patient is on Eliquis because of a history of atrial flutter.  Discussed with Dr. Stann Mainland, on-call for orthopedics at [facility].  Will see patient and determine treatment plan for hip fracture.  Patient will be admitted by the hospitalist service.     Final Clinical Impressions(s) / ED Diagnoses   Final diagnoses:  Hip pain, acute, left  Closed displaced intertrochanteric fracture of left femur, initial encounter [facility])    ED Discharge Orders    None       Marnie Fazzino, Gwenyth Allegra, MD 04/13/19 0221

## 2019-04-13 ENCOUNTER — Emergency Department (HOSPITAL_COMMUNITY): Payer: Medicare Other

## 2019-04-13 ENCOUNTER — Inpatient Hospital Stay (HOSPITAL_COMMUNITY): Payer: Medicare Other

## 2019-04-13 DIAGNOSIS — W010XXA Fall on same level from slipping, tripping and stumbling without subsequent striking against object, initial encounter: Secondary | ICD-10-CM | POA: Diagnosis present

## 2019-04-13 DIAGNOSIS — L89612 Pressure ulcer of right heel, stage 2: Secondary | ICD-10-CM | POA: Diagnosis present

## 2019-04-13 DIAGNOSIS — Z20828 Contact with and (suspected) exposure to other viral communicable diseases: Secondary | ICD-10-CM | POA: Diagnosis present

## 2019-04-13 DIAGNOSIS — G9341 Metabolic encephalopathy: Secondary | ICD-10-CM | POA: Diagnosis present

## 2019-04-13 DIAGNOSIS — J61 Pneumoconiosis due to asbestos and other mineral fibers: Secondary | ICD-10-CM | POA: Diagnosis not present

## 2019-04-13 DIAGNOSIS — F419 Anxiety disorder, unspecified: Secondary | ICD-10-CM | POA: Diagnosis present

## 2019-04-13 DIAGNOSIS — N400 Enlarged prostate without lower urinary tract symptoms: Secondary | ICD-10-CM | POA: Diagnosis present

## 2019-04-13 DIAGNOSIS — S72142A Displaced intertrochanteric fracture of left femur, initial encounter for closed fracture: Secondary | ICD-10-CM | POA: Diagnosis present

## 2019-04-13 DIAGNOSIS — G459 Transient cerebral ischemic attack, unspecified: Secondary | ICD-10-CM | POA: Diagnosis present

## 2019-04-13 DIAGNOSIS — J9621 Acute and chronic respiratory failure with hypoxia: Secondary | ICD-10-CM | POA: Diagnosis present

## 2019-04-13 DIAGNOSIS — D649 Anemia, unspecified: Secondary | ICD-10-CM | POA: Diagnosis present

## 2019-04-13 DIAGNOSIS — J449 Chronic obstructive pulmonary disease, unspecified: Secondary | ICD-10-CM | POA: Diagnosis present

## 2019-04-13 DIAGNOSIS — J9612 Chronic respiratory failure with hypercapnia: Secondary | ICD-10-CM | POA: Diagnosis present

## 2019-04-13 DIAGNOSIS — J9611 Chronic respiratory failure with hypoxia: Secondary | ICD-10-CM | POA: Diagnosis present

## 2019-04-13 DIAGNOSIS — K219 Gastro-esophageal reflux disease without esophagitis: Secondary | ICD-10-CM | POA: Diagnosis present

## 2019-04-13 DIAGNOSIS — L89623 Pressure ulcer of left heel, stage 3: Secondary | ICD-10-CM | POA: Diagnosis present

## 2019-04-13 DIAGNOSIS — I4892 Unspecified atrial flutter: Secondary | ICD-10-CM

## 2019-04-13 DIAGNOSIS — S72002A Fracture of unspecified part of neck of left femur, initial encounter for closed fracture: Secondary | ICD-10-CM | POA: Diagnosis not present

## 2019-04-13 DIAGNOSIS — J9622 Acute and chronic respiratory failure with hypercapnia: Secondary | ICD-10-CM | POA: Diagnosis present

## 2019-04-13 DIAGNOSIS — S72009A Fracture of unspecified part of neck of unspecified femur, initial encounter for closed fracture: Secondary | ICD-10-CM | POA: Diagnosis present

## 2019-04-13 DIAGNOSIS — I119 Hypertensive heart disease without heart failure: Secondary | ICD-10-CM | POA: Diagnosis present

## 2019-04-13 DIAGNOSIS — L89320 Pressure ulcer of left buttock, unstageable: Secondary | ICD-10-CM | POA: Diagnosis present

## 2019-04-13 DIAGNOSIS — G9050 Complex regional pain syndrome I, unspecified: Secondary | ICD-10-CM | POA: Diagnosis present

## 2019-04-13 DIAGNOSIS — Y92009 Unspecified place in unspecified non-institutional (private) residence as the place of occurrence of the external cause: Secondary | ICD-10-CM | POA: Diagnosis not present

## 2019-04-13 DIAGNOSIS — J90 Pleural effusion, not elsewhere classified: Secondary | ICD-10-CM | POA: Diagnosis present

## 2019-04-13 DIAGNOSIS — N179 Acute kidney failure, unspecified: Secondary | ICD-10-CM | POA: Diagnosis present

## 2019-04-13 DIAGNOSIS — I471 Supraventricular tachycardia: Secondary | ICD-10-CM | POA: Diagnosis present

## 2019-04-13 DIAGNOSIS — I2510 Atherosclerotic heart disease of native coronary artery without angina pectoris: Secondary | ICD-10-CM | POA: Diagnosis present

## 2019-04-13 DIAGNOSIS — F329 Major depressive disorder, single episode, unspecified: Secondary | ICD-10-CM | POA: Diagnosis present

## 2019-04-13 LAB — BLOOD GAS, ARTERIAL
Acid-Base Excess: 2.8 mmol/L — ABNORMAL HIGH (ref 0.0–2.0)
Bicarbonate: 26.2 mmol/L (ref 20.0–28.0)
Drawn by: 41977
FIO2: 32
O2 Saturation: 93 %
Patient temperature: 36.6
pCO2 arterial: 59 mmHg — ABNORMAL HIGH (ref 32.0–48.0)
pH, Arterial: 7.307 — ABNORMAL LOW (ref 7.350–7.450)
pO2, Arterial: 76.5 mmHg — ABNORMAL LOW (ref 83.0–108.0)

## 2019-04-13 LAB — CBC WITH DIFFERENTIAL/PLATELET
Abs Immature Granulocytes: 0.7 10*3/uL — ABNORMAL HIGH (ref 0.00–0.07)
Basophils Absolute: 0.1 10*3/uL (ref 0.0–0.1)
Basophils Relative: 1 %
Eosinophils Absolute: 0.3 10*3/uL (ref 0.0–0.5)
Eosinophils Relative: 2 %
HCT: 32.9 % — ABNORMAL LOW (ref 39.0–52.0)
Hemoglobin: 9.7 g/dL — ABNORMAL LOW (ref 13.0–17.0)
Immature Granulocytes: 5 %
Lymphocytes Relative: 4 %
Lymphs Abs: 0.5 10*3/uL — ABNORMAL LOW (ref 0.7–4.0)
MCH: 27.9 pg (ref 26.0–34.0)
MCHC: 29.5 g/dL — ABNORMAL LOW (ref 30.0–36.0)
MCV: 94.5 fL (ref 80.0–100.0)
Monocytes Absolute: 1.2 10*3/uL — ABNORMAL HIGH (ref 0.1–1.0)
Monocytes Relative: 8 %
Neutro Abs: 12 10*3/uL — ABNORMAL HIGH (ref 1.7–7.7)
Neutrophils Relative %: 80 %
Platelets: 341 10*3/uL (ref 150–400)
RBC: 3.48 MIL/uL — ABNORMAL LOW (ref 4.22–5.81)
RDW: 16.4 % — ABNORMAL HIGH (ref 11.5–15.5)
WBC: 14.9 10*3/uL — ABNORMAL HIGH (ref 4.0–10.5)
nRBC: 0.9 % — ABNORMAL HIGH (ref 0.0–0.2)

## 2019-04-13 LAB — CBC
HCT: 31.7 % — ABNORMAL LOW (ref 39.0–52.0)
Hemoglobin: 9.2 g/dL — ABNORMAL LOW (ref 13.0–17.0)
MCH: 27.7 pg (ref 26.0–34.0)
MCHC: 29 g/dL — ABNORMAL LOW (ref 30.0–36.0)
MCV: 95.5 fL (ref 80.0–100.0)
Platelets: 331 10*3/uL (ref 150–400)
RBC: 3.32 MIL/uL — ABNORMAL LOW (ref 4.22–5.81)
RDW: 16.3 % — ABNORMAL HIGH (ref 11.5–15.5)
WBC: 13.5 10*3/uL — ABNORMAL HIGH (ref 4.0–10.5)
nRBC: 0.7 % — ABNORMAL HIGH (ref 0.0–0.2)

## 2019-04-13 LAB — PROTIME-INR
INR: 1.3 — ABNORMAL HIGH (ref 0.8–1.2)
Prothrombin Time: 16 seconds — ABNORMAL HIGH (ref 11.4–15.2)

## 2019-04-13 LAB — BASIC METABOLIC PANEL
Anion gap: 11 (ref 5–15)
Anion gap: 10 (ref 5–15)
BUN: 39 mg/dL — ABNORMAL HIGH (ref 8–23)
BUN: 40 mg/dL — ABNORMAL HIGH (ref 8–23)
CO2: 28 mmol/L (ref 22–32)
CO2: 29 mmol/L (ref 22–32)
Calcium: 9.6 mg/dL (ref 8.9–10.3)
Calcium: 9.6 mg/dL (ref 8.9–10.3)
Chloride: 97 mmol/L — ABNORMAL LOW (ref 98–111)
Chloride: 98 mmol/L (ref 98–111)
Creatinine, Ser: 1.5 mg/dL — ABNORMAL HIGH (ref 0.61–1.24)
Creatinine, Ser: 1.31 mg/dL — ABNORMAL HIGH (ref 0.61–1.24)
GFR calc Af Amer: 52 mL/min — ABNORMAL LOW (ref >60)
GFR calc Af Amer: >60 mL/min (ref >60)
GFR calc non Af Amer: 45 mL/min — ABNORMAL LOW (ref >60)
GFR calc non Af Amer: 53 mL/min — ABNORMAL LOW (ref >60)
Glucose, Bld: 142 mg/dL — ABNORMAL HIGH (ref 70–99)
Glucose, Bld: 155 mg/dL — ABNORMAL HIGH (ref 70–99)
Potassium: 4.9 mmol/L (ref 3.5–5.1)
Potassium: 4.8 mmol/L (ref 3.5–5.1)
Sodium: 136 mmol/L (ref 135–145)
Sodium: 137 mmol/L (ref 135–145)

## 2019-04-13 LAB — LACTIC ACID, PLASMA: Lactic Acid, Venous: 1.7 mmol/L (ref 0.5–1.9)

## 2019-04-13 LAB — BRAIN NATRIURETIC PEPTIDE: B Natriuretic Peptide: 755 pg/mL — ABNORMAL HIGH (ref 0.0–100.0)

## 2019-04-13 LAB — TROPONIN I (HIGH SENSITIVITY)
Troponin I (High Sensitivity): 65 ng/L — ABNORMAL HIGH (ref <18)
Troponin I (High Sensitivity): 74 ng/L — ABNORMAL HIGH (ref <18)

## 2019-04-13 LAB — SARS CORONAVIRUS 2 (TAT 6-24 HRS): SARS Coronavirus 2: NEGATIVE

## 2019-04-13 MED ORDER — FLUTICASONE FUROATE-VILANTEROL 200-25 MCG/INH IN AEPB
1.0000 | INHALATION_SPRAY | Freq: Every day | RESPIRATORY_TRACT | Status: DC
  Administered 2019-04-13 – 2019-04-19 (×4): 1 via RESPIRATORY_TRACT
  Filled 2019-04-13 (×2): qty 28

## 2019-04-13 MED ORDER — ADULT MULTIVITAMIN W/MINERALS CH
1.0000 | ORAL_TABLET | Freq: Every day | ORAL | Status: DC
  Administered 2019-04-13 – 2019-04-19 (×6): 1 via ORAL
  Filled 2019-04-13 (×7): qty 1

## 2019-04-13 MED ORDER — TORSEMIDE 20 MG PO TABS
20.0000 mg | ORAL_TABLET | Freq: Two times a day (BID) | ORAL | Status: DC
  Administered 2019-04-13: 20 mg via ORAL
  Filled 2019-04-13 (×6): qty 1

## 2019-04-13 MED ORDER — HYDROMORPHONE HCL 1 MG/ML IJ SOLN
0.5000 mg | Freq: Once | INTRAMUSCULAR | Status: DC
  Filled 2019-04-13: qty 1

## 2019-04-13 MED ORDER — NITROGLYCERIN 0.4 MG SL SUBL: 0.4000 mg | SUBLINGUAL_TABLET | SUBLINGUAL | Status: DC | PRN

## 2019-04-13 MED ORDER — ACETAMINOPHEN 650 MG RE SUPP: 650.0000 mg | Freq: Four times a day (QID) | RECTAL | Status: DC | PRN

## 2019-04-13 MED ORDER — COLLAGENASE 250 UNIT/GM EX OINT
1.0000 "application " | TOPICAL_OINTMENT | Freq: Every day | CUTANEOUS | Status: DC
  Administered 2019-04-13 – 2019-04-19 (×6): 1 via TOPICAL
  Filled 2019-04-13 (×2): qty 30

## 2019-04-13 MED ORDER — SODIUM CHLORIDE 0.9% FLUSH: 3.0000 mL | INTRAVENOUS | Status: DC | PRN

## 2019-04-13 MED ORDER — SPIRONOLACTONE 25 MG PO TABS
25.0000 mg | ORAL_TABLET | Freq: Every day | ORAL | Status: DC
  Administered 2019-04-13 – 2019-04-19 (×6): 25 mg via ORAL
  Filled 2019-04-13 (×8): qty 1

## 2019-04-13 MED ORDER — DOCUSATE SODIUM 100 MG PO CAPS: 100.0000 mg | ORAL_CAPSULE | Freq: Every day | ORAL | Status: DC | PRN

## 2019-04-13 MED ORDER — SODIUM CHLORIDE 0.9 % IV SOLN: 250.0000 mL | INTRAVENOUS | Status: DC | PRN

## 2019-04-13 MED ORDER — BISACODYL 5 MG PO TBEC: 5.0000 mg | DELAYED_RELEASE_TABLET | Freq: Every day | ORAL | Status: DC | PRN

## 2019-04-13 MED ORDER — POLYETHYLENE GLYCOL 3350 17 G PO PACK: 17.0000 g | PACK | Freq: Every day | ORAL | Status: DC | PRN

## 2019-04-13 MED ORDER — SODIUM CHLORIDE 0.9 % IV SOLN
INTRAVENOUS | Status: DC
  Administered 2019-04-13 – 2019-04-14 (×3): via INTRAVENOUS

## 2019-04-13 MED ORDER — ONDANSETRON HCL 4 MG/2ML IJ SOLN
4.0000 mg | Freq: Four times a day (QID) | INTRAMUSCULAR | Status: DC | PRN
  Administered 2019-04-14: 4 mg via INTRAVENOUS

## 2019-04-13 MED ORDER — BISACODYL 10 MG RE SUPP: 10.0000 mg | Freq: Every day | RECTAL | Status: DC | PRN

## 2019-04-13 MED ORDER — FINASTERIDE 5 MG PO TABS
5.0000 mg | ORAL_TABLET | Freq: Every day | ORAL | Status: DC
  Administered 2019-04-13 – 2019-04-19 (×6): 5 mg via ORAL
  Filled 2019-04-13 (×8): qty 1

## 2019-04-13 MED ORDER — METOPROLOL TARTRATE 25 MG PO TABS
25.0000 mg | ORAL_TABLET | Freq: Two times a day (BID) | ORAL | Status: DC
  Administered 2019-04-13 – 2019-04-19 (×13): 25 mg via ORAL
  Filled 2019-04-13 (×13): qty 1

## 2019-04-13 MED ORDER — LATANOPROST 0.005 % OP SOLN
1.0000 [drp] | Freq: Every day | OPHTHALMIC | Status: DC
  Administered 2019-04-14 – 2019-04-18 (×6): 1 [drp] via OPHTHALMIC
  Filled 2019-04-13 (×2): qty 2.5

## 2019-04-13 MED ORDER — ACETAMINOPHEN 325 MG PO TABS
650.0000 mg | ORAL_TABLET | Freq: Four times a day (QID) | ORAL | Status: DC | PRN
  Administered 2019-04-13: 650 mg via ORAL
  Filled 2019-04-13: qty 2

## 2019-04-13 MED ORDER — PRO-STAT SUGAR FREE PO LIQD
30.0000 mL | Freq: Two times a day (BID) | ORAL | Status: DC
  Administered 2019-04-13 – 2019-04-19 (×10): 30 mL via ORAL
  Filled 2019-04-13 (×15): qty 30

## 2019-04-13 MED ORDER — TAMSULOSIN HCL 0.4 MG PO CAPS
0.4000 mg | ORAL_CAPSULE | Freq: Every day | ORAL | Status: DC
  Administered 2019-04-13 – 2019-04-18 (×5): 0.4 mg via ORAL
  Filled 2019-04-13 (×6): qty 1

## 2019-04-13 MED ORDER — TORSEMIDE 20 MG PO TABS
20.0000 mg | ORAL_TABLET | Freq: Two times a day (BID) | ORAL | Status: DC
  Administered 2019-04-15 – 2019-04-16 (×4): 20 mg via ORAL
  Filled 2019-04-13 (×5): qty 1

## 2019-04-13 MED ORDER — SODIUM CHLORIDE 0.9% FLUSH
3.0000 mL | Freq: Two times a day (BID) | INTRAVENOUS | Status: DC
  Administered 2019-04-13 – 2019-04-14 (×3): 3 mL via INTRAVENOUS

## 2019-04-13 MED ORDER — DIGOXIN 125 MCG PO TABS
0.1250 mg | ORAL_TABLET | Freq: Every day | ORAL | Status: DC
  Administered 2019-04-13 – 2019-04-19 (×6): 0.125 mg via ORAL
  Filled 2019-04-13 (×8): qty 1

## 2019-04-13 MED ORDER — ONDANSETRON HCL 4 MG PO TABS: 4.0000 mg | ORAL_TABLET | Freq: Four times a day (QID) | ORAL | Status: DC | PRN

## 2019-04-13 NOTE — Progress Notes (Signed)
ASSUMPTION OF CARE NOTE   04/13/2019 11:39 AM  Curtis [name] was seen and examined.  The H&P by the admitting provider, orders, imaging was reviewed.  Please see new orders.  Pt is awaiting for transfer to [facility] for orthopedics consultation.  He remains stable to transfer.  His status has been changed to Med-surg.  Please notify orthopedics when he arrives at [facility].    Vitals:   04/13/19 1100 04/13/19 1106  BP: 137/64   Pulse: 93 100  Resp: (!) 22 20  Temp:    SpO2: 90% 92%    Results for orders placed or performed during the hospital encounter of 04/12/19  CBC with Differential  Result Value Ref Range   WBC 14.9 (H) 4.0 - 10.5 K/uL   RBC 3.48 (L) 4.22 - 5.81 MIL/uL   Hemoglobin 9.7 (L) 13.0 - 17.0 g/dL   HCT 32.9 (L) 39.0 - 52.0 %   MCV 94.5 80.0 - 100.0 fL   MCH 27.9 26.0 - 34.0 pg   MCHC 29.5 (L) 30.0 - 36.0 g/dL   RDW 16.4 (H) 11.5 - 15.5 %   Platelets 341 150 - 400 K/uL   nRBC 0.9 (H) 0.0 - 0.2 %   Neutrophils Relative % 80 %   Neutro Abs 12.0 (H) 1.7 - 7.7 K/uL   Lymphocytes Relative 4 %   Lymphs Abs 0.5 (L) 0.7 - 4.0 K/uL   Monocytes Relative 8 %   Monocytes Absolute 1.2 (H) 0.1 - 1.0 K/uL   Eosinophils Relative 2 %   Eosinophils Absolute 0.3 0.0 - 0.5 K/uL   Basophils Relative 1 %   Basophils Absolute 0.1 0.0 - 0.1 K/uL   Immature Granulocytes 5 %   Abs Immature Granulocytes 0.70 (H) 0.00 - 0.07 K/uL  Basic metabolic panel  Result Value Ref Range   Sodium 136 135 - 145 mmol/L   Potassium 4.9 3.5 - 5.1 mmol/L   Chloride 97 (L) 98 - 111 mmol/L   CO2 28 22 - 32 mmol/L   Glucose, Bld 142 (H) 70 - 99 mg/dL   BUN 39 (H) 8 - 23 mg/dL   Creatinine, Ser 1.50 (H) 0.61 - 1.24 mg/dL   Calcium 9.6 8.9 - 10.3 mg/dL   GFR calc non Af Amer 45 (L) >60 mL/min   GFR calc Af Amer 52 (L) >60 mL/min   Anion gap 11 5 - 15  Protime-INR  Result Value Ref Range   Prothrombin Time 16.0 (H) 11.4 - 15.2 seconds   INR 1.3 (H) 0.8 - 1.2  Lactic acid, plasma  Result  Value Ref Range   Lactic Acid, Venous 1.7 0.5 - 1.9 mmol/L  Brain natriuretic peptide  Result Value Ref Range   B Natriuretic Peptide 755.0 (H) 0.0 - 100.0 pg/mL  Basic metabolic panel  Result Value Ref Range   Sodium 137 135 - 145 mmol/L   Potassium 4.8 3.5 - 5.1 mmol/L   Chloride 98 98 - 111 mmol/L   CO2 29 22 - 32 mmol/L   Glucose, Bld 155 (H) 70 - 99 mg/dL   BUN 40 (H) 8 - 23 mg/dL   Creatinine, Ser 1.31 (H) 0.61 - 1.24 mg/dL   Calcium 9.6 8.9 - 10.3 mg/dL   GFR calc non Af Amer 53 (L) >60 mL/min   GFR calc Af Amer >60 >60 mL/min   Anion gap 10 5 - 15  CBC  Result Value Ref Range   WBC 13.5 (H) 4.0 -  10.5 K/uL   RBC 3.32 (L) 4.22 - 5.81 MIL/uL   Hemoglobin 9.2 (L) 13.0 - 17.0 g/dL   HCT 31.7 (L) 39.0 - 52.0 %   MCV 95.5 80.0 - 100.0 fL   MCH 27.7 26.0 - 34.0 pg   MCHC 29.0 (L) 30.0 - 36.0 g/dL   RDW 16.3 (H) 11.5 - 15.5 %   Platelets 331 150 - 400 K/uL   nRBC 0.7 (H) 0.0 - 0.2 %  Blood gas, arterial  Result Value Ref Range   FIO2 32.00    Delivery systems NASAL CANNULA    pH, Arterial 7.307 (L) 7.350 - 7.450   pCO2 arterial 59.0 (H) 32.0 - 48.0 mmHg   pO2, Arterial 76.5 (L) 83.0 - 108.0 mmHg   Bicarbonate 26.2 20.0 - 28.0 mmol/L   Acid-Base Excess 2.8 (H) 0.0 - 2.0 mmol/L   O2 Saturation 93.0 %   Patient temperature 36.6    Collection site RIGHT RADIAL    Drawn by [id number]    Allens test (pass/fail) PASS PASS  Troponin I (High Sensitivity)  Result Value Ref Range   Troponin I (High Sensitivity) 65 (H) <18 ng/L  Troponin I (High Sensitivity)  Result Value Ref Range   Troponin I (High Sensitivity) 74 (H) <18 ng/L    C. Wynetta Emery, MD [facility]   04/12/2019 10:48 PM How to contact the [facility] Attending or Consulting provider [place] or covering provider during after hours [place], for this patient?  1. Check the care team in [facility] and look for a) attending/consulting TR[facility] provider listed and b) the [facility] team listed 2. Log into [email or web address] and use [place]'s  universal password to access. If you do not have the password, please contact the hospital operator. 3. Locate the [facility] provider you are looking for under [facility] and page to a number that you can be directly reached. 4. If you still have difficulty reaching the provider, please page the [facility] (Director on Call) for the Hospitalists listed on amion for assistance.

## 2019-04-13 NOTE — ED Notes (Signed)
ED TO INPATIENT HANDOFF REPORT  ED Nurse Name and Phone #: [phone]  S Name/Age/Gender Curtis [name] 73 y.o. male Room/Bed: APA06/APA06  Code Status   Code Status: Full Code  Home/SNF/Other Home Patient oriented to: self, place, time and situation Is this baseline? Yes   Triage Complete: Triage complete  Chief Complaint Weakness, leg pain   Triage Note Pt c/o left hip pain from fall Saturday. Son states pt cannot pivot or stand.   Allergies Allergies  Allergen Reactions  . Codeine Itching  . Trileptal [Oxcarbazepine]     Other reaction(s): Other (See Comments) Abnormal sodium levels Hyponatremia    Level of Care/Admitting Diagnosis ED Disposition    ED Disposition Condition Comment   Admit  Hospital Area: [facility] [[id number]]  Level of Care: Med-Surg [16]  Covid Evaluation: Asymptomatic Screening Protocol (No Symptoms)  [facility]: [id number]  Admitting Physician: Murlean Iba [[id number]]  Attending Physician: Murlean Iba [[id number]]  Estimated length of stay: past midnight tomorrow  Certification:: I certify this patient will need inpatient services for at least 2 midnights  PT Class (Do Not Modify): Inpatient [101]  PT Acc Code (Do Not Modify): Private [1]       B Medical/Surgery History Past Medical History:  Diagnosis Date  . AAA (abdominal aortic aneurysm), stable 04/20/2014  . Arthritis   . 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 CAD (coronary artery disease)    a. s/p cath in 2015 showing occluded RCA and high-grade LCx stenosis treated with DESx2 to LCx  . Cancer ([place])   . Depression   . Diverticulitis    hospital 2011 [facility]  . Diverticulosis   . GERD (gastroesophageal reflux disease)   . 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 Hernia of unspecified site of abdominal cavity without mention of obstruction or gangrene    hiatal  . Hyperlipidemia LDL goal <70 04/20/2014  . Hypertension   . IBS (irritable bowel syndrome)    . Myocardial infarction ([place]) 01/2014   NSTEMI  . Peripheral vascular disease (HCC)    ankle brachial index of 0.79 on the right and 0.61 on the left.   . Pneumonia   . 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 Reflex sympathetic dystrophy   . 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 SOB (shortness of breath)    Past Surgical History:  Procedure Laterality Date  . CARDIAC CATHETERIZATION    . CATARACT EXTRACTION    . CORONARY ANGIOPLASTY    . HERNIA REPAIR    . LEFT HEART CATHETERIZATION WITH CORONARY ANGIOGRAM N/A 01/17/2014   Procedure: LEFT HEART CATHETERIZATION WITH CORONARY ANGIOGRAM;  Surgeon: Leonie Man, MD;  Location: [facility] CATH LAB;  Service: Cardiovascular;  Laterality: N/A;  . left shoulder      x 3  . NISSEN FUNDOPLICATION    . right elbow surgery     x 2  . right knee arthroscopy    . squamous cell skin cancer     Left Hand  . SUPRAVENTRICULAR TACHYCARDIA ABLATION N/A 01/16/2014   Procedure: SUPRAVENTRICULAR TACHYCARDIA ABLATION;  Surgeon: Evans Lance, MD;  Location: [facility] CATH LAB;  Service: Cardiovascular;  Laterality: N/A;     A IV Location/Drains/Wounds Patient Lines/Drains/Airways Status   Active Line/Drains/Airways    Name:   Placement date:   Placement time:   Site:   Days:   Peripheral IV 04/13/19 Right Antecubital   04/13/19    0000    Antecubital  less than 1   Urethral Catheter Evalyn Casco, RN Non-latex 16 Fr.   01/07/19    1343    Non-latex   96   Pressure Injury 02/09/19 Heel Left Unstageable - Full thickness tissue loss in which the base of the ulcer is covered by slough (yellow, tan, gray, green or brown) and/or eschar (tan, brown or black) in the wound bed. [place] nurse updated, this is an unstag   02/09/19    1015     63   Pressure Injury 02/09/19 Heel Right Unstageable - Full thickness tissue loss in which the base of the ulcer is covered by slough (yellow, tan, gray, green or brown) and/or eschar (tan, brown or black) in the wound bed. [place]  nurse updated, this wound is un   02/09/19    1015     63   Pressure Injury 02/09/19 Sacrum Medial Stage II -  Partial thickness loss of dermis presenting as a shallow open ulcer with a red, pink wound bed without slough. 4cm x 5cm center area wound with dark red wound base, periwound area with reddness and sloug   02/09/19    1015     63          Intake/Output Last 24 hours No intake or output data in the 24 hours ending 04/13/19 1530  Labs/Imaging Results for orders placed or performed during the hospital encounter of 04/12/19 (from the past 48 hour(s))  CBC with Differential     Status: Abnormal   Collection Time: 04/13/19 12:27 AM  Result Value Ref Range   WBC 14.9 (H) 4.0 - 10.5 K/uL   RBC 3.48 (L) 4.22 - 5.81 MIL/uL   Hemoglobin 9.7 (L) 13.0 - 17.0 g/dL   HCT 32.9 (L) 39.0 - 52.0 %   MCV 94.5 80.0 - 100.0 fL   MCH 27.9 26.0 - 34.0 pg   MCHC 29.5 (L) 30.0 - 36.0 g/dL   RDW 16.4 (H) 11.5 - 15.5 %   Platelets 341 150 - 400 K/uL   nRBC 0.9 (H) 0.0 - 0.2 %   Neutrophils Relative % 80 %   Neutro Abs 12.0 (H) 1.7 - 7.7 K/uL   Lymphocytes Relative 4 %   Lymphs Abs 0.5 (L) 0.7 - 4.0 K/uL   Monocytes Relative 8 %   Monocytes Absolute 1.2 (H) 0.1 - 1.0 K/uL   Eosinophils Relative 2 %   Eosinophils Absolute 0.3 0.0 - 0.5 K/uL   Basophils Relative 1 %   Basophils Absolute 0.1 0.0 - 0.1 K/uL   Immature Granulocytes 5 %   Abs Immature Granulocytes 0.70 (H) 0.00 - 0.07 K/uL    Comment: Performed at [facility], [address] [place], [place] [id number]  Basic metabolic panel     Status: Abnormal   Collection Time: 04/13/19 12:27 AM  Result Value Ref Range   Sodium 136 135 - 145 mmol/L   Potassium 4.9 3.5 - 5.1 mmol/L   Chloride 97 (L) 98 - 111 mmol/L   CO2 28 22 - 32 mmol/L   Glucose, Bld 142 (H) 70 - 99 mg/dL   BUN 39 (H) 8 - 23 mg/dL   Creatinine, Ser 1.50 (H) 0.61 - 1.24 mg/dL   Calcium 9.6 8.9 - 10.3 mg/dL   GFR calc non Af Amer 45 (L) >60 mL/min   GFR calc Af Amer 52 (L)  >60 mL/min   Anion gap 11 5 - 15    Comment: Performed at [facility],  [address] [place], [place] [address]  Protime-INR     Status: Abnormal   Collection Time: 04/13/19 12:27 AM  Result Value Ref Range   Prothrombin Time 16.0 (H) 11.4 - 15.2 seconds   INR 1.3 (H) 0.8 - 1.2    Comment: (NOTE) INR goal varies based on device and disease states. Performed at [facility], [address], [place], [place] [address]   Lactic acid, plasma     Status: None   Collection Time: 04/13/19 12:27 AM  Result Value Ref Range   Lactic Acid, Venous 1.7 0.5 - 1.9 mmol/L    Comment: Performed at [facility], [address], [place], [place] [address]  Brain natriuretic peptide     Status: Abnormal   Collection Time: 04/13/19 12:27 AM  Result Value Ref Range   B Natriuretic Peptide 755.0 (H) 0.0 - 100.0 pg/mL    Comment: Performed at [facility], [address] [place], [place] [address]  Troponin I (High Sensitivity)     Status: Abnormal   Collection Time: 04/13/19 12:27 AM  Result Value Ref Range   Troponin I (High Sensitivity) 65 (H) <18 ng/L    Comment: (NOTE) Elevated high sensitivity troponin I (hsTnI) values and significant  changes across serial measurements may suggest ACS but many other  chronic and acute conditions are known to elevate hsTnI results.  Refer to the "Links" section for chest pain algorithms and additional  guidance. Performed at [facility], [address] [place], [place] [address]   SARS CORONAVIRUS 2 (TAT 6-24 HRS) Nasopharyngeal Nasopharyngeal Swab     Status: None   Collection Time: 04/13/19  2:13 AM   Specimen: Nasopharyngeal Swab  Result Value Ref Range   SARS Coronavirus 2 NEGATIVE NEGATIVE    Comment: (NOTE) SARS-CoV-2 target nucleic acids are NOT DETECTED. The SARS-CoV-2 RNA is generally detectable in upper and lower respiratory specimens during the acute phase of infection. Negative results do not preclude SARS-CoV-2 infection, do not rule  out co-infections with other pathogens, and should not be used as the sole basis for treatment or other patient management decisions. Negative results must be combined with clinical observations, patient history, and epidemiological information. The expected result is Negative. Fact Sheet for Patients: SugarRoll.be Fact Sheet for Healthcare Providers: https://www.woods-mathews.com/ This test is not yet approved or cleared by the [place] FDA and  has been authorized for detection and/or diagnosis of SARS-CoV-2 by FDA under an Emergency Use Authorization (EUA). This EUA will remain  in effect (meaning this test can be used) for the duration of the COVID-19 declaration under Section 56 4(b)(1) of the Act, 21 U.S.C. section 360bbb-3(b)(1), unless the authorization is terminated or revoked sooner. Performed at [facility], [place] [address] [place], [place] [address]   Troponin I (High Sensitivity)     Status: Abnormal   Collection Time: 04/13/19  2:50 AM  Result Value Ref Range   Troponin I (High Sensitivity) 74 (H) <18 ng/L    Comment: (NOTE) Elevated high sensitivity troponin I (hsTnI) values and significant  changes across serial measurements may suggest ACS but many other  chronic and acute conditions are known to elevate hsTnI results.  Refer to the "Links" section for chest pain algorithms and additional  guidance. Performed at [facility], [address] [place], [place] [address]   Blood gas, arterial     Status: Abnormal   Collection Time: 04/13/19  3:12 AM  Result Value Ref Range   FIO2 32.00  Delivery systems NASAL CANNULA    pH, Arterial 7.307 (L) 7.350 - 7.450   pCO2 arterial 59.0 (H) 32.0 - 48.0 mmHg   pO2, Arterial 76.5 (L) 83.0 - 108.0 mmHg   Bicarbonate 26.2 20.0 - 28.0 mmol/L   Acid-Base Excess 2.8 (H) 0.0 - 2.0 mmol/L   O2 Saturation 93.0 %   Patient temperature 36.6    Collection site RIGHT RADIAL     Drawn by [id number]    [place] test (pass/fail) PASS PASS    Comment: Performed at [facility], [address], [place], [place] [id number]  Basic metabolic panel     Status: Abnormal   Collection Time: 04/13/19  6:50 AM  Result Value Ref Range   Sodium 137 135 - 145 mmol/L   Potassium 4.8 3.5 - 5.1 mmol/L   Chloride 98 98 - 111 mmol/L   CO2 29 22 - 32 mmol/L   Glucose, Bld 155 (H) 70 - 99 mg/dL   BUN 40 (H) 8 - 23 mg/dL   Creatinine, Ser 1.31 (H) 0.61 - 1.24 mg/dL   Calcium 9.6 8.9 - 10.3 mg/dL   GFR calc non Af Amer 53 (L) >60 mL/min   GFR calc Af Amer >60 >60 mL/min   Anion gap 10 5 - 15    Comment: Performed at [facility], [address] [place], [place] [address]  CBC     Status: Abnormal   Collection Time: 04/13/19  6:50 AM  Result Value Ref Range   WBC 13.5 (H) 4.0 - 10.5 K/uL   RBC 3.32 (L) 4.22 - 5.81 MIL/uL   Hemoglobin 9.2 (L) 13.0 - 17.0 g/dL   HCT 31.7 (L) 39.0 - 52.0 %   MCV 95.5 80.0 - 100.0 fL   MCH 27.7 26.0 - 34.0 pg   MCHC 29.0 (L) 30.0 - 36.0 g/dL   RDW 16.3 (H) 11.5 - 15.5 %   Platelets 331 150 - 400 K/uL   nRBC 0.7 (H) 0.0 - 0.2 %    Comment: Performed at [facility], [address] [place], [place] [address]   Dg Chest Port 1 View  Result Date: 04/13/2019 CLINICAL DATA:  Shortness of breath. EXAM: PORTABLE CHEST 1 VIEW COMPARISON:  Radiograph 02/15/2019. Chest CTA 02/11/2019 FINDINGS: Cardiomegaly with unchanged mediastinal contours. Atherosclerosis of the aortic arch. Bilateral calcified pleural plaques and pleural thickening/small effusions, chronic and not significantly changed. No evidence of acute airspace disease, evaluation slightly limited due to underlying pleural disease. No pneumothorax. No acute osseous abnormalities. IMPRESSION: 1. Stable cardiomegaly. 2. Stable calcified pleural plaques and pleural chronic effusions. 3. No evidence of acute findings, evaluation slightly limited due to underlying pleural disease. Aortic Atherosclerosis (ICD10-I70.0).  Electronically Signed   By: Keith Rake M.D.   On: 04/13/2019 00:40   Dg Knee Left Port  Result Date: 04/13/2019 CLINICAL DATA:  Fall, known left hip fracture EXAM: PORTABLE LEFT KNEE - 1-2 VIEW COMPARISON:  None. FINDINGS: No fracture or dislocation is seen. Mild tricompartmental degenerative changes. Visualized soft tissues are within normal limits. Vascular calcifications. No suprapatellar knee joint effusion. IMPRESSION: Negative. Electronically Signed   By: Julian Hy M.D.   On: 04/13/2019 08:22   Dg Hip Unilat With Pelvis 2-3 Views Left  Result Date: 04/13/2019 CLINICAL DATA:  Pain EXAM: DG HIP (WITH OR WITHOUT PELVIS) 2-3V LEFT COMPARISON:  None. FINDINGS: There is an acute mildly displaced intratrochanteric fracture of the proximal left femur. There is no dislocation. There is osteopenia. Vascular calcifications are noted.  Phleboliths project over the patient's pelvis. IMPRESSION: Acute mildly displaced intratrochanteric fracture of the proximal left femur. Electronically Signed   By: Constance Holster M.D.   On: 04/13/2019 00:39    Pending Labs Unresulted Labs (From admission, onward)    Start     Ordered   04/14/19 0500  CBC with Differential  Tomorrow morning,   R     04/13/19 1414   04/14/19 0500  Comprehensive metabolic panel  Daily,   R     04/13/19 1414   04/14/19 0500  Magnesium  Tomorrow morning,   R     04/13/19 1414   04/12/19 2358  Urinalysis, Routine w reflex microscopic  ONCE - STAT,   STAT     04/12/19 2357          Vitals/Pain Today's Vitals   04/13/19 1330 04/13/19 1345 04/13/19 1400 04/13/19 1415  BP: 106/65  113/63   Pulse: 80 (!) 40 (!) 41 (!) 40  Resp: 19 19 (!) 28 (!) 25  Temp:      SpO2: 96% 95% 97% 100%  Weight:      Height:      PainSc:        Isolation Precautions No active isolations  Medications Medications  digoxin (LANOXIN) tablet 0.125 mg (0.125 mg Oral Given 04/13/19 1121)  metoprolol tartrate (LOPRESSOR) tablet 25 mg  (25 mg Oral Given 04/13/19 1103)  nitroGLYCERIN (NITROSTAT) SL tablet 0.4 mg (has no administration in time range)  spironolactone (ALDACTONE) tablet 25 mg (25 mg Oral Given 04/13/19 1103)  bisacodyl (DULCOLAX) EC tablet 5 mg (has no administration in time range)  docusate sodium (COLACE) capsule 100 mg (has no administration in time range)  finasteride (PROSCAR) tablet 5 mg (5 mg Oral Given 04/13/19 1104)  tamsulosin (FLOMAX) capsule 0.4 mg (has no administration in time range)  feeding supplement (PRO-STAT SUGAR FREE 64) liquid 30 mL (30 mLs Oral Not Given 04/13/19 1528)  multivitamin with minerals tablet 1 tablet (1 tablet Oral Given 04/13/19 1103)  fluticasone furoate-vilanterol (BREO ELLIPTA) 200-25 MCG/INH 1 puff (1 puff Inhalation Given 04/13/19 1104)  collagenase (SANTYL) ointment 1 application (1 application Topical Given 04/13/19 1104)  latanoprost (XALATAN) 0.005 % ophthalmic solution 1 drop (has no administration in time range)  sodium chloride flush (NS) 0.9 % injection 3 mL (3 mLs Intravenous Given 04/13/19 1123)  sodium chloride flush (NS) 0.9 % injection 3 mL (has no administration in time range)  0.9 %  sodium chloride infusion (has no administration in time range)  acetaminophen (TYLENOL) tablet 650 mg (has no administration in time range)    Or  acetaminophen (TYLENOL) suppository 650 mg (has no administration in time range)  polyethylene glycol (MIRALAX / GLYCOLAX) packet 17 g (has no administration in time range)  bisacodyl (DULCOLAX) suppository 10 mg (has no administration in time range)  ondansetron (ZOFRAN) tablet 4 mg (has no administration in time range)    Or  ondansetron (ZOFRAN) injection 4 mg (has no administration in time range)  torsemide (DEMADEX) tablet 20 mg (has no administration in time range)  0.9 %  sodium chloride infusion ( Intravenous New Bag/Given 04/13/19 1442)    Mobility non-ambulatory High fall risk   Focused Assessments    R Recommendations:  See Admitting Provider Note  Report given to:   Additional Notes:

## 2019-04-13 NOTE — H&P (Signed)
History and Physical    Curtis [name] [id number] DOB: Jul 03, 1945 DOA: 04/12/2019  PCP: [name], MD  Patient coming from: Home    Assessment &Plan:      Principal Problem: Left intertrochanteric femur fracture: Patient presented with a mechanical fall 3 days ago and sustained injury to the left hip.  Has been unable to bear weight on the left side since then.  Found to have a left intertrochanteric hip fracture.  Plan: Patient is being transferred to [facility] health for orthopedic surgery evaluation.  Case was discussed with Dr. Stann Mainland from the orthopedic surgery service. We will keep oral anticoagulation on hold for now Bedrest Pain meds as needed PT/OT evaluation  Other Active Problems:  Mild acute kidney injury: Creatinine 1.5 on presentation.  Baseline appears to be around 1.1. Likely secondary to dehydration, will give gentle IV fluid resuscitation.  Acute on chronic hypoxemic and hypercapnic respiratory failure on home oxygen: Patient has history of asbestosis and is on home oxygen at 4 L nasal cannula at home, oxygenation currently stable at baseline home oxygen Continue oxygen as needed and titrate.  Altered mental status: Likely secondary to hypercapnia, respiratory acidosis. Noted to have a pH of 7.30, PCO2 59, PO2 76.  We will place on BiPAP and titrate. Monitor neurological signs.  History of atrial flutter: Has chronic history of a flutter on Eliquis.  Also recent echocardiogram on 01/02/2019 showed EF of 55 to 60%, no significant valvular heart disease or systolic dysfunction Currently with mild tachycardia Continue metoprolol, digoxin Keep Eliquis on hold Consider heparin drip in a.m.  Coronary artery disease: S/p DES x2 to left circumflex in 2015 Stable, may need cardiology input regarding perioperative risk assessment  History of reflux sympathetic dystrophy: Has chronic physical deconditioning and ambulatory dysfunction secondary to the  same.  Is usually able to ambulate minimally with walker and uses wheelchair. Will need continued PT/OT evaluation  BPH: Continue tamsulosin, finasteride  Chronic normocytic anemia: Monitor CBC  Depression/anxiety: Continue amitriptyline, Xanax as needed  Chronic decubitus ulcers: Has chronic ulcerations on bilateral heels and left buttock.  Wound care as needed  DVT prophylaxis: Eliquis Code Status:  Full code Family Communication: N/A  Disposition Plan: Admitted as inpatient to [facility] campus.  Will need orthopedic surgery evaluation for surgical intervention Consults called: N/A Admission status: Admitted as inpatient   I have personally briefly reviewed patient's old medical records in [place]   HPI:    Chief Complaint: Fall at home   HPI: Curtis [name] is a 73 y.o. male with medical history significant of  AAA, osteoarthritis, asbestosis, CAD, history of NSTEMI, cancer, depression, diverticulosis,/diabetic colitis, GERD, glaucoma, abdominal hernia, hyperlipidemia, hypertension, IBS, peripheral vascular disease, history of pneumonia, reflex sympathetic dystrophy, stage III and IV decubiti ulcers who presented to the ER with left hip pain after a fall.  History is being obtained from the ER record as the patient has altered mental status and is unable to provide a history.  Patient sustained a fall 3 days ago at home.  He was apparently trying to sit down when he missed the chair and fell to the ground.  He normally uses a walker and a wheelchair at home.  As per the family, he had been having severe left hip pain since the fall and was unable to bear any weight on the leg.  The family brought him into the ER today. ED Course:  Vital Signs reviewed on presentation, significant for  temperature 97.9, heart rate 100, blood pressure 118/73, saturation 100% on 4 L nasal cannula. Labs reviewed, significant for sodium 136, potassium 4.9, chloride 97, BUN 39,  creatinine 1.5, BNP 755, troponin 65, lactic acid 1.7, WBC count 14.9, hemoglobin 9.7, hematocrit 32, platelets 341.  Imaging personally Reviewed, chest x-ray shows stable cardiomegaly, stable calcified pleural plaques and chronic pleural effusions.  No evidence of acute findings.  Left hip with pelvis x-ray shows acute mildly displaced intertrochanteric fracture of the proximal left femur. EKG personally reviewed, shows atrial flutter, right bundle branch block, mild tachycardia  Review of Systems: Unable to do review of systems as the patient is altered and is not able to answer questions.  Past Medical History:  Diagnosis Date  . AAA (abdominal aortic aneurysm), stable 04/20/2014  . Arthritis   . 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 CAD (coronary artery disease)    a. s/p cath in 2015 showing occluded RCA and high-grade LCx stenosis treated with DESx2 to LCx  . Cancer ([place])   . Depression   . Diverticulitis    hospital 2011 [facility]  . Diverticulosis   . GERD (gastroesophageal reflux disease)   . 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 Hernia of unspecified site of abdominal cavity without mention of obstruction or gangrene    hiatal  . Hyperlipidemia LDL goal <70 04/20/2014  . Hypertension   . IBS (irritable bowel syndrome)   . Myocardial infarction ([place]) 01/2014   NSTEMI  . Peripheral vascular disease (HCC)    ankle brachial index of 0.79 on the right and 0.61 on the left.   . Pneumonia   . 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 Reflex sympathetic dystrophy   . 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 SOB (shortness of breath)     Past Surgical History:  Procedure Laterality Date  . CARDIAC CATHETERIZATION    . CATARACT EXTRACTION    . CORONARY ANGIOPLASTY    . HERNIA REPAIR    . LEFT HEART CATHETERIZATION WITH CORONARY ANGIOGRAM N/A 01/17/2014   Procedure: LEFT HEART CATHETERIZATION WITH CORONARY ANGIOGRAM;  Surgeon: Leonie Man, MD;  Location: [facility] CATH LAB;  Service: Cardiovascular;   Laterality: N/A;  . left shoulder      x 3  . NISSEN FUNDOPLICATION    . right elbow surgery     x 2  . right knee arthroscopy    . squamous cell skin cancer     Left Hand  . SUPRAVENTRICULAR TACHYCARDIA ABLATION N/A 01/16/2014   Procedure: SUPRAVENTRICULAR TACHYCARDIA ABLATION;  Surgeon: Evans Lance, MD;  Location: [facility] CATH LAB;  Service: Cardiovascular;  Laterality: N/A;     reports that he quit smoking about 13 years ago. His smoking use included cigarettes. He has a 125.00 pack-year smoking history. He quit smokeless tobacco use about 6 years ago. He reports that he does not drink alcohol or use drugs.  Allergies  Allergen Reactions  . Codeine Itching  . Trileptal [Oxcarbazepine]     Other reaction(s): Other (See Comments) Abnormal sodium levels Hyponatremia    Family History  Problem Relation Age of Onset  . Colon cancer Mother   . Colon cancer Maternal Aunt   . Colon cancer Maternal Uncle   . Heart attack Neg Hx   . Stroke Neg Hx    Family history reviewed, noted as above, not pertinent to current presentation.   Prior to Admission medications   Medication Sig Start Date End Date Taking? Authorizing Provider  acetaminophen (TYLENOL) 650 MG CR tablet Take 650 mg by mouth 3 (three) times daily.    [provider]  alprazolam [name]) 2 MG tablet Take 1 tablet (2 mg total) by mouth 2 (two) times daily. 03/29/19   Gerlene Fee, NP  Amino Acids-Protein Hydrolys (FEEDING SUPPLEMENT, PRO-STAT SUGAR FREE 64,) LIQD Take 30 mLs by mouth 2 (two) times daily between meals.     [provider]  amitriptyline (ELAVIL) 100 MG tablet Take 1 tablet (100 mg total) by mouth at bedtime. 03/29/19   Gerlene Fee, NP  apixaban (ELIQUIS) 5 MG TABS tablet Take 1 tablet (5 mg total) by mouth 2 (two) times daily. 03/29/19   Gerlene Fee, NP  Roseanne Kaufman [employer] [facility]) OINT Apply 1 application topically See admin instructions. Apply to coccyx, sacrum, bilateral  buttocks each shift and prn    [provider]  bisacodyl (DULCOLAX) 5 MG EC tablet Take 5 mg by mouth daily as needed for mild constipation or moderate constipation.    [provider]  budesonide-formoterol (SYMBICORT) 160-4.5 MCG/ACT inhaler Inhale 2 puffs into the lungs 2 (two) times daily. 03/29/19   Gerlene Fee, NP  camphor-menthol (ANTI-ITCH) lotion Apply 1 application topically 2 (two) times daily as needed for itching. Sparingly    [provider]  clotrimazole (LOTRIMIN) 1 % cream Instructions: apply to buttock rash three times daily Three Times A Day 09:00 AM, 02:00 PM, 09:00 PM    [provider]  collagenase (SANTYL) ointment Instructions: Apply to left heel per treatment orders.    [provider]  collagenase (SANTYL) ointment Apply 1 application topically daily. Apply to left buttock wound and bilateral heel ulcerations. 03/29/19   Gerlene Fee, NP  digoxin (LANOXIN) 0.125 MG tablet Take 1 tablet (0.125 mg total) by mouth daily. 03/29/19   Gerlene Fee, NP  docusate sodium (COLACE) 100 MG capsule Take 100 mg by mouth daily as needed for mild constipation.    [provider]  finasteride (PROSCAR) 5 MG tablet Take 1 tablet (5 mg total) by mouth every morning. 03/29/19   Gerlene Fee, NP  gabapentin (NEURONTIN) 800 MG tablet Take 1 tablet (800 mg total) by mouth 3 (three) times daily. 03/29/19   Gerlene Fee, NP  guaiFENesin (MUCINEX) 600 MG 12 hr tablet Take 600 mg by mouth 2 (two) times daily.     [provider]  latanoprost (XALATAN) 0.005 % ophthalmic solution Place 1 drop into both eyes at bedtime. 03/29/19   Gerlene Fee, NP  metoprolol tartrate (LOPRESSOR) 25 MG tablet Take 2 tablets (50 mg total) by mouth 2 (two) times daily. 03/29/19   Gerlene Fee, NP  Multiple Vitamins-Minerals (MULTIVITAMIN WITH MINERALS) tablet Take 1 tablet by mouth daily.    [provider]   nitroGLYCERIN (NITROSTAT) 0.4 MG SL tablet Place 1 tablet (0.4 mg total) under the tongue every 5 (five) minutes x 3 doses as needed for chest pain. 03/29/19   Gerlene Fee, NP  NON FORMULARY Diet: Regular, NAS, Consistent Carbohydrate    [provider]  ondansetron (ZOFRAN) 4 MG tablet Take 1 tablet (4 mg total) by mouth every 6 (six) hours as needed for nausea or vomiting. 03/29/19   Gerlene Fee, NP  OXYGEN Place 4 L/min into the nose continuous.     [provider]  spironolactone (ALDACTONE) 25 MG tablet Take 1 tablet (25 mg total) by mouth daily. 03/29/19  Gerlene Fee, NP  tamsulosin (FLOMAX) 0.4 MG CAPS capsule Take 1 capsule (0.4 mg total) by mouth at bedtime. 03/29/19   Gerlene Fee, NP  torsemide (DEMADEX) 20 MG tablet Take 1 tablet (20 mg total) by mouth 2 (two) times daily between meals. 03/29/19   Gerlene Fee, NP  lamoTRIgine (LAMICTAL) 100 MG tablet Take 1 tablet (100 mg total) by mouth 2 (two) times daily. 01/23/16 01/29/16  Kathrynn Ducking, MD    Physical Exam: Vitals:   04/12/19 2330 04/13/19 0000 04/13/19 0030 04/13/19 0100  BP: 105/61 121/60 (!) 115/58 118/73  Pulse: (!) 102 (!) 102 98 99  Resp: (!) 22 (!) 25 (!) 28 (!) 32  Temp:      SpO2: 100% 98% 96% 100%  Weight:      Height:        Constitutional: NAD, calm, comfortable Vitals:   04/12/19 2330 04/13/19 0000 04/13/19 0030 04/13/19 0100  BP: 105/61 121/60 (!) 115/58 118/73  Pulse: (!) 102 (!) 102 98 99  Resp: (!) 22 (!) 25 (!) 28 (!) 32  Temp:      SpO2: 100% 98% 96% 100%  Weight:      Height:       Eyes: PERRL, lids and conjunctivae normal ENMT: Mucous membranes are dry.  Unable to do proper oral exam as the patient is not cooperative.   Neck: normal, supple, no masses, no thyromegaly Respiratory: clear to auscultation bilaterally, no wheezing, no crackles. Normal respiratory effort. No accessory muscle use.  Cardiovascular: Regular rate and rhythm, no murmurs /  rubs / gallops. No extremity edema. 2+ pedal pulses. No carotid bruits.  Abdomen: no tenderness, no masses palpated. No hepatosplenomegaly. Bowel sounds positive.  Musculoskeletal: no clubbing / cyanosis.  Decreased muscle tone bilaterally, left lower extremity is slightly shortened and externally rotated, patient grimaces on movement of the left lower extremity.  Unable to assess sensations. Skin: no rashes, lesions, ulcers. No induration Neurologic: Neurological exam is very limited as the patient is unable to follow commands properly.  Does squeeze fingers but otherwise does not respond.  Does withdraw both upper extremities to pain. Psychiatric: Patient is lethargic, drowsy, not oriented, poor insight and judgment.    Labs on Admission: I have personally reviewed following labs and imaging studies  CBC: Recent Labs  Lab 04/13/19 0027  WBC 14.9*  NEUTROABS 12.0*  HGB 9.7*  HCT 32.9*  MCV 94.5  PLT [id number]   Basic Metabolic Panel: Recent Labs  Lab 04/13/19 0027  NA 136  K 4.9  CL 97*  CO2 28  GLUCOSE 142*  BUN 39*  CREATININE 1.50*  CALCIUM 9.6   GFR: Estimated Creatinine Clearance: 47.4 mL/min (A) (by C-G formula based on SCr of 1.5 mg/dL (H)). Liver Function Tests: No results for input(s): AST, ALT, ALKPHOS, BILITOT, PROT, ALBUMIN in the last 168 hours. No results for input(s): LIPASE, AMYLASE in the last 168 hours. No results for input(s): AMMONIA in the last 168 hours. Coagulation Profile: Recent Labs  Lab 04/13/19 0027  INR 1.3*   Cardiac Enzymes: No results for input(s): CKTOTAL, CKMB, CKMBINDEX, TROPONINI in the last 168 hours. BNP (last 3 results) No results for input(s): PROBNP in the last 8760 hours. HbA1C: No results for input(s): HGBA1C in the last 72 hours. CBG: No results for input(s): GLUCAP in the last 168 hours. Lipid Profile: No results for input(s): CHOL, HDL, LDLCALC, TRIG, CHOLHDL, LDLDIRECT in the last 72 hours. Thyroid Function Tests: No  results for input(s): TSH, T4TOTAL, FREET4, T3FREE, THYROIDAB in the last 72 hours. Anemia Panel: No results for input(s): VITAMINB12, FOLATE, FERRITIN, TIBC, IRON, RETICCTPCT in the last 72 hours. Urine analysis:    Component Value Date/Time   COLORURINE YELLOW 02/08/2019 2240   APPEARANCEUR CLOUDY (A) 02/08/2019 2240   LABSPEC 1.017 02/08/2019 2240   PHURINE 5.0 02/08/2019 2240   GLUCOSEU NEGATIVE 02/08/2019 2240   HGBUR MODERATE (A) 02/08/2019 2240   BILIRUBINUR NEGATIVE 02/08/2019 2240   KETONESUR NEGATIVE 02/08/2019 2240   PROTEINUR 100 (A) 02/08/2019 2240   UROBILINOGEN 0.2 02/12/2015 1859   NITRITE NEGATIVE 02/08/2019 2240   LEUKOCYTESUR LARGE (A) 02/08/2019 2240    Radiological Exams on Admission: Dg Chest Port 1 View  Result Date: 04/13/2019 CLINICAL DATA:  Shortness of breath. EXAM: PORTABLE CHEST 1 VIEW COMPARISON:  Radiograph 02/15/2019. Chest CTA 02/11/2019 FINDINGS: Cardiomegaly with unchanged mediastinal contours. Atherosclerosis of the aortic arch. Bilateral calcified pleural plaques and pleural thickening/small effusions, chronic and not significantly changed. No evidence of acute airspace disease, evaluation slightly limited due to underlying pleural disease. No pneumothorax. No acute osseous abnormalities. IMPRESSION: 1. Stable cardiomegaly. 2. Stable calcified pleural plaques and pleural chronic effusions. 3. No evidence of acute findings, evaluation slightly limited due to underlying pleural disease. Aortic Atherosclerosis (ICD10-I70.0). Electronically Signed   By: Keith Rake M.D.   On: 04/13/2019 00:40   Dg Hip Unilat With Pelvis 2-3 Views Left  Result Date: 04/13/2019 CLINICAL DATA:  Pain EXAM: DG HIP (WITH OR WITHOUT PELVIS) 2-3V LEFT COMPARISON:  None. FINDINGS: There is an acute mildly displaced intratrochanteric fracture of the proximal left femur. There is no dislocation. There is osteopenia. Vascular calcifications are noted. Phleboliths project over the  patient's pelvis. IMPRESSION: Acute mildly displaced intratrochanteric fracture of the proximal left femur. Electronically Signed   By: Constance Holster M.D.   On: 04/13/2019 00:39      Assessment/Plan Active Problems:   Reflex sympathetic dystrophy   COPD with chronic bronchitis (HCC)   GERD   Asbestosis ([place])   HTN (hypertension)   TIA (transient ischemic attack)   SVT (supraventricular tachycardia) s/p ablation 01/16/2014   CAD (coronary artery disease), native coronary artery   Peripheral vascular disease (HCC)   Hyperlipidemia LDL goal <70   Chronic respiratory failure with hypoxia (HCC)   Atrial flutter, chronic (HCC)       Cristina Gong Ginette Otto MD [facility]  If 7PM-7AM, please contact night-coverage   04/13/2019, 1:58 AM

## 2019-04-13 NOTE — Anesthesia Preprocedure Evaluation (Addendum)
Anesthesia Evaluation  Patient identified by MRN, date of birth, ID band Patient awake    Reviewed: Allergy & Precautions, NPO status , Patient's Chart, lab work & pertinent test results  Airway Mallampati: II  TM Distance: >3 FB Neck ROM: Full    Dental no notable dental hx. (+) Teeth Intact, Dental Advisory Given   Pulmonary COPD, former smoker,    Pulmonary exam normal breath sounds clear to auscultation       Cardiovascular hypertension, Pt. on home beta blockers and Pt. on medications + CAD, + Past MI, + Peripheral Vascular Disease and +CHF  Normal cardiovascular exam+ dysrhythmias Atrial Fibrillation  Rhythm:Regular Rate:Normal  EF 55-60%   Neuro/Psych TIA   GI/Hepatic GERD  ,(+) Hepatitis -  Endo/Other  negative endocrine ROS  Renal/GU K+ 4.8     Musculoskeletal   Abdominal   Peds  Hematology  (+) Blood dyscrasia, anemia , Hgb 9.2   Anesthesia Other Findings   Reproductive/Obstetrics                            Anesthesia Physical Anesthesia Plan  ASA: III  Anesthesia Plan: General   Post-op Pain Management:    Induction: Intravenous  PONV Risk Score and Plan: Treatment may vary due to age or medical condition, Ondansetron and Dexamethasone  Airway Management Planned: Oral ETT  Additional Equipment: None  Intra-op Plan:   Post-operative Plan: Extubation in OR  Informed Consent: I have reviewed the patients History and Physical, chart, labs and discussed the procedure including the risks, benefits and alternatives for the proposed anesthesia with the patient or authorized representative who has indicated his/her understanding and acceptance.     Dental advisory given  Plan Discussed with:   Anesthesia Plan Comments:        Anesthesia Quick Evaluation

## 2019-04-13 NOTE — ED Notes (Signed)
Called [facility] for transport to [facility] after getting a bed assignment.  Informed, "it may be awhile".  Pt's nurse informed.

## 2019-04-13 NOTE — ED Notes (Signed)
pericare and new sacrum dressing applied, wound is approximately 1/2 inch in depth , full thickness

## 2019-04-14 ENCOUNTER — Inpatient Hospital Stay (HOSPITAL_COMMUNITY): Payer: Medicare Other

## 2019-04-14 ENCOUNTER — Inpatient Hospital Stay (HOSPITAL_COMMUNITY): Payer: Medicare Other | Admitting: Anesthesiology

## 2019-04-14 ENCOUNTER — Encounter (HOSPITAL_COMMUNITY)
Admission: EM | Disposition: A | Discharge status: Skilled Nursing Facility | Payer: Self-pay | Source: Home / Self Care | Attending: Internal Medicine

## 2019-04-14 ENCOUNTER — Encounter (HOSPITAL_COMMUNITY): Payer: Self-pay | Admitting: Family Medicine

## 2019-04-14 DIAGNOSIS — I2510 Atherosclerotic heart disease of native coronary artery without angina pectoris: Secondary | ICD-10-CM

## 2019-04-14 DIAGNOSIS — J449 Chronic obstructive pulmonary disease, unspecified: Secondary | ICD-10-CM

## 2019-04-14 DIAGNOSIS — G9050 Complex regional pain syndrome I, unspecified: Secondary | ICD-10-CM

## 2019-04-14 DIAGNOSIS — I739 Peripheral vascular disease, unspecified: Secondary | ICD-10-CM

## 2019-04-14 DIAGNOSIS — J61 Pneumoconiosis due to asbestos and other mineral fibers: Secondary | ICD-10-CM

## 2019-04-14 DIAGNOSIS — J9611 Chronic respiratory failure with hypoxia: Secondary | ICD-10-CM

## 2019-04-14 DIAGNOSIS — I10 Essential (primary) hypertension: Secondary | ICD-10-CM

## 2019-04-14 DIAGNOSIS — K219 Gastro-esophageal reflux disease without esophagitis: Secondary | ICD-10-CM

## 2019-04-14 DIAGNOSIS — E785 Hyperlipidemia, unspecified: Secondary | ICD-10-CM

## 2019-04-14 HISTORY — PX: INTRAMEDULLARY (IM) NAIL INTERTROCHANTERIC: SHX5875

## 2019-04-14 LAB — CBC
HCT: 31.1 % — ABNORMAL LOW (ref 39.0–52.0)
Hemoglobin: 9.4 g/dL — ABNORMAL LOW (ref 13.0–17.0)
MCH: 27.8 pg (ref 26.0–34.0)
MCHC: 30.2 g/dL (ref 30.0–36.0)
MCV: 92 fL (ref 80.0–100.0)
Platelets: 338 10*3/uL (ref 150–400)
RBC: 3.38 MIL/uL — ABNORMAL LOW (ref 4.22–5.81)
RDW: 16.5 % — ABNORMAL HIGH (ref 11.5–15.5)
WBC: 11 10*3/uL — ABNORMAL HIGH (ref 4.0–10.5)
nRBC: 0.5 % — ABNORMAL HIGH (ref 0.0–0.2)

## 2019-04-14 LAB — SURGICAL PCR SCREEN
MRSA, PCR: POSITIVE — AB
Staphylococcus aureus: POSITIVE — AB

## 2019-04-14 LAB — CREATININE, SERUM
Creatinine, Ser: 1.04 mg/dL (ref 0.61–1.24)
GFR calc Af Amer: >60 mL/min (ref >60)
GFR calc non Af Amer: >60 mL/min (ref >60)

## 2019-04-14 SURGERY — FIXATION, FRACTURE, INTERTROCHANTERIC, WITH INTRAMEDULLARY ROD
Anesthesia: General | Laterality: Left

## 2019-04-14 MED ORDER — PROPOFOL 10 MG/ML IV BOLUS
INTRAVENOUS | Status: AC
  Filled 2019-04-14: qty 20

## 2019-04-14 MED ORDER — ROCURONIUM BROMIDE 100 MG/10ML IV SOLN
INTRAVENOUS | Status: DC | PRN
  Administered 2019-04-14: 50 mg via INTRAVENOUS

## 2019-04-14 MED ORDER — DEXAMETHASONE SODIUM PHOSPHATE 10 MG/ML IJ SOLN
INTRAMUSCULAR | Status: AC
  Filled 2019-04-14: qty 1

## 2019-04-14 MED ORDER — FENTANYL CITRATE (PF) 100 MCG/2ML IJ SOLN
INTRAMUSCULAR | Status: DC | PRN
  Administered 2019-04-14 (×2): 50 ug via INTRAVENOUS

## 2019-04-14 MED ORDER — PHENOL 1.4 % MT LIQD: 1.0000 | OROMUCOSAL | Status: DC | PRN

## 2019-04-14 MED ORDER — TRANEXAMIC ACID-NACL 1000-0.7 MG/100ML-% IV SOLN
1000.0000 mg | INTRAVENOUS | Status: AC
  Administered 2019-04-14: 1000 mg via INTRAVENOUS

## 2019-04-14 MED ORDER — ONDANSETRON HCL 4 MG PO TABS
4.0000 mg | ORAL_TABLET | Freq: Four times a day (QID) | ORAL | Status: DC | PRN
  Administered 2019-04-19: 4 mg via ORAL
  Filled 2019-04-14: qty 1

## 2019-04-14 MED ORDER — DOCUSATE SODIUM 100 MG PO CAPS
100.0000 mg | ORAL_CAPSULE | Freq: Two times a day (BID) | ORAL | Status: DC
  Administered 2019-04-14 – 2019-04-19 (×7): 100 mg via ORAL
  Filled 2019-04-14 (×10): qty 1

## 2019-04-14 MED ORDER — CEFAZOLIN SODIUM-DEXTROSE 2-4 GM/100ML-% IV SOLN
2.0000 g | INTRAVENOUS | Status: AC
  Administered 2019-04-14: 2 g via INTRAVENOUS

## 2019-04-14 MED ORDER — CEFAZOLIN SODIUM-DEXTROSE 2-4 GM/100ML-% IV SOLN: 2.0000 g | INTRAVENOUS | Status: DC

## 2019-04-14 MED ORDER — ACETAMINOPHEN 10 MG/ML IV SOLN: 1000.0000 mg | Freq: Once | INTRAVENOUS | Status: DC | PRN

## 2019-04-14 MED ORDER — ENOXAPARIN SODIUM 40 MG/0.4ML ~~LOC~~ SOLN
40.0000 mg | SUBCUTANEOUS | Status: DC
  Administered 2019-04-15 – 2019-04-19 (×5): 40 mg via SUBCUTANEOUS
  Filled 2019-04-14 (×5): qty 0.4

## 2019-04-14 MED ORDER — FENTANYL CITRATE (PF) 100 MCG/2ML IJ SOLN
25.0000 ug | INTRAMUSCULAR | Status: DC | PRN
  Administered 2019-04-14 (×2): 25 ug via INTRAVENOUS

## 2019-04-14 MED ORDER — TRANEXAMIC ACID-NACL 1000-0.7 MG/100ML-% IV SOLN
INTRAVENOUS | Status: AC
  Filled 2019-04-14: qty 100

## 2019-04-14 MED ORDER — VANCOMYCIN HCL IN DEXTROSE 1-5 GM/200ML-% IV SOLN
1000.0000 mg | Freq: Two times a day (BID) | INTRAVENOUS | Status: AC
  Administered 2019-04-15: 01:00:00 1000 mg via INTRAVENOUS
  Filled 2019-04-14: qty 200

## 2019-04-14 MED ORDER — FENTANYL CITRATE (PF) 250 MCG/5ML IJ SOLN
INTRAMUSCULAR | Status: AC
  Filled 2019-04-14: qty 5

## 2019-04-14 MED ORDER — ROCURONIUM BROMIDE 10 MG/ML (PF) SYRINGE
PREFILLED_SYRINGE | INTRAVENOUS | Status: AC
  Filled 2019-04-14: qty 10

## 2019-04-14 MED ORDER — PHENYLEPHRINE 40 MCG/ML (10ML) SYRINGE FOR IV PUSH (FOR BLOOD PRESSURE SUPPORT)
PREFILLED_SYRINGE | INTRAVENOUS | Status: AC
  Filled 2019-04-14: qty 10

## 2019-04-14 MED ORDER — CEFAZOLIN SODIUM-DEXTROSE 2-4 GM/100ML-% IV SOLN
INTRAVENOUS | Status: AC
  Filled 2019-04-14: qty 100

## 2019-04-14 MED ORDER — ONDANSETRON HCL 4 MG/2ML IJ SOLN: 4.0000 mg | Freq: Once | INTRAMUSCULAR | Status: DC | PRN

## 2019-04-14 MED ORDER — ONDANSETRON HCL 4 MG/2ML IJ SOLN
INTRAMUSCULAR | Status: AC
  Filled 2019-04-14: qty 2

## 2019-04-14 MED ORDER — POVIDONE-IODINE 10 % EX SWAB: 2.0000 "application " | Freq: Once | CUTANEOUS | Status: DC

## 2019-04-14 MED ORDER — VANCOMYCIN HCL IN DEXTROSE 1-5 GM/200ML-% IV SOLN
INTRAVENOUS | Status: AC
  Administered 2019-04-14: 13:00:00 1000 mg via INTRAVENOUS
  Filled 2019-04-14: qty 200

## 2019-04-14 MED ORDER — CHLORHEXIDINE GLUCONATE 4 % EX LIQD
60.0000 mL | Freq: Once | CUTANEOUS | Status: DC
  Administered 2019-04-14: 4 via TOPICAL

## 2019-04-14 MED ORDER — DEXAMETHASONE SODIUM PHOSPHATE 10 MG/ML IJ SOLN
INTRAMUSCULAR | Status: DC | PRN
  Administered 2019-04-14: 5 mg via INTRAVENOUS

## 2019-04-14 MED ORDER — ENSURE PRE-SURGERY PO LIQD
296.0000 mL | Freq: Once | ORAL | Status: DC
  Filled 2019-04-14: qty 296

## 2019-04-14 MED ORDER — VANCOMYCIN HCL IN DEXTROSE 1-5 GM/200ML-% IV SOLN: 1000.0000 mg | Freq: Once | INTRAVENOUS | Status: AC

## 2019-04-14 MED ORDER — ONDANSETRON HCL 4 MG/2ML IJ SOLN
4.0000 mg | Freq: Four times a day (QID) | INTRAMUSCULAR | Status: DC | PRN
  Filled 2019-04-14: qty 2

## 2019-04-14 MED ORDER — LIDOCAINE 2% (20 MG/ML) 5 ML SYRINGE
INTRAMUSCULAR | Status: AC
  Filled 2019-04-14: qty 5

## 2019-04-14 MED ORDER — METOCLOPRAMIDE HCL 5 MG/ML IJ SOLN: 5.0000 mg | Freq: Three times a day (TID) | INTRAMUSCULAR | Status: DC | PRN

## 2019-04-14 MED ORDER — PROPOFOL 10 MG/ML IV BOLUS
INTRAVENOUS | Status: DC | PRN
  Administered 2019-04-14: 120 mg via INTRAVENOUS

## 2019-04-14 MED ORDER — METOCLOPRAMIDE HCL 5 MG PO TABS: 5.0000 mg | ORAL_TABLET | Freq: Three times a day (TID) | ORAL | Status: DC | PRN

## 2019-04-14 MED ORDER — FENTANYL CITRATE (PF) 100 MCG/2ML IJ SOLN
INTRAMUSCULAR | Status: AC
  Filled 2019-04-14: qty 2

## 2019-04-14 MED ORDER — PHENYLEPHRINE HCL-NACL 10-0.9 MG/250ML-% IV SOLN
INTRAVENOUS | Status: DC | PRN
  Administered 2019-04-14: 25 ug/min via INTRAVENOUS

## 2019-04-14 MED ORDER — MUPIROCIN 2 % EX OINT
1.0000 "application " | TOPICAL_OINTMENT | Freq: Two times a day (BID) | CUTANEOUS | Status: AC
  Administered 2019-04-14 – 2019-04-18 (×10): 1 via NASAL
  Filled 2019-04-14 (×2): qty 22

## 2019-04-14 MED ORDER — MENTHOL 3 MG MT LOZG: 1.0000 | LOZENGE | OROMUCOSAL | Status: DC | PRN

## 2019-04-14 MED ORDER — PHENYLEPHRINE 40 MCG/ML (10ML) SYRINGE FOR IV PUSH (FOR BLOOD PRESSURE SUPPORT)
PREFILLED_SYRINGE | INTRAVENOUS | Status: DC | PRN
  Administered 2019-04-14: 80 ug via INTRAVENOUS

## 2019-04-14 MED ORDER — ACETAMINOPHEN 325 MG PO TABS
650.0000 mg | ORAL_TABLET | Freq: Four times a day (QID) | ORAL | Status: DC | PRN
  Administered 2019-04-14: 22:00:00 650 mg via ORAL
  Filled 2019-04-14: qty 2

## 2019-04-14 MED ORDER — SUGAMMADEX SODIUM 200 MG/2ML IV SOLN
INTRAVENOUS | Status: DC | PRN
  Administered 2019-04-14: 200 mg via INTRAVENOUS

## 2019-04-14 MED ORDER — 0.9 % SODIUM CHLORIDE (POUR BTL) OPTIME
TOPICAL | Status: DC | PRN
  Administered 2019-04-14: 1000 mL

## 2019-04-14 MED ORDER — LACTATED RINGERS IV SOLN
INTRAVENOUS | Status: DC
  Administered 2019-04-14: 13:00:00 via INTRAVENOUS

## 2019-04-14 MED ORDER — CHLORHEXIDINE GLUCONATE CLOTH 2 % EX PADS
6.0000 | MEDICATED_PAD | Freq: Every day | CUTANEOUS | Status: DC
  Administered 2019-04-14: 10:00:00 6 via TOPICAL

## 2019-04-14 MED ORDER — CHLORHEXIDINE GLUCONATE CLOTH 2 % EX PADS
6.0000 | MEDICATED_PAD | Freq: Every day | CUTANEOUS | Status: DC
  Administered 2019-04-15 – 2019-04-19 (×5): 6 via TOPICAL

## 2019-04-14 SURGICAL SUPPLY — 52 items
ADH SKN CLS APL DERMABOND .7 (GAUZE/BANDAGES/DRESSINGS) ×1
ALCOHOL 70% 16 OZ (MISCELLANEOUS) ×3 IMPLANT
APL PRP STRL LF DISP 70% ISPRP (MISCELLANEOUS) ×1
BIT DRILL AO GAMMA 4.2X340 (BIT) ×2 IMPLANT
BNDG COHESIVE 4X5 TAN STRL (GAUZE/BANDAGES/DRESSINGS) IMPLANT
CHLORAPREP W/TINT 26 (MISCELLANEOUS) ×3 IMPLANT
COVER PERINEAL POST (MISCELLANEOUS) ×3 IMPLANT
COVER SURGICAL LIGHT HANDLE (MISCELLANEOUS) ×3 IMPLANT
COVER WAND RF STERILE (DRAPES) ×3 IMPLANT
DERMABOND ADVANCED (GAUZE/BANDAGES/DRESSINGS) ×2
DERMABOND ADVANCED .7 DNX12 (GAUZE/BANDAGES/DRESSINGS) ×2 IMPLANT
DRAPE C-ARM 42X72 X-RAY (DRAPES) ×3 IMPLANT
DRAPE C-ARMOR (DRAPES) ×3 IMPLANT
DRAPE HALF SHEET 40X57 (DRAPES) ×3 IMPLANT
DRAPE IMP U-DRAPE 54X76 (DRAPES) ×6 IMPLANT
DRAPE STERI IOBAN 125X83 (DRAPES) ×3 IMPLANT
DRAPE U-SHAPE 47X51 STRL (DRAPES) ×6 IMPLANT
DRSG MEPILEX BORDER 4X4 (GAUZE/BANDAGES/DRESSINGS) ×4 IMPLANT
DRSG MEPILEX BORDER 4X8 (GAUZE/BANDAGES/DRESSINGS) ×2 IMPLANT
DRSG PAD ABDOMINAL 8X10 ST (GAUZE/BANDAGES/DRESSINGS) IMPLANT
ELECT REM PT RETURN 9FT ADLT (ELECTROSURGICAL)
ELECTRODE REM PT RTRN 9FT ADLT (ELECTROSURGICAL) ×1 IMPLANT
FACESHIELD WRAPAROUND (MASK) IMPLANT
FACESHIELD WRAPAROUND OR TEAM (MASK) ×1 IMPLANT
GLOVE BIO SURGEON STRL SZ8.5 (GLOVE) ×6 IMPLANT
GLOVE BIOGEL PI IND STRL 8.5 (GLOVE) ×1 IMPLANT
GLOVE BIOGEL PI INDICATOR 8.5 (GLOVE) ×2
GOWN STRL REUS W/ TWL LRG LVL3 (GOWN DISPOSABLE) ×2 IMPLANT
GOWN STRL REUS W/TWL 2XL LVL3 (GOWN DISPOSABLE) ×3 IMPLANT
GOWN STRL REUS W/TWL LRG LVL3 (GOWN DISPOSABLE) ×6
K-WIRE  3.2X450M STR (WIRE) ×4
K-WIRE 3.2X450M STR (WIRE) ×2
KIT TURNOVER KIT B (KITS) ×3 IMPLANT
KWIRE 3.2X450M STR (WIRE) IMPLANT
MANIFOLD NEPTUNE II (INSTRUMENTS) ×1 IMPLANT
MARKER SKIN DUAL TIP RULER LAB (MISCELLANEOUS) ×3 IMPLANT
NAIL TROCH GAMMA 11X18 (Nail) ×2 IMPLANT
NS IRRIG 1000ML POUR BTL (IV SOLUTION) ×3 IMPLANT
PACK GENERAL/GYN (CUSTOM PROCEDURE TRAY) ×3 IMPLANT
PACK UNIVERSAL I (CUSTOM PROCEDURE TRAY) ×3 IMPLANT
PAD ARMBOARD 7.5X6 YLW CONV (MISCELLANEOUS) ×4 IMPLANT
PADDING CAST ABS 4INX4YD NS (CAST SUPPLIES)
PADDING CAST ABS COTTON 4X4 ST (CAST SUPPLIES) IMPLANT
SCREW LAG GAMMA 3 110MM (Screw) ×2 IMPLANT
SCREW LOCKING T2 F/T  5MMX45MM (Screw) ×2 IMPLANT
SCREW LOCKING T2 F/T 5MMX45MM (Screw) IMPLANT
SUT MNCRL AB 3-0 PS2 27 (SUTURE) ×3 IMPLANT
SUT MON AB 2-0 CT1 27 (SUTURE) ×3 IMPLANT
SUT VIC AB 1 CT1 27 (SUTURE) ×3
SUT VIC AB 1 CT1 27XBRD ANBCTR (SUTURE) ×1 IMPLANT
TOWEL GREEN STERILE (TOWEL DISPOSABLE) ×3 IMPLANT
TOWEL GREEN STERILE FF (TOWEL DISPOSABLE) ×3 IMPLANT

## 2019-04-14 NOTE — Progress Notes (Signed)
PROGRESS NOTE    KAYLOR BORT  [id number] DOB: May 15, 1945 DOA: 04/12/2019 PCP: [name], MD   Brief Narrative:  HPI on 04/13/2019 by Dr. Vivia Ewing EKAM DERSTINE is a 73 y.o. male with medical history significant of AAA, osteoarthritis, asbestosis, CAD, history of NSTEMI, cancer, depression, diverticulosis,/diabetic colitis, GERD, glaucoma, abdominal hernia, hyperlipidemia, hypertension, IBS, peripheral vascular disease, history of pneumonia, reflex sympathetic dystrophy, stage III and IV decubiti ulcers who presented to the ER with left hip pain after a fall.  History is being obtained from the ER record as the patient has altered mental status and is unable to provide a history.  Patient sustained a fall 3 days ago at home.  He was apparently trying to sit down when he missed the chair and fell to the ground.  He normally uses a walker and a wheelchair at home.  As per the family, he had been having severe left hip pain since the fall and was unable to bear any weight on the leg.  The family brought him into the ER today.  Assessment & Plan   Left intertrochanteric femur fracture -Status post mechanical fall 3 days prior to admission.  Patient has been unable to bear weight on the left side. 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 -Patient with history of asbestosis -On home oxygen 4 L -Appears stable -CXR reviewed, and unremarkable for infection  Acute metabolic encephalopathy -Appears to have resolved -Thought to be secondary to hypercapnia, respiratory acidosis -pH 7.3, PCO2 59, PO2 76 on admission -Patient was placed on BiPAP  Atrial flutter, chronic -The patient has been taking Eliquis at  home which is been held for possible surgery -Echocardiogram 01/02/2019 showed an EF of 55 to 60%, no significant valvular disease or systolic dysfunction -Continue metoprolol, digoxin  Coronary artery disease -Status post DES x2 to L circumflex in 2015 -Currently stable, no complaints of chest pain  History of reflux sympathetic dystrophy -Chronic physical deconditioning amatory dysfunction secondary to the above -At baseline, patient is able to ambulate with the use of a walker and uses a wheelchair -PT and OT consulted  BPH -Continue tamsulosin, finasteride  Chronic normocytic anemia -Hemoglobin currently 9.2, appears to be stable at baseline -Continue to monitor CBC  Depression/anxiety -Continue amitriptyline, Xanax as needed  Chronic decubitus ulcers -Present on admission, patient has chronic ulcerations on bilateral heels as well as left buttock -Wound care as needed  DVT Prophylaxis  SCDs  Code Status: Full  Family Communication: None at bedside  Disposition Plan: Admitted.  Pending surgery later today.  Disposition to be determined  Consultants Orthopedic surgery  Procedures  None  Antibiotics   Anti-infectives (From admission, onward)   None      Subjective:   [name] seen and examined today.  Patient does complain of left hip pain.  Denies current chest pain, shortness of breath, abdominal pain, nausea or vomiting, diarrhea constipation, dizziness or headache.  Has chronic cough.  Objective:   Vitals:   04/14/19 0616 04/14/19 0813 04/14/19 1010 04/14/19 1014  BP:  125/74  125/74  Pulse:  100 100 100  Resp:  16    Temp:  98 F (36.7 C)    TempSrc:  Oral    SpO2:  94%  Weight: 81 kg     Height:        Intake/Output Summary (Last 24 hours) at 04/14/2019 1206 Last data filed at 04/14/2019 0900 Gross per 24 hour  Intake 240 ml  Output 500 ml  Net -260 ml   Filed Weights   04/12/19 2252 04/14/19 0616  Weight: 82.1 kg 81 kg     Exam  General: Well developed, well nourished, NAD, appears stated age  HEENT: NCAT, mucous membranes moist.   Cardiovascular: S1 S2 auscultated, irregular  Respiratory: Clear to auscultation bilaterally, cough  Abdomen: Soft, nontender, nondistended, + bowel sounds  Extremities: warm dry without cyanosis clubbing or edema of lower extremities.  Left hip TTP  Neuro: AAOx3, nonfocal  Psych: Appropriate mood and affect, pleasant   Data Reviewed: I have personally reviewed following labs and imaging studies  CBC: Recent Labs  Lab 04/13/19 0027 04/13/19 0650  WBC 14.9* 13.5*  NEUTROABS 12.0*  --   HGB 9.7* 9.2*  HCT 32.9* 31.7*  MCV 94.5 95.5  PLT 341 [id number]   Basic Metabolic Panel: Recent Labs  Lab 04/13/19 0027 04/13/19 0650  NA 136 137  K 4.9 4.8  CL 97* 98  CO2 28 29  GLUCOSE 142* 155*  BUN 39* 40*  CREATININE 1.50* 1.31*  CALCIUM 9.6 9.6   GFR: Estimated Creatinine Clearance: 54.3 mL/min (A) (by C-G formula based on SCr of 1.31 mg/dL (H)). Liver Function Tests: No results for input(s): AST, ALT, ALKPHOS, BILITOT, PROT, ALBUMIN in the last 168 hours. No results for input(s): LIPASE, AMYLASE in the last 168 hours. No results for input(s): AMMONIA in the last 168 hours. Coagulation Profile: Recent Labs  Lab 04/13/19 0027  INR 1.3*   Cardiac Enzymes: No results for input(s): CKTOTAL, CKMB, CKMBINDEX, TROPONINI in the last 168 hours. BNP (last 3 results) No results for input(s): PROBNP in the last 8760 hours. HbA1C: No results for input(s): HGBA1C in the last 72 hours. CBG: No results for input(s): GLUCAP in the last 168 hours. Lipid Profile: No results for input(s): CHOL, HDL, LDLCALC, TRIG, CHOLHDL, LDLDIRECT in the last 72 hours. Thyroid Function Tests: No results for input(s): TSH, T4TOTAL, FREET4, T3FREE, THYROIDAB in the last 72 hours. Anemia Panel: No results for input(s): VITAMINB12, FOLATE, FERRITIN, TIBC, IRON, RETICCTPCT in the last 72  hours. Urine analysis:    Component Value Date/Time   COLORURINE YELLOW 02/08/2019 2240   APPEARANCEUR CLOUDY (A) 02/08/2019 2240   LABSPEC 1.017 02/08/2019 2240   PHURINE 5.0 02/08/2019 2240   GLUCOSEU NEGATIVE 02/08/2019 2240   HGBUR MODERATE (A) 02/08/2019 2240   BILIRUBINUR NEGATIVE 02/08/2019 2240   KETONESUR NEGATIVE 02/08/2019 2240   PROTEINUR 100 (A) 02/08/2019 2240   UROBILINOGEN 0.2 02/12/2015 1859   NITRITE NEGATIVE 02/08/2019 2240   LEUKOCYTESUR LARGE (A) 02/08/2019 2240   Sepsis Labs: @LABRCNTIP (procalcitonin:4,lacticidven:4)  ) Recent Results (from the past 240 hour(s))  SARS CORONAVIRUS 2 (TAT 6-24 HRS) Nasopharyngeal Nasopharyngeal Swab     Status: None   Collection Time: 04/13/19  2:13 AM   Specimen: Nasopharyngeal Swab  Result Value Ref Range Status   SARS Coronavirus 2 NEGATIVE NEGATIVE Final    Comment: (NOTE) SARS-CoV-2 target nucleic acids are NOT DETECTED. The SARS-CoV-2 RNA is generally detectable in upper and lower respiratory specimens during the acute phase of infection. Negative results do not preclude SARS-CoV-2 infection, do not rule out co-infections with other pathogens, and should not be used as the sole basis for treatment or other patient management  decisions. Negative results must be combined with clinical observations, patient history, and epidemiological information. The expected result is Negative. Fact Sheet for Patients: SugarRoll.be Fact Sheet for Healthcare Providers: https://www.woods-mathews.com/ This test is not yet approved or cleared by the [place] FDA and  has been authorized for detection and/or diagnosis of SARS-CoV-2 by FDA under an Emergency Use Authorization (EUA). This EUA will remain  in effect (meaning this test can be used) for the duration of the COVID-19 declaration under Section 56 4(b)(1) of the Act, 21 U.S.C. section 360bbb-3(b)(1), unless the authorization is  terminated or revoked sooner. Performed at [facility], [place] [address], [place], [place] [address]   Surgical pcr screen     Status: Abnormal   Collection Time: 04/14/19 12:06 AM   Specimen: Nasal Mucosa; Nasal Swab  Result Value Ref Range Status   MRSA, PCR POSITIVE (A) NEGATIVE Final   Staphylococcus aureus POSITIVE (A) NEGATIVE Final    Comment: RESULT CALLED TO, READ BACK BY AND VERIFIED WITHRex Kras RN 04/14/19 [id number] (NOTE) The Xpert SA Assay (FDA approved for NASAL specimens in patients 50 years of age and older), is one component of a comprehensive surveillance program. It is not intended to diagnose infection nor to guide or monitor treatment. Performed at [facility], [place] [address], [place], [place] [address]       Radiology Studies: DG Chest Port 1 View  Result Date: 04/13/2019 CLINICAL DATA:  Shortness of breath. EXAM: PORTABLE CHEST 1 VIEW COMPARISON:  Radiograph 02/15/2019. Chest CTA 02/11/2019 FINDINGS: Cardiomegaly with unchanged mediastinal contours. Atherosclerosis of the aortic arch. Bilateral calcified pleural plaques and pleural thickening/small effusions, chronic and not significantly changed. No evidence of acute airspace disease, evaluation slightly limited due to underlying pleural disease. No pneumothorax. No acute osseous abnormalities. IMPRESSION: 1. Stable cardiomegaly. 2. Stable calcified pleural plaques and pleural chronic effusions. 3. No evidence of acute findings, evaluation slightly limited due to underlying pleural disease. Aortic Atherosclerosis (ICD10-I70.0). Electronically Signed   By: Keith Rake M.D.   On: 04/13/2019 00:40   DG Knee Left Port  Result Date: 04/13/2019 CLINICAL DATA:  Fall, known left hip fracture EXAM: PORTABLE LEFT KNEE - 1-2 VIEW COMPARISON:  None. FINDINGS: No fracture or dislocation is seen. Mild tricompartmental degenerative changes. Visualized soft tissues are within normal limits. Vascular  calcifications. No suprapatellar knee joint effusion. IMPRESSION: Negative. Electronically Signed   By: Julian Hy M.D.   On: 04/13/2019 08:22   DG HIP UNILAT WITH PELVIS 2-3 VIEWS LEFT  Result Date: 04/13/2019 CLINICAL DATA:  Pain EXAM: DG HIP (WITH OR WITHOUT PELVIS) 2-3V LEFT COMPARISON:  None. FINDINGS: There is an acute mildly displaced intratrochanteric fracture of the proximal left femur. There is no dislocation. There is osteopenia. Vascular calcifications are noted. Phleboliths project over the patient's pelvis. IMPRESSION: Acute mildly displaced intratrochanteric fracture of the proximal left femur. Electronically Signed   By: Constance Holster M.D.   On: 04/13/2019 00:39     Scheduled Meds:  Chlorhexidine Gluconate Cloth  6 each Topical Daily   collagenase  1 application Topical Daily   digoxin  0.125 mg Oral Daily   feeding supplement (PRO-STAT SUGAR FREE 64)  30 mL Oral BID BM   finasteride  5 mg Oral Daily   fluticasone furoate-vilanterol  1 puff Inhalation Daily    HYDROmorphone (DILAUDID) injection  0.5 mg Intravenous Once   latanoprost  1 drop Both Eyes QHS   metoprolol tartrate  25  mg Oral BID   multivitamin with minerals  1 tablet Oral Daily   mupirocin ointment  1 application Nasal BID   sodium chloride flush  3 mL Intravenous Q12H   spironolactone  25 mg Oral Daily   tamsulosin  0.4 mg Oral QHS   torsemide  20 mg Oral BID BM   Continuous Infusions:  sodium chloride     sodium chloride 30 mL/hr at 04/13/19 2317     LOS: 1 day   Time Spent in minutes   45 minutes  Santanna Olenik D.O. on 04/14/2019 at 12:06 PM  Between 7am to 7pm - Please see pager noted on [email or web address]  After 7pm go to [email or web address]  And look for the night coverage person covering for me after hours  [facility] Office  [phone]

## 2019-04-14 NOTE — Anesthesia Postprocedure Evaluation (Signed)
Anesthesia Post Note  Patient: Curtis [name]  Procedure(s) Performed: INTRAMEDULLARY (IM) NAIL INTERTROCHANTRIC (Left )     Patient location during evaluation: PACU Anesthesia Type: General Level of consciousness: awake and alert Pain management: pain level controlled Vital Signs Assessment: post-procedure vital signs reviewed and stable Respiratory status: spontaneous breathing, nonlabored ventilation, respiratory function stable and patient connected to nasal cannula oxygen Cardiovascular status: blood pressure returned to baseline and stable Postop Assessment: no apparent nausea or vomiting Anesthetic complications: no    Last Vitals:  Vitals:   04/14/19 1615 04/14/19 1630  BP: 124/76 130/64  Pulse: (!) 123 89  Resp: 20 17  Temp:  36.9 C  SpO2: 97% 100%    Last Pain:  Vitals:   04/14/19 1625  TempSrc:   PainSc: 7     LLE Motor Response: Purposeful movement;Responds to commands (04/14/19 1630) LLE Sensation: Full sensation (04/14/19 1630)          Barnet Glasgow

## 2019-04-14 NOTE — Transfer of Care (Signed)
Immediate Anesthesia Transfer of Care Note  Patient: Curtis [name]  Procedure(s) Performed: INTRAMEDULLARY (IM) NAIL INTERTROCHANTRIC (Left )  Patient Location: PACU  Anesthesia Type:General  Level of Consciousness: drowsy  Airway & Oxygen Therapy: Patient Spontanous Breathing and Patient connected to face mask oxygen  Post-op Assessment: Report given to RN and Post -op Vital signs reviewed and stable  Post vital signs: Reviewed and stable  Last Vitals:  Vitals Value Taken Time  BP 119/53 04/14/19 1530  Temp 36.9 C 04/14/19 1530  Pulse 90 04/14/19 1533  Resp 16 04/14/19 1533  SpO2 94 % 04/14/19 1533  Vitals shown include unvalidated device data.  Last Pain:  Vitals:   04/14/19 0813  TempSrc: Oral  PainSc:       Patients Stated Pain Goal: 0 ([id number])  Complications: No apparent anesthesia complications

## 2019-04-14 NOTE — Discharge Instructions (Signed)
 Dr. Patryk Conant Adult Hip & Knee Specialist [facility] [address] [place], [place] [address] [phone]   POSTOPERATIVE DIRECTIONS    Hip Rehabilitation, Guidelines Following Surgery   WEIGHT BEARING Weight bearing as tolerated with assist device (walker, cane, etc) as directed, use it as long as suggested by your surgeon or therapist, typically at least 4-6 weeks.   HOME CARE INSTRUCTIONS  Remove items at home which could result in a fall. This includes throw rugs or furniture in walking pathways.  Continue medications as instructed at time of discharge.  You may have some home medications which will be placed on hold until you complete the course of blood thinner medication.  4 days after discharge, you may start showering. No tub baths or soaking your incisions. Do not put on socks or shoes without following the instructions of your caregivers.   Sit on chairs with arms. Use the chair arms to help push yourself up when arising.  Arrange for the use of a toilet seat elevator so you are not sitting low.   Walk with walker as instructed.  You may resume a sexual relationship in one month or when given the OK by your caregiver.  Use walker as long as suggested by your caregivers.  Avoid periods of inactivity such as sitting longer than an hour when not asleep. This helps prevent blood clots.  You may return to work once you are cleared by your surgeon.  Do not drive a car for 6 weeks or until released by your surgeon.  Do not drive while taking narcotics.  Wear elastic stockings for two weeks following surgery during the day but you may remove then at night.  Make sure you keep all of your appointments after your operation with all of your doctors and caregivers. You should call the office at the above phone number and make an appointment for approximately two weeks after the date of your surgery. Please pick up a stool softener and laxative  for home use as long as you are requiring pain medications.  ICE to the affected hip every three hours for 30 minutes at a time and then as needed for pain and swelling. Continue to use ice on the hip for pain and swelling from surgery. You may notice swelling that will progress down to the foot and ankle.  This is normal after surgery.  Elevate the leg when you are not up walking on it.   It is important for you to complete the blood thinner medication as prescribed by your doctor.  Continue to use the breathing machine which will help keep your temperature down.  It is common for your temperature to cycle up and down following surgery, especially at night when you are not up moving around and exerting yourself.  The breathing machine keeps your lungs expanded and your temperature down.  RANGE OF MOTION AND STRENGTHENING EXERCISES  These exercises are designed to help you keep full movement of your hip joint. Follow your caregiver's or physical therapist's instructions. Perform all exercises about fifteen times, three times per day or as directed. Exercise both hips, even if you have had only one joint replacement. These exercises can be done on a training (exercise) mat, on the floor, on a table or on a bed. Use whatever works the best and is most comfortable for you. Use music or television while you are exercising so that the exercises are a pleasant break in your day. This   will make your life better with the exercises acting as a break in routine you can look forward to.  Lying on your back, slowly slide your foot toward your buttocks, raising your knee up off the floor. Then slowly slide your foot back down until your leg is straight again.  Lying on your back spread your legs as far apart as you can without causing discomfort.  Lying on your side, raise your upper leg and foot straight up from the floor as far as is comfortable. Slowly lower the leg and repeat.  Lying on your back, tighten up the  muscle in the front of your thigh (quadriceps muscles). You can do this by keeping your leg straight and trying to raise your heel off the floor. This helps strengthen the largest muscle supporting your knee.  Lying on your back, tighten up the muscles of your buttocks both with the legs straight and with the knee bent at a comfortable angle while keeping your heel on the floor.   SKILLED REHAB INSTRUCTIONS: If the patient is transferred to a skilled rehab facility following release from the hospital, a list of the current medications will be sent to the facility for the patient to continue.  When discharged from the skilled rehab facility, please have the facility set up the patient's Home Health Physical Therapy prior to being released. Also, the skilled facility will be responsible for providing the patient with their medications at time of release from the facility to include their pain medication and their blood thinner medication. If the patient is still at the rehab facility at time of the two week follow up appointment, the skilled rehab facility will also need to assist the patient in arranging follow up appointment in our office and any transportation needs.  MAKE SURE YOU:  Understand these instructions.  Will watch your condition.  Will get help right away if you are not doing well or get worse.  Pick up stool softner and laxative for home use following surgery while on pain medications. Daily dry dressing changes as needed. In 4 days, you may remove your dressings and begin taking showers - no tub baths or soaking the incisions. Continue to use ice for pain and swelling after surgery. Do not use any lotions or creams on the incision until instructed by your surgeon.   

## 2019-04-14 NOTE — Progress Notes (Signed)
OT Cancellation Note  Patient Details Name: Curtis [name] MRN: [id number] DOB: [birth year]   Cancelled Treatment:    Reason Eval/Treat Not Completed: Patient not medically ready(Awaiting orthopedic consult.)  [name] 04/14/2019, 8:15 AM  Nestor Lewandowsky, OTR/L Acute Rehabilitation Services Pager: [phone] Office: [phone]

## 2019-04-14 NOTE — Consult Note (Signed)
Reason for Consult:Left hip fx Referring Physician: [name] is an 73 y.o. male.  HPI: Curtis [name] was at home and was trying to stand with his RW from his WC. He somehow lost his balance and fell to the floor. He had immediate pain and could not get up. He was brought to the ED at [facility] where x-rays showed an intertroch fx. He was transferred to [facility] for definitive treatment and orthopedic surgery was consulted. He lives at home alone though his son is next door and he ambulates with a RW at baseline.  Past Medical History:  Diagnosis Date  . AAA (abdominal aortic aneurysm), stable 04/20/2014  . Arthritis   . 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 CAD (coronary artery disease)    a. s/p cath in 2015 showing occluded RCA and high-grade LCx stenosis treated with DESx2 to LCx  . Cancer ([place])   . Depression   . Diverticulitis    hospital 2011 [facility]  . Diverticulosis   . GERD (gastroesophageal reflux disease)   . 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 Hernia of unspecified site of abdominal cavity without mention of obstruction or gangrene    hiatal  . Hyperlipidemia LDL goal <70 04/20/2014  . Hypertension   . IBS (irritable bowel syndrome)   . Myocardial infarction ([place]) 01/2014   NSTEMI  . Peripheral vascular disease (HCC)    ankle brachial index of 0.79 on the right and 0.61 on the left.   . Pneumonia   . 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 Reflex sympathetic dystrophy   . 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 SOB (shortness of breath)     Past Surgical History:  Procedure Laterality Date  . CARDIAC CATHETERIZATION    . CATARACT EXTRACTION    . CORONARY ANGIOPLASTY    . HERNIA REPAIR    . LEFT HEART CATHETERIZATION WITH CORONARY ANGIOGRAM N/A 01/17/2014   Procedure: LEFT HEART CATHETERIZATION WITH CORONARY ANGIOGRAM;  Surgeon: Leonie Man, MD;  Location: [facility] CATH LAB;  Service: Cardiovascular;  Laterality: N/A;  . left shoulder      x 3  . NISSEN FUNDOPLICATION    . right  elbow surgery     x 2  . right knee arthroscopy    . squamous cell skin cancer     Left Hand  . SUPRAVENTRICULAR TACHYCARDIA ABLATION N/A 01/16/2014   Procedure: SUPRAVENTRICULAR TACHYCARDIA ABLATION;  Surgeon: Evans Lance, MD;  Location: [facility] CATH LAB;  Service: Cardiovascular;  Laterality: N/A;    Family History  Problem Relation Age of Onset  . Colon cancer Mother   . Colon cancer Maternal Aunt   . Colon cancer Maternal Uncle   . Heart attack Neg Hx   . Stroke Neg Hx     Social History:  reports that he quit smoking about 13 years ago. His smoking use included cigarettes. He has a 125.00 pack-year smoking history. He quit smokeless tobacco use about 6 years ago. He reports that he does not drink alcohol or use drugs.  Allergies:  Allergies  Allergen Reactions  . Codeine Itching  . Trileptal [Oxcarbazepine]     Other reaction(s): Other (See Comments) Abnormal sodium levels Hyponatremia    Medications: I have reviewed the patient's current medications.  Results for orders placed or performed during the hospital encounter of 04/12/19 (from the past 48 hour(s))  CBC with Differential     Status: Abnormal   Collection Time: 04/13/19 12:27 AM  Result Value Ref Range   WBC 14.9 (H) 4.0 - 10.5 K/uL   RBC 3.48 (L) 4.22 - 5.81 MIL/uL   Hemoglobin 9.7 (L) 13.0 - 17.0 g/dL   HCT 32.9 (L) 39.0 - 52.0 %   MCV 94.5 80.0 - 100.0 fL   MCH 27.9 26.0 - 34.0 pg   MCHC 29.5 (L) 30.0 - 36.0 g/dL   RDW 16.4 (H) 11.5 - 15.5 %   Platelets 341 150 - 400 K/uL   nRBC 0.9 (H) 0.0 - 0.2 %   Neutrophils Relative % 80 %   Neutro Abs 12.0 (H) 1.7 - 7.7 K/uL   Lymphocytes Relative 4 %   Lymphs Abs 0.5 (L) 0.7 - 4.0 K/uL   Monocytes Relative 8 %   Monocytes Absolute 1.2 (H) 0.1 - 1.0 K/uL   Eosinophils Relative 2 %   Eosinophils Absolute 0.3 0.0 - 0.5 K/uL   Basophils Relative 1 %   Basophils Absolute 0.1 0.0 - 0.1 K/uL   Immature Granulocytes 5 %   Abs Immature Granulocytes 0.70 (H) 0.00 -  0.07 K/uL    Comment: Performed at [facility], [address], [place], [place] [id number]  Basic metabolic panel     Status: Abnormal   Collection Time: 04/13/19 12:27 AM  Result Value Ref Range   Sodium 136 135 - 145 mmol/L   Potassium 4.9 3.5 - 5.1 mmol/L   Chloride 97 (L) 98 - 111 mmol/L   CO2 28 22 - 32 mmol/L   Glucose, Bld 142 (H) 70 - 99 mg/dL   BUN 39 (H) 8 - 23 mg/dL   Creatinine, Ser 1.50 (H) 0.61 - 1.24 mg/dL   Calcium 9.6 8.9 - 10.3 mg/dL   GFR calc non Af Amer 45 (L) >60 mL/min   GFR calc Af Amer 52 (L) >60 mL/min   Anion gap 11 5 - 15    Comment: Performed at [facility], [address] [place], [place] [address]  Protime-INR     Status: Abnormal   Collection Time: 04/13/19 12:27 AM  Result Value Ref Range   Prothrombin Time 16.0 (H) 11.4 - 15.2 seconds   INR 1.3 (H) 0.8 - 1.2    Comment: (NOTE) INR goal varies based on device and disease states. Performed at [facility], [address], [place], [place] [address]   Lactic acid, plasma     Status: None   Collection Time: 04/13/19 12:27 AM  Result Value Ref Range   Lactic Acid, Venous 1.7 0.5 - 1.9 mmol/L    Comment: Performed at [facility], [address], [place], [place] [address]  Brain natriuretic peptide     Status: Abnormal   Collection Time: 04/13/19 12:27 AM  Result Value Ref Range   B Natriuretic Peptide 755.0 (H) 0.0 - 100.0 pg/mL    Comment: Performed at [facility], [address], [place], [place] [address]  Troponin I (High Sensitivity)     Status: Abnormal   Collection Time: 04/13/19 12:27 AM  Result Value Ref Range   Troponin I (High Sensitivity) 65 (H) <18 ng/L    Comment: (NOTE) Elevated high sensitivity troponin I (hsTnI) values and significant  changes across serial measurements may suggest ACS but many other  chronic and acute conditions are known to elevate hsTnI results.  Refer to the "Links" section for chest pain algorithms and additional  guidance. Performed at [facility], [address], [place], [place] [address]   SARS CORONAVIRUS 2 (TAT 6-24  HRS) Nasopharyngeal Nasopharyngeal Swab     Status: None   Collection Time: 04/13/19  2:13 AM   Specimen: Nasopharyngeal Swab  Result Value Ref Range   SARS Coronavirus 2 NEGATIVE NEGATIVE    Comment: (NOTE) SARS-CoV-2 target nucleic acids are NOT DETECTED. The SARS-CoV-2 RNA is generally detectable in upper and lower respiratory specimens during the acute phase of infection. Negative results do not preclude SARS-CoV-2 infection, do not rule out co-infections with other pathogens, and should not be used as the sole basis for treatment or other patient management decisions. Negative results must be combined with clinical observations, patient history, and epidemiological information. The expected result is Negative. Fact Sheet for Patients: SugarRoll.be Fact Sheet for Healthcare Providers: https://www.woods-mathews.com/ This test is not yet approved or cleared by the [place] FDA and  has been authorized for detection and/or diagnosis of SARS-CoV-2 by FDA under an Emergency Use Authorization (EUA). This EUA will remain  in effect (meaning this test can be used) for the duration of the COVID-19 declaration under Section 56 4(b)(1) of the Act, 21 U.S.C. section 360bbb-3(b)(1), unless the authorization is terminated or revoked sooner. Performed at [facility], [place] [address], [place], [place] [address]   Troponin I (High Sensitivity)     Status: Abnormal   Collection Time: 04/13/19  2:50 AM  Result Value Ref Range   Troponin I (High Sensitivity) 74 (H) <18 ng/L    Comment: (NOTE) Elevated high sensitivity troponin I (hsTnI) values and significant  changes across serial measurements may suggest ACS but many other  chronic and acute conditions are known to elevate hsTnI results.  Refer to the "Links" section for chest pain algorithms and additional   guidance. Performed at [facility], [address], [place], [place] [address]   Blood gas, arterial     Status: Abnormal   Collection Time: 04/13/19  3:12 AM  Result Value Ref Range   FIO2 32.00    Delivery systems NASAL CANNULA    pH, Arterial 7.307 (L) 7.350 - 7.450   pCO2 arterial 59.0 (H) 32.0 - 48.0 mmHg   pO2, Arterial 76.5 (L) 83.0 - 108.0 mmHg   Bicarbonate 26.2 20.0 - 28.0 mmol/L   Acid-Base Excess 2.8 (H) 0.0 - 2.0 mmol/L   O2 Saturation 93.0 %   Patient temperature 36.6    Collection site RIGHT RADIAL    Drawn by [id number]    Allens test (pass/fail) PASS PASS    Comment: Performed at [facility], [address], [place], [place] [id number]  Basic metabolic panel     Status: Abnormal   Collection Time: 04/13/19  6:50 AM  Result Value Ref Range   Sodium 137 135 - 145 mmol/L   Potassium 4.8 3.5 - 5.1 mmol/L   Chloride 98 98 - 111 mmol/L   CO2 29 22 - 32 mmol/L   Glucose, Bld 155 (H) 70 - 99 mg/dL   BUN 40 (H) 8 - 23 mg/dL   Creatinine, Ser 1.31 (H) 0.61 - 1.24 mg/dL   Calcium 9.6 8.9 - 10.3 mg/dL   GFR calc non Af Amer 53 (L) >60 mL/min   GFR calc Af Amer >60 >60 mL/min   Anion gap 10 5 - 15    Comment: Performed at [facility], [address], [place], [place] [address]  CBC     Status: Abnormal   Collection Time: 04/13/19  6:50 AM  Result Value Ref Range   WBC 13.5 (H) 4.0 - 10.5  K/uL   RBC 3.32 (L) 4.22 - 5.81 MIL/uL   Hemoglobin 9.2 (L) 13.0 - 17.0 g/dL   HCT 31.7 (L) 39.0 - 52.0 %   MCV 95.5 80.0 - 100.0 fL   MCH 27.7 26.0 - 34.0 pg   MCHC 29.0 (L) 30.0 - 36.0 g/dL   RDW 16.3 (H) 11.5 - 15.5 %   Platelets 331 150 - 400 K/uL   nRBC 0.7 (H) 0.0 - 0.2 %    Comment: Performed at [facility] [address] [place], [place] [address]  Surgical pcr screen     Status: Abnormal   Collection Time: 04/14/19 12:06 AM   Specimen: Nasal Mucosa; Nasal Swab  Result Value Ref Range   MRSA, PCR POSITIVE (A) NEGATIVE   Staphylococcus aureus POSITIVE (A) NEGATIVE     Comment: RESULT CALLED TO, READ BACK BY AND VERIFIED WITHRex Kras RN 04/14/19 [id number] JDW (NOTE) The Xpert SA Assay (FDA approved for NASAL specimens in patients 63 years of age and older), is one component of a comprehensive surveillance program. It is not intended to diagnose infection nor to guide or monitor treatment. Performed at [facility], [place] [address] [place], [place] [address]     DG Chest Port 1 View  Result Date: 04/13/2019 CLINICAL DATA:  Shortness of breath. EXAM: PORTABLE CHEST 1 VIEW COMPARISON:  Radiograph 02/15/2019. Chest CTA 02/11/2019 FINDINGS: Cardiomegaly with unchanged mediastinal contours. Atherosclerosis of the aortic arch. Bilateral calcified pleural plaques and pleural thickening/small effusions, chronic and not significantly changed. No evidence of acute airspace disease, evaluation slightly limited due to underlying pleural disease. No pneumothorax. No acute osseous abnormalities. IMPRESSION: 1. Stable cardiomegaly. 2. Stable calcified pleural plaques and pleural chronic effusions. 3. No evidence of acute findings, evaluation slightly limited due to underlying pleural disease. Aortic Atherosclerosis (ICD10-I70.0). Electronically Signed   By: Keith Rake M.D.   On: 04/13/2019 00:40   DG Knee Left Port  Result Date: 04/13/2019 CLINICAL DATA:  Fall, known left hip fracture EXAM: PORTABLE LEFT KNEE - 1-2 VIEW COMPARISON:  None. FINDINGS: No fracture or dislocation is seen. Mild tricompartmental degenerative changes. Visualized soft tissues are within normal limits. Vascular calcifications. No suprapatellar knee joint effusion. IMPRESSION: Negative. Electronically Signed   By: Julian Hy M.D.   On: 04/13/2019 08:22   DG HIP UNILAT WITH PELVIS 2-3 VIEWS LEFT  Result Date: 04/13/2019 CLINICAL DATA:  Pain EXAM: DG HIP (WITH OR WITHOUT PELVIS) 2-3V LEFT COMPARISON:  None. FINDINGS: There is an acute mildly displaced intratrochanteric fracture of the  proximal left femur. There is no dislocation. There is osteopenia. Vascular calcifications are noted. Phleboliths project over the patient's pelvis. IMPRESSION: Acute mildly displaced intratrochanteric fracture of the proximal left femur. Electronically Signed   By: Constance Holster M.D.   On: 04/13/2019 00:39    Review of Systems  HENT: Negative for ear discharge, ear pain, hearing loss and tinnitus.   Eyes: Negative for photophobia and pain.  Respiratory: Negative for cough and shortness of breath.   Cardiovascular: Negative for chest pain.  Gastrointestinal: Negative for abdominal pain, nausea and vomiting.  Genitourinary: Negative for dysuria, flank pain, frequency and urgency.  Musculoskeletal: Positive for arthralgias (Left hip). Negative for back pain, myalgias and neck pain.  Neurological: Negative for dizziness and headaches.  Hematological: Does not bruise/bleed easily.  Psychiatric/Behavioral: The patient is not nervous/anxious.    Blood pressure 125/74, pulse 100, temperature 98 F (36.7 C), temperature source Oral, resp. rate 16, height  6' (1.829 m), weight 81 kg, SpO2 94 %. Physical Exam  Constitutional: He appears well-developed and well-nourished. No distress.  HENT:  Head: Normocephalic and atraumatic.  Eyes: Conjunctivae are normal. Right eye exhibits no discharge. Left eye exhibits no discharge. No scleral icterus.  Cardiovascular: Normal rate and regular rhythm.  Respiratory: Effort normal. No respiratory distress.  Musculoskeletal:     Cervical back: Normal range of motion.     Comments: LLE No traumatic wounds, ecchymosis, or rash  Hip mild TTP  No knee or ankle effusion  Knee stable to varus/ valgus and anterior/posterior stress  Sens DPN, SPN, TN absent (says this is chronic)  Motor EHL, ext, flex, evers 1/5  DP 1+, PT 0, No significant edema  Neurological: He is alert.  Skin: Skin is warm and dry. He is not diaphoretic.  Psychiatric: He has a normal mood  and affect. His behavior is normal.    Assessment/Plan: Left hip fx -- Plan IMN this afternoon by Dr. Lyla Glassing. Please keep NPO. Multiple medical problems including AKI, chronic resp failure, hx/o aflutter, CAD, RSD, BPH, and anemia -- per primary service    Lisette Abu, PA-C Orthopedic Surgery [phone] 04/14/2019, 10:24 AM

## 2019-04-14 NOTE — Anesthesia Procedure Notes (Signed)
Procedure Name: Intubation Date/Time: 04/14/2019 1:54 PM Performed by: Candis Shine, CRNA Pre-anesthesia Checklist: Patient identified, Emergency Drugs available, Suction available and Patient being monitored Patient Re-evaluated:Patient Re-evaluated prior to induction Oxygen Delivery Method: Circle System Utilized Preoxygenation: Pre-oxygenation with 100% oxygen Induction Type: IV induction Ventilation: Mask ventilation without difficulty Laryngoscope Size: Mac and 3 Grade View: Grade I Tube type: Oral Tube size: 7.5 mm Number of attempts: 1 Airway Equipment and Method: Stylet Placement Confirmation: ETT inserted through vocal cords under direct vision,  positive ETCO2 and breath sounds checked- equal and bilateral Secured at: 22 cm Tube secured with: Tape Dental Injury: Teeth and Oropharynx as per pre-operative assessment

## 2019-04-14 NOTE — Progress Notes (Signed)
OT Cancellation Note  Patient Details Name: JAZER RIEDE MRN: [id number] DOB: 1945-12-19   Cancelled Treatment:    Reason Eval/Treat Not Completed: Patient not medically ready(Pt to have IM nail this pm)  [name] 04/14/2019, 11:28 AM  Nestor Lewandowsky, OTR/L Acute Rehabilitation Services Pager: [phone] Office: [phone]

## 2019-04-14 NOTE — Progress Notes (Signed)
The patient's son was called and given an update.  [employer] [phone]

## 2019-04-14 NOTE — Progress Notes (Signed)
Report given to [facility], RN in [employer]  The patient is ready for surgery

## 2019-04-14 NOTE — Progress Notes (Signed)
PT Cancellation Note  Patient Details Name: Curtis [name] MRN: [id number] DOB: 13-Jun-1945   Cancelled Treatment:    Reason Eval/Treat Not Completed: Patient not medically ready. Per ortho, pt going to OR for IM nail this afternoon. PT will continue to f/u with pt acutely for evaluation following surgery.    [place] 04/14/2019, 11:02 AM

## 2019-04-14 NOTE — Op Note (Signed)
OPERATIVE REPORT  SURGEON: Rod Can, MD   ASSISTANT: Izola Price, RNFA.  PREOPERATIVE DIAGNOSIS: Left intertrochanteric femur fracture.   POSTOPERATIVE DIAGNOSIS: Left intertrochanteric femur fracture.   PROCEDURE: Intramedullary fixation, Left femur.   IMPLANTS: Stryker Gamma3 Hip Fracture Nail, 11 by 180 mm, 125 degrees. 10.5 x 110 mm Hip Fracture Nail Lag Screw. 5 x 45 mm distal interlocking screw 1.  ANESTHESIA:  General  ESTIMATED BLOOD LOSS: 50 mL.  ANTIBIOTICS: 1 g Vancomycin.  DRAINS: None.  COMPLICATIONS: None.   CONDITION: PACU - hemodynamically stable.[name]   BRIEF CLINICAL NOTE: Curtis [name] is a 73 y.o. male who presented with an intertrochanteric femur fracture. He has a history of previous left lower extremity injury with RSD and significant neurological deficit. The patient was admitted to the hospitalist service and underwent perioperative risk stratification and medical optimization. The risks, benefits, and alternatives to the procedure were explained, and the patient elected to proceed.  PROCEDURE IN DETAIL: Surgical site was marked by myself. The patient was taken to the operating room and anesthesia was induced on the bed. The patient was then transferred to the [facility] table and the nonoperative lower extremity was scissored underneath the operative side. The fracture was reduced with traction, internal rotation, and adduction. The hip was prepped and draped in the normal sterile surgical fashion. Timeout was called verifying side and site of surgery. Preop antibiotics were given with 60 minutes of beginning the procedure.  Fluoroscopy was used to define the patient's anatomy. A 4 cm incision was made just proximal to the tip of the greater trochanter. The awl was used to obtain the standard starting point for a trochanteric entry nail under fluoroscopic control. The guidepin was placed. The entry reamer was used to open the proximal femur.  On the back  table, the nail was assembled onto the jig. The nail was placed into the femur without any difficulty. Through a separate stab incision, the cannula was placed down to the bone in preparation for the cephalomedullary device. A guidepin was placed into the femoral head using AP and lateral fluoroscopy views. The pin was measured, and then reaming was performed to the appropriate depth. The lag screw was inserted to the appropriate depth. The fracture was compressed through the jig. The setscrew was tightened and then loosened one quarter turn. A separate stab incision was created, and the distal interlocking screw was placed using standard AO technique. The jig was removed. Final AP and lateral fluoroscopy views were obtained to confirm fracture reduction and hardware placement. Tip apex distance was appropriate. There was no chondral penetration.  The wounds were copiously irrigated with saline. The wound was closed in layers with #1 Vicryl for the fascia, 2-0 Monocryl for the deep dermal layer, and 3-0 Monocryl subcuticular stitch. Glue was applied to the skin. Once the glue was fully hardened, sterile dressing was applied. The patient was then awakened from anesthesia and taken to the PACU in stable condition. Sponge needle and instrument counts were correct at the end of the case 2. There were no known complications.  We will readmit the patient to the hospitalist. Weightbearing status will be weightbearing as tolerated with a walker. We will begin Lovenox for DVT prophylaxis. The patient will work with physical therapy and undergo disposition planning.

## 2019-04-15 ENCOUNTER — Encounter: Payer: Self-pay | Admitting: *Deleted

## 2019-04-15 LAB — BASIC METABOLIC PANEL
Anion gap: 10 (ref 5–15)
BUN: 33 mg/dL — ABNORMAL HIGH (ref 8–23)
CO2: 28 mmol/L (ref 22–32)
Calcium: 9.2 mg/dL (ref 8.9–10.3)
Chloride: 99 mmol/L (ref 98–111)
Creatinine, Ser: 1.05 mg/dL (ref 0.61–1.24)
GFR calc Af Amer: >60 mL/min (ref >60)
GFR calc non Af Amer: >60 mL/min (ref >60)
Glucose, Bld: 131 mg/dL — ABNORMAL HIGH (ref 70–99)
Potassium: 4.2 mmol/L (ref 3.5–5.1)
Sodium: 137 mmol/L (ref 135–145)

## 2019-04-15 LAB — CBC
HCT: 29.4 % — ABNORMAL LOW (ref 39.0–52.0)
Hemoglobin: 8.8 g/dL — ABNORMAL LOW (ref 13.0–17.0)
MCH: 27.6 pg (ref 26.0–34.0)
MCHC: 29.9 g/dL — ABNORMAL LOW (ref 30.0–36.0)
MCV: 92.2 fL (ref 80.0–100.0)
Platelets: 347 10*3/uL (ref 150–400)
RBC: 3.19 MIL/uL — ABNORMAL LOW (ref 4.22–5.81)
RDW: 16.6 % — ABNORMAL HIGH (ref 11.5–15.5)
WBC: 10.6 10*3/uL — ABNORMAL HIGH (ref 4.0–10.5)
nRBC: 0.4 % — ABNORMAL HIGH (ref 0.0–0.2)

## 2019-04-15 MED ORDER — ENOXAPARIN SODIUM 40 MG/0.4ML ~~LOC~~ SOLN: 40.0000 mg | SUBCUTANEOUS | 0 refills | Status: DC

## 2019-04-15 MED ORDER — ALPRAZOLAM 0.5 MG PO TABS
1.0000 mg | ORAL_TABLET | Freq: Two times a day (BID) | ORAL | Status: DC | PRN
  Administered 2019-04-15 – 2019-04-19 (×4): 1 mg via ORAL
  Filled 2019-04-15 (×4): qty 2

## 2019-04-15 NOTE — Plan of Care (Signed)

## 2019-04-15 NOTE — Progress Notes (Signed)
    Subjective:  Patient reports pain as mild to moderate.  Denies N/V/CP/SOB.   Objective:   VITALS:   Vitals:   04/14/19 2212 04/15/19 0001 04/15/19 0500 04/15/19 0855  BP: (!) 142/73 129/63 129/63 138/76  Pulse: 93 62 62 68  Resp:  14 14 16   Temp:  97.8 F (36.6 C) 97.8 F (36.6 C)   TempSrc:  Oral Oral   SpO2:  98% 98% 100%  Weight:      Height:        NAD Intact pulses distally Incision: dressing C/D/I Compartment soft Altered sensation - unchanged from preop Trace toe movement only - unchanged from preop  Lab Results  Component Value Date   WBC 10.6 (H) 04/15/2019   HGB 8.8 (L) 04/15/2019   HCT 29.4 (L) 04/15/2019   MCV 92.2 04/15/2019   PLT 347 04/15/2019   BMET    Component Value Date/Time   NA 137 04/15/2019 0520   K 4.2 04/15/2019 0520   CL 99 04/15/2019 0520   CO2 28 04/15/2019 0520   GLUCOSE 131 (H) 04/15/2019 0520   BUN 33 (H) 04/15/2019 0520   CREATININE 1.05 04/15/2019 0520   CALCIUM 9.2 04/15/2019 0520   GFRNONAA >60 04/15/2019 0520   GFRAA >60 04/15/2019 0520     Assessment/Plan: 1 Day Post-Op   Active Problems:   Reflex sympathetic dystrophy   COPD with chronic bronchitis (HCC)   GERD   Asbestosis (HCC)   HTN (hypertension)   TIA (transient ischemic attack)   SVT (supraventricular tachycardia) s/p ablation 01/16/2014   CAD (coronary artery disease), native coronary artery   Peripheral vascular disease (HCC)   Hyperlipidemia LDL goal <70   Chronic respiratory failure with hypoxia (HCC)   Atrial flutter, chronic (HCC)   Closed left hip fracture (HCC)   Hip fracture ([place])   WBAT with walker DVT ppx: Lovenox for 30 days, SCDs, TEDS PO pain control PT/OT Dispo: ? SNF placement, patient WB for transfers@ baseline due to LLE function    Hilton Cork Geroldine Esquivias 04/15/2019, 2:45 PM   Rod Can, MD [phone] [place] [facility]  [place] [address], [address], [place], [place] [address]  Phone: [phone] [email or web address]  [employer]

## 2019-04-15 NOTE — TOC Initial Note (Signed)
Transition of Care [facility]) - Initial/Assessment Note    Patient Details  Name: Curtis [name] MRN: [id number] Date of Birth: 1946-04-27  Transition of Care [facility]) CM/SW Contact:    Midge Minium MSN, RN, NCM-BC, ACM-RN [phone] Phone Number: 04/15/2019, 4:24 PM  Clinical Narrative:                 CM following for dispositional needs. CM spoke to the patients son, Chazz Fok, to discuss the POC d/t the patients cognitive impairments. Patient lived at home alone PTA with his son living nearby; transfers with a W/C with his son assisting with bathing. PT/OT eval completed with SNF recommended; CM discussed the recommendations with the son agreeable to the referral being sent to [facility]. Son voiced feelings that the patient isn't medically ready to discharge; still have medical needs to address. CM team will continue to follow.   Expected Discharge Plan: Skilled Nursing Facility Barriers to Discharge: Continued Medical Work up   Expected Discharge Plan and Services Expected Discharge Plan: [place] In-house Referral: Clinical Social Work Discharge Planning Services: CM Consult Post Acute Care Choice: [place] Living arrangements for the past 2 months: [place], [place]                 DME Arranged: N/A DME Agency: NA       HH Arranged: NA [place] Agency: NA        Prior Living Arrangements/Services Living arrangements for the past 2 months: [place], [place] Lives with:: Self Patient language and need for interpreter reviewed:: Yes        Need for Family Participation in Patient Care: Yes (Comment) Care giver support system in place?: Yes (comment) Current home services: DME Criminal Activity/Legal Involvement Pertinent to Current Situation/Hospitalization: No - Comment as needed   Permission Sought/Granted Permission sought to share information with : Case Manager, Ship broker granted to share information with : Yes, Verbal Permission Granted     Permission granted to share info w AGENCY: SNF for DCP        Emotional Assessment Orientation: : Oriented to Self, Oriented to Place Alcohol / Substance Use: Not Applicable Psych Involvement: No (comment)  Admission diagnosis:  Closed displaced intertrochanteric fracture of left femur, initial encounter ([place]) [S72.142A] Hip pain, acute, left [M25.552] Closed comminuted intertrochanteric fracture of left femur ([place]) [S72.142A] Closed comminuted intertrochanteric fracture of left femur, initial encounter ([place]) [S72.142A] Hip fracture ([place]) [S72.009A] Patient Active Problem List   Diagnosis Date Noted  . Closed left hip fracture ([place]) 04/13/2019  . Hip fracture ([place]) 04/13/2019  . Closed comminuted intertrochanteric fracture of proximal end of left femur ([place])   . Protein-calorie malnutrition, severe ([place]) 03/12/2019  . Hospital discharge follow-up 03/06/2019  . Chronic diastolic heart failure ([place]) 03/02/2019  . Unstageable pressure ulcer of left buttock ([place]) 02/21/2019  . Decubitus ulcer of heel, bilateral, unstageable ([place]) 02/21/2019  . Acute encephalopathy 02/20/2019  . Anasarca 02/20/2019  . BPH with urinary obstruction 02/20/2019  . Sacral decubitus ulcer, stage IV ([place])   . D-dimer, elevated 02/08/2019  . Elevated liver enzymes 02/08/2019  . Nausea vomiting and diarrhea 02/08/2019  . Acute hepatitis 02/08/2019  . Physical deconditioning 02/04/2019  . Pressure injury of deep tissue of left heel 01/27/2019  . Atrial flutter, chronic ([place]) 01/15/2019  . Hypokalemia 01/15/2019  . Bilateral open angle glaucoma 01/15/2019  . Anxiety disorder 01/15/2019  . Acute diastolic  CHF (congestive heart failure) ([place]) 01/03/2019  . Pressure ulcer of right heel, stage 2 ([place]) 01/03/2019  . COPD exacerbation ([place]) 01/02/2019  . Acute on chronic respiratory failure with hypoxia and  hypercapnia ([place]) 01/02/2019  . Hyperkalemia 01/02/2019  . Sepsis ([place]) 12/15/2018  . Situational mixed anxiety and depressive disorder 02/28/2018  . Carotid artery disease ([place]) 02/27/2016  . Acute hyponatremia 02/12/2015  . Hereditary and idiopathic peripheral neuropathy 01/30/2015  . Acute on chronic respiratory failure ([place]) 05/08/2014  . Anemia, iron deficiency 05/08/2014  . Hyponatremia 05/08/2014  . Urinary retention 05/08/2014  . Chronic respiratory failure with hypoxia ([place]) 04/30/2014  . Shortness of breath   . HCAP (healthcare-associated pneumonia) 04/24/2014  . Hyperlipidemia LDL goal <70 04/20/2014  . AAA (abdominal aortic aneurysm), stable 04/20/2014  . Abdominal pain, generalized   . SOB (shortness of breath)   . Peripheral vascular disease ([place])   . Presence of drug coated stent in left circumflex coronary artery: Promus DES 3.5 mm x 38 mm (3.9 mm) 01/17/2014    Class: Acute  . CAD (coronary artery disease), native coronary artery   . SVT (supraventricular tachycardia) s/p ablation 01/16/2014 01/16/2014  . 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  . TIA (transient ischemic attack) 09/06/2013  . HTN (hypertension) 09/05/2013  . AKI (acute kidney injury) ([place]) 11/01/2012  . TOBACCO ABUSE, hx 08/23/2007  . COPD with chronic bronchitis ([place]) 08/12/2007  . Asbestosis ([place]) 08/12/2007  . Reflex sympathetic dystrophy 05/29/2007  . GERD 05/29/2007   PCP:  [name], MD Pharmacy:   [facility] [place] [address] [place] [address] Phone: [phone] Fax: [phone]  [id number] [place] - [address] [place]  [address] Phone: [phone] Fax: [phone]  [facility] - [place], [place] - [address] [place] [address] Phone: [phone] Fax:  [phone]     Social Determinants of Health (SDOH) Interventions    Readmission Risk Interventions Readmission Risk Prevention Plan 02/11/2019 01/04/2019 12/19/2018  Transportation Screening Complete Complete Complete  PCP or Specialist Appt within 5-7 Days - - Complete  PCP or Specialist Appt within 3-5 Days - Complete -  Home Care Screening - - Complete  Medication Review (RN CM) - - Complete  HRI or [place] - Complete -  Social Work Consult for [place] Planning/Counseling - Complete -  Palliative Care Screening - Not Applicable -  Medication Review Press photographer) Complete Complete -  PCP or Specialist appointment within 3-5 days of discharge Not Complete - -  PCP/Specialist Appt Not Complete comments Pt returning to SNF - -  [place] or [place] Not Complete - -  [place] or [place] Pt Refusal Comments Pt returning to SNF - -  SW Recovery Care/Counseling Consult Complete - -  Palliative Care Screening Not Applicable - -  [place] Complete - -  Some recent data might be hidden

## 2019-04-15 NOTE — NC FL2 (Signed)
[place] MEDICAID FL2 LEVEL OF CARE SCREENING TOOL     IDENTIFICATION  Patient Name: Curtis [name] Birthdate: [birth year] Sex: male Admission Date (Current Location): 04/12/2019  [facility] and [place] Number:  Herbalist and Address:  The . [facility], [place] [address] [place], [place] [address]      Provider Number: [id number]  Attending Physician Name and Address:  Cristal Ford, DO  Relative Name and Phone Number:  Kernie Gerring [phone]    Current Level of Care: Hospital Recommended Level of Care: [place] Prior Approval Number:    Date Approved/Denied:   PASRR Number: [id number]  Discharge Plan: SNF    Current Diagnoses: Patient Active Problem List   Diagnosis Date Noted  . Closed left hip fracture ([place]) 04/13/2019  . Hip fracture ([place]) 04/13/2019  . Closed comminuted intertrochanteric fracture of proximal end of left femur ([place])   . Protein-calorie malnutrition, severe ([place]) 03/12/2019  . Hospital discharge follow-up 03/06/2019  . Chronic diastolic heart failure ([place]) 03/02/2019  . Unstageable pressure ulcer of left buttock ([place]) 02/21/2019  . Decubitus ulcer of heel, bilateral, unstageable ([place]) 02/21/2019  . Acute encephalopathy 02/20/2019  . Anasarca 02/20/2019  . BPH with urinary obstruction 02/20/2019  . Sacral decubitus ulcer, stage IV ([place])   . D-dimer, elevated 02/08/2019  . Elevated liver enzymes 02/08/2019  . Nausea vomiting and diarrhea 02/08/2019  . Acute hepatitis 02/08/2019  . Physical deconditioning 02/04/2019  . Pressure injury of deep tissue of left heel 01/27/2019  . Atrial flutter, chronic ([place]) 01/15/2019  . Hypokalemia 01/15/2019  . Bilateral open angle glaucoma 01/15/2019  . Anxiety disorder 01/15/2019  . Acute diastolic CHF (congestive heart failure) ([place]) 01/03/2019  . Pressure ulcer of right heel, stage 2 ([place]) 01/03/2019  . COPD exacerbation ([place]) 01/02/2019  . Acute on chronic  respiratory failure with hypoxia and hypercapnia ([place]) 01/02/2019  . Hyperkalemia 01/02/2019  . Sepsis ([place]) 12/15/2018  . Situational mixed anxiety and depressive disorder 02/28/2018  . Carotid artery disease ([place]) 02/27/2016  . Acute hyponatremia 02/12/2015  . Hereditary and idiopathic peripheral neuropathy 01/30/2015  . Acute on chronic respiratory failure ([place]) 05/08/2014  . Anemia, iron deficiency 05/08/2014  . Hyponatremia 05/08/2014  . Urinary retention 05/08/2014  . Chronic respiratory failure with hypoxia ([place]) 04/30/2014  . Shortness of breath   . HCAP (healthcare-associated pneumonia) 04/24/2014  . Hyperlipidemia LDL goal <70 04/20/2014  . AAA (abdominal aortic aneurysm), stable 04/20/2014  . Abdominal pain, generalized   . SOB (shortness of breath)   . Peripheral vascular disease ([place])   . Presence of drug coated stent in left circumflex coronary artery: Promus DES 3.5 mm x 38 mm (3.9 mm) 01/17/2014  . CAD (coronary artery disease), native coronary artery   . SVT (supraventricular tachycardia) s/p ablation 01/16/2014 01/16/2014  . 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  . TIA (transient ischemic attack) 09/06/2013  . HTN (hypertension) 09/05/2013  . AKI (acute kidney injury) ([place]) 11/01/2012  . TOBACCO ABUSE, hx 08/23/2007  . COPD with chronic bronchitis ([place]) 08/12/2007  . Asbestosis ([place]) 08/12/2007  . Reflex sympathetic dystrophy 05/29/2007  . GERD 05/29/2007    Orientation RESPIRATION BLADDER Height & Weight     Self, Place  O2(4LPM continuously) Continent, Indwelling catheter Weight: 81 kg Height:  6' (182.9 cm)  BEHAVIORAL SYMPTOMS/MOOD NEUROLOGICAL BOWEL NUTRITION STATUS  Other (Comment)(N/A) (N/A) Continent Diet(see discharge summary)  AMBULATORY STATUS COMMUNICATION OF NEEDS Skin   Extensive Assist Verbally Surgical wounds,  PU Stage and Appropriate Care, Other (Comment)(Sacrum unstageable; Left hip incision)   PU Stage 2 Dressing: Daily(Right  heel) PU Stage 3 Dressing: Daily(Left heel)                 Personal Care Assistance Level of Assistance  Bathing, Feeding, Dressing Bathing Assistance: Maximum assistance Feeding assistance: Limited assistance Dressing Assistance: Maximum assistance     Functional Limitations Info  Sight, Hearing, Speech Sight Info: Adequate Hearing Info: Adequate Speech Info: Adequate    SPECIAL CARE FACTORS FREQUENCY  PT (By licensed PT), OT (By licensed OT)     PT Frequency: Min 3X/week OT Frequency: Min 2X/week            Contractures Contractures Info: Not present    Additional Factors Info  Code Status, Allergies Code Status Info: Full Code Allergies Info: Trileptal, Codeine           Current Medications (04/15/2019):  This is the current hospital active medication list Current Facility-Administered Medications  Medication Dose Route Frequency Provider Last Rate Last Admin  . 0.9 %  sodium chloride infusion   Intravenous Continuous Rod Can, MD 30 mL/hr at 04/14/19 1850 New Bag at 04/14/19 1850  . acetaminophen (TYLENOL) tablet 650 mg  650 mg Oral Q6H PRN Hollace Hayward K, NP   650 mg at 04/14/19 2222  . bisacodyl (DULCOLAX) EC tablet 5 mg  5 mg Oral Daily PRN Swinteck, Aaron Edelman, MD      . bisacodyl (DULCOLAX) suppository 10 mg  10 mg Rectal Daily PRN Rod Can, MD      . Chlorhexidine Gluconate Cloth 2 % PADS 6 each  6 each Topical Daily Cristal Ford, DO   6 each at 04/15/19 1045  . collagenase (SANTYL) ointment 1 application  1 application Topical Daily Rod Can, MD   1 application at [id number]  . digoxin (LANOXIN) tablet 0.125 mg  0.125 mg Oral Daily Swinteck, Aaron Edelman, MD   0.125 mg at 04/15/19 1042  . docusate sodium (COLACE) capsule 100 mg  100 mg Oral Daily PRN Swinteck, Aaron Edelman, MD      . docusate sodium (COLACE) capsule 100 mg  100 mg Oral BID Rod Can, MD   100 mg at 04/15/19 1042  . enoxaparin (LOVENOX) injection 40 mg  40 mg  Subcutaneous Q24H Rod Can, MD   40 mg at 04/15/19 1045  . feeding supplement (PRO-STAT SUGAR FREE 64) liquid 30 mL  30 mL Oral BID BM Swinteck, Aaron Edelman, MD   30 mL at 04/15/19 1045  . finasteride (PROSCAR) tablet 5 mg  5 mg Oral Daily Swinteck, Aaron Edelman, MD   5 mg at 04/15/19 1042  . fluticasone furoate-vilanterol (BREO ELLIPTA) 200-25 MCG/INH 1 puff  1 puff Inhalation Daily Swinteck, Aaron Edelman, MD   1 puff at 04/13/19 1104  . latanoprost (XALATAN) 0.005 % ophthalmic solution 1 drop  1 drop Both Eyes QHS Rod Can, MD   1 drop at 04/14/19 2214  . menthol-cetylpyridinium (CEPACOL) lozenge 3 mg  1 lozenge Oral PRN Swinteck, Aaron Edelman, MD       Or  . phenol (CHLORASEPTIC) mouth spray 1 spray  1 spray Mouth/Throat PRN Swinteck, Aaron Edelman, MD      . metoCLOPramide (REGLAN) tablet 5-10 mg  5-10 mg Oral Q8H PRN Swinteck, Aaron Edelman, MD       Or  . metoCLOPramide (REGLAN) injection 5-10 mg  5-10 mg Intravenous Q8H PRN Swinteck, Aaron Edelman, MD      . metoprolol tartrate (LOPRESSOR) tablet 25  mg  25 mg Oral BID Rod Can, MD   25 mg at 04/15/19 1042  . multivitamin with minerals tablet 1 tablet  1 tablet Oral Daily Rod Can, MD   1 tablet at 04/15/19 1042  . mupirocin ointment (BACTROBAN) 2 % 1 application  1 application Nasal BID Rod Can, MD   1 application at [id number]  . nitroGLYCERIN (NITROSTAT) SL tablet 0.4 mg  0.4 mg Sublingual Q5 Min x 3 PRN Swinteck, Aaron Edelman, MD      . ondansetron (ZOFRAN) tablet 4 mg  4 mg Oral Q6H PRN Swinteck, Aaron Edelman, MD       Or  . ondansetron (ZOFRAN) injection 4 mg  4 mg Intravenous Q6H PRN Swinteck, Aaron Edelman, MD      . polyethylene glycol (MIRALAX / GLYCOLAX) packet 17 g  17 g Oral Daily PRN Swinteck, Aaron Edelman, MD      . spironolactone (ALDACTONE) tablet 25 mg  25 mg Oral Daily Rod Can, MD   25 mg at 04/15/19 1042  . tamsulosin (FLOMAX) capsule 0.4 mg  0.4 mg Oral QHS Rod Can, MD   0.4 mg at 04/14/19 2213  . torsemide (DEMADEX) tablet 20 mg  20 mg Oral  BID BM Rod Can, MD   20 mg at 04/15/19 1154     Discharge Medications: Please see discharge summary for a list of discharge medications.  Relevant Imaging Results:  Relevant Lab Results:   Additional Information SSN: [id number]  [place] MSN, [place], NCM-BC, [place] [phone]

## 2019-04-15 NOTE — Progress Notes (Signed)
PROGRESS NOTE    Curtis [name]  [id number] DOB: 12/13/1945 DOA: 04/12/2019 PCP: Curtis Sacramento, MD   Brief Narrative:  HPI on 04/13/2019 by Dr. [name] is a 73 y.o. male with medical history significant of AAA, osteoarthritis, asbestosis, CAD, history of NSTEMI, cancer, depression, diverticulosis,/diabetic colitis, GERD, glaucoma, abdominal hernia, hyperlipidemia, hypertension, IBS, peripheral vascular disease, history of pneumonia, reflex sympathetic dystrophy, stage III and IV decubiti ulcers who presented to the ER with left hip pain after a fall.  History is being obtained from the ER record as the patient has altered mental status and is unable to provide a history.  Patient sustained a fall 3 days ago at home.  He was apparently trying to sit down when he missed the chair and fell to the ground.  He normally uses a walker and a wheelchair at home.  As per the family, he had been having severe left hip pain since the fall and was unable to bear any weight on the leg.  The family brought him into the ER today.  Assessment & Plan   Left intertrochanteric femur fracture -Status post mechanical fall 3 days prior to admission.  Patient has been unable to bear weight on the left side. -Orthopedic surgery consulted and appreciated, s/p Intramedullary fixation, Left femur.  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 -Patient with history of asbestosis -On home oxygen 4 L -Appears stable -CXR reviewed, and unremarkable for infection  Acute metabolic encephalopathy -Appears to have resolved -Thought to be secondary to hypercapnia, respiratory acidosis -pH 7.3, PCO2 59, PO2 76 on admission -Patient was placed on BiPAP  Atrial  flutter, chronic -The patient has been taking Eliquis at home which is been held for possible surgery -Echocardiogram 01/02/2019 showed an EF of 55 to 60%, no significant valvular disease or systolic dysfunction -Continue metoprolol, digoxin  Coronary artery disease -Status post DES x2 to L circumflex in 2015 -Currently stable, no complaints of chest pain  History of reflux sympathetic dystrophy -Chronic physical deconditioning amatory dysfunction secondary to the above -At baseline, patient is able to ambulate with the use of a walker and uses a wheelchair -PT and OT consulted  BPH -Continue tamsulosin, finasteride  Chronic normocytic anemia -Hemoglobin currently 8.8, appears to be stable at baseline -Continue to monitor CBC  Depression/anxiety -Continue amitriptyline, Xanax as needed  Chronic decubitus ulcers -Present on admission, patient has chronic ulcerations on bilateral heels (stage II-III) as well as left buttock (unstageable) -Wound care consulted  DVT Prophylaxis  SCDs  Code Status: Full  Family Communication: None at bedside  Disposition Plan: Admitted.  Pending PT/OT evaluations. Disposition to be determined  Consultants Orthopedic surgery  Procedures  Intramedullary fixation, Left femur.   Antibiotics   Anti-infectives (From admission, onward)   Start     Dose/Rate Route Frequency Ordered Stop   04/15/19 0600  ceFAZolin (ANCEF) IVPB 2g/100 mL premix  Status:  Discontinued     2 g 200 mL/hr over 30 Minutes Intravenous To Short Stay 04/14/19 1251 04/14/19 1259   04/15/19 0000  vancomycin (VANCOCIN) IVPB 1000 mg/200 mL premix     1,000 mg 200 mL/hr over 60 Minutes Intravenous Every 12 hours 04/14/19 1700 04/15/19 0154   04/14/19 1315  vancomycin (VANCOCIN) IVPB 1000 mg/200 mL premix  1,000 mg 200 mL/hr over 60 Minutes Intravenous  Once 04/14/19 1304 04/14/19 1413   04/14/19 1300  ceFAZolin (ANCEF) IVPB 2g/100 mL premix     2 g 200 mL/hr over 30  Minutes Intravenous To Short Stay 04/14/19 1259 04/14/19 1403   04/14/19 1252  ceFAZolin (ANCEF) 2-4 GM/100ML-% IVPB    Note to Pharmacy: [facility]   : cabinet override      04/14/19 1252 04/14/19 1404      Subjective:   Curtis [name] seen and examined today.  Patient with no complaints today.  Denies current chest pain, shortness breath, abdominal pain, nausea vomiting, diarrhea constipation, dizziness or headache.  Afraid to get out of bed today.  Objective:   Vitals:   04/14/19 2212 04/15/19 0001 04/15/19 0500 04/15/19 0855  BP: (!) 142/73 129/63 129/63 138/76  Pulse: 93 62 62 68  Resp:  14 14 16   Temp:  97.8 F (36.6 C) 97.8 F (36.6 C)   TempSrc:  Oral Oral   SpO2:  98% 98% 100%  Weight:      Height:        Intake/Output Summary (Last 24 hours) at 04/15/2019 1213 Last data filed at 04/15/2019 0900 Gross per 24 hour  Intake 1415.62 ml  Output 625 ml  Net 790.62 ml   Filed Weights   04/12/19 2252 04/14/19 0616  Weight: 82.1 kg 81 kg   Exam  General: Well developed, well nourished, NAD  HEENT: NCAT, mucous membranes moist.   Cardiovascular: S1 S2 auscultated, irregular  Respiratory: Diminished breath sounds, however clear  Abdomen: Soft, nontender, nondistended, + bowel sounds  Extremities: warm dry without cyanosis clubbing or edema  Neuro: AAOx3, nonfocal  Psych: Appropriate mood and affect, pleasant   Data Reviewed: I have personally reviewed following labs and imaging studies  CBC: Recent Labs  Lab 04/13/19 0027 04/13/19 0650 04/14/19 1954 04/15/19 0520  WBC 14.9* 13.5* 11.0* 10.6*  NEUTROABS 12.0*  --   --   --   HGB 9.7* 9.2* 9.4* 8.8*  HCT 32.9* 31.7* 31.1* 29.4*  MCV 94.5 95.5 92.0 92.2  PLT 341 331 338 [id number]   Basic Metabolic Panel: Recent Labs  Lab 04/13/19 0027 04/13/19 0650 04/14/19 1954 04/15/19 0520  NA 136 137  --  137  K 4.9 4.8  --  4.2  CL 97* 98  --  99  CO2 28 29  --  28  GLUCOSE 142* 155*  --  131*  BUN  39* 40*  --  33*  CREATININE 1.50* 1.31* 1.04 1.05  CALCIUM 9.6 9.6  --  9.2   GFR: Estimated Creatinine Clearance: 67.7 mL/min (by C-G formula based on SCr of 1.05 mg/dL). Liver Function Tests: No results for input(s): AST, ALT, ALKPHOS, BILITOT, PROT, ALBUMIN in the last 168 hours. No results for input(s): LIPASE, AMYLASE in the last 168 hours. No results for input(s): AMMONIA in the last 168 hours. Coagulation Profile: Recent Labs  Lab 04/13/19 0027  INR 1.3*   Cardiac Enzymes: No results for input(s): CKTOTAL, CKMB, CKMBINDEX, TROPONINI in the last 168 hours. BNP (last 3 results) No results for input(s): PROBNP in the last 8760 hours. HbA1C: No results for input(s): HGBA1C in the last 72 hours. CBG: No results for input(s): GLUCAP in the last 168 hours. Lipid Profile: No results for input(s): CHOL, HDL, LDLCALC, TRIG, CHOLHDL, LDLDIRECT in the last 72 hours. Thyroid Function Tests: No results for input(s): TSH, T4TOTAL, FREET4, T3FREE, THYROIDAB in the last 72  hours. Anemia Panel: No results for input(s): VITAMINB12, FOLATE, FERRITIN, TIBC, IRON, RETICCTPCT in the last 72 hours. Urine analysis:    Component Value Date/Time   COLORURINE YELLOW 02/08/2019 2240   APPEARANCEUR CLOUDY (A) 02/08/2019 2240   LABSPEC 1.017 02/08/2019 2240   PHURINE 5.0 02/08/2019 2240   GLUCOSEU NEGATIVE 02/08/2019 2240   HGBUR MODERATE (A) 02/08/2019 2240   BILIRUBINUR NEGATIVE 02/08/2019 2240   KETONESUR NEGATIVE 02/08/2019 2240   PROTEINUR 100 (A) 02/08/2019 2240   UROBILINOGEN 0.2 02/12/2015 1859   NITRITE NEGATIVE 02/08/2019 2240   LEUKOCYTESUR LARGE (A) 02/08/2019 2240   Sepsis Labs: @LABRCNTIP (procalcitonin:4,lacticidven:4)  ) Recent Results (from the past 240 hour(s))  SARS CORONAVIRUS 2 (TAT 6-24 HRS) Nasopharyngeal Nasopharyngeal Swab     Status: None   Collection Time: 04/13/19  2:13 AM   Specimen: Nasopharyngeal Swab  Result Value Ref Range Status   SARS Coronavirus 2  NEGATIVE NEGATIVE Final    Comment: (NOTE) SARS-CoV-2 target nucleic acids are NOT DETECTED. The SARS-CoV-2 RNA is generally detectable in upper and lower respiratory specimens during the acute phase of infection. Negative results do not preclude SARS-CoV-2 infection, do not rule out co-infections with other pathogens, and should not be used as the sole basis for treatment or other patient management decisions. Negative results must be combined with clinical observations, patient history, and epidemiological information. The expected result is Negative. Fact Sheet for Patients: SugarRoll.be Fact Sheet for Healthcare Providers: https://www.woods-mathews.com/ This test is not yet approved or cleared by the [place] FDA and  has been authorized for detection and/or diagnosis of SARS-CoV-2 by FDA under an Emergency Use Authorization (EUA). This EUA will remain  in effect (meaning this test can be used) for the duration of the COVID-19 declaration under Section 56 4(b)(1) of the Act, 21 U.S.C. section 360bbb-3(b)(1), unless the authorization is terminated or revoked sooner. Performed at [facility], [place] [address], [place], [place] [address]   Surgical pcr screen     Status: Abnormal   Collection Time: 04/14/19 12:06 AM   Specimen: Nasal Mucosa; Nasal Swab  Result Value Ref Range Status   MRSA, PCR POSITIVE (A) NEGATIVE Final   Staphylococcus aureus POSITIVE (A) NEGATIVE Final    Comment: RESULT CALLED TO, READ BACK BY AND VERIFIED WITHRex Kras RN 04/14/19 [id number] JDW (NOTE) The Xpert SA Assay (FDA approved for NASAL specimens in patients 9 years of age and older), is one component of a comprehensive surveillance program. It is not intended to diagnose infection nor to guide or monitor treatment. Performed at [facility], [place] [address] [place], [place] [address]       Radiology Studies: Pelvis Portable  Result  Date: 04/14/2019 CLINICAL DATA:  Status post ORIF of left hip fracture EXAM: PORTABLE PELVIS 1-2 VIEWS COMPARISON:  04/13/19, intraoperative films from earlier in the same day. FINDINGS: Proximal medullary rod is noted in the left femur with fixation screws both proximally and distally. Fracture fragments are in anatomic alignment. No other focal abnormality is seen. IMPRESSION: Status post ORIF of left femoral fracture. Electronically Signed   By: Inez Catalina M.D.   On: 04/14/2019 15:59   DG C-Arm 1-60 Min  Result Date: 04/14/2019 CLINICAL DATA:  ORIF hip fracture EXAM: OPERATIVE left HIP (WITH PELVIS IF PERFORMED) 2 VIEWS TECHNIQUE: Fluoroscopic spot image(s) were submitted for interpretation post-operatively. COMPARISON:  04/13/2019 FINDINGS: Gamma nail fixation of an intertrochanteric fracture. Components appear well positioned. Alignment is anatomic. Distal locking screw  utilized. IMPRESSION: Good appearance following ORIF for intertrochanteric fracture. Electronically Signed   By: Nelson Chimes M.D.   On: 04/14/2019 15:35   DG HIP OPERATIVE UNILAT WITH PELVIS LEFT  Result Date: 04/14/2019 CLINICAL DATA:  ORIF hip fracture EXAM: OPERATIVE left HIP (WITH PELVIS IF PERFORMED) 2 VIEWS TECHNIQUE: Fluoroscopic spot image(s) were submitted for interpretation post-operatively. COMPARISON:  04/13/2019 FINDINGS: Gamma nail fixation of an intertrochanteric fracture. Components appear well positioned. Alignment is anatomic. Distal locking screw utilized. IMPRESSION: Good appearance following ORIF for intertrochanteric fracture. Electronically Signed   By: Nelson Chimes M.D.   On: 04/14/2019 15:35     Scheduled Meds: . Chlorhexidine Gluconate Cloth  6 each Topical Daily  . collagenase  1 application Topical Daily  . digoxin  0.125 mg Oral Daily  . docusate sodium  100 mg Oral BID  . enoxaparin (LOVENOX) injection  40 mg Subcutaneous Q24H  . feeding supplement (PRO-STAT SUGAR FREE 64)  30 mL Oral BID BM    . finasteride  5 mg Oral Daily  . fluticasone furoate-vilanterol  1 puff Inhalation Daily  . latanoprost  1 drop Both Eyes QHS  . metoprolol tartrate  25 mg Oral BID  . multivitamin with minerals  1 tablet Oral Daily  . mupirocin ointment  1 application Nasal BID  . spironolactone  25 mg Oral Daily  . tamsulosin  0.4 mg Oral QHS  . torsemide  20 mg Oral BID BM   Continuous Infusions: . sodium chloride 30 mL/hr at 04/14/19 1850     LOS: 2 days   Time Spent in minutes   30 minutes  Eldoris Beiser D.O. on 04/15/2019 at 12:13 PM  Between 7am to 7pm - Please see pager noted on [email or web address]  After 7pm go to [email or web address]  And look for the night coverage person covering for me after hours  [facility] Office  [phone]

## 2019-04-15 NOTE — Evaluation (Addendum)
Physical Therapy Evaluation Patient Details Name: Curtis [name] MRN: [id number] DOB: 23-May-1945 Today's Date: 04/15/2019   History of Present Illness  Pt is a 73 y.o. male, with history of chronic wounds on bilateral heels and buttocks, pulmonary asbestosis, COPD, peripheral vascular disease, coronary artery disease status post MI in 2015, IBS, hypertension, hyperlipidemia, glaucoma, GERD, diverticulosis, history of AAA and L LE RSD presents to the ED after a fall resulting in L femur fx, underwent IM nail.     Clinical Impression  Pt presented supine in bed with HOB elevated, awake and willing to participate in therapy session. Prior to admission, pt reported that he primarily used a w/c for mobility and could perform transfers independently. Pt lives alone but has his son available intermittently for assistance. At the time of evaluation, pt very limited secondary to pain and fatigue. He required max A x2 for bed mobility and was able to sit EOB without UE support for ~10 minutes. Pt refusing to attempt transfers this session. Pt would continue to benefit from skilled physical therapy services at this time while admitted and after d/c to address the below listed limitations in order to improve overall safety and independence with functional mobility.  Of note, pt on 4L of O2 throughout with SPO2 decreasing to as low as 80% with activity, returning to 90% at rest in supine.     Follow Up Recommendations SNF    Equipment Recommendations  None recommended by PT    Recommendations for Other Services       Precautions / Restrictions Precautions Precautions: Fall Precaution Comments: on 4L home 02, baseline pain in L LE from RSD Restrictions Weight Bearing Restrictions: Yes LLE Weight Bearing: Weight bearing as tolerated      Mobility  Bed Mobility Overal bed mobility: Needs Assistance Bed Mobility: Supine to Sit;Sit to Supine     Supine to sit: +2 for physical assistance;Max  assist Sit to supine: +2 for physical assistance;Max assist   General bed mobility comments: increased time and effort, cues for sequencing, assist for LEs and trunk  Transfers                 General transfer comment: refused attempt to stand  Ambulation/Gait                Stairs            Wheelchair Mobility    Modified Rankin (Stroke Patients Only)       Balance Overall balance assessment: Needs assistance Sitting-balance support: Feet supported Sitting balance-Leahy Scale: Fair                                       Pertinent Vitals/Pain Pain Assessment: Faces Faces Pain Scale: Hurts even more Pain Location: L LE Pain Descriptors / Indicators: Grimacing;Guarding;Sore Pain Intervention(s): Monitored during session;Repositioned    Home Living Family/patient expects to be discharged to:: Skilled nursing facility                      Prior Function Level of Independence: Needs assistance   Gait / Transfers Assistance Needed: transfers to w/c, propels his own w/c in his home  ADL's / Homemaking Assistance Needed: reports he sponge bathes unless his son is there to assist with showering, can dress himself and prepare convenience foods        Hand Dominance   Dominant  Hand: Right    Extremity/Trunk Assessment   Upper Extremity Assessment Upper Extremity Assessment: Difficult to assess due to impaired cognition;Defer to OT evaluation    Lower Extremity Assessment Lower Extremity Assessment: Difficult to assess due to impaired cognition;LLE deficits/detail LLE Deficits / Details: pt with decreased strength and ROM limitations secondary to post-op pain and weakness       Communication   Communication: No difficulties;Other (comment)(slow to respond)  Cognition Arousal/Alertness: Awake/alert Behavior During Therapy: Flat affect Overall Cognitive Status: No family/caregiver present to determine baseline cognitive  functioning Area of Impairment: Orientation;Attention;Memory;Following commands;Safety/judgement;Awareness;Problem solving                 Orientation Level: Disoriented to;Situation Current Attention Level: Focused Memory: Decreased short-term memory Following Commands: Follows one step commands with increased time;Follows one step commands inconsistently Safety/Judgement: Decreased awareness of deficits Awareness: Intellectual Problem Solving: Slow processing;Decreased initiation;Difficulty sequencing;Requires verbal cues;Requires tactile cues        General Comments      Exercises     Assessment/Plan    PT Assessment Patient needs continued PT services  PT Problem List Decreased strength;Decreased range of motion;Decreased activity tolerance;Decreased balance;Decreased mobility;Decreased coordination;Decreased knowledge of use of DME;Decreased safety awareness;Decreased knowledge of precautions;Pain       PT Treatment Interventions DME instruction;Gait training;Stair training;Functional mobility training;Therapeutic activities;Therapeutic exercise;Balance training;Neuromuscular re-education;Patient/family education    PT Goals (Current goals can be found in the Care Plan section)  Acute Rehab PT Goals Patient Stated Goal: agreeable to sitting at EOB PT Goal Formulation: With patient Time For Goal Achievement: 04/29/19 Potential to Achieve Goals: Fair    Frequency Min 3X/week   Barriers to discharge        Co-evaluation PT/OT/SLP Co-Evaluation/Treatment: Yes Reason for Co-Treatment: For patient/therapist safety;To address functional/ADL transfers   OT goals addressed during session: ADL's and self-care       AM-PAC PT "6 Clicks" Mobility  Outcome Measure Help needed turning from your back to your side while in a flat bed without using bedrails?: Total Help needed moving from lying on your back to sitting on the side of a flat bed without using bedrails?:  Total Help needed moving to and from a bed to a chair (including a wheelchair)?: Total Help needed standing up from a chair using your arms (e.g., wheelchair or bedside chair)?: Total Help needed to walk in hospital room?: Total Help needed climbing 3-5 steps with a railing? : Total 6 Click Score: 6    End of Session   Activity Tolerance: Patient limited by pain;Patient limited by fatigue Patient left: in bed;with call bell/phone within reach;with bed alarm set;Other (comment)(RN present in room) Nurse Communication: Mobility status PT Visit Diagnosis: Other abnormalities of gait and mobility (R26.89);Pain Pain - Right/Left: Left Pain - part of body: Hip    Time: 1020-1055 PT Time Calculation (min) (ACUTE ONLY): 35 min   Charges:   PT Evaluation $PT Eval Moderate Complexity: 1 Mod          Eduard Clos, PT, DPT  Acute Rehabilitation Services Pager [phone] Office [place] 04/15/2019, 1:13 PM

## 2019-04-15 NOTE — Evaluation (Signed)
Occupational Therapy Evaluation Patient Details Name: Curtis [name] MRN: [id number] DOB: [birth year] Today's Date: 04/15/2019    History of Present Illness Curtis [name]  is a 73 y.o. male, with history of pulmonary asbestosis, COPD, peripheral vascular disease, coronary artery disease status post MI in 2015, IBS, hypertension, hyperlipidemia, glaucoma, GERD, diverticulosis, history of AAA and L LE RSD presents to the ED after a fall resulting in L femur fx, underwent IM nail.    Clinical Impression   Pt reports transferring and propelling his w/c independently. He was able to perform sponge bathing and dressing independently and prepare a simple meal. His son lives next door. He admits to spending much of his time in the bed watching tv. Pt presents with impaired cognition, generalized weakness and inability to stand. He requires 2 person assist to for bed mobility and declined, but is unlikely to be able to stand. Pt will need SNF for further rehab. Will follow acutely.    Follow Up Recommendations  SNF;Supervision/Assistance - 24 hour    Equipment Recommendations  Other (comment)(defer to next venue)    Recommendations for Other Services       Precautions / Restrictions Precautions Precautions: Fall Precaution Comments: on 4L home 02, baseline pain in L LE from RSD Restrictions Weight Bearing Restrictions: Yes LLE Weight Bearing: Weight bearing as tolerated      Mobility Bed Mobility Overal bed mobility: Needs Assistance Bed Mobility: Supine to Sit;Sit to Supine     Supine to sit: +2 for physical assistance;Max assist Sit to supine: +2 for physical assistance;Max assist   General bed mobility comments: increased time and effort, cues for sequencing, assist for LEs and trunk  Transfers                 General transfer comment: refused attempt to stand    Balance Overall balance assessment: Needs assistance   Sitting balance-Leahy Scale: Fair                                      ADL either performed or assessed with clinical judgement   ADL Overall ADL's : Needs assistance/impaired Eating/Feeding: Set up;Sitting   Grooming: Set up;Sitting   Upper Body Bathing: Moderate assistance;Sitting   Lower Body Bathing: Total assistance;+2 for physical assistance;Bed level   Upper Body Dressing : Minimal assistance;Sitting   Lower Body Dressing: Total assistance;Bed level       Toileting- Clothing Manipulation and Hygiene: Total assistance;+2 for physical assistance;Bed level Toileting - Clothing Manipulation Details (indicate cue type and reason): pt incontinent of bowel     Functional mobility during ADLs: (unable to ambulate)       Vision Patient Visual Report: No change from baseline       Perception     Praxis      Pertinent Vitals/Pain Pain Assessment: Faces Faces Pain Scale: Hurts even more Pain Location: L LE Pain Descriptors / Indicators: Grimacing;Guarding;Sore Pain Intervention(s): Monitored during session;Repositioned;Limited activity within patient's tolerance     Hand Dominance Right   Extremity/Trunk Assessment Upper Extremity Assessment Upper Extremity Assessment: Overall WFL for tasks assessed   Lower Extremity Assessment Lower Extremity Assessment: Defer to PT evaluation       Communication Communication Communication: No difficulties(slow to respond)   Cognition Arousal/Alertness: Awake/alert Behavior During Therapy: Flat affect Overall Cognitive Status: No family/caregiver present to determine baseline cognitive functioning Area of Impairment: Orientation;Attention;Memory;Following commands;Safety/judgement;Awareness;Problem solving  Orientation Level: Disoriented to;Situation Current Attention Level: Focused Memory: Decreased short-term memory Following Commands: Follows one step commands with increased time;Follows one step commands  inconsistently Safety/Judgement: Decreased awareness of deficits Awareness: Intellectual Problem Solving: Slow processing;Decreased initiation;Difficulty sequencing;Requires verbal cues;Requires tactile cues     General Comments       Exercises     Shoulder Instructions      Home Living Family/patient expects to be discharged to:: Skilled nursing facility                                        Prior Functioning/Environment Level of Independence: Needs assistance  Gait / Transfers Assistance Needed: transfers to w/c, propels his own w/c in his home ADL's / Homemaking Assistance Needed: reports he sponge bathes unless his son is there to assist with showering, can dress himself and prepare convenience foods            OT Problem List: Decreased strength;Decreased activity tolerance;Impaired balance (sitting and/or standing);Decreased cognition;Decreased safety awareness;Decreased knowledge of use of DME or AE;Pain      OT Treatment/Interventions: Self-care/ADL training;DME and/or AE instruction;Cognitive remediation/compensation;Therapeutic activities;Patient/family education;Balance training    OT Goals(Current goals can be found in the care plan section) Acute Rehab OT Goals Patient Stated Goal: agreeable to sitting at EOB OT Goal Formulation: With patient Time For Goal Achievement: 04/29/19 Potential to Achieve Goals: Good ADL Goals Pt Will Perform Grooming: with supervision;sitting Pt Will Perform Upper Body Bathing: with supervision;sitting Pt Will Perform Upper Body Dressing: with supervision;sitting Pt Will Transfer to Toilet: with mod assist;bedside commode;squat pivot transfer Additional ADL Goal #1: Pt will perform bed mobility with min assist in preparation for ADL.  OT Frequency: Min 2X/week   Barriers to D/C:            Co-evaluation PT/OT/SLP Co-Evaluation/Treatment: Yes Reason for Co-Treatment: For patient/therapist safety   OT goals  addressed during session: ADL's and self-care      AM-PAC OT "6 Clicks" Daily Activity     Outcome Measure Help from another person eating meals?: A Little Help from another person taking care of personal grooming?: A Little Help from another person toileting, which includes using toliet, bedpan, or urinal?: Total Help from another person bathing (including washing, rinsing, drying)?: A Lot Help from another person to put on and taking off regular upper body clothing?: A Little Help from another person to put on and taking off regular lower body clothing?: Total 6 Click Score: 13   End of Session Equipment Utilized During Treatment: Oxygen(4L)  Activity Tolerance: Patient limited by pain Patient left: in bed;with call bell/phone within reach;with nursing/sitter in room  OT Visit Diagnosis: Pain;Other symptoms and signs involving the nervous system (R29.898);History of falling (Z91.81);Muscle weakness (generalized) (M62.81)                Time: [id number] OT Time Calculation (min): 37 min Charges:  OT General Charges $OT Visit: 1 Visit OT Evaluation $OT Eval Moderate Complexity: 1 Mod  Nestor Lewandowsky, OTR/L Acute Rehabilitation Services Pager: [phone] Office: [phone]  [name] 04/15/2019, 12:54 PM

## 2019-04-15 NOTE — Progress Notes (Signed)
Pt's son was speaking with this RN about his concerns about the pt's cognition decline over the past three months. He stated pt was "living home alone and getting around just fine, taking his medicines on time" when he started to fall a lot at home. Michela Pitcher pt has been to [facility] multiple times and was discharged to the [facility] center for rehab and then discharged from there home. At home he said pt was fine for a day or two but then became more confused and falling more which resulted in his currently hospitalization with a broken hip. Son, Elta Guadeloupe, said that he called pt's primary doctor today over concern. Was worried that there is something else going on that the doctors are not catching. Informed MD that pt's son would like to speak with her and she called into room but pt's son was not in room when she called. Pt's son was back in room when RN went in and he said that he spoke with the pt's primary doctor.

## 2019-04-16 LAB — MAGNESIUM: Magnesium: 1.7 mg/dL (ref 1.7–2.4)

## 2019-04-16 LAB — BLOOD GAS, ARTERIAL
Acid-Base Excess: 9.5 mmol/L — ABNORMAL HIGH (ref 0.0–2.0)
Bicarbonate: 33.4 mmol/L — ABNORMAL HIGH (ref 20.0–28.0)
FIO2: 36
O2 Saturation: 98.5 %
Patient temperature: 37
pCO2 arterial: 43.7 mmHg (ref 32.0–48.0)
pH, Arterial: 7.495 — ABNORMAL HIGH (ref 7.350–7.450)
pO2, Arterial: 107 mmHg (ref 83.0–108.0)

## 2019-04-16 LAB — BASIC METABOLIC PANEL
Anion gap: 12 (ref 5–15)
BUN: 33 mg/dL — ABNORMAL HIGH (ref 8–23)
CO2: 32 mmol/L (ref 22–32)
Calcium: 9.6 mg/dL (ref 8.9–10.3)
Chloride: 93 mmol/L — ABNORMAL LOW (ref 98–111)
Creatinine, Ser: 1.06 mg/dL (ref 0.61–1.24)
GFR calc Af Amer: >60 mL/min (ref >60)
GFR calc non Af Amer: >60 mL/min (ref >60)
Glucose, Bld: 92 mg/dL (ref 70–99)
Potassium: 3.1 mmol/L — ABNORMAL LOW (ref 3.5–5.1)
Sodium: 137 mmol/L (ref 135–145)

## 2019-04-16 LAB — CBC
HCT: 35 % — ABNORMAL LOW (ref 39.0–52.0)
Hemoglobin: 10.9 g/dL — ABNORMAL LOW (ref 13.0–17.0)
MCH: 27.9 pg (ref 26.0–34.0)
MCHC: 31.1 g/dL (ref 30.0–36.0)
MCV: 89.5 fL (ref 80.0–100.0)
Platelets: 438 10*3/uL — ABNORMAL HIGH (ref 150–400)
RBC: 3.91 MIL/uL — ABNORMAL LOW (ref 4.22–5.81)
RDW: 17 % — ABNORMAL HIGH (ref 11.5–15.5)
WBC: 11.9 10*3/uL — ABNORMAL HIGH (ref 4.0–10.5)
nRBC: 0.3 % — ABNORMAL HIGH (ref 0.0–0.2)

## 2019-04-16 LAB — TROPONIN I (HIGH SENSITIVITY)
Troponin I (High Sensitivity): 31 ng/L — ABNORMAL HIGH (ref <18)
Troponin I (High Sensitivity): 58 ng/L — ABNORMAL HIGH (ref <18)
Troponin I (High Sensitivity): 29 ng/L — ABNORMAL HIGH (ref <18)
Troponin I (High Sensitivity): 25 ng/L — ABNORMAL HIGH (ref <18)

## 2019-04-16 LAB — PHOSPHORUS: Phosphorus: 3 mg/dL (ref 2.5–4.6)

## 2019-04-16 MED ORDER — POTASSIUM CHLORIDE CRYS ER 20 MEQ PO TBCR
40.0000 meq | EXTENDED_RELEASE_TABLET | Freq: Once | ORAL | Status: AC
  Administered 2019-04-16: 40 meq via ORAL
  Filled 2019-04-16: qty 2

## 2019-04-16 MED ORDER — POTASSIUM CHLORIDE CRYS ER 20 MEQ PO TBCR
40.0000 meq | EXTENDED_RELEASE_TABLET | Freq: Once | ORAL | Status: AC
  Administered 2019-04-16: 09:00:00 40 meq via ORAL
  Filled 2019-04-16: qty 2

## 2019-04-16 MED ORDER — MAGNESIUM SULFATE 2 GM/50ML IV SOLN
2.0000 g | Freq: Once | INTRAVENOUS | Status: AC
  Administered 2019-04-16: 2 g via INTRAVENOUS
  Filled 2019-04-16: qty 50

## 2019-04-16 NOTE — Plan of Care (Signed)

## 2019-04-16 NOTE — Plan of Care (Signed)
  Problem: Education: Goal: Knowledge of General Education information will improve Description: Including pain rating scale, medication(s)/side effects and non-pharmacologic comfort measures Outcome: Progressing   Problem: Nutrition: Goal: Adequate nutrition will be maintained Outcome: Progressing   Problem: Coping: Goal: Level of anxiety will decrease Outcome: Progressing   

## 2019-04-16 NOTE — Progress Notes (Signed)
JET WAGLEY  MRN: [id number] DOB/Age: Nov 18, 1945 73 y.o. Physician: Ander Slade, M.D. 2 Days Post-Op Procedure(s) (LRB): INTRAMEDULLARY (IM) NAIL INTERTROCHANTRIC (Left)  Subjective: Resting comfortably in bed this a.m.  No new complaints. Vital Signs Temp:  [97.4 F (36.3 C)-99 F (37.2 C)] 97.4 F (36.3 C) (12/12 0824) Pulse Rate:  [81-93] 83 (12/12 0824) Resp:  [16-18] 18 (12/12 0824) BP: (154-175)/(65-82) 175/79 (12/12 0824) SpO2:  [91 %-98 %] 96 % (12/12 0824)  Lab Results Recent Labs    04/15/19 0520 04/16/19 0215  WBC 10.6* 11.9*  HGB 8.8* 10.9*  HCT 29.4* 35.0*  PLT 347 438*   BMET Recent Labs    04/15/19 0520 04/16/19 0215  NA 137 137  K 4.2 3.1*  CL 99 93*  CO2 28 32  GLUCOSE 131* 92  BUN 33* 33*  CREATININE 1.05 1.06  CALCIUM 9.2 9.6   INR  Date Value Ref Range Status  04/13/2019 1.3 (H) 0.8 - 1.2 Final    Comment:    (NOTE) INR goal varies based on device and disease states. Performed at [facility], [address] [place], [place] [address]      Exam  Left hip dressings clean and dry.  Thigh is soft.  No active ankle motion related to chronic neurologic deficit  Plan Continue progressive mobilization per postop plan. Britanee Vanblarcom M Winta Barcelo 04/16/2019, 9:25 AM    Contact # [phone]

## 2019-04-16 NOTE — Progress Notes (Signed)
Received call from telemetry last night of ST elevation >2. Patient stated that he had some slight chest pain behind his right rib area, and that occurs off and on at home. VS @ 0102 Temp: 99.0, BP 154/73, HR: 82, Resp. 16, SpO2 97% on 4L nasal cannula. Notified Traid on call MD and was advised to obtain new EKG and labs were ordered.  Received call from lab [phone] Troponin I (High Sensitivity) level of 58. On call MD notified.

## 2019-04-16 NOTE — Progress Notes (Addendum)
PROGRESS NOTE    Curtis [name]  [id number] DOB: [birth year] DOA: 04/12/2019 PCP: [name], MD   Brief Narrative:  HPI on 04/13/2019 by Dr. [name] is a 73 y.o. male with medical history significant of AAA, osteoarthritis, asbestosis, CAD, history of NSTEMI, cancer, depression, diverticulosis,/diabetic colitis, GERD, glaucoma, abdominal hernia, hyperlipidemia, hypertension, IBS, peripheral vascular disease, history of pneumonia, reflex sympathetic dystrophy, stage III and IV decubiti ulcers who presented to the ER with left hip pain after a fall.  History is being obtained from the ER record as the patient has altered mental status and is unable to provide a history.  Patient sustained a fall 3 days ago at home.  He was apparently trying to sit down when he missed the chair and fell to the ground.  He normally uses a walker and a wheelchair at home.  As per the family, he had been having severe left hip pain since the fall and was unable to bear any weight on the leg.  The family brought him into the ER today.  Assessment & Plan   Left intertrochanteric femur fracture -Status post mechanical fall 3 days prior to admission.  Patient has been unable to bear weight on the left side. -Orthopedic surgery consulted and appreciated, s/p Intramedullary fixation, Left femur.  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  Suspect patient may have some underlying dementia, and should follow-up with a neurologist.  Atrial flutter, chronic -The patient has been taking Eliquis at home which is been held for possible surgery -Echocardiogram 01/02/2019 showed an EF of 55 to 60%, no significant valvular disease or systolic dysfunction -Continue metoprolol, digoxin  Coronary artery disease -Status post DES x2 to L circumflex in 2015 -Currently stable, no complaints of chest pain  History of reflux sympathetic dystrophy -Chronic physical deconditioning amatory dysfunction secondary to the above -At baseline, patient is able to ambulate with the use of a walker and uses a wheelchair -PT and OT consulted recommended SNF  BPH -Continue tamsulosin, finasteride  Chronic normocytic anemia -Hemoglobin currently 8.8, appears to be stable at baseline -Continue to monitor CBC  Depression/anxiety -Continue amitriptyline, Xanax as needed  Chronic decubitus ulcers -Present on admission, patient has chronic ulcerations on bilateral heels (stage II-III) as well as left buttock (unstageable) -Wound care consulted  Questionable chest pain -It seems that overnight, telemetry noted that patient had ST elevation.  Overnight coverage was called and high-sensitivity troponin was obtained.  Troponin peaked at 58, and now trending downward.  Troponins not consistent with ACS.  Patient currently not having chest pain.  DVT Prophylaxis  SCDs  Code Status: Full  Family Communication: None at bedside  Disposition Plan: Admitted.  Dispo to SNF  Consultants Orthopedic surgery  Procedures  Intramedullary fixation, Left femur.   Antibiotics   Anti-infectives (From admission, onward)   Start     Dose/Rate Route Frequency Ordered Stop   04/15/19 0600  ceFAZolin (ANCEF) IVPB 2g/100 mL premix  Status:  Discontinued     2  g 200 mL/hr over 30 Minutes Intravenous To Short Stay 04/14/19 1251 04/14/19 1259   04/15/19 0000  vancomycin (VANCOCIN) IVPB 1000 mg/200 mL premix     1,000 mg 200 mL/hr over 60 Minutes Intravenous Every 12 hours 04/14/19 1700 04/15/19 0154   04/14/19 1315  vancomycin (VANCOCIN) IVPB 1000 mg/200 mL premix     1,000 mg 200 mL/hr over 60 Minutes Intravenous  Once 04/14/19 1304 04/14/19 1413   04/14/19 1300  ceFAZolin (ANCEF) IVPB 2g/100 mL premix     2 g 200 mL/hr over 30 Minutes Intravenous To Short Stay 04/14/19 1259 04/14/19 1403   04/14/19 1252  ceFAZolin (ANCEF) 2-4 GM/100ML-% IVPB    Note to Pharmacy: [facility]   : cabinet override      04/14/19 1252 04/14/19 1404      Subjective:   Curtis [name] seen and examined today.  Patient with no complaints this morning.  States it is too early.  Currently denies chest pain, shortness breath, abdominal pain, nausea or vomiting, diarrhea or constipation, dizziness or headache.  Patient states he needs to get to 158 in order to get home. Objective:   Vitals:   04/15/19 2144 04/16/19 0102 04/16/19 0224 04/16/19 0824  BP:  (!) 154/73 (!) 159/77 (!) 175/79  Pulse: 93 82 81 83  Resp:  16  18  Temp:  99 F (37.2 C) 98.2 F (36.8 C) (!) 97.4 F (36.3 C)  TempSrc:  Oral Oral Oral  SpO2:  98% 97% 96%  Weight:      Height:        Intake/Output Summary (Last 24 hours) at 04/16/2019 1101 Last data filed at 04/16/2019 1024 Gross per 24 hour  Intake 240 ml  Output 2000 ml  Net -1760 ml   Filed Weights   04/12/19 2252 04/14/19 0616  Weight: 82.1 kg 81 kg   Exam  General: Well developed, well nourished, NAD, appears stated age  HEENT: NCAT, mucous membranes moist.   Cardiovascular: S1 S2 auscultated, irregular  Respiratory: Diminished breath sounds, however clear  Abdomen: Soft, nontender, nondistended, + bowel sounds  Extremities: warm dry without cyanosis clubbing or edema  Neuro: AAOx4, nonfocal, however does make  some odd comments occasionally  Psych: Pleasant, appropriate mood and affect   Data Reviewed: I have personally reviewed following labs and imaging studies  CBC: Recent Labs  Lab 04/13/19 0027 04/13/19 0650 04/14/19 1954 04/15/19 0520 04/16/19 0215  WBC 14.9* 13.5* 11.0* 10.6* 11.9*  NEUTROABS 12.0*  --   --   --   --   HGB 9.7* 9.2* 9.4* 8.8* 10.9*  HCT 32.9* 31.7* 31.1* 29.4* 35.0*  MCV 94.5 95.5 92.0 92.2 89.5  PLT 341 331 338 347 [id number]*   Basic Metabolic Panel: Recent Labs  Lab 04/13/19 0027 04/13/19 0650 04/14/19 1954 04/15/19 0520 04/16/19 0215  NA 136 137  --  137 137  K 4.9 4.8  --  4.2 3.1*  CL 97* 98  --  99 93*  CO2 28 29  --  28 32  GLUCOSE 142* 155*  --  131* 92  BUN 39* 40*  --  33* 33*  CREATININE 1.50* 1.31* 1.04 1.05 1.06  CALCIUM 9.6 9.6  --  9.2 9.6  MG  --   --   --   --  1.7  PHOS  --   --   --   --  3.0   GFR: Estimated Creatinine Clearance: 67.1 mL/min (by C-G formula based on SCr of 1.06 mg/dL). Liver Function Tests: No results for input(s): AST, ALT, ALKPHOS, BILITOT, PROT, ALBUMIN in the last 168 hours. No results for input(s): LIPASE, AMYLASE in the last 168 hours. No results for input(s): AMMONIA in the last 168 hours. Coagulation Profile: Recent Labs  Lab 04/13/19 0027  INR 1.3*   Cardiac Enzymes: No results for input(s): CKTOTAL, CKMB, CKMBINDEX, TROPONINI in the last 168 hours. BNP (last 3 results) No results for input(s): PROBNP in the last 8760 hours. HbA1C: No results for input(s): HGBA1C in the last 72 hours. CBG: No results for input(s): GLUCAP in the last 168 hours. Lipid Profile: No results for input(s): CHOL, HDL, LDLCALC, TRIG, CHOLHDL, LDLDIRECT in the last 72 hours. Thyroid Function Tests: No results for input(s): TSH, T4TOTAL, FREET4, T3FREE, THYROIDAB in the last 72 hours. Anemia Panel: No results for input(s): VITAMINB12, FOLATE, FERRITIN, TIBC, IRON, RETICCTPCT in the last 72 hours. Urine analysis:      Component Value Date/Time   COLORURINE YELLOW 02/08/2019 2240   APPEARANCEUR CLOUDY (A) 02/08/2019 2240   LABSPEC 1.017 02/08/2019 2240   PHURINE 5.0 02/08/2019 2240   GLUCOSEU NEGATIVE 02/08/2019 2240   HGBUR MODERATE (A) 02/08/2019 2240   BILIRUBINUR NEGATIVE 02/08/2019 2240   KETONESUR NEGATIVE 02/08/2019 2240   PROTEINUR 100 (A) 02/08/2019 2240   UROBILINOGEN 0.2 02/12/2015 1859   NITRITE NEGATIVE 02/08/2019 2240   LEUKOCYTESUR LARGE (A) 02/08/2019 2240   Sepsis Labs: @LABRCNTIP (procalcitonin:4,lacticidven:4)  ) Recent Results (from the past 240 hour(s))  SARS CORONAVIRUS 2 (TAT 6-24 HRS) Nasopharyngeal Nasopharyngeal Swab     Status: None   Collection Time: 04/13/19  2:13 AM   Specimen: Nasopharyngeal Swab  Result Value Ref Range Status   SARS Coronavirus 2 NEGATIVE NEGATIVE Final    Comment: (NOTE) SARS-CoV-2 target nucleic acids are NOT DETECTED. The SARS-CoV-2 RNA is generally detectable in upper and lower respiratory specimens during the acute phase of infection. Negative results do not preclude SARS-CoV-2 infection, do not rule out co-infections with other pathogens, and should not be used as the sole basis for treatment or other patient management decisions. Negative results must be combined with clinical observations, patient history, and epidemiological information. The expected result is Negative. Fact Sheet for Patients: SugarRoll.be Fact Sheet for Healthcare Providers: https://www.woods-mathews.com/ This test is not yet approved or cleared by the [place] FDA and  has been authorized for detection and/or diagnosis of SARS-CoV-2 by FDA under an Emergency Use Authorization (EUA). This EUA will remain  in effect (meaning this test can be used) for the duration of the COVID-19 declaration under Section 56 4(b)(1) of the Act, 21 U.S.C. section 360bbb-3(b)(1), unless the authorization is terminated or revoked  sooner. Performed at [facility], [place] [address], [place], [place] [address]   Surgical pcr screen     Status: Abnormal   Collection Time: 04/14/19 12:06 AM   Specimen: Nasal Mucosa; Nasal Swab  Result Value Ref Range Status   MRSA, PCR POSITIVE (A) NEGATIVE Final   Staphylococcus aureus POSITIVE (A) NEGATIVE Final    Comment: RESULT CALLED TO, READ BACK BY AND VERIFIED WITHRex Kras RN 04/14/19 [id number] (NOTE) The Xpert SA Assay (FDA approved for NASAL specimens in patients 58 years of age and older), is one component of a comprehensive surveillance program. It is not intended to diagnose infection nor to guide or monitor treatment. Performed at [facility], [address], [place], [place] [address]       Radiology Studies: Pelvis Portable  Result Date: 04/14/2019 CLINICAL DATA:  Status post ORIF of left hip fracture EXAM: PORTABLE PELVIS 1-2 VIEWS COMPARISON:  04/13/19, intraoperative films from earlier in the same day. FINDINGS: Proximal medullary rod is noted in the left femur with fixation screws both proximally and distally. Fracture fragments are in anatomic alignment. No other focal abnormality is seen. IMPRESSION: Status post ORIF of left femoral fracture. Electronically Signed   By: Inez Catalina M.D.   On: 04/14/2019 15:59   DG C-Arm 1-60 Min  Result Date: 04/14/2019 CLINICAL DATA:  ORIF hip fracture EXAM: OPERATIVE left HIP (WITH PELVIS IF PERFORMED) 2 VIEWS TECHNIQUE: Fluoroscopic spot image(s) were submitted for interpretation post-operatively. COMPARISON:  04/13/2019 FINDINGS: Gamma nail fixation of an intertrochanteric fracture. Components appear well positioned. Alignment is anatomic. Distal locking screw utilized. IMPRESSION: Good appearance following ORIF for intertrochanteric fracture. Electronically Signed   By: Nelson Chimes M.D.   On: 04/14/2019 15:35   DG HIP OPERATIVE UNILAT WITH PELVIS LEFT  Result Date: 04/14/2019 CLINICAL DATA:  ORIF  hip fracture EXAM: OPERATIVE left HIP (WITH PELVIS IF PERFORMED) 2 VIEWS TECHNIQUE: Fluoroscopic spot image(s) were submitted for interpretation post-operatively. COMPARISON:  04/13/2019 FINDINGS: Gamma nail fixation of an intertrochanteric fracture. Components appear well positioned. Alignment is anatomic. Distal locking screw utilized. IMPRESSION: Good appearance following ORIF for intertrochanteric fracture. Electronically Signed   By: Nelson Chimes M.D.   On: 04/14/2019 15:35     Scheduled Meds: . Chlorhexidine Gluconate Cloth  6 each Topical Daily  . collagenase  1 application Topical Daily  . digoxin  0.125 mg Oral Daily  . docusate sodium  100 mg Oral BID  . enoxaparin (LOVENOX) injection  40 mg Subcutaneous Q24H  . feeding supplement (PRO-STAT SUGAR FREE 64)  30 mL Oral BID BM  . finasteride  5 mg Oral Daily  . fluticasone furoate-vilanterol  1 puff Inhalation Daily  . latanoprost  1 drop Both Eyes QHS  . metoprolol tartrate  25 mg Oral BID  . multivitamin with minerals  1 tablet Oral Daily  . mupirocin ointment  1 application Nasal BID  . spironolactone  25 mg Oral Daily  . tamsulosin  0.4 mg Oral QHS  . torsemide  20 mg Oral BID BM   Continuous Infusions: . sodium chloride 30 mL/hr at 04/14/19 1850     LOS: 3 days   Time Spent in minutes   30 minutes  Lamisha Roussell D.O. on 04/16/2019 at 11:01 AM  Between 7am to 7pm - Please see pager noted on [email or web address]  After 7pm go to [email or web address]  And look for the night coverage person covering for me after hours  [facility] Office  [phone]

## 2019-04-17 LAB — BASIC METABOLIC PANEL
Anion gap: 15 (ref 5–15)
BUN: 31 mg/dL — ABNORMAL HIGH (ref 8–23)
CO2: 30 mmol/L (ref 22–32)
Calcium: 9.5 mg/dL (ref 8.9–10.3)
Chloride: 95 mmol/L — ABNORMAL LOW (ref 98–111)
Creatinine, Ser: 0.93 mg/dL (ref 0.61–1.24)
GFR calc Af Amer: >60 mL/min (ref >60)
GFR calc non Af Amer: >60 mL/min (ref >60)
Glucose, Bld: 127 mg/dL — ABNORMAL HIGH (ref 70–99)
Potassium: 3.8 mmol/L (ref 3.5–5.1)
Sodium: 140 mmol/L (ref 135–145)

## 2019-04-17 LAB — URINALYSIS, ROUTINE W REFLEX MICROSCOPIC
Bilirubin Urine: NEGATIVE
Glucose, UA: NEGATIVE mg/dL
Hgb urine dipstick: NEGATIVE
Ketones, ur: 5 mg/dL — AB
Leukocytes,Ua: NEGATIVE
Nitrite: NEGATIVE
Protein, ur: NEGATIVE mg/dL
Specific Gravity, Urine: 1.011 (ref 1.005–1.030)
pH: 5 (ref 5.0–8.0)

## 2019-04-17 LAB — CBC
HCT: 35 % — ABNORMAL LOW (ref 39.0–52.0)
Hemoglobin: 11 g/dL — ABNORMAL LOW (ref 13.0–17.0)
MCH: 27.6 pg (ref 26.0–34.0)
MCHC: 31.4 g/dL (ref 30.0–36.0)
MCV: 87.7 fL (ref 80.0–100.0)
Platelets: 395 10*3/uL (ref 150–400)
RBC: 3.99 MIL/uL — ABNORMAL LOW (ref 4.22–5.81)
RDW: 17.4 % — ABNORMAL HIGH (ref 11.5–15.5)
WBC: 9.2 10*3/uL (ref 4.0–10.5)
nRBC: 0.2 % (ref 0.0–0.2)

## 2019-04-17 LAB — MAGNESIUM: Magnesium: 2.2 mg/dL (ref 1.7–2.4)

## 2019-04-17 MED ORDER — TORSEMIDE 20 MG PO TABS
20.0000 mg | ORAL_TABLET | Freq: Two times a day (BID) | ORAL | Status: DC
  Administered 2019-04-17 – 2019-04-19 (×5): 20 mg via ORAL
  Filled 2019-04-17 (×7): qty 1

## 2019-04-17 NOTE — Progress Notes (Signed)
PROGRESS NOTE    [name]  [id number] DOB: November 07, 1945 DOA: 04/12/2019 PCP: [name], MD   Brief Narrative:  HPI on 04/13/2019 by Dr. [name] is a 73 y.o. male with medical history significant of AAA, osteoarthritis, asbestosis, CAD, history of NSTEMI, cancer, depression, diverticulosis,/diabetic colitis, GERD, glaucoma, abdominal hernia, hyperlipidemia, hypertension, IBS, peripheral vascular disease, history of pneumonia, reflex sympathetic dystrophy, stage III and IV decubiti ulcers who presented to the ER with left hip pain after a fall.  History is being obtained from the ER record as the patient has altered mental status and is unable to provide a history.  Patient sustained a fall 3 days ago at home.  He was apparently trying to sit down when he missed the chair and fell to the ground.  He normally uses a walker and a wheelchair at home.  As per the family, he had been having severe left hip pain since the fall and was unable to bear any weight on the leg.  The family brought him into the ER today.  Assessment & Plan   Left intertrochanteric femur fracture -Status post mechanical fall 3 days prior to admission.  Patient has been unable to bear weight on the left side. -Orthopedic surgery consulted and appreciated, s/p Intramedullary fixation, Left femur.  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  Suspect patient may have some underlying dementia, and should follow-up with a neurologist. -Discussed with patient's son via phone on 04/16/2019, patient son did speak with his PCP, and PCP does feel patient has dementia  Atrial flutter, chronic -The patient has been taking Eliquis at home which is been held for possible surgery -Echocardiogram 01/02/2019 showed an EF of 55 to 60%, no significant valvular disease or systolic dysfunction -Continue metoprolol, digoxin  Coronary artery disease -Status post DES x2 to L circumflex in 2015 -Currently stable, no complaints of chest pain  History of reflux sympathetic dystrophy -Chronic physical deconditioning amatory dysfunction secondary to the above -At baseline, patient is able to ambulate with the use of a walker and uses a wheelchair -PT and OT consulted recommended SNF  BPH -Continue tamsulosin, finasteride  Chronic normocytic anemia -Hemoglobin currently 11, appears to be stable at baseline -Continue to monitor CBC  Depression/anxiety -Continue amitriptyline, Xanax as needed  Chronic decubitus ulcers -Present on admission, patient has chronic ulcerations on bilateral heels (stage II-III) as well as left buttock (unstageable) -Wound care consulted  Questionable chest pain -It seems that overnight, telemetry noted that patient had ST elevation.  Overnight coverage was called and high-sensitivity troponin was obtained.  Troponin peaked at 58, and now trending downward.  Troponins not consistent with ACS.  Patient currently not having chest pain.  DVT Prophylaxis  SCDs  Code Status: Full  Family Communication: None at bedside  Disposition Plan: Admitted.  Dispo to SNF- suspect in 1-2 days  Consultants Orthopedic surgery  Procedures  Intramedullary fixation, Left femur.   Antibiotics   Anti-infectives (From admission, onward)   Start  Dose/Rate Route Frequency Ordered Stop   04/15/19 0600  ceFAZolin  (ANCEF) IVPB 2g/100 mL premix  Status:  Discontinued     2 g 200 mL/hr over 30 Minutes Intravenous To Short Stay 04/14/19 1251 04/14/19 1259   04/15/19 0000  vancomycin (VANCOCIN) IVPB 1000 mg/200 mL premix     1,000 mg 200 mL/hr over 60 Minutes Intravenous Every 12 hours 04/14/19 1700 04/15/19 0154   04/14/19 1315  vancomycin (VANCOCIN) IVPB 1000 mg/200 mL premix     1,000 mg 200 mL/hr over 60 Minutes Intravenous  Once 04/14/19 1304 04/14/19 1413   04/14/19 1300  ceFAZolin (ANCEF) IVPB 2g/100 mL premix     2 g 200 mL/hr over 30 Minutes Intravenous To Short Stay 04/14/19 1259 04/14/19 1403   04/14/19 1252  ceFAZolin (ANCEF) 2-4 GM/100ML-% IVPB    Note to Pharmacy: [facility]   : cabinet override      04/14/19 1252 04/14/19 1404      Subjective:   [name] seen and examined today.  Patient with no complaints this morning.  Does not feel like speaking with me.  Shakes his head and asked me to speak to the nurse. Objective:   Vitals:   04/16/19 1339 04/16/19 2134 04/17/19 0359 04/17/19 0817  BP: (!) 171/71 (!) 172/80 (!) 164/80 (!) 162/76  Pulse: 79 84 (!) 59 82  Resp: 18 18  18   Temp: 98.2 F (36.8 C) 98.7 F (37.1 C) 98.8 F (37.1 C) 98.6 F (37 C)  TempSrc: Oral Oral Oral   SpO2: 96% 99% 97% 93%  Weight:      Height:        Intake/Output Summary (Last 24 hours) at 04/17/2019 1134 Last data filed at 04/17/2019 0900 Gross per 24 hour  Intake 681.62 ml  Output 700 ml  Net -18.38 ml   Filed Weights   04/12/19 2252 04/14/19 0616  Weight: 82.1 kg 81 kg   Exam  General: Well developed, well nourished, elderly, NAD, appears stated age  HEENT: NCAT, mucous membranes moist.   Cardiovascular: S1 S2 auscultated, irregular  Respiratory: Diminished breath sounds, however clear  Abdomen: Soft, nontender, nondistended, + bowel sounds  Extremities: warm dry without cyanosis clubbing or edema. Left ankle weakness  Neuro: AAOx1 (self), otherwise  nonfocal  Psych: Pleasant   Data Reviewed: I have personally reviewed following labs and imaging studies  CBC: Recent Labs  Lab 04/13/19 0027 04/13/19 0650 04/14/19 1954 04/15/19 0520 04/16/19 0215 04/17/19 0418  WBC 14.9* 13.5* 11.0* 10.6* 11.9* 9.2  NEUTROABS 12.0*  --   --   --   --   --   HGB 9.7* 9.2* 9.4* 8.8* 10.9* 11.0*  HCT 32.9* 31.7* 31.1* 29.4* 35.0* 35.0*  MCV 94.5 95.5 92.0 92.2 89.5 87.7  PLT 341 331 338 347 438* [id number]   Basic Metabolic Panel: Recent Labs  Lab 04/13/19 0027 04/13/19 0650 04/14/19 1954 04/15/19 0520 04/16/19 0215 04/17/19 0418  NA 136 137  --  137 137 140  K 4.9 4.8  --  4.2 3.1* 3.8  CL 97* 98  --  99 93* 95*  CO2 28 29  --  28 32 30  GLUCOSE 142* 155*  --  131* 92 127*  BUN 39* 40*  --  33* 33* 31*  CREATININE 1.50* 1.31* 1.04 1.05 1.06 0.93  CALCIUM 9.6 9.6  --  9.2 9.6 9.5  MG  --   --   --   --  1.7 2.2  PHOS  --   --   --   --  3.0  --    GFR: Estimated Creatinine Clearance: 76.5 mL/min (by C-G formula based on SCr of 0.93 mg/dL). Liver Function Tests: No results for input(s): AST, ALT, ALKPHOS, BILITOT, PROT, ALBUMIN in the last 168 hours. No results for input(s): LIPASE, AMYLASE in the last 168 hours. No results for input(s): AMMONIA in the last 168 hours. Coagulation Profile: Recent Labs  Lab 04/13/19 0027  INR 1.3*   Cardiac Enzymes: No results for input(s): CKTOTAL, CKMB, CKMBINDEX, TROPONINI in the last 168 hours. BNP (last 3 results) No results for input(s): PROBNP in the last 8760 hours. HbA1C: No results for input(s): HGBA1C in the last 72 hours. CBG: No results for input(s): GLUCAP in the last 168 hours. Lipid Profile: No results for input(s): CHOL, HDL, LDLCALC, TRIG, CHOLHDL, LDLDIRECT in the last 72 hours. Thyroid Function Tests: No results for input(s): TSH, T4TOTAL, FREET4, T3FREE, THYROIDAB in the last 72 hours. Anemia Panel: No results for input(s): VITAMINB12, FOLATE, FERRITIN, TIBC, IRON,  RETICCTPCT in the last 72 hours. Urine analysis:    Component Value Date/Time   COLORURINE YELLOW 04/16/2019 2049   APPEARANCEUR CLEAR 04/16/2019 2049   LABSPEC 1.011 04/16/2019 2049   PHURINE 5.0 04/16/2019 2049   GLUCOSEU NEGATIVE 04/16/2019 2049   [place] NEGATIVE 04/16/2019 2049   [place] NEGATIVE 04/16/2019 2049   KETONESUR 5 (A) 04/16/2019 2049   PROTEINUR NEGATIVE 04/16/2019 2049   UROBILINOGEN 0.2 02/12/2015 1859   NITRITE NEGATIVE 04/16/2019 2049   LEUKOCYTESUR NEGATIVE 04/16/2019 2049   Sepsis Labs: @LABRCNTIP (procalcitonin:4,lacticidven:4)  ) Recent Results (from the past 240 hour(s))  SARS CORONAVIRUS 2 (TAT 6-24 HRS) Nasopharyngeal Nasopharyngeal Swab     Status: None   Collection Time: 04/13/19  2:13 AM   Specimen: Nasopharyngeal Swab  Result Value Ref Range Status   SARS Coronavirus 2 NEGATIVE NEGATIVE Final    Comment: (NOTE) SARS-CoV-2 target nucleic acids are NOT DETECTED. The SARS-CoV-2 RNA is generally detectable in upper and lower respiratory specimens during the acute phase of infection. Negative results do not preclude SARS-CoV-2 infection, do not rule out co-infections with other pathogens, and should not be used as the sole basis for treatment or other patient management decisions. Negative results must be combined with clinical observations, patient history, and epidemiological information. The expected result is Negative. Fact Sheet for Patients: SugarRoll.be Fact Sheet for Healthcare Providers: https://www.woods-mathews.com/ This test is not yet approved or cleared by the [place] FDA and  has been authorized for detection and/or diagnosis of SARS-CoV-2 by FDA under an Emergency Use Authorization (EUA). This EUA will remain  in effect (meaning this test can be used) for the duration of the COVID-19 declaration under Section 56 4(b)(1) of the Act, 21 U.S.C. section 360bbb-3(b)(1), unless the  authorization is terminated or revoked sooner. Performed at [facility], [place] [address] [place], [place] [address]   Surgical pcr screen     Status: Abnormal   Collection Time: 04/14/19 12:06 AM   Specimen: Nasal Mucosa; Nasal Swab  Result Value Ref Range Status   MRSA, PCR POSITIVE (A) NEGATIVE Final   Staphylococcus aureus POSITIVE (A) NEGATIVE Final    Comment: RESULT CALLED TO, READ BACK BY AND VERIFIED WITHRex Kras RN 04/14/19 [id number] (NOTE) The Xpert SA Assay (FDA approved for NASAL specimens in patients 66 years of age and older), is one component of a comprehensive surveillance program. It is not intended to diagnose  infection nor to guide or monitor treatment. Performed at [facility], [place] [address], [place], [place] [address]       Radiology Studies: No results found.   Scheduled Meds: . Chlorhexidine Gluconate Cloth  6 each Topical Daily  . collagenase  1 application Topical Daily  . digoxin  0.125 mg Oral Daily  . docusate sodium  100 mg Oral BID  . enoxaparin (LOVENOX) injection  40 mg Subcutaneous Q24H  . feeding supplement (PRO-STAT SUGAR FREE 64)  30 mL Oral BID BM  . finasteride  5 mg Oral Daily  . fluticasone furoate-vilanterol  1 puff Inhalation Daily  . latanoprost  1 drop Both Eyes QHS  . metoprolol tartrate  25 mg Oral BID  . multivitamin with minerals  1 tablet Oral Daily  . mupirocin ointment  1 application Nasal BID  . spironolactone  25 mg Oral Daily  . tamsulosin  0.4 mg Oral QHS  . torsemide  20 mg Oral BID BM   Continuous Infusions: . sodium chloride Stopped (04/15/19 1337)     LOS: 4 days   Time Spent in minutes   30 minutes  Lanique Gonzalo D.O. on 04/17/2019 at 11:34 AM  Between 7am to 7pm - Please see pager noted on [email or web address]  After 7pm go to [email or web address]  And look for the night coverage person covering for me after hours  [facility] Office  [phone]

## 2019-04-17 NOTE — Plan of Care (Signed)
  Problem: Activity: Goal: Risk for activity intolerance will decrease Outcome: Progressing   Problem: Nutrition: Goal: Adequate nutrition will be maintained Outcome: Progressing   Problem: Coping: Goal: Level of anxiety will decrease Outcome: Progressing   Problem: Education: Goal: Knowledge of General Education information will improve Description: Including pain rating scale, medication(s)/side effects and non-pharmacologic comfort measures Outcome: Not Progressing   Problem: Health Behavior/Discharge Planning: Goal: Ability to manage health-related needs will improve Outcome: Not Progressing

## 2019-04-17 NOTE — Progress Notes (Signed)
    Subjective: 3 Days Post-Op Procedure(s) (LRB): INTRAMEDULLARY (IM) NAIL INTERTROCHANTRIC (Left) Patient reports pain as 2 on 0-10 scale.   Denies CP or SOB.  Voiding without difficulty. Positive flatus. Objective: Vital signs in last 24 hours: Temp:  [98.2 F (36.8 C)-98.8 F (37.1 C)] 98.6 F (37 C) (12/13 0817) Pulse Rate:  [59-84] 82 (12/13 0817) Resp:  [18] 18 (12/13 0817) BP: (162-172)/(71-80) 162/76 (12/13 0817) SpO2:  [93 %-99 %] 93 % (12/13 0817)  Intake/Output from previous day: 12/12 0701 - 12/13 0700 In: 681.6 [P.O.:360; I.V.:271.6; IV Piggyback:50] Out: 400 [Urine:400] Intake/Output this shift: No intake/output data recorded.  Labs: Recent Labs    04/14/19 1954 04/15/19 0520 04/16/19 0215 04/17/19 0418  HGB 9.4* 8.8* 10.9* 11.0*   Recent Labs    04/16/19 0215 04/17/19 0418  WBC 11.9* 9.2  RBC 3.91* 3.99*  HCT 35.0* 35.0*  PLT 438* 395   Recent Labs    04/16/19 0215 04/17/19 0418  NA 137 140  K 3.1* 3.8  CL 93* 95*  CO2 32 30  BUN 33* 31*  CREATININE 1.06 0.93  GLUCOSE 92 127*  CALCIUM 9.6 9.5   No results for input(s): LABPT, INR in the last 72 hours.  Physical Exam: ABD soft Intact pulses distally Incision: dressing C/D/I and no drainage Compartment soft Dorsiflexion of the left foot is absent  Body mass index is 24.22 kg/m.   Assessment/Plan: 3 Days Post-Op Procedure(s) (LRB): INTRAMEDULLARY (IM) NAIL INTERTROCHANTRIC (Left) Advance diet Up with therapy  Positive left dorsiflexion weakness - will make primary surgeon aware Continue care  Dahlia Bailiff  [facility] [phone] 04/17/2019, 10:02 AM

## 2019-04-17 NOTE — Progress Notes (Signed)
Patient has increased in confusion since Friday night. Friday night he was alert and oriented x3-4 with confusion, tonight he is alert and oriented x1 with confusion. Not communicating much.  Face symmetrical, hand grip present/moderate.   Notified [facility]. New orders for Stat ABG and Urinalysis obtained.  Will continue to monitor.

## 2019-04-17 NOTE — Plan of Care (Signed)

## 2019-04-18 LAB — SARS CORONAVIRUS 2 (TAT 6-24 HRS): SARS Coronavirus 2: NEGATIVE

## 2019-04-18 NOTE — Plan of Care (Signed)
  Problem: Education: Goal: Knowledge of General Education information will improve Description: Including pain rating scale, medication(s)/side effects and non-pharmacologic comfort measures Outcome: Progressing   Problem: Health Behavior/Discharge Planning: Goal: Ability to manage health-related needs will improve Outcome: Progressing   Problem: Clinical Measurements: Goal: Ability to maintain clinical measurements within normal limits will improve Outcome: Progressing Goal: Will remain free from infection Outcome: Progressing   Problem: Activity: Goal: Risk for activity intolerance will decrease Outcome: Progressing   Problem: Nutrition: Goal: Adequate nutrition will be maintained Outcome: Progressing   Problem: Coping: Goal: Level of anxiety will decrease Outcome: Progressing   Problem: Elimination: Goal: Will not experience complications related to bowel motility Outcome: Progressing   Problem: Safety: Goal: Ability to remain free from injury will improve Outcome: Progressing   Problem: Skin Integrity: Goal: Risk for impaired skin integrity will decrease Outcome: Progressing

## 2019-04-18 NOTE — Progress Notes (Signed)
Physical Therapy Treatment Patient Details Name: Curtis [name] MRN: [id number] DOB: 14-Mar-1946 Today's Date: 04/18/2019    History of Present Illness Pt is a 73 y.o. male, with history of chronic wounds on bilateral heels and buttocks, pulmonary asbestosis, COPD, peripheral vascular disease, coronary artery disease status post MI in 2015, IBS, hypertension, hyperlipidemia, glaucoma, GERD, diverticulosis, history of AAA and L LE RSD presents to the ED after a fall resulting in L femur fx, underwent IM nail.     PT Comments    Pt admitted for above. He required max assist +2 for bed mobility, but due to pt's subsequent movement capabilities, feel that cognition may have been most limiting factor.  He demonstrated improvement in functional mobility today by completing sit to stand with min assist +2 for safety and stand pivot transfer with mod assist +2 to chair. Pt states he used both a walker and wheelchair at home but unable to confirm how he got into the wheelchair. Formally tested pt's L ankle dorsiflexion strength (3-/5) and he does demonstrate weakness against gravity. Pt on 4L O2 throughout session. Pt left in chair with Busy Board. Pt would benefit from continued skilled acute PT intervention in order to address deficits.   Follow Up Recommendations  SNF     Equipment Recommendations  None recommended by PT    Recommendations for Other Services       Precautions / Restrictions Precautions Precautions: Fall Restrictions Weight Bearing Restrictions: Yes LLE Weight Bearing: Weight bearing as tolerated    Mobility  Bed Mobility Overal bed mobility: Needs Assistance Bed Mobility: Supine to Sit     Supine to sit: +2 for physical assistance;Max assist     General bed mobility comments: max assist +2 for trunk and LE's with use of bed pads; following bed mobility, pt moved quite well, so feel that cognition may have limited bed mobility more than physical movement    Transfers Overall transfer level: Needs assistance Equipment used: Rolling walker (2 wheeled) Transfers: Sit to/from [facility] Sit to Stand: +2 physical assistance;Min assist Stand pivot transfers: Mod assist;+2 physical assistance       General transfer comment: pt able to power up to stand with min assist and SPT blocking L foot due to dorsiflexion weakness; required mod assist +2 for stand pivot transfer as he has trouble advancing L LE; pt required frequent cueing for sequencing and hand placement with both transfers  Ambulation/Gait                 Stairs             Wheelchair Mobility    Modified Rankin (Stroke Patients Only)       Balance Overall balance assessment: Needs assistance Sitting-balance support: Feet supported Sitting balance-Leahy Scale: Fair Sitting balance - Comments: pt able to maintain EOB sitting balance without UE support for ~4 mins   Standing balance support: Bilateral upper extremity supported;During functional activity Standing balance-Leahy Scale: Poor Standing balance comment: requires B UE support on RW                            Cognition Arousal/Alertness: Awake/alert Behavior During Therapy: Flat affect Overall Cognitive Status: No family/caregiver present to determine baseline cognitive functioning Area of Impairment: Attention;Memory;Following commands;Safety/judgement;Awareness;Problem solving                   Current Attention Level: Focused Memory: Decreased short-term memory Following Commands:  Follows one step commands with increased time;Follows one step commands inconsistently Safety/Judgement: Decreased awareness of deficits Awareness: Intellectual Problem Solving: Slow processing;Decreased initiation;Difficulty sequencing;Requires verbal cues;Requires tactile cues        Exercises      General Comments        Pertinent Vitals/Pain Pain Assessment:  Faces Faces Pain Scale: Hurts a little bit Pain Location: L LE Pain Descriptors / Indicators: Grimacing;Guarding;Sore Pain Intervention(s): Limited activity within patient's tolerance;Monitored during session;Repositioned    Home Living                      Prior Function            PT Goals (current goals can now be found in the care plan section) Acute Rehab PT Goals Patient Stated Goal: go home PT Goal Formulation: With patient Time For Goal Achievement: 04/29/19 Potential to Achieve Goals: Fair Progress towards PT goals: Progressing toward goals    Frequency    Min 3X/week      PT Plan Current plan remains appropriate    Co-evaluation              AM-PAC PT "6 Clicks" Mobility   Outcome Measure  Help needed turning from your back to your side while in a flat bed without using bedrails?: Total Help needed moving from lying on your back to sitting on the side of a flat bed without using bedrails?: Total Help needed moving to and from a bed to a chair (including a wheelchair)?: A Lot Help needed standing up from a chair using your arms (e.g., wheelchair or bedside chair)?: A Little Help needed to walk in hospital room?: Total Help needed climbing 3-5 steps with a railing? : Total 6 Click Score: 9    End of Session Equipment Utilized During Treatment: Gait belt;Oxygen(pt on 4L O2 throughout session) Activity Tolerance: Patient tolerated treatment well;Patient limited by fatigue Patient left: in chair;with call bell/phone within reach;with chair alarm set Nurse Communication: Mobility status PT Visit Diagnosis: Other abnormalities of gait and mobility (R26.89);Pain Pain - Right/Left: Left Pain - part of body: Hip     Time: [id number] PT Time Calculation (min) (ACUTE ONLY): 26 min  Charges:  $Therapeutic Exercise: 8-22 mins $Therapeutic Activity: 8-22 mins                     Christel Mormon, SPT   Silvana Newness 04/18/2019, 3:49 PM

## 2019-04-18 NOTE — Care Management Note (Signed)
Case Management Note  Patient Details  Name: Curtis [name] MRN: [id number] Date of Birth: [birth year]  CM left VM for pt's son to return call.  Rae Mar, RN 04/18/2019, 11:43 AM

## 2019-04-18 NOTE — Plan of Care (Signed)

## 2019-04-18 NOTE — Progress Notes (Signed)
PROGRESS NOTE    Curtis [name]  [id number] DOB: 18-Dec-1945 DOA: 04/12/2019 PCP: [name], MD   Brief Narrative:  HPI on 04/13/2019 by Dr. [name] is a 73 y.o. male with medical history significant of AAA, osteoarthritis, asbestosis, CAD, history of NSTEMI, cancer, depression, diverticulosis,/diabetic colitis, GERD, glaucoma, abdominal hernia, hyperlipidemia, hypertension, IBS, peripheral vascular disease, history of pneumonia, reflex sympathetic dystrophy, stage III and IV decubiti ulcers who presented to the ER with left hip pain after a fall.  History is being obtained from the ER record as the patient has altered mental status and is unable to provide a history.  Patient sustained a fall 3 days ago at home.  He was apparently trying to sit down when he missed the chair and fell to the ground.  He normally uses a walker and a wheelchair at home.  As per the family, he had been having severe left hip pain since the fall and was unable to bear any weight on the leg.  The family brought him into the ER today.  Interim history Admitted with left femur fracture, status post surgery.  Now pending SNF placement Assessment & Plan   Left intertrochanteric femur fracture -Status post mechanical fall 3 days prior to admission.  Patient has been unable to bear weight on the left side. -Orthopedic surgery consulted and appreciated, s/p Intramedullary fixation, Left femur.  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  Suspect patient may have some underlying dementia, and should follow-up with a neurologist. -Discussed with patient's son via phone on 04/16/2019, patient son did speak with his PCP, and PCP does feel patient has dementia  Atrial flutter, chronic -The patient has been taking Eliquis at home which is been held for possible surgery -Echocardiogram 01/02/2019 showed an EF of 55 to 60%, no significant valvular disease or systolic dysfunction -Continue metoprolol, digoxin  Coronary artery disease -Status post DES x2 to L circumflex in 2015 -Currently stable, no complaints of chest pain  History of reflux sympathetic dystrophy -Chronic physical deconditioning amatory dysfunction secondary to the above -At baseline, patient is able to ambulate with the use of a walker and uses a wheelchair -PT and OT consulted recommended SNF  BPH -Continue tamsulosin, finasteride  Chronic normocytic anemia -Hemoglobin currently 11, appears to be stable at baseline -Continue to monitor CBC  Depression/anxiety -Continue amitriptyline, Xanax as needed  Chronic decubitus ulcers -Present on admission, patient has chronic ulcerations on bilateral heels (stage II-III) as well as left buttock (unstageable) -Wound care consulted  Questionable chest pain -It seems that overnight, telemetry noted that patient had ST elevation.  Overnight coverage was called and high-sensitivity troponin was obtained.  Troponin peaked at 58, and now trending downward.  Troponins not consistent with ACS.  Patient currently not having chest pain.  DVT Prophylaxis  SCDs  Code Status: Full  Family Communication: None at bedside  Disposition Plan: Admitted.  Dispo to SNF- suspect in 1-2 days-currently pending Covid test  Consultants Orthopedic surgery  Procedures  Intramedullary fixation, Left femur.    Antibiotics   Anti-infectives (From admission, onward)   Start     Dose/Rate Route Frequency Ordered Stop   04/15/19 0600  ceFAZolin (ANCEF) IVPB 2g/100 mL premix  Status:  Discontinued     2 g 200 mL/hr over 30 Minutes Intravenous To Short Stay 04/14/19 1251 04/14/19 1259   04/15/19 0000  vancomycin (VANCOCIN) IVPB 1000 mg/200 mL premix     1,000 mg 200 mL/hr over 60 Minutes Intravenous Every 12 hours 04/14/19 1700 04/15/19 0154   04/14/19 1315  vancomycin (VANCOCIN) IVPB 1000 mg/200 mL premix     1,000 mg 200 mL/hr over 60 Minutes Intravenous  Once 04/14/19 1304 04/14/19 1413   04/14/19 1300  ceFAZolin (ANCEF) IVPB 2g/100 mL premix     2 g 200 mL/hr over 30 Minutes Intravenous To Short Stay 04/14/19 1259 04/14/19 1403   04/14/19 1252  ceFAZolin (ANCEF) 2-4 GM/100ML-% IVPB    Note to Pharmacy: [facility]   : cabinet override      04/14/19 1252 04/14/19 1404      Subjective:   Curtis [name] seen and examined today.  Quite this morning, and does not feel like talking. Objective:   Vitals:   04/17/19 2041 04/17/19 2148 04/18/19 0416 04/18/19 0946  BP: (!) 171/79  (!) 143/70 (!) 168/82  Pulse: 87 77 86 87  Resp:    18  Temp: 98.8 F (37.1 C)  98.4 F (36.9 C) 98.6 F (37 C)  TempSrc: Oral  Oral Oral  SpO2: 99%  98% 100%  Weight:      Height:        Intake/Output Summary (Last 24 hours) at 04/18/2019 1230 Last data filed at 04/18/2019 0900 Gross per 24 hour  Intake 240 ml  Output 1800 ml  Net -1560 ml   Filed Weights   04/12/19 2252 04/14/19 0616  Weight: 82.1 kg 81 kg   Exam  General: Well developed, well nourished, elderly, NAD, appears stated age  HEENT: NCAT, mucous membranes moist.   Cardiovascular: S1 S2 auscultated, irregular  Respiratory: Diminished breath sounds, however clear  Abdomen: Soft, nontender, nondistended, + bowel sounds  Extremities: warm dry without cyanosis clubbing or edema. Left ankle weakness  Neuro: AAOx1 (self),  otherwise nonfocal  Psych: Flat   Data Reviewed: I have personally reviewed following labs and imaging studies  CBC: Recent Labs  Lab 04/13/19 0027 04/13/19 0650 04/14/19 1954 04/15/19 0520 04/16/19 0215 04/17/19 0418  WBC 14.9* 13.5* 11.0* 10.6* 11.9* 9.2  NEUTROABS 12.0*  --   --   --   --   --   HGB 9.7* 9.2* 9.4* 8.8* 10.9* 11.0*  HCT 32.9* 31.7* 31.1* 29.4* 35.0* 35.0*  MCV 94.5 95.5 92.0 92.2 89.5 87.7  PLT 341 331 338 347 438* [id number]   Basic Metabolic Panel: Recent Labs  Lab 04/13/19 0027 04/13/19 0650 04/14/19 1954 04/15/19 0520 04/16/19 0215 04/17/19 0418  NA 136 137  --  137 137 140  K 4.9 4.8  --  4.2 3.1* 3.8  CL 97* 98  --  99 93* 95*  CO2 28 29  --  28 32 30  GLUCOSE 142* 155*  --  131* 92 127*  BUN 39* 40*  --  33* 33* 31*  CREATININE 1.50* 1.31* 1.04 1.05 1.06 0.93  CALCIUM 9.6 9.6  --  9.2 9.6 9.5  MG  --   --   --   --  1.7 2.2  PHOS  --   --   --   --  3.0  --    GFR: Estimated Creatinine Clearance: 76.5 mL/min (by C-G formula based on SCr of 0.93 mg/dL). Liver Function Tests: No results for input(s): AST, ALT, ALKPHOS, BILITOT, PROT, ALBUMIN in the last 168 hours. No results for input(s): LIPASE, AMYLASE in the last 168 hours. No results for input(s): AMMONIA in the last 168 hours. Coagulation Profile: Recent Labs  Lab 04/13/19 0027  INR 1.3*   Cardiac Enzymes: No results for input(s): CKTOTAL, CKMB, CKMBINDEX, TROPONINI in the last 168 hours. BNP (last 3 results) No results for input(s): PROBNP in the last 8760 hours. HbA1C: No results for input(s): HGBA1C in the last 72 hours. CBG: No results for input(s): GLUCAP in the last 168 hours. Lipid Profile: No results for input(s): CHOL, HDL, LDLCALC, TRIG, CHOLHDL, LDLDIRECT in the last 72 hours. Thyroid Function Tests: No results for input(s): TSH, T4TOTAL, FREET4, T3FREE, THYROIDAB in the last 72 hours. Anemia Panel: No results for input(s): VITAMINB12, FOLATE, FERRITIN, TIBC, IRON,  RETICCTPCT in the last 72 hours. Urine analysis:    Component Value Date/Time   COLORURINE YELLOW 04/16/2019 2049   APPEARANCEUR CLEAR 04/16/2019 2049   LABSPEC 1.011 04/16/2019 2049   PHURINE 5.0 04/16/2019 2049   GLUCOSEU NEGATIVE 04/16/2019 2049   [place] NEGATIVE 04/16/2019 2049   [place] NEGATIVE 04/16/2019 2049   KETONESUR 5 (A) 04/16/2019 2049   PROTEINUR NEGATIVE 04/16/2019 2049   UROBILINOGEN 0.2 02/12/2015 1859   NITRITE NEGATIVE 04/16/2019 2049   LEUKOCYTESUR NEGATIVE 04/16/2019 2049   Sepsis Labs: @LABRCNTIP (procalcitonin:4,lacticidven:4)  ) Recent Results (from the past 240 hour(s))  SARS CORONAVIRUS 2 (TAT 6-24 HRS) Nasopharyngeal Nasopharyngeal Swab     Status: None   Collection Time: 04/13/19  2:13 AM   Specimen: Nasopharyngeal Swab  Result Value Ref Range Status   SARS Coronavirus 2 NEGATIVE NEGATIVE Final    Comment: (NOTE) SARS-CoV-2 target nucleic acids are NOT DETECTED. The SARS-CoV-2 RNA is generally detectable in upper and lower respiratory specimens during the acute phase of infection. Negative results do not preclude SARS-CoV-2 infection, do not rule out co-infections with other pathogens, and should not be used as the sole basis for treatment or other patient management decisions. Negative results must be combined with clinical observations, patient history, and epidemiological information. The expected result is Negative. Fact Sheet for Patients: SugarRoll.be Fact Sheet for Healthcare Providers: https://www.woods-mathews.com/ This test is not yet approved or cleared by the [place] FDA and  has been authorized for detection and/or diagnosis of SARS-CoV-2 by FDA under an Emergency Use Authorization (EUA). This EUA will remain  in effect (meaning this test can be used) for the duration of the COVID-19 declaration under Section 56 4(b)(1) of the Act, 21 U.S.C. section 360bbb-3(b)(1), unless the  authorization is terminated or revoked sooner. Performed at [facility], [place] [address], [place], [place] [address]   Surgical pcr screen     Status: Abnormal   Collection Time: 04/14/19 12:06 AM   Specimen: Nasal Mucosa; Nasal Swab  Result Value Ref Range Status   MRSA, PCR POSITIVE (A) NEGATIVE Final   Staphylococcus aureus POSITIVE (A) NEGATIVE Final    Comment: RESULT CALLED TO, READ BACK BY AND VERIFIED WITHRex Kras RN 04/14/19 [id number] (NOTE) The Xpert SA Assay (FDA approved for NASAL specimens in patients 7 years of age and older), is one component of a comprehensive surveillance program. It is not intended to diagnose infection nor to guide or monitor treatment. Performed at [facility],  [address] [place], [place] [address]       Radiology Studies: No results found.   Scheduled Meds:  Chlorhexidine Gluconate Cloth  6 each Topical Daily   collagenase  1 application Topical Daily   digoxin  0.125 mg Oral Daily   docusate sodium  100 mg Oral BID   enoxaparin (LOVENOX) injection  40 mg Subcutaneous Q24H   feeding supplement (PRO-STAT SUGAR FREE 64)  30 mL Oral BID BM   finasteride  5 mg Oral Daily   fluticasone furoate-vilanterol  1 puff Inhalation Daily   latanoprost  1 drop Both Eyes QHS   metoprolol tartrate  25 mg Oral BID   multivitamin with minerals  1 tablet Oral Daily   mupirocin ointment  1 application Nasal BID   spironolactone  25 mg Oral Daily   tamsulosin  0.4 mg Oral QHS   torsemide  20 mg Oral BID BM   Continuous Infusions:  sodium chloride Stopped (04/15/19 1337)     LOS: 5 days   Time Spent in minutes   30 minutes  Briyana Badman D.O. on 04/18/2019 at 12:30 PM  Between 7am to 7pm - Please see pager noted on [email or web address]  After 7pm go to [email or web address]  And look for the night coverage person covering for me after hours  [facility] Office  [phone]

## 2019-04-18 NOTE — Care Management Note (Addendum)
Case Management Note  Patient Details  Name: Curtis [name] MRN: [id number] Date of Birth: 1946/04/14  CM received a return call from Curtis [name].  His choice was [facility] pending bed availability.  He reports the pt had been there in the past and didn't have any complaints.  CM spoke with Lexine Baton at [facility], pt's son's first choice.  They no longer have bed availability until Wed.  CM spoke with Curtis [name] who requested the pt's information be sent to [facility] in [place].  CM contacted Elyse Hsu for review.  Next choice is [facility] and then [facility].  CM will await return call from [facility] before contacting the other facilities.  TOC will continue to follow.  Rae Mar, RN 04/18/2019, 2:48 PM

## 2019-04-18 NOTE — Care Management Important Message (Signed)
Important Message  Patient Details  Name: Curtis [name] MRN: [id number] Date of Birth: 21-Jan-1946   Medicare Important Message Given:  Yes     [name] 04/18/2019, 3:20 PM    IM SIGNED BY PATIENT

## 2019-04-19 MED ORDER — ALPRAZOLAM 1 MG PO TABS: 1.0000 mg | ORAL_TABLET | Freq: Two times a day (BID) | ORAL | 0 refills | Status: AC | PRN

## 2019-04-19 MED ORDER — MULTI-VITAMIN/MINERALS PO TABS: 1.0000 | ORAL_TABLET | Freq: Every day | ORAL | Status: DC

## 2019-04-19 NOTE — TOC Transition Note (Signed)
Transition of Care [facility]) - CM/SW Discharge Note   Patient Details  Name: Curtis [name] MRN: [id number] Date of Birth: 1946-03-13  [facility] CM/SW Contact:  Rae Mar, RN Phone Number: 04/19/2019, 10:27 AM   Clinical Narrative:     Pt was accepted at [facility].  Please call report to [phone].  CM update pt's son.    [facility] will be called when ready for transport.  No further TOC needs noted at this time.  Final next level of care: Skilled Nursing Facility Barriers to Discharge: Barriers Resolved   Patient Goals and CMS Choice     Choice offered to / list presented to : Adult Children  Discharge Placement                       Discharge Plan and Services In-house Referral: Clinical Social Work Discharge Planning Services: CM Consult Post Acute Care Choice: [place]          DME Arranged: N/A DME Agency: NA       HH Arranged: NA [place] Agency: NA        Social Determinants of Health (SDOH) Interventions     Readmission Risk Interventions Readmission Risk Prevention Plan 02/11/2019 01/04/2019 12/19/2018  Transportation Screening Complete Complete Complete  PCP or Specialist Appt within 5-7 Days - - Complete  PCP or Specialist Appt within 3-5 Days - Complete -  Home Care Screening - - Complete  Medication Review (RN CM) - - Complete  HRI or Home Care Consult - Complete -  Social Work Consult for [place] Planning/Counseling - Complete -  Palliative Care Screening - Not Applicable -  Medication Review Press photographer) Complete Complete -  PCP or Specialist appointment within 3-5 days of discharge Not Complete - -  PCP/Specialist Appt Not Complete comments Pt returning to SNF - -  [place] or [place] Not Complete - -  [place] or [place] Pt Refusal Comments Pt returning to SNF - -  SW Recovery Care/Counseling Consult Complete - -  Palliative Care Screening Not Applicable - -  [place] Complete - -  Some recent data might be hidden

## 2019-04-19 NOTE — Progress Notes (Signed)
Pt stable. Tolerated dressing changes well. Sacral foam removed, wound cleansed, new dressing applied. Bilateral heel dressings cleansed and changed. Will continue to monitor pt.

## 2019-04-19 NOTE — Progress Notes (Signed)
Pt stable. Pt voiced no pain throughout the night. Heel dressings changed along with sacral dressing. Will continue to monitor.

## 2019-04-19 NOTE — Progress Notes (Signed)
Discharge:  Attempted to call discharge to [facility], transferred to VM of Levada Dy, x2.  Left message on her VM with my contact information.  Paperwork printed and taken with [facility].

## 2019-04-19 NOTE — Discharge Summary (Addendum)
Physician Discharge Summary  Curtis [name] [id number] DOB: [birth year] DOA: 04/12/2019  PCP: [name], MD  Admit date: 04/12/2019 Discharge date: 04/19/2019  Time spent: 45 minutes  Recommendations for Outpatient Follow-up:  Patient will be discharged to skilled facility, continue physical and occupational therapy.  Patient will need to follow up with primary care provider within one week of discharge.  Follow-up with orthopedic surgery.  Patient should continue medications as prescribed.  Patient should follow a heart healthy diet.   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 Tora Kindred Malloyis a 73 y.o.malewith medical history significant ofAAA, osteoarthritis, asbestosis, CAD, history ofNSTEMI, cancer, depression, diverticulosis,/diabetic colitis, GERD, glaucoma, abdominal hernia, hyperlipidemia, hypertension, IBS, peripheral vascular disease, history of pneumonia, reflex sympathetic dystrophy, stage III and IV decubiti ulcerswho presented to the ER with left hip pain after a fall.History is being obtained from the ER record as the patient has altered mental status and is unable to provide a history. Patient sustained a fall 3 days ago at home. He was apparently trying to sit down when he missed the chair and fell to the ground. He normally uses a walker and a wheelchair at home. As per the family, he had been having severe left hip pain since  the fall and was unable to bear any weight on the leg. The family brought him into the ER today.  Hospital Course:  Left intertrochanteric femur fracture -Status post mechanical fall 3 days prior to admission.  Patient has been unable to bear weight on the left side. -Orthopedic surgery consulted and appreciated, s/p Intramedullary fixation,Leftfemur.  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  Suspect patient may have some underlying dementia, and should follow-up with a neurologist. -Discussed with patient's son via phone on 04/16/2019, patient son did speak with his PCP, and PCP does feel patient has dementia  Atrial flutter, chronic -The patient has been taking Eliquis at home which is been held for possible surgery -Echocardiogram 01/02/2019 showed an EF of 55 to 60%, no significant valvular disease or systolic dysfunction -Continue metoprolol -per son, patient has not been on Eliquis or digoxin in a long time as many of these medications "messed him up". Urged son/patient to follow up with cardiology or PCP to discuss this further  Coronary artery disease -Status post DES x2 to L circumflex in 2015 -Currently stable, no complaints of chest pain -Aspirin -Per son, patient no longer taking Eliquis, Plavix or Brilinta as his medications have "messed him up"  in the past  History of reflux sympathetic dystrophy -Chronic physical deconditioning  amatory dysfunction secondary to the above -At baseline, patient is able to ambulate with the use of a walker and uses a wheelchair -PT and OT consulted recommended SNF  BPH -Continue tamsulosin, finasteride  Chronic normocytic anemia -Hemoglobin currently 11, appears to be stable at baseline -Continue to monitor CBC  Depression/anxiety -Continue amitriptyline, Xanax as needed  Chronic decubitus ulcers -Present on admission, patient has chronic ulcerations on bilateral heels (stage II-III) as well as left buttock (unstageable) -Wound care consulted  Questionable chest pain -It seems that overnight, telemetry noted that patient had ST elevation.  Overnight coverage was called and high-sensitivity troponin was obtained.  Troponin peaked at 58, and now trending downward.  Troponins not consistent with ACS.  Patient currently not having chest pain.  Consultants Orthopedic surgery  Procedures  Intramedullary fixation,Leftfemur.   Discharge Exam: Vitals:   04/19/19 0349 04/19/19 0803  BP: (!) 163/77 (!) 152/72  Pulse: 85 88  Resp: 16 17  Temp: 98.4 F (36.9 C) 97.6 F (36.4 C)  SpO2: 100% 100%     General: Well developed, well nourished, elderly, NAD  HEENT: NCAT, mucous membranes moist.  Cardiovascular: S1 S2 auscultated, irregular  Respiratory: Diminished breath sounds however clear  Abdomen: Soft, nontender, nondistended, + bowel sounds  Extremities: warm dry without cyanosis clubbing or edema.  Left ankle weakness  Neuro: AAOx3, nofocal  Psych: Pleasant, appropriate mood and affect  Discharge Instructions Discharge Instructions    Discharge instructions   Complete by: As directed    Patient will be discharged to skilled facility, continue physical and occupational therapy.  Patient will need to follow up with primary care provider within one week of discharge.   Follow-up with orthopedic surgery.  Patient should continue medications as prescribed.  Patient should follow a heart healthy diet.     Allergies as of 04/19/2019      Reactions   Codeine Itching   Trileptal [oxcarbazepine]    Other reaction(s): Other (See Comments) Abnormal sodium levels Hyponatremia      Medication List    STOP taking these medications   apixaban 5 MG Tabs tablet Commonly known as: Eliquis   digoxin 0.125 MG tablet Commonly known as: LANOXIN   ondansetron 4 MG tablet Commonly known as: ZOFRAN   spironolactone 25 MG tablet Commonly known as: ALDACTONE   torsemide 20 MG tablet Commonly known as: DEMADEX     TAKE these medications   acetaminophen 650 MG CR tablet Commonly known as: TYLENOL Take 650 mg by mouth 3 (three) times daily.   ALPRAZolam 1 MG tablet Commonly known as: XANAX Take 1 tablet (1 mg total) by mouth 2 (two) times daily as needed for anxiety or sleep. What changed:   medication strength  how much to take  when to take this  reasons to take this   amitriptyline 100 MG tablet Commonly known as: ELAVIL Take 1 tablet (100 mg total) by mouth at bedtime.   amLODipine 2.5 MG tablet Commonly known as: NORVASC Take 2.5 mg by mouth daily.   Anti-Itch lotion Generic drug: camphor-menthol Apply 1 application topically 2 (two) times daily as needed for itching. Sparingly   aspirin EC 81 MG tablet Take 81 mg by mouth daily.   bisacodyl 5 MG EC tablet Commonly known as: DULCOLAX Take 5 mg by mouth daily as needed for mild constipation or moderate constipation.   budesonide-formoterol 160-4.5 MCG/ACT inhaler Commonly known as: SYMBICORT Inhale 2 puffs into the lungs 2 (two) times daily.   clotrimazole  1 % cream Commonly known as: LOTRIMIN Instructions: apply to buttock rash three times daily Three Times A Day 09:00 AM, 02:00 PM, 09:00 PM   collagenase ointment Commonly known as: SANTYL Instructions: Apply to left heel  per treatment orders.   Santyl ointment Generic drug: collagenase Apply 1 application topically daily. Apply to left buttock wound and bilateral heel ulcerations.   [employer] Digestive Probiotic 250 MG capsule Generic drug: saccharomyces boulardii Take 250 mg by mouth daily.   docusate sodium 100 MG capsule Commonly known as: COLACE Take 100 mg by mouth daily as needed for mild constipation.   enoxaparin 40 MG/0.4ML injection Commonly known as: LOVENOX Inject 0.4 mLs (40 mg total) into the skin daily.   feeding supplement (PRO-STAT SUGAR FREE 64) Liqd Take 30 mLs by mouth 2 (two) times daily between meals.   finasteride 5 MG tablet Commonly known as: PROSCAR Take 1 tablet (5 mg total) by mouth every morning.   gabapentin 800 MG tablet Commonly known as: NEURONTIN Take 1 tablet (800 mg total) by mouth 3 (three) times daily.   guaiFENesin 600 MG 12 hr tablet Commonly known as: MUCINEX Take 600 mg by mouth 2 (two) times daily.   latanoprost 0.005 % ophthalmic solution Commonly known as: XALATAN Place 1 drop into both eyes at bedtime.   metoprolol tartrate 25 MG tablet Commonly known as: LOPRESSOR Take 2 tablets (50 mg total) by mouth 2 (two) times daily. What changed: how much to take   multivitamin with minerals tablet Take 1 tablet by mouth daily.   nitroGLYCERIN 0.4 MG SL tablet Commonly known as: NITROSTAT Place 1 tablet (0.4 mg total) under the tongue every 5 (five) minutes x 3 doses as needed for chest pain.   OXYGEN Place 4 L/min into the nose continuous.   tamsulosin 0.4 MG Caps capsule Commonly known as: FLOMAX Take 1 capsule (0.4 mg total) by mouth at bedtime.   Venelex Oint Apply 1 application topically See admin instructions. Apply to coccyx, sacrum, bilateral buttocks each shift and prn      Allergies  Allergen Reactions   Codeine Itching   Trileptal [Oxcarbazepine]     Other reaction(s): Other (See Comments) Abnormal sodium levels Hyponatremia    Follow-up Information    Swinteck, Aaron Edelman, MD. Schedule an appointment as soon as possible for a visit in 2 weeks.   Specialty: Orthopedic Surgery Why: For wound re-check Contact information: [address] [place] [address] [id number]        [name], MD. Schedule an appointment as soon as possible for a visit in 1 week(s).   Specialty: Family Medicine Why: Hospital follow up Contact information: [address] [place] [address] [phone]        Herminio Commons, MD .   Specialty: Cardiology Contact information: [place]  [phone]            The results of significant diagnostics from this hospitalization (including imaging, microbiology, ancillary and laboratory) are listed below for reference.    Significant Diagnostic Studies: Pelvis Portable  Result Date: 04/14/2019 CLINICAL DATA:  Status post ORIF of left hip fracture EXAM: PORTABLE PELVIS 1-2 VIEWS COMPARISON:  04/13/19, intraoperative films from earlier in the same day. FINDINGS: Proximal medullary rod is noted in the left femur with fixation screws both proximally and distally. Fracture fragments are in anatomic alignment. No other focal abnormality is seen. IMPRESSION: Status post ORIF of left femoral fracture. Electronically  Signed   By: Inez Catalina M.D.   On: 04/14/2019 15:59   DG Chest Port 1 View  Result Date: 04/13/2019 CLINICAL DATA:  Shortness of breath. EXAM: PORTABLE CHEST 1 VIEW COMPARISON:  Radiograph 02/15/2019. Chest CTA 02/11/2019 FINDINGS: Cardiomegaly with unchanged mediastinal contours. Atherosclerosis of the aortic arch. Bilateral calcified pleural plaques and pleural thickening/small effusions, chronic and not significantly changed. No evidence of acute airspace disease, evaluation slightly limited due to underlying pleural disease. No pneumothorax. No acute osseous abnormalities. IMPRESSION: 1. Stable cardiomegaly. 2.  Stable calcified pleural plaques and pleural chronic effusions. 3. No evidence of acute findings, evaluation slightly limited due to underlying pleural disease. Aortic Atherosclerosis (ICD10-I70.0). Electronically Signed   By: Keith Rake M.D.   On: 04/13/2019 00:40   DG Knee Left Port  Result Date: 04/13/2019 CLINICAL DATA:  Fall, known left hip fracture EXAM: PORTABLE LEFT KNEE - 1-2 VIEW COMPARISON:  None. FINDINGS: No fracture or dislocation is seen. Mild tricompartmental degenerative changes. Visualized soft tissues are within normal limits. Vascular calcifications. No suprapatellar knee joint effusion. IMPRESSION: Negative. Electronically Signed   By: Julian Hy M.D.   On: 04/13/2019 08:22   DG C-Arm 1-60 Min  Result Date: 04/14/2019 CLINICAL DATA:  ORIF hip fracture EXAM: OPERATIVE left HIP (WITH PELVIS IF PERFORMED) 2 VIEWS TECHNIQUE: Fluoroscopic spot image(s) were submitted for interpretation post-operatively. COMPARISON:  04/13/2019 FINDINGS: Gamma nail fixation of an intertrochanteric fracture. Components appear well positioned. Alignment is anatomic. Distal locking screw utilized. IMPRESSION: Good appearance following ORIF for intertrochanteric fracture. Electronically Signed   By: Nelson Chimes M.D.   On: 04/14/2019 15:35   DG HIP OPERATIVE UNILAT WITH PELVIS LEFT  Result Date: 04/14/2019 CLINICAL DATA:  ORIF hip fracture EXAM: OPERATIVE left HIP (WITH PELVIS IF PERFORMED) 2 VIEWS TECHNIQUE: Fluoroscopic spot image(s) were submitted for interpretation post-operatively. COMPARISON:  04/13/2019 FINDINGS: Gamma nail fixation of an intertrochanteric fracture. Components appear well positioned. Alignment is anatomic. Distal locking screw utilized. IMPRESSION: Good appearance following ORIF for intertrochanteric fracture. Electronically Signed   By: Nelson Chimes M.D.   On: 04/14/2019 15:35   DG HIP UNILAT WITH PELVIS 2-3 VIEWS LEFT  Result Date: 04/13/2019 CLINICAL DATA:  Pain  EXAM: DG HIP (WITH OR WITHOUT PELVIS) 2-3V LEFT COMPARISON:  None. FINDINGS: There is an acute mildly displaced intratrochanteric fracture of the proximal left femur. There is no dislocation. There is osteopenia. Vascular calcifications are noted. Phleboliths project over the patient's pelvis. IMPRESSION: Acute mildly displaced intratrochanteric fracture of the proximal left femur. Electronically Signed   By: Constance Holster M.D.   On: 04/13/2019 00:39    Microbiology: Recent Results (from the past 240 hour(s))  SARS CORONAVIRUS 2 (TAT 6-24 HRS) Nasopharyngeal Nasopharyngeal Swab     Status: None   Collection Time: 04/13/19  2:13 AM   Specimen: Nasopharyngeal Swab  Result Value Ref Range Status   SARS Coronavirus 2 NEGATIVE NEGATIVE Final    Comment: (NOTE) SARS-CoV-2 target nucleic acids are NOT DETECTED. The SARS-CoV-2 RNA is generally detectable in upper and lower respiratory specimens during the acute phase of infection. Negative results do not preclude SARS-CoV-2 infection, do not rule out co-infections with other pathogens, and should not be used as the sole basis for treatment or other patient management decisions. Negative results must be combined with clinical observations, patient history, and epidemiological information. The expected result is Negative. Fact Sheet for Patients: SugarRoll.be Fact Sheet for Healthcare Providers: https://www.woods-mathews.com/ This test is not yet approved or cleared  by the [place] and  has been authorized for detection and/or diagnosis of SARS-CoV-2 by FDA under an Emergency Use Authorization (EUA). This EUA will remain  in effect (meaning this test can be used) for the duration of the COVID-19 declaration under Section 56 4(b)(1) of the Act, 21 U.S.C. section 360bbb-3(b)(1), unless the authorization is terminated or revoked sooner. Performed at [facility], [place] [address],  [place], [place] [address]   Surgical pcr screen     Status: Abnormal   Collection Time: 04/14/19 12:06 AM   Specimen: Nasal Mucosa; Nasal Swab  Result Value Ref Range Status   MRSA, PCR POSITIVE (A) NEGATIVE Final   Staphylococcus aureus POSITIVE (A) NEGATIVE Final    Comment: RESULT CALLED TO, READ BACK BY AND VERIFIED WITHRex Kras RN 04/14/19 [id number] (NOTE) The Xpert SA Assay (FDA approved for NASAL specimens in patients 76 years of age and older), is one component of a comprehensive surveillance program. It is not intended to diagnose infection nor to guide or monitor treatment. Performed at [facility], [place] [address] [place], [place] [address]   SARS CORONAVIRUS 2 (TAT 6-24 HRS) Nasopharyngeal Nasopharyngeal Swab     Status: None   Collection Time: 04/18/19  2:50 PM   Specimen: Nasopharyngeal Swab  Result Value Ref Range Status   SARS Coronavirus 2 NEGATIVE NEGATIVE Final    Comment: (NOTE) SARS-CoV-2 target nucleic acids are NOT DETECTED. The SARS-CoV-2 RNA is generally detectable in upper and lower respiratory specimens during the acute phase of infection. Negative results do not preclude SARS-CoV-2 infection, do not rule out co-infections with other pathogens, and should not be used as the sole basis for treatment or other patient management decisions. Negative results must be combined with clinical observations, patient history, and epidemiological information. The expected result is Negative. Fact Sheet for Patients: SugarRoll.be Fact Sheet for Healthcare Providers: https://www.woods-mathews.com/ This test is not yet approved or cleared by the [place] FDA and  has been authorized for detection and/or diagnosis of SARS-CoV-2 by FDA under an Emergency Use Authorization (EUA). This EUA will remain  in effect (meaning this test can be used) for the duration of the COVID-19 declaration under Section 56 4(b)(1) of  the Act, 21 U.S.C. section 360bbb-3(b)(1), unless the authorization is terminated or revoked sooner. Performed at [facility], [place] [address], [place], [place] [address]      Labs: Basic Metabolic Panel: Recent Labs  Lab 04/13/19 0027 04/13/19 0650 04/14/19 1954 04/15/19 0520 04/16/19 0215 04/17/19 0418  NA 136 137  --  137 137 140  K 4.9 4.8  --  4.2 3.1* 3.8  CL 97* 98  --  99 93* 95*  CO2 28 29  --  28 32 30  GLUCOSE 142* 155*  --  131* 92 127*  BUN 39* 40*  --  33* 33* 31*  CREATININE 1.50* 1.31* 1.04 1.05 1.06 0.93  CALCIUM 9.6 9.6  --  9.2 9.6 9.5  MG  --   --   --   --  1.7 2.2  PHOS  --   --   --   --  3.0  --    Liver Function Tests: No results for input(s): AST, ALT, ALKPHOS, BILITOT, PROT, ALBUMIN in the last 168 hours. No results for input(s): LIPASE, AMYLASE in the last 168 hours. No results for input(s): AMMONIA in the last 168 hours. CBC: Recent Labs  Lab 04/13/19 0027 04/13/19  [id number] 04/14/19 1954 04/15/19 0520 04/16/19 0215 04/17/19 0418  WBC 14.9* 13.5* 11.0* 10.6* 11.9* 9.2  NEUTROABS 12.0*  --   --   --   --   --   HGB 9.7* 9.2* 9.4* 8.8* 10.9* 11.0*  HCT 32.9* 31.7* 31.1* 29.4* 35.0* 35.0*  MCV 94.5 95.5 92.0 92.2 89.5 87.7  PLT 341 331 338 347 438* 395   Cardiac Enzymes: No results for input(s): CKTOTAL, CKMB, CKMBINDEX, TROPONINI in the last 168 hours. BNP: BNP (last 3 results) Recent Labs    01/01/19 2327 04/13/19 0027  BNP 885.0* 755.0*    ProBNP (last 3 results) No results for input(s): PROBNP in the last 8760 hours.  CBG: No results for input(s): GLUCAP in the last 168 hours.     Signed:  Cristal Ford  [facility] 04/19/2019, 10:27 AM

## 2019-04-27 ENCOUNTER — Telehealth (INDEPENDENT_AMBULATORY_CARE_PROVIDER_SITE_OTHER): Payer: Medicare Other | Admitting: Internal Medicine

## 2019-04-27 ENCOUNTER — Encounter: Payer: Self-pay | Admitting: Internal Medicine

## 2019-04-27 DIAGNOSIS — I10 Essential (primary) hypertension: Secondary | ICD-10-CM

## 2019-04-27 DIAGNOSIS — E785 Hyperlipidemia, unspecified: Secondary | ICD-10-CM

## 2019-04-27 DIAGNOSIS — Z9981 Dependence on supplemental oxygen: Secondary | ICD-10-CM

## 2019-04-27 DIAGNOSIS — I4892 Unspecified atrial flutter: Secondary | ICD-10-CM

## 2019-04-27 DIAGNOSIS — Z7189 Other specified counseling: Secondary | ICD-10-CM

## 2019-04-27 DIAGNOSIS — I2510 Atherosclerotic heart disease of native coronary artery without angina pectoris: Secondary | ICD-10-CM

## 2019-04-27 DIAGNOSIS — Z7982 Long term (current) use of aspirin: Secondary | ICD-10-CM

## 2019-04-27 DIAGNOSIS — Z955 Presence of coronary angioplasty implant and graft: Secondary | ICD-10-CM

## 2019-04-27 NOTE — Progress Notes (Signed)
Date:  04/27/2019   ID:  Curtis [name], DOB 06-04-1945, MRN [id number]  Patient Location: Home Provider Location: Office  PCP:  [name], MD  Cardiologist:  Kate Sable, MD  Electrophysiologist:  None   Evaluation Performed:  Follow-Up Visit  Chief Complaint:  Patient presents for f/u of atrial flutter and CAD   History of Present Illness:    Curtis [name] is a 73 y.o. male with history of CAD, carotid artery disease, PAD, COPD, asbestosis, hypertension, AVNRT (status post ablation).  Cardiac catheterization in 2015 patient had 30% LAD lesion, 60% D2 lesion 80 and 90% OM lesions 100% RCA.  Showed an occluded RCA and high degree disease in the left circumflex.  The patient is status post PCI/DES x2 to the circumflex.  Myoview in 2015 was low risk for ischemia.  Echocardiogram in September 2015 showed an LVEF of 55 to 60%.  Mild AI moderate MR.  He was last seen in cardiology back in 2017.   The patient was recently hospitalized with a hip fracture.  He underwent surgical repair.  Note that he was in atrial flutter at the time.  He had been on Eliquis at some point in the past for this by report per the record the son told the hospitalist that "it messed him up".  He was not sent home on anticoagulation.  Talking to the patient he is not very active.  Breathing he says is okay.  He denies chest pain, palpitations, dizziness.  No lower extremity edema.  He does not know his medicines he said on taking what they sent me home on. The patient does not have symptoms concerning for COVID-19 infection (fever, chills, cough, or new shortness of breath).    Past Medical History:  Diagnosis Date  . AAA (abdominal aortic aneurysm), stable 04/20/2014  . Arthritis   . 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 CAD (coronary artery disease)    a. s/p cath in 2015 showing occluded RCA and high-grade LCx stenosis treated with DESx2 to LCx  . Cancer ([place])   . Depression   . Diverticulitis    hospital  2011 [facility]  . Diverticulosis   . GERD (gastroesophageal reflux disease)   . 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 Hernia of unspecified site of abdominal cavity without mention of obstruction or gangrene    hiatal  . Hyperlipidemia LDL goal <70 04/20/2014  . Hypertension   . IBS (irritable bowel syndrome)   . Myocardial infarction ([place]) 01/2014   NSTEMI  . Peripheral vascular disease (H[facility])    ankle brachial index of 0.79 on the right and 0.61 on the left.   . Pneumonia   . 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 Reflex sympathetic dystrophy   . 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 SOB (shortness of breath)    Past Surgical History:  Procedure Laterality Date  . CARDIAC CATHETERIZATION    . CATARACT EXTRACTION    . CORONARY ANGIOPLASTY    . HERNIA REPAIR    . INTRAMEDULLARY (IM) NAIL INTERTROCHANTERIC Left 04/14/2019   Procedure: INTRAMEDULLARY (IM) NAIL INTERTROCHANTRIC;  Surgeon: Rod Can, MD;  Location: [place];  Service: Orthopedics;  Laterality: Left;  . LEFT HEART CATHETERIZATION WITH CORONARY ANGIOGRAM N/A 01/17/2014   Procedure: LEFT HEART CATHETERIZATION WITH CORONARY ANGIOGRAM;  Surgeon: Leonie Man, MD;  Location: [facility] CATH LAB;  Service: Cardiovascular;  Laterality: N/A;  . left shoulder      x 3  .  NISSEN FUNDOPLICATION    . right elbow surgery     x 2  . right knee arthroscopy    . squamous cell skin cancer     Left Hand  . SUPRAVENTRICULAR TACHYCARDIA ABLATION N/A 01/16/2014   Procedure: SUPRAVENTRICULAR TACHYCARDIA ABLATION;  Surgeon: Evans Lance, MD;  Location: [facility] C[facility] LAB;  Service: Cardiovascular;  Laterality: N/A;     Current Meds  Medication Sig  . acetaminophen (TYLENOL) 650 MG CR tablet Take 650 mg by mouth 3 (three) times daily.  [name] ALPRAZolam (XANAX) 1 MG tablet Take 1 tablet (1 mg total) by mouth 2 (two) times daily as needed for anxiety or sleep.  . Amino Acids-Protein Hydrolys (FEEDING SUPPLEMENT, PRO-STAT SUGAR FREE 64,) LIQD Take 30 mLs  by mouth 2 (two) times daily between meals.   [name] amitriptyline (ELAVIL) 100 MG tablet Take 1 tablet (100 mg total) by mouth at bedtime.  [name] amLODipine (NORVASC) 2.5 MG tablet Take 2.5 mg by mouth daily.  [name] aspirin EC 81 MG tablet Take 81 mg by mouth daily.  . bisacodyl (DULCOLAX) 5 MG EC tablet Take 5 mg by mouth daily as needed for mild constipation or moderate constipation.  . camphor-menthol (ANTI-ITCH) lotion Apply 1 application topically 2 (two) times daily as needed for itching. Sparingly  . collagenase (SANTYL) ointment Instructions: Apply to left heel per treatment orders.  . collagenase (SANTYL) ointment Apply 1 application topically daily. Apply to left buttock wound and bilateral heel ulcerations.  . [employer] DIGESTIVE PROBIOTIC 250 MG capsule Take 250 mg by mouth daily.  [name] docusate sodium (COLACE) 100 MG capsule Take 100 mg by mouth daily as needed for mild constipation.  . enoxaparin (LOVENOX) 40 MG/0.4ML injection Inject 0.4 mLs (40 mg total) into the skin daily.  . finasteride (PROSCAR) 5 MG tablet Take 1 tablet (5 mg total) by mouth every morning.  . gabapentin (NEURONTIN) 800 MG tablet Take 1 tablet (800 mg total) by mouth 3 (three) times daily.  [name] guaiFENesin (MUCINEX) 600 MG 12 hr tablet Take 600 mg by mouth 2 (two) times daily.   [name] latanoprost (XALATAN) 0.005 % ophthalmic solution Place 1 drop into both eyes at bedtime.  . metoprolol tartrate (LOPRESSOR) 25 MG tablet Take 2 tablets (50 mg total) by mouth 2 (two) times daily. (Patient taking differently: Take 12.5 mg by mouth 2 (two) times daily. )  . Multiple Vitamins-Minerals (MULTIVITAMIN WITH MINERALS) tablet Take 1 tablet by mouth daily.  . nitroGLYCERIN (NITROSTAT) 0.4 MG SL tablet Place 1 tablet (0.4 mg total) under the tongue every 5 (five) minutes x 3 doses as needed for chest pain.  . OXYGEN Place 4 L/min into the nose continuous.   . simvastatin (ZOCOR) 20 MG tablet Take 20 mg by mouth daily.  . tamsulosin (FLOMAX) 0.4 MG  CAPS capsule Take 1 capsule (0.4 mg total) by mouth at bedtime.     Allergies:   Codeine and Trileptal [oxcarbazepine]   Social History   Tobacco Use  . Smoking status: Former Smoker    Packs/day: 2.50    Years: 50.00    Pack years: 125.00    Types: Cigarettes    Quit date: 05/05/2005    Years since quitting: 13.9  . Smokeless tobacco: Former Systems developer    Quit date: 05/03/2012  Substance Use Topics  . Alcohol use: No    Alcohol/week: 0.0 standard drinks    Comment: last use 2013  . Drug use: No     Family Hx:  The patient's family history includes Colon cancer in his maternal aunt, maternal uncle, and mother. There is no history of Heart attack or Stroke.  ROS:   Please see the history of present illness.    All other systems reviewed and are negative.   Prior CV studies:   As noted above.  Labs/Other Tests and Data Reviewed:    EKG:  = EKG not done today.  On 04/16/2019 patient had atrial flutter with 4-1 conduction.  Right bundle branch block  Recent Labs: 02/09/2019: TSH 2.140 02/14/2019: ALT 134 04/13/2019: B Natriuretic Peptide 755.0 04/17/2019: BUN 31; Creatinine, Ser 0.93; Hemoglobin 11.0; Magnesium 2.2; Platelets 395; Potassium 3.8; Sodium 140   Recent Lipid Panel Lab Results  Component Value Date/Time   CHOL 102 04/18/2014 03:06 AM   TRIG 222 (H) 04/18/2014 03:06 AM   HDL 24 (L) 04/18/2014 03:06 AM   CHOLHDL 4.3 04/18/2014 03:06 AM   LDLCALC 34 04/18/2014 03:06 AM   LDLDIRECT 43.7 04/03/2014 10:20 AM    Wt Readings from Last 3 Encounters:  04/27/19 165 lb 11.2 oz (75.2 kg)  04/14/19 178 lb 9.2 oz (81 kg)  03/28/19 182 lb 9.6 oz (82.8 kg)     Objective:    Vital Signs:  BP 130/60   Pulse 88   Wt 165 lb 11.2 oz (75.2 kg) Comment: 04/23/19  BMI 22.47 kg/m    VITAL SIGNS:  reviewed  ASSESSMENT & PLAN:    1.  Atrial flutter.  Rate seems to be controlled in the hospital.  It is unclear to me why he was taken off of the blood thinner.  Will need to  get the records from Dr. Dois Davenport office.  I told the patient we would contact him  2 history of CAD.  Patient denies any anginal symptoms.  Watch.  3 dyslipidemia.  Will need to verify lipid panel.  I told the patient I be in touch with him after I reviewed the findings from Dr. Dois Davenport office.  No other testing for now.  COVID-19 Education: The signs and symptoms of COVID-19 were discussed with the patient and how to seek care for testing (follow up with PCP or arrange E-visit).  The importance of social distancing was discussed today.  Time:   Today, I have spent 20 minutes with the patient with telehealth technology discussing the above problems.     Medication Adjustments/Labs and Tests Ordered: Current medicines are reviewed at length with the patient today.  Concerns regarding medicines are outlined above.   Tests Ordered: No orders of the defined types were placed in this encounter.   Medication Changes: No orders of the defined types were placed in this encounter.   Follow Up:  In Person 3 months  Signed, Dorris Carnes, MD  04/27/2019 10:47 AM    [place]

## 2019-04-28 ENCOUNTER — Encounter: Payer: Self-pay | Admitting: *Deleted

## 2019-04-28 ENCOUNTER — Ambulatory Visit: Payer: Medicare Other | Admitting: Cardiovascular Disease

## 2019-04-28 NOTE — Patient Instructions (Signed)
Medication Instructions:  Your physician recommends that you continue on your current medications as directed. Please refer to the Current Medication list given to you today.  *If you need a refill on your cardiac medications before your next appointment, please call your pharmacy*  Lab Work: NONE  If you have labs (blood work) drawn today and your tests are completely normal, you will receive your results only by: [name] MyChart Message (if you have MyChart) OR . A paper copy in the mail If you have any lab test that is abnormal or we need to change your treatment, we will call you to review the results.  Testing/Procedures: NONE   Follow-Up: At [facility], you and your health needs are our priority.  As part of our continuing mission to provide you with exceptional heart care, we have created designated Provider Care Teams.  These Care Teams include your primary Cardiologist (physician) and Advanced Practice Providers (APPs -  Physician Assistants and Nurse Practitioners) who all work together to provide you with the care you need, when you need it.  Your next appointment:   3 month(s)  The format for your next appointment:   In Person  Provider:   Dorris Carnes, MD  Other Instructions Thank you for choosing [facility]!

## 2019-05-04 ENCOUNTER — Telehealth: Payer: Self-pay | Admitting: General Surgery

## 2019-05-04 NOTE — Telephone Encounter (Signed)
LVM for [facility] (sp?) at [phone] requesting notes for this patient consult appointment. Advised in the voice mail we have not received any doctors notes, labs, tests and would like notes for review to be sent to the attention of Dr. [name]. Fax and call back number provided in detailed vmail.

## 2019-05-05 ENCOUNTER — Other Ambulatory Visit: Payer: Self-pay

## 2019-05-05 ENCOUNTER — Ambulatory Visit (INDEPENDENT_AMBULATORY_CARE_PROVIDER_SITE_OTHER): Payer: Medicare Other | Admitting: Pulmonary Disease

## 2019-05-05 ENCOUNTER — Telehealth: Payer: Self-pay | Admitting: Pulmonary Disease

## 2019-05-05 ENCOUNTER — Encounter: Payer: Self-pay | Admitting: Pulmonary Disease

## 2019-05-05 VITALS — BP 128/68 | HR 97 | Temp 98.3°F | Ht 72.0 in | Wt 173.0 lb

## 2019-05-05 DIAGNOSIS — J920 Pleural plaque with presence of asbestos: Secondary | ICD-10-CM | POA: Diagnosis not present

## 2019-05-05 DIAGNOSIS — J418 Mixed simple and mucopurulent chronic bronchitis: Secondary | ICD-10-CM

## 2019-05-05 MED ORDER — PREDNISONE 20 MG PO TABS: ORAL_TABLET | ORAL | 0 refills | Status: DC

## 2019-05-05 MED ORDER — TRELEGY ELLIPTA 200-62.5-25 MCG/INH IN AEPB: 1.0000 | INHALATION_SPRAY | Freq: Every day | RESPIRATORY_TRACT | 0 refills | Status: AC

## 2019-05-05 MED ORDER — ALBUTEROL SULFATE HFA 108 (90 BASE) MCG/ACT IN AERS: INHALATION_SPRAY | RESPIRATORY_TRACT | 3 refills | Status: AC

## 2019-05-05 NOTE — Progress Notes (Deleted)
  Subjective:     Patient ID: Curtis [name], male   DOB: [birth year], 73 y.o.   MRN: [id number]  HPI   Review of Systems  Constitutional: Positive for appetite change, chills and fatigue.  HENT: Positive for hearing loss, sinus pressure, sinus pain, sore throat and trouble swallowing.   Eyes: Positive for visual disturbance.  Respiratory: Positive for cough, chest tightness and shortness of breath.   Cardiovascular: Positive for chest pain.       Heart attack in 2015  Gastrointestinal: Positive for abdominal pain, constipation and nausea.  Endocrine: Negative.   Genitourinary: Positive for difficulty urinating.  Musculoskeletal: Positive for back pain, neck pain and neck stiffness.  Skin: Positive for wound.  Allergic/Immunologic: Negative.   Neurological: Positive for weakness, numbness and headaches.  Hematological: Negative.   Psychiatric/Behavioral: Positive for agitation, confusion and decreased concentration. The patient is nervous/anxious and is hyperactive.        Objective:   Physical Exam     Assessment:     ***    Plan:     ***

## 2019-05-05 NOTE — Telephone Encounter (Signed)
I called back Levada Dy with [facility]. I advised her of what happened when the patient was seen here in our office.   I advised her the patient was in a wheelchair and when I asked if he could stand so his weight could be taken, I was never told he had a wound on the back of his foot (heel area) or that it was recently wrapped by wound care (his nurse only said he had difficulty standing, did not say why).  The patient was still seated, and when I was positioning the wheelchair to be in front of the scale, the patient had moved his foot which was already resting on the foot pedal. He said "oh" and I stopped the wheelchair. The patient hit is foot when he moved it on the foot pedal/foot board of the wheelchair. When I looked there was blood on the floor. I told Levada Dy that the nurse who was with the patient did not say anything nor did she ever direct for the wound to be redressed (I had to get paper towels from the bathroom to put on the floor to soak up the blood that bled out from his sock).  I told her that once I got the patient into the room I worked him up to be seen by Dr. [name]. However, I told both the patient and the nurse with him that I would see what I could find to help his wound, and hoped we had something as we do not perform wound care. I did put paper towels under his foot while it was in the wheelchair to keep the blood from getting on the floor.   I told Levada Dy once the doctor left the room I went back in and had to ask his nurse to help hold his leg and foot (more than once, and gave her gloves to put on) so that I could get the sock down and place gauze and wrap it as best as possible once I saw how his wound was (this patient had an already existing wound that appeared to look as if part of his upper heel which was bandaged had slid upward and came off. The original dressing clearly had moved up based on how the patient hit his foot on the foot pedal/board.   I advised Levada Dy  that I placed the gauze and wrapped it as best as possible, but because I did want to try to put the sock back up over the area I used the biohazard bag to keep over the foot so that if he bled again he would not bleed in the vehicle used to transport him'  I told Levada Dy what I told her was not to get the nurse who came with the patient in trouble, but she did nothing. She did not direct for wound care, she had to be asked to help hold his leg and foot so I could tend to the wound. Levada Dy was silent the entire time and then said, "what do you want me to do". I told her I was only telling her what happened based on the call that our office received. Levada Dy stated "well we have two stories and then there is the truth". I told Levada Dy what I stated to her was what happened and I was not trying to get the nurse who was with him in trouble. I am only telling her what occurred. At this Levada Dy said "fine" and hung up the phone.

## 2019-05-05 NOTE — Patient Instructions (Addendum)
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 This is your EVERYDAY inhaler. START Albuterol every 4-6 hours as needed. This is your RESCUE inhaler. START Prednisone 40 mg for five days for presumed COPD exacerbation.

## 2019-05-05 NOTE — Progress Notes (Signed)
Subjective:   PATIENT ID: [name] GENDER: male DOB: October 13, 1945, MRN: [id number]   HPI  Chief Complaint  Patient presents with  . Consult    COPD, Asbestosis exposure    Reason for Visit: New consult for pleural plaques  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  His currently residing at a SNF. He reports left hip injury 2-3 months ago and starting oxygen around the same time. He has a daily chest congestion with productive cough with minimum clear/white sputum. He has inspiratory and expiratory wheezing with rest and exertion. He feels short-winded when ambulating <10 feet. Nothing improves his symptoms. He reports social history as noted below that he is aware puts him at risk for asbestosis exposure.  Social History: Quit in 2007.  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  Past Medical History:  Diagnosis Date  . AAA (abdominal aortic aneurysm), stable 04/20/2014  . Arthritis   . 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 CAD (coronary artery disease)    a. s/p cath in 2015 showing occluded RCA and high-grade LCx stenosis treated with DESx2 to LCx  . Cancer ([place])   . Depression   . Diverticulitis    hospital 2011 [facility]  . Diverticulosis   . GERD (gastroesophageal reflux disease)   . 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 Hernia of unspecified site of abdominal cavity without mention of obstruction or gangrene    hiatal  . Hyperlipidemia LDL goal <70 04/20/2014  . Hypertension   . IBS (irritable bowel syndrome)   . Myocardial infarction ([place]) 01/2014   NSTEMI  . Peripheral vascular disease ([facility])    ankle brachial index of 0.79 on the right and 0.61 on the left.   . Pneumonia   .  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 Reflex sympathetic dystrophy   . 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 SOB (shortness of breath)      Family History  Problem Relation Age of Onset  . Colon cancer Mother   . Colon cancer Maternal Aunt   . Colon cancer Maternal Uncle   . Heart attack Neg Hx   . Stroke Neg Hx      Social History   Occupational History  . Occupation: [employer]  . Occupation: retired   Tobacco Use  . Smoking status: Former Smoker    Packs/day: 2.50    Years: 50.00    Pack years: 125.00    Types: Cigarettes    Quit date: 05/05/2005    Years since quitting: 14.0  . Smokeless tobacco: Former Systems developer    Quit date: 05/03/2012  Substance and Sexual Activity  . Alcohol use: No    Alcohol/week: 0.0 standard drinks    Comment: last use 2013  . Drug use: No  . Sexual activity: Not Currently    Comment: widowed, Veteran    Allergies  Allergen Reactions  . Codeine Itching  . Trileptal [Oxcarbazepine]     Other reaction(s): Other (See Comments) Abnormal sodium levels Hyponatremia     Outpatient Medications Prior to Visit  Medication Sig Dispense Refill  . acetaminophen (TYLENOL) 650 MG CR tablet Take 650 mg by mouth 3 (three) times daily.    [name] ALPRAZolam (XANAX) 1 MG tablet Take 1  tablet (1 mg total) by mouth 2 (two) times daily as needed for anxiety or sleep. 20 tablet 0  . Amino Acids-Protein Hydrolys (FEEDING SUPPLEMENT, PRO-STAT SUGAR FREE 64,) LIQD Take 30 mLs by mouth 2 (two) times daily between meals.     [name] amitriptyline (ELAVIL) 100 MG tablet Take 1 tablet (100 mg total) by mouth at bedtime. 30 tablet 0  . amLODipine (NORVASC) 2.5 MG tablet Take 2.5 mg by mouth daily.    [name] aspirin EC 81 MG tablet Take 81 mg by mouth daily.    Roseanne Kaufman Peru-Castor Oil (VENELEX) OINT Apply 1 application topically See admin instructions. Apply to coccyx, sacrum, bilateral buttocks each shift and prn    . bisacodyl (DULCOLAX) 5 MG EC tablet Take 5 mg by mouth  daily as needed for mild constipation or moderate constipation.    . budesonide-formoterol (SYMBICORT) 160-4.5 MCG/ACT inhaler Inhale 2 puffs into the lungs 2 (two) times daily. (Patient not taking: Reported on 04/13/2019) 1 Inhaler 0  . camphor-menthol (ANTI-ITCH) lotion Apply 1 application topically 2 (two) times daily as needed for itching. Sparingly    . collagenase (SANTYL) ointment Instructions: Apply to left heel per treatment orders.    . collagenase (SANTYL) ointment Apply 1 application topically daily. Apply to left buttock wound and bilateral heel ulcerations. 15 g 0  . [employer] DIGESTIVE PROBIOTIC 250 MG capsule Take 250 mg by mouth daily.    [name] docusate sodium (COLACE) 100 MG capsule Take 100 mg by mouth daily as needed for mild constipation.    . enoxaparin (LOVENOX) 40 MG/0.4ML injection Inject 0.4 mLs (40 mg total) into the skin daily. 12 mL 0  . finasteride (PROSCAR) 5 MG tablet Take 1 tablet (5 mg total) by mouth every morning. 30 tablet 0  . gabapentin (NEURONTIN) 800 MG tablet Take 1 tablet (800 mg total) by mouth 3 (three) times daily. 90 tablet 0  . guaiFENesin (MUCINEX) 600 MG 12 hr tablet Take 600 mg by mouth 2 (two) times daily.     [name] latanoprost (XALATAN) 0.005 % ophthalmic solution Place 1 drop into both eyes at bedtime. 2.5 mL 0  . metoprolol tartrate (LOPRESSOR) 25 MG tablet Take 2 tablets (50 mg total) by mouth 2 (two) times daily. (Patient taking differently: Take 12.5 mg by mouth 2 (two) times daily. ) 60 tablet 0  . Multiple Vitamins-Minerals (MULTIVITAMIN WITH MINERALS) tablet Take 1 tablet by mouth daily.    . nitroGLYCERIN (NITROSTAT) 0.4 MG SL tablet Place 1 tablet (0.4 mg total) under the tongue every 5 (five) minutes x 3 doses as needed for chest pain. 25 tablet 0  . OXYGEN Place 4 L/min into the nose continuous.     . simvastatin (ZOCOR) 20 MG tablet Take 20 mg by mouth daily.    . tamsulosin (FLOMAX) 0.4 MG CAPS capsule Take 1 capsule (0.4 mg total) by mouth at  bedtime. 30 capsule 0   No facility-administered medications prior to visit.   Review of Systems  Constitutional: Negative for chills, diaphoresis, fever, malaise/fatigue and weight loss.       Loss of appetite  HENT: Positive for congestion and sore throat. Negative for ear pain.        Dysphagia  Respiratory: Positive for shortness of breath. Negative for cough, hemoptysis, sputum production and wheezing.   Cardiovascular: Positive for chest pain and palpitations. Negative for leg swelling.  Gastrointestinal: Positive for heartburn. Negative for abdominal pain and nausea.  Genitourinary: Negative for frequency.  Musculoskeletal: Positive for back pain and joint pain. Negative for myalgias.  Skin: Negative for itching and rash.  Neurological: Negative for dizziness, weakness and headaches.  Endo/Heme/Allergies: Does not bruise/bleed easily.  Psychiatric/Behavioral: Negative for depression. The patient is not nervous/anxious.     Objective:   Vitals:   05/05/19 0913  BP: 128/68  Pulse: 97  Temp: 98.3 F (36.8 C)  SpO2: 100%  Weight: 173 lb (78.5 kg)  Height: 6' (1.829 m)     Physical Exam: General: Chronically ill-appearing, no acute distress HENT: [place], AT Eyes: EOMI, no scleral icterus Respiratory: Diminished breath sounds bilaterally. Expiratory wheezing present Cardiovascular: RRR, -M/R/G, no JVD GI: BS+, soft, nontender Extremities: LLE wrapped in bandage, no active bleed at time of exam Neuro: AAO x4, CNII-XII grossly intact Skin: Intact, no rashes or bruising Psych: Normal mood, normal affect  Data Reviewed:  Imaging: CTA 02/11/19 - No pulmonary embolism. Bilateral calcified plaques with loculated effusions consistent with asbestos exposure. Mediastinal hilar adenopathy, stable and likely reactive to pleural disease.  PFT: None on file  Labs: CBC    Component Value Date/Time   WBC 9.2 04/17/2019 0418   RBC 3.99 (L) 04/17/2019 0418   HGB 11.0 (L)  04/17/2019 0418   HCT 35.0 (L) 04/17/2019 0418   HCT 49.7 01/02/2019 0606   PLT 395 04/17/2019 0418   MCV 87.7 04/17/2019 0418   MCH 27.6 04/17/2019 0418   MCHC 31.4 04/17/2019 0418   RDW 17.4 (H) 04/17/2019 0418   LYMPHSABS 0.5 (L) 04/13/2019 0027   MONOABS 1.2 (H) 04/13/2019 0027   EOSABS 0.3 04/13/2019 0027   BASOSABS 0.1 04/13/2019 0027   BMET    Component Value Date/Time   NA 140 04/17/2019 0418   K 3.8 04/17/2019 0418   CL 95 (L) 04/17/2019 0418   CO2 30 04/17/2019 0418   GLUCOSE 127 (H) 04/17/2019 0418   BUN 31 (H) 04/17/2019 0418   CREATININE 0.93 04/17/2019 0418   CALCIUM 9.5 04/17/2019 0418   GFRNONAA >60 04/17/2019 0418   GFRAA >60 04/17/2019 0418   Imaging, labs and tests noted above have been reviewed independently by me.    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 Chronic hypoxemia likely multifactorial. Will treat for COPD exacerbation. Will obtain PFTs to confirm obstructive or restrictive lung disease. Will maximize bronchodilator to optimize lung function.  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 This is your EVERYDAY inhaler. START Albuterol every 4-6 hours as needed. This is your RESCUE inhaler. START Prednisone 40 mg for five days for presumed COPD exacerbation.  Health Maintenance Immunization History  Administered Date(s) Administered  . Influenza-Unspecified 02/07/2019  . Tdap 01/25/2019   CT Lung Screen - not indicated  Orders Placed This Encounter   Procedures  . CT Chest Wo Contrast    Standing Status:   Future    Standing Expiration Date:   05/04/2020    Scheduling Instructions:     To be done in one year with office visit to discuss results.    Order Specific Question:   Preferred imaging location?  Answer:   [facility]    Order Specific Question:   Radiology Contrast Protocol - do NOT remove file path    Answer:   [email or web address]  . Pulmonary function test    With follow up appointment to discuss results with Dr. [name] in 3 - 4 months.    Standing Status:   Future    Standing Expiration Date:   09/02/2019    Order Specific Question:   Where should this test be performed?    Answer:   [facility]    Order Specific Question:   Full PFT: includes the following: basic spirometry, spirometry pre & post bronchodilator, diffusion capacity (DLCO), lung volumes    Answer:   Full PFT   Meds ordered this encounter  Medications  . predniSONE (DELTASONE) 20 MG tablet    Sig: Take 40 mg (two tablets) daily for 5 days    Dispense:  10 tablet    Refill:  0  . albuterol (VENTOLIN HFA) 108 (90 Base) MCG/ACT inhaler    Sig: 2 puffs every 4 - 6 hours as needed. This is your rescue inhaler    Dispense:  6.7 g    Refill:  3  . Fluticasone-Umeclidin-Vilant (TRELEGY ELLIPTA) 200-62.5-25 MCG/INH AEPB    Sig: Inhale 1 puff into the lungs daily.    Dispense:  1 each    Refill:  0    Order Specific Question:   Lot Number?    Answer:   [id number]    Order Specific Question:   Manufacturer?    Answer:   GlaxoSmithKline [[id number]]    Order Specific Question:   Quantity    Answer:   1    Return in about 3 months (around 08/03/2019) for after PFTs.  Hiram, MD [facility] 05/05/2019 8:48 AM  Office Number [phone]

## 2019-05-05 NOTE — Telephone Encounter (Signed)
[place] supervisor from patient's facility [facility] called, states that patient had a stage 4 pressure sore on his left foot that was wrapped and being cared for by the facility for the last 2 weeks. Levada Dy states that an employee did accompany the patient and advised the nurse that wanted to weigh him that he was not to stand due to this, but our nurse was insistent on getting a weight. In the process of get the weight the bandage ripped off causing blood to pour from the wound site, per Levada Dy the accompanying nurse from [place] had to advise our staff to wrap it - which was done with guaze and a biohazard bag put around the foot. Levada Dy was concerned that this injury tore the skin completely off; but in speaking with her wound nurse at the facility it was not torn that bad. Levada Dy can be reached at [phone]

## 2019-05-09 ENCOUNTER — Encounter: Payer: Self-pay | Admitting: *Deleted

## 2019-05-11 ENCOUNTER — Telehealth: Payer: Self-pay | Admitting: Pulmonary Disease

## 2019-05-11 NOTE — Telephone Encounter (Signed)
Dr. [name], please advise if you were requesting images from a CT or CXR?

## 2019-05-12 NOTE — Telephone Encounter (Signed)
On review of chart and patient's recent CT, no imaging needed.

## 2019-05-14 ENCOUNTER — Encounter (HOSPITAL_COMMUNITY): Payer: Self-pay

## 2019-05-14 ENCOUNTER — Other Ambulatory Visit: Payer: Self-pay

## 2019-05-14 ENCOUNTER — Emergency Department (HOSPITAL_COMMUNITY): Payer: Medicare Other

## 2019-05-14 ENCOUNTER — Inpatient Hospital Stay (HOSPITAL_COMMUNITY)
Admission: EM | Admit: 2019-05-14 | Discharge: 2019-05-20 | DRG: 177 | Disposition: A | Discharge status: Skilled Nursing Facility | Payer: Medicare Other | Attending: Family Medicine | Admitting: Family Medicine

## 2019-05-14 DIAGNOSIS — J9621 Acute and chronic respiratory failure with hypoxia: Secondary | ICD-10-CM | POA: Diagnosis present

## 2019-05-14 DIAGNOSIS — Z7951 Long term (current) use of inhaled steroids: Secondary | ICD-10-CM

## 2019-05-14 DIAGNOSIS — J440 Chronic obstructive pulmonary disease with acute lower respiratory infection: Secondary | ICD-10-CM | POA: Diagnosis not present

## 2019-05-14 DIAGNOSIS — Z800 Family history of malignant neoplasm of digestive organs: Secondary | ICD-10-CM

## 2019-05-14 DIAGNOSIS — J61 Pneumoconiosis due to asbestos and other mineral fibers: Secondary | ICD-10-CM | POA: Diagnosis not present

## 2019-05-14 DIAGNOSIS — D649 Anemia, unspecified: Secondary | ICD-10-CM | POA: Diagnosis present

## 2019-05-14 DIAGNOSIS — K219 Gastro-esophageal reflux disease without esophagitis: Secondary | ICD-10-CM | POA: Diagnosis present

## 2019-05-14 DIAGNOSIS — I4892 Unspecified atrial flutter: Secondary | ICD-10-CM | POA: Diagnosis not present

## 2019-05-14 DIAGNOSIS — I110 Hypertensive heart disease with heart failure: Secondary | ICD-10-CM | POA: Diagnosis present

## 2019-05-14 DIAGNOSIS — Z66 Do not resuscitate: Secondary | ICD-10-CM | POA: Diagnosis present

## 2019-05-14 DIAGNOSIS — G629 Polyneuropathy, unspecified: Secondary | ICD-10-CM | POA: Diagnosis not present

## 2019-05-14 DIAGNOSIS — Z993 Dependence on wheelchair: Secondary | ICD-10-CM

## 2019-05-14 DIAGNOSIS — U071 COVID-19: Principal | ICD-10-CM | POA: Diagnosis present

## 2019-05-14 DIAGNOSIS — Z9981 Dependence on supplemental oxygen: Secondary | ICD-10-CM

## 2019-05-14 DIAGNOSIS — H409 Unspecified glaucoma: Secondary | ICD-10-CM | POA: Diagnosis not present

## 2019-05-14 DIAGNOSIS — L89620 Pressure ulcer of left heel, unstageable: Secondary | ICD-10-CM | POA: Diagnosis present

## 2019-05-14 DIAGNOSIS — J1282 Pneumonia due to coronavirus disease 2019: Secondary | ICD-10-CM | POA: Diagnosis present

## 2019-05-14 DIAGNOSIS — J449 Chronic obstructive pulmonary disease, unspecified: Secondary | ICD-10-CM

## 2019-05-14 DIAGNOSIS — Z888 Allergy status to other drugs, medicaments and biological substances status: Secondary | ICD-10-CM

## 2019-05-14 DIAGNOSIS — Z885 Allergy status to narcotic agent status: Secondary | ICD-10-CM | POA: Diagnosis not present

## 2019-05-14 DIAGNOSIS — E785 Hyperlipidemia, unspecified: Secondary | ICD-10-CM | POA: Diagnosis present

## 2019-05-14 DIAGNOSIS — N400 Enlarged prostate without lower urinary tract symptoms: Secondary | ICD-10-CM | POA: Diagnosis not present

## 2019-05-14 DIAGNOSIS — I2510 Atherosclerotic heart disease of native coronary artery without angina pectoris: Secondary | ICD-10-CM | POA: Diagnosis present

## 2019-05-14 DIAGNOSIS — J9601 Acute respiratory failure with hypoxia: Secondary | ICD-10-CM | POA: Diagnosis not present

## 2019-05-14 DIAGNOSIS — I5032 Chronic diastolic (congestive) heart failure: Secondary | ICD-10-CM | POA: Diagnosis present

## 2019-05-14 DIAGNOSIS — L89610 Pressure ulcer of right heel, unstageable: Secondary | ICD-10-CM | POA: Diagnosis not present

## 2019-05-14 DIAGNOSIS — F329 Major depressive disorder, single episode, unspecified: Secondary | ICD-10-CM | POA: Diagnosis present

## 2019-05-14 DIAGNOSIS — Z79899 Other long term (current) drug therapy: Secondary | ICD-10-CM

## 2019-05-14 DIAGNOSIS — Z87891 Personal history of nicotine dependence: Secondary | ICD-10-CM

## 2019-05-14 DIAGNOSIS — Z9861 Coronary angioplasty status: Secondary | ICD-10-CM | POA: Diagnosis not present

## 2019-05-14 DIAGNOSIS — Z8781 Personal history of (healed) traumatic fracture: Secondary | ICD-10-CM

## 2019-05-14 DIAGNOSIS — Z7982 Long term (current) use of aspirin: Secondary | ICD-10-CM

## 2019-05-14 DIAGNOSIS — L89154 Pressure ulcer of sacral region, stage 4: Secondary | ICD-10-CM | POA: Diagnosis present

## 2019-05-14 DIAGNOSIS — I739 Peripheral vascular disease, unspecified: Secondary | ICD-10-CM | POA: Diagnosis present

## 2019-05-14 DIAGNOSIS — I714 Abdominal aortic aneurysm, without rupture: Secondary | ICD-10-CM | POA: Diagnosis present

## 2019-05-14 DIAGNOSIS — R0902 Hypoxemia: Secondary | ICD-10-CM

## 2019-05-14 DIAGNOSIS — F419 Anxiety disorder, unspecified: Secondary | ICD-10-CM | POA: Diagnosis present

## 2019-05-14 DIAGNOSIS — Z803 Family history of malignant neoplasm of breast: Secondary | ICD-10-CM

## 2019-05-14 LAB — POC SARS CORONAVIRUS 2 AG -  ED: SARS Coronavirus 2 Ag: POSITIVE — AB

## 2019-05-14 LAB — CBC WITH DIFFERENTIAL/PLATELET
Abs Immature Granulocytes: 0.57 10*3/uL — ABNORMAL HIGH (ref 0.00–0.07)
Basophils Absolute: 0 10*3/uL (ref 0.0–0.1)
Basophils Relative: 0 %
Eosinophils Absolute: 0 10*3/uL (ref 0.0–0.5)
Eosinophils Relative: 0 %
HCT: 28.2 % — ABNORMAL LOW (ref 39.0–52.0)
Hemoglobin: 8.6 g/dL — ABNORMAL LOW (ref 13.0–17.0)
Immature Granulocytes: 5 %
Lymphocytes Relative: 3 %
Lymphs Abs: 0.3 10*3/uL — ABNORMAL LOW (ref 0.7–4.0)
MCH: 28 pg (ref 26.0–34.0)
MCHC: 30.5 g/dL (ref 30.0–36.0)
MCV: 91.9 fL (ref 80.0–100.0)
Monocytes Absolute: 0.5 10*3/uL (ref 0.1–1.0)
Monocytes Relative: 4 %
Neutro Abs: 10.6 10*3/uL — ABNORMAL HIGH (ref 1.7–7.7)
Neutrophils Relative %: 88 %
Platelets: 443 10*3/uL — ABNORMAL HIGH (ref 150–400)
RBC: 3.07 MIL/uL — ABNORMAL LOW (ref 4.22–5.81)
RDW: 17.2 % — ABNORMAL HIGH (ref 11.5–15.5)
WBC: 12 10*3/uL — ABNORMAL HIGH (ref 4.0–10.5)
nRBC: 0.2 % (ref 0.0–0.2)

## 2019-05-14 LAB — LACTIC ACID, PLASMA
Lactic Acid, Venous: 1.6 mmol/L (ref 0.5–1.9)
Lactic Acid, Venous: 1 mmol/L (ref 0.5–1.9)

## 2019-05-14 LAB — COMPREHENSIVE METABOLIC PANEL
ALT: 13 U/L (ref 0–44)
AST: 18 U/L (ref 15–41)
Albumin: 2.4 g/dL — ABNORMAL LOW (ref 3.5–5.0)
Alkaline Phosphatase: 74 U/L (ref 38–126)
Anion gap: 9 (ref 5–15)
BUN: 16 mg/dL (ref 8–23)
CO2: 31 mmol/L (ref 22–32)
Calcium: 8.8 mg/dL — ABNORMAL LOW (ref 8.9–10.3)
Chloride: 96 mmol/L — ABNORMAL LOW (ref 98–111)
Creatinine, Ser: 0.84 mg/dL (ref 0.61–1.24)
GFR calc Af Amer: >60 mL/min (ref >60)
GFR calc non Af Amer: >60 mL/min (ref >60)
Glucose, Bld: 114 mg/dL — ABNORMAL HIGH (ref 70–99)
Potassium: 4.4 mmol/L (ref 3.5–5.1)
Sodium: 136 mmol/L (ref 135–145)
Total Bilirubin: 0.7 mg/dL (ref 0.3–1.2)
Total Protein: 6.5 g/dL (ref 6.5–8.1)

## 2019-05-14 LAB — C-REACTIVE PROTEIN: CRP: 11.8 mg/dL — ABNORMAL HIGH (ref <1.0)

## 2019-05-14 LAB — FIBRINOGEN: Fibrinogen: 591 mg/dL — ABNORMAL HIGH (ref 210–475)

## 2019-05-14 LAB — LACTATE DEHYDROGENASE: LDH: 153 U/L (ref 98–192)

## 2019-05-14 LAB — FERRITIN: Ferritin: 288 ng/mL (ref 24–336)

## 2019-05-14 LAB — PROCALCITONIN: Procalcitonin: <0.1 ng/mL

## 2019-05-14 LAB — D-DIMER, QUANTITATIVE: D-Dimer, Quant: 2.03 ug/mL-FEU — ABNORMAL HIGH (ref 0.00–0.50)

## 2019-05-14 LAB — TRIGLYCERIDES: Triglycerides: 113 mg/dL (ref <150)

## 2019-05-14 MED ORDER — ACETAMINOPHEN 500 MG PO TABS
1000.0000 mg | ORAL_TABLET | Freq: Once | ORAL | Status: AC
  Administered 2019-05-14: 1000 mg via ORAL
  Filled 2019-05-14: qty 2

## 2019-05-14 MED ORDER — SODIUM CHLORIDE 0.9 % IV SOLN: 100.0000 mg | Freq: Every day | INTRAVENOUS | Status: DC

## 2019-05-14 MED ORDER — AMITRIPTYLINE HCL 100 MG PO TABS
100.0000 mg | ORAL_TABLET | Freq: Every day | ORAL | Status: DC
  Administered 2019-05-14 – 2019-05-19 (×6): 100 mg via ORAL
  Filled 2019-05-14 (×3): qty 1
  Filled 2019-05-14: qty 4
  Filled 2019-05-14 (×3): qty 1

## 2019-05-14 MED ORDER — ZINC SULFATE 220 (50 ZN) MG PO CAPS
220.0000 mg | ORAL_CAPSULE | Freq: Every day | ORAL | Status: DC
  Administered 2019-05-14 – 2019-05-20 (×7): 220 mg via ORAL
  Filled 2019-05-14 (×7): qty 1

## 2019-05-14 MED ORDER — FINASTERIDE 5 MG PO TABS
5.0000 mg | ORAL_TABLET | Freq: Every day | ORAL | Status: DC
  Administered 2019-05-15 – 2019-05-20 (×6): 5 mg via ORAL
  Filled 2019-05-14 (×7): qty 1

## 2019-05-14 MED ORDER — ASCORBIC ACID 500 MG PO TABS
500.0000 mg | ORAL_TABLET | Freq: Every day | ORAL | Status: DC
  Administered 2019-05-14 – 2019-05-20 (×7): 500 mg via ORAL
  Filled 2019-05-14 (×7): qty 1

## 2019-05-14 MED ORDER — AMLODIPINE BESYLATE 5 MG PO TABS
2.5000 mg | ORAL_TABLET | Freq: Every day | ORAL | Status: DC
  Administered 2019-05-14 – 2019-05-20 (×7): 2.5 mg via ORAL
  Filled 2019-05-14 (×7): qty 1

## 2019-05-14 MED ORDER — DOCUSATE SODIUM 100 MG PO CAPS
100.0000 mg | ORAL_CAPSULE | Freq: Every day | ORAL | Status: DC | PRN
  Administered 2019-05-19: 100 mg via ORAL
  Filled 2019-05-14: qty 1

## 2019-05-14 MED ORDER — ENOXAPARIN SODIUM 40 MG/0.4ML ~~LOC~~ SOLN
40.0000 mg | SUBCUTANEOUS | Status: DC
  Administered 2019-05-14 – 2019-05-19 (×6): 40 mg via SUBCUTANEOUS
  Filled 2019-05-14 (×7): qty 0.4

## 2019-05-14 MED ORDER — LATANOPROST 0.005 % OP SOLN
1.0000 [drp] | Freq: Every day | OPHTHALMIC | Status: DC
  Administered 2019-05-15 – 2019-05-19 (×6): 1 [drp] via OPHTHALMIC
  Filled 2019-05-14 (×2): qty 2.5

## 2019-05-14 MED ORDER — FLUTICASONE FUROATE-VILANTEROL 200-25 MCG/INH IN AEPB
1.0000 | INHALATION_SPRAY | Freq: Every day | RESPIRATORY_TRACT | Status: DC
  Administered 2019-05-15 – 2019-05-20 (×6): 1 via RESPIRATORY_TRACT
  Filled 2019-05-14 (×2): qty 28

## 2019-05-14 MED ORDER — METOPROLOL TARTRATE 25 MG PO TABS
12.5000 mg | ORAL_TABLET | Freq: Two times a day (BID) | ORAL | Status: DC
  Administered 2019-05-14 – 2019-05-20 (×12): 12.5 mg via ORAL
  Filled 2019-05-14 (×13): qty 1

## 2019-05-14 MED ORDER — ACETAMINOPHEN 325 MG PO TABS: 650.0000 mg | ORAL_TABLET | Freq: Four times a day (QID) | ORAL | Status: DC | PRN

## 2019-05-14 MED ORDER — TAMSULOSIN HCL 0.4 MG PO CAPS
0.4000 mg | ORAL_CAPSULE | Freq: Every day | ORAL | Status: DC
  Administered 2019-05-14 – 2019-05-19 (×6): 0.4 mg via ORAL
  Filled 2019-05-14 (×7): qty 1

## 2019-05-14 MED ORDER — NITROGLYCERIN 0.4 MG SL SUBL: 0.4000 mg | SUBLINGUAL_TABLET | SUBLINGUAL | Status: DC | PRN

## 2019-05-14 MED ORDER — BISACODYL 5 MG PO TBEC: 5.0000 mg | DELAYED_RELEASE_TABLET | Freq: Every day | ORAL | Status: DC | PRN

## 2019-05-14 MED ORDER — UMECLIDINIUM BROMIDE 62.5 MCG/INH IN AEPB
1.0000 | INHALATION_SPRAY | Freq: Every day | RESPIRATORY_TRACT | Status: DC
  Administered 2019-05-15 – 2019-05-20 (×6): 1 via RESPIRATORY_TRACT
  Filled 2019-05-14 (×2): qty 7

## 2019-05-14 MED ORDER — GABAPENTIN 800 MG PO TABS
800.0000 mg | ORAL_TABLET | Freq: Three times a day (TID) | ORAL | Status: DC
  Filled 2019-05-14: qty 1

## 2019-05-14 MED ORDER — ACETAMINOPHEN 325 MG PO TABS
650.0000 mg | ORAL_TABLET | Freq: Three times a day (TID) | ORAL | Status: DC
  Administered 2019-05-14 – 2019-05-20 (×18): 650 mg via ORAL
  Filled 2019-05-14 (×19): qty 2

## 2019-05-14 MED ORDER — COLLAGENASE 250 UNIT/GM EX OINT
TOPICAL_OINTMENT | Freq: Every day | CUTANEOUS | Status: DC
  Filled 2019-05-14 (×2): qty 30

## 2019-05-14 MED ORDER — IPRATROPIUM-ALBUTEROL 20-100 MCG/ACT IN AERS
1.0000 | INHALATION_SPRAY | Freq: Four times a day (QID) | RESPIRATORY_TRACT | Status: DC
  Administered 2019-05-15 – 2019-05-20 (×23): 1 via RESPIRATORY_TRACT
  Filled 2019-05-14 (×2): qty 4

## 2019-05-14 MED ORDER — GUAIFENESIN-DM 100-10 MG/5ML PO SYRP: 10.0000 mL | ORAL_SOLUTION | ORAL | Status: DC | PRN

## 2019-05-14 MED ORDER — GABAPENTIN 300 MG PO CAPS
800.0000 mg | ORAL_CAPSULE | Freq: Three times a day (TID) | ORAL | Status: DC
  Administered 2019-05-14: 800 mg via ORAL
  Filled 2019-05-14: qty 2

## 2019-05-14 MED ORDER — ADULT MULTIVITAMIN W/MINERALS CH
1.0000 | ORAL_TABLET | Freq: Every day | ORAL | Status: DC
  Administered 2019-05-14 – 2019-05-20 (×7): 1 via ORAL
  Filled 2019-05-14 (×7): qty 1

## 2019-05-14 MED ORDER — SIMVASTATIN 20 MG PO TABS
20.0000 mg | ORAL_TABLET | Freq: Every day | ORAL | Status: DC
  Administered 2019-05-14 – 2019-05-20 (×7): 20 mg via ORAL
  Filled 2019-05-14 (×8): qty 1

## 2019-05-14 MED ORDER — COLLAGENASE 250 UNIT/GM EX OINT
1.0000 "application " | TOPICAL_OINTMENT | Freq: Every day | CUTANEOUS | Status: DC
  Administered 2019-05-15 – 2019-05-20 (×6): 1 via TOPICAL
  Filled 2019-05-14 (×2): qty 30

## 2019-05-14 MED ORDER — FLUTICASONE-UMECLIDIN-VILANT 200-62.5-25 MCG/INH IN AEPB: 1.0000 | INHALATION_SPRAY | Freq: Every day | RESPIRATORY_TRACT | Status: DC

## 2019-05-14 MED ORDER — PRO-STAT SUGAR FREE PO LIQD
30.0000 mL | Freq: Two times a day (BID) | ORAL | Status: DC
  Administered 2019-05-15 – 2019-05-20 (×8): 30 mL via ORAL
  Filled 2019-05-14 (×8): qty 30

## 2019-05-14 MED ORDER — ALPRAZOLAM 0.5 MG PO TABS: 1.0000 mg | ORAL_TABLET | Freq: Two times a day (BID) | ORAL | Status: DC | PRN

## 2019-05-14 MED ORDER — SACCHAROMYCES BOULARDII 250 MG PO CAPS
250.0000 mg | ORAL_CAPSULE | Freq: Every day | ORAL | Status: DC
  Administered 2019-05-15 – 2019-05-20 (×6): 250 mg via ORAL
  Filled 2019-05-14 (×7): qty 1

## 2019-05-14 MED ORDER — ASPIRIN EC 81 MG PO TBEC
81.0000 mg | DELAYED_RELEASE_TABLET | Freq: Every day | ORAL | Status: DC
  Administered 2019-05-14 – 2019-05-20 (×7): 81 mg via ORAL
  Filled 2019-05-14 (×7): qty 1

## 2019-05-14 MED ORDER — DEXAMETHASONE SODIUM PHOSPHATE 10 MG/ML IJ SOLN
10.0000 mg | Freq: Once | INTRAMUSCULAR | Status: AC
  Administered 2019-05-14: 10 mg via INTRAVENOUS
  Filled 2019-05-14: qty 1

## 2019-05-14 MED ORDER — DEXAMETHASONE 6 MG PO TABS
6.0000 mg | ORAL_TABLET | ORAL | Status: DC
  Administered 2019-05-14: 6 mg via ORAL
  Filled 2019-05-14: qty 2

## 2019-05-14 MED ORDER — CAMPHOR-MENTHOL 0.5-0.5 % EX LOTN
1.0000 "application " | TOPICAL_LOTION | Freq: Two times a day (BID) | CUTANEOUS | Status: DC | PRN
  Filled 2019-05-14: qty 222

## 2019-05-14 MED ORDER — SODIUM CHLORIDE 0.9 % IV SOLN
200.0000 mg | Freq: Once | INTRAVENOUS | Status: AC
  Administered 2019-05-15: 200 mg via INTRAVENOUS
  Filled 2019-05-14 (×2): qty 40

## 2019-05-14 MED ORDER — GUAIFENESIN ER 600 MG PO TB12
600.0000 mg | ORAL_TABLET | Freq: Two times a day (BID) | ORAL | Status: DC
  Administered 2019-05-14 – 2019-05-20 (×12): 600 mg via ORAL
  Filled 2019-05-14 (×13): qty 1

## 2019-05-14 NOTE — ED Triage Notes (Signed)
Pt bib ems was brought in due to SOB and covid positive from nursing home [facility] on 15L. Pt aox4 and 100% on 5L. Pt has copd, and hxt of asbestos exposure.  Hr 120 bp 164/98,  o2 99% 15L 26rr

## 2019-05-14 NOTE — H&P (Addendum)
History and Physical    Curtis [name] [id number] DOB: August 18, 1945 DOA: 05/14/2019  PCP: [name], MD  Patient coming from: Nursing home, [facility]   Chief Complaint: SOB  HPI: Curtis [name] is a 74 y.o. male with medical history significant of Pulmonary Asbestosis, CAD s/p PCI x 2,Hx of AVNRT s/p Ablation, COPD/Chronic Hypoxemic respiratory failure (4 L home oxygen), HTN, HLD, GERD, and PAD who presents with worsening sob and positive COVID test.  Patient reports he was at baseline state of health up until 2 weeks prior.  He states at that time he began to develop cold like-symptoms.  He states he did not have fevers or chills, but had some shortness of breath and cough.  He states he is always on oxygen due to his COPD, requiring 4L at baseline and with activity though he is largely sedentary or wheelchair bound due to his multiple comorbidities and recent hip fracture.  He reports he rarely utilizes a walker to ambulate.  He states he has been around no one except the staff that comes in to assist, as he requires assistance with medications and most activity.  He reports that over the past few days his symptoms worsened, predominantly his SOB.  He reports that his oxygen requirement has gone up, he feels body aches and weakness throughout.  He still has not subjectively felt feverish but reports they have told him he is running fevers when they check.  He has not had any diarrhea, no anosmia or loss of taste.  He reports his facility stated his COVID test from two days prior returned positive.  Patient reports he quit smoking 13 years prior, he denies any alcohol use or drug use.  His son lives nearby his nursing facility.  He is adamant he would never wish to be on a ventilator.  He initially reported that he would want chest compressions but when notified if he would want that intubation is necessary to maintain airway, at the very least in the short term but likely more  prolonged course given his acute illness and comorbidities.  Once given this information, patient was clear that he would not want to be on a ventilator even for short period of time and wished to be DNR/DNI.   Review of Systems: As per HPI otherwise 10 point review of systems negative.    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 s/p cath in 2015 showing occluded RCA and high-grade LCx stenosis treated with DESx2 to LCx   Cancer [facility])    Depression    Diverticulitis    hospital 2011 [facility]   Diverticulosis    GERD (gastroesophageal reflux disease)    Glaucoma 2016   bilateral   Hernia of unspecified site of abdominal cavity without mention of obstruction or gangrene    hiatal   Hyperlipidemia LDL goal <70 04/20/2014   Hypertension    IBS (irritable bowel syndrome)    Myocardial infarction ([place]) 01/2014   NSTEMI   Peripheral vascular disease (HCC)    ankle brachial index of 0.79 on the right and 0.61 on the left.    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    SOB (shortness of breath)     Past Surgical History:  Procedure Laterality Date   CARDIAC CATHETERIZATION     CATARACT EXTRACTION     CORONARY ANGIOPLASTY     HERNIA REPAIR     INTRAMEDULLARY (IM) NAIL INTERTROCHANTERIC Left 04/14/2019   Procedure: INTRAMEDULLARY (IM) NAIL INTERTROCHANTRIC;  Surgeon: Rod Can, MD;  Location: [facility];  Service: Orthopedics;  Laterality: Left;   LEFT HEART CATHETERIZATION WITH CORONARY ANGIOGRAM N/A 01/17/2014   Procedure: LEFT HEART CATHETERIZATION WITH CORONARY ANGIOGRAM;  Surgeon: Leonie Man, MD;  Location: [facility] CATH LAB;  Service: Cardiovascular;  Laterality: N/A;   left shoulder      x 3   NISSEN FUNDOPLICATION     right elbow surgery     x  2   right knee arthroscopy     squamous cell skin cancer     Left Hand   SUPRAVENTRICULAR TACHYCARDIA ABLATION N/A 01/16/2014   Procedure: SUPRAVENTRICULAR TACHYCARDIA ABLATION;  Surgeon: Evans Lance, MD;  Location: [facility] CATH LAB;  Service: Cardiovascular;  Laterality: N/A;     reports that he quit smoking about 14 years ago. His smoking use included cigarettes. He has a 125.00 pack-year smoking history. He quit smokeless tobacco use about 7 years ago.  His smokeless tobacco use included chew. He reports that he does not drink alcohol or use drugs.  Allergies  Allergen Reactions   Codeine Itching   Trileptal [Oxcarbazepine]     Other reaction(s): Other (See Comments) Abnormal sodium levels Hyponatremia    Family History  Problem Relation Age of Onset   Colon cancer Mother    Breast cancer Sister 62   Colon cancer Maternal Aunt    Colon cancer Maternal Uncle    Heart attack Neg Hx    Stroke Neg Hx      Prior to Admission medications   Medication Sig Start Date End Date Taking? Authorizing Provider  acetaminophen (TYLENOL) 650 MG CR tablet Take 650 mg by mouth 3 (three) times daily.    [provider]  albuterol (VENTOLIN HFA) 108 (90 Base) MCG/ACT inhaler 2 puffs every 4 - 6 hours as needed. This is your rescue inhaler 05/05/19   Margaretha Seeds, MD  ALPRAZolam [name]) 1 MG tablet Take 1 tablet (1 mg total) by mouth 2 (two) times daily as needed for anxiety or sleep. 04/19/19   Mikhail, Velta Addison, DO  Amino Acids-Protein Hydrolys (FEEDING SUPPLEMENT, PRO-STAT SUGAR FREE 64,) LIQD Take 30 mLs by mouth 2 (two) times daily between meals.     [provider]  amitriptyline (ELAVIL) 100 MG tablet Take 1 tablet (100 mg total) by mouth at bedtime. 03/29/19   Gerlene Fee, NP  amLODipine (NORVASC) 2.5 MG tablet Take 2.5 mg by mouth daily.    [provider]  aspirin EC 81 MG tablet Take 81 mg by mouth daily.    [provider]  Janne [facility]) OINT Apply 1 application topically See admin instructions. Apply to coccyx, sacrum, bilateral buttocks each shift and prn    [provider]  bisacodyl (DULCOLAX) 5 MG EC tablet Take 5 mg by mouth daily as needed for mild constipation or moderate constipation.    [provider]  budesonide-formoterol (SYMBICORT) 160-4.5 MCG/ACT inhaler Inhale 2 puffs into the lungs 2 (two) times daily. 03/29/19   Gerlene Fee, NP  camphor-menthol (ANTI-ITCH) lotion Apply 1 application topically 2 (two) times daily as needed for itching. Sparingly    [provider]  collagenase (SANTYL) ointment Instructions: Apply to left heel  per treatment orders.    [provider]  collagenase (SANTYL) ointment Apply 1 application topically daily. Apply to left buttock wound and bilateral heel ulcerations. 03/29/19   Gerlene Fee, NP  [employer] DIGESTIVE PROBIOTIC 250 MG capsule Take 250 mg by mouth daily. 04/04/19   [provider]  docusate sodium (COLACE) 100 MG capsule Take 100 mg by mouth daily as needed for mild constipation.    [provider]  enoxaparin (LOVENOX) 40 MG/0.4ML injection Inject 0.4 mLs (40 mg total) into the skin daily. 04/16/19 05/16/19  Swinteck, Aaron Edelman, MD  finasteride (PROSCAR) 5 MG tablet Take 1 tablet (5 mg total) by mouth every morning. 03/29/19   Gerlene Fee, NP  Fluticasone-Umeclidin-Vilant (TRELEGY ELLIPTA) 200-62.5-25 MCG/INH AEPB Inhale 1 puff into the lungs daily. 05/05/19   Margaretha Seeds, MD  gabapentin (NEURONTIN) 800 MG tablet Take 1 tablet (800 mg total) by mouth 3 (three) times daily. 03/29/19   Gerlene Fee, NP  guaiFENesin (MUCINEX) 600 MG 12 hr tablet Take 600 mg by mouth 2 (two) times daily.     [provider]  latanoprost (XALATAN) 0.005 % ophthalmic solution Place 1 drop into both eyes at bedtime. 03/29/19   Gerlene Fee, NP  metoprolol tartrate (LOPRESSOR) 25 MG tablet Take 2  tablets (50 mg total) by mouth 2 (two) times daily. Patient taking differently: Take 12.5 mg by mouth 2 (two) times daily.  03/29/19   Gerlene Fee, NP  Multiple Vitamins-Minerals (MULTIVITAMIN WITH MINERALS) tablet Take 1 tablet by mouth daily. 04/19/19   Mikhail, Velta Addison, DO  nitroGLYCERIN (NITROSTAT) 0.4 MG SL tablet Place 1 tablet (0.4 mg total) under the tongue every 5 (five) minutes x 3 doses as needed for chest pain. 03/29/19   Gerlene Fee, NP  OXYGEN Place 4 L/min into the nose continuous.     [provider]  predniSONE (DELTASONE) 20 MG tablet Take 40 mg (two tablets) daily for 5 days 05/05/19   Margaretha Seeds, MD  simvastatin (ZOCOR) 20 MG tablet Take 20 mg by mouth daily.    [provider]  tamsulosin (FLOMAX) 0.4 MG CAPS capsule Take 1 capsule (0.4 mg total) by mouth at bedtime. 03/29/19   Gerlene Fee, NP  lamoTRIgine (LAMICTAL) 100 MG tablet Take 1 tablet (100 mg total) by mouth 2 (two) times daily. 01/23/16 01/29/16  Kathrynn Ducking, MD    Physical Exam: Vitals:   05/14/19 1725 05/14/19 1730 05/14/19 1745 05/14/19 1800  BP:  132/78 (!) 147/69 139/65  Pulse: (!) 110 (!) 113 (!) 108 (!) 109  Resp:   (!) 22 (!) 25  Temp:      TempSrc:      SpO2: 93% 96% 96% 96%  Weight:      Height:        Vitals:   05/14/19 1725 05/14/19 1730 05/14/19 1745 05/14/19 1800  BP:  132/78 (!) 147/69 139/65  Pulse: (!) 110 (!) 113 (!) 108 (!) 109  Resp:   (!) 22 (!) 25  Temp:      TempSrc:      SpO2: 93% 96% 96% 96%  Weight:      Height:        Constitutional: NAD, on high flow oxygen, but alert and comfortable Eyes: EOMI, anicteric sclera ENMT: Mucous membranes are moist. Posterior pharynx clear of any exudate or lesions.Normal dentition.  Neck: normal, supple, no masses, no thyromegaly Respiratory: prolonged expiratory phase, crackles not appreciated. Cardiovascular:  RRR, no mrg Abdomen: no tenderness, no masses palpated. No hepatosplenomegaly.  Bowel sounds positive.  Musculoskeletal: no clubbing / cyanosis. No joint deformity upper and lower extremities. Good ROM, no contractures. Normal muscle tone.  Skin: no rashes, lesions, ulcers. No induration.  L heel ulcer.  Reported sacral decub as well (not visualized) Neurologic: CN 2-12 grossly intact. No new motor or sensory deficits. Psychiatric: Normal judgment and insight. Alert and oriented x 3. Normal mood.    Labs on Admission: I have personally reviewed following labs and imaging studies  CBC: Recent Labs  Lab 05/14/19 1719  WBC 12.0*  NEUTROABS 10.6*  HGB 8.6*  HCT 28.2*  MCV 91.9  PLT 123456*   Basic Metabolic Panel: Recent Labs  Lab 05/14/19 1719  NA 136  K 4.4  CL 96*  CO2 31  GLUCOSE 114*  BUN 16  CREATININE 0.84  CALCIUM 8.8*   GFR: Estimated Creatinine Clearance: 84.7 mL/min (by C-G formula based on SCr of 0.84 mg/dL). Liver Function Tests: Recent Labs  Lab 05/14/19 1719  AST 18  ALT 13  ALKPHOS 74  BILITOT 0.7  PROT 6.5  ALBUMIN 2.4*   No results for input(s): LIPASE, AMYLASE in the last 168 hours. No results for input(s): AMMONIA in the last 168 hours. Coagulation Profile: No results for input(s): INR, PROTIME in the last 168 hours. Cardiac Enzymes: No results for input(s): CKTOTAL, CKMB, CKMBINDEX, TROPONINI in the last 168 hours. BNP (last 3 results) No results for input(s): PROBNP in the last 8760 hours. HbA1C: No results for input(s): HGBA1C in the last 72 hours. CBG: No results for input(s): GLUCAP in the last 168 hours. Lipid Profile: Recent Labs    05/14/19 1719  TRIG 113   Thyroid Function Tests: No results for input(s): TSH, T4TOTAL, FREET4, T3FREE, THYROIDAB in the last 72 hours. Anemia Panel: Recent Labs    05/14/19 1719  FERRITIN 288   Urine analysis:    Component Value Date/Time   COLORURINE YELLOW 04/16/2019 2049   APPEARANCEUR CLEAR 04/16/2019 2049   LABSPEC 1.011 04/16/2019 2049   PHURINE 5.0 04/16/2019  2049   [place] 04/16/2019 2049   [place] NEGATIVE 04/16/2019 2049   [place] NEGATIVE 04/16/2019 2049   KETONESUR 5 (A) 04/16/2019 2049   PROTEINUR NEGATIVE 04/16/2019 2049   UROBILINOGEN 0.2 02/12/2015 1859   NITRITE NEGATIVE 04/16/2019 2049   LEUKOCYTESUR NEGATIVE 04/16/2019 2049    Radiological Exams on Admission: DG Chest Port 1 View  Result Date: 05/14/2019 CLINICAL DATA:  Shortness of breath in a patient who is COVID-19 positive. EXAM: PORTABLE CHEST 1 VIEW COMPARISON:  Single-view of the chest 04/13/2019. FINDINGS: Extensive, patchy bilateral airspace disease has progressed since the prior examination. Calcified pleural plaques and pleural thickening in the right chest are again seen. Heart size is enlarged. Aortic atherosclerosis. IMPRESSION: Worsening of patchy bilateral airspace disease most consistent with pneumonia. Calcified pleural plaques and pleural scarring along the lateral right chest wall. Cardiomegaly. Atherosclerosis. Electronically Signed   By: Inge Rise M.D.   On: 05/14/2019 17:42    Assessment/Plan Curtis [name] is a 74 y.o. male with medical history significant of Pulmonary Asbestosis, CAD s/p PCI x 2,Hx of AVNRT s/p Ablation, COPD/Chronic Hypoxemic respiratory failure (4 L home oxygen), HTN, HLD, GERD, and PAD who presents with acute hypoxemic respiratory failure in setting of COVID Pneumonia.  # Acute on Chronic Hypoxemic respiratory failure # COVID 19 Pneumonia # COPD - positive test from nursing facility on 1/7 (our rapid  test has returned positive as well), now on high flow nasal cannula at 32 L, but appearing comfortable - CXR reveals worsening of patchy bilateral airspace disease c/w COVID, and calcified pleural plaques and scarring consistent with prior hx - procalcitonin low, CXR with focal consolidation, low suspicion for concurrent bacterial pneumonia - continue management with Remdesivir and Dexamethasone - continue pta inhalers,  proning, ICS, RT consult - trend inflammatory markers, consider CTA if worsening D-Dimer and unimproved or worsening oxygen requirements - Admit to [facility] - continue Vit C/Zinc (consider benefit:risk ratio) - oxygen supplementation, baseline oxygen needs are 4L - Follows with Pulmonology, Dr. [name]  # CAD s/p PCI # AVNRT s/p ablation # Hx of Atrial Flutter # PAD - continue aspirin, statin, metoprolol - follows with Dr. [name], consideration to return to therapeutic [facility] for A Flutter (presently in [place])  # Anemia, normocytic - no reported hx of blood loss and lower than 1 month prior but Hgb of 8.6 is more c/w prior baseline - continue to monitor  # HTN - continue amlodipine, metoprolol  # HLD - continue simvastatin  # Glaucoma - continue eye gtts  # BPH - continue finasteride and flomax  # Peripheral neuropathy - continue gabapentin  DVT prophylaxis: Lovenox Code Status: DNR/DNI Disposition Plan: Admit to [place] Admission status: PCU/SDU   Truddie Hidden MD [facility] Pager [phone]  If 7PM-7AM, please contact night-coverage [email or web address] Password [facility]  05/14/2019, 6:37 PM

## 2019-05-14 NOTE — ED Provider Notes (Signed)
[facility] EMERGENCY DEPARTMENT Provider Note   CSN: [id number] Arrival date & time: 05/14/19  1647     History Chief Complaint  Patient presents with  . Shortness of Breath    Curtis [name] is a 74 y.o. male.  HPI      74yo male with history of chronic hypoxemic respiratory failure asbestosis and COPD on 5L of O2 chronically, chronic diastolic heart failure, CAD, htn, hlpd, AAA, recent COVID 19 positive test at his facility 2 days ago presents with concern for shortness of breath and hypoxia.  Patient reports he has had cough for 2 weeks and was seen at [facility] and given prednisone and started on a new inhaler (Trelegy).  Reports he has continued cough and shortness of breath. Reports his dyspnea is often brought on by anxiety or dyspnea brings on his anxiety.  Has felt fatigued.  Did not know of fevers but febrile in the ED.  Reports earlier today he did have sensation of severe dyspnea, anxiety, chest pain but denies current chest pain and feels his breathing is better with oxygen treatments.  He is alert and oriented. Reports he DOES NOT want to be on a ventilator and has discussed this with his son. "If I end up on a ventilator I will own this place."  Initially states he would still want chest compressions but does not want this when told that we do assist ventilation as a part of CPR and states he would rather be DNR.     Past Medical History:  Diagnosis Date  . AAA (abdominal aortic aneurysm), stable 04/20/2014  . Arthritis   . 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 CAD (coronary artery disease)    a. s/p cath in 2015 showing occluded RCA and high-grade LCx stenosis treated with DESx2 to LCx  . Cancer ([place])   . Depression   . Diverticulitis    hospital 2011 [facility]  . Diverticulosis   . GERD (gastroesophageal reflux disease)   . 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 Hernia of unspecified site of abdominal cavity without mention of obstruction or gangrene    hiatal    . Hyperlipidemia LDL goal <70 04/20/2014  . Hypertension   . IBS (irritable bowel syndrome)   . Myocardial infarction ([place]) 01/2014   NSTEMI  . Peripheral vascular disease (HCC)    ankle brachial index of 0.79 on the right and 0.61 on the left.   . Pneumonia   . 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[place])   . Reflex sympathetic dystrophy   . 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 SOB (shortness of breath)     Patient Active Problem List   Diagnosis Date Noted  . Pneumonia due to COVID-19 virus 05/14/2019  . Closed left hip fracture ([place]) 04/13/2019  . Hip fracture ([place]) 04/13/2019  . Closed comminuted intertrochanteric fracture of proximal end of left femur ([place])   . Protein-calorie malnutrition, severe ([place]) 03/12/2019  . Chronic diastolic heart failure ([place]) 03/02/2019  . Unstageable pressure ulcer of left buttock ([place]) 02/21/2019  . Decubitus ulcer of heel, bilateral, unstageable ([place]) 02/21/2019  . Anasarca 02/20/2019  . BPH with urinary obstruction 02/20/2019  . Sacral decubitus ulcer, stage IV ([place])   . Physical deconditioning 02/04/2019  . Pressure injury of deep tissue of left heel 01/27/2019  . Atrial flutter, chronic ([place]) 01/15/2019  . Bilateral open angle glaucoma 01/15/2019  . Anxiety disorder 01/15/2019  . Pressure ulcer of right heel, stage 2 ([place])  01/03/2019  . Chronic respiratory failure with hypoxia and hypercapnia ([place]) 01/02/2019  . Situational mixed anxiety and depressive disorder 02/28/2018  . Carotid artery disease ([place]) 02/27/2016  . Hereditary and idiopathic peripheral neuropathy 01/30/2015  . Anemia, iron deficiency 05/08/2014  . Urinary retention 05/08/2014  . Shortness of breath   . Hyperlipidemia LDL goal <70 04/20/2014  . AAA (abdominal aortic aneurysm), stable 04/20/2014  . SOB (shortness of breath)   . Peripheral vascular disease ([place])   . Presence of drug coated stent in left circumflex coronary artery: Promus DES 3.5 mm x 38 mm (3.9 mm)  01/17/2014    Class: Acute  . CAD (coronary artery disease), native coronary artery   . SVT (supraventricular tachycardia) s/p ablation 01/16/2014 01/16/2014  . 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  . TIA (transient ischemic attack) 09/06/2013  . HTN (hypertension) 09/05/2013  . TOBACCO ABUSE, hx 08/23/2007  . COPD with chronic bronchitis ([place]) 08/12/2007  . Asbestosis ([place]) 08/12/2007  . Reflex sympathetic dystrophy 05/29/2007  . GERD 05/29/2007    Past Surgical History:  Procedure Laterality Date  . CARDIAC CATHETERIZATION    . CATARACT EXTRACTION    . CORONARY ANGIOPLASTY    . HERNIA REPAIR    . INTRAMEDULLARY (IM) NAIL INTERTROCHANTERIC Left 04/14/2019   Procedure: INTRAMEDULLARY (IM) NAIL INTERTROCHANTRIC;  Surgeon: Rod Can, MD;  Location: [facility];  Service: Orthopedics;  Laterality: Left;  . LEFT HEART CATHETERIZATION WITH CORONARY ANGIOGRAM N/A 01/17/2014   Procedure: LEFT HEART CATHETERIZATION WITH CORONARY ANGIOGRAM;  Surgeon: Leonie Man, MD;  Location: [facility] CATH LAB;  Service: Cardiovascular;  Laterality: N/A;  . left shoulder      x 3  . NISSEN FUNDOPLICATION    . right elbow surgery     x 2  . right knee arthroscopy    . squamous cell skin cancer     Left Hand  . SUPRAVENTRICULAR TACHYCARDIA ABLATION N/A 01/16/2014   Procedure: SUPRAVENTRICULAR TACHYCARDIA ABLATION;  Surgeon: Evans Lance, MD;  Location: [facility] CATH LAB;  Service: Cardiovascular;  Laterality: N/A;       Family History  Problem Relation Age of Onset  . Colon cancer Mother   . Breast cancer Sister 32  . Colon cancer Maternal Aunt   . Colon cancer Maternal Uncle   . Heart attack Neg Hx   . Stroke Neg Hx     Social History   Tobacco Use  . Smoking status: Former Smoker    Packs/day: 2.50    Years: 50.00    Pack years: 125.00    Types: Cigarettes    Quit date: 05/05/2005    Years since quitting: 14.0  . Smokeless tobacco: Former Systems developer    Types: [place] date:  05/03/2012  Substance Use Topics  . Alcohol use: No    Alcohol/week: 0.0 standard drinks    Comment: last use 2013  . Drug use: No    Home Medications Prior to Admission medications   Medication Sig Start Date End Date Taking? Authorizing Provider  acetaminophen (TYLENOL) 650 MG CR tablet Take 650 mg by mouth 3 (three) times daily.    [provider]  albuterol (VENTOLIN HFA) 108 (90 Base) MCG/ACT inhaler 2 puffs every 4 - 6 hours as needed. This is your rescue inhaler 05/05/19   Margaretha Seeds, MD  ALPRAZolam [name]) 1 MG tablet Take 1 tablet (1 mg total) by mouth 2 (two) times daily as needed for anxiety or sleep. 04/19/19  Mikhail, Clinical biochemist, DO  Amino Acids-Protein Hydrolys (FEEDING SUPPLEMENT, PRO-STAT SUGAR FREE 64,) LIQD Take 30 mLs by mouth 2 (two) times daily between meals.     [provider]  amitriptyline (ELAVIL) 100 MG tablet Take 1 tablet (100 mg total) by mouth at bedtime. 03/29/19   Gerlene Fee, NP  amLODipine (NORVASC) 2.5 MG tablet Take 2.5 mg by mouth daily.    [provider]  aspirin EC 81 MG tablet Take 81 mg by mouth daily.    [provider]  Janne Lab Oil [facility]) OINT Apply 1 application topically See admin instructions. Apply to coccyx, sacrum, bilateral buttocks each shift and prn    [provider]  bisacodyl (DULCOLAX) 5 MG EC tablet Take 5 mg by mouth daily as needed for mild constipation or moderate constipation.    [provider]  budesonide-formoterol (SYMBICORT) 160-4.5 MCG/ACT inhaler Inhale 2 puffs into the lungs 2 (two) times daily. 03/29/19   Gerlene Fee, NP  camphor-menthol (ANTI-ITCH) lotion Apply 1 application topically 2 (two) times daily as needed for itching. Sparingly    [provider]  collagenase (SANTYL) ointment Instructions: Apply to left heel per treatment orders.    [provider]  collagenase (SANTYL) ointment Apply 1 application topically  daily. Apply to left buttock wound and bilateral heel ulcerations. 03/29/19   Gerlene Fee, NP  [employer] DIGESTIVE PROBIOTIC 250 MG capsule Take 250 mg by mouth daily. 04/04/19   [provider]  docusate sodium (COLACE) 100 MG capsule Take 100 mg by mouth daily as needed for mild constipation.    [provider]  enoxaparin (LOVENOX) 40 MG/0.4ML injection Inject 0.4 mLs (40 mg total) into the skin daily. 04/16/19 05/16/19  Swinteck, Aaron Edelman, MD  finasteride (PROSCAR) 5 MG tablet Take 1 tablet (5 mg total) by mouth every morning. 03/29/19   Gerlene Fee, NP  Fluticasone-Umeclidin-Vilant (TRELEGY ELLIPTA) 200-62.5-25 MCG/INH AEPB Inhale 1 puff into the lungs daily. 05/05/19   Margaretha Seeds, MD  gabapentin (NEURONTIN) 800 MG tablet Take 1 tablet (800 mg total) by mouth 3 (three) times daily. 03/29/19   Gerlene Fee, NP  guaiFENesin (MUCINEX) 600 MG 12 hr tablet Take 600 mg by mouth 2 (two) times daily.     [provider]  latanoprost (XALATAN) 0.005 % ophthalmic solution Place 1 drop into both eyes at bedtime. 03/29/19   Gerlene Fee, NP  metoprolol tartrate (LOPRESSOR) 25 MG tablet Take 2 tablets (50 mg total) by mouth 2 (two) times daily. Patient taking differently: Take 12.5 mg by mouth 2 (two) times daily.  03/29/19   Gerlene Fee, NP  Multiple Vitamins-Minerals (MULTIVITAMIN WITH MINERALS) tablet Take 1 tablet by mouth daily. 04/19/19   Mikhail, Velta Addison, DO  nitroGLYCERIN (NITROSTAT) 0.4 MG SL tablet Place 1 tablet (0.4 mg total) under the tongue every 5 (five) minutes x 3 doses as needed for chest pain. 03/29/19   Gerlene Fee, NP  OXYGEN Place 4 L/min into the nose continuous.     [provider]  predniSONE (DELTASONE) 20 MG tablet Take 40 mg (two tablets) daily for 5 days 05/05/19   Margaretha Seeds, MD  simvastatin (ZOCOR) 20 MG tablet Take 20 mg by mouth daily.    [provider]  tamsulosin (FLOMAX) 0.4 MG CAPS capsule Take  1 capsule (0.4 mg total) by mouth at bedtime. 03/29/19   Gerlene Fee, NP  lamoTRIgine (LAMICTAL) 100 MG tablet Take 1 tablet (  100 mg total) by mouth 2 (two) times daily. 01/23/16 01/29/16  Kathrynn Ducking, MD    Allergies    Codeine and Trileptal [oxcarbazepine]  Review of Systems   Review of Systems  Unable to perform ROS: Acuity of condition  Constitutional: Positive for fatigue. Negative for fever.  HENT: Negative for sore throat.   Eyes: Negative for visual disturbance.  Respiratory: Positive for cough and shortness of breath.   Cardiovascular: Positive for chest pain.  Gastrointestinal: Negative for abdominal pain, nausea and vomiting.  Genitourinary: Negative for difficulty urinating.  Musculoskeletal: Negative for neck stiffness.  Skin: Negative for rash.  Neurological: Negative for syncope and headaches.    Physical Exam Updated Vital Signs BP (!) 148/78   Pulse (!) 104   Temp (!) 101.5 F (38.6 C) (Oral)   Resp 20   Ht 6' (1.829 m)   Wt 83 kg   SpO2 94%   BMI 24.82 kg/m   Physical Exam Vitals and nursing note reviewed.  Constitutional:      General: He is not in acute distress.    Appearance: He is well-developed. He is not diaphoretic.  HENT:     Head: Normocephalic and atraumatic.  Eyes:     Conjunctiva/sclera: Conjunctivae normal.  Cardiovascular:     Rate and Rhythm: Regular rhythm. Tachycardia present.     Heart sounds: Normal heart sounds. No murmur. No friction rub. No gallop.   Pulmonary:     Effort: Pulmonary effort is normal. No respiratory distress.     Breath sounds: Rales present. No wheezing.  Abdominal:     General: There is no distension.     Palpations: Abdomen is soft.     Tenderness: There is no abdominal tenderness. There is no guarding.  Musculoskeletal:     Cervical back: Normal range of motion.  Skin:    General: Skin is warm and dry.     Comments: Ulcers bilateral heels No surrounding erythema Unable to stage    Neurological:     Mental Status: He is alert and oriented to person, place, and time.     ED Results / Procedures / Treatments   Labs (all labs ordered are listed, but only abnormal results are displayed) Labs Reviewed  CBC WITH DIFFERENTIAL/PLATELET - Abnormal; Notable for the following components:      Result Value   WBC 12.0 (*)    RBC 3.07 (*)    Hemoglobin 8.6 (*)    HCT 28.2 (*)    RDW 17.2 (*)    Platelets 443 (*)    Neutro Abs 10.6 (*)    Lymphs Abs 0.3 (*)    Abs Immature Granulocytes 0.57 (*)    All other components within normal limits  COMPREHENSIVE METABOLIC PANEL - Abnormal; Notable for the following components:   Chloride 96 (*)    Glucose, Bld 114 (*)    Calcium 8.8 (*)    Albumin 2.4 (*)    All other components within normal limits  D-DIMER, QUANTITATIVE (NOT AT [facility]) - Abnormal; Notable for the following components:   D-Dimer, Quant 2.03 (*)    All other components within normal limits  FIBRINOGEN - Abnormal; Notable for the following components:   Fibrinogen 591 (*)    All other components within normal limits  C-REACTIVE PROTEIN - Abnormal; Notable for the following components:   CRP 11.8 (*)    All other components within normal limits  POC SARS CORONAVIRUS 2 AG -  ED - Abnormal;  Notable for the following components:   SARS Coronavirus 2 Ag POSITIVE (*)    All other components within normal limits  CULTURE, BLOOD (ROUTINE X 2)  CULTURE, BLOOD (ROUTINE X 2)  LACTIC ACID, PLASMA  LACTIC ACID, PLASMA  PROCALCITONIN  LACTATE DEHYDROGENASE  FERRITIN  TRIGLYCERIDES  CBC WITH DIFFERENTIAL/PLATELET  COMPREHENSIVE METABOLIC PANEL  C-REACTIVE PROTEIN  D-DIMER, QUANTITATIVE (NOT AT [facility])  FERRITIN  MAGNESIUM  PHOSPHORUS    EKG EKG Interpretation  Date/Time:  Saturday May 14 2019 17:19:40 EST Ventricular Rate:  115 PR Interval:  172 QRS Duration: 114 QT Interval:  324 QTC Calculation: 448 R Axis:   42 Text Interpretation: Sinus  tachycardia Right bundle branch block Abnormal ECG Since prior ECG< pt now appears to be in sinus rhythm Confirmed by Gareth Morgan [phone]) on 05/14/2019 9:43:28 PM   Radiology DG Chest Port 1 View  Result Date: 05/14/2019 CLINICAL DATA:  Shortness of breath in a patient who is COVID-19 positive. EXAM: PORTABLE CHEST 1 VIEW COMPARISON:  Single-view of the chest 04/13/2019. FINDINGS: Extensive, patchy bilateral airspace disease has progressed since the prior examination. Calcified pleural plaques and pleural thickening in the right chest are again seen. Heart size is enlarged. Aortic atherosclerosis. IMPRESSION: Worsening of patchy bilateral airspace disease most consistent with pneumonia. Calcified pleural plaques and pleural scarring along the lateral right chest wall. Cardiomegaly. Atherosclerosis. Electronically Signed   By: Inge Rise M.D.   On: 05/14/2019 17:42    Procedures .Critical Care Performed by: Gareth Morgan, MD Authorized by: Gareth Morgan, MD   Critical care provider statement:    Critical care time (minutes):  30   Critical care was time spent personally by me on the following activities:  Evaluation of patient's response to treatment, examination of patient, ordering and performing treatments and interventions, ordering and review of laboratory studies, ordering and review of radiographic studies, pulse oximetry, re-evaluation of patient's condition, obtaining history from patient or surrogate and review of old charts   (including critical care time)  Medications Ordered in ED Medications  acetaminophen (TYLENOL) tablet 650 mg (has no administration in time range)  aspirin EC tablet 81 mg (has no administration in time range)  amLODipine (NORVASC) tablet 2.5 mg (has no administration in time range)  metoprolol tartrate (LOPRESSOR) tablet 12.5 mg (has no administration in time range)  nitroGLYCERIN (NITROSTAT) SL tablet 0.4 mg (has no administration in time  range)  simvastatin (ZOCOR) tablet 20 mg (has no administration in time range)  ALPRAZolam (XANAX) tablet 1 mg (has no administration in time range)  amitriptyline (ELAVIL) tablet 100 mg (has no administration in time range)  bisacodyl (DULCOLAX) EC tablet 5 mg (has no administration in time range)  saccharomyces boulardii (FLORASTOR) capsule 250 mg (has no administration in time range)  docusate sodium (COLACE) capsule 100 mg (has no administration in time range)  finasteride (PROSCAR) tablet 5 mg (has no administration in time range)  tamsulosin (FLOMAX) capsule 0.4 mg (has no administration in time range)  enoxaparin (LOVENOX) injection 40 mg (has no administration in time range)  feeding supplement (PRO-STAT SUGAR FREE 64) liquid 30 mL (has no administration in time range)  multivitamin with minerals tablet 1 tablet (has no administration in time range)  Ipratropium-Albuterol (COMBIVENT) respimat 1 puff (has no administration in time range)  fluticasone furoate-vilanterol (BREO ELLIPTA) 200-25 MCG/INH 1 puff (has no administration in time range)  Fluticasone-Umeclidin-Vilant 200-62.5-25 MCG/INH AEPB 1 puff (has no administration in time range)  guaiFENesin (MUCINEX) 12 hr  tablet 600 mg (has no administration in time range)  camphor-menthol (SARNA) lotion 1 application (has no administration in time range)  collagenase (SANTYL) ointment (has no administration in time range)  collagenase (SANTYL) ointment 1 application (has no administration in time range)  latanoprost (XALATAN) 0.005 % ophthalmic solution 1 drop (has no administration in time range)  remdesivir 200 mg in sodium chloride 0.9% 250 mL IVPB (has no administration in time range)    Followed by  remdesivir 100 mg in sodium chloride 0.9 % 100 mL IVPB (has no administration in time range)  dexamethasone (DECADRON) tablet 6 mg (has no administration in time range)  guaiFENesin-dextromethorphan (ROBITUSSIN DM) 100-10 MG/5ML syrup 10  mL (has no administration in time range)  acetaminophen (TYLENOL) tablet 650 mg (has no administration in time range)  ascorbic acid (VITAMIN C) tablet 500 mg (has no administration in time range)  zinc sulfate capsule 220 mg (has no administration in time range)  gabapentin (NEURONTIN) capsule 800 mg (has no administration in time range)  dexamethasone (DECADRON) injection 10 mg (10 mg Intravenous Given 05/14/19 1730)  acetaminophen (TYLENOL) tablet 1,000 mg (1,000 mg Oral Given 05/14/19 1729)    ED Course  I have reviewed the triage vital signs and the nursing notes.  Pertinent labs & imaging results that were available during my care of the patient were reviewed by me and considered in my medical decision making (see chart for details).    MDM Rules/Calculators/A&P                      74yo male with history of chronic hypoxemic respiratory failure asbestosis and COPD on 5L of O2 chronically, chronic diastolic heart failure, CAD, htn, hlpd, AAA, recent COVID 19 positive test at his facility 2 days ago presents with concern for shortness of breath and hypoxia.  Hypoxic down to 50s at facility on 5L pNC with EMS, was placed on nonrebreather with improvement.  Had immediate desaturation on 6L on arrival to ED, placed on high flow O2 with rate 32L/min and improvement in oxygenation.  Presentation consistent with hypoxia secondary to COVID 19 infection in setting of chronic respiratory failure.  Pneumonia on CXR likely related to COVID, doubt bacterial infection at thsi time.  DDimer 2.03 suspect secondary to COVID 19 infection.  Not having significant wheezing or WOB or signs of COPD exacerbation at this time.  Given decadron, consulted hospitalist for admission to [facility].  On my history with patient he is alert, oriented, states he does NOT want intubation, is DNR/DNI.  Final Clinical Impression(s) / ED Diagnoses Final diagnoses:  COVID-19  Hypoxia    Rx / DC Orders ED Discharge Orders      None       Gareth Morgan, MD 05/14/19 2150

## 2019-05-15 ENCOUNTER — Other Ambulatory Visit: Payer: Self-pay

## 2019-05-15 DIAGNOSIS — L89154 Pressure ulcer of sacral region, stage 4: Secondary | ICD-10-CM

## 2019-05-15 DIAGNOSIS — I739 Peripheral vascular disease, unspecified: Secondary | ICD-10-CM

## 2019-05-15 LAB — COMPREHENSIVE METABOLIC PANEL
ALT: 13 U/L (ref 0–44)
AST: 17 U/L (ref 15–41)
Albumin: 2.7 g/dL — ABNORMAL LOW (ref 3.5–5.0)
Alkaline Phosphatase: 74 U/L (ref 38–126)
Anion gap: 11 (ref 5–15)
BUN: 19 mg/dL (ref 8–23)
CO2: 31 mmol/L (ref 22–32)
Calcium: 9 mg/dL (ref 8.9–10.3)
Chloride: 95 mmol/L — ABNORMAL LOW (ref 98–111)
Creatinine, Ser: 0.78 mg/dL (ref 0.61–1.24)
GFR calc Af Amer: >60 mL/min (ref >60)
GFR calc non Af Amer: >60 mL/min (ref >60)
Glucose, Bld: 154 mg/dL — ABNORMAL HIGH (ref 70–99)
Potassium: 4.8 mmol/L (ref 3.5–5.1)
Sodium: 137 mmol/L (ref 135–145)
Total Bilirubin: 0.7 mg/dL (ref 0.3–1.2)
Total Protein: 7 g/dL (ref 6.5–8.1)

## 2019-05-15 LAB — MAGNESIUM: Magnesium: 1.9 mg/dL (ref 1.7–2.4)

## 2019-05-15 LAB — CBC WITH DIFFERENTIAL/PLATELET
Abs Immature Granulocytes: 0.35 10*3/uL — ABNORMAL HIGH (ref 0.00–0.07)
Basophils Absolute: 0 10*3/uL (ref 0.0–0.1)
Basophils Relative: 0 %
Eosinophils Absolute: 0 10*3/uL (ref 0.0–0.5)
Eosinophils Relative: 0 %
HCT: 28.6 % — ABNORMAL LOW (ref 39.0–52.0)
Hemoglobin: 8.4 g/dL — ABNORMAL LOW (ref 13.0–17.0)
Immature Granulocytes: 4 %
Lymphocytes Relative: 4 %
Lymphs Abs: 0.3 10*3/uL — ABNORMAL LOW (ref 0.7–4.0)
MCH: 26.9 pg (ref 26.0–34.0)
MCHC: 29.4 g/dL — ABNORMAL LOW (ref 30.0–36.0)
MCV: 91.7 fL (ref 80.0–100.0)
Monocytes Absolute: 0.2 10*3/uL (ref 0.1–1.0)
Monocytes Relative: 3 %
Neutro Abs: 7.3 10*3/uL (ref 1.7–7.7)
Neutrophils Relative %: 89 %
Platelets: 440 10*3/uL — ABNORMAL HIGH (ref 150–400)
RBC: 3.12 MIL/uL — ABNORMAL LOW (ref 4.22–5.81)
RDW: 17.5 % — ABNORMAL HIGH (ref 11.5–15.5)
WBC: 8.3 10*3/uL (ref 4.0–10.5)
nRBC: 0 % (ref 0.0–0.2)

## 2019-05-15 LAB — FERRITIN: Ferritin: 297 ng/mL (ref 24–336)

## 2019-05-15 LAB — SAMPLE TO BLOOD BANK

## 2019-05-15 LAB — C-REACTIVE PROTEIN: CRP: 16.2 mg/dL — ABNORMAL HIGH (ref <1.0)

## 2019-05-15 LAB — ABO/RH: ABO/RH(D): A POS

## 2019-05-15 LAB — D-DIMER, QUANTITATIVE: D-Dimer, Quant: 1.39 ug/mL-FEU — ABNORMAL HIGH (ref 0.00–0.50)

## 2019-05-15 LAB — PHOSPHORUS: Phosphorus: 4.6 mg/dL (ref 2.5–4.6)

## 2019-05-15 MED ORDER — SODIUM CHLORIDE 0.9 % IV SOLN
100.0000 mg | INTRAVENOUS | Status: AC
  Administered 2019-05-15 – 2019-05-18 (×4): 100 mg via INTRAVENOUS
  Filled 2019-05-15 (×4): qty 20

## 2019-05-15 MED ORDER — PANTOPRAZOLE SODIUM 40 MG PO TBEC
40.0000 mg | DELAYED_RELEASE_TABLET | Freq: Every day | ORAL | Status: DC
  Administered 2019-05-15 – 2019-05-20 (×6): 40 mg via ORAL
  Filled 2019-05-15 (×6): qty 1

## 2019-05-15 MED ORDER — MELATONIN 3 MG PO TABS
6.0000 mg | ORAL_TABLET | Freq: Every day | ORAL | Status: DC
  Administered 2019-05-15 – 2019-05-19 (×5): 6 mg via ORAL
  Filled 2019-05-15 (×7): qty 2

## 2019-05-15 MED ORDER — DEXAMETHASONE SODIUM PHOSPHATE 10 MG/ML IJ SOLN
6.0000 mg | Freq: Two times a day (BID) | INTRAMUSCULAR | Status: DC
  Administered 2019-05-15 – 2019-05-18 (×6): 6 mg via INTRAVENOUS
  Filled 2019-05-15 (×6): qty 1

## 2019-05-15 MED ORDER — GABAPENTIN 300 MG PO CAPS
700.0000 mg | ORAL_CAPSULE | Freq: Three times a day (TID) | ORAL | Status: DC
  Administered 2019-05-15 – 2019-05-20 (×17): 700 mg via ORAL
  Filled 2019-05-15 (×18): qty 2

## 2019-05-15 MED ORDER — DAKINS (1/4 STRENGTH) 0.125 % EX SOLN
Freq: Two times a day (BID) | CUTANEOUS | Status: AC
  Administered 2019-05-15: 2 via TOPICAL
  Administered 2019-05-17: 1 via TOPICAL
  Filled 2019-05-15: qty 473

## 2019-05-15 MED ORDER — VITAMIN D 25 MCG (1000 UNIT) PO TABS
2000.0000 [IU] | ORAL_TABLET | Freq: Every day | ORAL | Status: DC
  Administered 2019-05-15 – 2019-05-20 (×6): 2000 [IU] via ORAL
  Filled 2019-05-15 (×6): qty 2

## 2019-05-15 MED ORDER — DAKINS (1/4 STRENGTH) 0.125 % EX SOLN
Freq: Two times a day (BID) | CUTANEOUS | Status: DC
  Administered 2019-05-19: 1 via TOPICAL
  Filled 2019-05-15 (×2): qty 473

## 2019-05-15 MED ORDER — FLUTICASONE PROPIONATE 50 MCG/ACT NA SUSP
1.0000 | Freq: Every day | NASAL | Status: DC
  Administered 2019-05-15 – 2019-05-20 (×6): 1 via NASAL
  Filled 2019-05-15: qty 16

## 2019-05-15 MED ORDER — B COMPLEX-C PO TABS
1.0000 | ORAL_TABLET | Freq: Every day | ORAL | Status: DC
  Administered 2019-05-15 – 2019-05-20 (×6): 1 via ORAL
  Filled 2019-05-15 (×7): qty 1

## 2019-05-15 MED ORDER — SODIUM CHLORIDE 0.9% IV SOLUTION: Freq: Once | INTRAVENOUS | Status: AC

## 2019-05-15 MED ORDER — ALPRAZOLAM 0.5 MG PO TABS
1.0000 mg | ORAL_TABLET | Freq: Three times a day (TID) | ORAL | Status: DC | PRN
  Administered 2019-05-15 – 2019-05-20 (×11): 1 mg via ORAL
  Filled 2019-05-15 (×12): qty 2

## 2019-05-15 MED ORDER — SODIUM CHLORIDE 0.9 % IV SOLN: 100.0000 mg | INTRAVENOUS | Status: DC

## 2019-05-15 NOTE — ED Notes (Signed)
[facility] on the way--Curtis [name]

## 2019-05-15 NOTE — Plan of Care (Signed)
POC discussed with patient. Wishes to remain a DNR. Alert and oriented x3 reorientation provided. No c/o pain. In NSR, BP stable, afebrile, on 10L HFNC, tolerating well, non productive cough. Voiding in urinal,  bo BM this shift. Multiple wounds and DTI. See LDA avatar. Wound consult recommended. Turned left/right. 2 RN skin assessment completed upon admission.

## 2019-05-15 NOTE — Consult Note (Signed)
[name] Nurse Consult Note: Patient receiving care in [place]; patient is COVID +. Consult completed remotely after review of record and phone conversation with primary RN Ashok Croon [phone]).  Curtis [name] reports she was unable to obtain photos of areas as ROVER app on phone is not functioning correctly. Reason for Consult: "sacral and L heel pressure ulcer" Wound type: stage 4 to sacrum; unstageable to bilateral heels Pressure Injury POA: Yes Measurement: To be provided by the bedside RN in the flowsheet section Wound bed: Curtis [name] described the left heel wound as follows:  Black on the posterior heel with yellow and brown stringy material along the achilles tendon area.  It is malodorous.  The right heel has an area of stable black eschar.  The sacrum is a stage 4 with heavy amounts of yellow in the wound bed. Drainage (amount, consistency, odor)  Periwound: Dressing procedure/placement/frequency:  Place gauze moistened with Dakin's solution into the sacral wound and over the LEFT heel and achilles wound. Cover with dry gauze, tape in place. Iodine to right heel each shift. Bilateral Prevalon boots. Turn patient avoid placing on the sacrum. Monitor the wound area(s) for worsening of condition such as: Signs/symptoms of infection,  Increase in size,  Development of or worsening of odor, Development of pain, or increased pain at the affected locations.  Notify the medical team if any of these develop.  Thank you for the consult.  Discussed plan of care with the bedside nurse.  [place] nurse will not follow at this time.  Please re-consult the [facility] team if needed.  Val Riles, RN, MSN, CWOCN, CNS-BC, pager [phone]

## 2019-05-15 NOTE — Progress Notes (Signed)
PROGRESS NOTE  Curtis [name] [id number] DOB: 1945/08/24 DOA: 05/14/2019  PCP: [name], MD  Brief History/Interval Summary: 74 year old Caucasian male with a past medical history of pulmonary asbestosis, coronary artery disease status post PCI x2, history of AVNRT status post ablation, history of COPD, chronic respiratory failure with hypoxia on 4 L of oxygen at home, essential hypertension, hyperlipidemia, GERD, peripheral artery disease who is currently in skilled nursing facility after recent surgery for hip fracture.  He presented with shortness of breath.  Found to have positive COVID-19 test result.  Chest x-ray showed bilateral opacities.  He was hospitalized for further management.   Reason for Visit: Pneumonia due to COVID-19.  Acute respiratory failure with hypoxia  Consultants: None  Procedures: None  Antibiotics: Anti-infectives (From admission, onward)   Start     Dose/Rate Route Frequency Ordered Stop   05/16/19 1000  remdesivir 100 mg in sodium chloride 0.9 % 100 mL IVPB  Status:  Discontinued     100 mg 200 mL/hr over 30 Minutes Intravenous Every 24 hours 05/15/19 0229 05/15/19 1341   05/15/19 2000  remdesivir 100 mg in sodium chloride 0.9 % 100 mL IVPB  Status:  Discontinued     100 mg 200 mL/hr over 30 Minutes Intravenous Daily 05/14/19 2039 05/15/19 0229   05/15/19 1800  remdesivir 100 mg in sodium chloride 0.9 % 100 mL IVPB     100 mg 200 mL/hr over 30 Minutes Intravenous Every 24 hours 05/15/19 1341 05/19/19 1759   05/14/19 2045  remdesivir 200 mg in sodium chloride 0.9% 250 mL IVPB     200 mg 580 mL/hr over 30 Minutes Intravenous Once 05/14/19 2039 05/15/19 0128      Subjective/Interval History: Patient mentions that he is having difficulty breathing but better than yesterday.  Dry cough.  No chest pain.  He has wounds on both his heels which have been ongoing for 4 to 5 months.  Denies any nausea vomiting.    Assessment/Plan:  Acute Hypoxic  Resp. Failure/Pneumonia due to COVID-19   Recent Labs  Lab 05/14/19 1719 05/15/19 0255  DDIMER 2.03* 1.39*  FERRITIN 288 297  CRP 11.8* 16.2*  ALT 13 13  PROCALCITON <0.10  --     Objective findings: Fever: Pitcher was 101.5 F yesterday.  Afebrile since then. Oxygen requirements: HFNC.  Noted to be on 10 L this morning.  Weaned down to 5.  Saturating in the mid 90s.  COVID 19 Therapeutics: Antibacterials: None Remdesivir: Day 2 Steroids: Dexamethasone 6 mg daily Diuretics: Not given yet Actemra: Not given yet Convalescent Plasma: 1 unit has been ordered Vitamin C and Zinc: Continue PUD Prophylaxis: On Protonix DVT Prophylaxis:  Lovenox   Patient noted to be on 10 L high flow nasal cannula this morning.  It looks like he has been weaned down to 5 L.  He seems to be stable from a respiratory standpoint.  His inflammatory markers however are still very elevated.  We will increase the dose of his steroids.  Convalescent plasma was discussed with the patient.  He was told about the experimental nature of this therapy.  He was given the consent forms for the same.  He read through them.  He is interested.  He has signed the consent form.  1 unit has been ordered.  Procalcitonin less than 0.1.  No indication for antibacterials at this time.  WBC was elevated yesterday but noted to be normal this morning.  Incentive spirometry, mobilization, prone  positioning as much as possible.  Continue to trend inflammatory markers.  History of COPD/chronic respiratory failure with hypoxia on home oxygen Patient uses 4 L at baseline.  He is currently in a skilled nursing facility after he sustained left hip fracture few weeks ago.  Continue with his inhalers.  Otherwise see above.  Followed by Dr. [name] with pulmonology.  History of coronary artery disease status post PCI/history of AVNRT status post ablation/history of atrial flutter Stable.  Followed by Dr. [name] with cardiology.  Currently not  on anticoagulation.  He is on aspirin statin and beta-blocker.  Normocytic anemia Apparently at baseline.  No evidence of overt bleeding.  Continue to monitor.  Transfuse if it drops below 7.  Peripheral artery disease Stable.  Continue to monitor.  He is noted to have ulcers in both his heels.  No recent arterial Doppler study.  This will be ordered.  Lower extremity wounds involving the bilateral heels left more than right, present on admission Wound care consult has been requested.  These are likely pressure injuries but patient does have a history of PAD and so that should also be considered as possible etiology.  Stage IV sacral decubitus, present on admission Wound care to follow.  Essential hypertension Continue amlodipine and metoprolol.  Hyperlipidemia Continue simvastatin.  History of glaucoma Continue eyedrops.  History of BPH Continue finasteride and Flomax.  Peripheral neuropathy Continue gabapentin  Pressure injuries, present on admission Pressure Injury 02/09/19 Heel Right Unstageable - Full thickness tissue loss in which the base of the ulcer is covered by slough (yellow, tan, gray, green or brown) and/or eschar (tan, brown or black) in the wound bed. Rule nurse updated, this wound is un (Active)  02/09/19 1015  Location: Heel  Location Orientation: Right  Staging: Unstageable - Full thickness tissue loss in which the base of the ulcer is covered by slough (yellow, tan, gray, green or brown) and/or eschar (tan, brown or black) in the wound bed.  Wound Description (Comments): WOC nurse updated, this wound is unstageable  Present on Admission: Yes     Pressure Injury 02/09/19 Sacrum Medial Stage II -  Partial thickness loss of dermis presenting as a shallow open ulcer with a red, pink wound bed without slough. 4cm x 5cm center area wound with dark red wound base, periwound area with reddness and sloug (Active)  02/09/19 1015  Location: Sacrum  Location  Orientation: Medial  Staging: Stage II -  Partial thickness loss of dermis presenting as a shallow open ulcer with a red, pink wound bed without slough.  Wound Description (Comments): 4cm x 5cm center area wound with dark red wound base, periwound area with reddness and sloughing of skin noted  Present on Admission: Yes     Pressure Injury 04/13/19 Heel Left Stage III -  Full thickness tissue loss. Subcutaneous fat may be visible but bone, tendon or muscle are NOT exposed. (Active)  04/13/19 2000  Location: Heel  Location Orientation: Left  Staging: Stage III -  Full thickness tissue loss. Subcutaneous fat may be visible but bone, tendon or muscle are NOT exposed.  Wound Description (Comments):   Present on Admission: Yes     Pressure Injury 04/13/19 Heel Right Stage II -  Partial thickness loss of dermis presenting as a shallow open ulcer with a red, pink wound bed without slough. (Active)  04/13/19 2000  Location: Heel  Location Orientation: Right  Staging: Stage II -  Partial thickness loss of dermis presenting as  a shallow open ulcer with a red, pink wound bed without slough.  Wound Description (Comments):   Present on Admission: Yes     Pressure Injury 04/13/19 Sacrum Unstageable - Full thickness tissue loss in which the base of the ulcer is covered by slough (yellow, tan, gray, green or brown) and/or eschar (tan, brown or black) in the wound bed. (Active)  04/13/19 2000  Location: Sacrum  Location Orientation:   Staging: Unstageable - Full thickness tissue loss in which the base of the ulcer is covered by slough (yellow, tan, gray, green or brown) and/or eschar (tan, brown or black) in the wound bed.  Wound Description (Comments):   Present on Admission: Yes    DVT Prophylaxis: Lovenox Code Status: DNR Family Communication: Has a son.  We will try to contact. Disposition Plan: From a skilled nursing facility.  Should be able to return there when improved.   Medications:    Scheduled: . sodium chloride   Intravenous Once  . acetaminophen  650 mg Oral TID  . amitriptyline  100 mg Oral QHS  . amLODipine  2.5 mg Oral Daily  . vitamin C  500 mg Oral Daily  . aspirin EC  81 mg Oral Daily  . B-complex with vitamin C  1 tablet Oral Daily  . cholecalciferol  2,000 Units Oral Daily  . collagenase  1 application Topical Daily  . dexamethasone  6 mg Oral Q24H  . enoxaparin  40 mg Subcutaneous Q24H  . feeding supplement (PRO-STAT SUGAR FREE 64)  30 mL Oral BID BM  . finasteride  5 mg Oral Daily  . fluticasone  1 spray Each Nare Daily  . fluticasone furoate-vilanterol  1 puff Inhalation Daily  . gabapentin  700 mg Oral TID  . guaiFENesin  600 mg Oral BID  . Ipratropium-Albuterol  1 puff Inhalation Q6H  . latanoprost  1 drop Both Eyes QHS  . Melatonin  6 mg Oral QHS  . metoprolol tartrate  12.5 mg Oral BID  . multivitamin with minerals  1 tablet Oral Daily  . pantoprazole  40 mg Oral Daily  . saccharomyces boulardii  250 mg Oral Daily  . simvastatin  20 mg Oral Daily  . sodium hypochlorite   Topical BID  . [START ON 05/18/2019] sodium hypochlorite   Topical BID  . tamsulosin  0.4 mg Oral QHS  . umeclidinium bromide  1 puff Inhalation Daily  . zinc sulfate  220 mg Oral Daily   Continuous: . remdesivir 100 mg in NS 100 mL     [id number], ALPRAZolam, bisacodyl, camphor-menthol, docusate sodium, guaiFENesin-dextromethorphan, nitroGLYCERIN   Objective:  Vital Signs  Vitals:   05/15/19 0300 05/15/19 0829 05/15/19 1030 05/15/19 1200  BP: 140/78 (!) 145/78  124/63  Pulse: 78 79  76  Resp:    (!) 22  Temp: 98.8 F (37.1 C) 98.4 F (36.9 C)  98.8 F (37.1 C)  TempSrc: Oral Oral  Oral  SpO2: 100% 100% 97% 98%  Weight: 83.2 kg     Height:        Intake/Output Summary (Last 24 hours) at 05/15/2019 1345 Last data filed at 05/15/2019 1130 Gross per 24 hour  Intake 613.22 ml  Output 800 ml  Net -186.78 ml   Filed Weights   05/14/19 1712 05/15/19  0300  Weight: 83 kg 83.2 kg    General appearance: Awake alert.  In no distress Resp: Mildly tachypneic at rest.  Crackles bilateral bases.  No wheezing or rhonchi  Cardio: S1-S2 is normal regular.  No S3-S4.  No rubs murmurs or bruit GI: Abdomen is soft.  Nontender nondistended.  Bowel sounds are present normal.  No masses organomegaly Extremities: Mild edema bilateral lower extremities.  Foul-smelling wound present in the left heel.  Blastic eschar noted.  Wound also noted in the left heel.  Sacral decubiti. Neurologic: Alert and oriented x3.  No focal neurological deficits.  Very deconditioned.   Lab Results:  Data Reviewed: I have personally reviewed following labs and imaging studies  CBC: Recent Labs  Lab 05/14/19 1719 05/15/19 0255  WBC 12.0* 8.3  NEUTROABS 10.6* 7.3  HGB 8.6* 8.4*  HCT 28.2* 28.6*  MCV 91.9 91.7  PLT 443* 440*    Basic Metabolic Panel: Recent Labs  Lab 05/14/19 1719 05/15/19 0255  NA 136 137  K 4.4 4.8  CL 96* 95*  CO2 31 31  GLUCOSE 114* 154*  BUN 16 19  CREATININE 0.84 0.78  CALCIUM 8.8* 9.0  MG  --  1.9  PHOS  --  4.6    GFR: Estimated Creatinine Clearance: 88.9 mL/min (by C-G formula based on SCr of 0.78 mg/dL).  Liver Function Tests: Recent Labs  Lab 05/14/19 1719 05/15/19 0255  AST 18 17  ALT 13 13  ALKPHOS 74 74  BILITOT 0.7 0.7  PROT 6.5 7.0  ALBUMIN 2.4* 2.7*    Lipid Profile: Recent Labs    05/14/19 1719  TRIG 113    Anemia Panel: Recent Labs    05/14/19 1719 05/15/19 0255  FERRITIN 288 297    Recent Results (from the past 240 hour(s))  Blood Culture (routine x 2)     Status: None (Preliminary result)   Collection Time: 05/14/19  8:15 PM   Specimen: BLOOD  Result Value Ref Range Status   Specimen Description BLOOD RIGHT ANTECUBITAL  Final   Special Requests   Final    BOTTLES DRAWN AEROBIC AND ANAEROBIC Blood Culture adequate volume   Culture   Final    NO GROWTH < 24 HOURS Performed at [facility], [place] [address] [place], [place] [address]    Report Status PENDING  Incomplete      Radiology Studies: DG Chest Port 1 View  Result Date: 05/14/2019 CLINICAL DATA:  Shortness of breath in a patient who is COVID-19 positive. EXAM: PORTABLE CHEST 1 VIEW COMPARISON:  Single-view of the chest 04/13/2019. FINDINGS: Extensive, patchy bilateral airspace disease has progressed since the prior examination. Calcified pleural plaques and pleural thickening in the right chest are again seen. Heart size is enlarged. Aortic atherosclerosis. IMPRESSION: Worsening of patchy bilateral airspace disease most consistent with pneumonia. Calcified pleural plaques and pleural scarring along the lateral right chest wall. Cardiomegaly. Atherosclerosis. Electronically Signed   By: Inge Rise M.D.   On: 05/14/2019 17:42       LOS: 1 day   [facility] Pager on [email or web address]  05/15/2019, 1:45 PM

## 2019-05-15 NOTE — Progress Notes (Signed)
[name] is a 74yo male admitted today from [facility] after coming from a SNF s/p left hip pinning, covid +. He has 3 surgical incision sites with dermabond to his left hip which are healed, but in stark contrast, he has 2 other concerning deep tissue wounds. His left heel has black eschar and then open at achilles region with black/brown tissue that is sloughing and odorous at a stage 4. He also has a stage 4 sacral wound to the bone with pink tissue. I consulted with wound care today and we planned for dakins solution to the left heel and sacrum with wound care two times per day. His dressings were performed at 8AM then again at 11AM once the dakins solution arrived. He was on 12L NRB this am and I switched him to 5L HFNC because his SPO2 was 100%. Per report his home O2 baseline is 4L via . He is saturating at 98% on 5L HFNC. Today he is planned for receiving plasma. He will also continue his Remdesivir dose 2/5 today. [name] told me he is very anxious and I conveyed to Dr. Nicki Guadalajara that he takes xanax 3 times per day. The medication was increased to 3 times daily as needed. [name] was informed of the change to his medication and was in agreement with the plan for today.

## 2019-05-16 ENCOUNTER — Encounter (HOSPITAL_COMMUNITY): Payer: Medicare Other

## 2019-05-16 DIAGNOSIS — J9621 Acute and chronic respiratory failure with hypoxia: Secondary | ICD-10-CM

## 2019-05-16 DIAGNOSIS — D649 Anemia, unspecified: Secondary | ICD-10-CM

## 2019-05-16 LAB — CBC WITH DIFFERENTIAL/PLATELET
Abs Immature Granulocytes: 0.15 10*3/uL — ABNORMAL HIGH (ref 0.00–0.07)
Basophils Absolute: 0 10*3/uL (ref 0.0–0.1)
Basophils Relative: 0 %
Eosinophils Absolute: 0 10*3/uL (ref 0.0–0.5)
Eosinophils Relative: 0 %
HCT: 27.9 % — ABNORMAL LOW (ref 39.0–52.0)
Hemoglobin: 8.4 g/dL — ABNORMAL LOW (ref 13.0–17.0)
Immature Granulocytes: 2 %
Lymphocytes Relative: 4 %
Lymphs Abs: 0.4 10*3/uL — ABNORMAL LOW (ref 0.7–4.0)
MCH: 27.5 pg (ref 26.0–34.0)
MCHC: 30.1 g/dL (ref 30.0–36.0)
MCV: 91.5 fL (ref 80.0–100.0)
Monocytes Absolute: 0.4 10*3/uL (ref 0.1–1.0)
Monocytes Relative: 4 %
Neutro Abs: 8.4 10*3/uL — ABNORMAL HIGH (ref 1.7–7.7)
Neutrophils Relative %: 90 %
Platelets: 459 10*3/uL — ABNORMAL HIGH (ref 150–400)
RBC: 3.05 MIL/uL — ABNORMAL LOW (ref 4.22–5.81)
RDW: 17.2 % — ABNORMAL HIGH (ref 11.5–15.5)
WBC: 9.3 10*3/uL (ref 4.0–10.5)
nRBC: 0 % (ref 0.0–0.2)

## 2019-05-16 LAB — COMPREHENSIVE METABOLIC PANEL
ALT: 11 U/L (ref 0–44)
AST: 14 U/L — ABNORMAL LOW (ref 15–41)
Albumin: 2.5 g/dL — ABNORMAL LOW (ref 3.5–5.0)
Alkaline Phosphatase: 65 U/L (ref 38–126)
Anion gap: 10 (ref 5–15)
BUN: 32 mg/dL — ABNORMAL HIGH (ref 8–23)
CO2: 32 mmol/L (ref 22–32)
Calcium: 9.1 mg/dL (ref 8.9–10.3)
Chloride: 95 mmol/L — ABNORMAL LOW (ref 98–111)
Creatinine, Ser: 0.93 mg/dL (ref 0.61–1.24)
GFR calc Af Amer: >60 mL/min (ref >60)
GFR calc non Af Amer: >60 mL/min (ref >60)
Glucose, Bld: 173 mg/dL — ABNORMAL HIGH (ref 70–99)
Potassium: 4.8 mmol/L (ref 3.5–5.1)
Sodium: 137 mmol/L (ref 135–145)
Total Bilirubin: 0.3 mg/dL (ref 0.3–1.2)
Total Protein: 6.5 g/dL (ref 6.5–8.1)

## 2019-05-16 LAB — BPAM FFP
Blood Product Expiration Date: 202101111144
ISSUE DATE / TIME: 202101101148
Unit Type and Rh: 6200

## 2019-05-16 LAB — D-DIMER, QUANTITATIVE: D-Dimer, Quant: 1.02 ug/mL-FEU — ABNORMAL HIGH (ref 0.00–0.50)

## 2019-05-16 LAB — PREPARE FRESH FROZEN PLASMA

## 2019-05-16 LAB — MAGNESIUM: Magnesium: 2 mg/dL (ref 1.7–2.4)

## 2019-05-16 LAB — C-REACTIVE PROTEIN: CRP: 10.8 mg/dL — ABNORMAL HIGH (ref <1.0)

## 2019-05-16 LAB — FERRITIN: Ferritin: 348 ng/mL — ABNORMAL HIGH (ref 24–336)

## 2019-05-16 MED ORDER — ENSURE ENLIVE PO LIQD
237.0000 mL | Freq: Three times a day (TID) | ORAL | Status: DC
  Administered 2019-05-19 – 2019-05-20 (×4): 237 mL via ORAL

## 2019-05-16 NOTE — Progress Notes (Signed)
Initial Nutrition Assessment RD working remotely.  DOCUMENTATION CODES:   Not applicable  INTERVENTION:    Ensure Enlive po TID, each supplement provides 350 kcal and 20 grams of protein  MVI daily.  Magic cup BID with meals, each supplement provides 290 kcal and 9 grams of protein.  NUTRITION DIAGNOSIS:   Increased nutrient needs related to acute illness, wound healing(COVID-19) as evidenced by estimated needs.  GOAL:   Patient will meet greater than or equal to 90% of their needs  MONITOR:   PO intake, Supplement acceptance, Labs, Weight trends  REASON FOR ASSESSMENT:   Malnutrition Screening Tool    ASSESSMENT:   74 yo male admitted from SNF with SOB, positive COVID-19 test. PMH includes pulmonary asbestosis, CAD, COPD, chronic respiratory failure with hypoxia (4L oxygen at home) GERD, depression, diverticulitis, HTN, IBS, PVD, recent hip fracture.   Patient is on a dysphagia 3 diet with thin liquids, consuming 0-100% of meals since admission. Pro-stat was ordered, but patient only consumed one dose today, he refused the second dose.   Labs reviewed.  Medications reviewed and include vitamin C, vitamin D3, B-complex with vitamin C, decadron, MVI, florastor, flomax, zinc sulfate.  Weight encounters reviewed. Patient has lost 8% of usual weight within the past 2 months, which is significant for time frame.  Patient would benefit from PO supplements to ensure adequate intake to meet increased nutrition needs for COVID-19.  Suspect malnutrition; unable to obtain enough information at this time for identification of malnutrition.   NUTRITION - FOCUSED PHYSICAL EXAM:  unable to complete d/t COVID restrictions  Diet Order:   Diet Order            DIET DYS 3 Room service appropriate? Yes; Fluid consistency: Thin  Diet effective now              EDUCATION NEEDS:   Not appropriate for education at this time  Skin:  Skin Assessment: Skin Integrity  Issues: Skin Integrity Issues:: Stage III, Unstageable Stage III: L heel Unstageable: R heel, sacrum  Last BM:  1/10  Height:   Ht Readings from Last 1 Encounters:  05/14/19 6' (1.829 m)    Weight:   Wt Readings from Last 1 Encounters:  05/15/19 83.2 kg    Ideal Body Weight:  80.9 kg  BMI:  Body mass index is 24.88 kg/m.  Estimated Nutritional Needs:   Kcal:  [id number]  Protein:  125-150 gm  Fluid:  >/= 2.3 L    Molli Barrows, RD, LDN, [place] Pager [phone] After Hours Pager [phone]

## 2019-05-16 NOTE — Plan of Care (Signed)
  Problem: Education: Goal: Knowledge of risk factors and measures for prevention of condition will improve Outcome: Progressing   Problem: Coping: Goal: Psychosocial and spiritual needs will be supported Outcome: Progressing   Problem: Respiratory: Goal: Will maintain a patent airway Outcome: Progressing Goal: Complications related to the disease process, condition or treatment will be avoided or minimized Outcome: Progressing   

## 2019-05-16 NOTE — Plan of Care (Signed)
RN updated pt's daughter-in-law, Herbert Spires. Pt stable. Wound dressings changed. Pt reports no sleep overnight and requested Xanax. VSS. O2 weened to 4L [place], baseline. No appetite, Ensure offered and pt declined. Will continue to monitor.  Problem: Education: Goal: Knowledge of risk factors and measures for prevention of condition will improve 05/16/2019 1223 by Burnetta Sabin, RN Outcome: Progressing 05/16/2019 1221 by Burnetta Sabin, RN Outcome: Progressing   Problem: Coping: Goal: Psychosocial and spiritual needs will be supported 05/16/2019 1223 by Burnetta Sabin, RN Outcome: Progressing 05/16/2019 1221 by Burnetta Sabin, RN Outcome: Progressing   Problem: Respiratory: Goal: Will maintain a patent airway 05/16/2019 1223 by Burnetta Sabin, RN Outcome: Progressing 05/16/2019 1221 by Burnetta Sabin, RN Outcome: Progressing Goal: Complications related to the disease process, condition or treatment will be avoided or minimized 05/16/2019 1223 by Burnetta Sabin, RN Outcome: Progressing 05/16/2019 1221 by Burnetta Sabin, RN Outcome: Progressing

## 2019-05-16 NOTE — Progress Notes (Signed)
PROGRESS NOTE  Curtis [name] [id number] DOB: August 19, 1945 DOA: 05/14/2019  PCP: [name], MD  Brief History/Interval Summary: 74 year old Caucasian male with a past medical history of pulmonary asbestosis, coronary artery disease status post PCI x2, history of AVNRT status post ablation, history of COPD, chronic respiratory failure with hypoxia on 4 L of oxygen at home, essential hypertension, hyperlipidemia, GERD, peripheral artery disease who is currently in skilled nursing facility after recent surgery for hip fracture.  He presented with shortness of breath.  Found to have positive COVID-19 test result.  Chest x-ray showed bilateral opacities.  He was hospitalized for further management.   Reason for Visit: Pneumonia due to COVID-19.  Acute respiratory failure with hypoxia  Consultants: None  Procedures: None  Antibiotics: Anti-infectives (From admission, onward)   Start     Dose/Rate Route Frequency Ordered Stop   05/16/19 1000  remdesivir 100 mg in sodium chloride 0.9 % 100 mL IVPB  Status:  Discontinued     100 mg 200 mL/hr over 30 Minutes Intravenous Every 24 hours 05/15/19 0229 05/15/19 1341   05/15/19 2000  remdesivir 100 mg in sodium chloride 0.9 % 100 mL IVPB  Status:  Discontinued     100 mg 200 mL/hr over 30 Minutes Intravenous Daily 05/14/19 2039 05/15/19 0229   05/15/19 1800  remdesivir 100 mg in sodium chloride 0.9 % 100 mL IVPB     100 mg 200 mL/hr over 30 Minutes Intravenous Every 24 hours 05/15/19 1341 05/19/19 1759   05/14/19 2045  remdesivir 200 mg in sodium chloride 0.9% 250 mL IVPB     200 mg 580 mL/hr over 30 Minutes Intravenous Once 05/14/19 2039 05/15/19 0128      Subjective/Interval History: Patient mentions that he is feeling slightly better.  Not as short of breath as before.  Denies any pain.      Assessment/Plan:  Acute Hypoxic Resp. Failure/Pneumonia due to COVID-19   Recent Labs  Lab 05/14/19 1719 05/15/19 0255 05/16/19 0330    DDIMER 2.03* 1.39* 1.02*  FERRITIN 288 297 348*  CRP 11.8* 16.2* 10.8*  ALT 13 13 11   PROCALCITON <0.10  --   --     Objective findings: Fever: Afebrile the last 24 hours.   Oxygen requirements: High flow nasal cannula.  5 L/min.  Saturating in the mid 90s.    COVID 19 Therapeutics: Antibacterials: None Remdesivir: Day 3 Steroids: Dexamethasone 6 mg daily Diuretics: Not given yet Actemra: Not given yet Convalescent Plasma: Transfused on 1/10 Vitamin C and Zinc: Continue PUD Prophylaxis: On Protonix DVT Prophylaxis:  Lovenox   Patient is not on 5 L of oxygen.  He saturating in the mid 90s.  Could be weaned down even further.  Seems to have improved from a respiratory standpoint.  His inflammatory markers are improving.  CRP is down to 10.8.  Patient did get convalescent plasma yesterday.  Since he is improving hold off on Actemra.  D-dimer is better as well.  Procalcitonin was less than 0.1.  Continue remdesivir and steroids.  Hold off on antibacterials.  He is from a skilled nursing facility and has very poor mobility at baseline.  PT and OT to work with him.  Incentive spirometer.  History of COPD/chronic respiratory failure with hypoxia on home oxygen Patient uses 4 L at baseline.  He is currently in a skilled nursing facility after he sustained left hip fracture few months ago.  Continue with his inhalers.  Otherwise see above.  Followed  by Dr. [name] with pulmonology.  History of coronary artery disease status post PCI/history of AVNRT status post ablation/history of atrial flutter Stable.  Followed by Dr. [name] with cardiology.  Currently not on anticoagulation.  He is on aspirin statin and beta-blocker.  Normocytic anemia Hemoglobin is at baseline.  No evidence for overt bleeding.  Continue to monitor.  Transfuse if it drops below 7.    Peripheral artery disease Stable.  Continue to monitor.  He is noted to have ulcers in both his heels.  Arterial Dopplers are pending.   However patient does have poor quality of life at baseline.  Unclear if he will be a candidate for any intervention.  He will need outpatient follow-up with vascular surgery if he is found to have significant disease.  Lower extremity wounds involving the bilateral heels left more than right, present on admission Wound care consult has been requested.  These are likely pressure injuries but patient does have a history of PAD and so that should also be considered as possible etiology.  Continue wound care.  Stage IV sacral decubitus, present on admission Wound care to follow.  Essential hypertension Continue amlodipine and metoprolol.  Hyperlipidemia Continue simvastatin.  History of glaucoma Continue eyedrops.  History of BPH Continue finasteride and Flomax.  Peripheral neuropathy Continue gabapentin  Pressure injuries, present on admission Pressure Injury 02/09/19 Heel Right Unstageable - Full thickness tissue loss in which the base of the ulcer is covered by slough (yellow, tan, gray, green or brown) and/or eschar (tan, brown or black) in the wound bed. [place] nurse updated, this wound is un (Active)  02/09/19 1015  Location: Heel  Location Orientation: Right  Staging: Unstageable - Full thickness tissue loss in which the base of the ulcer is covered by slough (yellow, tan, gray, green or brown) and/or eschar (tan, brown or black) in the wound bed.  Wound Description (Comments): WOC nurse updated, this wound is unstageable  Present on Admission: Yes     Pressure Injury 02/09/19 Sacrum Medial Stage II -  Partial thickness loss of dermis presenting as a shallow open ulcer with a red, pink wound bed without slough. 4cm x 5cm center area wound with dark red wound base, periwound area with reddness and sloug (Active)  02/09/19 1015  Location: Sacrum  Location Orientation: Medial  Staging: Stage II -  Partial thickness loss of dermis presenting as a shallow open ulcer with a red, pink  wound bed without slough.  Wound Description (Comments): 4cm x 5cm center area wound with dark red wound base, periwound area with reddness and sloughing of skin noted  Present on Admission: Yes     Pressure Injury 04/13/19 Heel Left Stage III -  Full thickness tissue loss. Subcutaneous fat may be visible but bone, tendon or muscle are NOT exposed. (Active)  04/13/19 2000  Location: Heel  Location Orientation: Left  Staging: Stage III -  Full thickness tissue loss. Subcutaneous fat may be visible but bone, tendon or muscle are NOT exposed.  Wound Description (Comments):   Present on Admission: Yes     Pressure Injury 04/13/19 Heel Right Stage II -  Partial thickness loss of dermis presenting as a shallow open ulcer with a red, pink wound bed without slough. (Active)  04/13/19 2000  Location: Heel  Location Orientation: Right  Staging: Stage II -  Partial thickness loss of dermis presenting as a shallow open ulcer with a red, pink wound bed without slough.  Wound Description (Comments):  Present on Admission: Yes     Pressure Injury 04/13/19 Sacrum Unstageable - Full thickness tissue loss in which the base of the ulcer is covered by slough (yellow, tan, gray, green or brown) and/or eschar (tan, brown or black) in the wound bed. (Active)  04/13/19 2000  Location: Sacrum  Location Orientation:   Staging: Unstageable - Full thickness tissue loss in which the base of the ulcer is covered by slough (yellow, tan, gray, green or brown) and/or eschar (tan, brown or black) in the wound bed.  Wound Description (Comments):   Present on Admission: Yes    DVT Prophylaxis: Lovenox Code Status: DNR Family Communication: Son was updated yesterday. Disposition Plan: From a skilled nursing facility.  Should be able to return there when improved.   Medications:  Scheduled: . acetaminophen  650 mg Oral TID  . amitriptyline  100 mg Oral QHS  . amLODipine  2.5 mg Oral Daily  . vitamin C  500 mg  Oral Daily  . aspirin EC  81 mg Oral Daily  . B-complex with vitamin C  1 tablet Oral Daily  . cholecalciferol  2,000 Units Oral Daily  . collagenase  1 application Topical Daily  . dexamethasone (DECADRON) injection  6 mg Intravenous Q12H  . enoxaparin  40 mg Subcutaneous Q24H  . feeding supplement (PRO-STAT SUGAR FREE 64)  30 mL Oral BID BM  . finasteride  5 mg Oral Daily  . fluticasone  1 spray Each Nare Daily  . fluticasone furoate-vilanterol  1 puff Inhalation Daily  . gabapentin  700 mg Oral TID  . guaiFENesin  600 mg Oral BID  . Ipratropium-Albuterol  1 puff Inhalation Q6H  . latanoprost  1 drop Both Eyes QHS  . Melatonin  6 mg Oral QHS  . metoprolol tartrate  12.5 mg Oral BID  . multivitamin with minerals  1 tablet Oral Daily  . pantoprazole  40 mg Oral Daily  . saccharomyces boulardii  250 mg Oral Daily  . simvastatin  20 mg Oral Daily  . sodium hypochlorite   Topical BID  . [START ON 05/18/2019] sodium hypochlorite   Topical BID  . tamsulosin  0.4 mg Oral QHS  . umeclidinium bromide  1 puff Inhalation Daily  . zinc sulfate  220 mg Oral Daily   Continuous: . remdesivir 100 mg in NS 100 mL 100 mg (05/15/19 2051)   [id number], ALPRAZolam, bisacodyl, camphor-menthol, docusate sodium, guaiFENesin-dextromethorphan, nitroGLYCERIN   Objective:  Vital Signs  Vitals:   05/15/19 2000 05/16/19 0000 05/16/19 0430 05/16/19 0732  BP: 140/70 135/68 137/69 (!) 165/85  Pulse: 99 78 65 85  Resp: 20 20 20 17   Temp: 98.7 F (37.1 C) 97.6 F (36.4 C) 97.8 F (36.6 C) 97.7 F (36.5 C)  TempSrc: Oral Oral Oral Oral  SpO2: 96% 96% 100% 95%  Weight:      Height:        Intake/Output Summary (Last 24 hours) at 05/16/2019 1028 Last data filed at 05/16/2019 1000 Gross per 24 hour  Intake 1380 ml  Output 1221 ml  Net 159 ml   Filed Weights   05/14/19 1712 05/15/19 0300  Weight: 83 kg 83.2 kg    General appearance: Awake alert.  In no distress Resp: Less tachypneic.   Crackles at the bases bilaterally.  No wheezing or rhonchi.   Cardio: S1-S2 is normal regular.  No S3-S4.  No rubs murmurs or bruit GI: Abdomen is soft.  Nontender nondistended.  Bowel sounds are  present normal.  No masses organomegaly Extremities: No edema.  Both feet covered in dressing. He has a sacral decubitus Neurologic:   No focal neurological deficits.    Lab Results:  Data Reviewed: I have personally reviewed following labs and imaging studies  CBC: Recent Labs  Lab 05/14/19 1719 05/15/19 0255 05/16/19 0330  WBC 12.0* 8.3 9.3  NEUTROABS 10.6* 7.3 8.4*  HGB 8.6* 8.4* 8.4*  HCT 28.2* 28.6* 27.9*  MCV 91.9 91.7 91.5  PLT 443* 440* 459*    Basic Metabolic Panel: Recent Labs  Lab 05/14/19 1719 05/15/19 0255 05/16/19 0330  NA 136 137 137  K 4.4 4.8 4.8  CL 96* 95* 95*  CO2 31 31 32  GLUCOSE 114* 154* 173*  BUN 16 19 32*  CREATININE 0.84 0.78 0.93  CALCIUM 8.8* 9.0 9.1  MG  --  1.9 2.0  PHOS  --  4.6  --     GFR: Estimated Creatinine Clearance: 76.5 mL/min (by C-G formula based on SCr of 0.93 mg/dL).  Liver Function Tests: Recent Labs  Lab 05/14/19 1719 05/15/19 0255 05/16/19 0330  AST 18 17 14*  ALT 13 13 11   ALKPHOS 74 74 65  BILITOT 0.7 0.7 0.3  PROT 6.5 7.0 6.5  ALBUMIN 2.4* 2.7* 2.5*    Lipid Profile: Recent Labs    05/14/19 1719  TRIG 113    Anemia Panel: Recent Labs    05/15/19 0255 05/16/19 0330  FERRITIN 297 348*    Recent Results (from the past 240 hour(s))  Blood Culture (routine x 2)     Status: None (Preliminary result)   Collection Time: 05/14/19  8:15 PM   Specimen: BLOOD  Result Value Ref Range Status   Specimen Description BLOOD RIGHT ANTECUBITAL  Final   Special Requests   Final    BOTTLES DRAWN AEROBIC AND ANAEROBIC Blood Culture adequate volume   Culture   Final    NO GROWTH 2 DAYS Performed at [facility], [place] [address] [place], [place] [address]    Report Status PENDING  Incomplete       Radiology Studies: DG Chest Port 1 View  Result Date: 05/14/2019 CLINICAL DATA:  Shortness of breath in a patient who is COVID-19 positive. EXAM: PORTABLE CHEST 1 VIEW COMPARISON:  Single-view of the chest 04/13/2019. FINDINGS: Extensive, patchy bilateral airspace disease has progressed since the prior examination. Calcified pleural plaques and pleural thickening in the right chest are again seen. Heart size is enlarged. Aortic atherosclerosis. IMPRESSION: Worsening of patchy bilateral airspace disease most consistent with pneumonia. Calcified pleural plaques and pleural scarring along the lateral right chest wall. Cardiomegaly. Atherosclerosis. Electronically Signed   By: Inge Rise M.D.   On: 05/14/2019 17:42       LOS: 2 days   [facility] Pager on [email or web address]  05/16/2019, 10:28 AM

## 2019-05-17 ENCOUNTER — Encounter (HOSPITAL_COMMUNITY): Payer: Medicare Other

## 2019-05-17 LAB — COMPREHENSIVE METABOLIC PANEL
ALT: 10 U/L (ref 0–44)
AST: 15 U/L (ref 15–41)
Albumin: 2.5 g/dL — ABNORMAL LOW (ref 3.5–5.0)
Alkaline Phosphatase: 62 U/L (ref 38–126)
Anion gap: 10 (ref 5–15)
BUN: 27 mg/dL — ABNORMAL HIGH (ref 8–23)
CO2: 32 mmol/L (ref 22–32)
Calcium: 9.1 mg/dL (ref 8.9–10.3)
Chloride: 94 mmol/L — ABNORMAL LOW (ref 98–111)
Creatinine, Ser: 0.68 mg/dL (ref 0.61–1.24)
GFR calc Af Amer: >60 mL/min (ref >60)
GFR calc non Af Amer: >60 mL/min (ref >60)
Glucose, Bld: 139 mg/dL — ABNORMAL HIGH (ref 70–99)
Potassium: 4.6 mmol/L (ref 3.5–5.1)
Sodium: 136 mmol/L (ref 135–145)
Total Bilirubin: 0.4 mg/dL (ref 0.3–1.2)
Total Protein: 6.4 g/dL — ABNORMAL LOW (ref 6.5–8.1)

## 2019-05-17 LAB — CBC WITH DIFFERENTIAL/PLATELET
Abs Immature Granulocytes: 0.11 10*3/uL — ABNORMAL HIGH (ref 0.00–0.07)
Basophils Absolute: 0 10*3/uL (ref 0.0–0.1)
Basophils Relative: 0 %
Eosinophils Absolute: 0 10*3/uL (ref 0.0–0.5)
Eosinophils Relative: 0 %
HCT: 29 % — ABNORMAL LOW (ref 39.0–52.0)
Hemoglobin: 8.8 g/dL — ABNORMAL LOW (ref 13.0–17.0)
Immature Granulocytes: 2 %
Lymphocytes Relative: 8 %
Lymphs Abs: 0.5 10*3/uL — ABNORMAL LOW (ref 0.7–4.0)
MCH: 27.1 pg (ref 26.0–34.0)
MCHC: 30.3 g/dL (ref 30.0–36.0)
MCV: 89.2 fL (ref 80.0–100.0)
Monocytes Absolute: 0.2 10*3/uL (ref 0.1–1.0)
Monocytes Relative: 4 %
Neutro Abs: 4.9 10*3/uL (ref 1.7–7.7)
Neutrophils Relative %: 86 %
Platelets: 507 10*3/uL — ABNORMAL HIGH (ref 150–400)
RBC: 3.25 MIL/uL — ABNORMAL LOW (ref 4.22–5.81)
RDW: 17 % — ABNORMAL HIGH (ref 11.5–15.5)
WBC: 5.6 10*3/uL (ref 4.0–10.5)
nRBC: 0 % (ref 0.0–0.2)

## 2019-05-17 LAB — C-REACTIVE PROTEIN: CRP: 4.6 mg/dL — ABNORMAL HIGH (ref <1.0)

## 2019-05-17 LAB — MAGNESIUM: Magnesium: 1.8 mg/dL (ref 1.7–2.4)

## 2019-05-17 LAB — D-DIMER, QUANTITATIVE: D-Dimer, Quant: 1.08 ug/mL-FEU — ABNORMAL HIGH (ref 0.00–0.50)

## 2019-05-17 LAB — FERRITIN: Ferritin: 345 ng/mL — ABNORMAL HIGH (ref 24–336)

## 2019-05-17 MED ORDER — MORPHINE SULFATE (PF) 2 MG/ML IV SOLN: 4.0000 mg | INTRAVENOUS | Status: DC | PRN

## 2019-05-17 MED ORDER — MORPHINE SULFATE (PF) 2 MG/ML IV SOLN
2.0000 mg | INTRAVENOUS | Status: DC | PRN
  Administered 2019-05-17: 2 mg via INTRAVENOUS
  Filled 2019-05-17: qty 1

## 2019-05-17 NOTE — Progress Notes (Signed)
PROGRESS NOTE  Curtis [name] [id number] DOB: [birth year] DOA: 05/14/2019  PCP: [name], MD  Brief History/Interval Summary: 74 year old Caucasian male with a past medical history of pulmonary asbestosis, coronary artery disease status post PCI x2, history of AVNRT status post ablation, history of COPD, chronic respiratory failure with hypoxia on 4 L of oxygen at home, essential hypertension, hyperlipidemia, GERD, peripheral artery disease who is currently in skilled nursing facility after recent surgery for hip fracture.  He presented with shortness of breath.  Found to have positive COVID-19 test result.  Chest x-ray showed bilateral opacities.  He was hospitalized for further management.   Reason for Visit: Pneumonia due to COVID-19.  Acute respiratory failure with hypoxia  Consultants: None  Procedures: None  Antibiotics: Anti-infectives (From admission, onward)   Start     Dose/Rate Route Frequency Ordered Stop   05/16/19 1000  remdesivir 100 mg in sodium chloride 0.9 % 100 mL IVPB  Status:  Discontinued     100 mg 200 mL/hr over 30 Minutes Intravenous Every 24 hours 05/15/19 0229 05/15/19 1341   05/15/19 2000  remdesivir 100 mg in sodium chloride 0.9 % 100 mL IVPB  Status:  Discontinued     100 mg 200 mL/hr over 30 Minutes Intravenous Daily 05/14/19 2039 05/15/19 0229   05/15/19 1800  remdesivir 100 mg in sodium chloride 0.9 % 100 mL IVPB     100 mg 200 mL/hr over 30 Minutes Intravenous Every 24 hours 05/15/19 1341 05/19/19 1759   05/14/19 2045  remdesivir 200 mg in sodium chloride 0.9% 250 mL IVPB     200 mg 580 mL/hr over 30 Minutes Intravenous Once 05/14/19 2039 05/15/19 0128      Subjective/Interval History: Patient mentions that he is feeling well.  Denies any complaints.  Shortness of breath is improved.    Assessment/Plan:  Acute Hypoxic Resp. Failure/Pneumonia due to COVID-19   Recent Labs  Lab 05/14/19 1719 05/15/19 0255 05/16/19 0330  05/17/19 0315  DDIMER 2.03* 1.39* 1.02* 1.08*  FERRITIN 288 297 348* 345*  CRP 11.8* 16.2* 10.8* 4.6*  ALT 13 13 11 10   PROCALCITON <0.10  --   --   --     Objective findings: Fever: Remains afebrile Oxygen requirements: Nasal cannula.  3 L/min.  Saturating in the early 90s.    COVID 19 Therapeutics: Antibacterials: None Remdesivir: Day 4 Steroids: Dexamethasone 6 mg daily Diuretics: Not given yet Actemra: Not given yet Convalescent Plasma: Transfused on 1/10 Vitamin C and Zinc: Continue PUD Prophylaxis: On Protonix DVT Prophylaxis:  Lovenox   Patient seems to be improving from a respiratory standpoint.  He is on 4 L of oxygen at baseline.  He has been weaned down to 3 L here.  Inflammatory markers have improved with CRP down to 4.6.  He remains on remdesivir and steroids.  He also received convalescent plasma.  Procalcitonin was less than 0.1.  Continue with incentive spirometry.  PT and OT.  He lives in a skilled nursing facility and apparently has poor mobility at baseline.  History of COPD/chronic respiratory failure with hypoxia on home oxygen Patient uses 4 L at baseline.  He is currently in a skilled nursing facility after he sustained left hip fracture few months ago.  Continue with his inhalers.  Otherwise see above.  Followed by Dr. [name] with pulmonology.  History of coronary artery disease status post PCI/history of AVNRT status post ablation/history of atrial flutter Stable.  Followed by Dr. [name] with  cardiology.  Currently not on anticoagulation.  He is on aspirin statin and beta-blocker.  Normocytic anemia Hemoglobin is at baseline.  No evidence for overt bleeding.  Continue to monitor.  Transfuse if it drops below 7.  Hemoglobin remains stable  Peripheral artery disease Stable.  Continue to monitor.  He is noted to have ulcers in both his heels.  Patient has poor pulses.  However his feet are warm to touch.  Apparently some issue in getting arterial Doppler in  this campus.  This can be deferred to the outpatient setting.  He will also need outpatient follow-up with vascular surgery depending on his Doppler studies which can be done in the next few weeks.  Lower extremity wounds involving the bilateral heels left more than right, present on admission Wound care consult.  These are likely pressure injuries but patient does have a history of PAD and so that should also be considered as possible etiology.  See above.  Continue wound care.  Stage IV sacral decubitus, present on admission Wound care to follow.  Essential hypertension Continue amlodipine and metoprolol.  Hyperlipidemia Continue simvastatin.  History of glaucoma Continue eyedrops.  History of BPH Continue finasteride and Flomax.  Peripheral neuropathy Continue gabapentin  Pressure injuries, present on admission Pressure Injury 02/09/19 Heel Right Unstageable - Full thickness tissue loss in which the base of the ulcer is covered by slough (yellow, tan, gray, green or brown) and/or eschar (tan, brown or black) in the wound bed. [place] nurse updated, this wound is un (Active)  02/09/19 1015  Location: Heel  Location Orientation: Right  Staging: Unstageable - Full thickness tissue loss in which the base of the ulcer is covered by slough (yellow, tan, gray, green or brown) and/or eschar (tan, brown or black) in the wound bed.  Wound Description (Comments): WOC nurse updated, this wound is unstageable  Present on Admission: Yes     Pressure Injury 02/09/19 Sacrum Medial Stage II -  Partial thickness loss of dermis presenting as a shallow open ulcer with a red, pink wound bed without slough. 4cm x 5cm center area wound with dark red wound base, periwound area with reddness and sloug (Active)  02/09/19 1015  Location: Sacrum  Location Orientation: Medial  Staging: Stage II -  Partial thickness loss of dermis presenting as a shallow open ulcer with a red, pink wound bed without slough.   Wound Description (Comments): 4cm x 5cm center area wound with dark red wound base, periwound area with reddness and sloughing of skin noted  Present on Admission: Yes     Pressure Injury 04/13/19 Heel Left Stage III -  Full thickness tissue loss. Subcutaneous fat may be visible but bone, tendon or muscle are NOT exposed. (Active)  04/13/19 2000  Location: Heel  Location Orientation: Left  Staging: Stage III -  Full thickness tissue loss. Subcutaneous fat may be visible but bone, tendon or muscle are NOT exposed.  Wound Description (Comments):   Present on Admission: Yes     Pressure Injury 04/13/19 Heel Right Stage II -  Partial thickness loss of dermis presenting as a shallow open ulcer with a red, pink wound bed without slough. (Active)  04/13/19 2000  Location: Heel  Location Orientation: Right  Staging: Stage II -  Partial thickness loss of dermis presenting as a shallow open ulcer with a red, pink wound bed without slough.  Wound Description (Comments):   Present on Admission: Yes     Pressure Injury 04/13/19 Sacrum Unstageable -  Full thickness tissue loss in which the base of the ulcer is covered by slough (yellow, tan, gray, green or brown) and/or eschar (tan, brown or black) in the wound bed. (Active)  04/13/19 2000  Location: Sacrum  Location Orientation:   Staging: Unstageable - Full thickness tissue loss in which the base of the ulcer is covered by slough (yellow, tan, gray, green or brown) and/or eschar (tan, brown or black) in the wound bed.  Wound Description (Comments):   Present on Admission: Yes    DVT Prophylaxis: Lovenox Code Status: DNR Family Communication: Son being updated daily Disposition Plan: Okay to transition to [place].  Potential discharge to skilled nursing facility tomorrow after his last dose of remdesivir.   Medications:  Scheduled: . acetaminophen  650 mg Oral TID  . amitriptyline  100 mg Oral QHS  . amLODipine  2.5 mg Oral Daily  .  vitamin C  500 mg Oral Daily  . aspirin EC  81 mg Oral Daily  . B-complex with vitamin C  1 tablet Oral Daily  . cholecalciferol  2,000 Units Oral Daily  . collagenase  1 application Topical Daily  . dexamethasone (DECADRON) injection  6 mg Intravenous Q12H  . enoxaparin  40 mg Subcutaneous Q24H  . feeding supplement (ENSURE ENLIVE)  237 mL Oral TID BM  . feeding supplement (PRO-STAT SUGAR FREE 64)  30 mL Oral BID BM  . finasteride  5 mg Oral Daily  . fluticasone  1 spray Each Nare Daily  . fluticasone furoate-vilanterol  1 puff Inhalation Daily  . gabapentin  700 mg Oral TID  . guaiFENesin  600 mg Oral BID  . Ipratropium-Albuterol  1 puff Inhalation Q6H  . latanoprost  1 drop Both Eyes QHS  . Melatonin  6 mg Oral QHS  . metoprolol tartrate  12.5 mg Oral BID  . multivitamin with minerals  1 tablet Oral Daily  . pantoprazole  40 mg Oral Daily  . saccharomyces boulardii  250 mg Oral Daily  . simvastatin  20 mg Oral Daily  . sodium hypochlorite   Topical BID  . [START ON 05/18/2019] sodium hypochlorite   Topical BID  . tamsulosin  0.4 mg Oral QHS  . umeclidinium bromide  1 puff Inhalation Daily  . zinc sulfate  220 mg Oral Daily   Continuous: . remdesivir 100 mg in NS 100 mL 100 mg (05/16/19 1608)   [id number], ALPRAZolam, bisacodyl, camphor-menthol, docusate sodium, guaiFENesin-dextromethorphan, morphine injection **OR** morphine injection, nitroGLYCERIN   Objective:  Vital Signs  Vitals:   05/17/19 0000 05/17/19 0400 05/17/19 0722 05/17/19 1114  BP: (!) 151/78 (!) 149/87 (!) 145/73 (!) 168/88  Pulse: 69 70 67 97  Resp: 18 20 17 20   Temp: 97.7 F (36.5 C) 97.9 F (36.6 C) 98.1 F (36.7 C) 98.1 F (36.7 C)  TempSrc: Oral Oral Oral Oral  SpO2: 96% 96% 96% 94%  Weight:      Height:        Intake/Output Summary (Last 24 hours) at 05/17/2019 1132 Last data filed at 05/17/2019 1039 Gross per 24 hour  Intake 1195.68 ml  Output 2550 ml  Net -1354.32 ml   Filed  Weights   05/14/19 1712 05/15/19 0300  Weight: 83 kg 83.2 kg    General appearance: Awake alert.  In no distress Resp: Improved air entry bilaterally.  Few crackles at the bases.  No wheezing or rhonchi.   Cardio: S1-S2 is normal regular.  No S3-S4.  No rubs murmurs  or bruit GI: Abdomen is soft.  Nontender nondistended.  Bowel sounds are present normal.  No masses organomegaly Extremities: Wounds bilateral feet.  Covered with dressing.  Feet are warm to touch.  Poor pulses. Neurologic:.  No focal neurological deficits.    Lab Results:  Data Reviewed: I have personally reviewed following labs and imaging studies  CBC: Recent Labs  Lab 05/14/19 1719 05/15/19 0255 05/16/19 0330 05/17/19 0315  WBC 12.0* 8.3 9.3 5.6  NEUTROABS 10.6* 7.3 8.4* 4.9  HGB 8.6* 8.4* 8.4* 8.8*  HCT 28.2* 28.6* 27.9* 29.0*  MCV 91.9 91.7 91.5 89.2  PLT 443* 440* 459* 507*    Basic Metabolic Panel: Recent Labs  Lab 05/14/19 1719 05/15/19 0255 05/16/19 0330 05/17/19 0315  NA 136 137 137 136  K 4.4 4.8 4.8 4.6  CL 96* 95* 95* 94*  CO2 31 31 32 32  GLUCOSE 114* 154* 173* 139*  BUN 16 19 32* 27*  CREATININE 0.84 0.78 0.93 0.68  CALCIUM 8.8* 9.0 9.1 9.1  MG  --  1.9 2.0 1.8  PHOS  --  4.6  --   --     GFR: Estimated Creatinine Clearance: 88.9 mL/min (by C-G formula based on SCr of 0.68 mg/dL).  Liver Function Tests: Recent Labs  Lab 05/14/19 1719 05/15/19 0255 05/16/19 0330 05/17/19 0315  AST 18 17 14* 15  ALT 13 13 11 10   ALKPHOS 74 74 65 62  BILITOT 0.7 0.7 0.3 0.4  PROT 6.5 7.0 6.5 6.4*  ALBUMIN 2.4* 2.7* 2.5* 2.5*    Lipid Profile: Recent Labs    05/14/19 1719  TRIG 113    Anemia Panel: Recent Labs    05/16/19 0330 05/17/19 0315  FERRITIN 348* 345*    Recent Results (from the past 240 hour(s))  Blood Culture (routine x 2)     Status: None (Preliminary result)   Collection Time: 05/14/19  8:15 PM   Specimen: BLOOD  Result Value Ref Range Status   Specimen  Description BLOOD RIGHT ANTECUBITAL  Final   Special Requests   Final    BOTTLES DRAWN AEROBIC AND ANAEROBIC Blood Culture adequate volume   Culture   Final    NO GROWTH 2 DAYS Performed at [facility], [place] [address] [place], [place] [address]    Report Status PENDING  Incomplete      Radiology Studies: No results found.     LOS: 3 days   Taelyn Nemes [employer] on [email or web address]  05/17/2019, 11:32 AM

## 2019-05-17 NOTE — Plan of Care (Signed)
Pt updated son via phone. Tele discontinued and pt may transfer to [facility]. VSS on 3L [place]. Pt inquiring about discharge plan. Pt informed that SW/CM will be involved in finding placement. Pt refusing Ensure. Will continue to monitor.  Problem: Education: Goal: Knowledge of risk factors and measures for prevention of condition will improve Outcome: Progressing   Problem: Coping: Goal: Psychosocial and spiritual needs will be supported Outcome: Progressing   Problem: Respiratory: Goal: Will maintain a patent airway Outcome: Progressing Goal: Complications related to the disease process, condition or treatment will be avoided or minimized Outcome: Progressing   Problem: Education: Goal: Knowledge of General Education information will improve Description: Including pain rating scale, medication(s)/side effects and non-pharmacologic comfort measures Outcome: Progressing   Problem: Health Behavior/Discharge Planning: Goal: Ability to manage health-related needs will improve Outcome: Progressing   Problem: Clinical Measurements: Goal: Ability to maintain clinical measurements within normal limits will improve Outcome: Progressing Goal: Will remain free from infection Outcome: Progressing Goal: Diagnostic test results will improve Outcome: Progressing Goal: Respiratory complications will improve Outcome: Progressing Goal: Cardiovascular complication will be avoided Outcome: Progressing   Problem: Activity: Goal: Risk for activity intolerance will decrease Outcome: Progressing   Problem: Nutrition: Goal: Adequate nutrition will be maintained Outcome: Progressing   Problem: Coping: Goal: Level of anxiety will decrease Outcome: Progressing   Problem: Elimination: Goal: Will not experience complications related to bowel motility Outcome: Progressing Goal: Will not experience complications related to urinary retention Outcome: Progressing   Problem: Pain Managment: Goal: General  experience of comfort will improve Outcome: Progressing   Problem: Safety: Goal: Ability to remain free from injury will improve Outcome: Progressing   Problem: Skin Integrity: Goal: Risk for impaired skin integrity will decrease Outcome: Progressing

## 2019-05-18 LAB — COMPREHENSIVE METABOLIC PANEL
ALT: 13 U/L (ref 0–44)
AST: 16 U/L (ref 15–41)
Albumin: 2.7 g/dL — ABNORMAL LOW (ref 3.5–5.0)
Alkaline Phosphatase: 59 U/L (ref 38–126)
Anion gap: 8 (ref 5–15)
BUN: 29 mg/dL — ABNORMAL HIGH (ref 8–23)
CO2: 31 mmol/L (ref 22–32)
Calcium: 8.9 mg/dL (ref 8.9–10.3)
Chloride: 96 mmol/L — ABNORMAL LOW (ref 98–111)
Creatinine, Ser: 0.69 mg/dL (ref 0.61–1.24)
GFR calc Af Amer: >60 mL/min (ref >60)
GFR calc non Af Amer: >60 mL/min (ref >60)
Glucose, Bld: 148 mg/dL — ABNORMAL HIGH (ref 70–99)
Potassium: 4.7 mmol/L (ref 3.5–5.1)
Sodium: 135 mmol/L (ref 135–145)
Total Bilirubin: 0.7 mg/dL (ref 0.3–1.2)
Total Protein: 6.4 g/dL — ABNORMAL LOW (ref 6.5–8.1)

## 2019-05-18 LAB — CBC WITH DIFFERENTIAL/PLATELET
Abs Immature Granulocytes: 0.34 10*3/uL — ABNORMAL HIGH (ref 0.00–0.07)
Basophils Absolute: 0 10*3/uL (ref 0.0–0.1)
Basophils Relative: 0 %
Eosinophils Absolute: 0 10*3/uL (ref 0.0–0.5)
Eosinophils Relative: 0 %
HCT: 30 % — ABNORMAL LOW (ref 39.0–52.0)
Hemoglobin: 9.3 g/dL — ABNORMAL LOW (ref 13.0–17.0)
Immature Granulocytes: 4 %
Lymphocytes Relative: 7 %
Lymphs Abs: 0.6 10*3/uL — ABNORMAL LOW (ref 0.7–4.0)
MCH: 27.5 pg (ref 26.0–34.0)
MCHC: 31 g/dL (ref 30.0–36.0)
MCV: 88.8 fL (ref 80.0–100.0)
Monocytes Absolute: 0.3 10*3/uL (ref 0.1–1.0)
Monocytes Relative: 4 %
Neutro Abs: 7.3 10*3/uL (ref 1.7–7.7)
Neutrophils Relative %: 85 %
Platelets: 548 10*3/uL — ABNORMAL HIGH (ref 150–400)
RBC: 3.38 MIL/uL — ABNORMAL LOW (ref 4.22–5.81)
RDW: 17.1 % — ABNORMAL HIGH (ref 11.5–15.5)
WBC: 8.6 10*3/uL (ref 4.0–10.5)
nRBC: 0 % (ref 0.0–0.2)

## 2019-05-18 LAB — D-DIMER, QUANTITATIVE: D-Dimer, Quant: 1.01 ug/mL-FEU — ABNORMAL HIGH (ref 0.00–0.50)

## 2019-05-18 LAB — FERRITIN: Ferritin: 319 ng/mL (ref 24–336)

## 2019-05-18 LAB — C-REACTIVE PROTEIN: CRP: 2 mg/dL — ABNORMAL HIGH (ref <1.0)

## 2019-05-18 LAB — MAGNESIUM: Magnesium: 1.9 mg/dL (ref 1.7–2.4)

## 2019-05-18 MED ORDER — DEXAMETHASONE SODIUM PHOSPHATE 10 MG/ML IJ SOLN
6.0000 mg | INTRAMUSCULAR | Status: DC
  Administered 2019-05-19 – 2019-05-20 (×2): 6 mg via INTRAVENOUS
  Filled 2019-05-18 (×2): qty 1

## 2019-05-18 NOTE — NC FL2 (Signed)
[place] MEDICAID FL2 LEVEL OF CARE SCREENING TOOL     IDENTIFICATION  Patient Name: Curtis [name] Birthdate: 12/03/1945 Sex: male Admission Date (Current Location): 05/14/2019  [facility] and [place] Number:  Herbalist and Address:  [facility], [place], [place])      Provider Number: [id number]  Attending Physician Name and Address:  Elodia Florence., *  Relative Name and Phone Number:       Current Level of Care:   Recommended Level of Care: [place] Prior Approval Number:    Date Approved/Denied:   PASRR Number:    Discharge Plan:      Current Diagnoses: Patient Active Problem List   Diagnosis Date Noted  . Pneumonia due to COVID-19 virus 05/14/2019  . Closed left hip fracture ([place]) 04/13/2019  . Hip fracture ([place]) 04/13/2019  . Closed comminuted intertrochanteric fracture of proximal end of left femur ([place])   . Protein-calorie malnutrition, severe ([place]) 03/12/2019  . Chronic diastolic heart failure ([place]) 03/02/2019  . Unstageable pressure ulcer of left buttock ([place]) 02/21/2019  . Decubitus ulcer of heel, bilateral, unstageable ([place]) 02/21/2019  . Anasarca 02/20/2019  . BPH with urinary obstruction 02/20/2019  . Sacral decubitus ulcer, stage IV ([place])   . Physical deconditioning 02/04/2019  . Pressure injury of deep tissue of left heel 01/27/2019  . Atrial flutter, chronic ([place]) 01/15/2019  . Bilateral open angle glaucoma 01/15/2019  . Anxiety disorder 01/15/2019  . Pressure ulcer of right heel, stage 2 ([place]) 01/03/2019  . Chronic respiratory failure with hypoxia and hypercapnia ([place]) 01/02/2019  . Situational mixed anxiety and depressive disorder 02/28/2018  . Carotid artery disease ([place]) 02/27/2016  . Hereditary and idiopathic peripheral neuropathy 01/30/2015  . Anemia, iron deficiency 05/08/2014  . Urinary retention 05/08/2014  . Shortness of breath   .  Hyperlipidemia LDL goal <70 04/20/2014  . AAA (abdominal aortic aneurysm), stable 04/20/2014  . SOB (shortness of breath)   . Peripheral vascular disease ([place])   . Presence of drug coated stent in left circumflex coronary artery: Promus DES 3.5 mm x 38 mm (3.9 mm) 01/17/2014  . CAD (coronary artery disease), native coronary artery   . SVT (supraventricular tachycardia) s/p ablation 01/16/2014 01/16/2014  . 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  . TIA (transient ischemic attack) 09/06/2013  . HTN (hypertension) 09/05/2013  . TOBACCO ABUSE, hx 08/23/2007  . COPD with chronic bronchitis ([place]) 08/12/2007  . Asbestosis ([place]) 08/12/2007  . Reflex sympathetic dystrophy 05/29/2007  . GERD 05/29/2007    Orientation RESPIRATION BLADDER Height & Weight     Time, Place, Situation, Self  O2(3L [place])   Weight: 183 lb 6.8 oz (83.2 kg) Height:  6' (182.9 cm)  BEHAVIORAL SYMPTOMS/MOOD NEUROLOGICAL BOWEL NUTRITION STATUS           AMBULATORY STATUS COMMUNICATION OF NEEDS Skin       Other (Comment), PU Stage and Appropriate Care(stage 4 to sacrum; unstageable to bilateral heels, see WOC notes)                       Personal Care Assistance Level of Assistance  Bathing, Dressing Bathing Assistance: Limited assistance   Dressing Assistance: Limited assistance     Functional Limitations Info             SPECIAL CARE FACTORS FREQUENCY  OT (By licensed OT), PT (By licensed PT)(WOUND CARE)  Contractures Contractures Info: Not present    Additional Factors Info  Code Status Code Status Info: DNR             Current Medications (05/18/2019):  This is the current hospital active medication list Current Facility-Administered Medications  Medication Dose Route Frequency Provider Last Rate Last Admin  . acetaminophen (TYLENOL) tablet 650 mg  650 mg Oral TID Truddie Hidden, MD   650 mg at 05/18/19 0932  . acetaminophen (TYLENOL) tablet 650 mg  650  mg Oral Q6H PRN Truddie Hidden, MD      . ALPRAZolam [name]) tablet 1 mg  1 mg Oral TID PRN Bonnielee Haff, MD   1 mg at 05/18/19 0956  . amitriptyline (ELAVIL) tablet 100 mg  100 mg Oral QHS Truddie Hidden, MD   100 mg at 05/17/19 2145  . amLODipine (NORVASC) tablet 2.5 mg  2.5 mg Oral Daily Truddie Hidden, MD   2.5 mg at 05/18/19 [id number]  . ascorbic acid (VITAMIN C) tablet 500 mg  500 mg Oral Daily Truddie Hidden, MD   500 mg at 05/18/19 0933  . aspirin EC tablet 81 mg  81 mg Oral Daily Truddie Hidden, MD   81 mg at 05/18/19 0933  . B-complex with vitamin C tablet 1 tablet  1 tablet Oral Daily Truddie Hidden, MD   1 tablet at 05/18/19 0934  . bisacodyl (DULCOLAX) EC tablet 5 mg  5 mg Oral Daily PRN Truddie Hidden, MD      . camphor-menthol [facility]) lotion 1 application  1 application Topical BID PRN Truddie Hidden, MD      . cholecalciferol (VITAMIN D3) tablet 2,000 Units  2,000 Units Oral Daily Truddie Hidden, MD   2,000 Units at 05/18/19 0933  . collagenase (SANTYL) ointment 1 application  1 application Topical Daily Truddie Hidden, MD   Stopped at 05/18/19 0945  . dexamethasone (DECADRON) injection 6 mg  6 mg Intravenous Q12H Bonnielee Haff, MD   6 mg at 05/18/19 0935  . docusate sodium (COLACE) capsule 100 mg  100 mg Oral Daily PRN Truddie Hidden, MD      . enoxaparin (LOVENOX) injection 40 mg  40 mg Subcutaneous Q24H Truddie Hidden, MD   40 mg at 05/17/19 2147  . feeding supplement (ENSURE ENLIVE) (ENSURE ENLIVE) liquid 237 mL  237 mL Oral TID BM Bonnielee Haff, MD      . feeding supplement (PRO-STAT SUGAR FREE 64) liquid 30 mL  30 mL Oral BID BM Truddie Hidden, MD   30 mL at 05/17/19 1015  . finasteride (PROSCAR) tablet 5 mg  5 mg Oral Daily Truddie Hidden, MD   5 mg at 05/18/19 0934  . fluticasone (FLONASE) 50 MCG/ACT nasal spray 1 spray  1 spray Each Nare Daily Truddie Hidden, MD   1 spray at 05/18/19 0944  . fluticasone furoate-vilanterol (BREO ELLIPTA)  200-25 MCG/INH 1 puff  1 puff Inhalation Daily Truddie Hidden, MD   1 puff at 05/18/19 0944  . gabapentin (NEURONTIN) capsule 700 mg  700 mg Oral TID Truddie Hidden, MD   700 mg at 05/18/19 0932  . guaiFENesin (MUCINEX) 12 hr tablet 600 mg  600 mg Oral BID Truddie Hidden, MD   600 mg at 05/18/19 0933  . guaiFENesin-dextromethorphan (ROBITUSSIN DM) 100-10 MG/5ML syrup 10 mL  10 mL Oral Q4H PRN Truddie Hidden, MD      . Ipratropium-Albuterol (COMBIVENT) respimat 1 puff  1 puff Inhalation Q6H Truddie Hidden, MD   1 puff at 05/18/19  0753  . latanoprost (XALATAN) 0.005 % ophthalmic solution 1 drop  1 drop Both Eyes QHS Truddie Hidden, MD   1 drop at 05/17/19 2147  . Melatonin TABS 6 mg  6 mg Oral QHS Truddie Hidden, MD   6 mg at 05/17/19 2146  . metoprolol tartrate (LOPRESSOR) tablet 12.5 mg  12.5 mg Oral BID Truddie Hidden, MD   12.5 mg at 05/18/19 [id number]  . morphine 2 MG/ML injection 2 mg  2 mg Intravenous Q4H PRN Peyton Bottoms, MD   2 mg at 05/17/19 0355   Or  . morphine 2 MG/ML injection 4 mg  4 mg Intravenous Q4H PRN Peyton Bottoms, MD      . multivitamin with minerals tablet 1 tablet  1 tablet Oral Daily Truddie Hidden, MD   1 tablet at 05/18/19 0933  . nitroGLYCERIN (NITROSTAT) SL tablet 0.4 mg  0.4 mg Sublingual Q5 Min x 3 PRN Truddie Hidden, MD      . pantoprazole (PROTONIX) EC tablet 40 mg  40 mg Oral Daily Truddie Hidden, MD   40 mg at 05/18/19 0933  . remdesivir 100 mg in sodium chloride 0.9 % 100 mL IVPB  100 mg Intravenous Q24H Bonnielee Haff, MD 200 mL/hr at 05/17/19 1621 100 mg at 05/17/19 1621  . saccharomyces boulardii (FLORASTOR) capsule 250 mg  250 mg Oral Daily Truddie Hidden, MD   250 mg at 05/18/19 0934  . simvastatin (ZOCOR) tablet 20 mg  20 mg Oral Daily Truddie Hidden, MD   20 mg at 05/18/19 0934  . sodium hypochlorite (DAKIN'S 1/4 STRENGTH) topical solution   Topical BID Bonnielee Haff, MD   Stopped at 05/18/19 [phone]  . tamsulosin (FLOMAX) capsule 0.4  mg  0.4 mg Oral QHS Truddie Hidden, MD   0.4 mg at 05/17/19 2145  . umeclidinium bromide (INCRUSE ELLIPTA) 62.5 MCG/INH 1 puff  1 puff Inhalation Daily Truddie Hidden, MD   1 puff at 05/18/19 0754  . zinc sulfate capsule 220 mg  220 mg Oral Daily Truddie Hidden, MD   220 mg at 05/18/19 [id number]     Discharge Medications: Please see discharge summary for a list of discharge medications.  Relevant Imaging Results:  Relevant Lab Results:   Additional Information    Amador Cunas, [place]

## 2019-05-18 NOTE — Progress Notes (Addendum)
0745: Assumed care of Pt from night RN. Pt alert, denies pain, SpO2 93% on 2 L [place]. Seems a little withdrawn, but hasn't been sleeping at night so just requesting to rest this morning, denies other needs at this time, call bell within reach, bed alarm on.   1220: Pt resting comfortably in bed. SpO2 96% on 2 L [place]. Denies any needs at this time. Will monitor  1300: Elta [place] updated on Pt status and possible discharge today.   1530: Reassessed, VSS, still seems a bit withdrawn, worried about spreading COVID to others once discharged back to his facility, poor appetite today.Helped reposition, denies other needs, call bell within reach

## 2019-05-18 NOTE — Plan of Care (Signed)

## 2019-05-18 NOTE — Progress Notes (Signed)
PROGRESS NOTE    Curtis [name]  [id number] DOB: 12-19-1945 DOA: 05/14/2019 PCP: Curtis Sacramento, MD   Brief Narrative:  74 year old Caucasian male with Curtis [name] past medical history of pulmonary asbestosis, coronary artery disease status post PCI x2, history of AVNRT status post ablation, history of COPD, chronic respiratory failure with hypoxia on 4 L of oxygen at home, essential hypertension, hyperlipidemia, GERD, peripheral artery disease who is currently in skilled nursing facility after recent surgery for hip fracture.  He presented with shortness of breath.  Found to have positive COVID-19 test result.  Chest x-ray showed bilateral opacities.  He was hospitalized for further management.  Assessment & Plan:   Active Problems:   Pneumonia due to COVID-19 virus  Acute Hypoxic Respiratory Failure 2/2 COVID 19 Currently on about 3 L, doing well (chronically on 4 L) Continue steroids, remdesivir.  S/p convalescent plasma 1/10 I/O, daily weights IS, OOB, prone as able  COVID-19 Labs  Recent Labs    05/16/19 0330 05/17/19 0315 05/18/19 0452  DDIMER 1.02* 1.08* 1.01*  FERRITIN 348* 345* 319  CRP 10.8* 4.6* 2.0*    Lab Results  Component Value Date   SARSCOV2NAA NEGATIVE 04/18/2019   [place] NEGATIVE 04/13/2019   [place] NEGATIVE 02/26/2019   [place] NEGATIVE 02/08/2019   History of COPD/chronic respiratory failure with hypoxia on home oxygen Patient uses 4 L at baseline.  He is currently in [name] facility after he sustained left hip fracture few months ago.  Continue with his inhalers.  Otherwise see above.  Followed by Dr. [name] [name] with pulmonology.  History of coronary artery disease status post PCI/history of AVNRT status post ablation/history of atrial flutter Stable.  Followed by Dr. Harrington [name] with cardiology.  Currently not on anticoagulation.  He is on aspirin statin and beta-blocker.  Normocytic anemia Hemoglobin is at baseline.  No evidence for  overt bleeding.  Continue to monitor.  Transfuse if it drops below 7.  Hemoglobin remains stable  Peripheral artery disease Stable.  Continue to monitor.  He is noted to have ulcers in both his heels.  Patient has poor pulses (bilateral DP pulses dopplerable 1/13).  However his feet are warm to touch.  Needs ABI's.  He will also need outpatient follow-up with vascular surgery depending on his Doppler studies which can be done in the next few weeks.  Lower extremity wounds involving the bilateral heels left more than right, present on admission Wound care consult, appreciate recs. DP pulses are dopplerable These are likely pressure injuries but patient does have Curtis [name] history of PAD and so that should also be considered as possible etiology.  See above.  Continue wound care.  Stage IV sacral decubitus, present on admission Wound care to follow.  Essential hypertension Continue amlodipine and metoprolol.  Hyperlipidemia Continue simvastatin.  History of glaucoma Continue eyedrops.  History of BPH Continue finasteride and Flomax.  Peripheral neuropathy Continue gabapentin  Pressure Ulcers Pressure Injury 02/09/19 Heel Right Unstageable - Full thickness tissue loss in which the base of the ulcer is covered by slough (yellow, tan, gray, green or brown) and/or eschar (tan, brown or black) in the wound bed. [place] nurse updated, this wound is un (Active)  02/09/19 1015  Location: Heel  Location Orientation: Right  Staging: Unstageable - Full thickness tissue loss in which the base of the ulcer is covered by slough (yellow, tan, gray, green or brown) and/or eschar (tan, brown or black) in the wound bed.  Wound Description (Comments): WOC nurse  updated, this wound is unstageable  Present on Admission: Yes     Pressure Injury 02/09/19 Sacrum Medial Stage II -  Partial thickness loss of dermis presenting as Curtis [name] shallow open ulcer with Curtis [name] red, pink wound bed without slough. 4cm x 5cm center  area wound with dark red wound base, periwound area with reddness and sloug (Active)  02/09/19 1015  Location: Sacrum  Location Orientation: Medial  Staging: Stage II -  Partial thickness loss of dermis presenting as Curtis [name] shallow open ulcer with Curtis [name] red, pink wound bed without slough.  Wound Description (Comments): 4cm x 5cm center area wound with dark red wound base, periwound area with reddness and sloughing of skin noted  Present on Admission: Yes     Pressure Injury 04/13/19 Heel Left Stage III -  Full thickness tissue loss. Subcutaneous fat may be visible but bone, tendon or muscle are NOT exposed. (Active)  04/13/19 2000  Location: Heel  Location Orientation: Left  Staging: Stage III -  Full thickness tissue loss. Subcutaneous fat may be visible but bone, tendon or muscle are NOT exposed.  Wound Description (Comments):   Present on Admission: Yes     Pressure Injury 04/13/19 Heel Right Stage II -  Partial thickness loss of dermis presenting as Curtis [name] shallow open ulcer with Curtis [name] red, pink wound bed without slough. (Active)  04/13/19 2000  Location: Heel  Location Orientation: Right  Staging: Stage II -  Partial thickness loss of dermis presenting as Curtis [name] shallow open ulcer with Curtis [name] red, pink wound bed without slough.  Wound Description (Comments):   Present on Admission: Yes     Pressure Injury 04/13/19 Sacrum Unstageable - Full thickness tissue loss in which the base of the ulcer is covered by slough (yellow, tan, gray, green or brown) and/or eschar (tan, brown or black) in the wound bed. (Active)  04/13/19 2000  Location: Sacrum  Location Orientation:   Staging: Unstageable - Full thickness tissue loss in which the base of the ulcer is covered by slough (yellow, tan, gray, green or brown) and/or eschar (tan, brown or black) in the wound bed.  Wound Description (Comments):   Present on Admission: Yes   DVT prophylaxis: lovenox Code Status: DNR Family Communication: called son Disposition  Plan: pending  Consultants:   none  Procedures:   none  Antimicrobials:  Anti-infectives (From admission, onward)   Start     Dose/Rate Route Frequency Ordered Stop   05/16/19 1000  remdesivir 100 mg in sodium chloride 0.9 % 100 mL IVPB  Status:  Discontinued     100 mg 200 mL/hr over 30 Minutes Intravenous Every 24 hours 05/15/19 0229 05/15/19 1341   05/15/19 2000  remdesivir 100 mg in sodium chloride 0.9 % 100 mL IVPB  Status:  Discontinued     100 mg 200 mL/hr over 30 Minutes Intravenous Daily 05/14/19 2039 05/15/19 0229   05/15/19 1800  remdesivir 100 mg in sodium chloride 0.9 % 100 mL IVPB     100 mg 200 mL/hr over 30 Minutes Intravenous Every 24 hours 05/15/19 1341 05/18/19 1831   05/14/19 2045  remdesivir 200 mg in sodium chloride 0.9% 250 mL IVPB     200 mg 580 mL/hr over 30 Minutes Intravenous Once 05/14/19 2039 05/15/19 0128     Subjective: No new complaints  Objective: Vitals:   05/18/19 0701 05/18/19 0745 05/18/19 0937 05/18/19 1530  BP:  (!) 147/67 138/84 (!) 150/80  Pulse:  80 85 88  Resp:  19  20  Temp:  (!) 97.4 F (36.3 C)  98 F (36.7 C)  TempSrc:  Oral  Oral  SpO2: 95% 93%  94%  Weight:      Height:        Intake/Output Summary (Last 24 hours) at 05/18/2019 1911 Last data filed at 05/18/2019 1801 Gross per 24 hour  Intake 740 ml  Output 1700 ml  Net -960 ml   Filed Weights   05/14/19 1712 05/15/19 0300  Weight: 83 kg 83.2 kg    Examination:  General exam: Appears calm and comfortable  Respiratory system: Clear to auscultation. Respiratory effort normal. Cardiovascular system: S1 & S2 heard, RRR Gastrointestinal system: Abdomen is nondistended, soft and nontender.  Central nervous system: Alert and oriented. No focal neurological deficits. Extremities: no LEE, dressing intact Skin: stage IV decubitus to sacrum Psychiatry: Judgement and insight appear normal. Mood & affect appropriate.     Data Reviewed: I have personally reviewed  following labs and imaging studies  CBC: Recent Labs  Lab 05/14/19 1719 05/15/19 0255 05/16/19 0330 05/17/19 0315 05/18/19 0452  WBC 12.0* 8.3 9.3 5.6 8.6  NEUTROABS 10.6* 7.3 8.4* 4.9 7.3  HGB 8.6* 8.4* 8.4* 8.8* 9.3*  HCT 28.2* 28.6* 27.9* 29.0* 30.0*  MCV 91.9 91.7 91.5 89.2 88.8  PLT 443* 440* 459* 507* [id number]*   Basic Metabolic Panel: Recent Labs  Lab 05/14/19 1719 05/15/19 0255 05/16/19 0330 05/17/19 0315 05/18/19 0452  NA 136 137 137 136 135  K 4.4 4.8 4.8 4.6 4.7  CL 96* 95* 95* 94* 96*  CO2 31 31 32 32 31  GLUCOSE 114* 154* 173* 139* 148*  BUN 16 19 32* 27* 29*  CREATININE 0.84 0.78 0.93 0.68 0.69  CALCIUM 8.8* 9.0 9.1 9.1 8.9  MG  --  1.9 2.0 1.8 1.9  PHOS  --  4.6  --   --   --    GFR: Estimated Creatinine Clearance: 88.9 mL/min (by C-G formula based on SCr of 0.69 mg/dL). Liver Function Tests: Recent Labs  Lab 05/14/19 1719 05/15/19 0255 05/16/19 0330 05/17/19 0315 05/18/19 0452  AST 18 17 14* 15 16  ALT 13 13 11 10 13   ALKPHOS 74 74 65 62 59  BILITOT 0.7 0.7 0.3 0.4 0.7  PROT 6.5 7.0 6.5 6.4* 6.4*  ALBUMIN 2.4* 2.7* 2.5* 2.5* 2.7*   No results for input(s): LIPASE, AMYLASE in the last 168 hours. No results for input(s): AMMONIA in the last 168 hours. Coagulation Profile: No results for input(s): INR, PROTIME in the last 168 hours. Cardiac Enzymes: No results for input(s): CKTOTAL, CKMB, CKMBINDEX, TROPONINI in the last 168 hours. BNP (last 3 results) No results for input(s): PROBNP in the last 8760 hours. HbA1C: No results for input(s): HGBA1C in the last 72 hours. CBG: No results for input(s): GLUCAP in the last 168 hours. Lipid Profile: No results for input(s): CHOL, HDL, LDLCALC, TRIG, CHOLHDL, LDLDIRECT in the last 72 hours. Thyroid Function Tests: No results for input(s): TSH, T4TOTAL, FREET4, T3FREE, THYROIDAB in the last 72 hours. Anemia Panel: Recent Labs    05/17/19 0315 05/18/19 0452  FERRITIN 345* 319   Sepsis  Labs: Recent Labs  Lab 05/14/19 1719 05/14/19 2014  PROCALCITON <0.10  --   LATICACIDVEN 1.6 1.0    Recent Results (from the past 240 hour(s))  Blood Culture (routine x 2)     Status: None (Preliminary result)   Collection Time: 05/14/19  8:15 PM   Specimen: BLOOD  Result Value Ref Range Status   Specimen Description BLOOD RIGHT ANTECUBITAL  Final   Special Requests   Final    BOTTLES DRAWN AEROBIC AND ANAEROBIC Blood Culture adequate volume   Culture   Final    NO GROWTH 4 DAYS Performed at [facility], [address] [place], [place] [address]    Report Status PENDING  Incomplete         Radiology Studies: No results found.      Scheduled Meds: . acetaminophen  650 mg Oral TID  . amitriptyline  100 mg Oral QHS  . amLODipine  2.5 mg Oral Daily  . vitamin C  500 mg Oral Daily  . aspirin EC  81 mg Oral Daily  . B-complex with vitamin C  1 tablet Oral Daily  . cholecalciferol  2,000 Units Oral Daily  . collagenase  1 application Topical Daily  . dexamethasone (DECADRON) injection  6 mg Intravenous Q12H  . enoxaparin  40 mg Subcutaneous Q24H  . feeding supplement (ENSURE ENLIVE)  237 mL Oral TID BM  . feeding supplement (PRO-STAT SUGAR FREE 64)  30 mL Oral BID BM  . finasteride  5 mg Oral Daily  . fluticasone  1 spray Each Nare Daily  . fluticasone furoate-vilanterol  1 puff Inhalation Daily  . gabapentin  700 mg Oral TID  . guaiFENesin  600 mg Oral BID  . Ipratropium-Albuterol  1 puff Inhalation Q6H  . latanoprost  1 drop Both Eyes QHS  . Melatonin  6 mg Oral QHS  . metoprolol tartrate  12.5 mg Oral BID  . multivitamin with minerals  1 tablet Oral Daily  . pantoprazole  40 mg Oral Daily  . saccharomyces boulardii  250 mg Oral Daily  . simvastatin  20 mg Oral Daily  . sodium hypochlorite   Topical BID  . tamsulosin  0.4 mg Oral QHS  . umeclidinium bromide  1 puff Inhalation Daily  . zinc sulfate  220 mg Oral Daily   Continuous Infusions:   LOS:  4 days    Time spent: over 30 min    Fayrene Helper, MD [facility] Pager AMION  If 7PM-7AM, please contact night-coverage [email or web address] Password [facility] 05/18/2019, 7:11 PM

## 2019-05-18 NOTE — TOC Initial Note (Signed)
Transition of Care [facility]) - Initial/Assessment Note    Patient Details  Name: Curtis [name] MRN: [id number] Date of Birth: 1945-12-03  Transition of [facility]) CM/SW Contact:    Amador Cunas, [place] Phone Number: 05/18/2019, 12:50 PM  Clinical Narrative:      [facility]. Confirmed with Judson Roch in admissions pt is able to return at d/c. Spoke to pt's son Elta Guadeloupe who reports agreeable to current dc plan. Per MD, possible dc Thursday or Friday. SW will follow.   Wandra Feinstein, MSW, LCSW [phone] (GV coverage)                Expected Discharge Plan: [place]     Patient Goals and CMS Choice        Expected Discharge Plan and Services Expected Discharge Plan: [place]                                              Prior Living Arrangements/Services   Lives with:: Facility Resident   Do you feel safe going back to the place where you live?: Yes        Care giver support system in place?: Yes (comment)      Activities of Daily Living Home Assistive Devices/Equipment: [place] Rile (specify type) ADL Screening (condition at time of admission) Patient's cognitive ability adequate to safely complete daily activities?: Yes(needs physical help) Is the patient deaf or have difficulty hearing?: No Does the patient have difficulty seeing, even when wearing glasses/contacts?: No Does the patient have difficulty concentrating, remembering, or making decisions?: Yes Patient able to express need for assistance with ADLs?: Yes Does the patient have difficulty dressing or bathing?: Yes Independently performs ADLs?: No Does the patient have difficulty walking or climbing stairs?: Yes Weakness of Legs: Both Weakness of Arms/Hands: Both  Permission Sought/Granted Permission sought to share information with : Other (comment), Family Supports([facility] admissions) Permission granted to share information with :  Yes, Verbal Permission Granted              Emotional Assessment       Orientation: : Oriented to  Time, Oriented to Situation, Oriented to Place, Oriented to Self Alcohol / Substance Use: Not Applicable Psych Involvement: No (comment)  Admission diagnosis:  Hypoxia [R09.02] Pneumonia due to COVID-19 virus [U07.1, J12.82] COVID-19 [U07.1] Patient Active Problem List   Diagnosis Date Noted  . Pneumonia due to COVID-19 virus 05/14/2019  . Closed left hip fracture ([place]) 04/13/2019  . Hip fracture ([place]) 04/13/2019  . Closed comminuted intertrochanteric fracture of proximal end of left femur ([place])   . Protein-calorie malnutrition, severe ([place]) 03/12/2019  . Chronic diastolic heart failure ([place]) 03/02/2019  . Unstageable pressure ulcer of left buttock ([place]) 02/21/2019  . Decubitus ulcer of heel, bilateral, unstageable ([place]) 02/21/2019  . Anasarca 02/20/2019  . BPH with urinary obstruction 02/20/2019  . Sacral decubitus ulcer, stage IV ([place])   . Physical deconditioning 02/04/2019  . Pressure injury of deep tissue of left heel 01/27/2019  . Atrial flutter, chronic ([place]) 01/15/2019  . Bilateral open angle glaucoma 01/15/2019  . Anxiety disorder 01/15/2019  . Pressure ulcer of right heel, stage 2 ([place]) 01/03/2019  . Chronic respiratory failure with hypoxia and hypercapnia ([place]) 01/02/2019  . Situational mixed anxiety and depressive disorder 02/28/2018  . Carotid artery disease ([place]) 02/27/2016  .  Hereditary and idiopathic peripheral neuropathy 01/30/2015  . Anemia, iron deficiency 05/08/2014  . Urinary retention 05/08/2014  . Shortness of breath   . Hyperlipidemia LDL goal <70 04/20/2014  . AAA (abdominal aortic aneurysm), stable 04/20/2014  . SOB (shortness of breath)   . Peripheral vascular disease ([place])   . Presence of drug coated stent in left circumflex coronary artery: Promus DES 3.5 mm x 38 mm (3.9 mm) 01/17/2014    Class: Acute  . CAD (coronary artery disease), native  coronary artery   . SVT (supraventricular tachycardia) s/p ablation 01/16/2014 01/16/2014  . 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  . TIA (transient ischemic attack) 09/06/2013  . HTN (hypertension) 09/05/2013  . TOBACCO ABUSE, hx 08/23/2007  . COPD with chronic bronchitis ([place]) 08/12/2007  . Asbestosis ([place]) 08/12/2007  . Reflex sympathetic dystrophy 05/29/2007  . GERD 05/29/2007   PCP:  [name], MD Pharmacy:  No Pharmacies Listed    Social Determinants of Health (SDOH) Interventions    Readmission Risk Interventions Readmission Risk Prevention Plan 02/11/2019 01/04/2019 12/19/2018  Transportation Screening Complete Complete Complete  PCP or Specialist Appt within 5-7 Days - - Complete  PCP or Specialist Appt within 3-5 Days - Complete -  Home Care Screening - - Complete  Medication Review (RN CM) - - Complete  [place] or [place] - Complete -  Social Work Consult for [place] Planning/Counseling - Complete -  Palliative Care Screening - Not Applicable -  Medication Review Press photographer) Complete Complete -  PCP or Specialist appointment within 3-5 days of discharge Not Complete - -  PCP/Specialist Appt Not Complete comments Pt returning to SNF - -  [place] or [place] Not Complete - -  [place] or [place] Pt Refusal Comments Pt returning to SNF - -  SW Recovery Care/Counseling Consult Complete - -  Palliative Care Screening Not Applicable - -  [place] Complete - -  Some recent data might be hidden

## 2019-05-18 NOTE — Progress Notes (Signed)
PHYSICAL THERAPY EVALUATION:  CLINICAL IMPRESSION: PTA was living in skilled facility states has been in skilled facilities since this summer. When asked of PLOf states was mostly w/c bound with staff assisting with ADLs and IADLs as needed. This pm pt is needing mod-max a with all mobility, he is c/o pain with transitional movements and does report having fall and L hip fx approx a month a go, therapist has not found any WB restrictions in chart review. Pt was able to get to edge of bed with mod a, able to sit supported and unsupported at times without loss of balance, sit<>stand and stand pivot to w/c with max a. Pt would benefit from skilled PT tx while in hospital but he is very close to baseline functioning, he should be able to return to home SNF when medically stable, where he should have all needed assistance as previous.    05/18/19 1200  PT Visit Information  Last PT Received On 05/18/19  Assistance Needed +2  History of Present Illness 74 year old Caucasian male with a past medical history of pulmonary asbestosis, coronary artery disease status post PCI x2, history of AVNRT status post ablation, history of COPD, chronic respiratory failure with hypoxia on 4 L of oxygen at home, essential hypertension, hyperlipidemia, GERD, peripheral artery disease who is currently in skilled nursing facility after recent surgery for hip fracture.  He presented with shortness of breath.  Found to have positive COVID-19 test result.  Chest x-ray showed bilateral opacities.  He was hospitalized for further management.  Precautions  Precautions Fall  Precaution Comments hx of fall within past month and hip fx L  Restrictions  Weight Bearing Restrictions No  Other Position/Activity Restrictions unable to find any eight bearing restrictions  Pain Assessment  Pain Assessment  (states stays in pain neck, lower back knees)  Cognition  Arousal/Alertness Awake/alert  Behavior During Therapy WFL for tasks  assessed/performed  Overall Cognitive Status No family/caregiver present to determine baseline cognitive functioning  Bed Mobility  Overal bed mobility Needs Assistance  Bed Mobility Supine to Sit;Sit to Supine  Supine to sit Min assist  Sit to supine Min assist  Transfers  Overall transfer level Needs assistance  Transfers Sit to/from Stand;Stand Pivot Transfers  Sit to Stand Max assist  Stand pivot transfers Max assist  Ambulation/Gait  General Gait Details states has not been ambulatory in some time now  Balance  Overall balance assessment History of Falls;Needs assistance  Sitting-balance support Feet supported  Sitting balance-Leahy Scale Fair  Sitting balance - Comments ssits unsupported edge of bed with no loss of balance  Standing balance support During functional activity  Standing balance-Leahy Scale Poor  PT - End of Session  Equipment Utilized During Treatment Oxygen  Activity Tolerance Treatment limited secondary to medical complications (Comment);Patient limited by pain;Patient limited by fatigue;Patient limited by lethargy  Patient left in bed;with call bell/phone within reach  Nurse Communication Mobility status   PT - Assessment/Plan  PT Visit Diagnosis Other abnormalities of gait and mobility (R26.89);Muscle weakness (generalized) (M62.81);History of falling (Z91.81)  PT Frequency (ACUTE ONLY) Min 2X/week  Follow Up Recommendations SNF  PT equipment None recommended by PT  AM-PAC PT "6 Clicks" Mobility Outcome Measure (Version 2)  Help needed turning from your back to your side while in a flat bed without using bedrails? 3  Help needed moving from lying on your back to sitting on the side of a flat bed without using bedrails? 2  Help needed moving to  and from a bed to a chair (including a wheelchair)? 2  Help needed standing up from a chair using your arms (e.g., wheelchair or bedside chair)? 2  Help needed to walk in hospital room? 1  Help needed climbing 3-5  steps with a railing?  1  6 Click Score 11  Consider Recommendation of Discharge To: CIR/SNF/LTACH  Acute Rehab PT Goals  PT Goal Formulation With patient  Time For Goal Achievement 06/01/19  Potential to Achieve Goals Fair  PT Time Calculation  PT Start Time (ACUTE ONLY) 1238  PT Stop Time (ACUTE ONLY) 1320  PT Time Calculation (min) (ACUTE ONLY) 42 min  PT General Charges  $$ ACUTE PT VISIT 1 Visit  PT Evaluation  $PT Eval Moderate Complexity 1 Mod  PT Treatments  $Therapeutic Activity 23-37 mins    Horald Chestnut, PT

## 2019-05-19 LAB — COMPREHENSIVE METABOLIC PANEL
ALT: 13 U/L (ref 0–44)
AST: 13 U/L — ABNORMAL LOW (ref 15–41)
Albumin: 2.6 g/dL — ABNORMAL LOW (ref 3.5–5.0)
Alkaline Phosphatase: 59 U/L (ref 38–126)
Anion gap: 7 (ref 5–15)
BUN: 33 mg/dL — ABNORMAL HIGH (ref 8–23)
CO2: 30 mmol/L (ref 22–32)
Calcium: 8.5 mg/dL — ABNORMAL LOW (ref 8.9–10.3)
Chloride: 96 mmol/L — ABNORMAL LOW (ref 98–111)
Creatinine, Ser: 0.67 mg/dL (ref 0.61–1.24)
GFR calc Af Amer: >60 mL/min (ref >60)
GFR calc non Af Amer: >60 mL/min (ref >60)
Glucose, Bld: 119 mg/dL — ABNORMAL HIGH (ref 70–99)
Potassium: 4.2 mmol/L (ref 3.5–5.1)
Sodium: 133 mmol/L — ABNORMAL LOW (ref 135–145)
Total Bilirubin: 0.7 mg/dL (ref 0.3–1.2)
Total Protein: 6.1 g/dL — ABNORMAL LOW (ref 6.5–8.1)

## 2019-05-19 LAB — CBC WITH DIFFERENTIAL/PLATELET
Abs Immature Granulocytes: 0.76 10*3/uL — ABNORMAL HIGH (ref 0.00–0.07)
Basophils Absolute: 0.1 10*3/uL (ref 0.0–0.1)
Basophils Relative: 0 %
Eosinophils Absolute: 0 10*3/uL (ref 0.0–0.5)
Eosinophils Relative: 0 %
HCT: 29 % — ABNORMAL LOW (ref 39.0–52.0)
Hemoglobin: 8.9 g/dL — ABNORMAL LOW (ref 13.0–17.0)
Immature Granulocytes: 4 %
Lymphocytes Relative: 7 %
Lymphs Abs: 1.2 10*3/uL (ref 0.7–4.0)
MCH: 27.3 pg (ref 26.0–34.0)
MCHC: 30.7 g/dL (ref 30.0–36.0)
MCV: 89 fL (ref 80.0–100.0)
Monocytes Absolute: 1.1 10*3/uL — ABNORMAL HIGH (ref 0.1–1.0)
Monocytes Relative: 6 %
Neutro Abs: 15 10*3/uL — ABNORMAL HIGH (ref 1.7–7.7)
Neutrophils Relative %: 83 %
Platelets: 599 10*3/uL — ABNORMAL HIGH (ref 150–400)
RBC: 3.26 MIL/uL — ABNORMAL LOW (ref 4.22–5.81)
RDW: 17.2 % — ABNORMAL HIGH (ref 11.5–15.5)
WBC: 18.2 10*3/uL — ABNORMAL HIGH (ref 4.0–10.5)
nRBC: 0.2 % (ref 0.0–0.2)

## 2019-05-19 LAB — CULTURE, BLOOD (ROUTINE X 2)
Culture: NO GROWTH
Special Requests: ADEQUATE

## 2019-05-19 LAB — PHOSPHORUS
Phosphorus: 2.4 mg/dL — ABNORMAL LOW (ref 2.5–4.6)
Phosphorus: 2.5 mg/dL (ref 2.5–4.6)

## 2019-05-19 LAB — MAGNESIUM: Magnesium: 1.9 mg/dL (ref 1.7–2.4)

## 2019-05-19 LAB — D-DIMER, QUANTITATIVE: D-Dimer, Quant: 0.79 ug/mL-FEU — ABNORMAL HIGH (ref 0.00–0.50)

## 2019-05-19 LAB — C-REACTIVE PROTEIN: CRP: 1.7 mg/dL — ABNORMAL HIGH (ref <1.0)

## 2019-05-19 LAB — FERRITIN: Ferritin: 228 ng/mL (ref 24–336)

## 2019-05-19 NOTE — Progress Notes (Signed)
Patient refused dress change, he stated that it was done at 0530. Flowsheet reflects patient statement

## 2019-05-19 NOTE — Progress Notes (Addendum)
PROGRESS NOTE    C[name]  [id number] DOB: 01/12/1946 DOA: 05/14/2019 PCP: [name], MD   Brief Narrative:  74 year old Caucasian male with [name] past medical history of pulmonary asbestosis, coronary artery disease status post PCI x2, history of AVNRT status post ablation, history of COPD, chronic respiratory failure with hypoxia on 4 L of oxygen at home, essential hypertension, hyperlipidemia, GERD, peripheral artery disease who is currently in skilled nursing facility after recent surgery for hip fracture.  He presented with shortness of breath.  Found to have positive COVID-19 test result.  Chest x-ray showed bilateral opacities.  He was hospitalized for further management.  Assessment & Plan:   Active Problems:   Pneumonia due to COVID-19 virus  Acute Hypoxic Respiratory Failure 2/2 COVID 19 Currently on about 2 L, doing well (chronically on 4 L) Continue steroids, remdesivir (complete).  S/p convalescent plasma 1/10 I/O, daily weights IS, OOB, prone as able  COVID-19 Labs  Recent Labs    05/17/19 0315 05/18/19 0452 05/19/19 0342  DDIMER 1.08* 1.01* 0.79*  FERRITIN 345* 319 228  CRP 4.6* 2.0* 1.7*    Lab Results  Component Value Date   SARSCOV2NAA NEGATIVE 04/18/2019   [place] NEGATIVE 04/13/2019   [place] NEGATIVE 02/26/2019   [place] NEGATIVE 02/08/2019   History of COPD/chronic respiratory failure with hypoxia on home oxygen Patient uses 4 L at baseline.  He is currently in [name] after he sustained left hip fracture few months ago.  Continue with his inhalers.  Otherwise see above.  Followed by Dr. [name] [name] with pulmonology.  History of coronary artery disease status post PCI/history of AVNRT status post ablation/history of atrial flutter Stable.  Followed by Dr. Harrington [name] with cardiology.  Currently not on anticoagulation.  He is on aspirin statin and beta-blocker.  Normocytic anemia Hemoglobin is at baseline.  No  evidence for overt bleeding.  Continue to monitor.  Transfuse if it drops below 7.  Hemoglobin remains stable  Peripheral artery disease Stable.  Continue to monitor.  He is noted to have ulcers in both his heels.  Patient has poor pulses (bilateral DP pulses dopplerable 1/13).  However his feet are warm to touch.  Needs ABI's.  He will also need outpatient follow-up with vascular surgery depending on his Doppler studies which can be done in the next few weeks.  Lower extremity wounds involving the bilateral heels left more than right, present on admission Wound care consult, appreciate recs. DP pulses are dopplerable These are likely pressure injuries but patient does have [name] history of PAD and so that should also be considered as possible etiology.  See above.  Continue wound care.  Stage IV sacral decubitus, present on admission Wound care to follow.  Essential hypertension Continue amlodipine and metoprolol.  Hyperlipidemia Continue simvastatin.  History of glaucoma Continue eyedrops.  History of BPH Continue finasteride and Flomax.  Peripheral neuropathy Continue gabapentin  Pressure Ulcers Pressure Injury 02/09/19 Heel Right Unstageable - Full thickness tissue loss in which the base of the ulcer is covered by slough (yellow, tan, gray, green or brown) and/or eschar (tan, brown or black) in the wound bed. [place] nurse updated, this wound is un (Active)  02/09/19 1015  Location: Heel  Location Orientation: Right  Staging: Unstageable - Full thickness tissue loss in which the base of the ulcer is covered by slough (yellow, tan, gray, green or brown) and/or eschar (tan, brown or black) in the wound bed.  Wound Description (Comments): WOC  nurse updated, this wound is unstageable  Present on Admission: Yes     Pressure Injury 02/09/19 Sacrum Medial Stage II -  Partial thickness loss of dermis presenting as Makylah Bossard shallow open ulcer with Brendy Ficek red, pink wound bed without slough. 4cm x  5cm center area wound with dark red wound base, periwound area with reddness and sloug (Active)  02/09/19 1015  Location: Sacrum  Location Orientation: Medial  Staging: Stage II -  Partial thickness loss of dermis presenting as [name] shallow open ulcer with [name] red, pink wound bed without slough.  Wound Description (Comments): 4cm x 5cm center area wound with dark red wound base, periwound area with reddness and sloughing of skin noted  Present on Admission: Yes     Pressure Injury 04/13/19 Heel Left Stage III -  Full thickness tissue loss. Subcutaneous fat may be visible but bone, tendon or muscle are NOT exposed. (Active)  04/13/19 2000  Location: Heel  Location Orientation: Left  Staging: Stage III -  Full thickness tissue loss. Subcutaneous fat may be visible but bone, tendon or muscle are NOT exposed.  Wound Description (Comments):   Present on Admission: Yes     Pressure Injury 04/13/19 Heel Right Stage II -  Partial thickness loss of dermis presenting as [name] shallow open ulcer with [name] red, pink wound bed without slough. (Active)  04/13/19 2000  Location: Heel  Location Orientation: Right  Staging: Stage II -  Partial thickness loss of dermis presenting as [name] shallow open ulcer with Mckenleigh Tarlton red, pink wound bed without slough.  Wound Description (Comments):   Present on Admission: Yes     Pressure Injury 04/13/19 Sacrum Unstageable - Full thickness tissue loss in which the base of the ulcer is covered by slough (yellow, tan, gray, green or brown) and/or eschar (tan, brown or black) in the wound bed. (Active)  04/13/19 2000  Location: Sacrum  Location Orientation:   Staging: Unstageable - Full thickness tissue loss in which the base of the ulcer is covered by slough (yellow, tan, gray, green or brown) and/or eschar (tan, brown or black) in the wound bed.  Wound Description (Comments):   Present on Admission: Yes   DVT prophylaxis: lovenox Code Status: DNR Family Communication: called son  1/14 Disposition Plan: pending  Consultants:   none  Procedures:   none  Antimicrobials:  Anti-infectives (From admission, onward)   Start     Dose/Rate Route Frequency Ordered Stop   05/16/19 1000  remdesivir 100 mg in sodium chloride 0.9 % 100 mL IVPB  Status:  Discontinued     100 mg 200 mL/hr over 30 Minutes Intravenous Every 24 hours 05/15/19 0229 05/15/19 1341   05/15/19 2000  remdesivir 100 mg in sodium chloride 0.9 % 100 mL IVPB  Status:  Discontinued     100 mg 200 mL/hr over 30 Minutes Intravenous Daily 05/14/19 2039 05/15/19 0229   05/15/19 1800  remdesivir 100 mg in sodium chloride 0.9 % 100 mL IVPB     100 mg 200 mL/hr over 30 Minutes Intravenous Every 24 hours 05/15/19 1341 05/18/19 1831   05/14/19 2045  remdesivir 200 mg in sodium chloride 0.9% 250 mL IVPB     200 mg 580 mL/hr over 30 Minutes Intravenous Once 05/14/19 2039 05/15/19 0128     Subjective: No new complaints  Objective: Vitals:   05/19/19 0331 05/19/19 0743 05/19/19 0947 05/19/19 1636  BP: (!) 148/74 (!) 149/67 (!) 143/56 121/62  Pulse: 70 73  95  Resp:  14  16  Temp: 98 F (36.7 C) 98.1 F (36.7 C)  98.3 F (36.8 C)  TempSrc: Oral Oral  Oral  SpO2: 94% 94%  93%  Weight:      Height:        Intake/Output Summary (Last 24 hours) at 05/19/2019 1805 Last data filed at 05/19/2019 1343 Gross per 24 hour  Intake 222 ml  Output 625 ml  Net -403 ml   Filed Weights   05/14/19 1712 05/15/19 0300  Weight: 83 kg 83.2 kg    Examination:  General: No acute distress. Cardiovascular: Heart sounds show Gemini Beaumier regular rate, and rhythm. Lungs: Clear to auscultation bilaterally  Abdomen: Soft, nontender, nondistended Neurological: Alert and oriented 3. Moves all extremities 4 . Cranial nerves II through XII grossly intact. Skin: Warm and dry. No rashes or lesions. Sacral decubitus not examined today, LE wounds dressed (images in epic reviewed) Extremities: No clubbing or cyanosis. No  edema.    Data Reviewed: I have personally reviewed following labs and imaging studies  CBC: Recent Labs  Lab 05/15/19 0255 05/16/19 0330 05/17/19 0315 05/18/19 0452 05/19/19 0342  WBC 8.3 9.3 5.6 8.6 18.2*  NEUTROABS 7.3 8.4* 4.9 7.3 15.0*  HGB 8.4* 8.4* 8.8* 9.3* 8.9*  HCT 28.6* 27.9* 29.0* 30.0* 29.0*  MCV 91.7 91.5 89.2 88.8 89.0  PLT 440* 459* 507* 548* [id number]*   Basic Metabolic Panel: Recent Labs  Lab 05/15/19 0255 05/16/19 0330 05/17/19 0315 05/18/19 0452 05/19/19 0342  NA 137 137 136 135 133*  K 4.8 4.8 4.6 4.7 4.2  CL 95* 95* 94* 96* 96*  CO2 31 32 32 31 30  GLUCOSE 154* 173* 139* 148* 119*  BUN 19 32* 27* 29* 33*  CREATININE 0.78 0.93 0.68 0.69 0.67  CALCIUM 9.0 9.1 9.1 8.9 8.5*  MG 1.9 2.0 1.8 1.9 1.9  PHOS 4.6  --   --   --  2.4*   GFR: Estimated Creatinine Clearance: 88.9 mL/min (by C-G formula based on SCr of 0.67 mg/dL). Liver Function Tests: Recent Labs  Lab 05/15/19 0255 05/16/19 0330 05/17/19 0315 05/18/19 0452 05/19/19 0342  AST 17 14* 15 16 13*  ALT 13 11 10 13 13   ALKPHOS 74 65 62 59 59  BILITOT 0.7 0.3 0.4 0.7 0.7  PROT 7.0 6.5 6.4* 6.4* 6.1*  ALBUMIN 2.7* 2.5* 2.5* 2.7* 2.6*   No results for input(s): LIPASE, AMYLASE in the last 168 hours. No results for input(s): AMMONIA in the last 168 hours. Coagulation Profile: No results for input(s): INR, PROTIME in the last 168 hours. Cardiac Enzymes: No results for input(s): CKTOTAL, CKMB, CKMBINDEX, TROPONINI in the last 168 hours. BNP (last 3 results) No results for input(s): PROBNP in the last 8760 hours. HbA1C: No results for input(s): HGBA1C in the last 72 hours. CBG: No results for input(s): GLUCAP in the last 168 hours. Lipid Profile: No results for input(s): CHOL, HDL, LDLCALC, TRIG, CHOLHDL, LDLDIRECT in the last 72 hours. Thyroid Function Tests: No results for input(s): TSH, T4TOTAL, FREET4, T3FREE, THYROIDAB in the last 72 hours. Anemia Panel: Recent Labs     05/18/19 0452 05/19/19 0342  FERRITIN 319 228   Sepsis Labs: Recent Labs  Lab 05/14/19 1719 05/14/19 2014  PROCALCITON <0.10  --   LATICACIDVEN 1.6 1.0    Recent Results (from the past 240 hour(s))  Blood Culture (routine x 2)     Status: None   Collection Time: 05/14/19  8:15 PM  Specimen: BLOOD  Result Value Ref Range Status   Specimen Description BLOOD RIGHT ANTECUBITAL  Final   Special Requests   Final    BOTTLES DRAWN AEROBIC AND ANAEROBIC Blood Culture adequate volume   Culture   Final    NO GROWTH 5 DAYS Performed at [facility], [address] [place], [place] [address]    Report Status 05/19/2019 FINAL  Final         Radiology Studies: No results found.      Scheduled Meds: . acetaminophen  650 mg Oral TID  . amitriptyline  100 mg Oral QHS  . amLODipine  2.5 mg Oral Daily  . vitamin C  500 mg Oral Daily  . aspirin EC  81 mg Oral Daily  . B-complex with vitamin C  1 tablet Oral Daily  . cholecalciferol  2,000 Units Oral Daily  . collagenase  1 application Topical Daily  . dexamethasone (DECADRON) injection  6 mg Intravenous Q24H  . enoxaparin  40 mg Subcutaneous Q24H  . feeding supplement (ENSURE ENLIVE)  237 mL Oral TID BM  . feeding supplement (PRO-STAT SUGAR FREE 64)  30 mL Oral BID BM  . finasteride  5 mg Oral Daily  . fluticasone  1 spray Each Nare Daily  . fluticasone furoate-vilanterol  1 puff Inhalation Daily  . gabapentin  700 mg Oral TID  . guaiFENesin  600 mg Oral BID  . Ipratropium-Albuterol  1 puff Inhalation Q6H  . latanoprost  1 drop Both Eyes QHS  . Melatonin  6 mg Oral QHS  . metoprolol tartrate  12.5 mg Oral BID  . multivitamin with minerals  1 tablet Oral Daily  . pantoprazole  40 mg Oral Daily  . saccharomyces boulardii  250 mg Oral Daily  . simvastatin  20 mg Oral Daily  . sodium hypochlorite   Topical BID  . tamsulosin  0.4 mg Oral QHS  . umeclidinium bromide  1 puff Inhalation Daily  . zinc sulfate  220 mg  Oral Daily   Continuous Infusions:   LOS: 5 days    Time spent: over 30 min    Fayrene Helper, MD [facility] Pager AMION  If 7PM-7AM, please contact night-coverage [email or web address] Password [facility] 05/19/2019, 6:05 PM

## 2019-05-19 NOTE — Progress Notes (Signed)
Patient refused 1200 repositioning, he states that he is comfortable. Patient educated on the benefits of Q2hr repositioning.

## 2019-05-20 LAB — COMPREHENSIVE METABOLIC PANEL
ALT: 11 U/L (ref 0–44)
AST: 13 U/L — ABNORMAL LOW (ref 15–41)
Albumin: 2.5 g/dL — ABNORMAL LOW (ref 3.5–5.0)
Alkaline Phosphatase: 58 U/L (ref 38–126)
Anion gap: 8 (ref 5–15)
BUN: 31 mg/dL — ABNORMAL HIGH (ref 8–23)
CO2: 28 mmol/L (ref 22–32)
Calcium: 8.5 mg/dL — ABNORMAL LOW (ref 8.9–10.3)
Chloride: 97 mmol/L — ABNORMAL LOW (ref 98–111)
Creatinine, Ser: 0.67 mg/dL (ref 0.61–1.24)
GFR calc Af Amer: >60 mL/min (ref >60)
GFR calc non Af Amer: >60 mL/min (ref >60)
Glucose, Bld: 150 mg/dL — ABNORMAL HIGH (ref 70–99)
Potassium: 4.6 mmol/L (ref 3.5–5.1)
Sodium: 133 mmol/L — ABNORMAL LOW (ref 135–145)
Total Bilirubin: 0.8 mg/dL (ref 0.3–1.2)
Total Protein: 5.8 g/dL — ABNORMAL LOW (ref 6.5–8.1)

## 2019-05-20 LAB — C-REACTIVE PROTEIN: CRP: 4.7 mg/dL — ABNORMAL HIGH (ref <1.0)

## 2019-05-20 LAB — HEMOGLOBIN A1C
Hgb A1c MFr Bld: 6.1 % — ABNORMAL HIGH (ref 4.8–5.6)
Mean Plasma Glucose: 128.37 mg/dL

## 2019-05-20 LAB — D-DIMER, QUANTITATIVE: D-Dimer, Quant: 0.53 ug/mL-FEU — ABNORMAL HIGH (ref 0.00–0.50)

## 2019-05-20 LAB — MAGNESIUM: Magnesium: 2 mg/dL (ref 1.7–2.4)

## 2019-05-20 MED ORDER — METOPROLOL TARTRATE 25 MG PO TABS: 12.5000 mg | ORAL_TABLET | Freq: Two times a day (BID) | ORAL | 0 refills | Status: AC

## 2019-05-20 MED ORDER — LIDOCAINE VISCOUS HCL 2 % MT SOLN
15.0000 mL | Freq: Four times a day (QID) | OROMUCOSAL | Status: DC | PRN
  Filled 2019-05-20: qty 15

## 2019-05-20 NOTE — TOC Transition Note (Signed)
Transition of Care [facility]) - CM/SW Discharge Note   Patient Details  Name: CHINMAY DOTHARD MRN: [id number] Date of Birth: 1945-08-08  [facility] of [facility]) CM/SW Contact:  Geralynn Ochs, LCSW Phone Number: 05/20/2019, 1:19 PM   Clinical Narrative:   Nurse to call report to [phone].    Final next level of care: Skilled Nursing Facility Barriers to Discharge: Barriers Resolved   Patient Goals and CMS Choice        Discharge Placement              Patient chooses bed at: [facility] Patient to be transferred to facility by: [place] Name of family member notified: Elta Guadeloupe Patient and family notified of of transfer: 05/20/19  Discharge Plan and Services                                     Social Determinants of Health ([place]) Interventions     Readmission Risk Interventions Readmission Risk Prevention Plan 02/11/2019 01/04/2019 12/19/2018  Transportation Screening Complete Complete Complete  PCP or Specialist Appt within 5-7 Days - - Complete  PCP or Specialist Appt within 3-5 Days - Complete -  Home Care Screening - - Complete  Medication Review (RN CM) - - Complete  [place] or [place] - Complete -  Social Work Consult for [place] Planning/Counseling - Complete -  Palliative Care Screening - Not Applicable -  Medication Review Press photographer) Complete Complete -  PCP or Specialist appointment within 3-5 days of discharge Not Complete - -  PCP/Specialist Appt Not Complete comments Pt returning to SNF - -  [place] or [place] Not Complete - -  [place] or [place] Pt Refusal Comments Pt returning to SNF - -  SW Recovery Care/Counseling Consult Complete - -  Palliative Care Screening Not Applicable - -  [place] Complete - -  Some recent data might be hidden

## 2019-05-20 NOTE — Discharge Instructions (Signed)
Person Under Monitoring Name: [name]  Location: [place] [address]   Infection Prevention Recommendations for Individuals Confirmed to have, or Being Evaluated for, 2019 Novel Coronavirus (COVID-19) Infection Who Receive Care at Home  Individuals who are confirmed to have, or are being evaluated for, COVID-19 should follow the prevention steps below until a healthcare provider or local or state health department says they can return to normal activities.  Stay home except to get medical care You should restrict activities outside your home, except for getting medical care. Do not go to work, school, or public areas, and do not use public transportation or taxis.  Call ahead before visiting your doctor Before your medical appointment, call the healthcare provider and tell them that you have, or are being evaluated for, COVID-19 infection. This will help the healthcare provider's office take steps to keep other people from getting infected. Ask your healthcare provider to call the local or state health department.  Monitor your symptoms Seek prompt medical attention if your illness is worsening (e.g., difficulty breathing). Before going to your medical appointment, call the healthcare provider and tell them that you have, or are being evaluated for, COVID-19 infection. Ask your healthcare provider to call the local or state health department.  Wear a facemask You should wear a facemask that covers your nose and mouth when you are in the same room with other people and when you visit a healthcare provider. People who live with or visit you should also wear a facemask while they are in the same room with you.  Separate yourself from other people in your home As much as possible, you should stay in a different room from other people in your home. Also, you should use a separate bathroom, if available.  Avoid sharing household items You should not  share dishes, drinking glasses, cups, eating utensils, towels, bedding, or other items with other people in your home. After using these items, you should wash them thoroughly with soap and water.  Cover your coughs and sneezes Cover your mouth and nose with a tissue when you cough or sneeze, or you can cough or sneeze into your sleeve. Throw used tissues in a lined trash can, and immediately wash your hands with soap and water for at least 20 seconds or use an alcohol-based hand rub.  Wash your [facility] your hands often and thoroughly with soap and water for at least 20 seconds. You can use an alcohol-based hand sanitizer if soap and water are not available and if your hands are not visibly dirty. Avoid touching your eyes, nose, and mouth with unwashed hands.   Prevention Steps for Caregivers and Household Members of Individuals Confirmed to have, or Being Evaluated for, COVID-19 Infection Being Cared for in the Home  If you live with, or provide care at home for, a person confirmed to have, or being evaluated for, COVID-19 infection please follow these guidelines to prevent infection:  Follow healthcare provider's instructions Make sure that you understand and can help the patient follow any healthcare provider instructions for all care.  Provide for the patient's basic needs You should help the patient with basic needs in the home and provide support for getting groceries, prescriptions, and other personal needs.  Monitor the patient's symptoms If they are getting sicker, call his or her medical provider and tell them that the patient has, or is being evaluated for, COVID-19 infection. This will help the healthcare provider's  office take steps to keep other people from getting infected. Ask the healthcare provider to call the local or state health department.  Limit the number of people who have contact with the patient  If possible, have only one caregiver for the  patient.  Other household members should stay in another home or place of residence. If this is not possible, they should stay  in another room, or be separated from the patient as much as possible. Use a separate bathroom, if available.  Restrict visitors who do not have an essential need to be in the home.  Keep older adults, very young children, and other sick people away from the patient Keep older adults, very young children, and those who have compromised immune systems or chronic health conditions away from the patient. This includes people with chronic heart, lung, or kidney conditions, diabetes, and cancer.  Ensure good ventilation Make sure that shared spaces in the home have good air flow, such as from an air conditioner or an opened window, weather permitting.  Wash your hands often  Wash your hands often and thoroughly with soap and water for at least 20 seconds. You can use an alcohol based hand sanitizer if soap and water are not available and if your hands are not visibly dirty.  Avoid touching your eyes, nose, and mouth with unwashed hands.  Use disposable paper towels to dry your hands. If not available, use dedicated cloth towels and replace them when they become wet.  Wear a facemask and gloves  Wear a disposable facemask at all times in the room and gloves when you touch or have contact with the patient's blood, body fluids, and/or secretions or excretions, such as sweat, saliva, sputum, nasal mucus, vomit, urine, or feces.  Ensure the mask fits over your nose and mouth tightly, and do not touch it during use.  Throw out disposable facemasks and gloves after using them. Do not reuse.  Wash your hands immediately after removing your facemask and gloves.  If your personal clothing becomes contaminated, carefully remove clothing and launder. Wash your hands after handling contaminated clothing.  Place all used disposable facemasks, gloves, and other waste in a lined  container before disposing them with other household waste.  Remove gloves and wash your hands immediately after handling these items.  Do not share dishes, glasses, or other household items with the patient  Avoid sharing household items. You should not share dishes, drinking glasses, cups, eating utensils, towels, bedding, or other items with a patient who is confirmed to have, or being evaluated for, COVID-19 infection.  After the person uses these items, you should wash them thoroughly with soap and water.  Wash laundry thoroughly  Immediately remove and wash clothes or bedding that have blood, body fluids, and/or secretions or excretions, such as sweat, saliva, sputum, nasal mucus, vomit, urine, or feces, on them.  Wear gloves when handling laundry from the patient.  Read and follow directions on labels of laundry or clothing items and detergent. In general, wash and dry with the warmest temperatures recommended on the label.  Clean all areas the individual has used often  Clean all touchable surfaces, such as counters, tabletops, doorknobs, bathroom fixtures, toilets, phones, keyboards, tablets, and bedside tables, every day. Also, clean any surfaces that may have blood, body fluids, and/or secretions or excretions on them.  Wear gloves when cleaning surfaces the patient has come in contact with.  Use a diluted bleach solution (e.g., dilute bleach with 1  part bleach and 10 parts water) or a household disinfectant with a label that says EPA-registered for coronaviruses. To make a bleach solution at home, add 1 tablespoon of bleach to 1 quart (4 cups) of water. For a larger supply, add  cup of bleach to 1 gallon (16 cups) of water.  Read labels of cleaning products and follow recommendations provided on product labels. Labels contain instructions for safe and effective use of the cleaning product including precautions you should take when applying the product, such as wearing gloves or  eye protection and making sure you have good ventilation during use of the product.  Remove gloves and wash hands immediately after cleaning.  Monitor yourself for signs and symptoms of illness Caregivers and household members are considered close contacts, should monitor their health, and will be asked to limit movement outside of the home to the extent possible. Follow the monitoring steps for close contacts listed on the symptom monitoring form.   ? If you have additional questions, contact your local health department or call the epidemiologist on call at [phone] (available 24/7). ? This guidance is subject to change. For the most up-to-date guidance from [facility], please refer to their website: [email or web address].pl

## 2019-05-20 NOTE — Progress Notes (Signed)
PHYSICAL THERAPY PROGRESS NOTE:   CLINICAL IMPRESSION: Pt tolerated tx slightly better than previous, still has increased pain with WB activities hence limited to brief transfer movements. Pt able to complete bed mob with min a, minimally tolerates sitting edge of bed needing UE and also feet supported, no loss of balance noted with this. Pt agreeable to sitting in recliner, able to complete sit<> stand with mod a and take small pivotal steps to recliner with min a. Pt shows increased pain with WB activities but was better able to tolerate today than previous. Therapist recommended heel wedge offloading shoes to help with WB on BLE without pressure on B Heels, pt seems as if may be more mobile and may even be able to ambulate with heels offloaded. Therapists attempted to procure shoes but was unable as they are not stocked at this facility. Pt was re-educated on importance of completing incentive spirometer and flutter valve exercises, he verbalizes understanding but carryover is to be seen.  Pt will greatly benefit from continued PT tx at post acute care level as it will allow him to progress with his independence with mobility and also activity tolerance.    05/20/19 1149  PT Visit Information  Last PT Received On 05/20/19  Assistance Needed +1  History of Present Illness 74 year old Caucasian male with a past medical history of pulmonary asbestosis, coronary artery disease status post PCI x2, history of AVNRT status post ablation, history of COPD, chronic respiratory failure with hypoxia on 4 L of oxygen at home, essential hypertension, hyperlipidemia, GERD, peripheral artery disease who is currently in skilled nursing facility after recent surgery for hip fracture.  He presented with shortness of breath.  Found to have positive COVID-19 test result.  Chest x-ray showed bilateral opacities.  He was hospitalized for further management.  Subjective Data  Patient Stated Goal did not voice goals   Precautions  Precautions Fall  Precaution Comments hx of fall within past month and hip fx L  Restrictions  Weight Bearing Restrictions No  Pain Assessment  Pain Assessment Faces  Faces Pain Scale 6  Pain Location w/ WB   Pain Descriptors / Indicators Grimacing;Guarding;Moaning  Pain Intervention(s) Limited activity within patient's tolerance;Monitored during session  Cognition  Arousal/Alertness Awake/alert  Behavior During Therapy Flat affect  Overall Cognitive Status No family/caregiver present to determine baseline cognitive functioning  General Comments seems to be slightly more alert than previous session but still w/ very flat affect  Bed Mobility  Overal bed mobility Needs Assistance  Bed Mobility Supine to Sit  Supine to sit Min assist  Transfers  Overall transfer level Needs assistance  Equipment used Rolling walker (2 wheeled)  Transfers Sit to/from [employer] Transfers  Sit to Stand Mod assist  Stand pivot transfers Min assist  General transfer comment able to stand and take small pivotal antalgic steps to chair  Balance  Overall balance assessment Needs assistance;History of Falls  Sitting-balance support Feet supported;Bilateral upper extremity supported  Sitting balance-Leahy Scale Fair  Sitting balance - Comments sits supported and unsupported EOB  Standing balance support During functional activity  Standing balance-Leahy Scale Fair  Standing balance comment w/ UE support on walker does better  Exercises  Exercises Other exercises  Other Exercises  Other Exercises reinforced use of incentive spiromenter   Other Exercises reinforced use of flutter valve  PT - End of Session  Equipment Utilized During Treatment Oxygen;Gait belt  Activity Tolerance Patient limited by pain;Patient limited by fatigue;Treatment limited secondary to  medical complications (Comment)  Patient left in chair;with call bell/phone within reach;with chair alarm set  Nurse  Communication Mobility status;Other (comment) (post tx disposition, wounds and WB shoe recommendation)   PT - Assessment/Plan  PT Plan Current plan remains appropriate  PT Visit Diagnosis Other abnormalities of gait and mobility (R26.89);Muscle weakness (generalized) (M62.81);History of falling (Z91.81)  PT Frequency (ACUTE ONLY) Min 2X/week  Follow Up Recommendations SNF  PT equipment Other (comment) (d/t wounds on B heels, heel wedge offloading shoe)  AM-PAC PT "6 Clicks" Mobility Outcome Measure (Version 2)  Help needed turning from your back to your side while in a flat bed without using bedrails? 3  Help needed moving from lying on your back to sitting on the side of a flat bed without using bedrails? 3  Help needed moving to and from a bed to a chair (including a wheelchair)? 2  Help needed standing up from a chair using your arms (e.g., wheelchair or bedside chair)? 3  Help needed to walk in hospital room? 1  Help needed climbing 3-5 steps with a railing?  1  6 Click Score 13  Consider Recommendation of Discharge To: CIR/SNF/LTACH  PT Goal Progression  Progress towards PT goals Progressing toward goals  Acute Rehab PT Goals  PT Goal Formulation With patient  Time For Goal Achievement 06/01/19  Potential to Achieve Goals Fair  PT Time Calculation  PT Start Time (ACUTE ONLY) 1130  PT Stop Time (ACUTE ONLY) 1149  PT Time Calculation (min) (ACUTE ONLY) 19 min  PT General Charges  $$ ACUTE PT VISIT 1 Visit  PT Treatments  $Therapeutic Activity 8-22 mins   Horald Chestnut, PT

## 2019-05-20 NOTE — Progress Notes (Signed)
Report called to [facility] and given to [employer]

## 2019-05-20 NOTE — Care Management Important Message (Signed)
Important Message  Patient Details  Name: Curtis [name] MRN: [id number] Date of Birth: 1945-07-20   Medicare Important Message Given:  Yes - Important Message mailed due to current National Emergency  Verbal consent obtained due to current National Emergency  Relationship to patient: Other Real[employer] (must comment) Contact Name: Hoke Fromm Call Date: 05/20/19  Time: 1356 Phone: [phone] Outcome: Spoke with contact Important Message mailed to: Patient address on file    Delorse Lek 05/20/2019, 1:57 PM

## 2019-05-20 NOTE — Discharge Summary (Signed)
Physician Discharge Summary  Curtis [name] [id number] DOB: 18-Feb-1946 DOA: 05/14/2019  PCP: Curtis [place], MD  Admit date: 05/14/2019 Discharge date: 05/20/2019  Time spent: 40 minutes  Recommendations for Outpatient Follow-up:  1. Follow outpatient CBC/CMP 2. Continue quarantine until 1/30 per CDC guidelines 3. Needs close follow up for wound care - bilateral ankles and sacrum  4. Please obtain ABI's as an outpatient and consider vascular surgery follow up outpatient 5. Follow oxygen requirement outpatient  Discharge Diagnoses:  Active Problems:   Pneumonia due to COVID-19 virus   Discharge Condition: stable  Diet recommendation: heart healthy  Filed Weights   05/14/19 1712 05/15/19 0300  Weight: 83 kg 83.2 kg   History of present illness:  74 year old Caucasian male with [facility] [name] past medical history of pulmonary asbestosis, coronary artery disease status post PCI x2, history of AVNRT status post ablation, history of COPD, chronic respiratory failure with hypoxia on 4 L of oxygen at home, essential hypertension, hyperlipidemia, GERD, peripheral artery disease who is currently in skilled nursing facility after recent surgery for hip fracture. He presented with shortness of breath. Found to have positive COVID-19 test result. Chest x-ray showed bilateral opacities. He was hospitalized for further management.  He was admitted for shortness of breath and diagnosed with COVID 19 virus infection.  He was treated with steroids, remdesivir, and plasma.  He's improved.  Plan for discharge on 05/20/19 back to facility.   Hospital Course:  Acute Hypoxic Respiratory Failure 2/2 COVID 19 Currently on about 2 L, doing well (chronically on 4 L) Received steroids, remdesivir.  S/p convalescent plasma 1/10 I/O, daily weights IS, OOB, prone as able  COVID-19 Labs  Recent Labs    05/18/19 0452 05/19/19 0342 05/20/19 0259  DDIMER 1.01* 0.79* 0.53*  FERRITIN 319 228  --   CRP 2.0* 1.7*  4.7*    Lab Results  Component Value Date   SARSCOV2NAA NEGATIVE 04/18/2019   [place] NEGATIVE 04/13/2019   [place] NEGATIVE 02/26/2019   [place] NEGATIVE 02/08/2019   History of COPD/chronic respiratory failure with hypoxia on home oxygen Patient uses 4 L at baseline. He is currently in [name] skilled nursing facility after he sustained left hip fracture few months ago. Continue with his inhalers. Otherwise see above. Followed by Curtis [name] with pulmonology.  History of coronary artery disease status post PCI/history of AVNRT status post ablation/history of atrial flutter Stable. Followed by Curtis [name] with cardiology. Currently not on anticoagulation. He is on aspirin statin and beta-blocker.  Normocytic anemia Hemoglobin is at baseline. No evidence for overt bleeding. Continue to monitor. Transfuse if it drops below 7. Hemoglobin remains stable  Peripheral artery disease Stable. Continue to monitor. He is noted to have ulcers in both his heels. Patient has poor pulses (bilateral DP pulses dopplerable 1/13). However his feet are warm to touch. Needs ABI's.  He will also need outpatient follow-up with vascular surgery depending on his Doppler studies which can be done in the next few weeks.  Lower extremity wounds involving the bilateral heels left more than right, present on admission Wound care consult, appreciate recs. DP pulses are dopplerable These are likely pressure injuries but patient does have Curtis [name] history of PAD and so that should also be considered as possible etiology.See above.Continue wound care. Prevalon boots Therapy recommended darco shoes today  Stage IV sacral decubitus, present on admission Wound care to follow.  Wound care here as below, appears prior to admission patient was being treated with santyl.  Can  resume prior to admission wound care, but needs close follow up.  Dressing procedure/placement/frequency:  Place gauze  moistened with Dakin's solution into the sacral wound and over the LEFT heel and achilles wound. Cover with dry gauze, tape in place. Iodine to right heel each shift. Bilateral Prevalon boots. Turn patient avoid placing on the sacrum. Monitor the wound area(s) for worsening of condition such as: Signs/symptoms of infection,  Increase in size,  Development of or worsening of odor, Development of pain, or increased pain at the affected locations.  Notify the medical team if any of these develop.  Essential hypertension Continue amlodipine and metoprolol.  Hyperlipidemia Continue simvastatin.  History of glaucoma Continue eyedrops.  History of BPH Continue finasteride and Flomax.  Peripheral neuropathy Continue gabapentin  Pressure Ulcers  Procedures:  none  Consultations:  none  Discharge Exam: Vitals:   05/20/19 0745 05/20/19 0929  BP: (!) 144/69 (!) 129/58  Pulse: 74   Resp: 14   Temp: 98.5 F (36.9 C)   SpO2: 95%    No complaints today Ready for discharge.  Discussed with daughter in law and son today. Discussed plan with family.   General: No acute distress. Cardiovascular: Heart sounds show Curtis [name] regular rate, and rhythm.  Lungs: Clear to auscultation bilaterally Abdomen: Soft, nontender, nondistended Neurological: Alert and oriented 3. Moves all extremities 4 . Cranial nerves II through XII grossly intact. Skin: Warm and dry. No rashes or lesions. Extremities: No clubbing or cyanosis. No edema.  Discharge Instructions   Discharge Instructions    Call MD for:  difficulty breathing, headache or visual disturbances   Complete by: As directed    Call MD for:  extreme fatigue   Complete by: As directed    Call MD for:  hives   Complete by: As directed    Call MD for:  persistant dizziness or light-headedness   Complete by: As directed    Call MD for:  persistant nausea and vomiting   Complete by: As directed    Call MD for:  redness, tenderness,  or signs of infection (pain, swelling, redness, odor or green/yellow discharge around incision site)   Complete by: As directed    Call MD for:  severe uncontrolled pain   Complete by: As directed    Call MD for:  temperature >100.4   Complete by: As directed    Change dressing   Complete by: As directed    Diet - low sodium heart healthy   Complete by: As directed    Discharge instructions   Complete by: As directed    You were seen for Monalisa Bayless COVID 19 infection.  You've improved with steroids and remdesivir and plasma.    You will need to quarantine until 06/04/19.  Your quarantine can be discontinued after this date if your symptoms continue to be improved without fever reducing medications.  You will need to follow up wound care outpatient.  Continue wound care at facility.  It will be extremely important to follow up the wounds to your ankles and sacrum as an outpatient.  You should follow up outpatient arterial studies as an outpatient and you may need to consider following up with vascular, ask your PCP regarding this.  Return for new, recurrent, or worsening symptoms.  Please ask your PCP to request records from this hospitalization so they know what was done and what the next steps will be.   Increase activity slowly   Complete by: As directed    MyChart  COVID-19 home monitoring program   Complete by: May 20, 2019    Is the patient willing to use the [facility] for home monitoring?: Yes   Temperature monitoring   Complete by: May 20, 2019    After how many days would you like to receive Cannie Muckle notification of this patient's flowsheet entries?: 1     Allergies as of 05/20/2019      Reactions   Codeine Itching   Trileptal [oxcarbazepine] Other (See Comments)   Abnormal sodium levels Hyponatremia      Medication List    STOP taking these medications   amoxicillin-clavulanate 875-125 MG tablet Commonly known as: AUGMENTIN   predniSONE 20 MG tablet Commonly known as:  DELTASONE     TAKE these medications   acetaminophen 325 MG tablet Commonly known as: TYLENOL Take 650 mg by mouth 3 (three) times daily.   albuterol 108 (90 Base) MCG/ACT inhaler Commonly known as: VENTOLIN HFA 2 puffs every 4 - 6 hours as needed. This is your rescue inhaler What changed:   how much to take  how to take this  when to take this  reasons to take this  additional instructions   ALPRAZolam 1 MG tablet Commonly known as: XANAX Take 1 tablet (1 mg total) by mouth 2 (two) times daily as needed for anxiety or sleep.   amitriptyline 100 MG tablet Commonly known as: ELAVIL Take 1 tablet (100 mg total) by mouth at bedtime.   amLODipine 2.5 MG tablet Commonly known as: NORVASC Take 2.5 mg by mouth daily.   aspirin EC 81 MG tablet Take 81 mg by mouth daily.   atorvastatin 20 MG tablet Commonly known as: LIPITOR Take 20 mg by mouth at bedtime.   b complex vitamins tablet Take 1 tablet by mouth daily.   bisacodyl 5 MG EC tablet Commonly known as: DULCOLAX Take 5 mg by mouth daily as needed for mild constipation or moderate constipation.   budesonide-formoterol 160-4.5 MCG/ACT inhaler Commonly known as: SYMBICORT Inhale 2 puffs into the lungs 2 (two) times daily.   camphor-menthol lotion Commonly known as: SARNA Apply 1 application topically 2 (two) times daily as needed for itching.   docusate sodium 100 MG capsule Commonly known as: COLACE Take 100 mg by mouth daily.   enoxaparin 40 MG/0.4ML injection Commonly known as: LOVENOX Inject 0.4 mLs (40 mg total) into the skin daily.   feeding supplement (PRO-STAT SUGAR FREE 64) Liqd Take 30 mLs by mouth 2 (two) times daily between meals.   finasteride 5 MG tablet Commonly known as: PROSCAR Take 1 tablet (5 mg total) by mouth every morning. What changed: when to take this   fluticasone 50 MCG/ACT nasal spray Commonly known as: FLONASE Place 1 spray into both nostrils daily.   gabapentin 300 MG  capsule Commonly known as: NEURONTIN Take 300 mg by mouth 3 (three) times daily. Take with Juwan Vences 400 mg capsule for Lezley Bedgood total dose of 700 mg three times daily What changed: Another medication with the same name was removed. Continue taking this medication, and follow the directions you see here.   gabapentin 400 MG capsule Commonly known as: NEURONTIN Take 400 mg by mouth 3 (three) times daily. Take with Derald Lorge 300 mg capsule for Fuad Forget total dose of 700 mg three times daily What changed: Another medication with the same name was removed. Continue taking this medication, and follow the directions you see here.   guaiFENesin 600 MG 12 hr tablet Commonly known as:  MUCINEX Take 600 mg by mouth 2 (two) times daily.   latanoprost 0.005 % ophthalmic solution Commonly known as: XALATAN Place 1 drop into both eyes at bedtime.   Melatonin 3 MG Tabs Take 6 mg by mouth at bedtime.   metoprolol tartrate 25 MG tablet Commonly known as: LOPRESSOR Take 0.5 tablets (12.5 mg total) by mouth 2 (two) times daily. What changed: how much to take   multivitamin with minerals tablet Take 1 tablet by mouth daily.   nitroGLYCERIN 0.4 MG SL tablet Commonly known as: NITROSTAT Place 1 tablet (0.4 mg total) under the tongue every 5 (five) minutes x 3 doses as needed for chest pain.   omeprazole 40 MG capsule Commonly known as: PRILOSEC Take 40 mg by mouth daily before breakfast.   ondansetron 4 MG tablet Commonly known as: ZOFRAN Take 4 mg by mouth every 6 (six) hours as needed for nausea or vomiting.   OVER THE COUNTER MEDICATION Take 1 tablet by mouth daily. Therems Multivitamin with folic acid   OXYGEN Place 4 L/min into the nose continuous.   saccharomyces boulardii 250 MG capsule Commonly known as: FLORASTOR Take 250 mg by mouth daily.   Santyl ointment Generic drug: collagenase Apply 1 application topically daily. Apply to left buttock wound and bilateral heel ulcerations. What changed:   when to  take this  additional instructions   tamsulosin 0.4 MG Caps capsule Commonly known as: FLOMAX Take 1 capsule (0.4 mg total) by mouth at bedtime.   Trelegy Ellipta 200-62.5-25 MCG/INH Aepb Generic drug: Fluticasone-Umeclidin-Vilant Inhale 1 puff into the lungs daily.   Venelex Oint Apply 1 application topically See admin instructions. Apply topically to coccyx, sacrum, bilateral buttocks each shift as needed for itching/rash   vitamin C 1000 MG tablet Take 1,000 mg by mouth daily.   Vitamin D 50 MCG (2000 UT) tablet Take 2,000 Units by mouth daily.   Zinc 50 MG Tabs Take 50 mg by mouth daily.            Discharge Care Instructions  (From admission, onward)         Start     Ordered   05/20/19 0000  Change dressing     05/20/19 1132         Allergies  Allergen Reactions  . Codeine Itching  . Trileptal [Oxcarbazepine] Other (See Comments)    Abnormal sodium levels Hyponatremia   Contact information for after-discharge care    Destination    [facility] .   Service: Skilled Nursing Contact information: [address] [place] [phone]               The results of significant diagnostics from this hospitalization (including imaging, microbiology, ancillary and laboratory) are listed below for reference.    Significant Diagnostic Studies: DG Chest Port 1 View  Result Date: 05/14/2019 CLINICAL DATA:  Shortness of breath in Cherryl Babin patient who is COVID-19 positive. EXAM: PORTABLE CHEST 1 VIEW COMPARISON:  Single-view of the chest 04/13/2019. FINDINGS: Extensive, patchy bilateral airspace disease has progressed since the prior examination. Calcified pleural plaques and pleural thickening in the right chest are again seen. Heart size is enlarged. Aortic atherosclerosis. IMPRESSION: Worsening of patchy bilateral airspace disease most consistent with pneumonia. Calcified pleural plaques and  pleural scarring along the lateral right chest wall. Cardiomegaly. Atherosclerosis. Electronically Signed   By: Inge Rise M.D.   On: 05/14/2019 17:42    Microbiology: Recent  Results (from the past 240 hour(s))  Blood Culture (routine x 2)     Status: None   Collection Time: 05/14/19  8:15 PM   Specimen: BLOOD  Result Value Ref Range Status   Specimen Description BLOOD RIGHT ANTECUBITAL  Final   Special Requests   Final    BOTTLES DRAWN AEROBIC AND ANAEROBIC Blood Culture adequate volume   Culture   Final    NO GROWTH 5 DAYS Performed at [facility], [address], [place], [place] [address]    Report Status 05/19/2019 FINAL  Final     Labs: Basic Metabolic Panel: Recent Labs  Lab 05/15/19 0255 05/15/19 0255 05/16/19 0330 05/17/19 0315 05/18/19 0452 05/19/19 0342 05/19/19 2028 05/20/19 0259  NA 137   < > 137 136 135 133*  --  133*  K 4.8   < > 4.8 4.6 4.7 4.2  --  4.6  CL 95*   < > 95* 94* 96* 96*  --  97*  CO2 31   < > 32 32 31 30  --  28  GLUCOSE 154*   < > 173* 139* 148* 119*  --  150*  BUN 19   < > 32* 27* 29* 33*  --  31*  CREATININE 0.78   < > 0.93 0.68 0.69 0.67  --  0.67  CALCIUM 9.0   < > 9.1 9.1 8.9 8.5*  --  8.5*  MG 1.9   < > 2.0 1.8 1.9 1.9  --  2.0  PHOS 4.6  --   --   --   --  2.4* 2.5  --    < > = values in this interval not displayed.   Liver Function Tests: Recent Labs  Lab 05/16/19 0330 05/17/19 0315 05/18/19 0452 05/19/19 0342 05/20/19 0259  AST 14* 15 16 13* 13*  ALT 11 10 13 13 11   ALKPHOS 65 62 59 59 58  BILITOT 0.3 0.4 0.7 0.7 0.8  PROT 6.5 6.4* 6.4* 6.1* 5.8*  ALBUMIN 2.5* 2.5* 2.7* 2.6* 2.5*   No results for input(s): LIPASE, AMYLASE in the last 168 hours. No results for input(s): AMMONIA in the last 168 hours. CBC: Recent Labs  Lab 05/15/19 0255 05/16/19 0330 05/17/19 0315 05/18/19 0452 05/19/19 0342  WBC 8.3 9.3 5.6 8.6 18.2*  NEUTROABS 7.3 8.4* 4.9 7.3 15.0*  HGB 8.4* 8.4* 8.8* 9.3* 8.9*  HCT 28.6* 27.9*  29.0* 30.0* 29.0*  MCV 91.7 91.5 89.2 88.8 89.0  PLT 440* 459* 507* 548* 599*   Cardiac Enzymes: No results for input(s): CKTOTAL, CKMB, CKMBINDEX, TROPONINI in the last 168 hours. BNP: BNP (last 3 results) Recent Labs    01/01/19 2327 04/13/19 0027  BNP 885.0* 755.0*    ProBNP (last 3 results) No results for input(s): PROBNP in the last 8760 hours.  CBG: No results for input(s): GLUCAP in the last 168 hours.     Signed:  Fayrene Helper MD.  [facility] 05/20/2019, 11:35 AM

## 2019-07-25 ENCOUNTER — Ambulatory Visit: Payer: Medicare Other | Admitting: Internal Medicine

## 2019-08-11 ENCOUNTER — Other Ambulatory Visit: Payer: Self-pay | Admitting: Internal Medicine

## 2019-09-24 ENCOUNTER — Inpatient Hospital Stay (HOSPITAL_COMMUNITY)
Admission: EM | Admit: 2019-09-24 | Discharge: 2019-09-30 | DRG: 478 | Disposition: A | Discharge status: Home / Self Care | Payer: Medicare Other | Attending: Internal Medicine | Admitting: Internal Medicine

## 2019-09-24 ENCOUNTER — Other Ambulatory Visit: Payer: Self-pay

## 2019-09-24 ENCOUNTER — Encounter (HOSPITAL_COMMUNITY): Payer: Self-pay | Admitting: Emergency Medicine

## 2019-09-24 ENCOUNTER — Emergency Department (HOSPITAL_COMMUNITY): Payer: Medicare Other

## 2019-09-24 DIAGNOSIS — Z888 Allergy status to other drugs, medicaments and biological substances status: Secondary | ICD-10-CM

## 2019-09-24 DIAGNOSIS — W1839XA Other fall on same level, initial encounter: Secondary | ICD-10-CM | POA: Diagnosis present

## 2019-09-24 DIAGNOSIS — F419 Anxiety disorder, unspecified: Secondary | ICD-10-CM | POA: Diagnosis present

## 2019-09-24 DIAGNOSIS — D509 Iron deficiency anemia, unspecified: Secondary | ICD-10-CM | POA: Diagnosis present

## 2019-09-24 DIAGNOSIS — L8990 Pressure ulcer of unspecified site, unspecified stage: Secondary | ICD-10-CM | POA: Insufficient documentation

## 2019-09-24 DIAGNOSIS — K589 Irritable bowel syndrome without diarrhea: Secondary | ICD-10-CM | POA: Diagnosis present

## 2019-09-24 DIAGNOSIS — S22089A Unspecified fracture of T11-T12 vertebra, initial encounter for closed fracture: Secondary | ICD-10-CM | POA: Diagnosis not present

## 2019-09-24 DIAGNOSIS — Z7951 Long term (current) use of inhaled steroids: Secondary | ICD-10-CM

## 2019-09-24 DIAGNOSIS — I252 Old myocardial infarction: Secondary | ICD-10-CM

## 2019-09-24 DIAGNOSIS — Y92009 Unspecified place in unspecified non-institutional (private) residence as the place of occurrence of the external cause: Secondary | ICD-10-CM

## 2019-09-24 DIAGNOSIS — Y92002 Bathroom of unspecified non-institutional (private) residence single-family (private) house as the place of occurrence of the external cause: Secondary | ICD-10-CM

## 2019-09-24 DIAGNOSIS — K219 Gastro-esophageal reflux disease without esophagitis: Secondary | ICD-10-CM | POA: Diagnosis present

## 2019-09-24 DIAGNOSIS — G9050 Complex regional pain syndrome I, unspecified: Secondary | ICD-10-CM | POA: Diagnosis present

## 2019-09-24 DIAGNOSIS — R296 Repeated falls: Secondary | ICD-10-CM | POA: Diagnosis present

## 2019-09-24 DIAGNOSIS — Z79899 Other long term (current) drug therapy: Secondary | ICD-10-CM

## 2019-09-24 DIAGNOSIS — I5032 Chronic diastolic (congestive) heart failure: Secondary | ICD-10-CM | POA: Diagnosis present

## 2019-09-24 DIAGNOSIS — I2510 Atherosclerotic heart disease of native coronary artery without angina pectoris: Secondary | ICD-10-CM | POA: Diagnosis present

## 2019-09-24 DIAGNOSIS — Z7982 Long term (current) use of aspirin: Secondary | ICD-10-CM

## 2019-09-24 DIAGNOSIS — R262 Difficulty in walking, not elsewhere classified: Secondary | ICD-10-CM | POA: Diagnosis present

## 2019-09-24 DIAGNOSIS — I110 Hypertensive heart disease with heart failure: Secondary | ICD-10-CM | POA: Diagnosis present

## 2019-09-24 DIAGNOSIS — F329 Major depressive disorder, single episode, unspecified: Secondary | ICD-10-CM | POA: Diagnosis present

## 2019-09-24 DIAGNOSIS — J449 Chronic obstructive pulmonary disease, unspecified: Secondary | ICD-10-CM | POA: Diagnosis present

## 2019-09-24 DIAGNOSIS — Z87891 Personal history of nicotine dependence: Secondary | ICD-10-CM

## 2019-09-24 DIAGNOSIS — S22000A Wedge compression fracture of unspecified thoracic vertebra, initial encounter for closed fracture: Secondary | ICD-10-CM

## 2019-09-24 DIAGNOSIS — Z9981 Dependence on supplemental oxygen: Secondary | ICD-10-CM

## 2019-09-24 DIAGNOSIS — Y9301 Activity, walking, marching and hiking: Secondary | ICD-10-CM | POA: Diagnosis present

## 2019-09-24 DIAGNOSIS — H409 Unspecified glaucoma: Secondary | ICD-10-CM | POA: Diagnosis present

## 2019-09-24 DIAGNOSIS — I739 Peripheral vascular disease, unspecified: Secondary | ICD-10-CM | POA: Diagnosis present

## 2019-09-24 DIAGNOSIS — W19XXXA Unspecified fall, initial encounter: Secondary | ICD-10-CM | POA: Diagnosis present

## 2019-09-24 DIAGNOSIS — Z9861 Coronary angioplasty status: Secondary | ICD-10-CM

## 2019-09-24 DIAGNOSIS — M545 Low back pain: Secondary | ICD-10-CM | POA: Diagnosis not present

## 2019-09-24 DIAGNOSIS — L89621 Pressure ulcer of left heel, stage 1: Secondary | ICD-10-CM | POA: Diagnosis present

## 2019-09-24 DIAGNOSIS — J61 Pneumoconiosis due to asbestos and other mineral fibers: Secondary | ICD-10-CM | POA: Diagnosis present

## 2019-09-24 DIAGNOSIS — Z885 Allergy status to narcotic agent status: Secondary | ICD-10-CM

## 2019-09-24 DIAGNOSIS — E785 Hyperlipidemia, unspecified: Secondary | ICD-10-CM | POA: Diagnosis present

## 2019-09-24 DIAGNOSIS — G629 Polyneuropathy, unspecified: Secondary | ICD-10-CM | POA: Diagnosis present

## 2019-09-24 DIAGNOSIS — J4489 Other specified chronic obstructive pulmonary disease: Secondary | ICD-10-CM | POA: Diagnosis present

## 2019-09-24 DIAGNOSIS — R269 Unspecified abnormalities of gait and mobility: Secondary | ICD-10-CM | POA: Diagnosis present

## 2019-09-24 DIAGNOSIS — N179 Acute kidney failure, unspecified: Secondary | ICD-10-CM | POA: Diagnosis present

## 2019-09-24 DIAGNOSIS — Z20822 Contact with and (suspected) exposure to covid-19: Secondary | ICD-10-CM | POA: Diagnosis present

## 2019-09-24 DIAGNOSIS — I714 Abdominal aortic aneurysm, without rupture: Secondary | ICD-10-CM | POA: Diagnosis present

## 2019-09-24 DIAGNOSIS — J9611 Chronic respiratory failure with hypoxia: Secondary | ICD-10-CM | POA: Diagnosis present

## 2019-09-24 MED ORDER — METHYLPREDNISOLONE SODIUM SUCC 125 MG IJ SOLR
125.0000 mg | Freq: Once | INTRAMUSCULAR | Status: AC
  Administered 2019-09-24: 125 mg via INTRAVENOUS
  Filled 2019-09-24: qty 2

## 2019-09-24 MED ORDER — ALBUTEROL SULFATE HFA 108 (90 BASE) MCG/ACT IN AERS
4.0000 | INHALATION_SPRAY | Freq: Once | RESPIRATORY_TRACT | Status: AC
  Administered 2019-09-24: 6 via RESPIRATORY_TRACT
  Filled 2019-09-24: qty 6.7

## 2019-09-24 MED ORDER — LIDOCAINE 5 % EX PTCH: 1.0000 | MEDICATED_PATCH | CUTANEOUS | 0 refills | Status: AC

## 2019-09-24 NOTE — Discharge Instructions (Signed)
Your x-rays of your back this evening shows compression fractures of the #11 and 12 thoracic vertebrae.

## 2019-09-24 NOTE — ED Triage Notes (Signed)
Pt C/O back pain after a fall this afternoon. Pt was walking with walker and fell backwards onto the floor. Denies LOC. Reports hitting head. Pt alert and oriented x 4.

## 2019-09-24 NOTE — ED Provider Notes (Signed)
[facility] EMERGENCY DEPARTMENT Provider Note   CSN: [id number] Arrival date & time: 09/24/19  1911     History Chief Complaint  Patient presents with  . Fall    Curtis [name] is a 74 y.o. male past medical history significant for stable abdominal aortic aneurysm, CAD, hypertension, NSTEMI, PVD, pulmonary asbestosis presenting to emergency department today via EMS with chief complaint of mechanical fall. Patient states he was walking with his walker when he fell backwards onto the floor forgetting his walker stuck on the bathroom rug.  He states he was on the ground for approximately 30 minutes before EMS arrived after he had his life alert.  He denies loss of consciousness.  He is reporting pain in his head, neck, back, hips.  Patient states he lives alone but his son is next door and checks on him often.  He states his pain has progressively worsened since the fall.  He states pain is worse with movement.  He rates pain 7 out of 10 in severity.  He denies any visual changes, chest pain, abdominal pain, nausea, vomiting, urinary symptoms, diarrhea, numbness, weakness, decreased sensation, urinary incontinence.  Patient denies anticoagulation use.   Past Medical History:  Diagnosis Date  . AAA (abdominal aortic aneurysm), stable 04/20/2014  . Arthritis   . 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 CAD (coronary artery disease)    a. s/p cath in 2015 showing occluded RCA and high-grade LCx stenosis treated with DESx2 to LCx  . Cancer ([place])   . Depression   . Diverticulitis    hospital 2011 [facility]  . Diverticulosis   . GERD (gastroesophageal reflux disease)   . 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 Hernia of unspecified site of abdominal cavity without mention of obstruction or gangrene    hiatal  . Hyperlipidemia LDL goal <70 04/20/2014  . Hypertension   . IBS (irritable bowel syndrome)   . Myocardial infarction ([place]) 01/2014   NSTEMI  . Peripheral vascular disease (HCC)    ankle brachial index of 0.79 on  the right and 0.61 on the left.   . Pneumonia   . 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[place])   . Reflex sympathetic dystrophy   . 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 SOB (shortness of breath)     Patient Active Problem List   Diagnosis Date Noted  . Pneumonia due to COVID-19 virus 05/14/2019  . Closed left hip fracture ([place]) 04/13/2019  . Hip fracture ([place]) 04/13/2019  . Closed comminuted intertrochanteric fracture of proximal end of left femur ([place])   . Protein-calorie malnutrition, severe ([place]) 03/12/2019  . Chronic diastolic heart failure ([place]) 03/02/2019  . Unstageable pressure ulcer of left buttock ([place]) 02/21/2019  . Decubitus ulcer of heel, bilateral, unstageable ([place]) 02/21/2019  . Anasarca 02/20/2019  . BPH with urinary obstruction 02/20/2019  . Sacral decubitus ulcer, stage IV ([place])   . Physical deconditioning 02/04/2019  . Pressure injury of deep tissue of left heel 01/27/2019  . Atrial flutter, chronic ([place]) 01/15/2019  . Bilateral open angle glaucoma 01/15/2019  . Anxiety disorder 01/15/2019  . Pressure ulcer of right heel, stage 2 ([place]) 01/03/2019  . Chronic respiratory failure with hypoxia and hypercapnia ([place]) 01/02/2019  . Situational mixed anxiety and depressive disorder 02/28/2018  . Carotid artery disease ([place]) 02/27/2016  . Hereditary and idiopathic peripheral neuropathy 01/30/2015  . Anemia, iron deficiency 05/08/2014  . Urinary retention 05/08/2014  . Shortness of breath   . Hyperlipidemia LDL goal <70 04/20/2014  .  AAA (abdominal aortic aneurysm), stable 04/20/2014  . SOB (shortness of breath)   . Peripheral vascular disease ([place])   . Presence of drug coated stent in left circumflex coronary artery: Promus DES 3.5 mm x 38 mm (3.9 mm) 01/17/2014    Class: Acute  . CAD (coronary artery disease), native coronary artery   . SVT (supraventricular tachycardia) s/p ablation 01/16/2014 01/16/2014  . 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  .  TIA (transient ischemic attack) 09/06/2013  . HTN (hypertension) 09/05/2013  . TOBACCO ABUSE, hx 08/23/2007  . COPD with chronic bronchitis ([place]) 08/12/2007  . Asbestosis ([place]) 08/12/2007  . Reflex sympathetic dystrophy 05/29/2007  . GERD 05/29/2007    Past Surgical History:  Procedure Laterality Date  . CARDIAC CATHETERIZATION    . CATARACT EXTRACTION    . CORONARY ANGIOPLASTY    . HERNIA REPAIR    . INTRAMEDULLARY (IM) NAIL INTERTROCHANTERIC Left 04/14/2019   Procedure: INTRAMEDULLARY (IM) NAIL INTERTROCHANTRIC;  Surgeon: Rod Can, MD;  Location: [facility];  Service: Orthopedics;  Laterality: Left;  . LEFT HEART CATHETERIZATION WITH CORONARY ANGIOGRAM N/A 01/17/2014   Procedure: LEFT HEART CATHETERIZATION WITH CORONARY ANGIOGRAM;  Surgeon: Leonie Man, MD;  Location[facility] C[facility] LAB;  Service: Cardiovascular;  Laterality: N/A;  . left shoulder      x 3  . NISSEN FUNDOPLICATION    . right elbow surgery     x 2  . right knee arthroscopy    . squamous cell skin cancer     Left Hand  . SUPRAVENTRICULAR TACHYCARDIA ABLATION N/A 01/16/2014   Procedure: SUPRAVENTRICULAR TACHYCARDIA ABLATION;  Surgeon: Evans Lance, MD;  Location: [facility] CATH LAB;  Service: Cardiovascular;  Laterality: N/A;       Family History  Problem Relation Age of Onset  . Colon cancer Mother   . Breast cancer Sister 10  . Colon cancer Maternal Aunt   . Colon cancer Maternal Uncle   . Heart attack Neg Hx   . Stroke Neg Hx     Social History   Tobacco Use  . Smoking status: Former Smoker    Packs/day: 2.50    Years: 50.00    Pack years: 125.00    Types: Cigarettes    Quit date: 05/05/2005    Years since quitting: 14.3  . Smokeless tobacco: Former Systems developer    Types: [place] date: 05/03/2012  Substance Use Topics  . Alcohol use: No    Alcohol/week: 0.0 standard drinks    Comment: last use 2013  . Drug use: No    Home Medications Prior to Admission medications   Medication Sig Start Date End  Date Taking? Authorizing Provider  acetaminophen (TYLENOL) 325 MG tablet Take 650 mg by mouth 3 (three) times daily.    [provider]  albuterol (VENTOLIN HFA) 108 (90 Base) MCG/ACT inhaler 2 puffs every 4 - 6 hours as needed. This is your rescue inhaler Patient taking differently: Inhale 1 puff into the lungs every 4 (four) hours as needed for wheezing or shortness of breath.  05/05/19   Margaretha Seeds, MD  ALPRAZolam [name]) 1 MG tablet Take 1 tablet (1 mg total) by mouth 2 (two) times daily as needed for anxiety or sleep. 04/19/19   Mikhail, Velta Addison, DO  Amino Acids-Protein Hydrolys (FEEDING SUPPLEMENT, PRO-STAT SUGAR FREE 64,) LIQD Take 30 mLs by mouth 2 (two) times daily between meals.     [provider]  amitriptyline (ELAVIL) 100 MG tablet Take  1 tablet (100 mg total) by mouth at bedtime. 03/29/19   Gerlene Fee, NP  amLODipine (NORVASC) 2.5 MG tablet Take 2.5 mg by mouth daily.    [provider]  Ascorbic Acid (VITAMIN C) 1000 MG tablet Take 1,000 mg by mouth daily.    [provider]  aspirin EC 81 MG tablet Take 81 mg by mouth daily.    [provider]  atorvastatin (LIPITOR) 20 MG tablet Take 20 mg by mouth at bedtime.    [provider]  b complex vitamins tablet Take 1 tablet by mouth daily.    [provider]  Janne Lab Oil [facility]) OINT Apply 1 application topically See admin instructions. Apply topically to coccyx, sacrum, bilateral buttocks each shift as needed for itching/rash    [provider]  bisacodyl (DULCOLAX) 5 MG EC tablet Take 5 mg by mouth daily as needed for mild constipation or moderate constipation.    [provider]  budesonide-formoterol (SYMBICORT) 160-4.5 MCG/ACT inhaler Inhale 2 puffs into the lungs 2 (two) times daily. Patient not taking: Reported on 05/14/2019 03/29/19   Gerlene Fee, NP  camphor-menthol [facility]) lotion Apply 1 application topically 2 (two)  times daily as needed for itching.    [provider]  Cholecalciferol (VITAMIN D) 50 MCG (2000 UT) tablet Take 2,000 Units by mouth daily.    [provider]  collagenase (SANTYL) ointment Apply 1 application topically daily. Apply to left buttock wound and bilateral heel ulcerations. Patient taking differently: Apply 1 application topically See admin instructions. Apply thin layer topically to right and left heel once daily; cover with non-adhesive pad, gauze wrap 03/29/19   Gerlene Fee, NP  docusate sodium (COLACE) 100 MG capsule Take 100 mg by mouth daily.     [provider]  enoxaparin (LOVENOX) 40 MG/0.4ML injection Inject 0.4 mLs (40 mg total) into the skin daily. Patient not taking: Reported on 05/14/2019 04/16/19 05/16/19  Rod Can, MD  finasteride (PROSCAR) 5 MG tablet Take 1 tablet (5 mg total) by mouth every morning. Patient taking differently: Take 5 mg by mouth daily.  03/29/19   Gerlene Fee, NP  fluticasone (FLONASE) 50 MCG/ACT nasal spray Place 1 spray into both nostrils daily.    [provider]  Fluticasone-Umeclidin-Vilant (TRELEGY ELLIPTA) 200-62.5-25 MCG/INH AEPB Inhale 1 puff into the lungs daily. 05/05/19   Margaretha Seeds, MD  gabapentin (NEURONTIN) 300 MG capsule Take 300 mg by mouth 3 (three) times daily. Take with a 400 mg capsule for a total dose of 700 mg three times daily    [provider]  gabapentin (NEURONTIN) 400 MG capsule Take 400 mg by mouth 3 (three) times daily. Take with a 300 mg capsule for a total dose of 700 mg three times daily    [provider]  guaiFENesin (MUCINEX) 600 MG 12 hr tablet Take 600 mg by mouth 2 (two) times daily.     [provider]  latanoprost (XALATAN) 0.005 % ophthalmic solution Place 1 drop into both eyes at bedtime. 03/29/19   Gerlene Fee, NP  Melatonin 3 MG TABS Take 6 mg by mouth at bedtime.    [provider]  metoprolol tartrate  (LOPRESSOR) 25 MG tablet Take 0.5 tablets (12.5 mg total) by mouth 2 (two) times daily. 05/20/19 06/19/19  Elodia Florence., MD  Multiple Vitamins-Minerals (MULTIVITAMIN WITH MINERALS) tablet Take 1 tablet by mouth daily. Patient not taking: Reported on 05/14/2019 04/19/19  Mikhail, Velta Addison, DO  nitroGLYCERIN (NITROSTAT) 0.4 MG SL tablet Place 1 tablet (0.4 mg total) under the tongue every 5 (five) minutes x 3 doses as needed for chest pain. 03/29/19   Gerlene Fee, NP  omeprazole (PRILOSEC) 40 MG capsule Take 40 mg by mouth daily before breakfast.    [provider]  ondansetron (ZOFRAN) 4 MG tablet Take 4 mg by mouth every 6 (six) hours as needed for nausea or vomiting.    [provider]  OVER THE COUNTER MEDICATION Take 1 tablet by mouth daily. Therems Multivitamin with folic acid    [provider]  OXYGEN Place 4 L/min into the nose continuous.     [provider]  saccharomyces boulardii (FLORASTOR) 250 MG capsule Take 250 mg by mouth daily.    [provider]  tamsulosin (FLOMAX) 0.4 MG CAPS capsule Take 1 capsule (0.4 mg total) by mouth at bedtime. 03/29/19   Gerlene Fee, NP  Zinc 50 MG TABS Take 50 mg by mouth daily.    [provider]  lamoTRIgine (LAMICTAL) 100 MG tablet Take 1 tablet (100 mg total) by mouth 2 (two) times daily. 01/23/16 01/29/16  Kathrynn Ducking, MD    Allergies    Codeine and Trileptal [oxcarbazepine]  Review of Systems   Review of Systems All other systems are reviewed and are negative for acute change except as noted in the HPI.  Physical Exam Updated Vital Signs BP 117/61   Pulse 94   Temp 98.8 F (37.1 C) (Oral)   Resp 20   Ht 6' (1.829 m)   Wt 81.6 kg   SpO2 94%   BMI 24.41 kg/m   Physical Exam Vitals and nursing note reviewed.  Constitutional:      Appearance: He is well-developed. He is not ill-appearing or toxic-appearing.  HENT:     Head: Normocephalic and atraumatic.      Comments: No tenderness to palpation of skull. No deformities or crepitus noted. No open wounds, abrasions or lacerations.    Nose: Nose normal.  Eyes:     General: No scleral icterus.       Right eye: No discharge.        Left eye: No discharge.     Conjunctiva/sclera: Conjunctivae normal.     Comments: No pain with EOMs  Neck:     Vascular: No JVD.  Cardiovascular:     Rate and Rhythm: Normal rate and regular rhythm.     Pulses: Normal pulses.     Heart sounds: Normal heart sounds.  Pulmonary:     Breath sounds: Normal breath sounds.     Comments: Patient is tachypneic.  Lung sounds heard throughout.  Patient is speaking in short sentences.  Oxygen saturation on 4 L is 84% during exam. Chest:     Chest wall: No tenderness.  Abdominal:     General: There is no distension.  Musculoskeletal:        General: Normal range of motion.     Cervical back: Normal range of motion.  Skin:    General: Skin is warm and dry.  Neurological:     Mental Status: He is oriented to person, place, and time.     GCS: GCS eye subscore is 4. GCS verbal subscore is 5. GCS motor subscore is 6.     Coordination: Abnormal coordination:       Comments: Fluent speech, no facial droop.  Psychiatric:  Behavior: Behavior normal.     ED Results / Procedures / Treatments   Labs (all labs ordered are listed, but only abnormal results are displayed) Labs Reviewed  CBC WITH DIFFERENTIAL/PLATELET  BASIC METABOLIC PANEL  CK    EKG None  Radiology DG Chest 2 View  Result Date: 09/24/2019 CLINICAL DATA:  Pain status post fall EXAM: CHEST - 2 VIEW COMPARISON:  May 14, 2019 FINDINGS: Again noted are persistent diffuse patchy bilateral airspace opacification. December prior study and is favored be secondary to a combination atelectasis, scarring, and pleural based plaques. There is cardiomegaly. There are atherosclerotic changes of the thoracic aorta. There is no pneumothorax. IMPRESSION: No acute  cardiopulmonary process. Stable cardiomegaly and bilateral calcified pleural based plaques as seen on the patient's prior CTs. Electronically Signed   By: Constance Holster M.D.   On: 09/24/2019 21:16   DG Thoracic Spine 2 View  Result Date: 09/24/2019 CLINICAL DATA:  Pain status post fall EXAM: LUMBAR SPINE - COMPLETE 4+ VIEW; THORACIC SPINE 2 VIEWS COMPARISON:  None. FINDINGS: For the purpose of this report, there are rudimentary ribs at the T12 level. There is probable there is an age-indeterminate compression fracture of the T12 vertebral body resulting in approximately 25-30% height loss. This fracture is new since 02/11/2019. There appears to be some mild height loss of the T11 vertebral body which is new since CT dated 02/11/2019. Evaluation of the mid upper thoracic spine is limited by osteopenia and overlapping osseous structures. Advanced vascular calcifications are noted throughout the abdominal aorta with an infrarenal abdominal aortic aneurysm measuring up to approximately 3.4 cm. There is diffuse osteopenia throughout the visualized portions of the thoracolumbar spine with multilevel degenerative changes and facet arthrosis, especially at the lower lumbar segments. IMPRESSION: 1. Age-indeterminate compression fractures of the T11 and T12 vertebral bodies, new since 2020. 2. The midthoracic spine is suboptimally evaluated secondary to osteopenia and overlapping structures. 3. Infrarenal abdominal aortic aneurysm measuring up to approximately 3.4 cm. Recommend followup by ultrasound in 3 years. This recommendation follows ACR consensus guidelines: White Paper of the ACR Incidental Findings Committee II on Vascular Findings. J Am Coll Radiol 2013; 10:789-794 Aortic Atherosclerosis (ICD10-I70.0). Electronically Signed   By: Constance Holster M.D.   On: 09/24/2019 21:15   DG Lumbar Spine Complete  Result Date: 09/24/2019 CLINICAL DATA:  Pain status post fall EXAM: LUMBAR SPINE - COMPLETE 4+ VIEW;  THORACIC SPINE 2 VIEWS COMPARISON:  None. FINDINGS: For the purpose of this report, there are rudimentary ribs at the T12 level. There is probable there is an age-indeterminate compression fracture of the T12 vertebral body resulting in approximately 25-30% height loss. This fracture is new since 02/11/2019. There appears to be some mild height loss of the T11 vertebral body which is new since CT dated 02/11/2019. Evaluation of the mid upper thoracic spine is limited by osteopenia and overlapping osseous structures. Advanced vascular calcifications are noted throughout the abdominal aorta with an infrarenal abdominal aortic aneurysm measuring up to approximately 3.4 cm. There is diffuse osteopenia throughout the visualized portions of the thoracolumbar spine with multilevel degenerative changes and facet arthrosis, especially at the lower lumbar segments. IMPRESSION: 1. Age-indeterminate compression fractures of the T11 and T12 vertebral bodies, new since 2020. 2. The midthoracic spine is suboptimally evaluated secondary to osteopenia and overlapping structures. 3. Infrarenal abdominal aortic aneurysm measuring up to approximately 3.4 cm. Recommend followup by ultrasound in 3 years. This recommendation follows ACR consensus guidelines: White Paper of the ACR  Incidental Findings Committee II on Vascular Findings. J Am Coll Radiol 2013; 10:789-794 Aortic Atherosclerosis (ICD10-I70.0). Electronically Signed   By: Constance Holster M.D.   On: 09/24/2019 21:15   CT Head Wo Contrast  Result Date: 09/24/2019 CLINICAL DATA:  Status post fall with trauma to the head. EXAM: CT HEAD WITHOUT CONTRAST CT CERVICAL SPINE WITHOUT CONTRAST TECHNIQUE: Multidetector CT imaging of the head and cervical spine was performed following the standard protocol without intravenous contrast. Multiplanar CT image reconstructions of the cervical spine were also generated. COMPARISON:  August 20, 2017 FINDINGS: CT HEAD FINDINGS Brain: No  evidence of acute infarction, hemorrhage, hydrocephalus, extra-axial collection or mass lesion/mass effect. Advanced brain parenchymal volume loss and deep white matter microangiopathy Vascular: Intracranial calcific atherosclerotic disease. Skull: Normal. Negative for fracture or focal lesion. Sinuses/Orbits: No acute finding. Other: None. CT CERVICAL SPINE FINDINGS Alignment: Normal. Skull base and vertebrae: No acute fracture. No primary bone lesion or focal pathologic process. Soft tissues and spinal canal: No prevertebral fluid or swelling. No visible canal hematoma. Disc levels: Multilevel osteoarthritic changes of the cervical spine. Upper chest: Right supraclavicular and superior mediastinal lymphadenopathy. Other: Linear and nodular pleural thickening and calcifications in the bilateral lung apices. Interstitial thickening. IMPRESSION: 1. No acute intracranial abnormality. 2. Advanced brain parenchymal atrophy and chronic microvascular disease. 3. No evidence of acute traumatic injury to cervical spine. 4. Multilevel osteoarthritic changes of the cervical spine. 5. Bulky right supraclavicular and superior mediastinal lymphadenopathy. Please correlate clinically. 6. Linear and nodular pleural thickening and calcifications in the bilateral lung apices. Interstitial thickening. Electronically Signed   By: Fidela Salisbury M.D.   On: 09/24/2019 20:50   CT Cervical Spine Wo Contrast  Result Date: 09/24/2019 CLINICAL DATA:  Status post fall with trauma to the head. EXAM: CT HEAD WITHOUT CONTRAST CT CERVICAL SPINE WITHOUT CONTRAST TECHNIQUE: Multidetector CT imaging of the head and cervical spine was performed following the standard protocol without intravenous contrast. Multiplanar CT image reconstructions of the cervical spine were also generated. COMPARISON:  August 20, 2017 FINDINGS: CT HEAD FINDINGS Brain: No evidence of acute infarction, hemorrhage, hydrocephalus, extra-axial collection or mass  lesion/mass effect. Advanced brain parenchymal volume loss and deep white matter microangiopathy Vascular: Intracranial calcific atherosclerotic disease. Skull: Normal. Negative for fracture or focal lesion. Sinuses/Orbits: No acute finding. Other: None. CT CERVICAL SPINE FINDINGS Alignment: Normal. Skull base and vertebrae: No acute fracture. No primary bone lesion or focal pathologic process. Soft tissues and spinal canal: No prevertebral fluid or swelling. No visible canal hematoma. Disc levels: Multilevel osteoarthritic changes of the cervical spine. Upper chest: Right supraclavicular and superior mediastinal lymphadenopathy. Other: Linear and nodular pleural thickening and calcifications in the bilateral lung apices. Interstitial thickening. IMPRESSION: 1. No acute intracranial abnormality. 2. Advanced brain parenchymal atrophy and chronic microvascular disease. 3. No evidence of acute traumatic injury to cervical spine. 4. Multilevel osteoarthritic changes of the cervical spine. 5. Bulky right supraclavicular and superior mediastinal lymphadenopathy. Please correlate clinically. 6. Linear and nodular pleural thickening and calcifications in the bilateral lung apices. Interstitial thickening. Electronically Signed   By: Fidela Salisbury M.D.   On: 09/24/2019 20:50   DG Hips Bilat W or Wo Pelvis 3-4 Views  Result Date: 09/24/2019 CLINICAL DATA:  Pain status post fall EXAM: DG HIP (WITH OR WITHOUT PELVIS) 3-4V BILAT COMPARISON:  December 9th 2020. FINDINGS: The patient has undergone prior intramedullary nail placement through the left femur. The hardware is intact. There is no evidence for an acute displaced  fracture or dislocation. Atherosclerotic changes are noted. IMPRESSION: No acute displaced fracture or dislocation. Electronically Signed   By: Constance Holster M.D.   On: 09/24/2019 21:17    Procedures Procedures (including critical care time)  Medications Ordered in ED Medications    albuterol (VENTOLIN HFA) 108 (90 Base) MCG/ACT inhaler 4-6 puff (6 puffs Inhalation Given 09/24/19 2231)  methylPREDNISolone sodium succinate (SOLU-MEDROL) 125 mg/2 mL injection 125 mg (125 mg Intravenous Given 09/24/19 2231)    ED Course  I have reviewed the triage vital signs and the nursing notes.  Pertinent labs & imaging results that were available during my care of the patient were reviewed by me and considered in my medical decision making (see chart for details).    MDM Rules/Calculators/A&P                      History provided by patient with additional history obtained from chart review.    74 year old male presents after mechanical fall.  Patient presents awake, alert, hemodynamically stable, afebrile, non toxic.  He was hypoxic to 84% on his chronic 4 L.  I hear lung sounds in all fields.  He does appear to be tachypneic.  No wheezing, rales, rhonchi.  On exam he has tenderness to palpation of bilateral paraspinal muscles of cervical spine, no midline cervical spine tenderness.  He has tenderness to palpation of spinous processes of thoracic and lumbar spine, no step offs, deformity or crepitus.  He reports pain to pelvis and has tenderness to palpation of bilateral hips.  No evidence of leg length discrepancies or internal rotation.  DP pulses 2+ bilaterally.  I viewed patient's images.  Head CT is negative for acute abnormality, no signs of bleeding or skull fracture.  CT cervical spine shows no acute traumatic injuries.  Chest x-ray shows no acute cardiopulmonary process.  X-ray of thoracic spine shows age-indeterminate compression fractures of the T11 and T12 vertebral bodies, new since 2020. Also shows infrarenal abdominal aortic aneurysm measuring up to approximately 3.4 cm.,  With recommendation of follow-up ultrasound in 3 years.  X-ray of hips show no fracture or dislocation. When discussing results with patient and his son at the bedside found him to be hypoxic to 77% on his  4 L.  There is good waveform and pleth.  He is also mildly tachycardic to 103.  Examination by ED attending Dr. Roderic Palau who agrees with plan to treat hypoxia with albuterol and steroids.  Patient care transferred to T. Triplett PA-C at the end of my shift pending reassessment.. Patient presentation, ED course, and plan of care discussed with review of all pertinent labs and imaging. Please see her note for further details regarding further ED course and disposition.   Portions of this note were generated with [employer]. Dictation errors may occur despite best attempts at proofreading.  Final Clinical Impression(s) / ED Diagnoses Final diagnoses:  Fall, initial encounter    Rx / DC Orders ED Discharge Orders    None       Flint Melter 09/24/19 2236    Milton Ferguson, MD 09/25/19 [id number]

## 2019-09-25 DIAGNOSIS — K589 Irritable bowel syndrome without diarrhea: Secondary | ICD-10-CM | POA: Diagnosis present

## 2019-09-25 DIAGNOSIS — K219 Gastro-esophageal reflux disease without esophagitis: Secondary | ICD-10-CM | POA: Diagnosis present

## 2019-09-25 DIAGNOSIS — Y92002 Bathroom of unspecified non-institutional (private) residence single-family (private) house as the place of occurrence of the external cause: Secondary | ICD-10-CM | POA: Diagnosis not present

## 2019-09-25 DIAGNOSIS — I739 Peripheral vascular disease, unspecified: Secondary | ICD-10-CM | POA: Diagnosis present

## 2019-09-25 DIAGNOSIS — J9611 Chronic respiratory failure with hypoxia: Secondary | ICD-10-CM | POA: Diagnosis present

## 2019-09-25 DIAGNOSIS — I2510 Atherosclerotic heart disease of native coronary artery without angina pectoris: Secondary | ICD-10-CM | POA: Diagnosis present

## 2019-09-25 DIAGNOSIS — Z9981 Dependence on supplemental oxygen: Secondary | ICD-10-CM | POA: Diagnosis not present

## 2019-09-25 DIAGNOSIS — I714 Abdominal aortic aneurysm, without rupture: Secondary | ICD-10-CM | POA: Diagnosis present

## 2019-09-25 DIAGNOSIS — N179 Acute kidney failure, unspecified: Secondary | ICD-10-CM | POA: Diagnosis present

## 2019-09-25 DIAGNOSIS — E785 Hyperlipidemia, unspecified: Secondary | ICD-10-CM | POA: Diagnosis present

## 2019-09-25 DIAGNOSIS — J61 Pneumoconiosis due to asbestos and other mineral fibers: Secondary | ICD-10-CM | POA: Diagnosis present

## 2019-09-25 DIAGNOSIS — W19XXXD Unspecified fall, subsequent encounter: Secondary | ICD-10-CM | POA: Diagnosis not present

## 2019-09-25 DIAGNOSIS — M545 Low back pain: Secondary | ICD-10-CM | POA: Diagnosis present

## 2019-09-25 DIAGNOSIS — H409 Unspecified glaucoma: Secondary | ICD-10-CM | POA: Diagnosis present

## 2019-09-25 DIAGNOSIS — L89621 Pressure ulcer of left heel, stage 1: Secondary | ICD-10-CM | POA: Diagnosis present

## 2019-09-25 DIAGNOSIS — G9050 Complex regional pain syndrome I, unspecified: Secondary | ICD-10-CM | POA: Diagnosis present

## 2019-09-25 DIAGNOSIS — J449 Chronic obstructive pulmonary disease, unspecified: Secondary | ICD-10-CM | POA: Diagnosis present

## 2019-09-25 DIAGNOSIS — F419 Anxiety disorder, unspecified: Secondary | ICD-10-CM | POA: Diagnosis present

## 2019-09-25 DIAGNOSIS — Y92009 Unspecified place in unspecified non-institutional (private) residence as the place of occurrence of the external cause: Secondary | ICD-10-CM | POA: Diagnosis not present

## 2019-09-25 DIAGNOSIS — I110 Hypertensive heart disease with heart failure: Secondary | ICD-10-CM | POA: Diagnosis present

## 2019-09-25 DIAGNOSIS — W19XXXA Unspecified fall, initial encounter: Secondary | ICD-10-CM | POA: Diagnosis present

## 2019-09-25 DIAGNOSIS — R296 Repeated falls: Secondary | ICD-10-CM | POA: Diagnosis present

## 2019-09-25 DIAGNOSIS — Y9301 Activity, walking, marching and hiking: Secondary | ICD-10-CM | POA: Diagnosis present

## 2019-09-25 DIAGNOSIS — I5032 Chronic diastolic (congestive) heart failure: Secondary | ICD-10-CM | POA: Diagnosis present

## 2019-09-25 DIAGNOSIS — Z20822 Contact with and (suspected) exposure to covid-19: Secondary | ICD-10-CM | POA: Diagnosis present

## 2019-09-25 DIAGNOSIS — R262 Difficulty in walking, not elsewhere classified: Secondary | ICD-10-CM | POA: Diagnosis not present

## 2019-09-25 DIAGNOSIS — G629 Polyneuropathy, unspecified: Secondary | ICD-10-CM | POA: Diagnosis present

## 2019-09-25 DIAGNOSIS — W1839XA Other fall on same level, initial encounter: Secondary | ICD-10-CM | POA: Diagnosis present

## 2019-09-25 DIAGNOSIS — F329 Major depressive disorder, single episode, unspecified: Secondary | ICD-10-CM | POA: Diagnosis present

## 2019-09-25 DIAGNOSIS — S22000A Wedge compression fracture of unspecified thoracic vertebra, initial encounter for closed fracture: Secondary | ICD-10-CM | POA: Diagnosis not present

## 2019-09-25 DIAGNOSIS — S22089A Unspecified fracture of T11-T12 vertebra, initial encounter for closed fracture: Secondary | ICD-10-CM | POA: Diagnosis present

## 2019-09-25 DIAGNOSIS — D509 Iron deficiency anemia, unspecified: Secondary | ICD-10-CM | POA: Diagnosis present

## 2019-09-25 DIAGNOSIS — R269 Unspecified abnormalities of gait and mobility: Secondary | ICD-10-CM | POA: Diagnosis present

## 2019-09-25 DIAGNOSIS — F411 Generalized anxiety disorder: Secondary | ICD-10-CM | POA: Diagnosis not present

## 2019-09-25 LAB — COMPREHENSIVE METABOLIC PANEL
ALT: 12 U/L (ref 0–44)
AST: 19 U/L (ref 15–41)
Albumin: 3.2 g/dL — ABNORMAL LOW (ref 3.5–5.0)
Alkaline Phosphatase: 101 U/L (ref 38–126)
Anion gap: 12 (ref 5–15)
BUN: 22 mg/dL (ref 8–23)
CO2: 32 mmol/L (ref 22–32)
Calcium: 10.4 mg/dL — ABNORMAL HIGH (ref 8.9–10.3)
Chloride: 96 mmol/L — ABNORMAL LOW (ref 98–111)
Creatinine, Ser: 1.59 mg/dL — ABNORMAL HIGH (ref 0.61–1.24)
GFR calc Af Amer: 48 mL/min — ABNORMAL LOW (ref >60)
GFR calc non Af Amer: 42 mL/min — ABNORMAL LOW (ref >60)
Glucose, Bld: 122 mg/dL — ABNORMAL HIGH (ref 70–99)
Potassium: 5.1 mmol/L (ref 3.5–5.1)
Sodium: 140 mmol/L (ref 135–145)
Total Bilirubin: 0.4 mg/dL (ref 0.3–1.2)
Total Protein: 8.5 g/dL — ABNORMAL HIGH (ref 6.5–8.1)

## 2019-09-25 LAB — CBC WITH DIFFERENTIAL/PLATELET
Abs Immature Granulocytes: 0.11 10*3/uL — ABNORMAL HIGH (ref 0.00–0.07)
Basophils Absolute: 0.1 10*3/uL (ref 0.0–0.1)
Basophils Relative: 1 %
Eosinophils Absolute: 0.1 10*3/uL (ref 0.0–0.5)
Eosinophils Relative: 1 %
HCT: 36.3 % — ABNORMAL LOW (ref 39.0–52.0)
Hemoglobin: 10.7 g/dL — ABNORMAL LOW (ref 13.0–17.0)
Immature Granulocytes: 1 %
Lymphocytes Relative: 4 %
Lymphs Abs: 0.5 10*3/uL — ABNORMAL LOW (ref 0.7–4.0)
MCH: 26 pg (ref 26.0–34.0)
MCHC: 29.5 g/dL — ABNORMAL LOW (ref 30.0–36.0)
MCV: 88.1 fL (ref 80.0–100.0)
Monocytes Absolute: 0.7 10*3/uL (ref 0.1–1.0)
Monocytes Relative: 6 %
Neutro Abs: 10.6 10*3/uL — ABNORMAL HIGH (ref 1.7–7.7)
Neutrophils Relative %: 87 %
Platelets: 349 10*3/uL (ref 150–400)
RBC: 4.12 MIL/uL — ABNORMAL LOW (ref 4.22–5.81)
RDW: 14.9 % (ref 11.5–15.5)
WBC: 12.1 10*3/uL — ABNORMAL HIGH (ref 4.0–10.5)
nRBC: 0 % (ref 0.0–0.2)

## 2019-09-25 LAB — SARS CORONAVIRUS 2 BY RT PCR (HOSPITAL ORDER, PERFORMED IN ~~LOC~~ HOSPITAL LAB): SARS Coronavirus 2: NEGATIVE

## 2019-09-25 LAB — CBG MONITORING, ED: Glucose-Capillary: 157 mg/dL — ABNORMAL HIGH (ref 70–99)

## 2019-09-25 LAB — CREATININE, URINE, RANDOM: Creatinine, Urine: 163.02 mg/dL

## 2019-09-25 LAB — SODIUM, URINE, RANDOM: Sodium, Ur: 24 mmol/L

## 2019-09-25 MED ORDER — METOPROLOL TARTRATE 25 MG PO TABS
12.5000 mg | ORAL_TABLET | Freq: Two times a day (BID) | ORAL | Status: DC
  Administered 2019-09-25 – 2019-09-30 (×11): 12.5 mg via ORAL
  Filled 2019-09-25 (×12): qty 1

## 2019-09-25 MED ORDER — AMITRIPTYLINE HCL 25 MG PO TABS
100.0000 mg | ORAL_TABLET | Freq: Every day | ORAL | Status: DC
  Administered 2019-09-25 – 2019-09-29 (×5): 100 mg via ORAL
  Filled 2019-09-25 (×5): qty 4

## 2019-09-25 MED ORDER — HEPARIN SODIUM (PORCINE) 5000 UNIT/ML IJ SOLN
5000.0000 [IU] | Freq: Three times a day (TID) | INTRAMUSCULAR | Status: AC
  Administered 2019-09-25 – 2019-09-28 (×12): 5000 [IU] via SUBCUTANEOUS
  Filled 2019-09-25 (×12): qty 1

## 2019-09-25 MED ORDER — ACETAMINOPHEN 325 MG PO TABS
650.0000 mg | ORAL_TABLET | Freq: Four times a day (QID) | ORAL | Status: DC | PRN
  Administered 2019-09-25: 650 mg via ORAL
  Filled 2019-09-25 (×2): qty 2

## 2019-09-25 MED ORDER — ASPIRIN EC 81 MG PO TBEC
81.0000 mg | DELAYED_RELEASE_TABLET | Freq: Every day | ORAL | Status: DC
  Administered 2019-09-25 – 2019-09-30 (×6): 81 mg via ORAL
  Filled 2019-09-25 (×6): qty 1

## 2019-09-25 MED ORDER — LACTATED RINGERS IV SOLN: INTRAVENOUS | Status: DC

## 2019-09-25 MED ORDER — HYDROCODONE-ACETAMINOPHEN 5-325 MG PO TABS
1.0000 | ORAL_TABLET | Freq: Once | ORAL | Status: AC
  Administered 2019-09-25: 1 via ORAL
  Filled 2019-09-25: qty 1

## 2019-09-25 MED ORDER — FENTANYL CITRATE (PF) 100 MCG/2ML IJ SOLN: 50.0000 ug | INTRAMUSCULAR | Status: DC | PRN

## 2019-09-25 MED ORDER — HYDROCODONE-ACETAMINOPHEN 5-325 MG PO TABS
1.0000 | ORAL_TABLET | ORAL | Status: DC | PRN
  Administered 2019-09-25 – 2019-09-26 (×2): 1 via ORAL
  Administered 2019-09-26: 2 via ORAL
  Administered 2019-09-26 – 2019-09-27 (×3): 1 via ORAL
  Administered 2019-09-27 – 2019-09-28 (×2): 2 via ORAL
  Administered 2019-09-28: 1 via ORAL
  Administered 2019-09-29 – 2019-09-30 (×4): 2 via ORAL
  Filled 2019-09-25: qty 1
  Filled 2019-09-25: qty 2
  Filled 2019-09-25: qty 1
  Filled 2019-09-25 (×2): qty 2
  Filled 2019-09-25 (×2): qty 1
  Filled 2019-09-25: qty 2
  Filled 2019-09-25: qty 1
  Filled 2019-09-25 (×3): qty 2
  Filled 2019-09-25: qty 1

## 2019-09-25 MED ORDER — ONDANSETRON HCL 4 MG PO TABS: 4.0000 mg | ORAL_TABLET | Freq: Four times a day (QID) | ORAL | Status: DC | PRN

## 2019-09-25 MED ORDER — GABAPENTIN 400 MG PO CAPS
800.0000 mg | ORAL_CAPSULE | Freq: Three times a day (TID) | ORAL | Status: DC
  Administered 2019-09-25 – 2019-09-26 (×4): 800 mg via ORAL
  Filled 2019-09-25 (×4): qty 2

## 2019-09-25 MED ORDER — ONDANSETRON HCL 4 MG/2ML IJ SOLN: 4.0000 mg | Freq: Four times a day (QID) | INTRAMUSCULAR | Status: DC | PRN

## 2019-09-25 MED ORDER — LATANOPROST 0.005 % OP SOLN
1.0000 [drp] | Freq: Every day | OPHTHALMIC | Status: DC
  Administered 2019-09-25 – 2019-09-29 (×5): 1 [drp] via OPHTHALMIC
  Filled 2019-09-25 (×2): qty 2.5

## 2019-09-25 MED ORDER — AMLODIPINE BESYLATE 5 MG PO TABS
2.5000 mg | ORAL_TABLET | Freq: Every day | ORAL | Status: DC
  Administered 2019-09-25 – 2019-09-26 (×2): 2.5 mg via ORAL
  Filled 2019-09-25 (×2): qty 1

## 2019-09-25 MED ORDER — ALPRAZOLAM 1 MG PO TABS
1.0000 mg | ORAL_TABLET | Freq: Two times a day (BID) | ORAL | Status: DC | PRN
  Administered 2019-09-25 – 2019-09-30 (×8): 1 mg via ORAL
  Filled 2019-09-25 (×8): qty 1

## 2019-09-25 MED ORDER — FINASTERIDE 5 MG PO TABS
5.0000 mg | ORAL_TABLET | ORAL | Status: DC
  Administered 2019-09-25 – 2019-09-30 (×6): 5 mg via ORAL
  Filled 2019-09-25 (×7): qty 1

## 2019-09-25 MED ORDER — TAMSULOSIN HCL 0.4 MG PO CAPS
0.4000 mg | ORAL_CAPSULE | Freq: Every day | ORAL | Status: DC
  Administered 2019-09-25 – 2019-09-29 (×5): 0.4 mg via ORAL
  Filled 2019-09-25 (×5): qty 1

## 2019-09-25 MED ORDER — ACETAMINOPHEN 650 MG RE SUPP: 650.0000 mg | Freq: Four times a day (QID) | RECTAL | Status: DC | PRN

## 2019-09-25 NOTE — ED Provider Notes (Signed)
   Patient signed out to me by Emeterio Reeve, PA-C pending completion of work-up and reassessment.  Patient here after a mechanical fall this afternoon with complaints of pain to his mid and lower back.  He states he fell backwards onto the floor.  Plain film imaging of the thoracic and lumbar spine shows an age-indeterminate compression fractures of T11 and 12, these are new from 2020.  On my assessment, patient is only able to stand for brief period of time and is unable to make any steps.  This is new finding per patient and his son.  Patient is normally able to ambulate with his walker.  I feel that these compression fractures are likely new, a TLSO brace was ordered.  Patient does live at home alone and I feel that he would benefit from admission given the that he is unable to ambulate at this time.  I have consulted hospitalist, Dr. Humphrey Rolls and discussed findings,  He agrees to admit.     Kem Parkinson, PA-C 09/25/19 0126    Milton Ferguson, MD 09/25/19 [phone]

## 2019-09-25 NOTE — Progress Notes (Signed)
Orthopedic Tech Progress Note Patient Details:  Curtis [name] 08-Mar-1946 [id number]  Patient ID: [name], male   DOB: [birth year], 74 y.o.   MRN: [id number]  Called order into hanger Karolee Stamps 09/25/2019, 12:15 AM

## 2019-09-25 NOTE — H&P (Signed)
History and Physical    Curtis [name] [id number] DOB: 05-31-1945 DOA: 09/24/2019  PCP: [name], MD (Confirm with patient/family/NH records and if not entered, this has to be entered at [facility] point of entry) Patient coming from: Home  I have personally briefly reviewed patient's old medical records in [place]  Chief Complaint: Back pain secondary to mechanical fall  HPI: Curtis [name] is a 74 y.o. 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  Patient states that he was walking with his walker and suddenly fell backward on the floor after his walker stopped on the bathroom rug.  Patient denies losing consciousness and reports pain in his head, neck, back and hips patient states that he lives alone by himself but his son is next-door and check on him on regular basis.  Patient is complaining of lower back pain but denies numbness, weakness, urinary incontinence and stool incontinence.  Patient is not on any anticoagulation.  Patient also denies fever, chills, chest pain, shortness of breath, nausea, vomiting, abdominal pain urinary symptoms.  ED Course: On arrival to the ED patient had temperature of 98.8, blood pressure 117/61, heart rate 94, respiratory rate 20 and oxygen saturation 94% on room air.  Blood work showed WBC 12.1, hemoglobin 10.7, sodium 133, potassium 4.6, BUN 31, creatinine 0.6, blood glucose 150 and albumin 2.5.  CT head was negative for acute intracranial bleed or pathology.  CT scan of cervical spine was also negative.  Chest x-ray negative for acute pulmonary problem x-ray lumbar region was positive for compression fracture of T11 and T12 vertebral bodies but age not determined.  Albuterol inhalation and me Thiel prednisone in the ED.   Patient was able to stand for a brief period of time but was unable to make any steps.  TLSO brace was also ordered in the ED and patient admitted for observation.  CODE STATUS was discussed with patient and his son and patient does not want intubation and mechanical ventilation but wants to have chest compressions and all the life-saving medications if patient's heart or lungs stop working.  Review of Systems: As per HPI otherwise 10 point review of systems negative.  Unacceptable ROS statements: "10 systems reviewed," "Extensive" (without elaboration).  Acceptable ROS statements: "All others negative," "All others reviewed and are negative," and "All others unremarkable," with at [place] documented Can't double dip - if using for HPI can't use for ROS  Past Medical History:  Diagnosis Date  . AAA (abdominal aortic aneurysm), stable 04/20/2014  . Arthritis   . 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 CAD (coronary artery disease)    a. s/p cath in 2015 showing occluded RCA and high-grade LCx stenosis treated with DESx2 to LCx  . Cancer ([place])   . Depression   . Diverticulitis    hospital 2011 [facility]  . Diverticulosis   . GERD (gastroesophageal reflux disease)   . 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 Hernia of unspecified site of abdominal cavity without mention of obstruction or gangrene    hiatal  . Hyperlipidemia LDL goal <70 04/20/2014  . Hypertension   . IBS (irritable bowel syndrome)   . Myocardial infarction ([place]) 01/2014   NSTEMI  . Peripheral vascular disease (HCC)    ankle brachial index of 0.79 on the right and  0.61 on the left.   . Pneumonia   . 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[place])   . Reflex sympathetic dystrophy   . 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 SOB (shortness of breath)     Past Surgical History:  Procedure Laterality Date  . CARDIAC CATHETERIZATION    . CATARACT EXTRACTION    . CORONARY ANGIOPLASTY    . HERNIA REPAIR    . INTRAMEDULLARY (IM) NAIL INTERTROCHANTERIC  Left 04/14/2019   Procedure: INTRAMEDULLARY (IM) NAIL INTERTROCHANTRIC;  Surgeon: Rod Can, MD;  Location: [place];  Service: Orthopedics;  Laterality: Left;  . LEFT HEART CATHETERIZATION WITH CORONARY ANGIOGRAM N/A 01/17/2014   Procedure: LEFT HEART CATHETERIZATION WITH CORONARY ANGIOGRAM;  Surgeon: Leonie Man, MD;  Location: [facility] C[facility] LAB;  Service: Cardiovascular;  Laterality: N/A;  . left shoulder      x 3  . NISSEN FUNDOPLICATION    . right elbow surgery     x 2  . right knee arthroscopy    . squamous cell skin cancer     Left Hand  . SUPRAVENTRICULAR TACHYCARDIA ABLATION N/A 01/16/2014   Procedure: SUPRAVENTRICULAR TACHYCARDIA ABLATION;  Surgeon: Evans Lance, MD;  Location: [facility] CATH LAB;  Service: Cardiovascular;  Laterality: N/A;     reports that he quit smoking about 14 years ago. His smoking use included cigarettes. He has a 125.00 pack-year smoking history. He quit smokeless tobacco use about 7 years ago.  His smokeless tobacco use included chew. He reports that he does not drink alcohol or use drugs.  Allergies  Allergen Reactions  . Codeine Itching  . Trileptal [Oxcarbazepine] Other (See Comments)    Abnormal sodium levels Hyponatremia    Family History  Problem Relation Age of Onset  . Colon cancer Mother   . Breast cancer Sister 23  . Colon cancer Maternal Aunt   . Colon cancer Maternal Uncle   . Heart attack Neg Hx   . Stroke Neg Hx     Unacceptable: Noncontributory, unremarkable, or negative. Acceptable: (example)Family history negative for heart disease  Prior to Admission medications   Medication Sig Start Date End Date Taking? Authorizing Provider  acetaminophen (TYLENOL) 325 MG tablet Take 650 mg by mouth 3 (three) times daily.    [provider]  albuterol (VENTOLIN HFA) 108 (90 Base) MCG/ACT inhaler 2 puffs every 4 - 6 hours as needed. This is your rescue inhaler Patient taking differently: Inhale 1 puff into the lungs every 4 (four)  hours as needed for wheezing or shortness of breath.  05/05/19   Margaretha Seeds, MD  ALPRAZolam [name]) 1 MG tablet Take 1 tablet (1 mg total) by mouth 2 (two) times daily as needed for anxiety or sleep. 04/19/19   Mikhail, Velta Addison, DO  Amino Acids-Protein Hydrolys (FEEDING SUPPLEMENT, PRO-STAT SUGAR FREE 64,) LIQD Take 30 mLs by mouth 2 (two) times daily between meals.     [provider]  amitriptyline (ELAVIL) 100 MG tablet Take 1 tablet (100 mg total) by mouth at bedtime. 03/29/19   Gerlene Fee, NP  amLODipine (NORVASC) 2.5 MG tablet Take 2.5 mg by mouth daily.    [provider]  Ascorbic Acid (VITAMIN C) 1000 MG tablet Take 1,000 mg by mouth daily.    [provider]  aspirin EC 81 MG tablet Take 81 mg by mouth daily.    [provider]  atorvastatin (LIPITOR) 20 MG tablet Take 20 mg by mouth at bedtime.  [provider]  b complex vitamins tablet Take 1 tablet by mouth daily.    [provider]  Janne Lab Oil [facility]) OINT Apply 1 application topically See admin instructions. Apply topically to coccyx, sacrum, bilateral buttocks each shift as needed for itching/rash    [provider]  bisacodyl (DULCOLAX) 5 MG EC tablet Take 5 mg by mouth daily as needed for mild constipation or moderate constipation.    [provider]  budesonide-formoterol (SYMBICORT) 160-4.5 MCG/ACT inhaler Inhale 2 puffs into the lungs 2 (two) times daily. Patient not taking: Reported on 05/14/2019 03/29/19   Gerlene Fee, NP  camphor-menthol [facility]) lotion Apply 1 application topically 2 (two) times daily as needed for itching.    [provider]  Cholecalciferol (VITAMIN D) 50 MCG (2000 UT) tablet Take 2,000 Units by mouth daily.    [provider]  collagenase (SANTYL) ointment Apply 1 application topically daily. Apply to left buttock wound and bilateral heel ulcerations. Patient taking differently: Apply 1  application topically See admin instructions. Apply thin layer topically to right and left heel once daily; cover with non-adhesive pad, gauze wrap 03/29/19   Gerlene Fee, NP  docusate sodium (COLACE) 100 MG capsule Take 100 mg by mouth daily.     [provider]  enoxaparin (LOVENOX) 40 MG/0.4ML injection Inject 0.4 mLs (40 mg total) into the skin daily. Patient not taking: Reported on 05/14/2019 04/16/19 05/16/19  Rod Can, MD  finasteride (PROSCAR) 5 MG tablet Take 1 tablet (5 mg total) by mouth every morning. Patient taking differently: Take 5 mg by mouth daily.  03/29/19   Gerlene Fee, NP  fluticasone (FLONASE) 50 MCG/ACT nasal spray Place 1 spray into both nostrils daily.    [provider]  Fluticasone-Umeclidin-Vilant (TRELEGY ELLIPTA) 200-62.5-25 MCG/INH AEPB Inhale 1 puff into the lungs daily. 05/05/19   Margaretha Seeds, MD  gabapentin (NEURONTIN) 300 MG capsule Take 300 mg by mouth 3 (three) times daily. Take with a 400 mg capsule for a total dose of 700 mg three times daily    [provider]  gabapentin (NEURONTIN) 400 MG capsule Take 400 mg by mouth 3 (three) times daily. Take with a 300 mg capsule for a total dose of 700 mg three times daily    [provider]  guaiFENesin (MUCINEX) 600 MG 12 hr tablet Take 600 mg by mouth 2 (two) times daily.     [provider]  latanoprost (XALATAN) 0.005 % ophthalmic solution Place 1 drop into both eyes at bedtime. 03/29/19   Gerlene Fee, NP  lidocaine (LIDODERM) 5 % Place 1 patch onto the skin daily. Remove & Discard patch within 12 hours or as directed by MD 09/24/19   Kem Parkinson, PA-C  Melatonin 3 MG TABS Take 6 mg by mouth at bedtime.    [provider]  metoprolol tartrate (LOPRESSOR) 25 MG tablet Take 0.5 tablets (12.5 mg total) by mouth 2 (two) times daily. 05/20/19 06/19/19  Elodia Florence., MD  Multiple Vitamins-Minerals (MULTIVITAMIN WITH MINERALS) tablet  Take 1 tablet by mouth daily. Patient not taking: Reported on 05/14/2019 04/19/19   Cristal Ford, DO  nitroGLYCERIN (NITROSTAT) 0.4 MG SL tablet Place 1 tablet (0.4 mg total) under the tongue every 5 (five) minutes x 3 doses as needed for chest pain. 03/29/19   Gerlene Fee, NP  omeprazole (PRILOSEC) 40 MG capsule Take 40 mg by mouth daily before breakfast.    [provider]  ondansetron (ZOFRAN) 4 MG tablet Take 4 mg by mouth every 6 (six) hours as needed for nausea or vomiting.    [provider]  OVER THE COUNTER MEDICATION Take 1 tablet by mouth daily. Therems Multivitamin with folic acid    [provider]  OXYGEN Place 4 L/min into the nose continuous.     [provider]  saccharomyces boulardii (FLORASTOR) 250 MG capsule Take 250 mg by mouth daily.    [provider]  tamsulosin (FLOMAX) 0.4 MG CAPS capsule Take 1 capsule (0.4 mg total) by mouth at bedtime. 03/29/19   Gerlene Fee, NP  Zinc 50 MG TABS Take 50 mg by mouth daily.    [provider]  lamoTRIgine (LAMICTAL) 100 MG tablet Take 1 tablet (100 mg total) by mouth 2 (two) times daily. 01/23/16 01/29/16  Kathrynn Ducking, MD    Physical Exam: Vitals:   09/25/19 0330 09/25/19 0345 09/25/19 0400 09/25/19 0500  BP:   132/72 133/76  Pulse: 91 89 94 89  Resp:   18 18  Temp:      TempSrc:      SpO2: 93% 93% 91% 94%  Weight:      Height:        Constitutional: NAD, calm, comfortable Vitals:   09/25/19 0330 09/25/19 0345 09/25/19 0400 09/25/19 0500  BP:   132/72 133/76  Pulse: 91 89 94 89  Resp:   18 18  Temp:      TempSrc:      SpO2: 93% 93% 91% 94%  Weight:      Height:        General : 74 year old Caucasian male in no acute distress. Eyes: PERRL, lids and conjunctivae normal ENMT: Mucous membranes are moist. Posterior pharynx clear of any exudate or lesions.Normal dentition.  Neck: normal, supple, no masses, no thyromegaly Respiratory: Patient on 4 L  of oxygen with nasal cannula.  Mild bilateral wheezing on expiration in both lungs.  No Rales or rhonchi appreciated on chest auscultation. Normal respiratory effort. No accessory muscle use.  Cardiovascular: Regular rate and rhythm, no murmurs / rubs / gallops. No extremity edema. 2+ pedal pulses. No carotid bruits.  Abdomen: no tenderness, no masses palpated. No hepatosplenomegaly. Bowel sounds positive.  Musculoskeletal: no clubbing / cyanosis. No joint deformity upper and lower extremities. Good ROM, no contractures. Normal muscle tone.  Skin: no rashes, lesions, ulcers. No induration Neurologic: CN 2-12 grossly intact. Sensation intact, DTR normal. Strength 5/5 in all 4.  Psychiatric: Normal judgment and insight. Alert and oriented x 3. Normal mood.   (Anything < 9 systems with 2 bullets each down codes to level 1) (If patient refuses exam can't bill higher level) (Make sure to document decubitus ulcers present on admission -- if possible -- and whether patient has chronic indwelling catheter at time of admission)  Labs on Admission: I have personally reviewed following labs and imaging studies  CBC: Recent Labs  Lab 09/24/19 2215  WBC 12.1*  NEUTROABS 10.6*  HGB 10.7*  HCT 36.3*  MCV 88.1  PLT 0000000   Basic Metabolic Panel: Recent Labs  Lab 09/24/19 2215  NA 140  K 5.1  CL 96*  CO2 32  GLUCOSE 122*  BUN 22  CREATININE 1.59*  CALCIUM 10.4*   GFR: Estimated Creatinine Clearance: 44.1 mL/min (A) (by C-G formula based on SCr of 1.59 mg/dL (H)). Liver Function Tests: Recent Labs  Lab 09/24/19 2215  AST 19  ALT  12  ALKPHOS 101  BILITOT 0.4  PROT 8.5*  ALBUMIN 3.2*   No results for input(s): LIPASE, AMYLASE in the last 168 hours. No results for input(s): AMMONIA in the last 168 hours. Coagulation Profile: No results for input(s): INR, PROTIME in the last 168 hours. Cardiac Enzymes: No results for input(s): CKTOTAL, CKMB, CKMBINDEX, TROPONINI in the last 168  hours. BNP (last 3 results) No results for input(s): PROBNP in the last 8760 hours. HbA1C: No results for input(s): HGBA1C in the last 72 hours. CBG: No results for input(s): GLUCAP in the last 168 hours. Lipid Profile: No results for input(s): CHOL, HDL, LDLCALC, TRIG, CHOLHDL, LDLDIRECT in the last 72 hours. Thyroid Function Tests: No results for input(s): TSH, T4TOTAL, FREET4, T3FREE, THYROIDAB in the last 72 hours. Anemia Panel: No results for input(s): VITAMINB12, FOLATE, FERRITIN, TIBC, IRON, RETICCTPCT in the last 72 hours. Urine analysis:    Component Value Date/Time   COLORURINE YELLOW 04/16/2019 2049   APPEARANCEUR CLEAR 04/16/2019 2049   LABSPEC 1.011 04/16/2019 2049   PHURINE 5.0 04/16/2019 2049   GLUCOSEU NEGATIVE 04/16/2019 2049   HGBUR NEGATIVE 04/16/2019 2049   [place] NEGATIVE 04/16/2019 2049   KETONESUR 5 (A) 04/16/2019 2049   PROTEINUR NEGATIVE 04/16/2019 2049   UROBILINOGEN 0.2 02/12/2015 1859   NITRITE NEGATIVE 04/16/2019 2049   LEUKOCYTESUR NEGATIVE 04/16/2019 2049    Radiological Exams on Admission: DG Chest 2 View  Result Date: 09/24/2019 CLINICAL DATA:  Pain status post fall EXAM: CHEST - 2 VIEW COMPARISON:  May 14, 2019 FINDINGS: Again noted are persistent diffuse patchy bilateral airspace opacification. December prior study and is favored be secondary to a combination atelectasis, scarring, and pleural based plaques. There is cardiomegaly. There are atherosclerotic changes of the thoracic aorta. There is no pneumothorax. IMPRESSION: No acute cardiopulmonary process. Stable cardiomegaly and bilateral calcified pleural based plaques as seen on the patient's prior CTs. Electronically Signed   By: Constance Holster M.D.   On: 09/24/2019 21:16   DG Thoracic Spine 2 View  Result Date: 09/24/2019 CLINICAL DATA:  Pain status post fall EXAM: LUMBAR SPINE - COMPLETE 4+ VIEW; THORACIC SPINE 2 VIEWS COMPARISON:  None. FINDINGS: For the purpose of this  report, there are rudimentary ribs at the T12 level. There is probable there is an age-indeterminate compression fracture of the T12 vertebral body resulting in approximately 25-30% height loss. This fracture is new since 02/11/2019. There appears to be some mild height loss of the T11 vertebral body which is new since CT dated 02/11/2019. Evaluation of the mid upper thoracic spine is limited by osteopenia and overlapping osseous structures. Advanced vascular calcifications are noted throughout the abdominal aorta with an infrarenal abdominal aortic aneurysm measuring up to approximately 3.4 cm. There is diffuse osteopenia throughout the visualized portions of the thoracolumbar spine with multilevel degenerative changes and facet arthrosis, especially at the lower lumbar segments. IMPRESSION: 1. Age-indeterminate compression fractures of the T11 and T12 vertebral bodies, new since 2020. 2. The midthoracic spine is suboptimally evaluated secondary to osteopenia and overlapping structures. 3. Infrarenal abdominal aortic aneurysm measuring up to approximately 3.4 cm. Recommend followup by ultrasound in 3 years. This recommendation follows ACR consensus guidelines: White Paper of the ACR Incidental Findings Committee II on Vascular Findings. J Am Coll Radiol 2013; 10:789-794 Aortic Atherosclerosis (ICD10-I70.0). Electronically Signed   By: Constance Holster M.D.   On: 09/24/2019 21:15   DG Lumbar Spine Complete  Result Date: 09/24/2019 CLINICAL DATA:  Pain status post fall  EXAM: LUMBAR SPINE - COMPLETE 4+ VIEW; THORACIC SPINE 2 VIEWS COMPARISON:  None. FINDINGS: For the purpose of this report, there are rudimentary ribs at the T12 level. There is probable there is an age-indeterminate compression fracture of the T12 vertebral body resulting in approximately 25-30% height loss. This fracture is new since 02/11/2019. There appears to be some mild height loss of the T11 vertebral body which is new since CT dated  02/11/2019. Evaluation of the mid upper thoracic spine is limited by osteopenia and overlapping osseous structures. Advanced vascular calcifications are noted throughout the abdominal aorta with an infrarenal abdominal aortic aneurysm measuring up to approximately 3.4 cm. There is diffuse osteopenia throughout the visualized portions of the thoracolumbar spine with multilevel degenerative changes and facet arthrosis, especially at the lower lumbar segments. IMPRESSION: 1. Age-indeterminate compression fractures of the T11 and T12 vertebral bodies, new since 2020. 2. The midthoracic spine is suboptimally evaluated secondary to osteopenia and overlapping structures. 3. Infrarenal abdominal aortic aneurysm measuring up to approximately 3.4 cm. Recommend followup by ultrasound in 3 years. This recommendation follows ACR consensus guidelines: White Paper of the ACR Incidental Findings Committee II on Vascular Findings. J Am Coll Radiol 2013; 10:789-794 Aortic Atherosclerosis (ICD10-I70.0). Electronically Signed   By: Constance Holster M.D.   On: 09/24/2019 21:15   CT Head Wo Contrast  Result Date: 09/24/2019 CLINICAL DATA:  Status post fall with trauma to the head. EXAM: CT HEAD WITHOUT CONTRAST CT CERVICAL SPINE WITHOUT CONTRAST TECHNIQUE: Multidetector CT imaging of the head and cervical spine was performed following the standard protocol without intravenous contrast. Multiplanar CT image reconstructions of the cervical spine were also generated. COMPARISON:  August 20, 2017 FINDINGS: CT HEAD FINDINGS Brain: No evidence of acute infarction, hemorrhage, hydrocephalus, extra-axial collection or mass lesion/mass effect. Advanced brain parenchymal volume loss and deep white matter microangiopathy Vascular: Intracranial calcific atherosclerotic disease. Skull: Normal. Negative for fracture or focal lesion. Sinuses/Orbits: No acute finding. Other: None. CT CERVICAL SPINE FINDINGS Alignment: Normal. Skull base and  vertebrae: No acute fracture. No primary bone lesion or focal pathologic process. Soft tissues and spinal canal: No prevertebral fluid or swelling. No visible canal hematoma. Disc levels: Multilevel osteoarthritic changes of the cervical spine. Upper chest: Right supraclavicular and superior mediastinal lymphadenopathy. Other: Linear and nodular pleural thickening and calcifications in the bilateral lung apices. Interstitial thickening. IMPRESSION: 1. No acute intracranial abnormality. 2. Advanced brain parenchymal atrophy and chronic microvascular disease. 3. No evidence of acute traumatic injury to cervical spine. 4. Multilevel osteoarthritic changes of the cervical spine. 5. Bulky right supraclavicular and superior mediastinal lymphadenopathy. Please correlate clinically. 6. Linear and nodular pleural thickening and calcifications in the bilateral lung apices. Interstitial thickening. Electronically Signed   By: Fidela Salisbury M.D.   On: 09/24/2019 20:50   CT Cervical Spine Wo Contrast  Result Date: 09/24/2019 CLINICAL DATA:  Status post fall with trauma to the head. EXAM: CT HEAD WITHOUT CONTRAST CT CERVICAL SPINE WITHOUT CONTRAST TECHNIQUE: Multidetector CT imaging of the head and cervical spine was performed following the standard protocol without intravenous contrast. Multiplanar CT image reconstructions of the cervical spine were also generated. COMPARISON:  August 20, 2017 FINDINGS: CT HEAD FINDINGS Brain: No evidence of acute infarction, hemorrhage, hydrocephalus, extra-axial collection or mass lesion/mass effect. Advanced brain parenchymal volume loss and deep white matter microangiopathy Vascular: Intracranial calcific atherosclerotic disease. Skull: Normal. Negative for fracture or focal lesion. Sinuses/Orbits: No acute finding. Other: None. CT CERVICAL SPINE FINDINGS Alignment: Normal. Skull base  and vertebrae: No acute fracture. No primary bone lesion or focal pathologic process. Soft tissues  and spinal canal: No prevertebral fluid or swelling. No visible canal hematoma. Disc levels: Multilevel osteoarthritic changes of the cervical spine. Upper chest: Right supraclavicular and superior mediastinal lymphadenopathy. Other: Linear and nodular pleural thickening and calcifications in the bilateral lung apices. Interstitial thickening. IMPRESSION: 1. No acute intracranial abnormality. 2. Advanced brain parenchymal atrophy and chronic microvascular disease. 3. No evidence of acute traumatic injury to cervical spine. 4. Multilevel osteoarthritic changes of the cervical spine. 5. Bulky right supraclavicular and superior mediastinal lymphadenopathy. Please correlate clinically. 6. Linear and nodular pleural thickening and calcifications in the bilateral lung apices. Interstitial thickening. Electronically Signed   By: Fidela Salisbury M.D.   On: 09/24/2019 20:50   DG Hips Bilat W or Wo Pelvis 3-4 Views  Result Date: 09/24/2019 CLINICAL DATA:  Pain status post fall EXAM: DG HIP (WITH OR WITHOUT PELVIS) 3-4V BILAT COMPARISON:  December 9th 2020. FINDINGS: The patient has undergone prior intramedullary nail placement through the left femur. The hardware is intact. There is no evidence for an acute displaced fracture or dislocation. Atherosclerotic changes are noted. IMPRESSION: No acute displaced fracture or dislocation. Electronically Signed   By: Constance Holster M.D.   On: 09/24/2019 21:17      Assessment/Plan Principal Problem:   Fall at home Patient had a mechanical fall at home and was complaining of lower back pain and headache. CT head and CT cervical spine was negative but stated lumbar region showed compression fracture of T11 and T12 vertebral bodies. TLSO brace ordered in the ED Tylenol every 6 hours as needed for mild pain and fever, Norco every 4 hour as needed for moderate pain while fentanyl injection 50 MCG every 4 hours as needed for severe pain ordered. PT/OT consult ordered  for evaluation and recommendations  Active Problems:   COPD with chronic bronchitis ([place]) Continue home oxygen Continue home medications after med history is completed by pharmacy    Hyperlipidemia  Start home medication after  medication history confirmed by pharmacy    Anemia, iron deficiency Stable at baseline    Anxiety disorder Patient not in acute anxiety. Continue home medications after confirmation by pharmacy.    DVT prophylaxis: Heparin Code Status: Partial code (no intubation) Family Communication: Patient's condition is discussed with patient's son in detail. Disposition Plan:  Consults called: None Admission status: Observation/MedSurg   Edmonia Lynch MD [facility] Pager [phone]   If 7PM-7AM, please contact night-coverage [email or web address] Password [facility]  09/25/2019, 6:07 AM

## 2019-09-25 NOTE — Progress Notes (Signed)
Per HPI: Curtis [name] is a 74 y.o. 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  Patient states that he was walking with his walker and suddenly fell backward on the floor after his walker stopped on the bathroom rug.  Patient denies losing consciousness and reports pain in his head, neck, back and hips patient states that he lives alone by himself but his son is next-door and check on him on regular basis.  Patient is complaining of lower back pain but denies numbness, weakness, urinary incontinence and stool incontinence.  Patient is not on any anticoagulation.  Patient also denies fever, chills, chest pain, shortness of breath, nausea, vomiting, abdominal pain urinary symptoms.  -Patient admitted with fall at home and is complaining of low back pain with noted age-indeterminate T11 and T12 vertebral body compression fractures.  He is also noted to have some AKI for which have started IV fluid with work-up pending.  Plan to repeat labs in a.m.  Discussed case with Dr. Anselm Pancoast with IR regarding potential vertebroplasty.  He has recommended lumbar MRI in a.m. and if there are any acute changes present, he may be a candidate for intervention.  Otherwise, plan for PT/OT evaluation with brace in the meantime.  Discussed with son on phone as well.  Total care time: 40 minutes.

## 2019-09-26 LAB — BASIC METABOLIC PANEL
Anion gap: 9 (ref 5–15)
BUN: 32 mg/dL — ABNORMAL HIGH (ref 8–23)
CO2: 34 mmol/L — ABNORMAL HIGH (ref 22–32)
Calcium: 9.7 mg/dL (ref 8.9–10.3)
Chloride: 95 mmol/L — ABNORMAL LOW (ref 98–111)
Creatinine, Ser: 1.46 mg/dL — ABNORMAL HIGH (ref 0.61–1.24)
GFR calc Af Amer: 54 mL/min — ABNORMAL LOW (ref >60)
GFR calc non Af Amer: 46 mL/min — ABNORMAL LOW (ref >60)
Glucose, Bld: 89 mg/dL (ref 70–99)
Potassium: 4 mmol/L (ref 3.5–5.1)
Sodium: 138 mmol/L (ref 135–145)

## 2019-09-26 LAB — CBC
HCT: 32.3 % — ABNORMAL LOW (ref 39.0–52.0)
Hemoglobin: 9.7 g/dL — ABNORMAL LOW (ref 13.0–17.0)
MCH: 25.9 pg — ABNORMAL LOW (ref 26.0–34.0)
MCHC: 30 g/dL (ref 30.0–36.0)
MCV: 86.4 fL (ref 80.0–100.0)
Platelets: 328 10*3/uL (ref 150–400)
RBC: 3.74 MIL/uL — ABNORMAL LOW (ref 4.22–5.81)
RDW: 15 % (ref 11.5–15.5)
WBC: 10 10*3/uL (ref 4.0–10.5)
nRBC: 0 % (ref 0.0–0.2)

## 2019-09-26 MED ORDER — AMLODIPINE BESYLATE 5 MG PO TABS
5.0000 mg | ORAL_TABLET | Freq: Every day | ORAL | Status: DC
  Administered 2019-09-27 – 2019-09-30 (×4): 5 mg via ORAL
  Filled 2019-09-26 (×4): qty 1

## 2019-09-26 MED ORDER — GABAPENTIN 300 MG PO CAPS
600.0000 mg | ORAL_CAPSULE | Freq: Three times a day (TID) | ORAL | Status: DC
  Administered 2019-09-26 – 2019-09-30 (×13): 600 mg via ORAL
  Filled 2019-09-26 (×13): qty 2

## 2019-09-26 MED ORDER — LABETALOL HCL 5 MG/ML IV SOLN: 10.0000 mg | INTRAVENOUS | Status: DC | PRN

## 2019-09-26 NOTE — Progress Notes (Signed)
PROGRESS NOTE    Curtis [name]  [id number] DOB: Nov 25, 1945 DOA: 09/24/2019 PCP: [name], MD   Brief Narrative: Per HPI: Curtis [name] a 74 y.o.malewith medical history significant ofhypertension, COPD on 4 L of oxygen for chronic respiratory failure, peripheral vascular disease, pulmonary asbestosis and coronary artery disease presented to ED for evaluation of lower back pain secondary to mechanical fall. Patient states that he was walking with his walker and suddenly fell backward on the floor after his walker stopped on the bathroom rug. Patient denies losing consciousness and reports pain in his head, neck, back and hips patient states that he lives alone by himself but his son is next-door and check on him on regular basis. Patient is complaining of lower back pain but denies numbness, weakness, urinary incontinence and stool incontinence. Patient is not on any anticoagulation. Patient also denies fever, chills, chest pain, shortness of breath, nausea, vomiting, abdominal pain urinary symptoms.  -Patient admitted with fall at home and is complaining of low back pain with noted age-indeterminate T11 and T12 vertebral body compression fractures.  He is also noted to have some AKI for which have started IV fluid with work-up pending.  He is nonoliguric and appears to be improving some.  PT/OT evaluation pending.  Plan for MRI by 5/25.  Assessment & Plan:   Principal Problem:   Fall at home Active Problems:   COPD with chronic bronchitis ([place])   Hyperlipidemia LDL goal <70   Anemia, iron deficiency   Anxiety disorder   Ambulatory dysfunction   Fall  Recurrent falls with associated T11/T12 compression fractures -TLSO brace ordered with PT/OT evaluation pending -Plan for MRI in a.m. to assess for any acute changes for which vertebral or kyphoplasty may be indicated -Continue current pain management  AKI -Continue IV fluid with improvement  noted -Nonoliguric -Avoid nephrotoxic agents -Follow I's and O's -Follow a.m. labs  COPD with chronic bronchitis -Continue home medications -No acute bronchospasms noted  Iron deficiency anemia -Currently stable  Anxiety disorder -Continue home medications as tolerated  DVT prophylaxis: Heparin Code Status: Partial code (no intubation) Family Communication: Discussed with son over phone Disposition Plan: Continue close monitoring with PT/OT evaluation pending an MRI in a.m. Status is: Inpatient  Remains inpatient appropriate because:Ongoing diagnostic testing needed not appropriate for outpatient work up and Unsafe d/c plan   Dispo: The patient is from: Home              Anticipated d/c is to: SNF              Anticipated d/c date is: 2 days              Patient currently is not medically stable to d/c.   Consultants:   None  Procedures:   None  Antimicrobials:   None   Subjective: Patient seen and evaluated today with no new acute complaints or concerns. No acute concerns or events noted overnight.  Objective: Vitals:   09/25/19 1533 09/25/19 1933 09/25/19 2330 09/26/19 0626  BP: (!) 151/98 (!) 157/83 (!) 151/86 (!) 178/85  Pulse: 90 90 74 85  Resp: 16 16 16 16   Temp: 98.3 F (36.8 C) 97.8 F (36.6 C) 97.6 F (36.4 C) (!) 97.5 F (36.4 C)  TempSrc: Oral Oral Oral Oral  SpO2: 94% 96% 99% 95%  Weight:      Height:        Intake/Output Summary (Last 24 hours) at 09/26/2019 1226 Last data filed  at 09/26/2019 0605 Gross per 24 hour  Intake 220.6 ml  Output 800 ml  Net -579.4 ml   Filed Weights   09/24/19 1923  Weight: 81.6 kg    Examination:  General exam: Appears calm and comfortable  Respiratory system: Clear to auscultation. Respiratory effort normal. Cardiovascular system: S1 & S2 heard, RRR. No JVD, murmurs, rubs, gallops or clicks. No pedal edema. Gastrointestinal system: Abdomen is nondistended, soft and nontender. No organomegaly or  masses felt. Normal bowel sounds heard. Central nervous system: Alert and oriented. No focal neurological deficits. Extremities: Symmetric 5 x 5 power. Skin: No rashes, lesions or ulcers Psychiatry: Judgement and insight appear normal. Mood & affect appropriate.     Data Reviewed: I have personally reviewed following labs and imaging studies  CBC: Recent Labs  Lab 09/24/19 2215 09/26/19 0546  WBC 12.1* 10.0  NEUTROABS 10.6*  --   HGB 10.7* 9.7*  HCT 36.3* 32.3*  MCV 88.1 86.4  PLT 349 [id number]   Basic Metabolic Panel: Recent Labs  Lab 09/24/19 2215 09/26/19 0546  NA 140 138  K 5.1 4.0  CL 96* 95*  CO2 32 34*  GLUCOSE 122* 89  BUN 22 32*  CREATININE 1.59* 1.46*  CALCIUM 10.4* 9.7   GFR: Estimated Creatinine Clearance: 48 mL/min (A) (by C-G formula based on SCr of 1.46 mg/dL (H)). Liver Function Tests: Recent Labs  Lab 09/24/19 2215  AST 19  ALT 12  ALKPHOS 101  BILITOT 0.4  PROT 8.5*  ALBUMIN 3.2*   No results for input(s): LIPASE, AMYLASE in the last 168 hours. No results for input(s): AMMONIA in the last 168 hours. Coagulation Profile: No results for input(s): INR, PROTIME in the last 168 hours. Cardiac Enzymes: No results for input(s): CKTOTAL, CKMB, CKMBINDEX, TROPONINI in the last 168 hours. BNP (last 3 results) No results for input(s): PROBNP in the last 8760 hours. HbA1C: No results for input(s): HGBA1C in the last 72 hours. CBG: Recent Labs  Lab 09/25/19 0859  GLUCAP 157*   Lipid Profile: No results for input(s): CHOL, HDL, LDLCALC, TRIG, CHOLHDL, LDLDIRECT in the last 72 hours. Thyroid Function Tests: No results for input(s): TSH, T4TOTAL, FREET4, T3FREE, THYROIDAB in the last 72 hours. Anemia Panel: No results for input(s): VITAMINB12, FOLATE, FERRITIN, TIBC, IRON, RETICCTPCT in the last 72 hours. Sepsis Labs: No results for input(s): PROCALCITON, LATICACIDVEN in the last 168 hours.  Recent Results (from the past 240 hour(s))  SARS  Coronavirus 2 by RT PCR (hospital order, performed in [facility] hospital lab) Nasopharyngeal Nasopharyngeal Swab     Status: None   Collection Time: 09/25/19  1:28 AM   Specimen: Nasopharyngeal Swab  Result Value Ref Range Status   SARS Coronavirus 2 NEGATIVE NEGATIVE Final    Comment: (NOTE) SARS-CoV-2 target nucleic acids are NOT DETECTED. The SARS-CoV-2 RNA is generally detectable in upper and lower respiratory specimens during the acute phase of infection. The lowest concentration of SARS-CoV-2 viral copies this assay can detect is 250 copies / mL. A negative result does not preclude SARS-CoV-2 infection and should not be used as the sole basis for treatment or other patient management decisions.  A negative result may occur with improper specimen collection / handling, submission of specimen other than nasopharyngeal swab, presence of viral mutation(s) within the areas targeted by this assay, and inadequate number of viral copies (<250 copies / mL). A negative result must be combined with clinical observations, patient history, and epidemiological information. Fact Sheet for Patients:  StrictlyIdeas.no Fact Sheet for Healthcare Providers: BankingDealers.co.za This test is not yet approved or cleared  by the [place] FDA and has been authorized for detection and/or diagnosis of SARS-CoV-2 by FDA under an Emergency Use Authorization (EUA).  This EUA will remain in effect (meaning this test can be used) for the duration of the COVID-19 declaration under Section 564(b)(1) of the Act, 21 U.S.C. section 360bbb-3(b)(1), unless the authorization is terminated or revoked sooner. Performed at [facility], [address], [place], [place] [address]          Radiology Studies: DG Chest 2 View  Result Date: 09/24/2019 CLINICAL DATA:  Pain status post fall EXAM: CHEST - 2 VIEW COMPARISON:  May 14, 2019 FINDINGS: Again noted are  persistent diffuse patchy bilateral airspace opacification. December prior study and is favored be secondary to a combination atelectasis, scarring, and pleural based plaques. There is cardiomegaly. There are atherosclerotic changes of the thoracic aorta. There is no pneumothorax. IMPRESSION: No acute cardiopulmonary process. Stable cardiomegaly and bilateral calcified pleural based plaques as seen on the patient's prior CTs. Electronically Signed   By: Constance Holster M.D.   On: 09/24/2019 21:16   DG Thoracic Spine 2 View  Result Date: 09/24/2019 CLINICAL DATA:  Pain status post fall EXAM: LUMBAR SPINE - COMPLETE 4+ VIEW; THORACIC SPINE 2 VIEWS COMPARISON:  None. FINDINGS: For the purpose of this report, there are rudimentary ribs at the T12 level. There is probable there is an age-indeterminate compression fracture of the T12 vertebral body resulting in approximately 25-30% height loss. This fracture is new since 02/11/2019. There appears to be some mild height loss of the T11 vertebral body which is new since CT dated 02/11/2019. Evaluation of the mid upper thoracic spine is limited by osteopenia and overlapping osseous structures. Advanced vascular calcifications are noted throughout the abdominal aorta with an infrarenal abdominal aortic aneurysm measuring up to approximately 3.4 cm. There is diffuse osteopenia throughout the visualized portions of the thoracolumbar spine with multilevel degenerative changes and facet arthrosis, especially at the lower lumbar segments. IMPRESSION: 1. Age-indeterminate compression fractures of the T11 and T12 vertebral bodies, new since 2020. 2. The midthoracic spine is suboptimally evaluated secondary to osteopenia and overlapping structures. 3. Infrarenal abdominal aortic aneurysm measuring up to approximately 3.4 cm. Recommend followup by ultrasound in 3 years. This recommendation follows ACR consensus guidelines: White Paper of the ACR Incidental Findings Committee II  on Vascular Findings. J Am Coll Radiol 2013; 10:789-794 Aortic Atherosclerosis (ICD10-I70.0). Electronically Signed   By: Constance Holster M.D.   On: 09/24/2019 21:15   DG Lumbar Spine Complete  Result Date: 09/24/2019 CLINICAL DATA:  Pain status post fall EXAM: LUMBAR SPINE - COMPLETE 4+ VIEW; THORACIC SPINE 2 VIEWS COMPARISON:  None. FINDINGS: For the purpose of this report, there are rudimentary ribs at the T12 level. There is probable there is an age-indeterminate compression fracture of the T12 vertebral body resulting in approximately 25-30% height loss. This fracture is new since 02/11/2019. There appears to be some mild height loss of the T11 vertebral body which is new since CT dated 02/11/2019. Evaluation of the mid upper thoracic spine is limited by osteopenia and overlapping osseous structures. Advanced vascular calcifications are noted throughout the abdominal aorta with an infrarenal abdominal aortic aneurysm measuring up to approximately 3.4 cm. There is diffuse osteopenia throughout the visualized portions of the thoracolumbar spine with multilevel degenerative changes and facet arthrosis, especially at the lower lumbar segments. IMPRESSION: 1. Age-indeterminate compression  fractures of the T11 and T12 vertebral bodies, new since 2020. 2. The midthoracic spine is suboptimally evaluated secondary to osteopenia and overlapping structures. 3. Infrarenal abdominal aortic aneurysm measuring up to approximately 3.4 cm. Recommend followup by ultrasound in 3 years. This recommendation follows ACR consensus guidelines: White Paper of the ACR Incidental Findings Committee II on Vascular Findings. J Am Coll Radiol 2013; 10:789-794 Aortic Atherosclerosis (ICD10-I70.0). Electronically Signed   By: Constance Holster M.D.   On: 09/24/2019 21:15   CT Head Wo Contrast  Result Date: 09/24/2019 CLINICAL DATA:  Status post fall with trauma to the head. EXAM: CT HEAD WITHOUT CONTRAST CT CERVICAL SPINE WITHOUT  CONTRAST TECHNIQUE: Multidetector CT imaging of the head and cervical spine was performed following the standard protocol without intravenous contrast. Multiplanar CT image reconstructions of the cervical spine were also generated. COMPARISON:  August 20, 2017 FINDINGS: CT HEAD FINDINGS Brain: No evidence of acute infarction, hemorrhage, hydrocephalus, extra-axial collection or mass lesion/mass effect. Advanced brain parenchymal volume loss and deep white matter microangiopathy Vascular: Intracranial calcific atherosclerotic disease. Skull: Normal. Negative for fracture or focal lesion. Sinuses/Orbits: No acute finding. Other: None. CT CERVICAL SPINE FINDINGS Alignment: Normal. Skull base and vertebrae: No acute fracture. No primary bone lesion or focal pathologic process. Soft tissues and spinal canal: No prevertebral fluid or swelling. No visible canal hematoma. Disc levels: Multilevel osteoarthritic changes of the cervical spine. Upper chest: Right supraclavicular and superior mediastinal lymphadenopathy. Other: Linear and nodular pleural thickening and calcifications in the bilateral lung apices. Interstitial thickening. IMPRESSION: 1. No acute intracranial abnormality. 2. Advanced brain parenchymal atrophy and chronic microvascular disease. 3. No evidence of acute traumatic injury to cervical spine. 4. Multilevel osteoarthritic changes of the cervical spine. 5. Bulky right supraclavicular and superior mediastinal lymphadenopathy. Please correlate clinically. 6. Linear and nodular pleural thickening and calcifications in the bilateral lung apices. Interstitial thickening. Electronically Signed   By: Fidela Salisbury M.D.   On: 09/24/2019 20:50   CT Cervical Spine Wo Contrast  Result Date: 09/24/2019 CLINICAL DATA:  Status post fall with trauma to the head. EXAM: CT HEAD WITHOUT CONTRAST CT CERVICAL SPINE WITHOUT CONTRAST TECHNIQUE: Multidetector CT imaging of the head and cervical spine was performed  following the standard protocol without intravenous contrast. Multiplanar CT image reconstructions of the cervical spine were also generated. COMPARISON:  August 20, 2017 FINDINGS: CT HEAD FINDINGS Brain: No evidence of acute infarction, hemorrhage, hydrocephalus, extra-axial collection or mass lesion/mass effect. Advanced brain parenchymal volume loss and deep white matter microangiopathy Vascular: Intracranial calcific atherosclerotic disease. Skull: Normal. Negative for fracture or focal lesion. Sinuses/Orbits: No acute finding. Other: None. CT CERVICAL SPINE FINDINGS Alignment: Normal. Skull base and vertebrae: No acute fracture. No primary bone lesion or focal pathologic process. Soft tissues and spinal canal: No prevertebral fluid or swelling. No visible canal hematoma. Disc levels: Multilevel osteoarthritic changes of the cervical spine. Upper chest: Right supraclavicular and superior mediastinal lymphadenopathy. Other: Linear and nodular pleural thickening and calcifications in the bilateral lung apices. Interstitial thickening. IMPRESSION: 1. No acute intracranial abnormality. 2. Advanced brain parenchymal atrophy and chronic microvascular disease. 3. No evidence of acute traumatic injury to cervical spine. 4. Multilevel osteoarthritic changes of the cervical spine. 5. Bulky right supraclavicular and superior mediastinal lymphadenopathy. Please correlate clinically. 6. Linear and nodular pleural thickening and calcifications in the bilateral lung apices. Interstitial thickening. Electronically Signed   By: Fidela Salisbury M.D.   On: 09/24/2019 20:50   DG Hips Bilat W or Wo  Pelvis 3-4 Views  Result Date: 09/24/2019 CLINICAL DATA:  Pain status post fall EXAM: DG HIP (WITH OR WITHOUT PELVIS) 3-4V BILAT COMPARISON:  December 9th 2020. FINDINGS: The patient has undergone prior intramedullary nail placement through the left femur. The hardware is intact. There is no evidence for an acute displaced fracture  or dislocation. Atherosclerotic changes are noted. IMPRESSION: No acute displaced fracture or dislocation. Electronically Signed   By: Constance Holster M.D.   On: 09/24/2019 21:17        Scheduled Meds: . amitriptyline  100 mg Oral QHS  . amLODipine  2.5 mg Oral Daily  . aspirin EC  81 mg Oral Daily  . finasteride  5 mg Oral BH-q7a  . gabapentin  800 mg Oral TID  . heparin  5,000 Units Subcutaneous Q8H  . latanoprost  1 drop Both Eyes QHS  . metoprolol tartrate  12.5 mg Oral BID  . tamsulosin  0.4 mg Oral QHS   Continuous Infusions: . lactated ringers 100 mL/hr at 09/25/19 2041     LOS: 1 day    Time spent: 30 minutes    Yee Gangi Darleen Crocker, DO [facility]  If 7PM-7AM, please contact night-coverage [email or web address] 09/26/2019, 12:26 PM

## 2019-09-26 NOTE — Evaluation (Signed)
Occupational Therapy Evaluation Patient Details Name: Curtis [name] MRN: [id number] DOB: 13-Sep-1945 Today's Date: 09/26/2019    History of Present Illness 74 yo male presenting with fall at home and complaining of low back pain; presenting with age indeterminate T11-T12 compression fx. MRI pending,. 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   Clinical Impression   PTA, pt was living alone and performing BADLs and using RW for mobility; reports his son lives next door and assists with IADLs. Pt currently requiring Mod-Max A for UB ADLs, Max A for LB ADLs, and Mod A for bed mobility. Educating pt on back precautions and management of brace. Pt with increased anxiety about MRI and reports he feels very nervous. Declined OOB activity stating he "wants to wait until after MRI." Pt would benefit from further acute OT to facilitate safe dc. Recommend dc to SNF for further OT to optimize safety, independence with ADLs, and return to PLOF.     Follow Up Recommendations  SNF;Supervision/Assistance - 24 hour    Equipment Recommendations  Other (comment)(Defer to next venue)    Recommendations for Other Services PT consult     Precautions / Restrictions Precautions Precautions: Fall;Back Precaution Booklet Issued: No Precaution Comments: Verbally reviewed back precautions. Pt requiring VCs throughout session Required Braces or Orthoses: Spinal Brace Spinal Brace: Thoracolumbosacral orthotic;Applied in sitting position;Other (comment)("Off for showering") Restrictions Weight Bearing Restrictions: No      Mobility Bed Mobility Overal bed mobility: Needs Assistance Bed Mobility: Rolling;Sidelying to Sit;Sit to Sidelying Rolling: Mod assist Sidelying to sit: Min assist     Sit to sidelying: Mod assist General bed mobility comments: Mod A for rolling and maintaining back precautions. Min A to bring BLEs over EOB before  sitting up and then Mod A for bringing BLEs over EOB  Transfers                 General transfer comment: Pt declined OOB activity at this time    Balance Overall balance assessment: Needs assistance Sitting-balance support: No upper extremity supported;Feet supported Sitting balance-Leahy Scale: Fair                                     ADL either performed or assessed with clinical judgement   ADL Overall ADL's : Needs assistance/impaired Eating/Feeding: Set up;Bed level   Grooming: Set up;Bed level   Upper Body Bathing: Moderate assistance;Bed level;Sitting   Lower Body Bathing: Maximal assistance;Bed level;Sitting/lateral leans   Upper Body Dressing : Maximal assistance;Sitting Upper Body Dressing Details (indicate cue type and reason): Educating pt on brace donning/doffing and positioning. Max A for donning brace. Cues for doffing and pty able to doff with Min A. Lower Body Dressing: Maximal assistance;Sitting/lateral leans;Bed level                 General ADL Comments: Pt performing bed mobility and donning/doffing brace at EOB. Declined to perform functional transfers and OOB activity at this time stating "I would just like to wait till I get this MRI over with."      Vision         Perception     Praxis      Pertinent Vitals/Pain Pain Assessment: Faces Faces Pain Scale: Hurts even more Pain Location: Back Pain Descriptors / Indicators: Constant;Discomfort;Grimacing Pain Intervention(s): Monitored during session;Repositioned;Limited activity within patient's tolerance  Hand Dominance Right   Extremity/Trunk Assessment Upper Extremity Assessment Upper Extremity Assessment: Generalized weakness   Lower Extremity Assessment Lower Extremity Assessment: Defer to PT evaluation   Cervical / Trunk Assessment Cervical / Trunk Assessment: Other exceptions Cervical / Trunk Exceptions: T11-T12 compression fx. MRI pending    Communication Communication Communication: No difficulties   Cognition Arousal/Alertness: Awake/alert Behavior During Therapy: WFL for tasks assessed/performed;Anxious Overall Cognitive Status: Within Functional Limits for tasks assessed                                 General Comments: Pt anxious about pending MRI. Pt with slight tangiental conversation and requiring redirecting cues throughout. Feel this is baseline cognition   General Comments  SpO2 dropping to 80s on 4L with activity. Elevating back to 90s on 4L.    Exercises     Shoulder Instructions      Home Living Family/patient expects to be discharged to:: Skilled nursing facility Living Arrangements: Alone Available Help at Discharge: Family;Available PRN/intermittently(Son lives next door) Type of Home: House(Double wide) Home Access: Ramped entrance;Stairs to enter Entrance Stairs-Number of Steps: 2 Entrance Stairs-Rails: Can reach both;Left;Right Home Layout: One level     Bathroom Shower/Tub: Walk-in shower;Door   [employer] Toilet: Standard Bathroom Accessibility: Yes   Home Equipment: Environmental consultant - 2 wheels;Cane - single point;Shower seat;Bedside commode;Wheelchair - manual   Additional Comments: 4L O2      Prior Functioning/Environment Level of Independence: Needs assistance  Gait / Transfers Assistance Needed: Reports he uses RW for mobility and "I can walk to whole length of the house." ADL's / Homemaking Assistance Needed: "I get dressed on my own".             OT Problem List: Decreased strength;Decreased range of motion;Decreased activity tolerance;Impaired balance (sitting and/or standing);Decreased knowledge of use of DME or AE;Decreased knowledge of precautions;Pain      OT Treatment/Interventions: Self-care/ADL training;Therapeutic exercise;Energy conservation;DME and/or AE instruction;Therapeutic activities;Patient/family education    OT Goals(Current goals can be found in the  care plan section) Acute Rehab OT Goals Patient Stated Goal: No more pain OT Goal Formulation: With patient Time For Goal Achievement: 10/10/19 Potential to Achieve Goals: Good  OT Frequency: Min 2X/week   Barriers to D/C:            Co-evaluation              AM-PAC OT "6 Clicks" Daily Activity     Outcome Measure Help from another person eating meals?: A Little Help from another person taking care of personal grooming?: A Little Help from another person toileting, which includes using toliet, bedpan, or urinal?: A Lot Help from another person bathing (including washing, rinsing, drying)?: A Lot Help from another person to put on and taking off regular upper body clothing?: A Lot Help from another person to put on and taking off regular lower body clothing?: A Lot 6 Click Score: 14   End of Session Equipment Utilized During Treatment: Back brace Nurse Communication: Mobility status  Activity Tolerance: Patient tolerated treatment well Patient left: in bed;with call bell/phone within reach;with bed alarm set  OT Visit Diagnosis: Unsteadiness on feet (R26.81);Other abnormalities of gait and mobility (R26.89);Muscle weakness (generalized) (M62.81);Pain Pain - part of body: (Back)                Time: [id number] OT Time Calculation (min): 33 min Charges:  OT General Charges $OT Visit:  1 Visit OT Evaluation $OT Eval Moderate Complexity: 1 Mod OT Treatments $Self Care/Home Management : 8-22 mins  Savaya Hakes MSOT, OTR/L Acute Rehab Pager: [phone] Office: [place] 09/26/2019, 11:32 AM

## 2019-09-26 NOTE — Progress Notes (Signed)
BP has been elevated yesterday and today.  Contacted Dr. Manuella Ghazi and he placed new BP med orders

## 2019-09-26 NOTE — TOC Initial Note (Signed)
Transition of Care [facility]) - Initial/Assessment Note    Patient Details  Name: Curtis [name] MRN: [id number] Date of Birth: January 27, 1946  [facility] CM/SW Contact:    Salome Arnt, LCSW Phone Number: 09/26/2019, 2:17 PM  Clinical Narrative:  Pt admitted from home. LCSW spoke with pt's son, Elta Guadeloupe as pt did not feel like talking at this time. Elta Guadeloupe reports he lives next door, but pt lives alone. Pt is fairly independent at baseline. He has a walker and a shower chair. Pt is on 4 liters home oxygen through [place]. Elta Guadeloupe assists with household tasks as needed. Pt has been receiving home health wound care through [place]. Elta Guadeloupe states pt fractured his hip a few months ago and went to rehab at [facility]. At this point, d/c plan is for pt to return home. LCSW will continue to follow to assist with any d/c planning needs.                  Expected Discharge Plan: Home/Self Care Barriers to Discharge: Continued Medical Work up   Patient Goals and CMS Choice Patient states their goals for this hospitalization and ongoing recovery are:: return home      Expected Discharge Plan and Services Expected Discharge Plan: Home/Self Care In-house Referral: Clinical Social Work     Living arrangements for the past 2 months: Single Family Home                                      Prior Living Arrangements/Services Living arrangements for the past 2 months: Single Family Home Lives with:: Self Patient language and need for interpreter reviewed:: Yes Do you feel safe going back to the place where you live?: Yes      Need for Family Participation in Patient Care: Yes (Comment) Care giver support system in place?: Yes (comment) Current home services: DME, Home RN(Walker/shower chair. Home health wound care with [facility].) Criminal Activity/Legal Involvement Pertinent to Current Situation/Hospitalization: No - Comment as needed  Activities of Daily Living Home  Assistive Devices/Equipment: Eyeglasses, Oxygen, Walker (specify type) ADL Screening (condition at time of admission) Patient's cognitive ability adequate to safely complete daily activities?: Yes Is the patient deaf or have difficulty hearing?: No Does the patient have difficulty seeing, even when wearing glasses/contacts?: No Does the patient have difficulty concentrating, remembering, or making decisions?: No Patient able to express need for assistance with ADLs?: Yes Does the patient have difficulty dressing or bathing?: No Independently performs ADLs?: No Communication: Independent Dressing (OT): Independent Grooming: Independent Feeding: Independent Bathing: Needs assistance Is this a change from baseline?: Pre-admission baseline Toileting: Needs assistance Is this a change from baseline?: Pre-admission baseline In/Out Bed: Independent with device (comment) Walks in Home: Independent with device (comment) Does the patient have difficulty walking or climbing stairs?: Yes Weakness of Legs: Both Weakness of Arms/Hands: None  Permission Sought/Granted                  Emotional Assessment Appearance:: Appears stated age Attitude/Demeanor/Rapport: Unable to Assess Affect (typically observed): Unable to Assess   Alcohol / Substance Use: Not Applicable Psych Involvement: No (comment)  Admission diagnosis:  Fall at home [W19.Merril Abbe, [id number] Fall, initial encounter [id number]] Fall [W19.XXXA] Patient Active Problem List   Diagnosis Date Noted  . Fall at home 09/25/2019  . Ambulatory dysfunction 09/25/2019  . Fall 09/25/2019  . Pneumonia due to COVID-19 virus  05/14/2019  . Closed left hip fracture ([place]) 04/13/2019  . Hip fracture ([place]) 04/13/2019  . Closed comminuted intertrochanteric fracture of proximal end of left femur ([place])   . Protein-calorie malnutrition, severe ([place]) 03/12/2019  . Chronic diastolic heart failure ([place]) 03/02/2019  . Unstageable pressure ulcer of  left buttock ([place]) 02/21/2019  . Decubitus ulcer of heel, bilateral, unstageable ([place]) 02/21/2019  . Anasarca 02/20/2019  . BPH with urinary obstruction 02/20/2019  . Sacral decubitus ulcer, stage IV ([place])   . Physical deconditioning 02/04/2019  . Pressure injury of deep tissue of left heel 01/27/2019  . Atrial flutter, chronic ([place]) 01/15/2019  . Bilateral open angle glaucoma 01/15/2019  . Anxiety disorder 01/15/2019  . Pressure ulcer of right heel, stage 2 ([place]) 01/03/2019  . Chronic respiratory failure with hypoxia and hypercapnia ([place]) 01/02/2019  . Situational mixed anxiety and depressive disorder 02/28/2018  . Carotid artery disease ([place]) 02/27/2016  . Hereditary and idiopathic peripheral neuropathy 01/30/2015  . Anemia, iron deficiency 05/08/2014  . Urinary retention 05/08/2014  . Shortness of breath   . Hyperlipidemia LDL goal <70 04/20/2014  . AAA (abdominal aortic aneurysm), stable 04/20/2014  . SOB (shortness of breath)   . Peripheral vascular disease ([place])   . Presence of drug coated stent in left circumflex coronary artery: Promus DES 3.5 mm x 38 mm (3.9 mm) 01/17/2014    Class: Acute  . CAD (coronary artery disease), native coronary artery   . SVT (supraventricular tachycardia) s/p ablation 01/16/2014 01/16/2014  . 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  . TIA (transient ischemic attack) 09/06/2013  . HTN (hypertension) 09/05/2013  . TOBACCO ABUSE, hx 08/23/2007  . COPD with chronic bronchitis ([place]) 08/12/2007  . Asbestosis ([place]) 08/12/2007  . Reflex sympathetic dystrophy 05/29/2007  . GERD 05/29/2007   PCP:  [name], MD Pharmacy:   [id number] - [place], [place] - [address] [place] [address] Phone: [phone] Fax: [phone]     Social Determinants of Health (SDOH) Interventions    Readmission Risk Interventions Readmission Risk Prevention Plan 09/26/2019  02/11/2019 01/04/2019  Transportation Screening Complete Complete Complete  PCP or Specialist Appt within 5-7 Days - - -  PCP or Specialist Appt within 3-5 Days - - Complete  Home Care Screening - - -  Medication Review (RN CM) - - -  [place] or [place] - - Complete  Social Work Consult for [place] Planning/Counseling - - Complete  Palliative Care Screening - - Not Applicable  Medication Review (RN Care Manager) Complete Complete Complete  PCP or Specialist appointment within 3-5 days of discharge Not Complete Not Complete -  PCP/Specialist Appt Not Complete comments will complete at d/c. Pt returning to SNF -  [place] or Home Care Consult Complete Not Complete -  [place] or Home Care Consult Pt Refusal Comments - Pt returning to SNF -  [place] Recovery Care/Counseling Consult Not Complete Complete -  Palliative Care Screening Not Applicable Not Applicable -  [place] Not Complete Complete -  SNF Comments PT eval pending - -  Some recent data might be hidden

## 2019-09-27 ENCOUNTER — Inpatient Hospital Stay (HOSPITAL_COMMUNITY): Payer: Medicare Other

## 2019-09-27 LAB — CBC
HCT: 32.7 % — ABNORMAL LOW (ref 39.0–52.0)
Hemoglobin: 9.7 g/dL — ABNORMAL LOW (ref 13.0–17.0)
MCH: 26.4 pg (ref 26.0–34.0)
MCHC: 29.7 g/dL — ABNORMAL LOW (ref 30.0–36.0)
MCV: 88.9 fL (ref 80.0–100.0)
Platelets: 316 10*3/uL (ref 150–400)
RBC: 3.68 MIL/uL — ABNORMAL LOW (ref 4.22–5.81)
RDW: 15.6 % — ABNORMAL HIGH (ref 11.5–15.5)
WBC: 6.7 10*3/uL (ref 4.0–10.5)
nRBC: 0 % (ref 0.0–0.2)

## 2019-09-27 LAB — BASIC METABOLIC PANEL
Anion gap: 11 (ref 5–15)
BUN: 33 mg/dL — ABNORMAL HIGH (ref 8–23)
CO2: 33 mmol/L — ABNORMAL HIGH (ref 22–32)
Calcium: 9.7 mg/dL (ref 8.9–10.3)
Chloride: 93 mmol/L — ABNORMAL LOW (ref 98–111)
Creatinine, Ser: 1.37 mg/dL — ABNORMAL HIGH (ref 0.61–1.24)
GFR calc Af Amer: 58 mL/min — ABNORMAL LOW (ref >60)
GFR calc non Af Amer: 50 mL/min — ABNORMAL LOW (ref >60)
Glucose, Bld: 88 mg/dL (ref 70–99)
Potassium: 4.1 mmol/L (ref 3.5–5.1)
Sodium: 137 mmol/L (ref 135–145)

## 2019-09-27 MED ORDER — LACTATED RINGERS IV SOLN: INTRAVENOUS | Status: AC

## 2019-09-27 NOTE — Progress Notes (Signed)
PROGRESS NOTE    Curtis [name]  [id number] DOB: 04-28-1946 DOA: 09/24/2019 PCP: [name], MD   Brief Narrative:  Per HPI: [name] a 74 y.o.malewith medical history significant ofhypertension, COPD on 4 L of oxygen for chronic respiratory failure, peripheral vascular disease, pulmonary asbestosis and coronary artery disease presented to ED for evaluation of lower back pain secondary to mechanical fall. Patient states that he was walking with his walker and suddenly fell backward on the floor after his walker stopped on the bathroom rug. Patient denies losing consciousness and reports pain in his head, neck, back and hips patient states that he lives alone by himself but his son is next-door and check on him on regular basis. Patient is complaining of lower back pain but denies numbness, weakness, urinary incontinence and stool incontinence. Patient is not on any anticoagulation. Patient also denies fever, chills, chest pain, shortness of breath, nausea, vomiting, abdominal pain urinary symptoms.  -Patient admitted with fall at home and is complaining of low back pain with noted age-indeterminate T11 and T12 vertebral body compression fractures.  MRI performed on 5/25 with what appears to be a new T12 fracture with 35% loss in vertebral height.  Discussed case with IR and patient.  Agree that the best approach would be for full consultation in a.m. and to keep n.p.o. after midnight.  Anticipate possible kyphoplasty as soon as 5/27 after which point, he should be able to return home with home health physical therapy.  Assessment & Plan:   Principal Problem:   Fall at home Active Problems:   COPD with chronic bronchitis ([place])   Hyperlipidemia LDL goal <70   Anemia, iron deficiency   Anxiety disorder   Ambulatory dysfunction   Fall   Recurrent falls with associated recent T12 compression fracture  -MRI performed 5/25 with findings of recent changes and  discussed with IR.  IR and patient both agree that performing procedure while inpatient would be best approach and therefore consultation has been placed and orders have been placed to keep n.p.o. after midnight. -TLSO brace ordered with PT evaluation recommending SNF, however patient declines rehab placement and could likely go home with home health PT after procedure. -Continue current pain management  AKI-improving -Baseline creatinine 0.6-0.7 -Continue further time-limited IV fluid and follow labs -Nonoliguric -Avoid nephrotoxic agents -Follow I's and O's  COPD with chronic bronchitis and chronic hypoxemia -Wears 4-5 L nasal cannula at home -Continue home medications -No acute bronchospasms noted  Iron deficiency anemia -Currently stable  Anxiety disorder -Continue home medications as tolerated  DVT prophylaxis: Heparin Code Status: Partial code (no intubation) Family Communication: Discussed with son over phone 5/25 Disposition Plan:  Plan for kyphoplasty while inpatient and then discharged to home with home PT. Status is: Inpatient  Remains inpatient appropriate because:Ongoing diagnostic testing needed not appropriate for outpatient work up and Unsafe d/c plan   Dispo: The patient is from: Home  Anticipated d/c is to: Home  Anticipated d/c date is: 2 days  Patient currently is not medically stable to d/c.  Current plan is for patient to have kyphoplasty by 5/27 according to IR after discussion today with Dr. Pascal Lux.  Plan to keep n.p.o. overnight with consultation placed.   Consultants:   Consult to IR  Procedures:   None  Antimicrobials:   None   Subjective: Patient seen and evaluated today with ongoing low back pain that appears to have stabilized some.  He still has significant pain with any  movement.  He has been assessed by PT and appears stable for discharge, however he would like to try and have  kyphoplasty performed while inpatient given the severity of his pain.  Objective: Vitals:   09/26/19 2057 09/26/19 2259 09/27/19 0436 09/27/19 1312  BP: (!) 170/93 122/67 135/67 (!) 126/56  Pulse: (!) 117 84 74 95  Resp: 20 20 18 20   Temp: 98.5 F (36.9 C) 98.7 F (37.1 C) 98.9 F (37.2 C) 98.1 F (36.7 C)  TempSrc: Oral Oral Oral Oral  SpO2: 97% 95% 96% 90%  Weight:      Height:       No intake or output data in the 24 hours ending 09/27/19 1629 Filed Weights   09/24/19 1923  Weight: 81.6 kg    Examination:  General exam: Appears calm and comfortable  Respiratory system: Clear to auscultation. Respiratory effort normal.  Currently on 5 L nasal cannula oxygen. Cardiovascular system: S1 & S2 heard, RRR. No JVD, murmurs, rubs, gallops or clicks. No pedal edema. Gastrointestinal system: Abdomen is nondistended, soft and nontender. No organomegaly or masses felt. Normal bowel sounds heard. Central nervous system: Alert and oriented. No focal neurological deficits. Extremities: Symmetric 5 x 5 power. Skin: No rashes, lesions or ulcers Psychiatry: Judgement and insight appear normal. Mood & affect appropriate.     Data Reviewed: I have personally reviewed following labs and imaging studies  CBC: Recent Labs  Lab 09/24/19 2215 09/26/19 0546 09/27/19 0605  WBC 12.1* 10.0 6.7  NEUTROABS 10.6*  --   --   HGB 10.7* 9.7* 9.7*  HCT 36.3* 32.3* 32.7*  MCV 88.1 86.4 88.9  PLT 349 328 [id number]   Basic Metabolic Panel: Recent Labs  Lab 09/24/19 2215 09/26/19 0546 09/27/19 0605  NA 140 138 137  K 5.1 4.0 4.1  CL 96* 95* 93*  CO2 32 34* 33*  GLUCOSE 122* 89 88  BUN 22 32* 33*  CREATININE 1.59* 1.46* 1.37*  CALCIUM 10.4* 9.7 9.7   GFR: Estimated Creatinine Clearance: 51.1 mL/min (A) (by C-G formula based on SCr of 1.37 mg/dL (H)). Liver Function Tests: Recent Labs  Lab 09/24/19 2215  AST 19  ALT 12  ALKPHOS 101  BILITOT 0.4  PROT 8.5*  ALBUMIN 3.2*   No results  for input(s): LIPASE, AMYLASE in the last 168 hours. No results for input(s): AMMONIA in the last 168 hours. Coagulation Profile: No results for input(s): INR, PROTIME in the last 168 hours. Cardiac Enzymes: No results for input(s): CKTOTAL, CKMB, CKMBINDEX, TROPONINI in the last 168 hours. BNP (last 3 results) No results for input(s): PROBNP in the last 8760 hours. HbA1C: No results for input(s): HGBA1C in the last 72 hours. CBG: Recent Labs  Lab 09/25/19 0859  GLUCAP 157*   Lipid Profile: No results for input(s): CHOL, HDL, LDLCALC, TRIG, CHOLHDL, LDLDIRECT in the last 72 hours. Thyroid Function Tests: No results for input(s): TSH, T4TOTAL, FREET4, T3FREE, THYROIDAB in the last 72 hours. Anemia Panel: No results for input(s): VITAMINB12, FOLATE, FERRITIN, TIBC, IRON, RETICCTPCT in the last 72 hours. Sepsis Labs: No results for input(s): PROCALCITON, LATICACIDVEN in the last 168 hours.  Recent Results (from the past 240 hour(s))  SARS Coronavirus 2 by RT PCR (hospital order, performed in [facility] hospital lab) Nasopharyngeal Nasopharyngeal Swab     Status: None   Collection Time: 09/25/19  1:28 AM   Specimen: Nasopharyngeal Swab  Result Value Ref Range Status   SARS Coronavirus 2 NEGATIVE NEGATIVE Final  Comment: (NOTE) SARS-CoV-2 target nucleic acids are NOT DETECTED. The SARS-CoV-2 RNA is generally detectable in upper and lower respiratory specimens during the acute phase of infection. The lowest concentration of SARS-CoV-2 viral copies this assay can detect is 250 copies / mL. A negative result does not preclude SARS-CoV-2 infection and should not be used as the sole basis for treatment or other patient management decisions.  A negative result may occur with improper specimen collection / handling, submission of specimen other than nasopharyngeal swab, presence of viral mutation(s) within the areas targeted by this assay, and inadequate number of viral copies (<250  copies / mL). A negative result must be combined with clinical observations, patient history, and epidemiological information. Fact Sheet for Patients:   StrictlyIdeas.no Fact Sheet for Healthcare Providers: BankingDealers.co.za This test is not yet approved or cleared  by the [place] FDA and has been authorized for detection and/or diagnosis of SARS-CoV-2 by FDA under an Emergency Use Authorization (EUA).  This EUA will remain in effect (meaning this test can be used) for the duration of the COVID-19 declaration under Section 564(b)(1) of the Act, 21 U.S.C. section 360bbb-3(b)(1), unless the authorization is terminated or revoked sooner. Performed at [facility], [address] [place],  [address]          Radiology Studies: MR LUMBAR SPINE WO CONTRAST  Result Date: 09/27/2019 CLINICAL DATA:  Low back pain after a recent fall. EXAM: MRI LUMBAR SPINE WITHOUT CONTRAST TECHNIQUE: Multiplanar, multisequence MR imaging of the lumbar spine was performed. No intravenous contrast was administered. COMPARISON:  Lumbar spine radiographs 09/24/2019. CT abdomen and pelvis 02/08/2019. FINDINGS: The study is mildly motion degraded. Segmentation: Standard. Alignment: Mildly exaggerated lumbar lordosis. Trace anterolisthesis of L4 on L5. Vertebrae: There is a T12 compression fracture with 35% height loss, a visible fracture line including a fluid-filled fracture cleft, mild marrow edema, and 2 mm retropulsion of the posterior vertebral body. The T11 vertebral body and lumbar vertebral bodies are preserved in height. No suspicious osseous lesion is identified. A hemangioma is noted in the L1 vertebral body. Small Schmorl's nodes involve the T10 inferior and L1 superior endplates. Conus medullaris and cauda equina: Conus extends to the lower L1 level. Conus and cauda equina appear normal. Paraspinal and other soft tissues: Mild lower thoracic  paravertebral soft tissue edema. 2 cm T2 hyperintense focus in the right kidney, likely a cyst. 3 cm abdominal aortic aneurysm, similar to the prior CT. Disc levels: T11-12: Minimal T12 retropulsion without stenosis. T12-L1: Negative. L1-2: Small left foraminal disc protrusion without stenosis. L2-3: Mild disc bulging and mild facet hypertrophy without stenosis. L3-4: Moderate disc space narrowing. Right eccentric disc bulging, endplate spurring, and mild facet hypertrophy result in borderline to mild right lateral recess stenosis and mild-to-moderate right neural foraminal stenosis without spinal stenosis. L4-5: Trace anterolisthesis with minimal bulging of uncovered disc, congenitally short pedicles, and severe facet and ligamentum flavum hypertrophy result in moderate spinal stenosis, moderate bilateral lateral recess stenosis, and mild bilateral neural foraminal stenosis. The L5 nerve roots may be affected in the lateral recesses. L5-S1: Severe facet arthrosis without stenosis. IMPRESSION: 1. Recent/unhealed T12 compression fracture with 35% height loss. 2. Multilevel lumbar disc and facet degeneration, most notable at L4-5 where there is moderate spinal stenosis and lateral recess stenosis. 3. Known 3 cm abdominal aortic aneurysm. Electronically Signed   By: Logan Bores M.D.   On: 09/27/2019 13:50        Scheduled Meds: . amitriptyline  100  mg Oral QHS  . amLODipine  5 mg Oral Daily  . aspirin EC  81 mg Oral Daily  . finasteride  5 mg Oral BH-q7a  . gabapentin  600 mg Oral TID  . heparin  5,000 Units Subcutaneous Q8H  . latanoprost  1 drop Both Eyes QHS  . metoprolol tartrate  12.5 mg Oral BID  . tamsulosin  0.4 mg Oral QHS   Continuous Infusions:   LOS: 2 days    Time spent: 35 minutes    Verneice Caspers Darleen Crocker, DO [facility]  If 7PM-7AM, please contact night-coverage [email or web address] 09/27/2019, 4:29 PM

## 2019-09-27 NOTE — TOC Progression Note (Signed)
Transition of Care [facility]) - Progression Note   Patient Details  Name: Curtis [name] MRN: [id number] Date of Birth: 05-31-1945  Transition of Care [facility]) CM/SW [place], LCSW Phone Number: 09/27/2019, 7:18 PM  Clinical Narrative:  Per PT and OT consults, it is recommended that patient discharge to SNF. However, patient does not want to go to SNF. CSW called and left voicemail for the patient's son, Philipe Rought, and daughter-in-law, Holdon Cordner, regarding family support in order for the patient to discharge home with [facility] rather than go to SNF. CSW requested call back. CSW received call back from son. Per son, he knows the patient wants to return home and stated, "as long as he's in his right mind, he can choose." Son reported he checks on the patient after work every day and the daughter-in-law works from home Monday-Wednesday and lives next door, so she can easily check on him. Son reported patient also has a Life Alert button in case he does have a fall, but the patient only does not have someone readily available on Thursday and Friday during working hours.  Expected Discharge Plan: Home/Self Care Barriers to Discharge: Continued Medical Work up  Expected Discharge Plan and Services Expected Discharge Plan: Home/Self Care In-house Referral: Clinical Social Work Living arrangements for the past 2 months: Single Family Home  Readmission Risk Interventions Readmission Risk Prevention Plan 09/26/2019 02/11/2019 01/04/2019  Transportation Screening Complete Complete Complete  PCP or Specialist Appt within 5-7 Days - - -  PCP or Specialist Appt within 3-5 Days - - Complete  Home Care Screening - - -  Medication Review (RN CM) - - -  [place] or [place] - - Complete  Social Work Consult for [place] Planning/Counseling - - Complete  Palliative Care Screening - - Not Applicable  Medication Review (RN Care Manager) Complete Complete Complete  PCP or Specialist appointment  within 3-5 days of discharge Not Complete Not Complete -  PCP/Specialist Appt Not Complete comments will complete at d/c. Pt returning to SNF -  [place] or Home Care Consult Complete Not Complete -  HRI or Home Care Consult Pt Refusal Comments - Pt returning to SNF -  SW Recovery Care/Counseling Consult Not Complete Complete -  Palliative Care Screening Not Applicable Not Applicable -  [place] Not Complete Complete -  SNF Comments PT eval pending - -  Some recent data might be hidden

## 2019-09-27 NOTE — TOC Progression Note (Signed)
Transition of Care [facility]) - Progression Note    Patient Details  Name: RAVINDRA ALTSCHULER MRN: [id number] Date of Birth: 09/25/1945  Transition of Care [facility]) CM/SW Contact  Salome Arnt, [place] Phone Number: 09/27/2019, 10:05 AM  Clinical Narrative:  OT recommending SNF. PT evaluation pending- pt refused until after MRI. LCSW discussed SNF placement with pt who absolutely refuses to consider at this time. He states his home health is all he needs. Pt aware to notify LCSW if he changes his mind.      Expected Discharge Plan: Home/Self Care Barriers to Discharge: Continued Medical Work up  Expected Discharge Plan and Services Expected Discharge Plan: Home/Self Care In-house Referral: Clinical Social Work     Living arrangements for the past 2 months: Single Family Home                                       Social Determinants of Health (SDOH) Interventions    Readmission Risk Interventions Readmission Risk Prevention Plan 09/26/2019 02/11/2019 01/04/2019  Transportation Screening Complete Complete Complete  PCP or Specialist Appt within 5-7 Days - - -  PCP or Specialist Appt within 3-5 Days - - Complete  Home Care Screening - - -  Medication Review (RN CM) - - -  [place] or [place] - - Complete  Social Work Consult for [place] Planning/Counseling - - Complete  Palliative Care Screening - - Not Applicable  Medication Review (RN Care Manager) Complete Complete Complete  PCP or Specialist appointment within 3-5 days of discharge Not Complete Not Complete -  PCP/Specialist Appt Not Complete comments will complete at d/c. Pt returning to SNF -  [place] or Home Care Consult Complete Not Complete -  HRI or Home Care Consult Pt Refusal Comments - Pt returning to SNF -  SW Recovery Care/Counseling Consult Not Complete Complete -  Palliative Care Screening Not Applicable Not Applicable -  [place] Not Complete Complete -  SNF Comments PT eval  pending - -  Some recent data might be hidden

## 2019-09-27 NOTE — Progress Notes (Signed)
OT Cancellation Note  Patient Details Name: MASHAUN MUMAW MRN: [id number] DOB: 1946-02-11   Cancelled Treatment:    Reason Eval/Treat Not Completed: Patient declined. Pt stating he is not doing anything until after his MRI. Will check back tomorrow.    Guadelupe Sabin, OTR/L  [phone] 09/27/2019, 8:28 AM

## 2019-09-27 NOTE — Evaluation (Signed)
Physical Therapy Evaluation Patient Details Name: Curtis [name] MRN: [id number] DOB: November 28, 1945 Today's Date: 09/27/2019   History of Present Illness  74 yo male presenting with fall at home and complaining of low back pain; presenting with age indeterminate T11-T12 compression fx. MRI pending,. 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    Clinical Impression  Patient functioning near baseline for functional mobility and gait, apprehensive to get up requiring encouragement, demonstrates good return for getting out of/into bed without expression of back discomfort, demonstrates good return for transferring to raised toilet seat in bathroom with supervision and limited to ambulation in room due to c/o fatigue - on 4 LPM O2 during visit.  Patient requested to go back to bed after therapy.  Patient will benefit from continued physical therapy in hospital and recommended venue below to increase strength, balance, endurance for safe ADLs and gait.     Follow Up Recommendations SNF;Supervision - Intermittent;Supervision for mobility/OOB    Equipment Recommendations  None recommended by PT    Recommendations for Other Services       Precautions / Restrictions Precautions Precautions: Fall Required Braces or Orthoses: Spinal Brace Spinal Brace: Thoracolumbosacral orthotic;Applied in sitting position;Other (comment) Restrictions Weight Bearing Restrictions: No      Mobility  Bed Mobility Overal bed mobility: Modified Independent             General bed mobility comments: demonstrates good return for rolling to side and sitting up from sidelying position  Transfers Overall transfer level: Needs assistance Equipment used: Rolling walker (2 wheeled) Transfers: Sit to/from [employer] Transfers Sit to Stand: Supervision Stand pivot transfers: Supervision       General transfer comment: slightly labored  movement, no loss of balance  Ambulation/Gait Ambulation/Gait assistance: Supervision Gait Distance (Feet): 15 Feet Assistive device: Rolling walker (2 wheeled) Gait Pattern/deviations: Decreased step length - right;Decreased step length - left;Decreased stride length Gait velocity: decreased   General Gait Details: limited to ambulation to bathroom and back to bedside with slow labored cadence without loss of balance  Stairs            Wheelchair Mobility    Modified Rankin (Stroke Patients Only)       Balance Overall balance assessment: Needs assistance Sitting-balance support: Feet supported;No upper extremity supported Sitting balance-Leahy Scale: Good Sitting balance - Comments: seated at EOB   Standing balance support: During functional activity;Bilateral upper extremity supported Standing balance-Leahy Scale: Fair Standing balance comment: fair/good using RW                             Pertinent Vitals/Pain Pain Assessment: Faces Faces Pain Scale: Hurts a little bit Pain Location: mid back Pain Descriptors / Indicators: Sore;Discomfort Pain Intervention(s): Limited activity within patient's tolerance;Monitored during session;Repositioned    Home Living Family/patient expects to be discharged to:: Private residence Living Arrangements: Alone Available Help at Discharge: Family;Available PRN/intermittently Type of Home: House Home Access: Ramped entrance;Stairs to enter Entrance Stairs-Rails: Can reach both;Left;Right Entrance Stairs-Number of Steps: 2 Home Layout: One level Home Equipment: Walker - 2 wheels;Cane - single point;Shower seat;Bedside commode;Wheelchair - manual;Grab bars - toilet Additional Comments: 4L O2    Prior Function Level of Independence: Needs assistance   Gait / Transfers Assistance Needed: Household ambulator using RW  ADL's / Homemaking Assistance Needed: Family assists with community ADLs, has home visiting nurse  for wound  dressing on his left heel 3x/week        Hand Dominance   Dominant Hand: Right    Extremity/Trunk Assessment   Upper Extremity Assessment Upper Extremity Assessment: Defer to OT evaluation    Lower Extremity Assessment Lower Extremity Assessment: Generalized weakness    Cervical / Trunk Assessment Cervical / Trunk Assessment: Kyphotic Cervical / Trunk Exceptions: T12 compression fracture  Communication   Communication: No difficulties  Cognition Arousal/Alertness: Awake/alert Behavior During Therapy: WFL for tasks assessed/performed Overall Cognitive Status: Within Functional Limits for tasks assessed                                 General Comments: Patient requires encourgement to get up      General Comments      Exercises     Assessment/Plan    PT Assessment Patient needs continued PT services  PT Problem List Decreased strength;Decreased activity tolerance;Decreased balance;Decreased mobility       PT Treatment Interventions Gait training;Stair training;Functional mobility training;Therapeutic activities;Therapeutic exercise;Patient/family education    PT Goals (Current goals can be found in the Care Plan section)  Acute Rehab PT Goals Patient Stated Goal: return home with family to assist PT Goal Formulation: With patient Time For Goal Achievement: 10/11/19 Potential to Achieve Goals: Good    Frequency Min 3X/week   Barriers to discharge        Co-evaluation               AM-PAC PT "6 Clicks" Mobility  Outcome Measure Help needed turning from your back to your side while in a flat bed without using bedrails?: None Help needed moving from lying on your back to sitting on the side of a flat bed without using bedrails?: None Help needed moving to and from a bed to a chair (including a wheelchair)?: A Little Help needed standing up from a chair using your arms (e.g., wheelchair or bedside chair)?: A Little Help needed  to walk in hospital room?: A Little Help needed climbing 3-5 steps with a railing? : A Lot 6 Click Score: 19    End of Session Equipment Utilized During Treatment: Oxygen;Back brace Activity Tolerance: Patient tolerated treatment well;Patient limited by fatigue Patient left: in bed;with call bell/phone within reach;with bed alarm set Nurse Communication: Mobility status PT Visit Diagnosis: Unsteadiness on feet (R26.81);Other abnormalities of gait and mobility (R26.89);Muscle weakness (generalized) (M62.81)    Time: 1459-1530 PT Time Calculation (min) (ACUTE ONLY): 31 min   Charges:   PT Evaluation $PT Eval Moderate Complexity: 1 Mod PT Treatments $Therapeutic Activity: 23-37 mins        4:01 PM, 09/27/19 Lonell Grandchild, MPT Physical Therapist with [facility] [phone] office [phone] mobile phone

## 2019-09-27 NOTE — Progress Notes (Signed)
PT Cancellation Note  Patient Details Name: Curtis [name] MRN: [id number] DOB: [birth year]   Cancelled Treatment:    Reason Eval/Treat Not Completed: Patient declined, no reason specified.  Patient refused therapy until MRI completed - RN notified.   9:48 AM, 09/27/19 Lonell Grandchild, MPT Physical Therapist with [facility] [phone] office [phone] mobile phone

## 2019-09-27 NOTE — Plan of Care (Signed)
  Problem: Acute Rehab PT Goals(only PT should resolve) Goal: Pt Will Go Supine/Side To Sit Outcome: Progressing Flowsheets (Taken 09/27/2019 1602) Pt will go Supine/Side to Sit:  Independently  with modified independence Goal: Patient Will Transfer Sit To/From Stand Outcome: Progressing Flowsheets (Taken 09/27/2019 1602) Patient will transfer sit to/from stand: with modified independence Goal: Pt Will Transfer Bed To Chair/Chair To Bed Outcome: Progressing Flowsheets (Taken 09/27/2019 1602) Pt will Transfer Bed to Chair/Chair to Bed: with modified independence Goal: Pt Will Ambulate Outcome: Progressing Flowsheets (Taken 09/27/2019 1602) Pt will Ambulate:  50 feet  with supervision  with modified independence  with rolling walker   4:02 PM, 09/27/19 Lonell Grandchild, MPT Physical Therapist with [facility] [phone] office [phone] mobile phone

## 2019-09-28 DIAGNOSIS — S22000A Wedge compression fracture of unspecified thoracic vertebra, initial encounter for closed fracture: Secondary | ICD-10-CM

## 2019-09-28 DIAGNOSIS — L8990 Pressure ulcer of unspecified site, unspecified stage: Secondary | ICD-10-CM | POA: Insufficient documentation

## 2019-09-28 DIAGNOSIS — L8942 Pressure ulcer of contiguous site of back, buttock and hip, stage 2: Secondary | ICD-10-CM

## 2019-09-28 DIAGNOSIS — J449 Chronic obstructive pulmonary disease, unspecified: Secondary | ICD-10-CM

## 2019-09-28 DIAGNOSIS — R262 Difficulty in walking, not elsewhere classified: Secondary | ICD-10-CM

## 2019-09-28 DIAGNOSIS — J9611 Chronic respiratory failure with hypoxia: Secondary | ICD-10-CM

## 2019-09-28 DIAGNOSIS — F411 Generalized anxiety disorder: Secondary | ICD-10-CM

## 2019-09-28 LAB — BASIC METABOLIC PANEL
Anion gap: 9 (ref 5–15)
BUN: 27 mg/dL — ABNORMAL HIGH (ref 8–23)
CO2: 33 mmol/L — ABNORMAL HIGH (ref 22–32)
Calcium: 9.5 mg/dL (ref 8.9–10.3)
Chloride: 93 mmol/L — ABNORMAL LOW (ref 98–111)
Creatinine, Ser: 1.36 mg/dL — ABNORMAL HIGH (ref 0.61–1.24)
GFR calc Af Amer: 59 mL/min — ABNORMAL LOW (ref >60)
GFR calc non Af Amer: 51 mL/min — ABNORMAL LOW (ref >60)
Glucose, Bld: 92 mg/dL (ref 70–99)
Potassium: 3.9 mmol/L (ref 3.5–5.1)
Sodium: 135 mmol/L (ref 135–145)

## 2019-09-28 LAB — CBC
HCT: 30.3 % — ABNORMAL LOW (ref 39.0–52.0)
Hemoglobin: 9.1 g/dL — ABNORMAL LOW (ref 13.0–17.0)
MCH: 26.2 pg (ref 26.0–34.0)
MCHC: 30 g/dL (ref 30.0–36.0)
MCV: 87.3 fL (ref 80.0–100.0)
Platelets: 269 10*3/uL (ref 150–400)
RBC: 3.47 MIL/uL — ABNORMAL LOW (ref 4.22–5.81)
RDW: 15.5 % (ref 11.5–15.5)
WBC: 7.2 10*3/uL (ref 4.0–10.5)
nRBC: 0 % (ref 0.0–0.2)

## 2019-09-28 LAB — MRSA PCR SCREENING: MRSA by PCR: POSITIVE — AB

## 2019-09-28 MED ORDER — CHLORHEXIDINE GLUCONATE CLOTH 2 % EX PADS: 6.0000 | MEDICATED_PAD | Freq: Every day | CUTANEOUS | Status: DC

## 2019-09-28 MED ORDER — MUPIROCIN 2 % EX OINT
1.0000 "application " | TOPICAL_OINTMENT | Freq: Two times a day (BID) | CUTANEOUS | Status: DC
  Administered 2019-09-28 – 2019-09-30 (×5): 1 via NASAL
  Filled 2019-09-28: qty 22

## 2019-09-28 NOTE — Progress Notes (Signed)
PROGRESS NOTE    Curtis [name]  [id number] DOB: 06-25-1945 DOA: 09/24/2019 PCP: [name], MD   Brief Narrative:  Per HPI: Curtis [name] a 74 y.o.malewith medical history significant ofhypertension, COPD on 4 L of oxygen for chronic respiratory failure, peripheral vascular disease, pulmonary asbestosis and coronary artery disease presented to ED for evaluation of lower back pain secondary to mechanical fall. Patient states that he was walking with his walker and suddenly fell backward on the floor after his walker stopped on the bathroom rug. Patient denies losing consciousness and reports pain in his head, neck, back and hips patient states that he lives alone by himself but his son is next-door and check on him on regular basis. Patient is complaining of lower back pain but denies numbness, weakness, urinary incontinence and stool incontinence. Patient is not on any anticoagulation. Patient also denies fever, chills, chest pain, shortness of breath, nausea, vomiting, abdominal pain urinary symptoms.  -Patient admitted with fall at home and is complaining of low back pain with noted age-indeterminate T11 and T12 vertebral body compression fractures.  MRI performed on 5/25 with what appears to be a new T12 fracture with 35% loss in vertebral height.  Discussed case with IR and patient.  Agree that the best approach would be for full consultation in a.m. and to keep n.p.o. after midnight.  Anticipate possible kyphoplasty as soon as 5/27 after which point, he should be able to return home with home health physical therapy.  Assessment & Plan:   Principal Problem:   Fall at home Active Problems:   COPD with chronic bronchitis ([place])   Hyperlipidemia LDL goal <70   Anemia, iron deficiency   Anxiety disorder   Ambulatory dysfunction   Fall   Pressure injury of skin   Recurrent falls with associated recent T12 compression fracture  -MRI performed 5/25 with findings of  recent changes and discussed with IR.  IR and patient both agree that performing procedure while inpatient would be best approach and therefore consultation has been placed and orders have been placed to keep n.p.o. after midnight for anticipated intervention on 09/29/2019 (kyphoplasty). -TLSO brace ordered with PT evaluation recommending SNF, however patient declines rehab placement and would likely go home with home health PT after procedure. 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 Disposition Plan:  Plan for kyphoplasty while inpatient and then discharged to home with home PT.  Status is: Inpatient  Remains inpatient appropriate because: Ongoing diagnostic testing needed not appropriate for outpatient work up and Unsafe d/c plan.  Anticipated kyphoplasty procedure on 09/29/2019.   Dispo: The patient is from: Home  Anticipated d/c is to: Home with home health services.  Anticipated d/c date is: 09/30/19  Patient currently is not medically stable to d/c.  Current plan is for patient to have kyphoplasty by 5/27 according to IR after discussion today with Dr. Pascal Lux.  Plan to keep n.p.o. after midnight.  Continue as needed pain medications.   Consultants:   Consult to IR  Procedures:  None  Antimicrobials:   None   Subjective: No fever, no chest pain, no nausea, no vomiting.   Continues to have significant intractable pain in his lower back with any type of movement and inability to bear weight.  Objective: Vitals:   09/27/19 1312 09/27/19 2123 09/28/19 0431 09/28/19 1400  BP: (!) 126/56 (!) 152/63 (!) 171/82 (!) 149/74  Pulse: 95 92 88 92  Resp: 20 20 20 18   Temp: 98.1 F (36.7 C) 98.2 F (36.8 C) 98.4 F (36.9 C) 98.9 F (37.2 C)  TempSrc: Oral Oral Oral Oral  SpO2: 90% 94% 96% 92%  Weight:      Height:        Intake/Output Summary (Last 24 hours) at 09/28/2019 1547 Last data filed at 09/28/2019 1300 Gross per 24 hour  Intake 1203.34 ml  Output 1750 ml  Net -546.66 ml   Filed Weights   09/24/19 1923  Weight: 81.6 kg    Examination: General exam: Alert, awake, oriented x 3; denies chest pain, shortness of breath, nausea vomiting.  Continues present pain in his back with any movement and having difficulty bearing weight. Respiratory system: Clear to auscultation. Respiratory effort normal. Cardiovascular system:RRR. No murmurs, rubs, gallops. Gastrointestinal system: Abdomen is nondistended, soft and nontender. No organomegaly or masses felt. Normal bowel sounds heard. Central nervous system: Alert and oriented. No focal neurological deficits. Extremities: No C/C/E, +pedal pulses Skin: No petechiae; multiple stage I and stage II pressure injury appreciated in his sacrum and bilateral heels; present on admission.  No signs of superimposed infection. Psychiatry: Judgement and insight appear normal. Mood & affect appropriate.    Data Reviewed: I have personally reviewed following labs and imaging studies  CBC: Recent Labs  Lab 09/24/19 2215 09/26/19 0546 09/27/19 0605 09/28/19 0546  WBC 12.1* 10.0 6.7 7.2  NEUTROABS 10.6*  --   --   --   HGB 10.7* 9.7* 9.7* 9.1*  HCT 36.3* 32.3* 32.7* 30.3*  MCV 88.1 86.4 88.9 87.3  PLT 349 328 316 [id number]   Basic Metabolic Panel: Recent Labs  Lab 09/24/19 2215 09/26/19 0546 09/27/19 0605  09/28/19 0546  NA 140 138 137 135  K 5.1 4.0 4.1 3.9  CL 96* 95* 93* 93*  CO2 32 34* 33* 33*  GLUCOSE 122* 89 88 92  BUN 22 32* 33* 27*  CREATININE 1.59* 1.46* 1.37* 1.36*  CALCIUM 10.4* 9.7 9.7 9.5   GFR: Estimated Creatinine Clearance: 51.5 mL/min (A) (by C-G formula based on SCr of 1.36 mg/dL (H)).   Liver Function Tests: Recent Labs  Lab 09/24/19 2215  AST 19  ALT 12  ALKPHOS 101  BILITOT 0.4  PROT 8.5*  ALBUMIN 3.2*   CBG: Recent Labs  Lab 09/25/19 0859  GLUCAP 157*     Recent Results (from the past 240 hour(s))  SARS Coronavirus 2 by RT PCR (hospital order, performed in [facility] hospital lab) Nasopharyngeal Nasopharyngeal Swab     Status: None   Collection Time: 09/25/19  1:28 AM   Specimen: Nasopharyngeal Swab  Result Value Ref Range Status   SARS Coronavirus 2 NEGATIVE NEGATIVE Final    Comment: (NOTE) SARS-CoV-2 target nucleic acids are NOT DETECTED. The SARS-CoV-2 RNA is generally detectable in upper and lower respiratory specimens during the acute phase of infection. The lowest concentration of SARS-CoV-2 viral copies this assay can detect is 250 copies / mL. A negative result does not preclude SARS-CoV-2 infection and should not be used as the sole basis  for treatment or other patient management decisions.  A negative result may occur with improper specimen collection / handling, submission of specimen other than nasopharyngeal swab, presence of viral mutation(s) within the areas targeted by this assay, and inadequate number of viral copies (<250 copies / mL). A negative result must be combined with clinical observations, patient history, and epidemiological information. Fact Sheet for Patients:   StrictlyIdeas.no Fact Sheet for Healthcare Providers: BankingDealers.co.za This test is not yet approved or cleared  by the [place] FDA and has been authorized for detection and/or diagnosis of  SARS-CoV-2 by FDA under an Emergency Use Authorization (EUA).  This EUA will remain in effect (meaning this test can be used) for the duration of the COVID-19 declaration under Section 564(b)(1) of the Act, 21 U.S.C. section 360bbb-3(b)(1), unless the authorization is terminated or revoked sooner. Performed at [facility], [address] [place], [place] [address]   MRSA PCR Screening     Status: Abnormal   Collection Time: 09/28/19 11:05 AM   Specimen: Nasopharyngeal  Result Value Ref Range Status   MRSA by PCR POSITIVE (A) NEGATIVE Final    Comment:        The GeneXpert MRSA Assay (FDA approved for NASAL specimens only), is one component of a comprehensive MRSA colonization surveillance program. It is not intended to diagnose MRSA infection nor to guide or monitor treatment for MRSA infections. BENGTSON P. AT 1350 ON [id number] BY THOMPSON S. Performed at [facility], [address], [place], [place] [address]      Radiology Studies: MR LUMBAR SPINE WO CONTRAST  Result Date: 09/27/2019 CLINICAL DATA:  Low back pain after a recent fall. EXAM: MRI LUMBAR SPINE WITHOUT CONTRAST TECHNIQUE: Multiplanar, multisequence MR imaging of the lumbar spine was performed. No intravenous contrast was administered. COMPARISON:  Lumbar spine radiographs 09/24/2019. CT abdomen and pelvis 02/08/2019. FINDINGS: The study is mildly motion degraded. Segmentation: Standard. Alignment: Mildly exaggerated lumbar lordosis. Trace anterolisthesis of L4 on L5. Vertebrae: There is a T12 compression fracture with 35% height loss, a visible fracture line including a fluid-filled fracture cleft, mild marrow edema, and 2 mm retropulsion of the posterior vertebral body. The T11 vertebral body and lumbar vertebral bodies are preserved in height. No suspicious osseous lesion is identified. A hemangioma is noted in the L1 vertebral body. Small Schmorl's nodes involve the T10 inferior and L1 superior endplates. Conus  medullaris and cauda equina: Conus extends to the lower L1 level. Conus and cauda equina appear normal. Paraspinal and other soft tissues: Mild lower thoracic paravertebral soft tissue edema. 2 cm T2 hyperintense focus in the right kidney, likely a cyst. 3 cm abdominal aortic aneurysm, similar to the prior CT. Disc levels: T11-12: Minimal T12 retropulsion without stenosis. T12-L1: Negative. L1-2: Small left foraminal disc protrusion without stenosis. L2-3: Mild disc bulging and mild facet hypertrophy without stenosis. L3-4: Moderate disc space narrowing. Right eccentric disc bulging, endplate spurring, and mild facet hypertrophy result in borderline to mild right lateral recess stenosis and mild-to-moderate right neural foraminal stenosis without spinal stenosis. L4-5: Trace anterolisthesis with minimal bulging of uncovered disc, congenitally short pedicles, and severe facet and ligamentum flavum hypertrophy result in moderate spinal stenosis, moderate bilateral lateral recess stenosis, and mild bilateral neural foraminal stenosis. The L5 nerve roots may be affected in the lateral recesses. L5-S1: Severe facet arthrosis without stenosis. IMPRESSION: 1. Recent/unhealed T12 compression fracture with 35% height loss. 2. Multilevel lumbar disc and facet degeneration, most notable at L4-5 where there is  moderate spinal stenosis and lateral recess stenosis. 3. Known 3 cm abdominal aortic aneurysm. Electronically Signed   By: Logan Bores M.D.   On: 09/27/2019 13:50    Scheduled Meds: . amitriptyline  100 mg Oral QHS  . amLODipine  5 mg Oral Daily  . aspirin EC  81 mg Oral Daily  . [START ON 09/29/2019] Chlorhexidine Gluconate Cloth  6 each Topical Q0600  . finasteride  5 mg Oral BH-q7a  . gabapentin  600 mg Oral TID  . heparin  5,000 Units Subcutaneous Q8H  . latanoprost  1 drop Both Eyes QHS  . metoprolol tartrate  12.5 mg Oral BID  . mupirocin ointment  1 application Nasal BID  . tamsulosin  0.4 mg Oral QHS    Continuous Infusions:   LOS: 3 days    Time spent: 30 minutes    Barton Dubois, MD [facility]  If 7PM-7AM, please contact night-coverage [email or web address] 09/28/2019, 3:47 PM

## 2019-09-28 NOTE — Progress Notes (Signed)
NIR requested by Dr. Manuella Ghazi for possible image-guided T12 kyphoplasty/vertebroplasty.  Case/images have been reviewed by Dr. Karenann Cai who approves procedure. Plan for image-guided T12 kyphoplasty/vertebroplasty tentatively for tomorrow 09/29/2019 in IR at [facility] with Dr. Karenann Cai. Patient will be NPO at midnight. Heparin will be held at midnight. INR pending. Mardene Celeste, RN aware to arrange [facility] round trip- arrive at 0900 for 1000 procedure. Patient will be seen/consented in [facility] IR prior to procedure- per RN patient able to sign his own consents.  Please call NIR with questions/concerns.   Bea Graff Rickard Kennerly, PA-C 09/28/2019, 11:02 AM

## 2019-09-28 NOTE — Progress Notes (Signed)
[facility] to transport patient to [facility], pick up at [facility] at [facility] 09/29/19 to arrive at [facility] at 0900. Patient to be NPO after midnight. Patient aware of transport, will continue to monitor.

## 2019-09-28 NOTE — Progress Notes (Signed)
CRITICAL VALUE ALERT  Critical Value:  MRSA PCR positive  Date & Time Notied:  09/28/19, 1352  Provider Notified: Dr. Dyann Kief  Orders Received/Actions taken: MRSA positive PCR standing orders placed

## 2019-09-29 ENCOUNTER — Ambulatory Visit (HOSPITAL_COMMUNITY)
Admission: RE | Admit: 2019-09-29 | Discharge: 2019-09-29 | Disposition: A | Discharge status: Home / Self Care | Payer: Medicare Other | Source: Ambulatory Visit | Attending: Internal Medicine | Admitting: Internal Medicine

## 2019-09-29 HISTORY — PX: IR KYPHO THORACIC WITH BONE BIOPSY: IMG5518

## 2019-09-29 LAB — PROTIME-INR
INR: 1 (ref 0.8–1.2)
Prothrombin Time: 12.9 seconds (ref 11.4–15.2)

## 2019-09-29 MED ORDER — FENTANYL CITRATE (PF) 100 MCG/2ML IJ SOLN
INTRAMUSCULAR | Status: AC | PRN
  Administered 2019-09-29 (×2): 25 ug via INTRAVENOUS

## 2019-09-29 MED ORDER — MIDAZOLAM HCL 2 MG/2ML IJ SOLN
INTRAMUSCULAR | Status: AC | PRN
  Administered 2019-09-29: 0.5 mg via INTRAVENOUS
  Administered 2019-09-29: 1 mg via INTRAVENOUS

## 2019-09-29 MED ORDER — CEFAZOLIN SODIUM-DEXTROSE 2-4 GM/100ML-% IV SOLN
INTRAVENOUS | Status: AC
  Filled 2019-09-29: qty 100

## 2019-09-29 MED ORDER — FENTANYL CITRATE (PF) 100 MCG/2ML IJ SOLN
INTRAMUSCULAR | Status: AC
  Filled 2019-09-29: qty 2

## 2019-09-29 MED ORDER — MIDAZOLAM HCL 2 MG/2ML IJ SOLN
INTRAMUSCULAR | Status: AC
  Filled 2019-09-29: qty 2

## 2019-09-29 MED ORDER — MIDAZOLAM HCL 2 MG/2ML IJ SOLN
INTRAMUSCULAR | Status: AC | PRN
  Administered 2019-09-29: 0.5 mg via INTRAVENOUS

## 2019-09-29 MED ORDER — LIDOCAINE HCL 1 % IJ SOLN
INTRAMUSCULAR | Status: AC
  Filled 2019-09-29: qty 20

## 2019-09-29 MED ORDER — BUPIVACAINE HCL (PF) 0.5 % IJ SOLN
INTRAMUSCULAR | Status: AC
  Filled 2019-09-29: qty 30

## 2019-09-29 MED ORDER — CEFAZOLIN SODIUM-DEXTROSE 2-4 GM/100ML-% IV SOLN
2.0000 g | INTRAVENOUS | Status: AC
  Administered 2019-09-29: 2 g via INTRAVENOUS

## 2019-09-29 MED ORDER — CEFAZOLIN SODIUM-DEXTROSE 2-4 GM/100ML-% IV SOLN: 2.0000 g | Freq: Three times a day (TID) | INTRAVENOUS | Status: DC

## 2019-09-29 NOTE — Progress Notes (Signed)
Called patient's son at his request to let him know his father was being transported to [facility].

## 2019-09-29 NOTE — Sedation Documentation (Signed)
Pt arrived to Radiology nurses station via [facility] from [facility]. Report received. Pt is awake and alert, consent signed, NPO. Dr in to speak with pt prior to procedure

## 2019-09-29 NOTE — Plan of Care (Signed)
  Problem: Education: Goal: Knowledge of General Education information will improve Description: Including pain rating scale, medication(s)/side effects and non-pharmacologic comfort measures Outcome: Progressing   Problem: Health Behavior/Discharge Planning: Goal: Ability to manage health-related needs will improve Outcome: Progressing   Problem: Clinical Measurements: Goal: Will remain free from infection Outcome: Progressing   

## 2019-09-29 NOTE — TOC Progression Note (Signed)
Transition of Care [facility]) - Progression Note    Patient Details  Name: Curtis [name] MRN: [id number] Date of Birth: 1946-02-24  Transition of Care [facility]) CM/SW Contact  Ihor Gully, LCSW Phone Number: 09/29/2019, 11:53 AM  Clinical Narrative:    Lauro Regulus, states that patient is active with a RN through [facility], which they want to continue. Declined home PT.    Expected Discharge Plan: Home/Self Care Barriers to Discharge: Continued Medical Work up  Expected Discharge Plan and Services Expected Discharge Plan: Home/Self Care In-house Referral: Clinical Social Work     Living arrangements for the past 2 months: Single Family Home                                       Social Determinants of Health (SDOH) Interventions    Readmission Risk Interventions Readmission Risk Prevention Plan 09/26/2019 02/11/2019 01/04/2019  Transportation Screening Complete Complete Complete  PCP or Specialist Appt within 5-7 Days - - -  PCP or Specialist Appt within 3-5 Days - - Complete  Home Care Screening - - -  Medication Review (RN CM) - - -  [place] or [place] - - Complete  Social Work Consult for [place] Planning/Counseling - - Complete  Palliative Care Screening - - Not Applicable  Medication Review (RN Care Manager) Complete Complete Complete  PCP or Specialist appointment within 3-5 days of discharge Not Complete Not Complete -  PCP/Specialist Appt Not Complete comments will complete at d/c. Pt returning to SNF -  [place] or Home Care Consult Complete Not Complete -  [place] or Home Care Consult Pt Refusal Comments - Pt returning to SNF -  SW Recovery Care/Counseling Consult Not Complete Complete -  Palliative Care Screening Not Applicable Not Applicable -  [place] Not Complete Complete -  SNF Comments PT eval pending - -  Some recent data might be hidden

## 2019-09-29 NOTE — Progress Notes (Signed)
PROGRESS NOTE    Curtis [name]  [id number] DOB: 04-06-1946 DOA: 09/24/2019 PCP: [name], MD   Brief Narrative:  Per HPI: Curtis [name] a 74 y.o.malewith medical history significant ofhypertension, COPD on 4 L of oxygen for chronic respiratory failure, peripheral vascular disease, pulmonary asbestosis and coronary artery disease presented to ED for evaluation of lower back pain secondary to mechanical fall. Patient states that he was walking with his walker and suddenly fell backward on the floor after his walker stopped on the bathroom rug. Patient denies losing consciousness and reports pain in his head, neck, back and hips patient states that he lives alone by himself but his son is next-door and check on him on regular basis. Patient is complaining of lower back pain but denies numbness, weakness, urinary incontinence and stool incontinence. Patient is not on any anticoagulation. Patient also denies fever, chills, chest pain, shortness of breath, nausea, vomiting, abdominal pain urinary symptoms.  -Patient admitted with fall at home and is complaining of low back pain with noted age-indeterminate T11 and T12 vertebral body compression fractures.  MRI performed on 5/25 with what appears to be a new T12 fracture with 35% loss in vertebral height.  Discussed case with IR and patient.  Agree that the best approach would be for full consultation in a.m. and to keep n.p.o. after midnight.  Anticipate possible kyphoplasty as soon as 5/27 after which point, he should be able to return home with home health physical therapy.  Assessment & Plan:   Principal Problem:   Fall at home Active Problems:   COPD with chronic bronchitis ([place])   Hyperlipidemia LDL goal <70   Anemia, iron deficiency   Anxiety disorder   Ambulatory dysfunction   Fall   Pressure injury of skin   Recurrent falls with associated recent T12 compression fracture  -MRI performed 5/25 with findings of  recent changes and discussed with IR.  IR and patient both agree that performing procedure while inpatient would be best approach. -patient NPO and ready for kyphoplasty later today -TLSO brace ordered with PT evaluation recommending SNF, however patient declines rehab placement and would likely go home with home health PT after procedure. -Continue PRN analgesics   AKI-improving -Baseline creatinine 0.6-0.7 -Continue further time-limited IV fluid and follow labs -Nonoliguric -Avoid nephrotoxic agents -Continue to follow I's and O's -Creatinine peaked at 1.59 -follow BMET in am  COPD with chronic bronchitis and chronic hypoxemia -Wears 4 L nasal cannula at home -Continue home medications -No acute bronchospasms noted -denies SOB and no wheezing on exam  Iron deficiency anemia -Currently stable  Anxiety disorder -Continue home medications as tolerated  DVT prophylaxis: Heparin Code Status: Partial code (no intubation) Family Communication:  No family at bedside. Disposition Plan:  Plan for kyphoplasty while inpatient and then discharged to home with home PT.  Status is: Inpatient   Dispo: The patient is from: Home  Anticipated d/c is to: Home with home health services.  Anticipated d/c date is: 09/30/19  Patient currently is not medically stable to d/c.  Current plan is for patient to have kyphoplasty later today (09/29/19). Follow procedure results and need for PRN pain meds. Will discharge home on 5/28 if stable with [facility] services.   Consultants:   Consult to IR  Procedures:   None  Antimicrobials:   None   Subjective: NPO, in no distress and depending what movement or position he engaged into having back pain.  Objective: Vitals:   09/28/19  2207 09/29/19 0008 09/29/19 0205 09/29/19 0557  BP: (!) 175/83 131/70 129/70 (!) 154/75  Pulse: (!) 107 84 80 88  Resp: 20 20 20 20   Temp: (!) 100.7 F (38.2 C) 99 F  (37.2 C) 99 F (37.2 C) 98.7 F (37.1 C)  TempSrc: Oral Oral Oral Oral  SpO2: 96% 94% 95% 97%  Weight:      Height:        Intake/Output Summary (Last 24 hours) at 09/29/2019 1429 Last data filed at 09/29/2019 0600 Gross per 24 hour  Intake 240 ml  Output 900 ml  Net -660 ml   Filed Weights   09/24/19 1923  Weight: 81.6 kg    Examination: General exam: Alert, awake, oriented x 3; in no acute distress and expressing back pain with certain movements. No nausea or vomiting. Afebrile.  Respiratory system: Clear to auscultation. Respiratory effort normal. Cardiovascular system:RRR. No murmurs, rubs, gallops. Gastrointestinal system: Abdomen is nondistended, soft and nontender. No organomegaly or masses felt. Normal bowel sounds heard. Central nervous system: Alert and oriented. No focal neurological deficits. Extremities: No C/C/E, +pedal pulses Skin: No rashes, no petechiae. Multiple pressure injuries stage 1 and 2 affecting bilateral heels and sacrum; no signs of superimposed infection. Psychiatry: Mood & affect appropriate.    Data Reviewed: I have personally reviewed following labs and imaging studies  CBC: Recent Labs  Lab 09/24/19 2215 09/26/19 0546 09/27/19 0605 09/28/19 0546  WBC 12.1* 10.0 6.7 7.2  NEUTROABS 10.6*  --   --   --   HGB 10.7* 9.7* 9.7* 9.1*  HCT 36.3* 32.3* 32.7* 30.3*  MCV 88.1 86.4 88.9 87.3  PLT 349 328 316 [id number]   Basic Metabolic Panel: Recent Labs  Lab 09/24/19 2215 09/26/19 0546 09/27/19 0605 09/28/19 0546  NA 140 138 137 135  K 5.1 4.0 4.1 3.9  CL 96* 95* 93* 93*  CO2 32 34* 33* 33*  GLUCOSE 122* 89 88 92  BUN 22 32* 33* 27*  CREATININE 1.59* 1.46* 1.37* 1.36*  CALCIUM 10.4* 9.7 9.7 9.5   GFR: Estimated Creatinine Clearance: 51.5 mL/min (A) (by C-G formula based on SCr of 1.36 mg/dL (H)).   Liver Function Tests: Recent Labs  Lab 09/24/19 2215  AST 19  ALT 12  ALKPHOS 101  BILITOT 0.4  PROT 8.5*  ALBUMIN 3.2*    CBG: Recent Labs  Lab 09/25/19 0859  GLUCAP 157*     Recent Results (from the past 240 hour(s))  SARS Coronavirus 2 by RT PCR (hospital order, performed in [facility] hospital lab) Nasopharyngeal Nasopharyngeal Swab     Status: None   Collection Time: 09/25/19  1:28 AM   Specimen: Nasopharyngeal Swab  Result Value Ref Range Status   SARS Coronavirus 2 NEGATIVE NEGATIVE Final    Comment: (NOTE) SARS-CoV-2 target nucleic acids are NOT DETECTED. The SARS-CoV-2 RNA is generally detectable in upper and lower respiratory specimens during the acute phase of infection. The lowest concentration of SARS-CoV-2 viral copies this assay can detect is 250 copies / mL. A negative result does not preclude SARS-CoV-2 infection and should not be used as the sole basis for treatment or other patient management decisions.  A negative result may occur with improper specimen collection / handling, submission of specimen other than nasopharyngeal swab, presence of viral mutation(s) within the areas targeted by this assay, and inadequate number of viral copies (<250 copies / mL). A negative result must be combined with clinical observations, patient history, and epidemiological information.  Fact Sheet for Patients:   StrictlyIdeas.no Fact Sheet for Healthcare Providers: BankingDealers.co.za This test is not yet approved or cleared  by the [place] FDA and has been authorized for detection and/or diagnosis of SARS-CoV-2 by FDA under an Emergency Use Authorization (EUA).  This EUA will remain in effect (meaning this test can be used) for the duration of the COVID-19 declaration under Section 564(b)(1) of the Act, 21 U.S.C. section 360bbb-3(b)(1), unless the authorization is terminated or revoked sooner. Performed at [facility], [address] [place], [place] [address]   MRSA PCR Screening     Status: Abnormal   Collection Time: 09/28/19 11:05  AM   Specimen: Nasopharyngeal  Result Value Ref Range Status   MRSA by PCR POSITIVE (A) NEGATIVE Final    Comment:        The GeneXpert MRSA Assay (FDA approved for NASAL specimens only), is one component of a comprehensive MRSA colonization surveillance program. It is not intended to diagnose MRSA infection nor to guide or monitor treatment for MRSA infections. BENGTSON P. AT 1350 ON [id number] BY THOMPSON S. Performed at [facility], [address] [place], [place] [address]      Radiology Studies: No results found.  Scheduled Meds: . amitriptyline  100 mg Oral QHS  . amLODipine  5 mg Oral Daily  . aspirin EC  81 mg Oral Daily  . Chlorhexidine Gluconate Cloth  6 each Topical Q0600  . finasteride  5 mg Oral BH-q7a  . gabapentin  600 mg Oral TID  . latanoprost  1 drop Both Eyes QHS  . metoprolol tartrate  12.5 mg Oral BID  . mupirocin ointment  1 application Nasal BID  . tamsulosin  0.4 mg Oral QHS   Continuous Infusions:   LOS: 4 days    Time spent: 25 minutes    Barton Dubois, MD [facility]  If 7PM-7AM, please contact night-coverage [email or web address] 09/29/2019, 2:29 PM

## 2019-09-29 NOTE — Progress Notes (Signed)
PT Cancellation Note  Patient Details Name: Curtis [name] MRN: [id number] DOB: Jul 07, 1945   Cancelled Treatment:    Reason Eval/Treat Not Completed: Patient at procedure or test/unavailable. Patient to [facility] for testing.   Floria Raveling. Hartnett-Rands, MS, PT Per [place] [id number] 09/29/2019, 2:58 PM

## 2019-09-29 NOTE — Consult Note (Signed)
Chief Complaint: Patient was seen in consultation today for T12 kyphoplasty/vertebroplasty  Referring Physician(s): Shah,Pratik D  Supervising Physician: Pedro Earls  Patient Status: [facility] inpt - transferred to [facility] for procedure  History of Present Illness: Curtis [name] is a 74 y.o. male with a past medical history significant for depression, arthritis, glaucoma, GERD, IBS, diverticulitis, asbesotsis, CAD, HTN, HLD, AAA, NSTEMI and PVD who presented to [facility] ED on 09/24/19 after falling backwards while walking with his walker and noted severe head, neck, back and hip pain. CT head and c-spine showed no acute abnormalities, however x-ray of the thoracic spine showed age-indeterminate compression fractures of the T11 and T12 vertebral bodies. Follow up MRI lumbar spine showed recent/unhealed T12 compression fracture with 35% height loss. NIR was consulted for possible T12 kyphoplasty/vertebroplasty - patient was reviewed by Dr. Karenann Cai who approves patient for procedure. He has been transferred from [facility] to [facility] for the procedure.  Curtis [name] seen in IR holding area, he states that he has had low back pain since he was in the [employer] and his pain currently feels about the same. When he tries to sit on the edge of the bed or stand up the pain becomes worse. He tells me that he broke his hip last year and "basically hasn't walked much since." He lives alone at home but his son lives across the street and checks on him frequently. From our conversation it appears he  is not able to do most of his ADLs independently however he provides vague answers to these questions. He has BPH which causes trouble urinating, he has not noticed any difficulty controlling his bowel or bladder. He denies any numbness or tingling in his legs or feet, he is asking that his heels be raised up for comfort. He tells me many stories about the [employer] family as he used to work for them at [employer] and his  wife used to be a Designer, multimedia here at [facility]. He states understanding of the requested procedure and wishes to proceed as planned.  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 s/p cath in 2015 showing occluded RCA and high-grade LCx stenosis treated with DESx2 to LCx   Cancer [facility])    Depression    Diverticulitis    hospital 2011 [facility]   Diverticulosis    GERD (gastroesophageal reflux disease)    Glaucoma 2016   bilateral   Hernia of unspecified site of abdominal cavity without mention of obstruction or gangrene    hiatal   Hyperlipidemia LDL goal <70 04/20/2014   Hypertension    IBS (irritable bowel syndrome)    Myocardial infarction ([place]) 01/2014   NSTEMI   Peripheral vascular disease (HCC)    ankle brachial index of 0.79 on the right and 0.61 on the left.    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    SOB (shortness of breath)     Past Surgical History:  Procedure Laterality Date   CARDIAC CATHETERIZATION     CATARACT EXTRACTION     CORONARY ANGIOPLASTY     HERNIA REPAIR     INTRAMEDULLARY (IM) NAIL INTERTROCHANTERIC Left 04/14/2019   Procedure: INTRAMEDULLARY (IM) NAIL INTERTROCHANTRIC;  Surgeon: Rod Can, MD;  Location: [facility];  Service: Orthopedics;  Laterality: Left;   LEFT HEART CATHETERIZATION WITH CORONARY ANGIOGRAM N/A 01/17/2014   Procedure: LEFT HEART CATHETERIZATION WITH CORONARY ANGIOGRAM;  Surgeon: Leonie Man, MD;  Location: [facility] CATH LAB;  Service: Cardiovascular;  Laterality: N/A;   left shoulder      x 3   NISSEN FUNDOPLICATION     right elbow surgery     x 2   right knee arthroscopy     squamous cell skin cancer     Left Hand   SUPRAVENTRICULAR TACHYCARDIA ABLATION N/A 01/16/2014   Procedure: SUPRAVENTRICULAR  TACHYCARDIA ABLATION;  Surgeon: Evans Lance, MD;  Location: [facility] CATH LAB;  Service: Cardiovascular;  Laterality: N/A;    Allergies: Codeine and Trileptal [oxcarbazepine]  Medications: Prior to Admission medications   Medication Sig Start Date End Date Taking? Authorizing Provider  albuterol (VENTOLIN HFA) 108 (90 Base) MCG/ACT inhaler 2 puffs every 4 - 6 hours as needed. This is your rescue inhaler Patient not taking: Reported on 09/25/2019 05/05/19   Margaretha Seeds, MD  ALPRAZolam [name]) 1 MG tablet Take 1 tablet (1 mg total) by mouth 2 (two) times daily as needed for anxiety or sleep. Patient taking differently: Take 2 mg by mouth 2 (two) times daily as needed for anxiety or sleep.  04/19/19   Mikhail, Velta Addison, DO  amitriptyline (ELAVIL) 100 MG tablet Take 1 tablet (100 mg total) by mouth at bedtime. 03/29/19   Gerlene Fee, NP  amLODipine (NORVASC) 2.5 MG tablet Take 2.5 mg by mouth daily.    [provider]  aspirin EC 81 MG tablet Take 81 mg by mouth daily.    [provider]  budesonide-formoterol (SYMBICORT) 160-4.5 MCG/ACT inhaler Inhale 2 puffs into the lungs 2 (two) times daily. Patient not taking: Reported on 05/14/2019 03/29/19   Gerlene Fee, NP  collagenase (SANTYL) ointment Apply 1 application topically daily. Apply to left buttock wound and bilateral heel ulcerations. Patient not taking: Reported on 09/25/2019 03/29/19   Gerlene Fee, NP  enoxaparin (LOVENOX) 40 MG/0.4ML injection Inject 0.4 mLs (40 mg total) into the skin daily. Patient not taking: Reported on 05/14/2019 04/16/19 05/16/19  Rod Can, MD  finasteride (PROSCAR) 5 MG tablet Take 1 tablet (5 mg total) by mouth every morning. Patient taking differently: Take 5 mg by mouth daily.  03/29/19   Gerlene Fee, NP  Fluticasone-Umeclidin-Vilant (TRELEGY ELLIPTA) 200-62.5-25 MCG/INH AEPB Inhale 1 puff into the lungs daily. Patient not taking: Reported on 09/25/2019 05/05/19   Margaretha Seeds, MD  gabapentin (NEURONTIN) 800 MG tablet Take 800 mg by mouth 3 (three) times daily. 08/24/19   [provider]  latanoprost (XALATAN) 0.005 % ophthalmic solution Place 1 drop into both eyes at bedtime. 03/29/19   Gerlene Fee, NP  lidocaine (LIDODERM) 5 % Place 1 patch onto the skin daily. Remove & Discard patch within 12 hours or as directed by MD 09/24/19   Triplett, Tammy, PA-C  metoprolol tartrate (LOPRESSOR) 25 MG tablet Take 0.5 tablets (12.5 mg total) by mouth 2 (two) times daily. 05/20/19 09/25/19  Elodia Florence., MD  Multiple Vitamins-Minerals (MULTIVITAMIN WITH MINERALS) tablet Take 1 tablet by mouth daily. Patient not taking: Reported on 05/14/2019 04/19/19   Cristal Ford, DO  nitroGLYCERIN (NITROSTAT) 0.4 MG SL tablet Place 1 tablet (0.4 mg total) under the tongue every 5 (five) minutes x 3 doses as needed for chest pain. Patient not taking: Reported on 09/25/2019 03/29/19   Nyoka Cowden,  Phylis Bougie, NP  OXYGEN Place 4 L/min into the nose continuous.     [provider]  tamsulosin (FLOMAX) 0.4 MG CAPS capsule Take 1 capsule (0.4 mg total) by mouth at bedtime. 03/29/19   Gerlene Fee, NP  lamoTRIgine (LAMICTAL) 100 MG tablet Take 1 tablet (100 mg total) by mouth 2 (two) times daily. 01/23/16 01/29/16  Kathrynn Ducking, MD     Family History  Problem Relation Age of Onset   Colon cancer Mother    Breast cancer Sister 41   Colon cancer Maternal Aunt    Colon cancer Maternal Uncle    Heart attack Neg Hx    Stroke Neg Hx     Social History   Socioeconomic History   Marital status: Widowed    Spouse name: Not on file   Number of children: 2   Years of education: 71   Highest education level: Not on file  Occupational History   Occupation: Veteran   Occupation: retired   Tobacco Use   Smoking status: Former Smoker    Packs/day: 2.50    Years: 50.00    Pack years: 125.00    Types: Cigarettes    Quit date: 05/05/2005    Years  since quitting: 14.4   Smokeless tobacco: Former Systems developer    Types: [place] date: 05/03/2012  Substance and Sexual Activity   Alcohol use: No    Alcohol/week: 0.0 standard drinks    Comment: last use 2013   Drug use: No   Sexual activity: Not Currently    Comment: widowed, Veteran  Other Topics Concern   Not on file  Social History Narrative   Long term resident of SNF    Social Determinants of Health   Financial Resource Strain: Low Risk    Difficulty of Paying Living Expenses: Not hard at all  Food Insecurity: No Food Insecurity   Worried About Charity fundraiser in the Last Year: Never true   Arboriculturist in the Last Year: Never true  Transportation Needs: No Transportation Needs   Lack of Transportation (Medical): No   Lack of Transportation (Non-Medical): No  Physical Activity: Inactive   Days of Exercise per Week: 0 days   Minutes of Exercise per Session: 0 min  Stress: No Stress Concern Present   Feeling of Stress : Not at all  Social Connections: Moderately Isolated   Frequency of Communication with Friends and Family: Once a week   Frequency of Social Gatherings with Friends and Family: Never   Attends Religious Services: Never   Marine scientist or Organizations: No   Attends Music therapist: Not asked   Marital Status: Married     Review of Systems: A 12 point ROS discussed and pertinent positives are indicated in the HPI above.  All other systems are negative.  Review of Systems  Constitutional: Positive for fatigue. Negative for chills and fever.  Respiratory: Positive for shortness of breath (baseline). Negative for cough.   Cardiovascular: Negative for chest pain.  Gastrointestinal: Negative for abdominal pain, blood in stool, constipation, diarrhea, nausea and vomiting.  Genitourinary: Positive for difficulty urinating (baseline). Negative for hematuria.  Musculoskeletal: Positive for arthralgias and back  pain.  Neurological: Negative for dizziness, weakness, light-headedness, numbness and headaches.    Vital Signs: There were no vitals taken for this visit.  Physical Exam Vitals and nursing note reviewed.  Constitutional:      General: He is  not in acute distress.    Appearance: He is ill-appearing.  HENT:     Head: Normocephalic.     Mouth/Throat:     Mouth: Mucous membranes are moist.     Pharynx: Oropharynx is clear. No oropharyngeal exudate or posterior oropharyngeal erythema.     Comments: Edentulous Cardiovascular:     Rate and Rhythm: Normal rate and regular rhythm.  Pulmonary:     Effort: Pulmonary effort is normal.     Comments: Decreased breath sounds bilaterally 4L supplemental O2 via [place] Abdominal:     General: There is no distension.     Palpations: Abdomen is soft.     Tenderness: There is no abdominal tenderness.  Musculoskeletal:     Right lower leg: No edema.     Comments: Unable to sit up in bed independently due to pain  Skin:    General: Skin is warm and dry.  Neurological:     Mental Status: He is alert and oriented to person, place, and time.  Psychiatric:        Mood and Affect: Mood normal.        Behavior: Behavior normal.        Thought Content: Thought content normal.        Judgment: Judgment normal.      MD Evaluation Airway: WNL((+) edentulous) Heart: WNL Abdomen: WNL Chest/ Lungs: WNL ASA  Classification: 3 Mallampati/Airway Score: Two   Imaging: DG Chest 2 View  Result Date: 09/24/2019 CLINICAL DATA:  Pain status post fall EXAM: CHEST - 2 VIEW COMPARISON:  May 14, 2019 FINDINGS: Again noted are persistent diffuse patchy bilateral airspace opacification. December prior study and is favored be secondary to a combination atelectasis, scarring, and pleural based plaques. There is cardiomegaly. There are atherosclerotic changes of the thoracic aorta. There is no pneumothorax. IMPRESSION: No acute cardiopulmonary process. Stable  cardiomegaly and bilateral calcified pleural based plaques as seen on the patient's prior CTs. Electronically Signed   By: Constance Holster M.D.   On: 09/24/2019 21:16   DG Thoracic Spine 2 View  Result Date: 09/24/2019 CLINICAL DATA:  Pain status post fall EXAM: LUMBAR SPINE - COMPLETE 4+ VIEW; THORACIC SPINE 2 VIEWS COMPARISON:  None. FINDINGS: For the purpose of this report, there are rudimentary ribs at the T12 level. There is probable there is an age-indeterminate compression fracture of the T12 vertebral body resulting in approximately 25-30% height loss. This fracture is new since 02/11/2019. There appears to be some mild height loss of the T11 vertebral body which is new since CT dated 02/11/2019. Evaluation of the mid upper thoracic spine is limited by osteopenia and overlapping osseous structures. Advanced vascular calcifications are noted throughout the abdominal aorta with an infrarenal abdominal aortic aneurysm measuring up to approximately 3.4 cm. There is diffuse osteopenia throughout the visualized portions of the thoracolumbar spine with multilevel degenerative changes and facet arthrosis, especially at the lower lumbar segments. IMPRESSION: 1. Age-indeterminate compression fractures of the T11 and T12 vertebral bodies, new since 2020. 2. The midthoracic spine is suboptimally evaluated secondary to osteopenia and overlapping structures. 3. Infrarenal abdominal aortic aneurysm measuring up to approximately 3.4 cm. Recommend followup by ultrasound in 3 years. This recommendation follows ACR consensus guidelines: White Paper of the ACR Incidental Findings Committee II on Vascular Findings. J Am Coll Radiol 2013; 10:789-794 Aortic Atherosclerosis (ICD10-I70.0). Electronically Signed   By: Constance Holster M.D.   On: 09/24/2019 21:15   DG Lumbar Spine Complete  Result Date:  09/24/2019 CLINICAL DATA:  Pain status post fall EXAM: LUMBAR SPINE - COMPLETE 4+ VIEW; THORACIC SPINE 2 VIEWS  COMPARISON:  None. FINDINGS: For the purpose of this report, there are rudimentary ribs at the T12 level. There is probable there is an age-indeterminate compression fracture of the T12 vertebral body resulting in approximately 25-30% height loss. This fracture is new since 02/11/2019. There appears to be some mild height loss of the T11 vertebral body which is new since CT dated 02/11/2019. Evaluation of the mid upper thoracic spine is limited by osteopenia and overlapping osseous structures. Advanced vascular calcifications are noted throughout the abdominal aorta with an infrarenal abdominal aortic aneurysm measuring up to approximately 3.4 cm. There is diffuse osteopenia throughout the visualized portions of the thoracolumbar spine with multilevel degenerative changes and facet arthrosis, especially at the lower lumbar segments. IMPRESSION: 1. Age-indeterminate compression fractures of the T11 and T12 vertebral bodies, new since 2020. 2. The midthoracic spine is suboptimally evaluated secondary to osteopenia and overlapping structures. 3. Infrarenal abdominal aortic aneurysm measuring up to approximately 3.4 cm. Recommend followup by ultrasound in 3 years. This recommendation follows ACR consensus guidelines: White Paper of the ACR Incidental Findings Committee II on Vascular Findings. J Am Coll Radiol 2013; 10:789-794 Aortic Atherosclerosis (ICD10-I70.0). Electronically Signed   By: Constance Holster M.D.   On: 09/24/2019 21:15   CT Head Wo Contrast  Result Date: 09/24/2019 CLINICAL DATA:  Status post fall with trauma to the head. EXAM: CT HEAD WITHOUT CONTRAST CT CERVICAL SPINE WITHOUT CONTRAST TECHNIQUE: Multidetector CT imaging of the head and cervical spine was performed following the standard protocol without intravenous contrast. Multiplanar CT image reconstructions of the cervical spine were also generated. COMPARISON:  August 20, 2017 FINDINGS: CT HEAD FINDINGS Brain: No evidence of acute  infarction, hemorrhage, hydrocephalus, extra-axial collection or mass lesion/mass effect. Advanced brain parenchymal volume loss and deep white matter microangiopathy Vascular: Intracranial calcific atherosclerotic disease. Skull: Normal. Negative for fracture or focal lesion. Sinuses/Orbits: No acute finding. Other: None. CT CERVICAL SPINE FINDINGS Alignment: Normal. Skull base and vertebrae: No acute fracture. No primary bone lesion or focal pathologic process. Soft tissues and spinal canal: No prevertebral fluid or swelling. No visible canal hematoma. Disc levels: Multilevel osteoarthritic changes of the cervical spine. Upper chest: Right supraclavicular and superior mediastinal lymphadenopathy. Other: Linear and nodular pleural thickening and calcifications in the bilateral lung apices. Interstitial thickening. IMPRESSION: 1. No acute intracranial abnormality. 2. Advanced brain parenchymal atrophy and chronic microvascular disease. 3. No evidence of acute traumatic injury to cervical spine. 4. Multilevel osteoarthritic changes of the cervical spine. 5. Bulky right supraclavicular and superior mediastinal lymphadenopathy. Please correlate clinically. 6. Linear and nodular pleural thickening and calcifications in the bilateral lung apices. Interstitial thickening. Electronically Signed   By: Fidela Salisbury M.D.   On: 09/24/2019 20:50   CT Cervical Spine Wo Contrast  Result Date: 09/24/2019 CLINICAL DATA:  Status post fall with trauma to the head. EXAM: CT HEAD WITHOUT CONTRAST CT CERVICAL SPINE WITHOUT CONTRAST TECHNIQUE: Multidetector CT imaging of the head and cervical spine was performed following the standard protocol without intravenous contrast. Multiplanar CT image reconstructions of the cervical spine were also generated. COMPARISON:  August 20, 2017 FINDINGS: CT HEAD FINDINGS Brain: No evidence of acute infarction, hemorrhage, hydrocephalus, extra-axial collection or mass lesion/mass effect.  Advanced brain parenchymal volume loss and deep white matter microangiopathy Vascular: Intracranial calcific atherosclerotic disease. Skull: Normal. Negative for fracture or focal lesion. Sinuses/Orbits: No acute finding. Other:  None. CT CERVICAL SPINE FINDINGS Alignment: Normal. Skull base and vertebrae: No acute fracture. No primary bone lesion or focal pathologic process. Soft tissues and spinal canal: No prevertebral fluid or swelling. No visible canal hematoma. Disc levels: Multilevel osteoarthritic changes of the cervical spine. Upper chest: Right supraclavicular and superior mediastinal lymphadenopathy. Other: Linear and nodular pleural thickening and calcifications in the bilateral lung apices. Interstitial thickening. IMPRESSION: 1. No acute intracranial abnormality. 2. Advanced brain parenchymal atrophy and chronic microvascular disease. 3. No evidence of acute traumatic injury to cervical spine. 4. Multilevel osteoarthritic changes of the cervical spine. 5. Bulky right supraclavicular and superior mediastinal lymphadenopathy. Please correlate clinically. 6. Linear and nodular pleural thickening and calcifications in the bilateral lung apices. Interstitial thickening. Electronically Signed   By: Fidela Salisbury M.D.   On: 09/24/2019 20:50   MR LUMBAR SPINE WO CONTRAST  Result Date: 09/27/2019 CLINICAL DATA:  Low back pain after a recent fall. EXAM: MRI LUMBAR SPINE WITHOUT CONTRAST TECHNIQUE: Multiplanar, multisequence MR imaging of the lumbar spine was performed. No intravenous contrast was administered. COMPARISON:  Lumbar spine radiographs 09/24/2019. CT abdomen and pelvis 02/08/2019. FINDINGS: The study is mildly motion degraded. Segmentation: Standard. Alignment: Mildly exaggerated lumbar lordosis. Trace anterolisthesis of L4 on L5. Vertebrae: There is a T12 compression fracture with 35% height loss, a visible fracture line including a fluid-filled fracture cleft, mild marrow edema, and 2 mm  retropulsion of the posterior vertebral body. The T11 vertebral body and lumbar vertebral bodies are preserved in height. No suspicious osseous lesion is identified. A hemangioma is noted in the L1 vertebral body. Small Schmorl's nodes involve the T10 inferior and L1 superior endplates. Conus medullaris and cauda equina: Conus extends to the lower L1 level. Conus and cauda equina appear normal. Paraspinal and other soft tissues: Mild lower thoracic paravertebral soft tissue edema. 2 cm T2 hyperintense focus in the right kidney, likely a cyst. 3 cm abdominal aortic aneurysm, similar to the prior CT. Disc levels: T11-12: Minimal T12 retropulsion without stenosis. T12-L1: Negative. L1-2: Small left foraminal disc protrusion without stenosis. L2-3: Mild disc bulging and mild facet hypertrophy without stenosis. L3-4: Moderate disc space narrowing. Right eccentric disc bulging, endplate spurring, and mild facet hypertrophy result in borderline to mild right lateral recess stenosis and mild-to-moderate right neural foraminal stenosis without spinal stenosis. L4-5: Trace anterolisthesis with minimal bulging of uncovered disc, congenitally short pedicles, and severe facet and ligamentum flavum hypertrophy result in moderate spinal stenosis, moderate bilateral lateral recess stenosis, and mild bilateral neural foraminal stenosis. The L5 nerve roots may be affected in the lateral recesses. L5-S1: Severe facet arthrosis without stenosis. IMPRESSION: 1. Recent/unhealed T12 compression fracture with 35% height loss. 2. Multilevel lumbar disc and facet degeneration, most notable at L4-5 where there is moderate spinal stenosis and lateral recess stenosis. 3. Known 3 cm abdominal aortic aneurysm. Electronically Signed   By: Logan Bores M.D.   On: 09/27/2019 13:50   DG Hips Bilat W or Wo Pelvis 3-4 Views  Result Date: 09/24/2019 CLINICAL DATA:  Pain status post fall EXAM: DG HIP (WITH OR WITHOUT PELVIS) 3-4V BILAT COMPARISON:   December 9th 2020. FINDINGS: The patient has undergone prior intramedullary nail placement through the left femur. The hardware is intact. There is no evidence for an acute displaced fracture or dislocation. Atherosclerotic changes are noted. IMPRESSION: No acute displaced fracture or dislocation. Electronically Signed   By: Constance Holster M.D.   On: 09/24/2019 21:17    Labs:  CBC: Recent  Labs    09/24/19 2215 09/26/19 0546 09/27/19 0605 09/28/19 0546  WBC 12.1* 10.0 6.7 7.2  HGB 10.7* 9.7* 9.7* 9.1*  HCT 36.3* 32.3* 32.7* 30.3*  PLT 349 328 316 269    COAGS: Recent Labs    02/09/19 0930 02/15/19 0600 04/13/19 0027 09/29/19 0606  INR 2.4* 1.6* 1.3* 1.0  APTT 36  --   --   --     BMP: Recent Labs    09/24/19 2215 09/26/19 0546 09/27/19 0605 09/28/19 0546  NA 140 138 137 135  K 5.1 4.0 4.1 3.9  CL 96* 95* 93* 93*  CO2 32 34* 33* 33*  GLUCOSE 122* 89 88 92  BUN 22 32* 33* 27*  CALCIUM 10.4* 9.7 9.7 9.5  CREATININE 1.59* 1.46* 1.37* 1.36*  GFRNONAA 42* 46* 50* 51*  GFRAA 48* 54* 58* 59*    LIVER FUNCTION TESTS: Recent Labs    05/18/19 0452 05/19/19 0342 05/20/19 0259 09/24/19 2215  BILITOT 0.7 0.7 0.8 0.4  AST 16 13* 13* 19  ALT 13 13 11 12   ALKPHOS 59 59 58 101  PROT 6.4* 6.1* 5.8* 8.5*  ALBUMIN 2.7* 2.6* 2.5* 3.2*    TUMOR MARKERS: No results for input(s): AFPTM, CEA, CA199, CHROMGRNA in the last 8760 hours.  Assessment and Plan:  74 y/o M with recent mechanical fall resulting in symptomatic T12 compression fracture who [facility] from [facility] today for T12 kyphoplasty/vertebroplasty with Dr. Karenann Cai.   Patient has been NPO since midnight, no current anticoagulation currently. Afebrile, WBC 7.2, hgb 9.1, plt 269, INR 1.0.  Risks and benefits of T12 kyphoplasty/vertebroplasty were discussed with the patient including, but not limited to education regarding the natural healing process of compression fractures without intervention,  bleeding, infection, cement migration which may cause spinal cord damage, paralysis, pulmonary embolism or even death.  This interventional procedure involves the use of X-rays and because of the nature of the planned procedure, it is possible that we will have prolonged use of X-ray fluoroscopy.  Potential radiation risks to you include (but are not limited to) the following: - A slightly elevated risk for cancer  several years later in life. This risk is typically less than 0.5% percent. This risk is low in comparison to the normal incidence of human cancer, which is 33% for women and 50% for men according to the [place]. - Radiation induced injury can include skin redness, resembling a rash, tissue breakdown / ulcers and hair loss (which can be temporary or permanent).   The likelihood of either of these occurring depends on the difficulty of the procedure and whether you are sensitive to radiation due to previous procedures, disease, or genetic conditions.   IF your procedure requires a prolonged use of radiation, you will be notified and given written instructions for further action.  It is your responsibility to monitor the irradiated area for the 2 weeks following the procedure and to notify your physician if you are concerned that you have suffered a radiation induced injury.    All of the patient's questions were answered, patient is agreeable to proceed.  Consent signed and in chart.  Thank you for this interesting consult.  I greatly enjoyed meeting Curtis [name] and look forward to participating in their care.  A copy of this report was sent to the requesting provider on this date.  Electronically Signed: Joaquim Nam, PA-C 09/29/2019, 8:50 AM   I spent a total of  40 Minutes  in face to face in clinical consultation, greater than 50% of which was counseling/coordinating care for  T12 kyphoplasty/vertebroplasty.

## 2019-09-29 NOTE — TOC Progression Note (Signed)
Transition of Care [facility]) - Progression Note   Patient Details  Name: OBED METZEN MRN: [id number] Date of Birth: December 16, 1945  Transition of Care [facility]) CM/SW [place], LCSW Phone Number: 09/29/2019, 5:29 PM  Clinical Narrative: CSW received call from [place] with [facility]. Per Judson Roch, patient is actually active with [facility] and not [facility]. Sarah requested that resumption orders for [facility] be added prior to patient's discharge.  Expected Discharge Plan: Home/Self Care Barriers to Discharge: Continued Medical Work up  Expected Discharge Plan and Services Expected Discharge Plan: Home/Self Care In-house Referral: Clinical Social Work Living arrangements for the past 2 months: Single Family Home  Readmission Risk Interventions Readmission Risk Prevention Plan 09/26/2019 02/11/2019 01/04/2019  Transportation Screening Complete Complete Complete  PCP or Specialist Appt within 5-7 Days - - -  PCP or Specialist Appt within 3-5 Days - - Complete  Home Care Screening - - -  Medication Review (RN CM) - - -  [place] or [place] - - Complete  Social Work Consult for [place] Planning/Counseling - - Complete  Palliative Care Screening - - Not Applicable  Medication Review (RN Care Manager) Complete Complete Complete  PCP or Specialist appointment within 3-5 days of discharge Not Complete Not Complete -  PCP/Specialist Appt Not Complete comments will complete at d/c. Pt returning to SNF -  [place] or Home Care Consult Complete Not Complete -  [place] or Home Care Consult Pt Refusal Comments - Pt returning to SNF -  [place] Recovery Care/Counseling Consult Not Complete Complete -  Palliative Care Screening Not Applicable Not Applicable -  [place] Not Complete Complete -  SNF Comments PT eval pending - -  Some recent data might be hidden

## 2019-09-30 DIAGNOSIS — S22000A Wedge compression fracture of unspecified thoracic vertebra, initial encounter for closed fracture: Secondary | ICD-10-CM

## 2019-09-30 LAB — BASIC METABOLIC PANEL
Anion gap: 10 (ref 5–15)
BUN: 28 mg/dL — ABNORMAL HIGH (ref 8–23)
CO2: 31 mmol/L (ref 22–32)
Calcium: 9.5 mg/dL (ref 8.9–10.3)
Chloride: 94 mmol/L — ABNORMAL LOW (ref 98–111)
Creatinine, Ser: 1.41 mg/dL — ABNORMAL HIGH (ref 0.61–1.24)
GFR calc Af Amer: 56 mL/min — ABNORMAL LOW (ref >60)
GFR calc non Af Amer: 48 mL/min — ABNORMAL LOW (ref >60)
Glucose, Bld: 98 mg/dL (ref 70–99)
Potassium: 4.1 mmol/L (ref 3.5–5.1)
Sodium: 135 mmol/L (ref 135–145)

## 2019-09-30 MED ORDER — HYDROCODONE-ACETAMINOPHEN 5-325 MG PO TABS: 1.0000 | ORAL_TABLET | Freq: Four times a day (QID) | ORAL | 0 refills | Status: AC | PRN

## 2019-09-30 MED ORDER — GABAPENTIN 600 MG PO TABS: 600.0000 mg | ORAL_TABLET | Freq: Three times a day (TID) | ORAL | 1 refills | Status: AC

## 2019-09-30 NOTE — Discharge Summary (Signed)
Physician Discharge Summary  Curtis [name] [id number] DOB: 01/28/1946 DOA: 09/24/2019  PCP: [name], MD  Admit date: 09/24/2019 Discharge date: 09/30/2019  Time spent: 35 minutes  Recommendations for Outpatient Follow-up:  1. Repeat basic metabolic panel electrolytes renal function   Discharge Diagnoses:  Principal Problem:   Fall at home Active Problems:   COPD with chronic bronchitis ([place])   Hyperlipidemia LDL goal <70   Anemia, iron deficiency   Anxiety disorder   Ambulatory dysfunction   Fall   Pressure injury of skin   Compression fracture of body of thoracic vertebra [facility])   Discharge Condition: Stable and improved.  Discharged home with instruction to follow-up with PCP, IR and orthopedic service.  CODE STATUS: Partial code (patient in agreement for every intervention except intubation).  Diet recommendation: Heart healthy diet.  Filed Weights   09/24/19 1923  Weight: 81.6 kg    History of present illness:  As per H&P written by Dr. Gwenette Greet on 09/25/19  74 y.o. 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  Patient states that he was walking with his walker and suddenly fell backward on the floor after his walker stopped on the bathroom rug.  Patient denies losing consciousness and reports pain in his head, neck, back and hips patient states that he lives alone by himself but his son is next-door and check on him on regular basis.  Patient is complaining of lower back pain but denies numbness, weakness, urinary incontinence and stool incontinence.  Patient is not on any anticoagulation.  Patient also denies fever, chills, chest pain, shortness of breath, nausea, vomiting, abdominal pain urinary symptoms.  ED Course: On arrival to the  ED patient had temperature of 98.8, blood pressure 117/61, heart rate 94, respiratory rate 20 and oxygen saturation 94% on room air.  Blood work showed WBC 12.1, hemoglobin 10.7, sodium 133, potassium 4.6, BUN 31, creatinine 0.6, blood glucose 150 and albumin 2.5.  CT head was negative for acute intracranial bleed or pathology.  CT scan of cervical spine was also negative.  Chest x-ray negative for acute pulmonary problem x-ray lumbar region was positive for compression fracture of T11 and T12 vertebral bodies but age not determined.  Albuterol inhalation and me Thiel prednisone in the ED.  Patient was able to stand for a brief period of time but was unable to make any steps.  TLSO brace was also ordered in the ED and patient admitted for observation.  Hospital Course:  Recurrent falls with associated recent T12 compression fracture  -MRI performed 5/25 with findings of recent changes and discussed with IR.   -Status post kyphoplasty done 09/29/2019 -TLSO brace ordered for the patient daily weight understanding. -Outpatient follow-up with interventional radiology/orthopedic service. -Continue as needed analgesic regimen.  -Home health services for physical therapy and home health RN requested at discharge.  AKI-improving -Baseline creatinine 0.6-0.7 -Nonoliguric -Avoid nephrotoxic agents -Creatinine peaked at 1.59 -follow BMET -Advised to maintain adequate hydration -At discharge creatinine 1.3-1.4 range.  COPD with chronic bronchitis and chronic hypoxemia -Wears 4 L nasal cannula at home -Continue home bronchodilator medications and regimen. -No acute bronchospasms noted during examination. -denies SOB and no wheezing on exam  Iron deficiency anemia -Currently stable -Continue outpatient follow-up follow hemoglobin trend -Continue iron supplementation.  Anxiety disorder -Continue  home medications as as needed -Mood overall stable.  Chronic pain/neuropathy -Continue adjusted  dose of Neurontin.  Procedures: Status post kyphoplasty procedure 09/29/2019  Consultations:  IR  Discharge Exam: Vitals:   09/30/19 0538 09/30/19 0906  BP: 116/61   Pulse: 79   Resp: 17   Temp: 98.7 F (37.1 C)   SpO2: 97% 91%    General: Afebrile, no chest pain, no nausea, no vomiting.  Reports still having some pain in his back, but somewhat improved after kyphoplasty procedure.  Breathing at baseline; 4-5 L nasal transplantation in place. Cardiovascular: S1 and S2, no rubs, no gallops, no JVD. Respiratory: Good air movement bilaterally.  Positive scattered rhonchi.  No wheezing or crackles. Abdomen: Soft, nontender, positive bowel sounds Extremities: No cyanosis or clubbing; no edema appreciated on exam.  Discharge Instructions   Discharge Instructions    Diet - low sodium heart healthy   Complete by: As directed    Discharge instructions   Complete by: As directed    Take medications as prescribed Follow-up with interventional radiologist after kyphoplasty procedure for further evaluation and management Follow-up with PCP in 2 weeks Maintain adequate hydration Follow heart healthy diet.     Allergies as of 09/30/2019      Reactions   Codeine Itching   Trileptal [oxcarbazepine] Other (See Comments)   Abnormal sodium levels Hyponatremia      Medication List    STOP taking these medications   budesonide-formoterol 160-4.5 MCG/ACT inhaler Commonly known as: SYMBICORT   enoxaparin 40 MG/0.4ML injection Commonly known as: LOVENOX   multivitamin with minerals tablet     TAKE these medications   albuterol 108 (90 Base) MCG/ACT inhaler Commonly known as: VENTOLIN HFA 2 puffs every 4 - 6 hours as needed. This is your rescue inhaler   ALPRAZolam 1 MG tablet Commonly known as: XANAX Take 1 tablet (1 mg total) by mouth 2 (two) times daily as needed for anxiety or sleep. What changed: how much to take   amitriptyline 100 MG tablet Commonly known as:  ELAVIL Take 1 tablet (100 mg total) by mouth at bedtime.   amLODipine 2.5 MG tablet Commonly known as: NORVASC Take 2.5 mg by mouth daily.   aspirin EC 81 MG tablet Take 81 mg by mouth daily.   finasteride 5 MG tablet Commonly known as: PROSCAR Take 1 tablet (5 mg total) by mouth every morning. What changed: when to take this   gabapentin 600 MG tablet Commonly known as: NEURONTIN Take 1 tablet (600 mg total) by mouth 3 (three) times daily. What changed:   medication strength  how much to take   HYDROcodone-acetaminophen 5-325 MG tablet Commonly known as: NORCO/VICODIN Take 1-2 tablets by mouth every 6 (six) hours as needed for up to 5 days for severe pain.   latanoprost 0.005 % ophthalmic solution Commonly known as: XALATAN Place 1 drop into both eyes at bedtime.   lidocaine 5 % Commonly known as: Lidoderm Place 1 patch onto the skin daily. Remove & Discard patch within 12 hours or as directed by MD   metoprolol tartrate 25 MG tablet Commonly known as: LOPRESSOR Take 0.5 tablets (12.5 mg total) by mouth 2 (two) times daily.   nitroGLYCERIN 0.4 MG SL tablet Commonly known as: NITROSTAT Place 1 tablet (0.4 mg total) under the tongue every 5 (five) minutes x 3 doses as needed for chest pain.   OXYGEN Place 4 L/min into the nose continuous.   Santyl ointment Generic drug: collagenase Apply  1 application topically daily. Apply to left buttock wound and bilateral heel ulcerations.   tamsulosin 0.4 MG Caps capsule Commonly known as: FLOMAX Take 1 capsule (0.4 mg total) by mouth at bedtime.   Trelegy Ellipta 200-62.5-25 MCG/INH Aepb Generic drug: Fluticasone-Umeclidin-Vilant Inhale 1 puff into the lungs daily.      Allergies  Allergen Reactions  . Codeine Itching  . Trileptal [Oxcarbazepine] Other (See Comments)    Abnormal sodium levels Hyponatremia   Follow-up Information    [name], MD.   Specialty: Family Medicine Contact information: [id number] [place] [address] [phone]        Carole Civil, MD.   Specialties: Orthopedic Surgery, Radiology Contact information: [address] [place] [address] [phone]           The results of significant diagnostics from this hospitalization (including imaging, microbiology, ancillary and laboratory) are listed below for reference.    Significant Diagnostic Studies: DG Chest 2 View  Result Date: 09/24/2019 CLINICAL DATA:  Pain status post fall EXAM: CHEST - 2 VIEW COMPARISON:  May 14, 2019 FINDINGS: Again noted are persistent diffuse patchy bilateral airspace opacification. December prior study and is favored be secondary to a combination atelectasis, scarring, and pleural based plaques. There is cardiomegaly. There are atherosclerotic changes of the thoracic aorta. There is no pneumothorax. IMPRESSION: No acute cardiopulmonary process. Stable cardiomegaly and bilateral calcified pleural based plaques as seen on the patient's prior CTs. Electronically Signed   By: Constance Holster M.D.   On: 09/24/2019 21:16   DG Thoracic Spine 2 View  Result Date: 09/24/2019 CLINICAL DATA:  Pain status post fall EXAM: LUMBAR SPINE - COMPLETE 4+ VIEW; THORACIC SPINE 2 VIEWS COMPARISON:  None. FINDINGS: For the purpose of this report, there are rudimentary ribs at the T12 level. There is probable there is an age-indeterminate compression fracture of the T12 vertebral body resulting in approximately 25-30% height loss. This fracture is new since 02/11/2019. There appears to be some mild height loss of the T11 vertebral body which is new since CT dated 02/11/2019. Evaluation of the mid upper thoracic spine is limited by osteopenia and overlapping osseous structures. Advanced vascular calcifications are noted throughout the abdominal aorta with an infrarenal abdominal aortic aneurysm measuring up to approximately 3.4 cm. There is diffuse osteopenia throughout the  visualized portions of the thoracolumbar spine with multilevel degenerative changes and facet arthrosis, especially at the lower lumbar segments. IMPRESSION: 1. Age-indeterminate compression fractures of the T11 and T12 vertebral bodies, new since 2020. 2. The midthoracic spine is suboptimally evaluated secondary to osteopenia and overlapping structures. 3. Infrarenal abdominal aortic aneurysm measuring up to approximately 3.4 cm. Recommend followup by ultrasound in 3 years. This recommendation follows ACR consensus guidelines: White Paper of the ACR Incidental Findings Committee II on Vascular Findings. J Am Coll Radiol 2013; 10:789-794 Aortic Atherosclerosis (ICD10-I70.0). Electronically Signed   By: Constance Holster M.D.   On: 09/24/2019 21:15   DG Lumbar Spine Complete  Result Date: 09/24/2019 CLINICAL DATA:  Pain status post fall EXAM: LUMBAR SPINE - COMPLETE 4+ VIEW; THORACIC SPINE 2 VIEWS COMPARISON:  None. FINDINGS: For the purpose of this report, there are rudimentary ribs at the T12 level. There is probable there is an age-indeterminate compression fracture of the T12 vertebral body resulting in approximately 25-30% height loss. This fracture is new since 02/11/2019. There appears to be some mild height loss of the T11 vertebral body which is new since  CT dated 02/11/2019. Evaluation of the mid upper thoracic spine is limited by osteopenia and overlapping osseous structures. Advanced vascular calcifications are noted throughout the abdominal aorta with an infrarenal abdominal aortic aneurysm measuring up to approximately 3.4 cm. There is diffuse osteopenia throughout the visualized portions of the thoracolumbar spine with multilevel degenerative changes and facet arthrosis, especially at the lower lumbar segments. IMPRESSION: 1. Age-indeterminate compression fractures of the T11 and T12 vertebral bodies, new since 2020. 2. The midthoracic spine is suboptimally evaluated secondary to osteopenia and  overlapping structures. 3. Infrarenal abdominal aortic aneurysm measuring up to approximately 3.4 cm. Recommend followup by ultrasound in 3 years. This recommendation follows ACR consensus guidelines: White Paper of the ACR Incidental Findings Committee II on Vascular Findings. J Am Coll Radiol 2013; 10:789-794 Aortic Atherosclerosis (ICD10-I70.0). Electronically Signed   By: Constance Holster M.D.   On: 09/24/2019 21:15   CT Head Wo Contrast  Result Date: 09/24/2019 CLINICAL DATA:  Status post fall with trauma to the head. EXAM: CT HEAD WITHOUT CONTRAST CT CERVICAL SPINE WITHOUT CONTRAST TECHNIQUE: Multidetector CT imaging of the head and cervical spine was performed following the standard protocol without intravenous contrast. Multiplanar CT image reconstructions of the cervical spine were also generated. COMPARISON:  August 20, 2017 FINDINGS: CT HEAD FINDINGS Brain: No evidence of acute infarction, hemorrhage, hydrocephalus, extra-axial collection or mass lesion/mass effect. Advanced brain parenchymal volume loss and deep white matter microangiopathy Vascular: Intracranial calcific atherosclerotic disease. Skull: Normal. Negative for fracture or focal lesion. Sinuses/Orbits: No acute finding. Other: None. CT CERVICAL SPINE FINDINGS Alignment: Normal. Skull base and vertebrae: No acute fracture. No primary bone lesion or focal pathologic process. Soft tissues and spinal canal: No prevertebral fluid or swelling. No visible canal hematoma. Disc levels: Multilevel osteoarthritic changes of the cervical spine. Upper chest: Right supraclavicular and superior mediastinal lymphadenopathy. Other: Linear and nodular pleural thickening and calcifications in the bilateral lung apices. Interstitial thickening. IMPRESSION: 1. No acute intracranial abnormality. 2. Advanced brain parenchymal atrophy and chronic microvascular disease. 3. No evidence of acute traumatic injury to cervical spine. 4. Multilevel osteoarthritic  changes of the cervical spine. 5. Bulky right supraclavicular and superior mediastinal lymphadenopathy. Please correlate clinically. 6. Linear and nodular pleural thickening and calcifications in the bilateral lung apices. Interstitial thickening. Electronically Signed   By: Fidela Salisbury M.D.   On: 09/24/2019 20:50   CT Cervical Spine Wo Contrast  Result Date: 09/24/2019 CLINICAL DATA:  Status post fall with trauma to the head. EXAM: CT HEAD WITHOUT CONTRAST CT CERVICAL SPINE WITHOUT CONTRAST TECHNIQUE: Multidetector CT imaging of the head and cervical spine was performed following the standard protocol without intravenous contrast. Multiplanar CT image reconstructions of the cervical spine were also generated. COMPARISON:  August 20, 2017 FINDINGS: CT HEAD FINDINGS Brain: No evidence of acute infarction, hemorrhage, hydrocephalus, extra-axial collection or mass lesion/mass effect. Advanced brain parenchymal volume loss and deep white matter microangiopathy Vascular: Intracranial calcific atherosclerotic disease. Skull: Normal. Negative for fracture or focal lesion. Sinuses/Orbits: No acute finding. Other: None. CT CERVICAL SPINE FINDINGS Alignment: Normal. Skull base and vertebrae: No acute fracture. No primary bone lesion or focal pathologic process. Soft tissues and spinal canal: No prevertebral fluid or swelling. No visible canal hematoma. Disc levels: Multilevel osteoarthritic changes of the cervical spine. Upper chest: Right supraclavicular and superior mediastinal lymphadenopathy. Other: Linear and nodular pleural thickening and calcifications in the bilateral lung apices. Interstitial thickening. IMPRESSION: 1. No acute intracranial abnormality. 2. Advanced brain parenchymal atrophy and chronic  microvascular disease. 3. No evidence of acute traumatic injury to cervical spine. 4. Multilevel osteoarthritic changes of the cervical spine. 5. Bulky right supraclavicular and superior mediastinal  lymphadenopathy. Please correlate clinically. 6. Linear and nodular pleural thickening and calcifications in the bilateral lung apices. Interstitial thickening. Electronically Signed   By: Fidela Salisbury M.D.   On: 09/24/2019 20:50   MR LUMBAR SPINE WO CONTRAST  Result Date: 09/27/2019 CLINICAL DATA:  Low back pain after a recent fall. EXAM: MRI LUMBAR SPINE WITHOUT CONTRAST TECHNIQUE: Multiplanar, multisequence MR imaging of the lumbar spine was performed. No intravenous contrast was administered. COMPARISON:  Lumbar spine radiographs 09/24/2019. CT abdomen and pelvis 02/08/2019. FINDINGS: The study is mildly motion degraded. Segmentation: Standard. Alignment: Mildly exaggerated lumbar lordosis. Trace anterolisthesis of L4 on L5. Vertebrae: There is a T12 compression fracture with 35% height loss, a visible fracture line including a fluid-filled fracture cleft, mild marrow edema, and 2 mm retropulsion of the posterior vertebral body. The T11 vertebral body and lumbar vertebral bodies are preserved in height. No suspicious osseous lesion is identified. A hemangioma is noted in the L1 vertebral body. Small Schmorl's nodes involve the T10 inferior and L1 superior endplates. Conus medullaris and cauda equina: Conus extends to the lower L1 level. Conus and cauda equina appear normal. Paraspinal and other soft tissues: Mild lower thoracic paravertebral soft tissue edema. 2 cm T2 hyperintense focus in the right kidney, likely a cyst. 3 cm abdominal aortic aneurysm, similar to the prior CT. Disc levels: T11-12: Minimal T12 retropulsion without stenosis. T12-L1: Negative. L1-2: Small left foraminal disc protrusion without stenosis. L2-3: Mild disc bulging and mild facet hypertrophy without stenosis. L3-4: Moderate disc space narrowing. Right eccentric disc bulging, endplate spurring, and mild facet hypertrophy result in borderline to mild right lateral recess stenosis and mild-to-moderate right neural foraminal  stenosis without spinal stenosis. L4-5: Trace anterolisthesis with minimal bulging of uncovered disc, congenitally short pedicles, and severe facet and ligamentum flavum hypertrophy result in moderate spinal stenosis, moderate bilateral lateral recess stenosis, and mild bilateral neural foraminal stenosis. The L5 nerve roots may be affected in the lateral recesses. L5-S1: Severe facet arthrosis without stenosis. IMPRESSION: 1. Recent/unhealed T12 compression fracture with 35% height loss. 2. Multilevel lumbar disc and facet degeneration, most notable at L4-5 where there is moderate spinal stenosis and lateral recess stenosis. 3. Known 3 cm abdominal aortic aneurysm. Electronically Signed   By: Logan Bores M.D.   On: 09/27/2019 13:50   IR KYPHO THORACIC WITH BONE BIOPSY  Result Date: 09/29/2019 INDICATION: 74 year old male with recent mechanical fall resulting in symptomatic T12 compression fracture presenting for T12 kyphoplasty/vertebroplasty. EXAM: FLUOROSCOPY GUIDED T12 CORE BONE BIOPSY AND BALLOON KYPHOPLASTY COMPARISON:  MRI of the lumbar spine Sep 27, 2018 MEDICATIONS: As antibiotic prophylaxis, Ancef 2 gm IV was ordered pre-procedure and administered intravenously within 1 hour of incision. ANESTHESIA/SEDATION: Moderate (conscious) sedation was employed during this procedure. A total of Versed 2 mg and Fentanyl 100 mcg was administered intravenously. Moderate Sedation Time: 50 minutes. The patient's level of consciousness and vital signs were monitored continuously by radiology nursing throughout the procedure under my direct supervision. FLUOROSCOPY TIME:  Fluoroscopy Time: 50 minutes (99991111 mGy) COMPLICATIONS: None immediate. PROCEDURE: Informed consent was obtained and placed patient's medical record. The patient was placed in prone position on the angiography table. The thoracic spine region was prepped and draped in a sterile fashion. Under fluoroscopy, the T12 vertebral body was delineated and the  skin area was  marked. The skin was infiltrated with a 1% Lidocaine approximately 4.5 cm lateral to the spinous process projection on the right. Using a 22-gauge spinal needle, the soft issue and the peripedicular space and periosteum were infiltrated with Bupivacaine 0.5%. A skin incision was made at the access site. Subsequently, an 11-gauge Kyphon trocar was inserted under fluoroscopic guidance until contact with the pedicle was obtained. The trocar was inserted under light hammer tapping into the pedicle until the posterior boundaries of the vertebral body was reached. The diamond mandrill was removed and one core biopsy wasobtained. The skin was infiltrated with a 1% Lidocaine approximately 4.5 cm lateral to the spinous process projection on the left. Using a 22-gauge spinal needle, the soft issue and the peripedicular space and periosteum were infiltrated with Bupivacaine %. A skin incision was made at the access site. Subsequently, an 11-gauge Kyphon trocar was inserted under fluoroscopic guidance until contact with the pedicle was obtained. The trocar was inserted under light hammer tapping into the pedicle until the posterior boundaries of the vertebral body was reached. The diamond mandrill was removed. A bone drill was coaxially advanced within the anterior third of the vertebral body and then exchanged for inflatable Kyphon balloons. These were centered within the mid-aspect of the vertebral body. The balloons were inflated to create a void to serve as a repository for the bone cement. Both balloons were deflated and through both cannulas, under continuous fluoroscopy guidance in the AP and lateral views, the vertebral body was filled with previously mixed polymethyl-methacrylate (PMMA) added to barium for opacification. Both cannulas were later removed. The access sites were cleaned and covered with a sterile bandage. IMPRESSION: Successful fluoroscopy-guided bilateral transpedicular approach for T12  vertebral body core bone biopsy and kyphoplasty for treatment of osteoporotic fragility fracture. Bone samples obtained were sent for pathology analysis. Electronically Signed   By: Pedro Earls M.D.   On: 09/29/2019 15:28   DG Hips Bilat W or Wo Pelvis 3-4 Views  Result Date: 09/24/2019 CLINICAL DATA:  Pain status post fall EXAM: DG HIP (WITH OR WITHOUT PELVIS) 3-4V BILAT COMPARISON:  December 9th 2020. FINDINGS: The patient has undergone prior intramedullary nail placement through the left femur. The hardware is intact. There is no evidence for an acute displaced fracture or dislocation. Atherosclerotic changes are noted. IMPRESSION: No acute displaced fracture or dislocation. Electronically Signed   By: Constance Holster M.D.   On: 09/24/2019 21:17    Microbiology: Recent Results (from the past 240 hour(s))  SARS Coronavirus 2 by RT PCR (hospital order, performed in [facility] hospital lab) Nasopharyngeal Nasopharyngeal Swab     Status: None   Collection Time: 09/25/19  1:28 AM   Specimen: Nasopharyngeal Swab  Result Value Ref Range Status   SARS Coronavirus 2 NEGATIVE NEGATIVE Final    Comment: (NOTE) SARS-CoV-2 target nucleic acids are NOT DETECTED. The SARS-CoV-2 RNA is generally detectable in upper and lower respiratory specimens during the acute phase of infection. The lowest concentration of SARS-CoV-2 viral copies this assay can detect is 250 copies / mL. A negative result does not preclude SARS-CoV-2 infection and should not be used as the sole basis for treatment or other patient management decisions.  A negative result may occur with improper specimen collection / handling, submission of specimen other than nasopharyngeal swab, presence of viral mutation(s) within the areas targeted by this assay, and inadequate number of viral copies (<250 copies / mL). A negative result must be combined with clinical  observations, patient history, and epidemiological  information. Fact Sheet for Patients:   StrictlyIdeas.no Fact Sheet for Healthcare Providers: BankingDealers.co.za This test is not yet approved or cleared  by the [place] FDA and has been authorized for detection and/or diagnosis of SARS-CoV-2 by FDA under an Emergency Use Authorization (EUA).  This EUA will remain in effect (meaning this test can be used) for the duration of the COVID-19 declaration under Section 564(b)(1) of the Act, 21 U.S.C. section 360bbb-3(b)(1), unless the authorization is terminated or revoked sooner. Performed at [facility], [address], [place], [place] [address]   MRSA PCR Screening     Status: Abnormal   Collection Time: 09/28/19 11:05 AM   Specimen: Nasopharyngeal  Result Value Ref Range Status   MRSA by PCR POSITIVE (A) NEGATIVE Final    Comment:        The GeneXpert MRSA Assay (FDA approved for NASAL specimens only), is one component of a comprehensive MRSA colonization surveillance program. It is not intended to diagnose MRSA infection nor to guide or monitor treatment for MRSA infections. BENGTSON P. AT 1350 ON [id number] BY THOMPSON S. Performed at [facility], [address], [place], [place] [address]      Labs: Basic Metabolic Panel: Recent Labs  Lab 09/24/19 2215 09/26/19 0546 09/27/19 0605 09/28/19 0546 09/30/19 0530  NA 140 138 137 135 135  K 5.1 4.0 4.1 3.9 4.1  CL 96* 95* 93* 93* 94*  CO2 32 34* 33* 33* 31  GLUCOSE 122* 89 88 92 98  BUN 22 32* 33* 27* 28*  CREATININE 1.59* 1.46* 1.37* 1.36* 1.41*  CALCIUM 10.4* 9.7 9.7 9.5 9.5   Liver Function Tests: Recent Labs  Lab 09/24/19 2215  AST 19  ALT 12  ALKPHOS 101  BILITOT 0.4  PROT 8.5*  ALBUMIN 3.2*   CBC: Recent Labs  Lab 09/24/19 2215 09/26/19 0546 09/27/19 0605 09/28/19 0546  WBC 12.1* 10.0 6.7 7.2  NEUTROABS 10.6*  --   --   --   HGB 10.7* 9.7* 9.7* 9.1*  HCT 36.3* 32.3* 32.7* 30.3*  MCV 88.1 86.4  88.9 87.3  PLT 349 328 316 269   BNP (last 3 results) Recent Labs    01/01/19 2327 04/13/19 0027  BNP 885.0* 755.0*    CBG: Recent Labs  Lab 09/25/19 0859  GLUCAP 157*   Signed:  Barton Dubois MD.  [facility] 09/30/2019, 1:37 PM

## 2019-09-30 NOTE — Progress Notes (Addendum)
PT Cancellation Note  Patient Details Name: Curtis [name] MRN: [id number] DOB: May 18, 1945   Cancelled Treatment:    Reason Eval/Treat Not Completed: Patient declined. Patient did perform rolling and sidelying to sit and return with supervision prior to refusal for all other mobility. Patient is refusing all mobility out of bed until he speaks with his physician. He has refused mobility x2 days in a row. He is currently on 6 LPM O2 and refusing mobilty until it is returned to 4 LPM.     Pamala Hurry D. Hartnett-Rands, MS, PT Per [place] [phone] 09/30/2019, 10:12 AM

## 2019-09-30 NOTE — Care Management Important Message (Signed)
Important Message  Patient Details  Name: Curtis [name] MRN: [id number] Date of Birth: April 03, 1946   Medicare Important Message Given:  Yes     Tommy Medal 09/30/2019, 2:06 PM

## 2019-09-30 NOTE — Plan of Care (Signed)
  Problem: Education: Goal: Knowledge of General Education information will improve Description: Including pain rating scale, medication(s)/side effects and non-pharmacologic comfort measures Outcome: Progressing   Problem: Activity: Goal: Risk for activity intolerance will decrease Outcome: Progressing   Problem: Nutrition: Goal: Adequate nutrition will be maintained Outcome: Progressing   Problem: Coping: Goal: Level of anxiety will decrease Outcome: Progressing   

## 2019-09-30 NOTE — TOC Progression Note (Signed)
Transition of Care [facility]) - Progression Note    Patient Details  Name: NAUTICA KREUTZER MRN: [id number] Date of Birth: 11-16-1945  Transition of Care [facility]) CM/SW Contact  Ihor Gully, LCSW Phone Number: 09/30/2019, 4:16 PM  Clinical Narrative:    Judson Roch with [facility] advised of discharge. RN/PT orders are in.   Expected Discharge Plan: Home/Self Care Barriers to Discharge: Continued Medical Work up  Expected Discharge Plan and Services Expected Discharge Plan: Home/Self Care In-house Referral: Clinical Social Work     Living arrangements for the past 2 months: Single Family Home Expected Discharge Date: 09/30/19                                     Social Determinants of Health (SDOH) Interventions    Readmission Risk Interventions Readmission Risk Prevention Plan 09/26/2019 02/11/2019 01/04/2019  Transportation Screening Complete Complete Complete  PCP or Specialist Appt within 5-7 Days - - -  PCP or Specialist Appt within 3-5 Days - - Complete  Home Care Screening - - -  Medication Review (RN CM) - - -  [place] or [place] - - Complete  Social Work Consult for [place] Planning/Counseling - - Complete  Palliative Care Screening - - Not Applicable  Medication Review (RN Care Manager) Complete Complete Complete  PCP or Specialist appointment within 3-5 days of discharge Not Complete Not Complete -  PCP/Specialist Appt Not Complete comments will complete at d/c. Pt returning to SNF -  [place] or Home Care Consult Complete Not Complete -  [place] or Home Care Consult Pt Refusal Comments - Pt returning to SNF -  SW Recovery Care/Counseling Consult Not Complete Complete -  Palliative Care Screening Not Applicable Not Applicable -  [place] Not Complete Complete -  SNF Comments PT eval pending - -  Some recent data might be hidden

## 2019-09-30 NOTE — Progress Notes (Signed)
OT Cancellation Note  Patient Details Name: Curtis [name] MRN: [id number] DOB: Jun 15, 1945   Cancelled Treatment:    Reason Eval/Treat Not Completed: Patient refused participation today. Pt reports he is going to complete his therapy at home.   Guadelupe Sabin, OTR/L  [phone] 09/30/2019, 7:51 AM

## 2019-09-30 NOTE — Progress Notes (Signed)
Nsg Discharge Note  Admit Date:  09/24/2019 Discharge date: 09/30/2019   Curtis [name] to be D/C'd Home per MD order.  AVS completed.  Copy for chart, and copy for patient signed, and dated.Removed IV-clean, dry, intact. Reviewed d/c paperwork with patient and son. Answered all questions. Wheeled stable patient and belongings to main entrance where he was picked up by his son to d/c to home. Patient/caregiver able to verbalize understanding.  Discharge Medication: Allergies as of 09/30/2019      Reactions   Codeine Itching   Trileptal [oxcarbazepine] Other (See Comments)   Abnormal sodium levels Hyponatremia      Medication List    STOP taking these medications   budesonide-formoterol 160-4.5 MCG/ACT inhaler Commonly known as: SYMBICORT   enoxaparin 40 MG/0.4ML injection Commonly known as: LOVENOX   multivitamin with minerals tablet     TAKE these medications   albuterol 108 (90 Base) MCG/ACT inhaler Commonly known as: VENTOLIN HFA 2 puffs every 4 - 6 hours as needed. This is your rescue inhaler   ALPRAZolam 1 MG tablet Commonly known as: XANAX Take 1 tablet (1 mg total) by mouth 2 (two) times daily as needed for anxiety or sleep. What changed: how much to take   amitriptyline 100 MG tablet Commonly known as: ELAVIL Take 1 tablet (100 mg total) by mouth at bedtime.   amLODipine 2.5 MG tablet Commonly known as: NORVASC Take 2.5 mg by mouth daily.   aspirin EC 81 MG tablet Take 81 mg by mouth daily.   finasteride 5 MG tablet Commonly known as: PROSCAR Take 1 tablet (5 mg total) by mouth every morning. What changed: when to take this   gabapentin 600 MG tablet Commonly known as: NEURONTIN Take 1 tablet (600 mg total) by mouth 3 (three) times daily. What changed:   medication strength  how much to take   HYDROcodone-acetaminophen 5-325 MG tablet Commonly known as: NORCO/VICODIN Take 1-2 tablets by mouth every 6 (six) hours as needed for up to 5 days for  severe pain.   latanoprost 0.005 % ophthalmic solution Commonly known as: XALATAN Place 1 drop into both eyes at bedtime.   lidocaine 5 % Commonly known as: Lidoderm Place 1 patch onto the skin daily. Remove & Discard patch within 12 hours or as directed by MD   metoprolol tartrate 25 MG tablet Commonly known as: LOPRESSOR Take 0.5 tablets (12.5 mg total) by mouth 2 (two) times daily.   nitroGLYCERIN 0.4 MG SL tablet Commonly known as: NITROSTAT Place 1 tablet (0.4 mg total) under the tongue every 5 (five) minutes x 3 doses as needed for chest pain.   OXYGEN Place 4 L/min into the nose continuous.   Santyl ointment Generic drug: collagenase Apply 1 application topically daily. Apply to left buttock wound and bilateral heel ulcerations.   tamsulosin 0.4 MG Caps capsule Commonly known as: FLOMAX Take 1 capsule (0.4 mg total) by mouth at bedtime.   Trelegy Ellipta 200-62.5-25 MCG/INH Aepb Generic drug: Fluticasone-Umeclidin-Vilant Inhale 1 puff into the lungs daily.       Discharge Assessment: Vitals:   09/30/19 0538 09/30/19 0906  BP: 116/61   Pulse: 79   Resp: 17   Temp: 98.7 F (37.1 C)   SpO2: 97% 91%   Skin clean, dry and intact without evidence of skin break down, no evidence of skin tears noted. IV catheter discontinued intact. Site without signs and symptoms of complications - no redness or edema noted at insertion site, patient  denies c/o pain - only slight tenderness at site.  Dressing with slight pressure applied.  D/c Instructions-Education: Discharge instructions given to patient/family with verbalized understanding. D/c education completed with patient/family including follow up instructions, medication list, d/c activities limitations if indicated, with other d/c instructions as indicated by MD - patient able to verbalize understanding, all questions fully answered. Patient instructed to return to ED, call 911, or call MD for any changes in condition.   Patient escorted via [place], and D/C home via private auto.  Santa Lighter, RN 09/30/2019 7:43 PM

## 2019-10-04 LAB — SURGICAL PATHOLOGY

## 2019-10-10 ENCOUNTER — Telehealth: Payer: Self-pay | Admitting: Orthopedic Surgery

## 2019-10-10 NOTE — Telephone Encounter (Signed)
Patient's son Elta Guadeloupe called and wanted to know if he needed to make an appointment for his father.  Elta Guadeloupe states his father had a procedure done on his back and that they were told that they fixed the new problem but cant do anything with the old problem.  Apparently Curtis [name] has had back pain for many years.  He has also had hip surgery done by Dr. Lyla Glassing done this last December.  After speaking for a while with Mr Godwin and son Elta Guadeloupe, they have decided to follow with the PCP and possibly Dr Lyla Glassing if Mr. Lasecki develops any hip pain.

## 2019-12-04 DEATH — deceased

## 2020-02-19 IMAGING — DX DG CHEST 1V
2 series · 2 of 2 positions shown · non-contrast
Comparison: January 01, 2019

CLINICAL DATA: 73-year-old male with a history of cough malaise and
fever

EXAM:
CHEST  1 VIEW

[chest ap (1 of 2)]
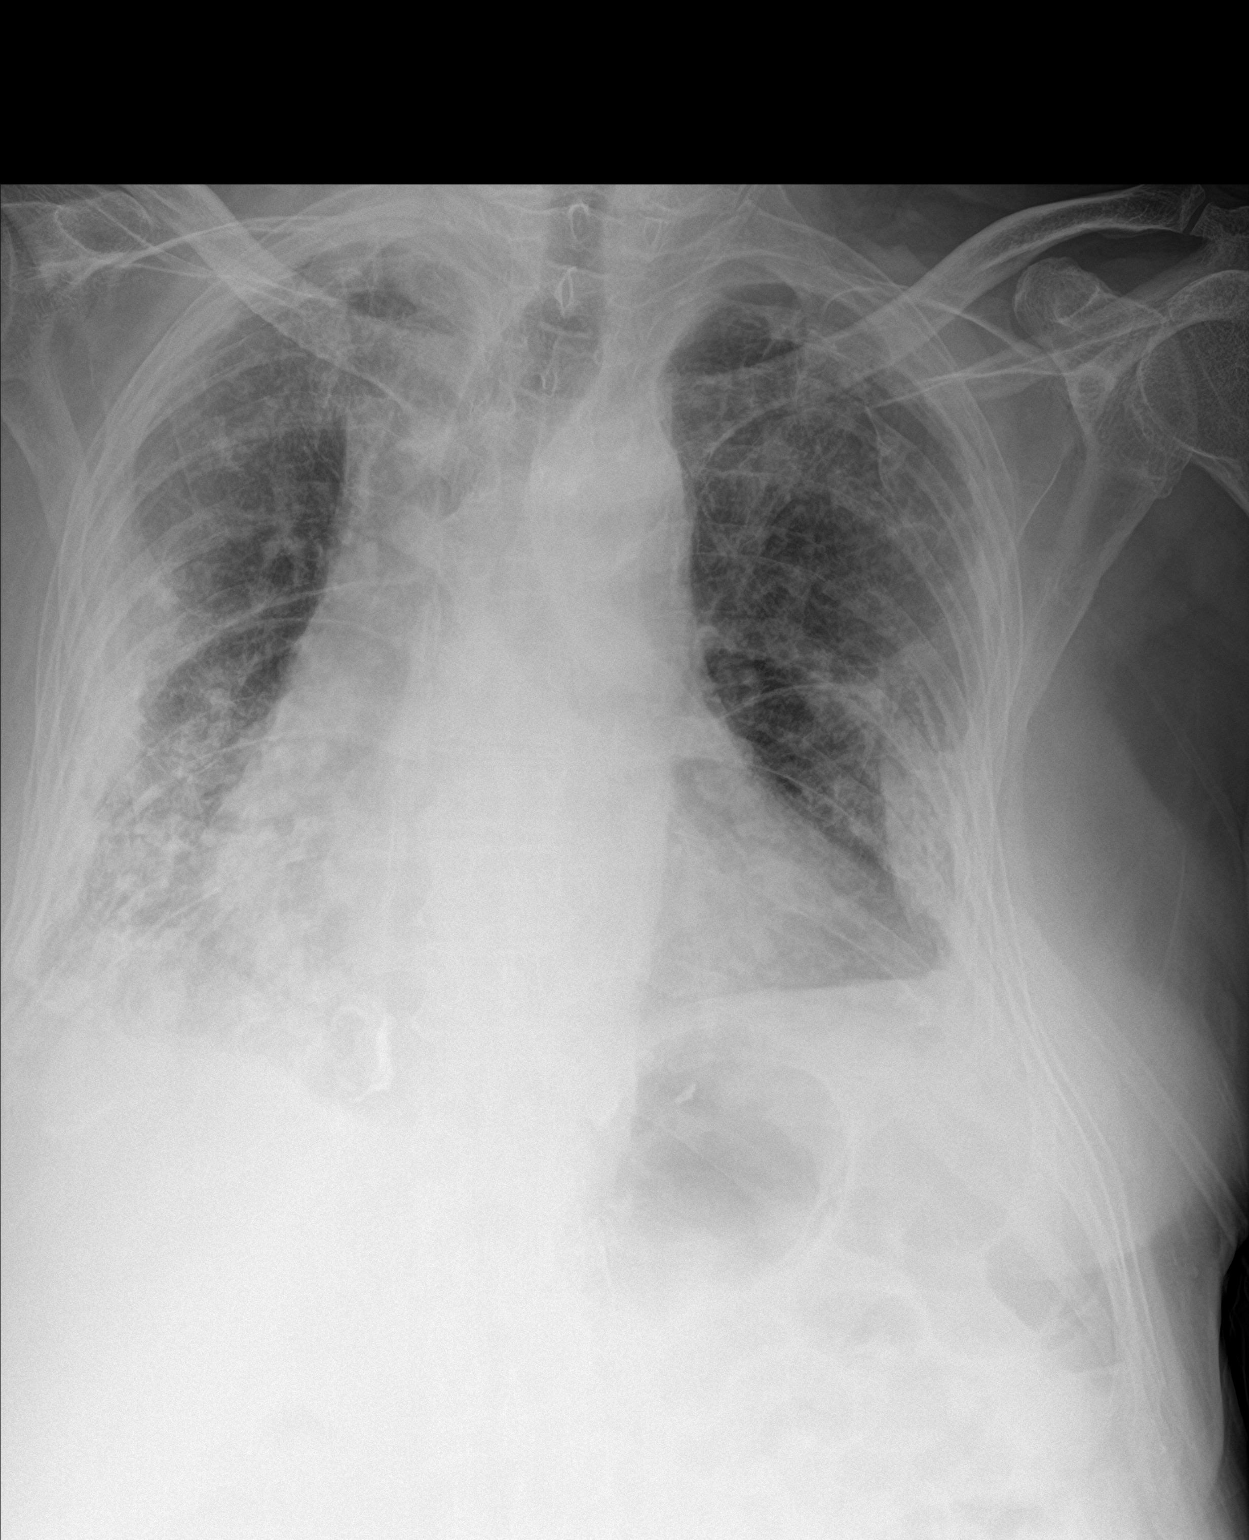

[chest ap (2 of 2)]
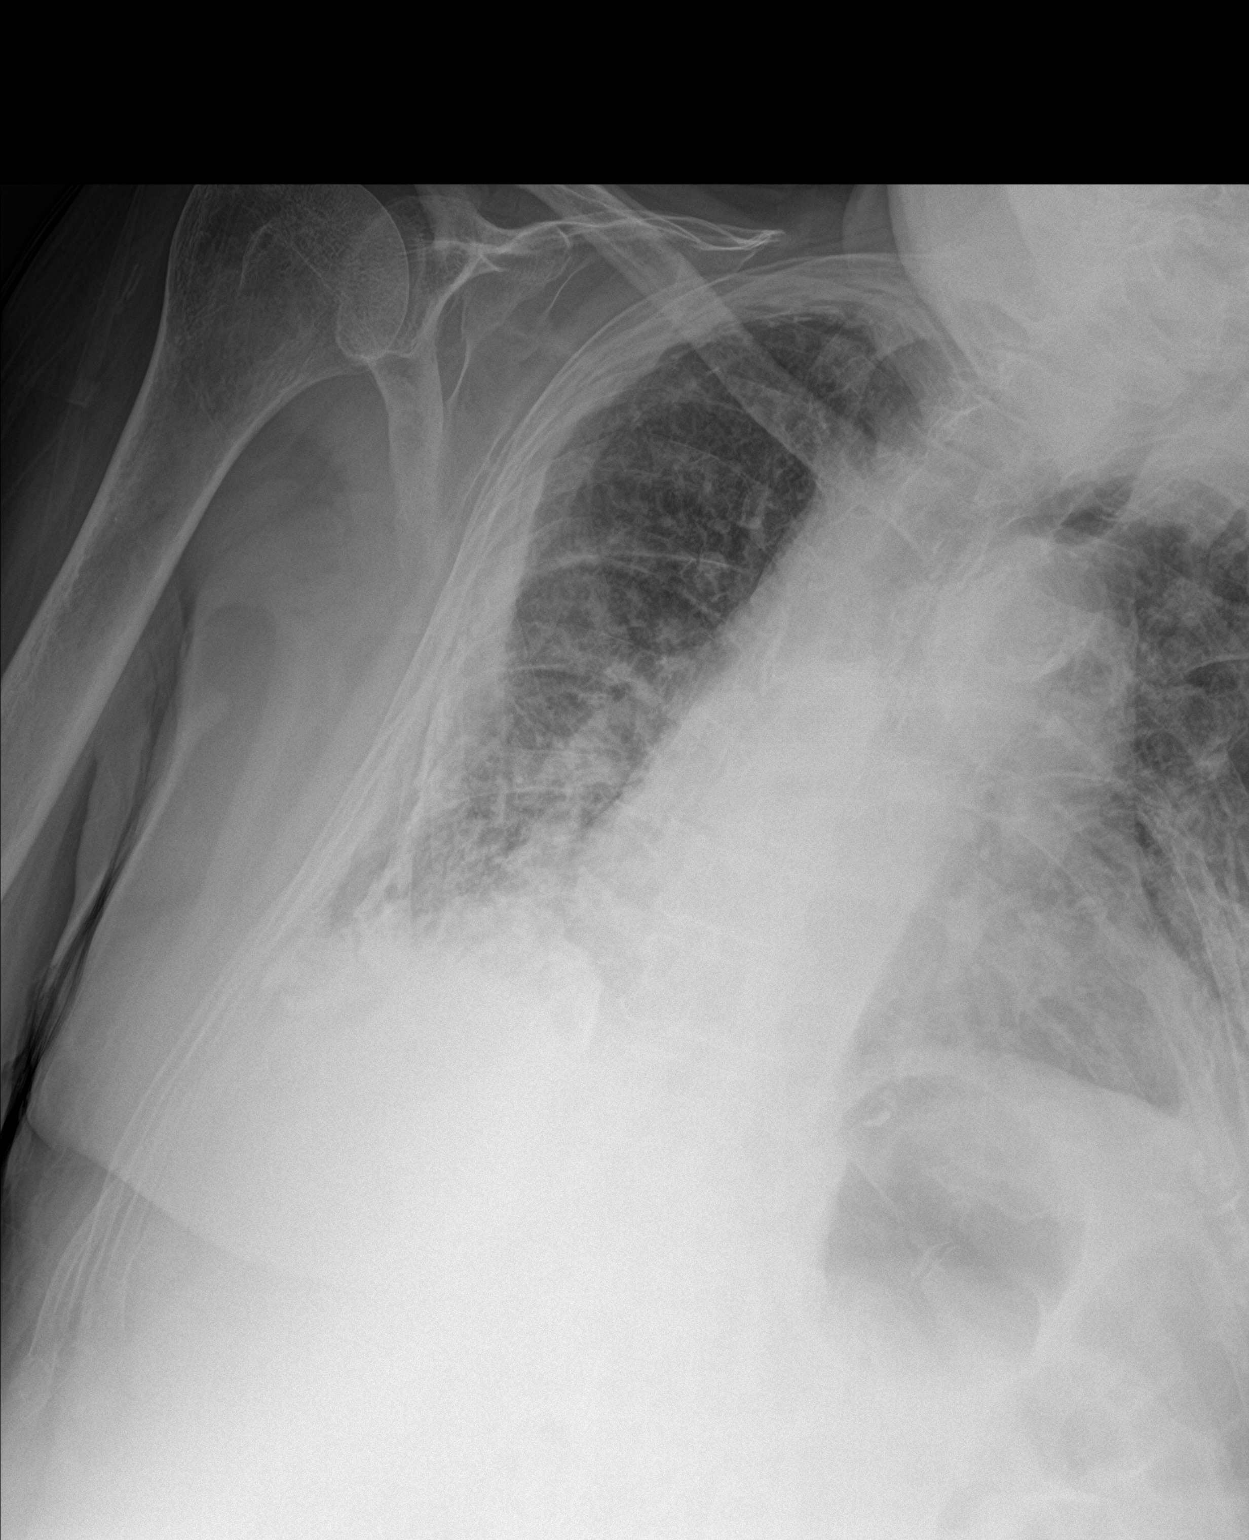

[2 of 2 positions shown; findings below may reference images not displayed]

FINDINGS: Cardiomediastinal silhouette unchanged in size and contour. Similar
appearance of diffuse reticulonodular opacities of lungs, with
similar appearance over the course of several comparison chest
x-rays. There has been slight progression since the chest x-ray of
February 2015.

No pneumothorax.

No acute displaced fracture
IMPRESSION: Similar appearance of reticulonodular opacities in the mid and lower
lungs, which has progressed slowly from 7875 on chest x-ray exams.
While the changes most likely represent chronic interstitial
fibrosis/scarring, superimposed infection cannot be excluded
radiographically.

## 2020-02-26 IMAGING — DX DG CHEST 2V
2 series · 3 of 3 positions shown · non-contrast
Comparison: 01/28/2019

CLINICAL DATA: COPD

EXAM:
CHEST - 2 VIEW

[chest ap]
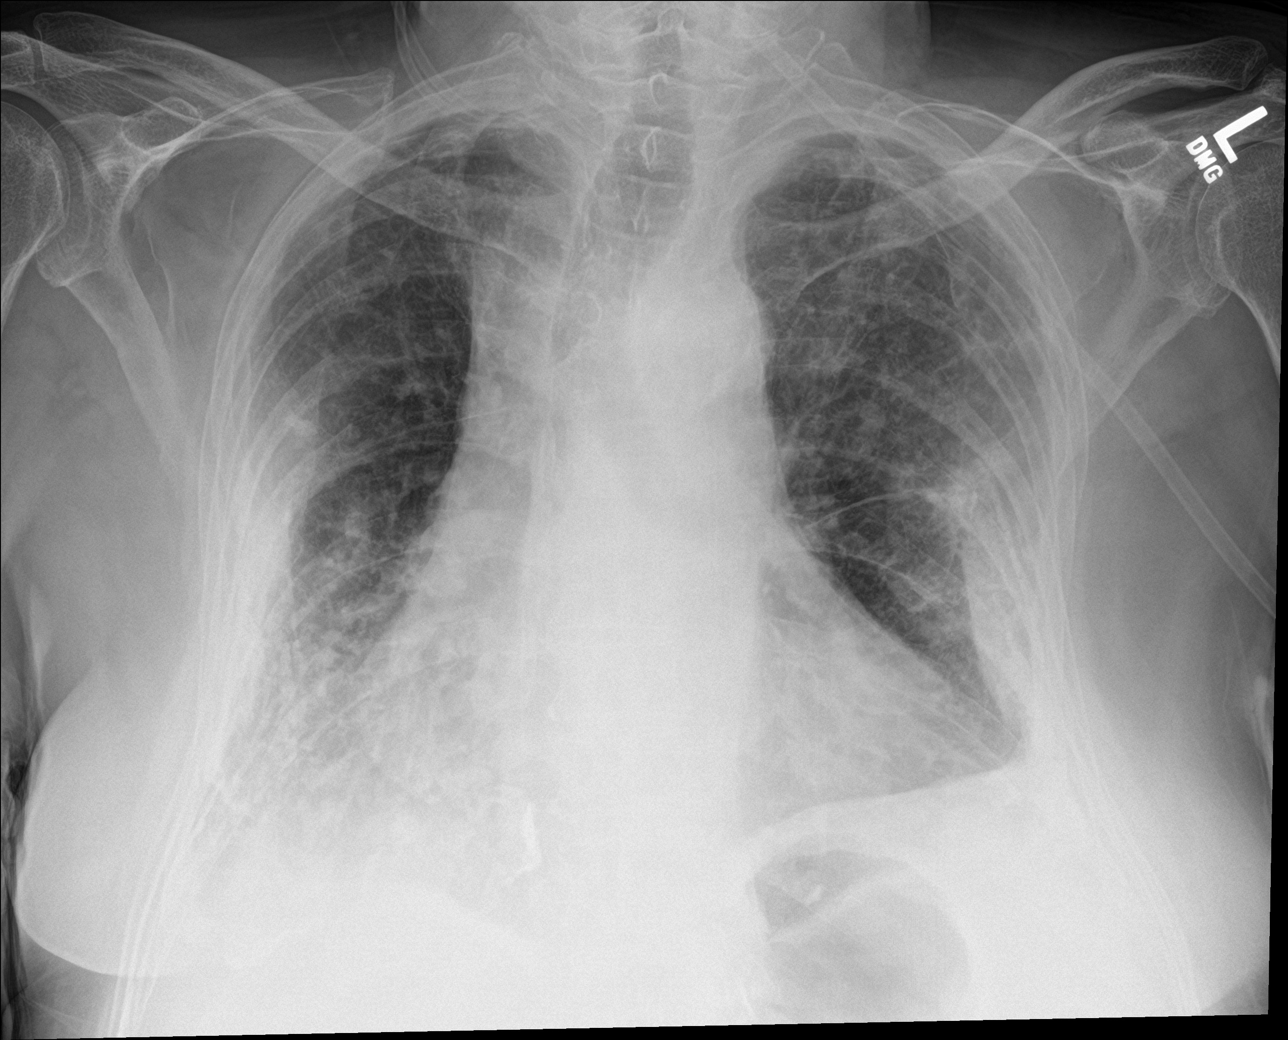

[Series 4: chest lat · 0.14mm/px · 2 of 2 slices shown]
[im 1/2]
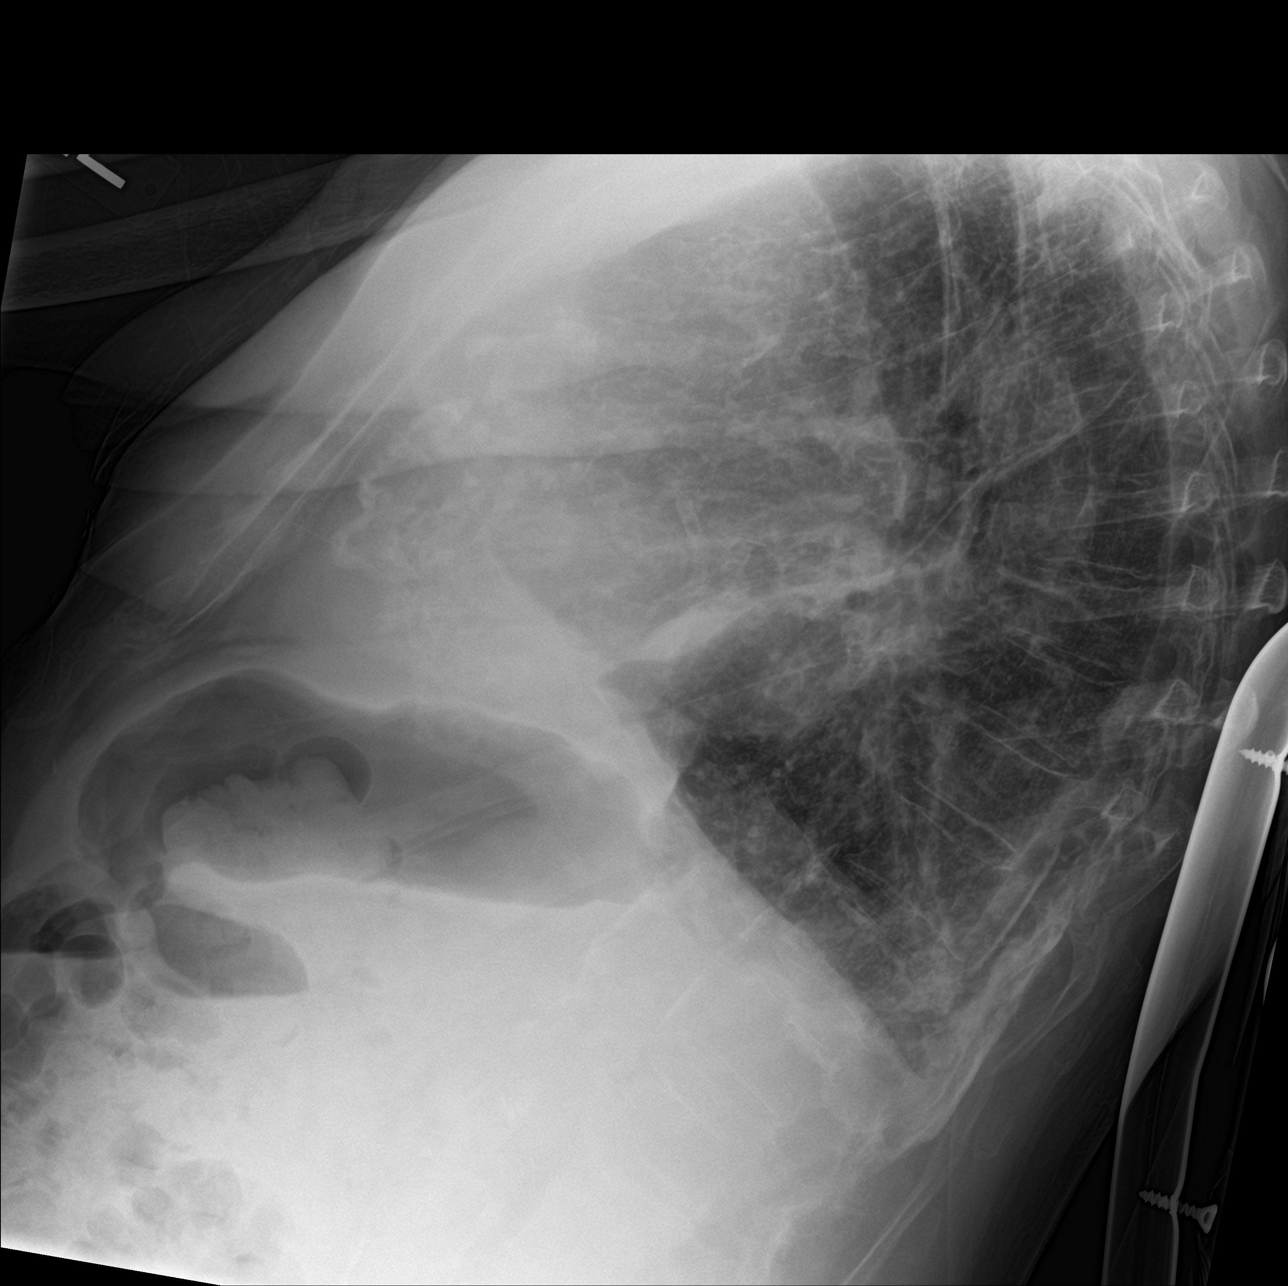
[im 2/2]
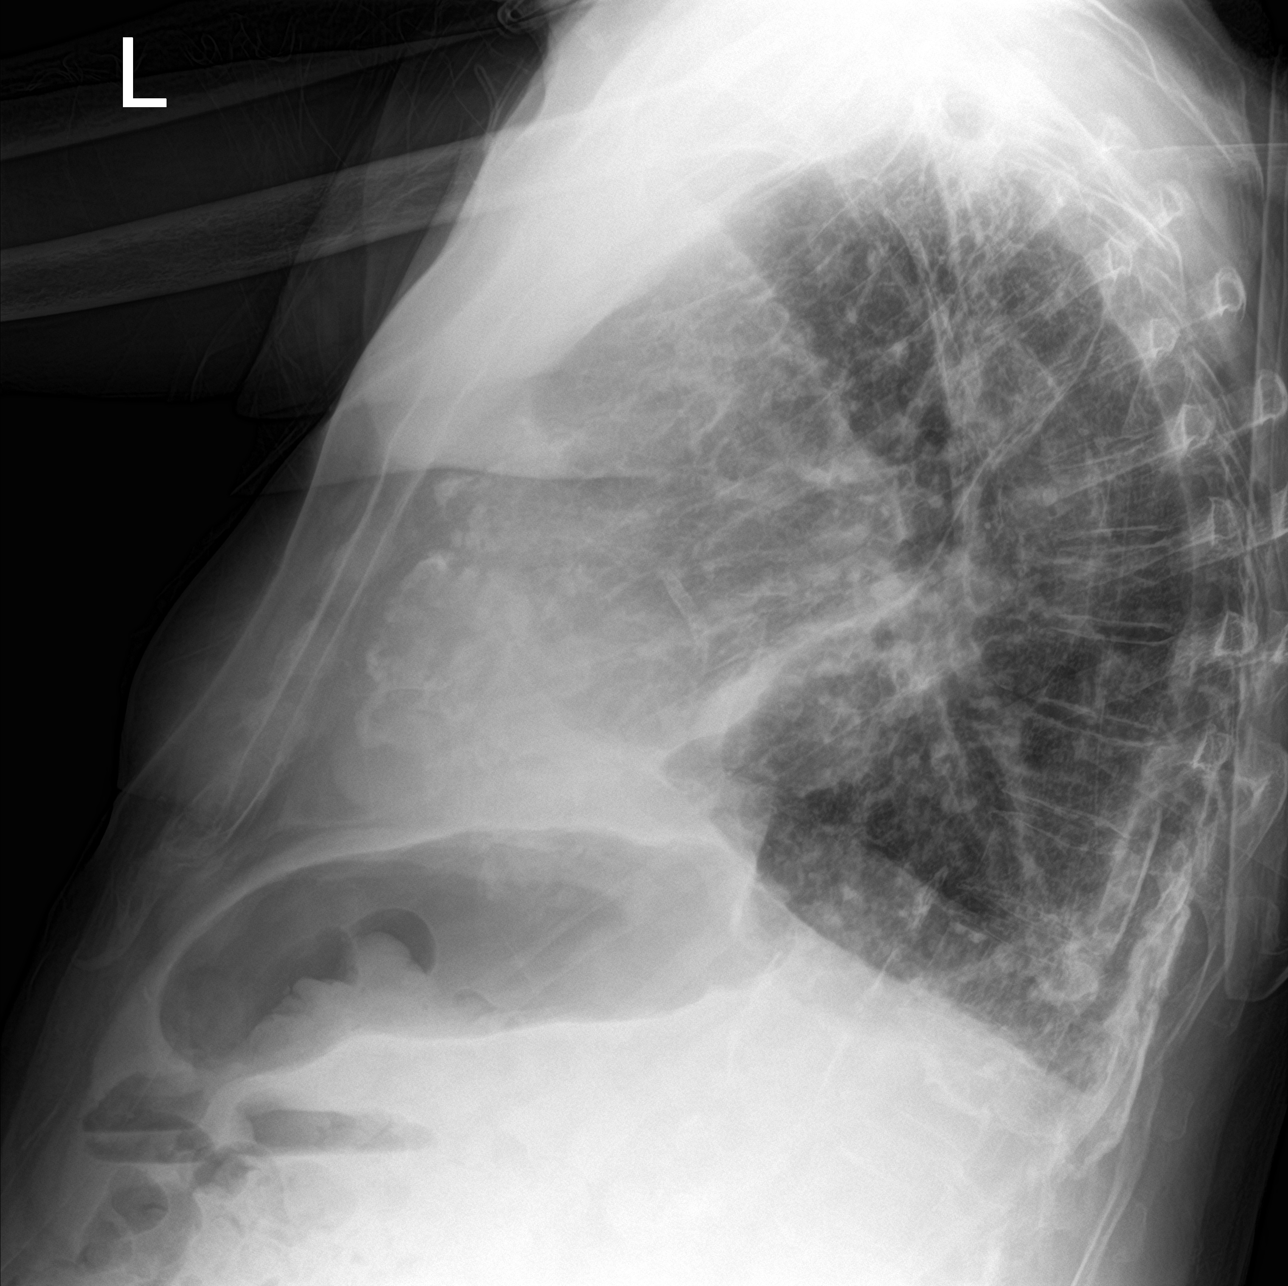

[3 of 3 positions shown; findings below may reference images not displayed]

FINDINGS: Cardiomegaly. Calcified pleural plaques bilaterally. There is
hyperinflation of the lungs compatible with COPD. Reticulonodular
opacities throughout the lungs, favor scarring, stable. No visible
effusions or acute bony abnormality.
IMPRESSION: Extensive bilateral calcified pleural plaques. Chronic increased
markings throughout the lungs, favor scarring. No definite acute
process.

## 2020-04-13 ENCOUNTER — Ambulatory Visit (HOSPITAL_COMMUNITY): Payer: Medicare Other

## 2020-10-15 IMAGING — DX DG HIP (WITH OR WITHOUT PELVIS) 3-4V BILAT
5 series · 5 of 5 positions shown · non-contrast
Comparison: [REDACTED] 7373.

CLINICAL DATA: Pain status post fall

EXAM:
DG HIP (WITH OR WITHOUT PELVIS) 3-4V BILAT

[pelvis ap]
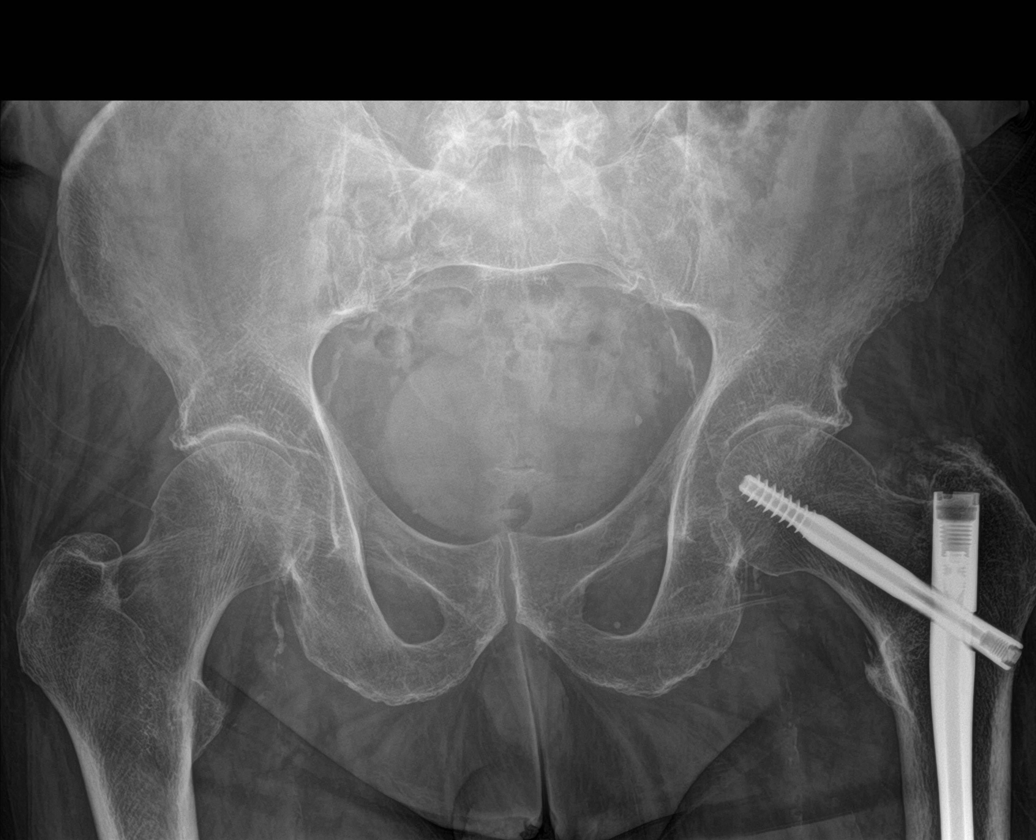

[hip ap (1 of 2)]
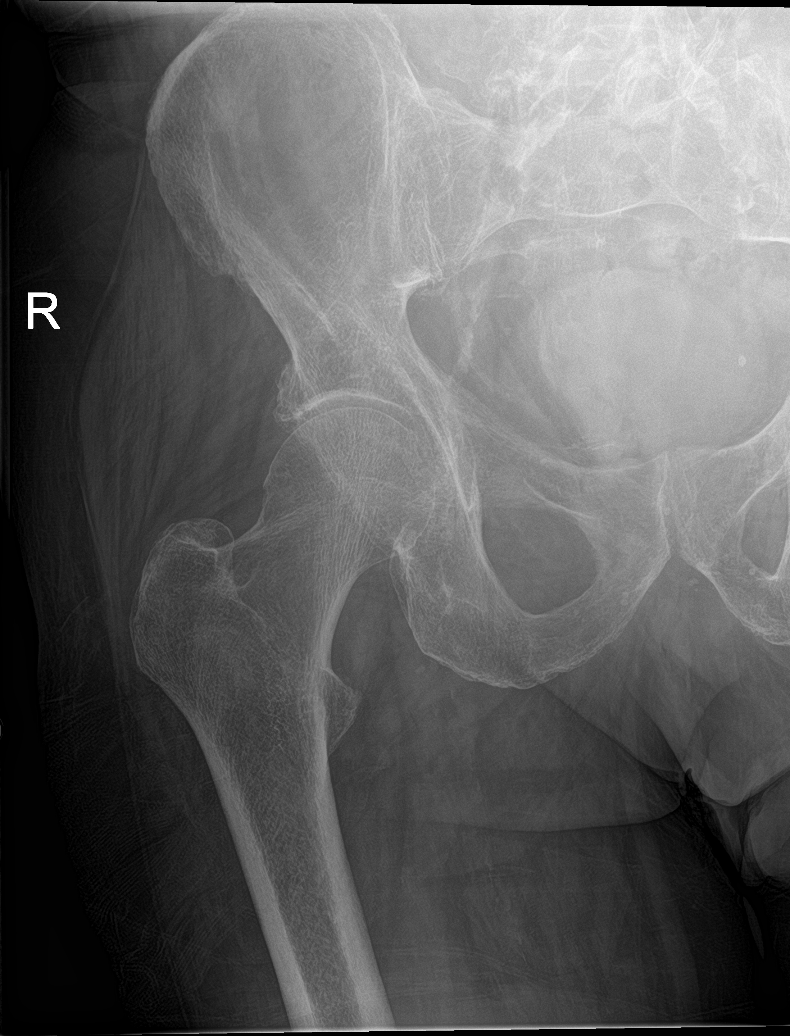

[hip lat (1 of 2)]
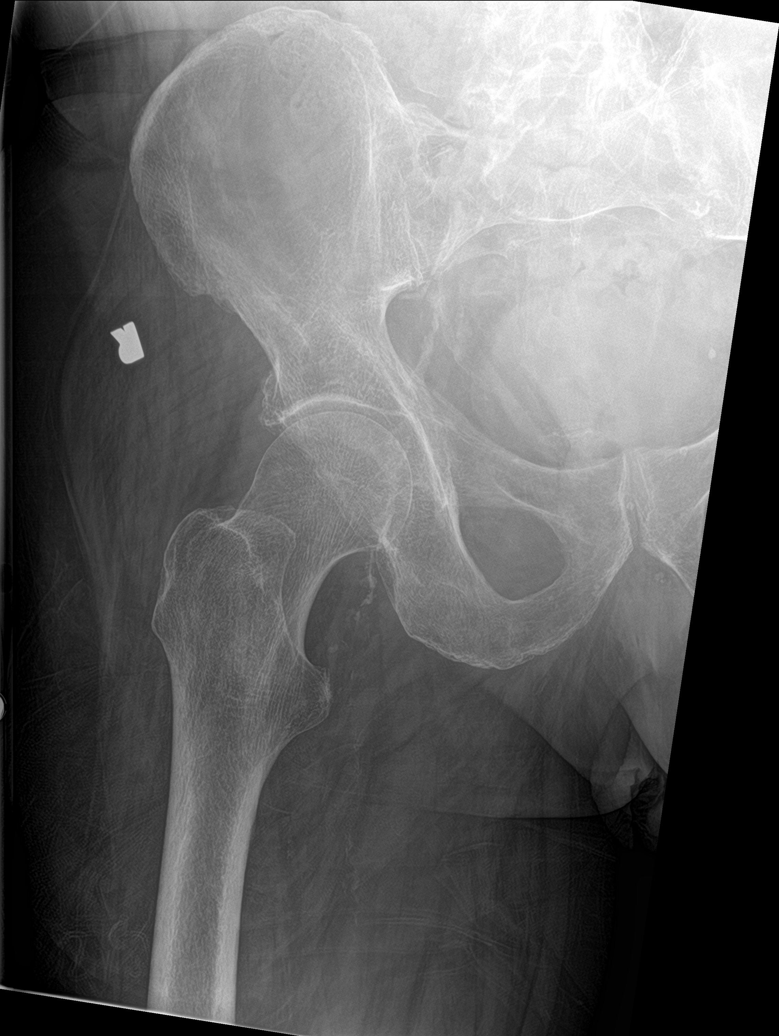

[hip ap (2 of 2)]
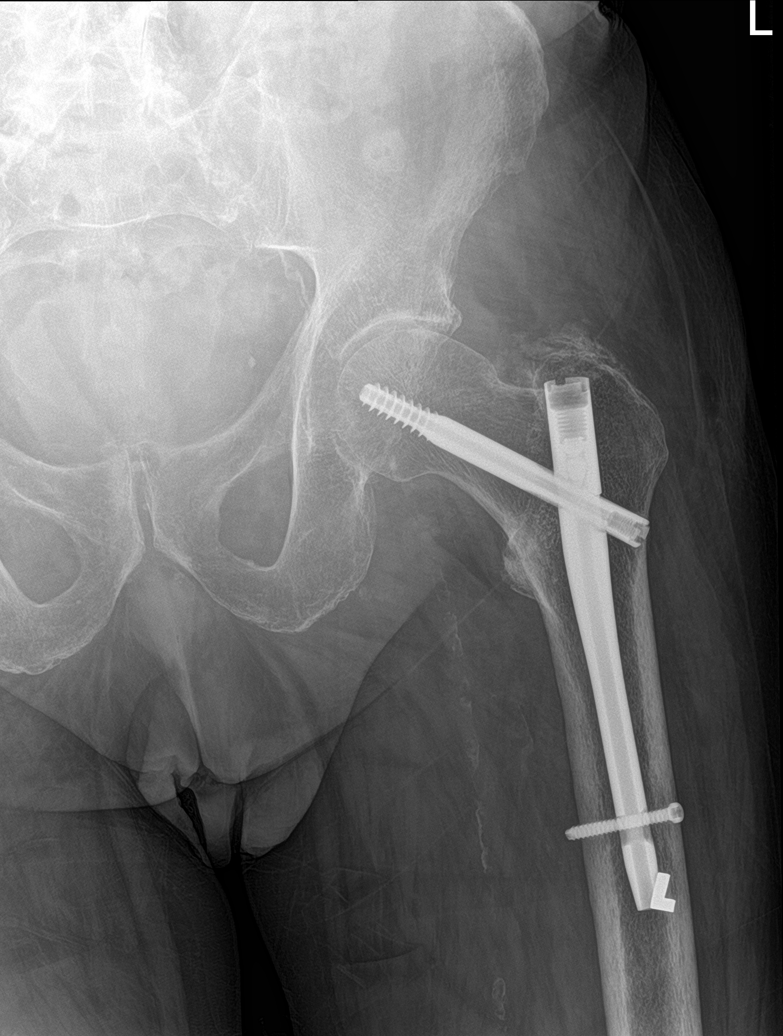

[hip lat (2 of 2)]
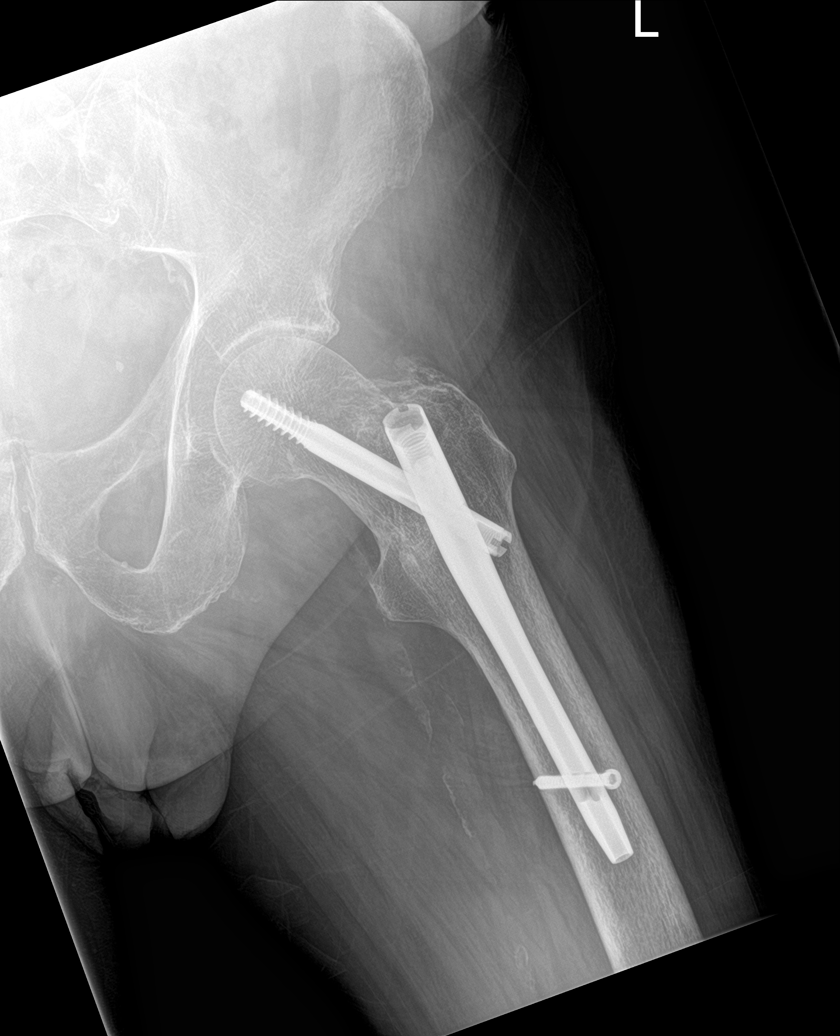

[5 of 5 positions shown; findings below may reference images not displayed]

FINDINGS: The patient has undergone prior intramedullary nail placement
through the left femur. The hardware is intact. There is no evidence
for an acute displaced fracture or dislocation. Atherosclerotic
changes are noted.
IMPRESSION: No acute displaced fracture or dislocation.

## 2022-07-03 ENCOUNTER — Encounter: Payer: Self-pay | Admitting: Radiology
